# Patient Record
Sex: Female | Born: 1950
Health system: Southern US, Community
[De-identification: ages and names within clinical notes are randomized; demographics above are authoritative.]

## PROBLEM LIST (undated history)

## (undated) DIAGNOSIS — F329 Major depressive disorder, single episode, unspecified: Secondary | ICD-10-CM

## (undated) DIAGNOSIS — G610 Guillain-Barre syndrome: Secondary | ICD-10-CM

## (undated) DIAGNOSIS — M1990 Unspecified osteoarthritis, unspecified site: Secondary | ICD-10-CM

## (undated) DIAGNOSIS — Z860100 Personal history of colon polyps, unspecified: Secondary | ICD-10-CM

## (undated) DIAGNOSIS — R3989 Other symptoms and signs involving the genitourinary system: Secondary | ICD-10-CM

## (undated) DIAGNOSIS — M545 Low back pain, unspecified: Secondary | ICD-10-CM

## (undated) DIAGNOSIS — I10 Essential (primary) hypertension: Secondary | ICD-10-CM

## (undated) DIAGNOSIS — R102 Pelvic and perineal pain: Secondary | ICD-10-CM

## (undated) DIAGNOSIS — E785 Hyperlipidemia, unspecified: Secondary | ICD-10-CM

## (undated) DIAGNOSIS — K5730 Diverticulosis of large intestine without perforation or abscess without bleeding: Secondary | ICD-10-CM

## (undated) DIAGNOSIS — K649 Unspecified hemorrhoids: Secondary | ICD-10-CM

## (undated) DIAGNOSIS — F419 Anxiety disorder, unspecified: Secondary | ICD-10-CM

## (undated) DIAGNOSIS — Z8659 Personal history of other mental and behavioral disorders: Secondary | ICD-10-CM

## (undated) DIAGNOSIS — N3020 Other chronic cystitis without hematuria: Secondary | ICD-10-CM

## (undated) DIAGNOSIS — Z8742 Personal history of other diseases of the female genital tract: Secondary | ICD-10-CM

## (undated) DIAGNOSIS — Z973 Presence of spectacles and contact lenses: Secondary | ICD-10-CM

## (undated) DIAGNOSIS — M8580 Other specified disorders of bone density and structure, unspecified site: Secondary | ICD-10-CM

## (undated) DIAGNOSIS — G8929 Other chronic pain: Secondary | ICD-10-CM

## (undated) DIAGNOSIS — F32A Depression, unspecified: Secondary | ICD-10-CM

## (undated) HISTORY — PX: OTHER SURGICAL HISTORY: SHX169

## (undated) HISTORY — DX: Other specified disorders of bone density and structure, unspecified site: M85.80

## (undated) HISTORY — DX: Depression, unspecified: F32.A

## (undated) HISTORY — DX: Hyperlipidemia, unspecified: E78.5

## (undated) HISTORY — DX: Major depressive disorder, single episode, unspecified: F32.9

## (undated) HISTORY — PX: CARPAL TUNNEL RELEASE: SHX101

## (undated) HISTORY — PX: TUBAL LIGATION: SHX77

## (undated) HISTORY — PX: BUNIONECTOMY: SHX129

---

## 1999-03-13 ENCOUNTER — Other Ambulatory Visit: Admission: RE | Admit: 1999-03-13 | Discharge: 1999-03-13 | Payer: Self-pay | Admitting: Obstetrics & Gynecology

## 2000-02-07 ENCOUNTER — Encounter: Admission: RE | Admit: 2000-02-07 | Discharge: 2000-03-06 | Payer: Self-pay | Admitting: Anesthesiology

## 2000-03-25 ENCOUNTER — Other Ambulatory Visit: Admission: RE | Admit: 2000-03-25 | Discharge: 2000-03-25 | Payer: Self-pay | Admitting: Obstetrics & Gynecology

## 2000-08-04 ENCOUNTER — Encounter: Admission: RE | Admit: 2000-08-04 | Discharge: 2000-11-02 | Payer: Self-pay | Admitting: Anesthesiology

## 2000-09-16 ENCOUNTER — Encounter: Admission: RE | Admit: 2000-09-16 | Discharge: 2000-12-15 | Payer: Self-pay | Admitting: Specialist

## 2001-01-01 ENCOUNTER — Encounter: Admission: RE | Admit: 2001-01-01 | Discharge: 2001-04-01 | Payer: Self-pay | Admitting: Anesthesiology

## 2001-04-23 ENCOUNTER — Encounter: Admission: RE | Admit: 2001-04-23 | Discharge: 2001-07-14 | Payer: Self-pay | Admitting: Anesthesiology

## 2001-07-26 ENCOUNTER — Encounter: Admission: RE | Admit: 2001-07-26 | Discharge: 2001-08-14 | Payer: Self-pay | Admitting: Anesthesiology

## 2002-09-01 ENCOUNTER — Other Ambulatory Visit: Admission: RE | Admit: 2002-09-01 | Discharge: 2002-09-01 | Payer: Self-pay | Admitting: Obstetrics & Gynecology

## 2003-09-12 ENCOUNTER — Other Ambulatory Visit: Admission: RE | Admit: 2003-09-12 | Discharge: 2003-09-12 | Payer: Self-pay | Admitting: Obstetrics & Gynecology

## 2004-09-24 ENCOUNTER — Other Ambulatory Visit: Admission: RE | Admit: 2004-09-24 | Discharge: 2004-09-24 | Payer: Self-pay | Admitting: Obstetrics & Gynecology

## 2004-11-01 ENCOUNTER — Encounter (INDEPENDENT_AMBULATORY_CARE_PROVIDER_SITE_OTHER): Payer: Self-pay | Admitting: *Deleted

## 2004-11-01 ENCOUNTER — Ambulatory Visit (HOSPITAL_COMMUNITY): Admission: RE | Admit: 2004-11-01 | Discharge: 2004-11-01 | Payer: Self-pay | Admitting: Obstetrics & Gynecology

## 2004-11-01 HISTORY — PX: HYSTEROSCOPY WITH D & C: SHX1775

## 2005-04-17 ENCOUNTER — Ambulatory Visit: Payer: Self-pay | Admitting: Internal Medicine

## 2005-10-22 ENCOUNTER — Other Ambulatory Visit: Admission: RE | Admit: 2005-10-22 | Discharge: 2005-10-22 | Payer: Self-pay | Admitting: Obstetrics & Gynecology

## 2006-02-18 ENCOUNTER — Ambulatory Visit: Payer: Self-pay | Admitting: Internal Medicine

## 2006-03-19 ENCOUNTER — Ambulatory Visit: Payer: Self-pay | Admitting: Internal Medicine

## 2006-04-20 ENCOUNTER — Ambulatory Visit: Payer: Self-pay | Admitting: Internal Medicine

## 2006-04-24 ENCOUNTER — Ambulatory Visit: Payer: Self-pay | Admitting: Internal Medicine

## 2006-11-27 ENCOUNTER — Encounter: Admission: RE | Admit: 2006-11-27 | Discharge: 2006-11-27 | Payer: Self-pay | Admitting: Obstetrics & Gynecology

## 2006-12-10 ENCOUNTER — Ambulatory Visit: Payer: Self-pay | Admitting: Internal Medicine

## 2006-12-29 ENCOUNTER — Ambulatory Visit: Payer: Self-pay | Admitting: Internal Medicine

## 2006-12-29 LAB — CONVERTED CEMR LAB
Albumin: 3.9 g/dL (ref 3.5–5.2)
BUN: 13 mg/dL (ref 6–23)
Bilirubin Urine: NEGATIVE
CO2: 31 meq/L (ref 19–32)
Chloride: 101 meq/L (ref 96–112)
Cholesterol: 202 mg/dL (ref 0–200)
Creatinine, Ser: 1 mg/dL (ref 0.4–1.2)
Eosinophil percent: 2.3 % (ref 0.0–5.0)
GFR calc non Af Amer: 61 mL/min
HCT: 42.7 % (ref 36.0–46.0)
HDL: 62.2 mg/dL (ref >39.0)
Hemoglobin, Urine: NEGATIVE
Hemoglobin: 14.1 g/dL (ref 12.0–15.0)
LDL DIRECT: 96.2 mg/dL
Leukocytes, UA: NEGATIVE
MCHC: 33.1 g/dL (ref 30.0–36.0)
MCV: 85.1 fL (ref 78.0–100.0)
Monocytes Relative: 5.8 % (ref 3.0–11.0)
Neutro Abs: 3.6 10*3/uL (ref 1.4–7.7)
Neutrophils Relative %: 63.3 % (ref 43.0–77.0)
RDW: 13 % (ref 11.5–14.6)
Specific Gravity, Urine: 1.01 (ref 1.000–1.03)
Total Bilirubin: 0.8 mg/dL (ref 0.3–1.2)

## 2007-09-16 ENCOUNTER — Ambulatory Visit: Payer: Self-pay | Admitting: Internal Medicine

## 2007-10-05 ENCOUNTER — Ambulatory Visit: Payer: Self-pay | Admitting: Internal Medicine

## 2007-10-05 DIAGNOSIS — M949 Disorder of cartilage, unspecified: Secondary | ICD-10-CM

## 2007-10-05 DIAGNOSIS — M899 Disorder of bone, unspecified: Secondary | ICD-10-CM | POA: Insufficient documentation

## 2007-10-05 DIAGNOSIS — Z9189 Other specified personal risk factors, not elsewhere classified: Secondary | ICD-10-CM | POA: Insufficient documentation

## 2007-10-05 DIAGNOSIS — I059 Rheumatic mitral valve disease, unspecified: Secondary | ICD-10-CM | POA: Insufficient documentation

## 2007-10-05 DIAGNOSIS — G5600 Carpal tunnel syndrome, unspecified upper limb: Secondary | ICD-10-CM

## 2007-10-18 ENCOUNTER — Telehealth: Payer: Self-pay | Admitting: Internal Medicine

## 2008-02-07 ENCOUNTER — Ambulatory Visit: Payer: Self-pay | Admitting: Internal Medicine

## 2008-02-07 DIAGNOSIS — F329 Major depressive disorder, single episode, unspecified: Secondary | ICD-10-CM

## 2008-02-07 DIAGNOSIS — R439 Unspecified disturbances of smell and taste: Secondary | ICD-10-CM

## 2008-02-07 DIAGNOSIS — R51 Headache: Secondary | ICD-10-CM

## 2008-02-07 DIAGNOSIS — R519 Headache, unspecified: Secondary | ICD-10-CM | POA: Insufficient documentation

## 2008-02-07 DIAGNOSIS — T887XXA Unspecified adverse effect of drug or medicament, initial encounter: Secondary | ICD-10-CM

## 2008-03-01 ENCOUNTER — Telehealth (INDEPENDENT_AMBULATORY_CARE_PROVIDER_SITE_OTHER): Payer: Self-pay | Admitting: *Deleted

## 2008-07-25 ENCOUNTER — Telehealth (INDEPENDENT_AMBULATORY_CARE_PROVIDER_SITE_OTHER): Payer: Self-pay | Admitting: *Deleted

## 2008-08-25 ENCOUNTER — Ambulatory Visit: Payer: Self-pay | Admitting: Internal Medicine

## 2008-11-22 ENCOUNTER — Ambulatory Visit: Payer: Self-pay | Admitting: Internal Medicine

## 2008-11-22 DIAGNOSIS — R7309 Other abnormal glucose: Secondary | ICD-10-CM | POA: Insufficient documentation

## 2008-11-23 ENCOUNTER — Encounter (INDEPENDENT_AMBULATORY_CARE_PROVIDER_SITE_OTHER): Payer: Self-pay | Admitting: *Deleted

## 2008-11-23 ENCOUNTER — Telehealth: Payer: Self-pay | Admitting: Internal Medicine

## 2008-11-27 ENCOUNTER — Encounter (INDEPENDENT_AMBULATORY_CARE_PROVIDER_SITE_OTHER): Payer: Self-pay | Admitting: *Deleted

## 2009-04-24 ENCOUNTER — Ambulatory Visit: Payer: Self-pay | Admitting: Internal Medicine

## 2009-05-11 ENCOUNTER — Ambulatory Visit: Payer: Self-pay | Admitting: Internal Medicine

## 2009-05-25 ENCOUNTER — Ambulatory Visit: Payer: Self-pay | Admitting: Internal Medicine

## 2009-05-25 ENCOUNTER — Encounter: Payer: Self-pay | Admitting: Internal Medicine

## 2009-05-28 ENCOUNTER — Encounter: Payer: Self-pay | Admitting: Internal Medicine

## 2009-09-24 HISTORY — PX: COLONOSCOPY W/ POLYPECTOMY: SHX1380

## 2009-11-02 ENCOUNTER — Telehealth (INDEPENDENT_AMBULATORY_CARE_PROVIDER_SITE_OTHER): Payer: Self-pay | Admitting: *Deleted

## 2009-12-05 ENCOUNTER — Ambulatory Visit: Payer: Self-pay | Admitting: Internal Medicine

## 2009-12-05 LAB — CONVERTED CEMR LAB
Bilirubin Urine: NEGATIVE
Blood in Urine, dipstick: NEGATIVE
Glucose, Urine, Semiquant: NEGATIVE
Protein, U semiquant: NEGATIVE
WBC Urine, dipstick: NEGATIVE
pH: 6

## 2009-12-10 ENCOUNTER — Encounter (INDEPENDENT_AMBULATORY_CARE_PROVIDER_SITE_OTHER): Payer: Self-pay | Admitting: *Deleted

## 2009-12-10 LAB — CONVERTED CEMR LAB
AST: 17 units/L (ref 0–37)
Albumin: 3.7 g/dL (ref 3.5–5.2)
BUN: 11 mg/dL (ref 6–23)
Basophils Relative: 0.7 % (ref 0.0–3.0)
CO2: 30 meq/L (ref 19–32)
Calcium: 9 mg/dL (ref 8.4–10.5)
Direct LDL: 80.5 mg/dL
Eosinophils Relative: 1.7 % (ref 0.0–5.0)
GFR calc non Af Amer: 68.57 mL/min (ref >60)
Glucose, Bld: 92 mg/dL (ref 70–99)
HCT: 41.3 % (ref 36.0–46.0)
Hgb A1c MFr Bld: 5.5 % (ref 4.6–6.5)
Lymphs Abs: 1.7 10*3/uL (ref 0.7–4.0)
MCV: 86.7 fL (ref 78.0–100.0)
Monocytes Absolute: 0.3 10*3/uL (ref 0.1–1.0)
Platelets: 239 10*3/uL (ref 150.0–400.0)
RBC: 4.76 M/uL (ref 3.87–5.11)
TSH: 0.76 microintl units/mL (ref 0.35–5.50)
Total Protein: 6.6 g/dL (ref 6.0–8.3)
Triglycerides: 225 mg/dL — ABNORMAL HIGH (ref 0.0–149.0)
WBC: 5.5 10*3/uL (ref 4.5–10.5)

## 2009-12-13 ENCOUNTER — Ambulatory Visit: Payer: Self-pay | Admitting: Internal Medicine

## 2009-12-13 DIAGNOSIS — E781 Pure hyperglyceridemia: Secondary | ICD-10-CM | POA: Insufficient documentation

## 2009-12-13 DIAGNOSIS — Z860100 Personal history of colon polyps, unspecified: Secondary | ICD-10-CM | POA: Insufficient documentation

## 2010-12-10 ENCOUNTER — Encounter: Payer: Self-pay | Admitting: Internal Medicine

## 2010-12-10 ENCOUNTER — Ambulatory Visit: Payer: Self-pay | Admitting: Internal Medicine

## 2010-12-10 DIAGNOSIS — R109 Unspecified abdominal pain: Secondary | ICD-10-CM | POA: Insufficient documentation

## 2010-12-10 DIAGNOSIS — E785 Hyperlipidemia, unspecified: Secondary | ICD-10-CM | POA: Insufficient documentation

## 2010-12-11 LAB — CONVERTED CEMR LAB
ALT: 15 units/L (ref 0–35)
BUN: 15 mg/dL (ref 6–23)
Bilirubin, Direct: 0 mg/dL (ref 0.0–0.3)
Chloride: 103 meq/L (ref 96–112)
Cholesterol: 228 mg/dL — ABNORMAL HIGH (ref 0–200)
Direct LDL: 134.6 mg/dL
Eosinophils Absolute: 0.2 10*3/uL (ref 0.0–0.7)
Eosinophils Relative: 3.3 % (ref 0.0–5.0)
GFR calc non Af Amer: 57.19 mL/min — ABNORMAL LOW (ref >60.00)
HCT: 42.5 % (ref 36.0–46.0)
Lymphs Abs: 2.2 10*3/uL (ref 0.7–4.0)
MCV: 82.2 fL (ref 78.0–100.0)
Monocytes Absolute: 0.5 10*3/uL (ref 0.1–1.0)
Platelets: 246 10*3/uL (ref 150.0–400.0)
Potassium: 5.3 meq/L — ABNORMAL HIGH (ref 3.5–5.1)
RBC: 5.16 M/uL — ABNORMAL HIGH (ref 3.87–5.11)
Sodium: 139 meq/L (ref 135–145)
TSH: 1.76 microintl units/mL (ref 0.35–5.50)
Total Bilirubin: 0.5 mg/dL (ref 0.3–1.2)
Total CHOL/HDL Ratio: 3
Total Protein: 6.7 g/dL (ref 6.0–8.3)
VLDL: 21.2 mg/dL (ref 0.0–40.0)
WBC: 5.7 10*3/uL (ref 4.5–10.5)

## 2010-12-17 ENCOUNTER — Telehealth: Payer: Self-pay | Admitting: Internal Medicine

## 2010-12-18 ENCOUNTER — Telehealth: Payer: Self-pay | Admitting: Internal Medicine

## 2011-01-12 LAB — CONVERTED CEMR LAB
ALT: 15 units/L (ref 0–35)
AST: 19 units/L (ref 0–37)
Alkaline Phosphatase: 46 units/L (ref 39–117)
Basophils Absolute: 0 10*3/uL (ref 0.0–0.1)
Bilirubin, Direct: 0.1 mg/dL (ref 0.0–0.3)
CO2: 30 meq/L (ref 19–32)
Cholesterol: 188 mg/dL (ref 0–200)
Direct LDL: 77.7 mg/dL
Glucose, Bld: 99 mg/dL (ref 70–99)
HDL: 59.3 mg/dL (ref >39.0)
Lymphocytes Relative: 31.6 % (ref 12.0–46.0)
Monocytes Absolute: 0.3 10*3/uL (ref 0.1–1.0)
Monocytes Relative: 6 % (ref 3.0–12.0)
Platelets: 235 10*3/uL (ref 150–400)
Potassium: 3.7 meq/L (ref 3.5–5.1)
RDW: 13.1 % (ref 11.5–14.6)
Sodium: 142 meq/L (ref 135–145)
Total Bilirubin: 0.5 mg/dL (ref 0.3–1.2)
Total CHOL/HDL Ratio: 3.2
Total Protein: 6.9 g/dL (ref 6.0–8.3)
Triglycerides: 219 mg/dL (ref 0–149)
VLDL: 44 mg/dL — ABNORMAL HIGH (ref 0–40)
Vit D, 1,25-Dihydroxy: 54 (ref 30–89)

## 2011-01-16 NOTE — Assessment & Plan Note (Signed)
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 Abdominal Pain      This is a 60 year old woman who also  presents with Abdominal pain intermittently , lasting hours for  past 6 months.  The patient denies nausea, vomiting, diarrhea, bloating,constipation, melena, hematochezia, and hematemesis.  The location of the pain is suprapubic.  The pain is described as intermittent and cramping in quality.  The patient denies the following symptoms: fever, weight loss, dysuria, jaundice, dark urine, and vaginal bleeding.  The pain has no triggers or relievers. colonoscopy 2010.  Current Medications (verified): 1)  Wellbutrin Xl 150 Mg  Tb24 (Bupropion Hcl) .Marland Kitchen.. 1 By Mouth Qd 2)  Valium 10 Mg  Tabs (Diazepam) .Marland Kitchen.. 1 By Mouth Qd 3)  Neurontin 100 Mg  Caps (Gabapentin) .Marland Kitchen.. 1-3 Q 8 Hrs As Needed Headache 4)  Axert 6.25 Mg Tabs (Almotriptan Malate) .Marland Kitchen.. 1 As Needed Migraine , May Repeat 2 Hrs Later If Needed  Allergies (verified): No Known Drug Allergies  Past History:  Past Medical History: Headache, Migraine, PMH of  Anosmia, Dr Suzanna Obey, ENT  Osteopenia 2007 Depression, PMH of  MVP on 2D ECHO Colonic polyps, PMH  of 2010, Dr  Yancey Flemings Hyperlipidemia :elevated Triglycerides  Past Surgical History: Tubal ligation; Bladder surgery; Bunionectomy; DDD, S/P nerve block LS spine ; CTS surgery RUE 2005; G 1 P 1 Colon polypectomy 2010, Dr  Yancey Flemings ( due 2015)  Family History: Father: MI @  29 Mother:HTN, dementia, lung cancer, Osteoporosis Siblings: bro: PTE,HTN; MGM: MI in 80s;MGF: lung cancer , MI @83  ; M aunt :DM; M uncle :renal  cancer   Social History: Occupation: laid off; 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         Weight up 103 IN PAST YEAR. MS:  Complains of joint pain; Trauma to R knee late Nov with effusion. Chronic LBP.  Physical Exam  General:  well-nourished,in no acute distress; alert,appropriate and cooperative throughout examination Head:  Normocephalic and atraumatic without obvious abnormalities.  Eyes:  No corneal or conjunctival inflammation noted. EOMI. Perrla. Funduscopic exam benign, without hemorrhages, exudates or papilledema.No icterus Ears:  External ear exam shows no significant lesions or deformities.  Otoscopic examination reveals clear canals, tympanic membranes are intact bilaterally without bulging, retraction, inflammation or discharge. Hearing is grossly normal bilaterally. Nose:  External nasal examination shows no deformity or inflammation. Nasal mucosa are pink and moist without lesions or exudates. Mouth:  Oral mucosa and oropharynx without lesions or exudates.  Teeth in good repair. No pharyngeal erythema.   Neck:  No deformities, masses, or tenderness noted. Lungs:  Normal respiratory effort, chest expands symmetrically. Lungs are clear to auscultation, no crackles or wheezes. Heart:  normal rate, regular rhythm, no gallop, no rub, no JVD, no HJR, and grade 1/2-1 /6 systolic murmur LSB.   Abdomen:  Bowel sounds positive,abdomen soft and non-tender without masses,  organomegaly or hernias noted. Rectal:  given stool cards Genitalia:  Dr Jennette Kettle  seen annually Msk:  No deformity or scoliosis noted of thoracic or lumbar spine.   Pulses:  R and L carotid,radial,dorsalis pedis and posterior tibial pulses are full and equal bilaterally Extremities:  No clubbing, cyanosis, edema, or deformity noted with normal full range of motion of all joints.  Mild crepitus of knees; no effusion  Neurologic:  alert & oriented X3 and DTRs symmetrical and normal.  R knee not checked  Skin:  Intact without suspicious lesions or rashes Cervical Nodes:  No lymphadenopathy noted Axillary Nodes:  No palpable lymphadenopathy Psych:  memory intact for recent and remote, normally interactive, and good eye contact.     Impression & Recommendations:  Problem # 1:  ROUTINE GENERAL MEDICAL EXAM@HEALTH  CARE FACL (ICD-V70.0)  Orders: EKG w/ Interpretation (93000) Venipuncture (16109) TLB-Lipid Panel (80061-LIPID) TLB-BMP (Basic Metabolic Panel-BMET) (80048-METABOL) TLB-CBC Platelet - w/Differential (85025-CBCD) TLB-Hepatic/Liver Function Pnl (80076-HEPATIC) TLB-TSH (Thyroid Stimulating Hormone) (84443-TSH)  Problem # 2:  ABDOMINAL PAIN, SUPRAPUBIC (ICD-789.09) probable IBS in context of family health issues  Problem # 3:  HEADACHE, CHRONIC (ICD-784.0)  Her updated medication list for this problem includes:    Axert 6.25 Mg Tabs (Almotriptan malate) .Marland Kitchen... 1 as needed migraine , may repeat 2 hrs later if needed  Complete Medication List: 1)  Wellbutrin Xl 150 Mg Tb24 (Bupropion hcl) .Marland Kitchen.. 1 by mouth qd 2)  Valium 10 Mg Tabs (Diazepam) .Marland Kitchen.. 1 by mouth qd 3)  Neurontin 100 Mg Caps (Gabapentin) .Marland Kitchen.. 1-3 q 8 hrs as needed headache 4)  Axert 6.25 Mg Tabs (Almotriptan malate) .Marland Kitchen.. 1 as needed migraine , may repeat 2 hrs later if needed 5)  Oscimin 0.125 Mg Subl (Hyoscyamine sulfate) .Marland Kitchen.. 1 under tongue every 6 hrs as needed for abd pain  Patient Instructions: 1)  Consume LESS THAN  30 grams of High Fructose Corn Syrup sugar / day. Complete stool cards. Prescriptions: OSCIMIN 0.125 MG SUBL (HYOSCYAMINE SULFATE) 1 under tongue every 6 hrs as needed for abd pain  #30 x 1   Entered and Authorized by:   Marga Melnick MD   Signed by:   Marga Melnick MD on 12/10/2010   Method used:   Print then Give to Patient   RxID:   [id number] AXERT 6.25 MG TABS (ALMOTRIPTAN MALATE) 1 as needed migraine , may repeat 2 hrs later if needed  #6 x 2   Entered and Authorized by:   Marga Melnick MD   Signed by:   Marga Melnick MD on 12/10/2010   Method used:   Print then Give to Patient   RxID:   [id number]    Orders Added: 1)  Est. Patient 40-64 years [99396] 2)  EKG w/ Interpretation [93000] 3)  Venipuncture [36415] 4)  TLB-Lipid Panel [80061-LIPID] 5)  TLB-BMP (Basic Metabolic Panel-BMET) [80048-METABOL] 6)  TLB-CBC Platelet - w/Differential [85025-CBCD] 7)  TLB-Hepatic/Liver Function Pnl [80076-HEPATIC] 8)  TLB-TSH (Thyroid Stimulating Hormone) [29528-UXL]

## 2011-01-16 NOTE — Progress Notes (Signed)
Summary: Axert alternative  Phone Note Call from Patient Call back at [phone]   Summary of Call: Patient left message on triage that the med she was given, Azer, is $150 for qty of 6. She needs it changed to cheaper alternative/generic. She notes that she has previously tried Amitriptyline or Imitrex and the 100mg  dose did not work for her.  Please advise. Initial call taken by: Lucious Groves CMA,  December 17, 2010 4:22 PM  Follow-up for Phone Call        she needs to determine preferred Tryptan for her insurance company as of 01/02; they change @ least annually based on cost to them. She needs ti=o alert them about the Imitrex  response Follow-up by: Marga Melnick MD,  December 17, 2010 5:38 PM  Additional Follow-up for Phone Call Additional follow up Details #1::        Patient notified of the above and will call back after she talks with the insurance company.  Additional Follow-up by: Lucious Groves CMA,  December 18, 2010 10:23 AM

## 2011-01-16 NOTE — Progress Notes (Signed)
Summary: Axert alternative  Phone Note Call from Patient Call back at Home Phone [phone]   Summary of Call: Patient left message on triage that she has spoken with her insurance company and her insurance will cover Frova 2.5mg , Maxalt both 5 and 10mg , and Zomig 2.5mg . Please advise of Axert alternative. Initial call taken by: Lucious Groves CMA,  December 18, 2010 2:32 PM  Follow-up for Phone Call        Maxalt 10 mg  # 6 or # 9 as allowed by her insurance Follow-up by: Marga Melnick MD,  December 18, 2010 5:40 PM  Additional Follow-up for Phone Call Additional follow up Details #1::        Lm with spouse to call back to office. Lucious Groves CMA  December 19, 2010 8:59 AM   Patient notified. Lucious Groves CMA  December 19, 2010 10:50 AM    New Allergies: ! IMITREX (SUMATRIPTAN SUCCINATE) New/Updated Medications: MAXALT 10 MG TABS (RIZATRIPTAN BENZOATE) 1 as needed migraines New Allergies: ! IMITREX (SUMATRIPTAN SUCCINATE)Prescriptions: MAXALT 10 MG TABS (RIZATRIPTAN BENZOATE) 1 as needed migraines  #9 x 1   Entered by:   Lucious Groves CMA   Authorized by:   Marga Melnick MD   Signed by:   Lucious Groves CMA on 12/19/2010   Method used:   Faxed to ...       [facility] [phone]* (retail)       [address]       [address]       [place], [place]  [address]       Ph: [phone]       Fax: [phone]   RxID:   [id number] MAXALT 10 MG TABS (RIZATRIPTAN BENZOATE) 1 as needed migraines  #9 x 1   Entered and Authorized by:   Marga Melnick MD   Signed by:   Marga Melnick MD on 12/18/2010   Method used:   Print then Mail to Patient   RxID:   [id number]

## 2011-02-21 ENCOUNTER — Ambulatory Visit (INDEPENDENT_AMBULATORY_CARE_PROVIDER_SITE_OTHER): Payer: 59 | Admitting: Internal Medicine

## 2011-02-21 ENCOUNTER — Encounter: Payer: Self-pay | Admitting: Internal Medicine

## 2011-02-21 DIAGNOSIS — J019 Acute sinusitis, unspecified: Secondary | ICD-10-CM

## 2011-02-25 NOTE — Assessment & Plan Note (Signed)
Summary: congested/cbs   Vital Signs:  Patient profile:   60 year old female Weight:      159.6 pounds BMI:     28.60 Temp:     98.1 degrees F oral Pulse rate:   72 / minute Resp:     15 per minute BP sitting:   116 / 68  (left arm) Cuff size:   large  Vitals Entered By: Shonna Chock CMA (February 21, 2011 2:24 PM) CC: Sinus infection x 1 1/2 week(s) , URI symptoms   CC:  Sinus infection x 1 1/2 week(s)  and URI symptoms.  History of Present Illness:    Onset 10 days ago as rash R submandibular area followed by frontal headache. She now  reports nasal congestion, but denies purulent nasal discharge, productive cough, and earache.  The patient denies fever, dyspnea, and wheezing.  The patient also reports frontal  headache.  Risk factors for Strep sinusitis include bilateral facial pain.  The patient denies the following risk factors for Strep sinusitis: tooth pain, Strep exposure, and tender adenopathy.  Rx: Neti pot; OTC Sudafed  Current Medications (verified): 1)  Wellbutrin Xl 150 Mg  Tb24 (Bupropion Hcl) .Marland Kitchen.. 1 By Mouth Qd 2)  Valium 10 Mg  Tabs (Diazepam) .Marland Kitchen.. 1 By Mouth Qd 3)  Neurontin 100 Mg  Caps (Gabapentin) .Marland Kitchen.. 1-3 Q 8 Hrs As Needed Headache 4)  Oscimin 0.125 Mg Subl (Hyoscyamine Sulfate) .Marland Kitchen.. 1 Under Tongue Every 6 Hrs As Needed For Abd Pain 5)  Maxalt 10 Mg Tabs (Rizatriptan Benzoate) .Marland Kitchen.. 1 As Needed Migraines  Allergies: 1)  ! Imitrex (Sumatriptan Succinate)  Physical Exam  General:  well-nourished,in no acute distress; alert,appropriate and cooperative throughout examination Ears:  External ear exam shows no significant lesions or deformities.  Otoscopic examination reveals clear canals, tympanic membranes are intact bilaterally without bulging, retraction, inflammation or discharge. Hearing is grossly normal bilaterally. Nose:  External nasal examination shows no deformity or inflammation. Nasal mucosa are  dry without lesions or exudates. Mouth:  Oral mucosa and  oropharynx without lesions or exudates.  Teeth in good repair. Tiny uvula  Lungs:  Normal respiratory effort, chest expands symmetrically. Lungs are clear to auscultation, no crackles or wheezes. Cervical Nodes:  No lymphadenopathy noted Axillary Nodes:  No palpable lymphadenopathy   Impression & Recommendations:  Problem # 1:  SINUSITIS- ACUTE-NOS (ICD-461.9)  Her updated medication list for this problem includes:    Amoxicillin 500 Mg Caps (Amoxicillin) .Marland Kitchen... 1 three times a day    Fluticasone Propionate 50 Mcg/act Susp (Fluticasone propionate) .Marland Kitchen... 1 spray two times a day as needed  Complete Medication List: 1)  Wellbutrin Xl 150 Mg Tb24 (Bupropion hcl) .Marland Kitchen.. 1 by mouth qd 2)  Valium 10 Mg Tabs (Diazepam) .Marland Kitchen.. 1 by mouth qd 3)  Neurontin 100 Mg Caps (Gabapentin) .Marland Kitchen.. 1-3 q 8 hrs as needed headache 4)  Oscimin 0.125 Mg Subl (Hyoscyamine sulfate) .Marland Kitchen.. 1 under tongue every 6 hrs as needed for abd pain 5)  Maxalt 10 Mg Tabs (Rizatriptan benzoate) .Marland Kitchen.. 1 as needed migraines 6)  Amoxicillin 500 Mg Caps (Amoxicillin) .Marland Kitchen.. 1 three times a day 7)  Fluticasone Propionate 50 Mcg/act Susp (Fluticasone propionate) .Marland Kitchen.. 1 spray two times a day as needed  Patient Instructions: 1)  Avoid Sudafed.Neti pot once daily - two times a day as needed . 2)  Drink as much  NON dairy fluid as you can tolerate for the next few days. Prescriptions: FLUTICASONE PROPIONATE 50 MCG/ACT  SUSP (FLUTICASONE PROPIONATE) 1 spray two times a day as needed  #1 x 5   Entered and Authorized by:   Marga Melnick MD   Signed by:   Marga Melnick MD on 02/21/2011   Method used:   Electronically to        [facility] [address] [phone]* (retail)       [address]       [address]       [place], [place]  [address]       Ph: [phone]       Fax: [phone]   RxID:   [id number] AMOXICILLIN 500 MG CAPS (AMOXICILLIN) 1 three times a day  #30 x 0   Entered and Authorized by:   Marga Melnick MD   Signed by:    Marga Melnick MD on 02/21/2011   Method used:   Electronically to        [facility] [address] [phone]* (retail)       [address]       [address]       [place], [place]  [address]       Ph: [phone]       Fax: [phone]   RxID:   [id number]    Orders Added: 1)  Est. Patient Level III [22025]

## 2011-05-02 NOTE — Op Note (Signed)
NAME:  Joanne Morrison, Joanne Morrison              ACCOUNT NO.:  [id number]   MEDICAL RECORD NO.:  [id number]          PATIENT TYPE:  AMB   LOCATION:  SDC                           FACILITY:  [facility]   PHYSICIAN:  Freddy Finner, M.D.   DATE OF BIRTH:  [birth year]   DATE OF PROCEDURE:  11/01/2004  DATE OF DISCHARGE:                                 OPERATIVE REPORT   PREOPERATIVE DIAGNOSIS:  Postmenopausal bleeding, endometrial polyp.   POSTOPERATIVE DIAGNOSIS:  Postmenopausal bleeding, endometrial polyp with  questionable small polyp noted on lower uterine segment.   OPERATIVE PROCEDURE:  Hysteroscopy, dilatation and curettage, resection of  polyps.   SURGEON:  Freddy Finner, M.D.   ANESTHESIA:  Intravenous sedation, paracervical block.   INTRAOPERATIVE COMPLICATIONS:  None.   SORBITOL DEFICIT:  60 cc.   ESTIMATED INTRAOPERATIVE BLOOD LOSS:  Less than 10 cc.   Patient is a 60 year old who is on Prempro for hormone replacement therapy  and has had postmenopausal bleeding with this therapy.  Sonohystogram in  late October of this year showed a 5 mm polypoid mass within the endometrial  cavity.  There were intramural leiomyomata, one measuring 2 cm and one 1.7.  She was admitted on the morning of surgery for a hysteroscopy and D&C.  She  was brought to the operating room after receiving a bolus of antibiotic IV.  She was placed under adequate intravenous sedation and placed in the dorsal  lithotomy position.  Paracervical block was placed after grasping the  anterior cervical lip with a single-tooth tenaculum.  The uterus sounded to  approximately 8 cm.  The cervix was progressively dilated to 23 with [facility]  dilators.  A 12.5 degree [facility] hysteroscope is introduced using 3% sorbitol  as a distending medium.  Inspection revealed adequate placement.  There is  no apparent abnormality of the endometrial cavity except a questionable  polyp on the patient's right in the upper fundus.  Then further  curettage  was carried out followed by expression with Randall stone forceps for repeat  evacuation and aspiration.  Repeat inspection with the hysteroscope  __________ with sampling in the endometrium.  The procedure was terminated.  Instruments were removed.  The patient was awakened and taken to the  recovery room in good condition.  She will be discharged with routine  outpatient surgical instructions with followup in the office in  approximately one week.     Hosie Spangle   WRN/MEDQ  D:  11/01/2004  T:  11/01/2004  Job:  [id number]

## 2011-05-02 NOTE — Procedures (Signed)
[facility]  Patient:    Joanne Morrison, Joanne Morrison               MRN: [id number] Proc. Date: 09/16/00 Adm. Date:  [id number] Attending:  Thyra Breed CC:         Kerrin Champagne, M.D.  Titus Dubin. Alwyn Ren, M.D. [facility]   Procedure Report  PROCEDURE:  Lumbar facet joint ______ at L3, 4 and 5.  DIAGNOSIS:  Lumbar spondylosis with facet joint arthritis.  INTERVAL HISTORY:  The patient has had good responses to facet joint nerve blocks and wishes to proceed with ______ since there have been short lived improvements.  PHYSICAL EXAMINATION:  VITAL SIGNS:  Blood pressure 124/87, heart rate 82, respiratory rate 18, O2 saturations 96%, pain level 5/10.  NEUROLOGIC:  Unchanged from previously. She is tender on the right side of her lower lumbar facet joints.  DESCRIPTION OF PROCEDURE:  After informed consent was obtained, the patient was placed in the prone position and monitored. A pillow was placed under her abdomen. Lumbar vertebrae L1 through L5 were identified. I identified the junction of the superior articulating process and the transverse process at L3-4 and 5. The L5-S1 joint was fairly fused. I marked the skin and prepped out the skin overlying these processes. I used Betadine and swab x 3. The area was draped out. Using a 25 gauge needle, I anesthetized the skin and subcutaneous structures and with a 25 gauge spinal needle, deeper structures with a total of about 3 ml to 4 ml at each level of 1% lidocaine. A curved 20 gauge needle was inserted to the junction of the superior articulating process and transverse process at each level. The lateral view confirmed that these were posterior to the neuroforamen. The needles were optimized to give good impedance with L5 being 470, L4 being 387 and L3 being 302. Sensory stimulation at 50 hertz occurred at 0.59 at L5, at 0.78 at L4, and at 0.56 at L3. Motor stimulation was mild at L5 at 2.82, at L4 at 2.17, and  at 2 at L3. Strong motor stimulation and fasciculations were absent and there was no sensation going out into the lower extremities nor fasciculations. The needles were injected with 1% lidocaine 1 cc at each level and 30 seconds was allowed to pass. Each level was heated to 80 degrees for a duration of 60 seconds and the needles removed intact. Her back was cleansed free and she was taken to the recovery room where she was observed.  CONDITION POST PROCEDURE:  Stable.  DISCHARGE INSTRUCTIONS: 1. Resume previous diet. 2. Limitations in activities per instruction sheet as outlined by my    assistant. 3. I will go ahead and treat [name] with a taper or corticosteroids for the    next 10 days. 4. Follow-up with me in 4 weeks or on an as needed basis. DD:  09/16/00 TD:  09/17/00 Job: [id number]

## 2011-05-02 NOTE — H&P (Signed)
[facility]  Patient:    Joanne Morrison, Joanne Morrison               MRN: [id number] Adm. Date:  [id number] Disc. Date: [id number] Attending:  Thyra Breed CC:         Titus Dubin. Alwyn Ren, M.D. [facility]  Kerrin Champagne, M.D.   History and Physical  Joanne Morrison comes in for a followup evaluation.  HISTORY OF PRESENT ILLNESS:  She continues to note that her pain in the lower lumbar facet joint region has essentially been done away with, but now she has more of a buttock type discomfort on the right side.  It is helped by low dose OxyContin and the amitriptyline, but she feels as though she may be able to tolerate a higher dose of this.  She has recently stopped working, not because of her pain, but because of intolerances with the individuals at work.  She feels better as a result, and today was her last day there.  PHYSICAL EXAMINATION:  VITAL SIGNS:  Blood pressure 143/75, heart rate 115, respiratory rate 16, O2 saturations 96%, pain level is 5/10.  NEUROLOGIC:  Straight leg raise signs are negative.  Deep tendon reflexes are symmetric.  She has no tenderness over the facet joints, but continues to have some presacral tenderness over the right.  IMPRESSION: 1. Lower back discomfort which may or may not be related to a neuropathic type    pain. 2. Lumbar spondylosis, markedly improved. 3. Other medical problems per Dr. Alwyn Ren.  DISPOSITION: 1. Continue on low dose OxyContin at 10 mg one p.o. b.i.d. #60 with no    refills. 2. Increase amitriptyline to 25 mg one or two p.o. q.p.m. #60 with 2 refills. 3. Follow up with me in four weeks. DD:  10/30/00 TD:  10/31/00 Job: [id number]

## 2011-05-02 NOTE — H&P (Signed)
[facility]  Patient:    Joanne Morrison, Joanne Morrison                     MRN: [id number] Adm. Date:  [id number] Disc. Date: [id number] Attending:  Thyra Breed CC:         Kerrin Champagne, M.D.  Titus Dubin. Alwyn Ren, M.D. [facility]   History and Physical  FOLLOWUP EVALUATION  [name] comes in for followup evaluation of her chronic low back pain and ______ of lumbar spondylosis. Since her last evaluation she has done remarkably well. She states she has had minimal complaints. She is taking about one to two hydrocodone per day and continues on the Depakote and Valium. 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 She rates her pain at 4/10, localizes it to the lower back. She notes that lying or sitting down helps to reduce her discomfort. Standing for long periods of time increases her discomfort.  EXAMINATION:  Blood pressure 128/75, heart rate is 83, respiratory rate is 16, O2 saturations 94%. Pain level is 4/10. Straight leg raise signs are negative. Deep tendon reflexes are symmetric. Gait is intact.  IMPRESSION: 1. Lumbar spondylosis with facet joint syndrome--stable. 2. Anxiety disorder per Dr. Dub Amis. 3. Perimenopausal per Dr. Jennette Kettle.  DISPOSITION: 1. Continue on hydrocodone 5/500 one p.o. q.6h. p.r.n., #100, with no refill. 2. Continue Valium as needed. 3. Follow up with Dr. Vear Clock in three months again. She does not need a redo    RF lesioning of her lumbar regions as yet. DD:  07/26/01 TD:  07/26/01 Job: [id number]

## 2011-05-02 NOTE — H&P (Signed)
[facility]  Patient:    Joanne Morrison, Joanne Morrison               MRN: [id number] Adm. Date:  [id number] Attending:  Thyra Breed CC:         Titus Dubin. Alwyn Ren, M.D. [facility]   History and Physical  FOLLOW-UP EVALUATION  HISTORY OF PRESENT ILLNESS:  Joanne Morrison comes in for a follow-up evaluation today. She is doing fairly well overall and rates her pain at 3:10. She localizes her pain to the base of her spine and is made worse by stooping and trying to stand or leaning over, but in general, she does pretty good. She went out shopping with her sister recently and was able to walk around for about two hours before she had to take any of her pain medications. She has not used 100 tablets in eight weeks of the Hydrocodone which is a good sign. She is seeing Dr. Jodi Marble who has her on Depakote and Valium and Dr. Jennette Kettle who has got her started on Activella. She denies any new numbness or tingling, bowel or bladder incontinence or weakness.  PHYSICAL EXAMINATION:  VITAL SIGNS:  Blood pressure 130/69, heart rate 76, respiratory rate 18 and oxygen saturation 98%. Pain level is 3:10.  NEUROLOGICAL:  Deep tendon reflexes were symmetric in the lower extremities with negative straight leg raise signs. Forward flexion reduces her discomfort and hyperextension of her back increases her pain.  She has gotten [employer] book and is doing exercises for her lower back but stopped when she noted that the prone position put pressure on her breasts which increased her discomfort. I encouraged her to go ahead and pad her stomach in order to reduce some of the stress on her breasts.  IMPRESSION: 1. Lumbar spondylosis with facet joint syndrome, stable. 2. Tachycardia, resolved. 3. Anxiety disorder, per Dr. Jodi Marble. 4. Perimenopausal per Dr. Jennette Kettle.  DISPOSITION: 1. Continue on Hydrocodone 5/500 one p.o. q.6h. p.r.n., #100 with    no refill. 2. She was encouraged to restart  [employer]in [employer] exercises but with    modifications as discussed. 3. Follow-up with me in eight weeks. 4. She does not feel like she is ready for another facet block as of yet. DD:  03/01/01 TD:  03/01/01 Job: [id number]

## 2011-05-02 NOTE — H&P (Signed)
[facility]  Patient:    Joanne Morrison, Joanne Morrison               MRN: [id number] Adm. Date:  [id number] Attending:  Thyra Breed CC:         Titus Dubin. Alwyn Ren, M.D. [facility]   History and Physical  FOLLOW-UP EVALUATION  HISTORY:  The patient comes in for follow-up evaluation.  She continues to feel very positively about her radiofrequency lesioning of her facet joints, but she has developed some back discomfort, which she rates at a level of 3/10 with activity.  She was very intolerant of the Oxycontin.  It apparently caused tremendous mood swings, and she had to go off of this.  She is also not taking the amitriptyline, which we discontinued at her last visit.  She has been back to see Dr. Jodi Marble for an anxiety disorder and placed on Depakote and Valium, and apparently is doing remarkably well on this combination.  She notes that she is able to walk about an hour per day before her lower back begins to bother her fairly significantly, and she is not doing this a great deal presently.  She has been in pretty good shape in the past, and was doing Tae-Bo at one point, and is asking about whether she will ever be able to get back to this.  I advised her that we need to slowly condition her back, and I recommended that she get Bevere[employer] R[employer] books on back care to begin to go down this route.  We reviewed the need to go very slowly with exercise for the time being.  PHYSICAL EXAMINATION:  VITAL SIGNS:  Blood pressure 123/88, heart rate 102, respiratory rate 18, O2 saturations 100%, pain level 1/10.  NEUROLOGIC:  She has increased pain on hyperextension of her back, with intact deep tendon reflexes.  She has minimal dysesthesias over her SI joint regions.  IMPRESSION: 1. Lumbar spondylosis with facet joint syndrome, still improved from her    injections previously. 2. Tachycardia, which persists. 3. Mood swings secondary to Oxycontin, which I would not  recommend her going    back on. 4. Anxiety disorder per Dr. Jodi Marble.  DISPOSITION: 1. Continue with current medications. 2. The patient is encouraged to start exercises to recondition her back.  I    recommended Be[employer] R[employer] books on this. 3. Follow up with me in eight weeks. DD:  01/04/01 TD:  01/04/01 Job: [id number]

## 2011-05-02 NOTE — H&P (Signed)
[facility]  Patient:    Joanne Morrison, Joanne Morrison               MRN: [id number] Adm. Date:  [id number] Attending:  Lubertha South CC:         Titus Dubin. Alwyn Ren, M.D. [facility]   History and Physical  FOLLOWUP EVALUATION:  The patient comes in for followup evaluation.  She feels pretty good overall but not pain-free.  She continues to have localized pain to her right lumbosacral junction which is brought on by overexertion.  She has noted that at some point, she will get overexerted to the point that she has a band of pain that radiates into her groin and is so severe that she cannot stand.  It is at this point that she will typically take her OxyContin, which she has taken on a p.r.n. basis.  I advised her that my intent was not for her to take it on a p.r.n. basis but on a regular basis and recommended that we go on the tablets, one twice a day.  In addition, she has noted that she gets hyper at times.  She gets a tremor and she does feel her heart races on her.  She notes that her pain is made worse by standing or walking for long periods of time, especially over an hour, and improved by getting up off her feet.  EXAMINATION  VITAL SIGNS:  Blood pressure 146/98, heart rate is 120, respiratory rate is 24, O2 saturation is 97% and pain level is 5/10.  HEART:  We went ahead and got a strip on the patient and she does have some sinus arrhythmia and her heart rate will fluctuate from the 80s up to about 109.  Heart was otherwise unremarkable on exam.  NEUROLOGIC:  Her neuro exam shows symmetric deep tendon reflexes with negative straight leg raise signs.  She has mild increased discomfort on hyperextension to 20 degrees of her back and no pain on forward flexion.  IMPRESSION 1. Low back pain, which is improved on the basis of lumbar spondylosis. 2. Other medical problems per Dr. Titus Dubin. Hopper. 3. Tachycardia which may be related to her  amitriptyline.  DISPOSITION 1. Discontinue amitriptyline. 2. Desipramine 25 mg 1 p.o. q.d., #30 with 2 refills. 3. OxyContin 10 mg 1 p.o. b.i.d., #60 with no refill.  I advised her to take    the OxyContin on a regular basis. 4. Follow up with me in four weeks.  DD:  12/02/00 TD:  12/03/00 Job: [id number] NF/[id number]

## 2011-05-02 NOTE — Procedures (Signed)
[facility]  Patient:    Joanne Morrison, Joanne Morrison               MRN: [id number] Proc. Date: 08/20/00 Adm. Date:  [id number] Attending:  Thyra Breed CC:         Kerrin Champagne, M.D.  Titus Dubin. Alwyn Ren, M.D. [facility]   Procedure Report  PROCEDURE:  Facet joint nerve blocks at L5-S1, L4-L5 and L3-L4 on the right side.  DIAGNOSIS:  Lumbar spondylosis.  INTERVAL HISTORY:  The patient noted about three days worth of benefits from her last facet joint nerve blocks.  I advised her that this was a very positive response and that it shows that this was the source of her pain. Unfortunately, it did not last more than three days, and this tells [place] that the corticosteroids are not going to give her a lasting benefit.  She got at least eight weeks worth of benefit from the interarticular block, but she does not wish to go down that route again.  I advised her that we needed to reassess with diagnostic blocks again today to make sure she did not have a placebo effect and that this is the source of her discomfort.  She states she does get some left-sided discomfort.  She cannot sit for more than two hours without a great deal of discomfort.  She continues on the same medications as previously.  PHYSICAL EXAMINATION:  Blood pressure 157/97, heart rate 92, respiratory rates 14, O2 saturations 100%, pain level 5/10, temperature 97.3.  She demonstrates symmetric deep tendon reflexes.  Motor is 5/5.  Straight leg raise signs are negative.  She has tenderness over the right lower facet joints.  DESCRIPTION OF PROCEDURE:  After informed consent was obtained, the patient was taken to the fluoroscopy suite where she was placed in a prone position and monitored.  Lumbar vertebrae L1 through L5 were identified and marked over the spinous processes.  The junction of the superior articulating process with the transverse process was identified at L3-L4, L4-L5, and the sacral  cornua was identified at L5-S1.  The areas were marked.  The skin was prepped with Betadine x 3 and draped.  I anesthetized the skin using a 25 gauge needle using 1% lidocaine, 2 cc at each level.  A 25 gauge spinal needle was introduced down to the junction of the superior articulating process with the transverse process at L4-L5 and L3-L4, and at the sacral cornu at L5-S1. Oblique projections confirmed good placement.  Lateral projections confirmed that we were not in the neuroforamen.  Aspiration was negative.  Lidocaine 0.5 cc of 1% lidocaine was injected at each level, and there was no evidence of a spinal after 40 seconds.  Approximately 13 mg of Medrol with 1 cc of 1% lidocaine was injected at each level, and the needle was flushed with 1% lidocaine mixture.  The needles were removed intact.  Fifteen minutes after the procedure, the patient was allowed to get up and move about.  Her left-sided discomfort was resolved, and her pain level was down to a level of minimal if at all.  I advised her that based on these findings, we needed to bring her back in about four weeks to proceed with RF lesioning at L3-L4, L4-L5, and the sacral cornu for L5-S1 on the right side. She is in agreement.  I discussed with her the potential risks and benefits of this procedure.  ADDENDUM:  The patient revealed that she had had some  laboratory investigations done which included a rheumatoid panel which showed negative rheumatoid factor, normal uric acid level, and borderline positive ANA at 1:80 titer speckled.  I advised her that a speckled pattern requires repeating in about three months and has more significance than a borderline homogenous pattern.  I advised her that it was unlikely that these changes had any bearing on what was going on in her back. DD:  08/20/00 TD:  08/21/00 Job: [id number]

## 2011-05-02 NOTE — Consult Note (Signed)
[facility]  Patient:    Joanne Morrison, Joanne Morrison               MRN: [id number] Proc. Date: 10/06/00 Adm. Date:  [id number] Attending:  Lubertha South CC:         Titus Dubin. Alwyn Ren, M.D. [facility]  Kerrin Champagne, M.D.   Consultation Report  FOLLOWUP EVALUATION:  The patient notes that the RF treatment significantly reduced her lower back discomfort and she now has more of a parasacral discomfort, which she describes as a burning-type discomfort.  As she was tapered off the corticosteroids, she noted this recurred.  It has gotten progressively worse.  It affects her getting up or moving about.  She says it is a sharp, stabbing, sticking, burning-type discomfort.  It is localized to the right-to-mid sacral region.  She does note that the hydrocodone significantly reduces her discomfort.  She rates her pain at 5/10.  EXAMINATION  VITAL SIGNS:  Blood pressure 132/80, heart rate is 81, respiratory rate is 10, O2 saturation is 99%, pain level is 5/10 and temperature is 97.3.  NEUROLOGIC:  She describes symmetric deep tendon reflexes with no tenderness over the facet joints from previously, but tenderness in the parasacral and mid-sacral region to the right.  IMPRESSION 1. Lower back discomfort which I suspect may be reflective of a post    radiofrequency neuropathic-type sensation, which I suspect will improve. 2. Lumbar spondylosis, markedly improved after the facet joint blocks. 3. Other medical problems per Dr. Titus Dubin. Hopper.  DISPOSITION:  I offered the patient four options at this point:  We could go ahead and give her another taper of prednisone versus longer-acting opiates during the day versus longer-acting opiates plus amitriptyline alone in low dose and possibly a Lidoderm patch.  She has elected to go with the combination of OxyContin and Elavil initially.  I advised her that if she is not better in two weeks, that we will consider adding  a Lidoderm patch.  I am reluctant to keep adding in corticosteroids, as I am concerned that the side-effects will preclude the benefits.  I plan to see her back in followup in four weeks.  The patient advised me that she was upset over the delay in getting her phone calls through and I discussed this with her.  Apparently, we have had some newer people in the office that did not understand our protocol and I advised her to call me in the evenings if she had any questions.  I plan to see her back in followup in two weeks. DD:  10/06/00 TD:  10/06/00 Job: [id number]

## 2011-05-02 NOTE — H&P (Signed)
[facility]  Patient:    Joanne Morrison, Joanne Morrison               MRN: [id number] Adm. Date:  [id number] Attending:  Thyra Breed CC:         Kerrin Champagne, M.D.  Titus Dubin. Alwyn Ren, M.D. [facility]   History and Physical  FOLLOWUP EVALUATION  HISTORY OF PRESENT ILLNESS:  Joanne Morrison comes in for follow-up evaluation of her chronic low back pain on the basis of lumbar spondylosis predominantly characterized by facet joint arthritis.  She continues to do well after her radiofrequency lesioning and has started a new job.  She was also started on Depakote, which she states has markedly increased her overall sense of well-being.  She continues to require one to two tablets of hydrocodone per day.  She is doing exercises.  She notes that getting off her feet after standing for long periods of time will reduce her discomfort.  She noted when she takes a break at work if she sits and stretches her back she has much less discomfort.  CURRENT MEDICATIONS: 1. Hydrocodone 5/500 1 p.o. q.6h. p.r.n.; she takes about 1-2 per day. 2. Depakote 500 mg at night. 3. Valium p.r.n.  PHYSICAL EXAMINATION:  VITAL SIGNS:  Blood pressure 134/86, heart rate 84, respiratory rate 18, O2 saturation 98%.  Pain level 5/10.  NEUROLOGIC:  Straight leg raise signs are negative.  Deep tendon reflexes are symmetric.  She has some increased pain on the left lumbosacral region to hyperextension.  IMPRESSION: 1. Lumbar spondylosis with facet joint syndrome, stable. 2. Tachydysrhythmia which has resolved. 3. Anxiety disorder per Dr. Jodi Marble. 4. Perimenopausal per Dr. Jennette Kettle.  DISPOSITION: 1. Continue hydrocodone 5/500 1 p.o. q.6h. p.r.n., #100 with no refill. 2. Continue with [employer] exercises. 3. Followup with me in 12 weeks.  I discussed with her the fact that I do not    feel she needs a repeat RF as of yet since she is doing well and she    agrees. DD:  04/26/01 TD:  04/26/01 Job:  [id number]

## 2011-11-04 ENCOUNTER — Other Ambulatory Visit: Payer: Self-pay | Admitting: Gastroenterology

## 2011-11-04 ENCOUNTER — Other Ambulatory Visit (HOSPITAL_COMMUNITY): Payer: Self-pay | Admitting: Gastroenterology

## 2011-11-05 ENCOUNTER — Ambulatory Visit (HOSPITAL_COMMUNITY)
Admission: RE | Admit: 2011-11-05 | Discharge: 2011-11-05 | Disposition: A | Discharge status: Home / Self Care | Payer: 59 | Source: Ambulatory Visit | Attending: Gastroenterology | Admitting: Gastroenterology

## 2011-11-05 ENCOUNTER — Other Ambulatory Visit: Payer: 59

## 2011-11-05 DIAGNOSIS — R109 Unspecified abdominal pain: Secondary | ICD-10-CM | POA: Insufficient documentation

## 2011-11-05 DIAGNOSIS — K5730 Diverticulosis of large intestine without perforation or abscess without bleeding: Secondary | ICD-10-CM | POA: Insufficient documentation

## 2011-11-05 MED ORDER — IOHEXOL 300 MG/ML  SOLN
100.0000 mL | Freq: Once | INTRAMUSCULAR | Status: AC | PRN
  Administered 2011-11-05: 100 mL via INTRAVENOUS

## 2011-12-01 ENCOUNTER — Encounter: Payer: Self-pay | Admitting: Internal Medicine

## 2011-12-02 ENCOUNTER — Encounter: Payer: Self-pay | Admitting: Internal Medicine

## 2011-12-02 ENCOUNTER — Ambulatory Visit (INDEPENDENT_AMBULATORY_CARE_PROVIDER_SITE_OTHER): Payer: 59 | Admitting: Internal Medicine

## 2011-12-02 VITALS — BP 128/80 | HR 74 | Resp 12 | Ht 62.5 in | Wt 155.2 lb

## 2011-12-02 DIAGNOSIS — Z0000 Encounter for general adult medical examination without abnormal findings: Secondary | ICD-10-CM

## 2011-12-02 DIAGNOSIS — Z136 Encounter for screening for cardiovascular disorders: Secondary | ICD-10-CM

## 2011-12-02 DIAGNOSIS — E785 Hyperlipidemia, unspecified: Secondary | ICD-10-CM

## 2011-12-02 DIAGNOSIS — M899 Disorder of bone, unspecified: Secondary | ICD-10-CM

## 2011-12-02 DIAGNOSIS — Z23 Encounter for immunization: Secondary | ICD-10-CM

## 2011-12-02 DIAGNOSIS — M949 Disorder of cartilage, unspecified: Secondary | ICD-10-CM

## 2011-12-02 DIAGNOSIS — R439 Unspecified disturbances of smell and taste: Secondary | ICD-10-CM

## 2011-12-02 LAB — CBC WITH DIFFERENTIAL/PLATELET
Basophils Absolute: 0 10*3/uL (ref 0.0–0.1)
Lymphs Abs: 1.8 10*3/uL (ref 0.7–4.0)
MCV: 82.8 fl (ref 78.0–100.0)
Monocytes Absolute: 0.3 10*3/uL (ref 0.1–1.0)
Monocytes Relative: 4.6 % (ref 3.0–12.0)
Neutro Abs: 4.6 10*3/uL (ref 1.4–7.7)
RBC: 4.9 Mil/uL (ref 3.87–5.11)
RDW: 14.6 % (ref 11.5–14.6)
WBC: 6.9 10*3/uL (ref 4.5–10.5)

## 2011-12-02 LAB — HEPATIC FUNCTION PANEL
Alkaline Phosphatase: 58 U/L (ref 39–117)
Bilirubin, Direct: 0.1 mg/dL (ref 0.0–0.3)

## 2011-12-02 LAB — LIPID PANEL
Cholesterol: 231 mg/dL — ABNORMAL HIGH (ref 0–200)
Total CHOL/HDL Ratio: 3
Triglycerides: 197 mg/dL — ABNORMAL HIGH (ref 0.0–149.0)
VLDL: 39.4 mg/dL (ref 0.0–40.0)

## 2011-12-02 LAB — TSH: TSH: 1.8 u[IU]/mL (ref 0.35–5.50)

## 2011-12-02 LAB — BASIC METABOLIC PANEL
Calcium: 9 mg/dL (ref 8.4–10.5)
Chloride: 105 mEq/L (ref 96–112)
Creatinine, Ser: 1 mg/dL (ref 0.4–1.2)
GFR: 63.97 mL/min (ref >60.00)
Potassium: 3.4 mEq/L — ABNORMAL LOW (ref 3.5–5.1)

## 2011-12-02 NOTE — Progress Notes (Signed)
Subjective:    Patient ID: Joanne Morrison, female    DOB: 1951/10/09, 60 y.o.   MRN: [id number]  HPI  Joanne Morrison is here for a physical;acute issues include emotional reaction to her mother's death  Dec 14, 2011. This has disturbed her sleep pattern despite med changes by Dr Raquel James   Review of Systems Patient reports no vision/ hearing  changes, adenopathy,fever, weight change,  persistant / recurrent hoarseness , swallowing issues, chest pain,palpitations,edema,persistant /recurrent cough, hemoptysis, dyspnea( rest/ exertional/paroxysmal nocturnal), gastrointestinal bleeding(melena, rectal bleeding),  significant heartburn,  bowel changes,GU symptoms(dysuria, hematuria,pyuria, incontinence), Gyn symptoms(abnormal  bleeding , pain),  syncope, focal weakness, memory loss, skin/hair /nail changes, or abnormal bruising or bleeding .  After mother said she was having significant abdominal pain. She saw Dr. Dulce Sellar did an extensive evaluation including MRI. This was negative.  She has intermittent numbness and tingling of left hand which she believes is probable carpal tunnel syndrome. She's had surgery on the right.  She does have occasional palpitations. (See EKG: single PAC was present)     Objective:   Physical Exam Gen.: Healthy and well-nourished in appearance. Alert, appropriate and cooperative throughout exam. Head: Normocephalic without obvious abnormalities Eyes: No corneal or conjunctival inflammation noted. Pupils equal round reactive to light and accommodation. Fundal exam is benign without hemorrhages, exudate, papilledema. Extraocular motion intact. Vision grossly normal. Ears: External  ear exam reveals no significant lesions or deformities. Canals clear .TMs normal. Hearing is grossly normal bilaterally. Nose: External nasal exam reveals no deformity or inflammation. Nasal mucosa are pink and moist. No lesions or exudates noted.   Mouth: Oral mucosa and oropharynx reveal no lesions or  exudates. Teeth in good repair. Neck: No deformities, masses, or tenderness noted. Range of motion &. Thyroid normal. Lungs: Normal respiratory effort; chest expands symmetrically. Lungs are clear to auscultation without rales, wheezes, or increased work of breathing. Heart: Normal rate and rhythm. Normal S1 and S2. No gallop, click, or rub. No  murmur. Abdomen: Bowel sounds normal; abdomen soft and nontender. No masses, organomegaly or hernias noted. Genitalia: Dr Jennette Kettle   .                                                                                   Musculoskeletal/extremities: No deformity or scoliosis noted of  the thoracic or lumbar spine. No clubbing, cyanosis, edema, or deformity noted. Range of motion  normal .Tone & strength  normal.Joints normal. Nail health  good. Vascular: Carotid, radial artery, dorsalis pedis and  posterior tibial pulses are full and equal. No bruits present. Neurologic: Alert and oriented x3. Deep tendon reflexes symmetrical and normal.          Skin: Intact without suspicious lesions or rashes. Lymph: No cervical, axillary lymphadenopathy present. Psych: Mood and affect are normal. Normally interactive  Assessment & Plan:  #1 comprehensive physical exam; no acute findings #2 see Problem List with Assessments & Recommendations #3 grief reaction. Hospice counselling recommended Plan: see Orders

## 2011-12-02 NOTE — Patient Instructions (Signed)
Preventive Health Care: Exercise  30-45  minutes a day, 3-4 days a week. Walking is especially valuable in preventing Osteoporosis. Eat a low-fat diet with lots of fruits and vegetables, up to 7-9 servings per day. Consume less than 30 grams of sugar per day from foods & drinks with High Fructose Corn Syrup as # 1,2,3 or #4 on label. To prevent palpitations or premature beats, avoid stimulants such as decongestants, diet pills, nicotine, or caffeine (coffee, tea, cola, or chocolate) to excess.

## 2011-12-02 NOTE — Assessment & Plan Note (Signed)
EKG is normal except for a single PAC. There are no ischemic changes.

## 2011-12-03 LAB — VITAMIN D 25 HYDROXY (VIT D DEFICIENCY, FRACTURES): Vit D, 25-Hydroxy: 44 ng/mL (ref 30–89)

## 2011-12-16 LAB — HM MAMMOGRAPHY

## 2011-12-16 LAB — HM PAP SMEAR

## 2012-01-09 ENCOUNTER — Ambulatory Visit (INDEPENDENT_AMBULATORY_CARE_PROVIDER_SITE_OTHER): Payer: BC Managed Care – PPO

## 2012-01-09 DIAGNOSIS — R300 Dysuria: Secondary | ICD-10-CM

## 2012-01-09 DIAGNOSIS — R350 Frequency of micturition: Secondary | ICD-10-CM

## 2012-02-02 ENCOUNTER — Other Ambulatory Visit: Payer: Self-pay | Admitting: Internal Medicine

## 2012-02-02 DIAGNOSIS — E876 Hypokalemia: Secondary | ICD-10-CM

## 2012-02-04 ENCOUNTER — Other Ambulatory Visit: Payer: 59

## 2012-02-10 ENCOUNTER — Other Ambulatory Visit: Payer: 59

## 2012-02-12 ENCOUNTER — Other Ambulatory Visit: Payer: 59

## 2012-03-11 ENCOUNTER — Ambulatory Visit (INDEPENDENT_AMBULATORY_CARE_PROVIDER_SITE_OTHER): Payer: BC Managed Care – PPO | Admitting: Family Medicine

## 2012-03-11 ENCOUNTER — Encounter: Payer: Self-pay | Admitting: Family Medicine

## 2012-03-11 VITALS — BP 116/74 | HR 80 | Temp 98.2°F | Wt 154.0 lb

## 2012-03-11 DIAGNOSIS — R059 Cough, unspecified: Secondary | ICD-10-CM

## 2012-03-11 DIAGNOSIS — J329 Chronic sinusitis, unspecified: Secondary | ICD-10-CM

## 2012-03-11 DIAGNOSIS — R05 Cough: Secondary | ICD-10-CM

## 2012-03-11 MED ORDER — GUAIFENESIN-CODEINE 100-10 MG/5ML PO SYRP: ORAL_SOLUTION | ORAL | Status: DC

## 2012-03-11 MED ORDER — AMOXICILLIN-POT CLAVULANATE 875-125 MG PO TABS: 1.0000 | ORAL_TABLET | Freq: Two times a day (BID) | ORAL | Status: AC

## 2012-03-11 MED ORDER — AMOXICILLIN-POT CLAVULANATE 875-125 MG PO TABS: 1.0000 | ORAL_TABLET | Freq: Two times a day (BID) | ORAL | Status: DC

## 2012-03-11 NOTE — Progress Notes (Signed)
  Subjective:     Joanne Morrison is a 61 y.o. female who presents for evaluation of sinus pain. Symptoms include: congestion, facial pain, headaches, nasal congestion and sinus pressure. Onset of symptoms was 3 days ago. Symptoms have been gradually worsening since that time. Past history is significant for no history of pneumonia or bronchitis. Patient is a non-smoker. Used Nyquil with no relief.    The following portions of the patient's history were reviewed and updated as appropriate: allergies, current medications, past family history, past medical history, past social history, past surgical history and problem list.  Review of Systems Pertinent items are noted in HPI.   Objective:    BP 116/74  Pulse 80  Temp(Src) 98.2 F (36.8 C) (Oral)  Wt 154 lb (69.854 kg)  SpO2 95% General appearance: alert, cooperative, appears stated age and no distress Ears: normal TM's and external ear canals both ears Nose: green discharge, mild congestion, turbinates red, swollen, sinus tenderness bilateral Throat: lips, mucosa, and tongue normal; teeth and gums normal Neck: mild anterior cervical adenopathy, supple, symmetrical, trachea midline and thyroid not enlarged, symmetric, no tenderness/mass/nodules Lungs: clear to auscultation bilaterally Heart: S1, S2 normal Lymph nodes: Cervical adenopathy: b/l    Assessment:    Acute bacterial sinusitis.    Plan:    Nasal steroids per medication orders. Augmentin per medication orders. f /u prn

## 2012-03-11 NOTE — Patient Instructions (Signed)

## 2012-03-11 NOTE — Progress Notes (Signed)
Addended by: Lelon Perla on: 03/11/2012 03:29 PM   Modules accepted: Orders

## 2012-04-18 ENCOUNTER — Encounter (HOSPITAL_COMMUNITY): Payer: Self-pay | Admitting: *Deleted

## 2012-04-18 ENCOUNTER — Inpatient Hospital Stay (HOSPITAL_COMMUNITY)
Admission: AD | Admit: 2012-04-18 | Discharge: 2012-04-18 | Disposition: A | Discharge status: Home / Self Care | Payer: BC Managed Care – PPO | Source: Ambulatory Visit | Attending: Obstetrics and Gynecology | Admitting: Obstetrics and Gynecology

## 2012-04-18 DIAGNOSIS — N949 Unspecified condition associated with female genital organs and menstrual cycle: Secondary | ICD-10-CM | POA: Insufficient documentation

## 2012-04-18 DIAGNOSIS — R55 Syncope and collapse: Secondary | ICD-10-CM | POA: Insufficient documentation

## 2012-04-18 DIAGNOSIS — N938 Other specified abnormal uterine and vaginal bleeding: Secondary | ICD-10-CM | POA: Insufficient documentation

## 2012-04-18 LAB — CBC
HCT: 40.3 % (ref 36.0–46.0)
Hemoglobin: 12.6 g/dL (ref 12.0–15.0)
MCH: 26.5 pg (ref 26.0–34.0)
MCHC: 31.3 g/dL (ref 30.0–36.0)

## 2012-04-18 MED ORDER — LACTATED RINGERS IV SOLN
INTRAVENOUS | Status: DC
  Administered 2012-04-18: 22:00:00 via INTRAVENOUS

## 2012-04-18 NOTE — MAU Provider Note (Signed)
History     CSN: [id number]  Arrival date & time 04/18/12  1823   None     Chief Complaint  Patient presents with  . Vaginal Bleeding  . Near Syncope    HPI Joanne Morrison is a 61 y.o. female who presents to MAU for vaginal bleeding and dizziness. Complains of feeling really dizzy today. Larey Seat out of a chair from being dizzy, felt disoriented.  Had one episode of feeling like heart palpations. No appetite. Ate part of a sandwich today before coming in to MAU but that was all since last night. Has been on Prometrium for the bleeding. Scheduled for hysterectomy in 2 days. The history was provided by the patient.   Past Medical History  Diagnosis Date  . Migraine   . Anosmia   . Osteopenia     Dr Jennette Kettle, [facility]  . Depression   . MVP (mitral valve prolapse)   . Hyperlipidemia     Past Surgical History  Procedure Date  . Tubal ligation   . Colonoscopy w/ polypectomy 2010    Dr Marina Goodell, due 2015  . Bladder surgery     for cystitis & urinary retention  . Carpal tunnel release     RUE  . Nerve blocks     [facility]  . Dilation and curettage of uterus     Family History  Problem Relation Age of Onset  . Hypertension Mother   . Dementia Mother   . Cancer Mother     LUNG  . Osteoporosis Mother   . Heart disease Father 40    MI  . Hypertension Brother   . Diabetes Maternal Aunt   . Cancer Maternal Uncle     RENAL  . Heart disease Maternal Grandmother 66    MI  . Cancer Maternal Grandfather     LUNG  . Anesthesia problems Neg Hx     History  Substance Use Topics  . Smoking status: Never Smoker   . Smokeless tobacco: Not on file  . Alcohol Use: No    OB History    Grav Para Term Preterm Abortions TAB SAB Ect Mult Living   1 1 1       1       Review of Systems  Constitutional: Positive for fatigue. Negative for fever, chills and diaphoresis.  HENT: Negative for ear pain, congestion, sore throat, facial swelling, neck pain, neck stiffness, dental  problem and sinus pressure.   Eyes: Positive for visual disturbance. Negative for photophobia, pain and discharge.  Respiratory: Negative for cough, chest tightness and wheezing.   Cardiovascular: Positive for palpitations. Negative for chest pain.  Gastrointestinal: Positive for abdominal pain. Negative for nausea, vomiting, diarrhea, constipation and abdominal distention.  Genitourinary: Positive for vaginal bleeding. Negative for dysuria, frequency, flank pain and difficulty urinating.  Musculoskeletal: Negative for myalgias, back pain and gait problem.  Skin: Negative for color change and rash.  Neurological: Positive for dizziness and light-headedness. Negative for speech difficulty, weakness, numbness and headaches.  Psychiatric/Behavioral: Negative for confusion and agitation. The patient is not nervous/anxious.     Allergies  Sumatriptan  Home Medications  No current outpatient prescriptions on file.  BP 132/88  Pulse 80  Temp(Src) 97 F (36.1 C) (Oral)  Resp 18  Ht 5\' 2"  (1.575 m)  Wt 152 lb (68.947 kg)  BMI 27.80 kg/m2  Physical Exam  Nursing note and vitals reviewed. Constitutional: She is oriented to person, place, and time. She appears well-developed  and well-nourished. No distress.  HENT:  Head: Normocephalic.  Eyes: EOM are normal.  Neck: Neck supple. No JVD present. No tracheal deviation present.  Cardiovascular: Normal rate, regular rhythm and normal heart sounds.   Pulmonary/Chest: Effort normal and breath sounds normal.  Abdominal: Soft. Bowel sounds are normal. There is tenderness in the left lower quadrant. There is no rebound and no guarding.  Genitourinary:       External genitalia without lesions. Scant blood vaginal vault. No CMT, mild left adnexal tenderness.   Musculoskeletal: Normal range of motion.  Neurological: She is alert and oriented to person, place, and time. No cranial nerve deficit.  Skin: Skin is warm and dry.  Psychiatric: She has a  normal mood and affect. Her behavior is normal. Judgment and thought content normal.   Results for orders placed during the hospital encounter of 04/18/12 (from the past 24 hour(s))  CBC     Status: Normal   Collection Time   04/18/12  6:45 PM      Component Value Range   WBC 6.0  4.0 - 10.5 (K/uL)   RBC 4.75  3.87 - 5.11 (MIL/uL)   Hemoglobin 12.6  12.0 - 15.0 (g/dL)   HCT 45.4  09.8 - 11.9 (%)   MCV 84.8  78.0 - 100.0 (fL)   MCH 26.5  26.0 - 34.0 (pg)   MCHC 31.3  30.0 - 36.0 (g/dL)   RDW 14.7  82.9 - 56.2 (%)   Platelets 247  150 - 400 (K/uL)  SAMPLE TO BLOOD BANK     Status: Normal   Collection Time   04/18/12  6:45 PM      Component Value Range   Blood Bank Specimen SAMPLE AVAILABLE FOR TESTING     Sample Expiration 04/21/2012     EKG Normal sinus rhythm. Call to Cardiologist reading EKG's and EKG is within norm.  ED Course: Discussed clinical and lab findings with Dr. Arelia Sneddon. Will start IV of LR and give 1000 ccs and then recheck vital signs and discharge home to follow up with Dr. Jennette Kettle in the morning.  Procedures   Assessment: Near syncopal episode.   Abnormal vaginal bleeding  Plan:  IV hydration   D/c home to follow up in the office in am.   Care turned over to Wynelle Bourgeois, CNM @ 21:00 pm     MDM   IV infusion completed.   WIll d/c home.

## 2012-04-18 NOTE — MAU Note (Signed)
Pt reports she is scheduled for hysterectomy on Tues with Dr. Jennette Kettle. Has had vaginal bleeding on and off for over a month has been taking Prometrium on and off as well for the bleeding. Over the past few days bleeding has been heavier and pt stated she has felt weaker. Pt got up from sitting  And fell to the floor at home.

## 2012-04-18 NOTE — MAU Note (Signed)
Pt reports bleeding for last 6 weeks. The last couple of days the pt has noticed and increase in bleeding. Pt feels dizzy, and has no appetite, and feels very tired. Pt reports cramping pain in lower abdomen which comes and goes. Pain is mostly on L side.

## 2012-04-19 ENCOUNTER — Other Ambulatory Visit: Payer: Self-pay | Admitting: Internal Medicine

## 2012-04-19 DIAGNOSIS — R51 Headache: Secondary | ICD-10-CM

## 2012-04-20 ENCOUNTER — Other Ambulatory Visit: Payer: Self-pay | Admitting: Obstetrics & Gynecology

## 2012-04-20 HISTORY — PX: LAPAROSCOPIC VAGINAL HYSTERECTOMY WITH SALPINGO OOPHORECTOMY: SHX6681

## 2012-07-08 ENCOUNTER — Encounter: Payer: Self-pay | Admitting: Internal Medicine

## 2012-07-08 ENCOUNTER — Other Ambulatory Visit: Payer: Self-pay | Admitting: Internal Medicine

## 2012-07-08 ENCOUNTER — Ambulatory Visit (INDEPENDENT_AMBULATORY_CARE_PROVIDER_SITE_OTHER): Payer: BC Managed Care – PPO | Admitting: Internal Medicine

## 2012-07-08 VITALS — BP 114/70 | HR 78 | Temp 97.7°F | Resp 12 | Wt 154.2 lb

## 2012-07-08 DIAGNOSIS — N390 Urinary tract infection, site not specified: Secondary | ICD-10-CM

## 2012-07-08 DIAGNOSIS — R319 Hematuria, unspecified: Secondary | ICD-10-CM

## 2012-07-08 DIAGNOSIS — J029 Acute pharyngitis, unspecified: Secondary | ICD-10-CM

## 2012-07-08 LAB — POCT URINALYSIS DIPSTICK
Bilirubin, UA: NEGATIVE
Glucose, UA: NEGATIVE
Spec Grav, UA: <=1.005

## 2012-07-08 LAB — POCT RAPID STREP A (OFFICE): Rapid Strep A Screen: NEGATIVE

## 2012-07-08 MED ORDER — SULFAMETHOXAZOLE-TRIMETHOPRIM 800-160 MG PO TABS: 1.0000 | ORAL_TABLET | Freq: Two times a day (BID) | ORAL | Status: AC

## 2012-07-08 NOTE — Progress Notes (Signed)
Subjective:     Patient ID: Joanne Morrison, female   DOB: December 02, 1951, 61 y.o.   MRN: [id number]  HPI She has experienced dysuria for the last 3-4 days. On 07/07/12 she noted some itching and questioned having a fungal vaginitis. Diflucan was called in by her gynecologist  Yesterday she also noted a severe sore throat associated with increased mucus in her throat. She's had some discomfort in the ethmoid areas but has not had significant frontal sinus or maxillary sinus pain. She is not having nasal purulence   Review of Systems There's been no associated fever, chills, or sweats. Also she is not had cough, sputum production, shortness of breath, wheezing.  She is not had pyuria or hematuria     Objective:   Physical Exam General appearance:good health ;well nourished; no acute distress or increased work of breathing is present.  No  lymphadenopathy about the head, neck, or axilla noted.   Eyes: No conjunctival inflammation or lid edema is present.   Ears:  External ear exam shows no significant lesions or deformities.  Otoscopic examination reveals clear canals, tympanic membranes are intact bilaterally without bulging, retraction, inflammation or discharge.  Nose:  External nasal examination shows no deformity or inflammation. Nasal mucosa are pink and moist without lesions or exudates. No septal dislocation or deviation.No obstruction to airflow.   Oral exam: Dental hygiene is good; lips and gums are healthy appearing.There is minimal  oropharyngeal erythema ; no exudate noted. Slightly hoarse   Heart:  Normal rate and regular rhythm. S1 and S2 normal without gallop, murmur, click, rub or other extra sounds.   Lungs:Chest clear to auscultation; no wheezes, rhonchi,rales ,or rubs present.No increased work of breathing.    GLO:VFIEP sounds are normal. Abdomen is soft and nontender with no organomegaly, hernias  or masses. No flank tenderness  Extremities:  No cyanosis, edema, or  clubbing  noted    Skin: Warm & dry w/o jaundice or tenting.      Assessment:     #1 pharyngitis; rapid beta strep  #2 dysuria; small amount of microscopic blood on urinalysis. Culture pending  #3 possible fungal vaginitis;: Diflucan  Plan: See orders and recommendations     Plan:

## 2012-07-08 NOTE — Telephone Encounter (Signed)
I clarified with Dr.Hopper and called the pharmacy, dispense number is #[id number]

## 2012-07-08 NOTE — Patient Instructions (Addendum)
Plain Mucinex for thick secretions ;force NON dairy fluids . Use a Neti pot daily as needed for sinus congestion; going from open side to congested side . Nasal cleansing in the shower as discussed. Make sure that all residual soap is removed to prevent irritation. Steroid nasal medication 1 spray in each nostril twice a day as needed. Use the "crossover" technique as discussed. Plain Allegra 160 daily as needed for itchy eyes & sneezing.   Zicam Melts or Zinc lozenges ; vitamin C 2000 mg daily; & Echinacea for 4-7 days. Report fever, exudate("pus") or progressive pain.

## 2012-07-08 NOTE — Telephone Encounter (Signed)
Pharm called and they need to know the dose of the medication that was just sent off for pt, Joanne Morrison

## 2012-07-10 LAB — URINE CULTURE: Organism ID, Bacteria: NO GROWTH

## 2012-07-14 ENCOUNTER — Telehealth: Payer: Self-pay | Admitting: Internal Medicine

## 2012-07-14 MED ORDER — BENZONATATE 200 MG PO CAPS: 200.0000 mg | ORAL_CAPSULE | Freq: Four times a day (QID) | ORAL | Status: AC | PRN

## 2012-07-14 NOTE — Telephone Encounter (Signed)
Generic Tessalon Perles 200 mg one every 6 hours as needed ;dispense 15

## 2012-07-14 NOTE — Telephone Encounter (Signed)
Caller: Tiney/Patient; PCP: Marga Melnick; CB#: [phone]; ; ; Call regarding Cough/Congestion;  Had bad sore throat, developed phlegm in back of throat.  Saw Dr Alwyn Ren about 13 days ago, started Rx/Sulfamethoxazole-TMP for UTI and Sx have improved a lot - has one pill left.  Told to try Mucinex for URI Sx. Has tried Mucinex, nettie pot but started cough last night 7/30.  Has coughing spells at random times, can't get anything out.  Feels loose mucus in throat, pressure in back of sinuses, congestion of nose.  Sore throat improved.  Afebrile.  Says Dr Alwyn Ren told her to call if not improving and would call in something. [facility]ses [facility] [address]  [phone]. Can reach patient at  [phone].

## 2012-07-14 NOTE — Telephone Encounter (Signed)
Discuss with patient, Rx sent. 

## 2012-08-17 ENCOUNTER — Telehealth: Payer: Self-pay | Admitting: Internal Medicine

## 2012-08-17 NOTE — Telephone Encounter (Signed)
Pt called insurance they will pay for if given here for both her and her spouse Made appt for 9.1013 @ 2 & 215 for both of these patient  Pt stated neither she nor her husband has had this vaccination.  Just an FYI  Spouse dob 8.8.1962

## 2012-08-17 NOTE — Telephone Encounter (Signed)
Noted, I reviewed paper chart to make sure Vaccine not received prior to EMR (centricity)

## 2012-08-19 ENCOUNTER — Ambulatory Visit (INDEPENDENT_AMBULATORY_CARE_PROVIDER_SITE_OTHER): Payer: BC Managed Care – PPO | Admitting: Sports Medicine

## 2012-08-19 VITALS — BP 142/80 | Ht 62.0 in | Wt 149.0 lb

## 2012-08-19 DIAGNOSIS — M545 Low back pain, unspecified: Secondary | ICD-10-CM | POA: Insufficient documentation

## 2012-08-19 DIAGNOSIS — G8929 Other chronic pain: Secondary | ICD-10-CM

## 2012-08-19 MED ORDER — AMITRIPTYLINE HCL 25 MG PO TABS: 25.0000 mg | ORAL_TABLET | Freq: Every day | ORAL | Status: DC

## 2012-08-19 NOTE — Progress Notes (Signed)
Joanne Morrison is a 61 y.o. female who presents to [facility] today for low back pain.  Patient is seeking a second opinion.  She has low back pain for the last 20 years worsening about one year ago. Over the past at least 10 years she has been seeing Dr. Vear Clock for management of her low back pain symptoms. She's been diagnosed with grade 1 spondylolisthesis.  Her current treatment protocol is intermittent fluoroscopic guided epidural injections and oxycodone and Valium.  She's had a evaluation by Dr. Yevette Edwards who recommended against surgery at this time.  She denies any significant radicular pain weakness or numbness.  Additionally she denies any difficulty walking bowel or bladder dysfunction.   She performs home exercises including hip extension strengthening and spine flexion stretching.     PMH reviewed. Status post hysterectomy. Depression History  Substance Use Topics  . Smoking status: Never Smoker   . Smokeless tobacco: Not on file  . Alcohol Use: No   ROS as above otherwise neg   Exam:  BP 142/80  Ht 5\' 2"  (1.575 m)  Wt 149 lb (67.586 kg)  BMI 27.25 kg/m2 Gen: Well NAD MSK: Back: Nontender over spinal midline. No step-offs palpated.  Range of motion: Normal back flexion normal extension normal lateral flexion and rotation. Neuro: Alert and oriented. Reflexes 1+ and equal bilaterally at knees and ankles. Strength: Hip flexion strength 5/5 bilaterally. Hip abduction and adduction 5/5 bilaterally. Knee flexion and extension 5/5 bilaterally. Ankle dorsiflexion and plantarflexion 5/5 bilaterally. Great toe dorsiflexion and plantarflexion 5/5 bilateral. Patient can stand on her toes and heels and get on and off exam table.   Gait analysis:  Mild pronation and mild foot external rotation. No Trendelenburg gait.   Leg length: No leg length discrepancy  Foot: Bilateral Cavus foot at rest with some longitudinal collapse upon standing. Normal heel valgus on toe  standing.  X-ray: Reviewed abdominal/pelvic CT scan from 2012 which showed grade 1 nearing grade 2 spondylolisthesis at L5/S1 level.

## 2012-08-19 NOTE — Patient Instructions (Addendum)
Thank you for coming in today. For back pain. Try amitriptyline at night to help with some pain and to help you sleep.  It may cause some dry mouth. If it is bothersome try 1/2 pill.  Avoid any activity with a lot of leaning back or back extensions.  Also avoid the kicking back exercises.  Anything that worsens back extension will cause pain.    Exercises with breathing.  Goals:  Strength abdominal and back muscles without hurting yourself.   Warm up: 1) Forward flexion, to before the point of pain.  2) Leaning side to side with slight forward bend. 3) Back rotation.   Stretches:  4) Deep knee to chest stretches.  5) Deep cross knee to chest stretches.  6) Both knees to chest at the same time with some rotation.  7) Child's pose (on knees with forward stretch).   Strength:  8) Abdominal crunches 9) Cross over abdominal crunches.  10) Pelvic tilt. Try to get your belly button to spine.  11) Abdominal isometrics.  Tighten belly.  12) Straight leg raises on either side.  13) Back 1/2 bent to straight lifts with light dumbbells in both hands. Do not "lock out" your back.  Come back in 4-6 weeks or so unless much better.  Bring walking or running shoes and some shorts.

## 2012-08-19 NOTE — Assessment & Plan Note (Addendum)
Likely secondary to spondylolisthesis. Currently treated with chronic opiates. Plan:  Low dose amitriptyline at night for pain and sleep control .  Emphasize abdominal strengthening   And back flexion stretching .  Please see discharge instructions for full details .   Additionally we'll followup in 4-6 weeks. At that time will bring shoes and shorts may consider temporary insoles for mild foot pronation.   Goal to avoid surgery.   We would like to work with her to see if non narcotic meds are an option along with HEP No EXTENSION exercise as this may be making sxs worse

## 2012-08-24 ENCOUNTER — Ambulatory Visit: Payer: BC Managed Care – PPO

## 2012-09-01 ENCOUNTER — Ambulatory Visit: Payer: BC Managed Care – PPO

## 2012-09-09 ENCOUNTER — Ambulatory Visit (INDEPENDENT_AMBULATORY_CARE_PROVIDER_SITE_OTHER): Payer: BC Managed Care – PPO

## 2012-09-09 DIAGNOSIS — Z23 Encounter for immunization: Secondary | ICD-10-CM

## 2012-09-30 ENCOUNTER — Ambulatory Visit: Payer: BC Managed Care – PPO | Admitting: Sports Medicine

## 2012-12-03 ENCOUNTER — Encounter: Payer: Self-pay | Admitting: Internal Medicine

## 2012-12-03 ENCOUNTER — Ambulatory Visit (INDEPENDENT_AMBULATORY_CARE_PROVIDER_SITE_OTHER): Payer: BC Managed Care – PPO | Admitting: Internal Medicine

## 2012-12-03 VITALS — BP 124/80 | HR 66 | Temp 97.5°F | Resp 12 | Ht 62.5 in | Wt 159.4 lb

## 2012-12-03 DIAGNOSIS — Z0000 Encounter for general adult medical examination without abnormal findings: Secondary | ICD-10-CM

## 2012-12-03 DIAGNOSIS — R002 Palpitations: Secondary | ICD-10-CM

## 2012-12-03 LAB — LIPID PANEL
LDL Cholesterol: 110 mg/dL — ABNORMAL HIGH (ref 0–99)
Total CHOL/HDL Ratio: 3
Triglycerides: 119 mg/dL (ref 0.0–149.0)

## 2012-12-03 LAB — BASIC METABOLIC PANEL
Chloride: 102 mEq/L (ref 96–112)
Potassium: 3.7 mEq/L (ref 3.5–5.1)
Sodium: 138 mEq/L (ref 135–145)

## 2012-12-03 LAB — HEPATIC FUNCTION PANEL
ALT: 14 U/L (ref 0–35)
AST: 18 U/L (ref 0–37)
Bilirubin, Direct: 0 mg/dL (ref 0.0–0.3)
Total Bilirubin: 0.5 mg/dL (ref 0.3–1.2)

## 2012-12-03 LAB — TSH: TSH: 1.88 u[IU]/mL (ref 0.35–5.50)

## 2012-12-03 LAB — MAGNESIUM: Magnesium: 2.2 mg/dL (ref 1.5–2.5)

## 2012-12-03 NOTE — Patient Instructions (Addendum)
Preventive Health Care: Exercise  30-45  minutes a day, 3-4 days a week as water aerobics. The best exercises for the low back include freestyle swimming, stretch aerobics, and yoga.  Eat a low-fat diet with lots of fruits and vegetables, up to 7-9 servings per day.  Consume less than 30 grams of sugar per day from foods & drinks with High Fructose Corn Syrup as #1,2,3 or #4 on label. To prevent palpitations or premature beats, avoid stimulants such as decongestants, diet pills, nicotine, or caffeine (coffee, tea, cola, or chocolate) to excess.

## 2012-12-03 NOTE — Progress Notes (Signed)
Subjective:    Patient ID: Joanne Morrison, female    DOB: [birth year], 61 y.o.   MRN: [id number]  HPI  Joanne Morrison is here for a physical;acute issues include some nocturnal palpitaions      Review of Systems For approximately hour after going to bed she does note some fluttering 3-4 X/ weeks. She has no symptoms with her water aerobics. EKG done preoperatively in may of this year was normal. She does have a history of mitral valve prolapse. Her K+ was 3.4 on 12/02/11. She takes magnesium to prevent constipation from pain meds  She states her mind "races" as she tries to sleep. Dr. Kizzie Bane has prescribed Ambien  She denies reflux symptoms and has no history of vital hernia. She denies intake of stimulants particularly after the evening meal     Objective:   Physical Exam Gen.:  well-nourished in appearance. Alert, appropriate and cooperative throughout exam. Head: Normocephalic without obvious abnormalities Eyes: No corneal or conjunctival inflammation noted. Pupils equal round reactive to light and accommodation. Fundal exam is benign without hemorrhages, exudate, papilledema. Extraocular motion intact. Vision grossly normal. Ears: External  ear exam reveals no significant lesions or deformities. Canals clear .TMs normal. Hearing is grossly normal bilaterally. Nose: External nasal exam reveals no deformity or inflammation. Nasal mucosa are pink and moist. No lesions or exudates noted.  Mouth: Oral mucosa and oropharynx reveal no lesions or exudates. Teeth in good repair. Neck: No deformities, masses, or tenderness noted. Range of motion & Thyroid normal. Lungs: Normal respiratory effort; chest expands symmetrically. Lungs are clear to auscultation without rales, wheezes, or increased work of breathing. Heart: Normal rate and rhythm. Normal S1 and S2. No gallop, click, or rub. S4 w/o  murmur. Abdomen: Bowel sounds normal; abdomen soft and nontender. No masses, organomegaly or hernias  noted. Genitalia: Dr Jennette Kettle, Gyn                                                                                  Musculoskeletal/extremities: No deformity or scoliosis noted of  the thoracic or lumbar spine. No clubbing, cyanosis, edema, or deformity noted. Range of motion  normal .Tone & strength  normal.Joints normal. Nail health  good. Vascular: Carotid, radial artery, dorsalis pedis and  posterior tibial pulses are full and equal. No bruits present. Neurologic: Alert and oriented x3. Deep tendon reflexes symmetrical and normal.          Skin: Intact without suspicious lesions or rashes. Lymph: No cervical, axillary lymphadenopathy present. Psych: Mood and affect are normal. Normally interactive                                                                                       Assessment & Plan:  #1 comprehensive physical exam; no acute findings #2 palpitations in context of anxiety  Plan: see Orders

## 2013-10-20 ENCOUNTER — Other Ambulatory Visit: Payer: Self-pay

## 2013-12-02 ENCOUNTER — Telehealth: Payer: Self-pay

## 2013-12-02 NOTE — Telephone Encounter (Signed)
error 

## 2013-12-02 NOTE — Telephone Encounter (Signed)
Medication and allergies:  Reviewed and updated  90 day supply/mail order: na [facility] pharmacy: [facility] [employer]   Immunizations due:  UTD  A/P:   No changes to FH or personal hx Carpal tunnel surgery 08/2013 on left (date updated) Pap--12/2011--scheduled to see Dr Jennette Kettle 12/2013 MMG--12/2011 Bone Density--12/2011 CCS--05/25/2009--Dr Perry--next due 2020 Flu vaccine--09/2013 Shingles--11/2012  To Discuss with Provider: Maxalt's tier has changed and is now costing more $ Can we change to less expensive?

## 2013-12-05 ENCOUNTER — Encounter: Payer: Self-pay | Admitting: Internal Medicine

## 2013-12-05 ENCOUNTER — Ambulatory Visit (INDEPENDENT_AMBULATORY_CARE_PROVIDER_SITE_OTHER): Payer: BC Managed Care – PPO | Admitting: Internal Medicine

## 2013-12-05 VITALS — BP 155/82 | HR 63 | Temp 97.7°F | Ht 61.25 in | Wt 166.8 lb

## 2013-12-05 DIAGNOSIS — R109 Unspecified abdominal pain: Secondary | ICD-10-CM

## 2013-12-05 DIAGNOSIS — Z0000 Encounter for general adult medical examination without abnormal findings: Secondary | ICD-10-CM

## 2013-12-05 DIAGNOSIS — R1030 Lower abdominal pain, unspecified: Secondary | ICD-10-CM

## 2013-12-05 DIAGNOSIS — R51 Headache: Secondary | ICD-10-CM

## 2013-12-05 DIAGNOSIS — N809 Endometriosis, unspecified: Secondary | ICD-10-CM

## 2013-12-05 DIAGNOSIS — Z86010 Personal history of colonic polyps: Secondary | ICD-10-CM

## 2013-12-05 LAB — CBC WITH DIFFERENTIAL/PLATELET
Basophils Relative: 0.5 % (ref 0.0–3.0)
Eosinophils Relative: 2.8 % (ref 0.0–5.0)
Hemoglobin: 13.4 g/dL (ref 12.0–15.0)
Lymphocytes Relative: 32.1 % (ref 12.0–46.0)
MCHC: 33.2 g/dL (ref 30.0–36.0)
Neutro Abs: 4 10*3/uL (ref 1.4–7.7)
Neutrophils Relative %: 58.3 % (ref 43.0–77.0)
RBC: 5.05 Mil/uL (ref 3.87–5.11)
WBC: 6.9 10*3/uL (ref 4.5–10.5)

## 2013-12-05 LAB — HEPATIC FUNCTION PANEL
ALT: 14 U/L (ref 0–35)
AST: 16 U/L (ref 0–37)
Albumin: 4 g/dL (ref 3.5–5.2)
Alkaline Phosphatase: 47 U/L (ref 39–117)
Bilirubin, Direct: 0 mg/dL (ref 0.0–0.3)
Total Protein: 7.1 g/dL (ref 6.0–8.3)

## 2013-12-05 LAB — LDL CHOLESTEROL, DIRECT: Direct LDL: 121 mg/dL

## 2013-12-05 LAB — BASIC METABOLIC PANEL
CO2: 30 mEq/L (ref 19–32)
Calcium: 9 mg/dL (ref 8.4–10.5)
Chloride: 102 mEq/L (ref 96–112)
Creatinine, Ser: 0.9 mg/dL (ref 0.4–1.2)
Sodium: 138 mEq/L (ref 135–145)

## 2013-12-05 LAB — TSH: TSH: 1.88 u[IU]/mL (ref 0.35–5.50)

## 2013-12-05 MED ORDER — RIZATRIPTAN BENZOATE 10 MG PO TABS: 10.0000 mg | ORAL_TABLET | ORAL | Status: DC | PRN

## 2013-12-05 MED ORDER — HYOSCYAMINE SULFATE 0.125 MG SL SUBL: 0.1250 mg | SUBLINGUAL_TABLET | SUBLINGUAL | Status: DC | PRN

## 2013-12-05 NOTE — Progress Notes (Signed)
Subjective:    Patient ID: Joanne Morrison, female    DOB: 06/10/1951, 62 y.o.   MRN: [id number]  HPI  She is here for a physical;acute issues include migraine exacerbation & intermittent abdominal pain.     Review of Systems  In the last year she's had some migraine-type headaches. Maxalt has been effective in the past. She's been intolerant to sumatriptan.  She has intermittent suprapubic/lower abdominal spasms described as pain up to a level X, lasting up to 36 hrs. This is not associated with unexplained weight loss, change in stool caliber, melena, or rectal bleeding. Significantly she has a history of total bowel hysterectomy and bilateral salpingo-oophorectomy in May of 2013 for  dysfunctional menses with endometriosis found in the uterus and ovaries.  She does have a history of colon polyps; she is unsure as to her due date for repeat colonoscopy. She is now seeing Dr. Dulce Sellar.     Objective:   Physical Exam  Gen.: Healthy and well-nourished in appearance. Alert, appropriate and cooperative throughout exam.Appears younger than stated age  Head: Normocephalic without obvious abnormalities Eyes: No corneal or conjunctival inflammation noted. Pupils equal round reactive to light and accommodation. Extraocular motion intact.  Ears: External  ear exam reveals no significant lesions or deformities. Canals clear .TMs normal.  Nose: External nasal exam reveals no deformity or inflammation. Nasal mucosa are pink and moist. No lesions or exudates noted.  Mouth: Oral mucosa and oropharynx reveal no lesions or exudates. Teeth in good repair. Neck: No deformities, masses, or tenderness noted. Range of motion  & Thyroid normal. Lungs: Normal respiratory effort; chest expands symmetrically. Lungs are clear to auscultation without rales, wheezes, or increased work of breathing. Heart: Normal rate and rhythm. Normal S1 and S2. No gallop, click, or rub. S4 w/o murmur. Abdomen: Bowel sounds normal;  abdomen soft and nontender. No masses, organomegaly or hernias noted. Genitalia:  as per Gyn                                  Musculoskeletal/extremities: No deformity or scoliosis noted of  the thoracic or lumbar spine.   No clubbing, cyanosis, edema, or significant extremity  deformity noted. Range of motion normal .Tone & strength normal. Hand joints normal . Fingernail  health good. Able to lie down & sit up w/o help. Negative SLR bilaterally Vascular: Carotid, radial artery, dorsalis pedis and  posterior tibial pulses are full and equal. No bruits present. Neurologic: Alert and oriented x3. Deep tendon reflexes symmetrical and normal.      Skin: Intact without suspicious lesions or rashes. Lymph: No cervical, axillary lymphadenopathy present. Psych: Mood and affect are normal. Normally interactive                                                                                        Assessment & Plan:  #1 comprehensive physical exam; no acute findings  #2 migraine headaches; we will attempt to preauthorize Maxalt  #3 lower abdominal pain; irritable bowel versus endometriosis. She is to discuss this with her gynecologist and her gastroenterologist. Levsin/SL  as needed  Plan: see Orders  & Recommendations

## 2013-12-05 NOTE — Progress Notes (Signed)
Pre visit review using our clinic review tool, if applicable. No additional management support is needed unless otherwise documented below in the visit note. 

## 2013-12-05 NOTE — Patient Instructions (Signed)
Your next office appointment will be determined based upon review of your pending labs . Those instructions will be transmitted to you through My Chart  or by mail if you're not using this system.   

## 2013-12-07 ENCOUNTER — Telehealth: Payer: Self-pay | Admitting: *Deleted

## 2013-12-07 NOTE — Telephone Encounter (Signed)
Prior authorization started for rizatriptan. Awaiting response. JG//CMA

## 2013-12-29 ENCOUNTER — Encounter: Payer: Self-pay | Admitting: Internal Medicine

## 2014-08-07 ENCOUNTER — Telehealth: Payer: Self-pay

## 2014-08-07 NOTE — Telephone Encounter (Signed)
Patient's health care provider form has been faxed to [employer] [phone] then form mailed to patient

## 2014-09-29 ENCOUNTER — Other Ambulatory Visit: Payer: Self-pay

## 2014-10-16 ENCOUNTER — Encounter: Payer: Self-pay | Admitting: Internal Medicine

## 2015-01-09 ENCOUNTER — Other Ambulatory Visit: Payer: Self-pay | Admitting: Obstetrics & Gynecology

## 2015-01-10 LAB — CYTOLOGY - PAP

## 2015-07-05 ENCOUNTER — Encounter: Payer: Self-pay | Admitting: Internal Medicine

## 2015-09-19 ENCOUNTER — Other Ambulatory Visit: Payer: Self-pay | Admitting: Gastroenterology

## 2016-09-29 ENCOUNTER — Other Ambulatory Visit: Payer: Self-pay | Admitting: Urology

## 2016-10-07 ENCOUNTER — Other Ambulatory Visit: Payer: Self-pay | Admitting: Physical Medicine and Rehabilitation

## 2016-10-07 DIAGNOSIS — M47816 Spondylosis without myelopathy or radiculopathy, lumbar region: Secondary | ICD-10-CM

## 2016-10-09 ENCOUNTER — Encounter (HOSPITAL_BASED_OUTPATIENT_CLINIC_OR_DEPARTMENT_OTHER): Payer: Self-pay | Admitting: *Deleted

## 2016-10-09 NOTE — Progress Notes (Signed)
NPO AFTER MN.  ARRIVE AT 0745.  NEEDS HG.  WILL TAKE WELLBUTRIN AM DOS W/ SIPS OF WATER.

## 2016-10-15 ENCOUNTER — Encounter (HOSPITAL_BASED_OUTPATIENT_CLINIC_OR_DEPARTMENT_OTHER): Payer: Self-pay

## 2016-10-15 ENCOUNTER — Ambulatory Visit (HOSPITAL_BASED_OUTPATIENT_CLINIC_OR_DEPARTMENT_OTHER)
Admission: RE | Admit: 2016-10-15 | Discharge: 2016-10-15 | Disposition: A | Discharge status: Home / Self Care | Payer: BLUE CROSS/BLUE SHIELD | Source: Ambulatory Visit | Attending: Urology | Admitting: Urology

## 2016-10-15 ENCOUNTER — Encounter (HOSPITAL_BASED_OUTPATIENT_CLINIC_OR_DEPARTMENT_OTHER)
Admission: RE | Disposition: A | Discharge status: Home / Self Care | Payer: Self-pay | Source: Ambulatory Visit | Attending: Urology

## 2016-10-15 ENCOUNTER — Ambulatory Visit (HOSPITAL_BASED_OUTPATIENT_CLINIC_OR_DEPARTMENT_OTHER): Payer: BLUE CROSS/BLUE SHIELD | Admitting: Anesthesiology

## 2016-10-15 DIAGNOSIS — R3989 Other symptoms and signs involving the genitourinary system: Secondary | ICD-10-CM | POA: Diagnosis not present

## 2016-10-15 DIAGNOSIS — N3010 Interstitial cystitis (chronic) without hematuria: Secondary | ICD-10-CM | POA: Insufficient documentation

## 2016-10-15 DIAGNOSIS — Z90710 Acquired absence of both cervix and uterus: Secondary | ICD-10-CM | POA: Insufficient documentation

## 2016-10-15 DIAGNOSIS — Z7982 Long term (current) use of aspirin: Secondary | ICD-10-CM | POA: Diagnosis not present

## 2016-10-15 HISTORY — DX: Unspecified hemorrhoids: K64.9

## 2016-10-15 HISTORY — DX: Other chronic cystitis without hematuria: N30.20

## 2016-10-15 HISTORY — DX: Personal history of other diseases of the female genital tract: Z87.42

## 2016-10-15 HISTORY — DX: Other chronic pain: G89.29

## 2016-10-15 HISTORY — DX: Unspecified osteoarthritis, unspecified site: M19.90

## 2016-10-15 HISTORY — DX: Anxiety disorder, unspecified: F41.9

## 2016-10-15 HISTORY — DX: Low back pain, unspecified: M54.50

## 2016-10-15 HISTORY — DX: Personal history of colon polyps, unspecified: Z86.0100

## 2016-10-15 HISTORY — DX: Pelvic and perineal pain: R10.2

## 2016-10-15 HISTORY — DX: Personal history of colonic polyps: Z86.010

## 2016-10-15 HISTORY — PX: CYSTO WITH HYDRODISTENSION: SHX5453

## 2016-10-15 HISTORY — DX: Other symptoms and signs involving the genitourinary system: R39.89

## 2016-10-15 HISTORY — DX: Low back pain: M54.5

## 2016-10-15 HISTORY — DX: Presence of spectacles and contact lenses: Z97.3

## 2016-10-15 HISTORY — DX: Personal history of other mental and behavioral disorders: Z86.59

## 2016-10-15 HISTORY — DX: Diverticulosis of large intestine without perforation or abscess without bleeding: K57.30

## 2016-10-15 LAB — POCT HEMOGLOBIN-HEMACUE: Hemoglobin: 12.5 g/dL (ref 12.0–15.0)

## 2016-10-15 SURGERY — CYSTOSCOPY, WITH BLADDER HYDRODISTENSION
Anesthesia: General | Site: Bladder

## 2016-10-15 MED ORDER — PROPOFOL 10 MG/ML IV BOLUS
INTRAVENOUS | Status: AC
  Filled 2016-10-15: qty 40

## 2016-10-15 MED ORDER — CEFAZOLIN SODIUM-DEXTROSE 2-4 GM/100ML-% IV SOLN
2.0000 g | INTRAVENOUS | Status: AC
  Administered 2016-10-15: 2 g via INTRAVENOUS
  Filled 2016-10-15: qty 100

## 2016-10-15 MED ORDER — PROPOFOL 10 MG/ML IV BOLUS
INTRAVENOUS | Status: DC | PRN
  Administered 2016-10-15: 50 mg via INTRAVENOUS
  Administered 2016-10-15: 150 mg via INTRAVENOUS

## 2016-10-15 MED ORDER — KETOROLAC TROMETHAMINE 30 MG/ML IJ SOLN
INTRAMUSCULAR | Status: DC | PRN
  Administered 2016-10-15: 30 mg via INTRAVENOUS

## 2016-10-15 MED ORDER — LIDOCAINE HCL 2 % EX GEL
CUTANEOUS | Status: DC | PRN
  Administered 2016-10-15: 1

## 2016-10-15 MED ORDER — FENTANYL CITRATE (PF) 100 MCG/2ML IJ SOLN
INTRAMUSCULAR | Status: AC
  Filled 2016-10-15: qty 2

## 2016-10-15 MED ORDER — LIDOCAINE 2% (20 MG/ML) 5 ML SYRINGE
INTRAMUSCULAR | Status: DC | PRN
  Administered 2016-10-15: 60 mg via INTRAVENOUS

## 2016-10-15 MED ORDER — CEFAZOLIN SODIUM-DEXTROSE 2-4 GM/100ML-% IV SOLN
INTRAVENOUS | Status: AC
  Filled 2016-10-15: qty 100

## 2016-10-15 MED ORDER — MIDAZOLAM HCL 2 MG/2ML IJ SOLN
INTRAMUSCULAR | Status: AC
Start: 2016-10-15 — End: 2016-10-15
  Filled 2016-10-15: qty 2

## 2016-10-15 MED ORDER — STERILE WATER FOR IRRIGATION IR SOLN
Status: DC | PRN
  Administered 2016-10-15: 3000 mL

## 2016-10-15 MED ORDER — USTELL 120 MG PO CAPS: 1.0000 | ORAL_CAPSULE | Freq: Three times a day (TID) | ORAL | 0 refills | Status: DC | PRN

## 2016-10-15 MED ORDER — ONDANSETRON HCL 4 MG/2ML IJ SOLN
INTRAMUSCULAR | Status: DC | PRN
  Administered 2016-10-15: 4 mg via INTRAVENOUS

## 2016-10-15 MED ORDER — DEXAMETHASONE SODIUM PHOSPHATE 4 MG/ML IJ SOLN
INTRAMUSCULAR | Status: DC | PRN
  Administered 2016-10-15: 10 mg via INTRAVENOUS

## 2016-10-15 MED ORDER — KETOROLAC TROMETHAMINE 30 MG/ML IJ SOLN
INTRAMUSCULAR | Status: AC
  Filled 2016-10-15: qty 1

## 2016-10-15 MED ORDER — MIDAZOLAM HCL 5 MG/5ML IJ SOLN
INTRAMUSCULAR | Status: DC | PRN
  Administered 2016-10-15: 2 mg via INTRAVENOUS

## 2016-10-15 MED ORDER — LIDOCAINE 2% (20 MG/ML) 5 ML SYRINGE
INTRAMUSCULAR | Status: AC
Start: 2016-10-15 — End: 2016-10-15
  Filled 2016-10-15: qty 5

## 2016-10-15 MED ORDER — HYDROCODONE-ACETAMINOPHEN 5-325 MG PO TABS: 1.0000 | ORAL_TABLET | Freq: Four times a day (QID) | ORAL | 0 refills | Status: DC | PRN

## 2016-10-15 MED ORDER — LACTATED RINGERS IV SOLN
INTRAVENOUS | Status: DC
  Administered 2016-10-15: 08:00:00 via INTRAVENOUS
  Filled 2016-10-15: qty 1000

## 2016-10-15 MED ORDER — BUPIVACAINE HCL (PF) 0.5 % IJ SOLN
INTRAMUSCULAR | Status: DC | PRN
  Administered 2016-10-15: 30 mL

## 2016-10-15 MED ORDER — DEXAMETHASONE SODIUM PHOSPHATE 10 MG/ML IJ SOLN
INTRAMUSCULAR | Status: AC
  Filled 2016-10-15: qty 1

## 2016-10-15 MED ORDER — ONDANSETRON HCL 4 MG/2ML IJ SOLN
INTRAMUSCULAR | Status: AC
  Filled 2016-10-15: qty 2

## 2016-10-15 MED ORDER — HYDROMORPHONE HCL 1 MG/ML IJ SOLN
0.2500 mg | INTRAMUSCULAR | Status: DC | PRN
  Filled 2016-10-15: qty 0.5

## 2016-10-15 MED ORDER — FENTANYL CITRATE (PF) 100 MCG/2ML IJ SOLN
INTRAMUSCULAR | Status: DC | PRN
  Administered 2016-10-15: 50 ug via INTRAVENOUS

## 2016-10-15 SURGICAL SUPPLY — 25 items
BAG DRAIN URO-CYSTO SKYTR STRL (DRAIN) ×3 IMPLANT
BAG DRN UROCATH (DRAIN) ×1
CATH ROBINSON RED A/P 14FR (CATHETERS) IMPLANT
CATH ROBINSON RED A/P 16FR (CATHETERS) ×2 IMPLANT
CATH ROBINSON RED A/P 18FR (CATHETERS) ×3 IMPLANT
CLOTH BEACON ORANGE TIMEOUT ST (SAFETY) ×3 IMPLANT
ELECT REM PT RETURN 9FT ADLT (ELECTROSURGICAL) ×3
ELECTRODE REM PT RTRN 9FT ADLT (ELECTROSURGICAL) ×1 IMPLANT
GLOVE BIO SURGEON STRL SZ7.5 (GLOVE) ×3 IMPLANT
GLOVE BIOGEL PI IND STRL 7.5 (GLOVE) IMPLANT
GLOVE BIOGEL PI INDICATOR 7.5 (GLOVE) ×4
GOWN STRL REUS W/ TWL LRG LVL3 (GOWN DISPOSABLE) ×1 IMPLANT
GOWN STRL REUS W/ TWL XL LVL3 (GOWN DISPOSABLE) ×1 IMPLANT
GOWN STRL REUS W/TWL LRG LVL3 (GOWN DISPOSABLE) ×2 IMPLANT
GOWN STRL REUS W/TWL XL LVL3 (GOWN DISPOSABLE) ×3
KIT ROOM TURNOVER WOR (KITS) ×3 IMPLANT
MANIFOLD NEPTUNE II (INSTRUMENTS) ×2 IMPLANT
NEEDLE HYPO 22GX1.5 SAFETY (NEEDLE) IMPLANT
NS IRRIG 500ML POUR BTL (IV SOLUTION) IMPLANT
PACK CYSTO (CUSTOM PROCEDURE TRAY) ×3 IMPLANT
SYR BULB IRRIGATION 50ML (SYRINGE) IMPLANT
TUBE CONNECTING 12'X1/4 (SUCTIONS)
TUBE CONNECTING 12X1/4 (SUCTIONS) IMPLANT
WATER STERILE IRR 3000ML UROMA (IV SOLUTION) ×3 IMPLANT
WATER STERILE IRR 500ML POUR (IV SOLUTION) IMPLANT

## 2016-10-15 NOTE — H&P (Signed)
--------------------------------------------------------------------------------   Joanne Morrison  MRN: [id number]  PRIMARY CARE:  Jeannette How. Perini, MD  DOB: [birth year], 65 year old Female  REFERRING:    SSN: *  PROVIDER:  Rana Snare, M.D.    TREATING:  Jimmey Ralph    LOCATION:  [facility] [phone]   --------------------------------------------------------------------------------   CC: I have pain in the bladder.  HPI: Joanne Morrison is a 65 year-old female established patient who is here for bladder pain.  Has severe bladder pressure and difficulty voiding. Has seen no improvement of sxs with increasing Tamsulosin to BID. Yesterday states she "was so desperate she took 3 Flomax vs 2 and still no change." Has self treated also with OTC AZO with very minimal relief.    The patient states the nature of her problem(s) is pain. Her symptoms have been present for 4 months. She urinates every hour in the daytime. She has nocturia 5+ times per night. She does have urinary urgency. She denies daytime urinary leakage. She leaks urine with exercising.     ALLERGIES: No Allergies    MEDICATIONS: Aspirin 325 mg tablet 1 tablet PO Daily  Tamsulosin Hcl 0.4 mg capsule, ext release 24 hr 2 capsule PO Daily  Azo 1 PO Daily  BuPROPion HCl TABS Oral  Daily Multiple Vitamin 1 PO Daily  DiazePAM TABS Oral  Estradiol 0.075 MG/24HR Transdermal Patch Twice Weekly Transdermal  Hydrocodone-Acetaminophen TABS Oral  Zolpidem Tartrate TABS Oral     GU PSH: Hysterectomy Unilat SO - 2016      PSH Notes: Hand Surgery  Bladder Surgery  Foot Surgery   NON-GU PSH: None   GU PMH: Urinary Hesitancy, Hesitancy - 05/01/2015 Mixed incontinence, Urge and stress incontinence - 2016 Overactive bladder, Overactive bladder - 2016 Urinary Retention, Unspec, Incomplete bladder emptying - 2016    NON-GU PMH: Anxiety, Anxiety - 2016 Encounter for general adult medical examination without  abnormal findings, Encounter for preventive health examination - 2016 Personal history of other diseases of the musculoskeletal system and connective tissue, History of arthritis - 2016 Personal history of other mental and behavioral disorders, History of depression - 2016    FAMILY HISTORY: Anxiety - Runs In Family Hypertension - Runs In Family Kidney Stones - Runs In Family Polymyalgia - Runs In Family   SOCIAL HISTORY: Marital Status: Married Current Smoking Status: Patient has never smoked.  Has never drank.  Does not drink caffeine. Patient's occupation is/was Retired.    REVIEW OF SYSTEMS:    GU Review Female:   Patient reports frequent urination, hard to postpone urination, get up at night to urinate, stream starts and stops, trouble starting your stream, and have to strain to urinate. Patient denies burning /pain with urination, leakage of urine, and currently pregnant.  Gastrointestinal (Upper):   Patient denies nausea, vomiting, and indigestion/ heartburn.  Gastrointestinal (Lower):   Patient denies diarrhea and constipation.  Constitutional:   Patient denies fever, night sweats, weight loss, and fatigue.  Skin:   Patient denies skin rash/ lesion and itching.  Eyes:   Patient denies double vision and blurred vision.  Ears/ Nose/ Throat:   Patient denies sore throat and sinus problems.  Hematologic/Lymphatic:   Patient denies swollen glands and easy bruising.  Cardiovascular:   Patient denies leg swelling and chest pains.  Respiratory:   Patient denies cough and shortness of breath.  Endocrine:   Patient denies excessive thirst.  Musculoskeletal:   Patient reports joint pain. Patient denies back  pain.  Neurological:   Patient denies headaches and dizziness.  Psychologic:   Patient reports depression and anxiety.    VITAL SIGNS: None   MULTI-SYSTEM PHYSICAL EXAMINATION:    Constitutional: Well-nourished. No physical deformities. Normally developed. Good grooming.    Neurologic / Psychiatric: Patient anxious. Oriented to time, oriented to place, oriented to person. No depression, no agitation.   Gastrointestinal: No mass, no tenderness, no rigidity, non obese abdomen.      PAST DATA REVIEWED:  Source Of History:  Patient  Records Review:   Previous Patient Records  Urine Test Review:   Urinalysis   PROCEDURES:           PVR Ultrasound - [id number]  Scanned Volume: 0 cc         Anesthetic Cocktail - 51700 Patient cathed with a 65fr RR and bladder completely drained. 25cc marcaine was instilled into patients bladder without trouble. Patient was instructed not to void for 2 hours.            Urinalysis w/Scope - 81001 Dipstick Dipstick Cont'd Micro  Specimen: Voided Bilirubin: Invalid WBC/hpf: 0-5/hpf  Color: Orange Ketones: Invalid RBC/hpf: 0-2/hpf  Appearance: Cloudy Blood: Invalid Bacteria: NS (Not Seen)  Specific Gravity: Invalid Protein: Invalid Cystals: NS (Not Seen)  pH: Invalid Urobilinogen: Invalid Casts: NS (Not Seen)  Glucose: Invalid Nitrites: Invalid Trichomonas: Not Present    Leukocyte Esterase: Invalid Mucous: Not Present      Epithelial Cells: 0-5/hpf      Yeast: NS (Not Seen)      Sperm: Not Present         Ketoralac 30mg  - [id number], [id number] Qty: 30 Adm. By: Vicenta Dunning  Unit: mg Lot No [id number]  Route: IM Exp. Date 12/15/2017  Freq: None Mfgr.:   Site: Right Buttock   ASSESSMENT:      ICD-10 Details  1 GU:   Chronic bladder pain - R39.82 Worsening, Chronic - Ketorolac 30 mg IM today Ketorolac 10 mg 1 po Q6 hrs prn Marcaine 0.5% bladder instillation Instructed to RTC PRN bladder instillation. May need to resume DMOS treatments Also she may benefit from PT/OT referral if pain persists.    PLAN:            Medications New Meds: Ketorolac Tromethamine 10 mg tablet 1 tablet PO Q 6 H PRN   #20  0 Refill(s)            Orders Labs Urine Culture and Sensitivity          Schedule Return Visit: Return PRN  Procedure:  09/11/2016 at [facility] - Tensas 30mg  (Toradol Per 15 Mg) - [id number], [id number]  Procedure: 09/11/2016 at [facility] - [id number] - Anesthetic Cocktail (Bladder Lavage Irrigation) - 51700 Notes: Marcaine 0.5% 25 ml          Document Letter(s):  Created for Patient: Clinical Summary         Notes:   If she has good relief with bladder instillation may RTC PRN Marcaine instillations  If she continues with chronic bladder pain will refer to PT/OT  F/U PRN   Patient subsequently contacted the office requesting hydraulic overdistention of the bladder under anesthesia.  She is continued to have fairly severe ongoing chronic bladder pressure.  As a benefits of this approach were discussed with her.  She presents now for cystoscopy with hydraulic overdistention of the bladder.  She likely will have instillation of Marcaine.

## 2016-10-15 NOTE — Interval H&P Note (Signed)
History and Physical Interval Note:  10/15/2016 10:04 AM  [name]  has presented today for surgery, with the diagnosis of PELVIC PAIN  The various methods of treatment have been discussed with the patient and family. After consideration of risks, benefits and other options for treatment, the patient has consented to  Procedure(s): CYSTOSCOPY/HYDRODISTENSION AND MARCAINE INSTILLATION ([name]) as a surgical intervention .  The patient's history has been reviewed, patient examined, no change in status, stable for surgery.  I have reviewed the patient's chart and labs.  Questions were answered to the patient's satisfaction.     Joanne Morrison S

## 2016-10-15 NOTE — Discharge Instructions (Signed)
Cystoscopy patient instructions  Following a cystoscopy, a catheter (a flexible rubber tube) is sometimes left in place to empty the bladder. This may cause some discomfort or a feeling that you need to urinate. Your doctor determines the period of time that the catheter will be left in place. You may have bloody urine for two to three days (Call your doctor if the amount of bleeding increases or does not subside).  You may pass blood clots in your urine, especially if you had a biopsy. It is not unusual to pass small blood clots and have some bloody urine a couple of weeks after your cystoscopy. Again, call your doctor if the bleeding does not subside. You may have: Dysuria (painful urination) Frequency (urinating often) Urgency (strong desire to urinate)  These symptoms are common especially if medicine is instilled into the bladder or a ureteral stent is placed. Avoiding alcohol and caffeine, such as coffee, tea, and chocolate, may help relieve these symptoms. Drink plenty of water, unless otherwise instructed. Your doctor may also prescribe an antibiotic or other medicine to reduce these symptoms.  Cystoscopy results are available soon after the procedure; biopsy results usually take two to four days. Your doctor will discuss the results of your exam with you. Before you go home, you will be given specific instructions for follow-up care. Special Instructions:  1 If you are going home with a catheter in place do not take a tub bath until removed by your doctor.  2 You may resume your normal activities.  3 Do not drive or operate machinery if you are taking narcotic pain medicine.  4 Be sure to keep all follow-up appointments with your doctor.   5 Call Your Doctor If: The catheter is not draining  You have severe pain  You are unable to urinate  You have a fever over 101  You have severe bleeding         CYSTOSCOPY HOME CARE INSTRUCTIONS  Activity: Rest for the remainder of the  day.  Do not drive or operate equipment today.  You may resume normal activities in one to two days as instructed by your physician.   Meals: Drink plenty of liquids and eat light foods such as gelatin or soup this evening.  You may return to a normal meal plan tomorrow.  Return to Work: You may return to work in one to two days or as instructed by your physician.  Special Instructions / Symptoms: Call your physician if any of these symptoms occur:   -persistent or heavy bleeding  -bleeding which continues after first few urination  -large blood clots that are difficult to pass  -urine stream diminishes or stops completely  -fever equal to or higher than 101 degrees Farenheit.  -cloudy urine with a strong, foul odor  -severe pain  Females should always wipe from front to back after elimination.  You may feel some burning pain when you urinate.  This should disappear with time.  Applying moist heat to the lower abdomen or a hot tub bath may help relieve the pain. \   Post Anesthesia Home Care Instructions  Activity: Get plenty of rest for the remainder of the day. A responsible adult should stay with you for 24 hours following the procedure.  For the next 24 hours, DO NOT: -Drive a car -Paediatric nurse -Drink alcoholic beverages -Take any medication unless instructed by your physician -Make any legal decisions or sign important papers.  Meals: Start with liquid foods such  as gelatin or soup. Progress to regular foods as tolerated. Avoid greasy, spicy, heavy foods. If nausea and/or vomiting occur, drink only clear liquids until the nausea and/or vomiting subsides. Call your physician if vomiting continues.  Special Instructions/Symptoms: Your throat may feel dry or sore from the anesthesia or the breathing tube placed in your throat during surgery. If this causes discomfort, gargle with warm salt water. The discomfort should disappear within 24 hours.  If you had a scopolamine  patch placed behind your ear for the management of post- operative nausea and/or vomiting:  1. The medication in the patch is effective for 72 hours, after which it should be removed.  Wrap patch in a tissue and discard in the trash. Wash hands thoroughly with soap and water. 2. You may remove the patch earlier than 72 hours if you experience unpleasant side effects which may include dry mouth, dizziness or visual disturbances. 3. Avoid touching the patch. Wash your hands with soap and water after contact with the patch.

## 2016-10-15 NOTE — Transfer of Care (Signed)
Immediate Anesthesia Transfer of Care Note  Patient: Joanne Morrison  Procedure(s) Performed: Procedure(s): CYSTOSCOPY/HYDRODISTENSION AND MARCAINE INSTILLATION ([name])  Patient Location: PACU  Anesthesia Type:General  Level of Consciousness: awake, alert  and oriented  Airway & Oxygen Therapy: Patient Spontanous Breathing and Patient connected to nasal cannula oxygen  Post-op Assessment: Report given to RN  Post vital signs: Reviewed and stable  Last Vitals: 164/86, 69, 24, 100%, 96.6 Vitals:   10/15/16 0743  BP: (!) 148/76  Pulse: 63  Resp: 16  Temp: 36.4 C    Last Pain:  Vitals:   10/15/16 0743  TempSrc: Oral      Patients Stated Pain Goal: 3 ([phone])  Complications: No apparent anesthesia complications

## 2016-10-15 NOTE — Anesthesia Preprocedure Evaluation (Signed)
Anesthesia Evaluation  Patient identified by MRN, date of birth, ID band Patient awake    Reviewed: Allergy & Precautions, NPO status , Patient's Chart, lab work & pertinent test results  Airway Mallampati: II  TM Distance: >3 FB     Dental   Pulmonary    breath sounds clear to auscultation       Cardiovascular negative cardio ROS   Rhythm:Regular Rate:Normal     Neuro/Psych  Headaches,    GI/Hepatic negative GI ROS, Neg liver ROS,   Endo/Other  negative endocrine ROS  Renal/GU negative Renal ROS     Musculoskeletal   Abdominal   Peds  Hematology   Anesthesia Other Findings   Reproductive/Obstetrics                             Anesthesia Physical Anesthesia Plan  ASA: II  Anesthesia Plan: General   Post-op Pain Management:    Induction: Intravenous  Airway Management Planned: LMA  Additional Equipment:   Intra-op Plan:   Post-operative Plan: Extubation in OR  Informed Consent: I have reviewed the patients History and Physical, chart, labs and discussed the procedure including the risks, benefits and alternatives for the proposed anesthesia with the patient or authorized representative who has indicated his/her understanding and acceptance.   Dental advisory given  Plan Discussed with: CRNA and Anesthesiologist  Anesthesia Plan Comments:         Anesthesia Quick Evaluation

## 2016-10-15 NOTE — Op Note (Signed)
Preoperative diagnosis:  Painful bladder syndrome Postoperative diagnosis: Same Procedure:  Cystoscopy and hydraulic overdistention of the bladder with instillation of Marcaine Surgeon Bernestine Amass, MD Anesthesia: General  Indication: Joanne Morrison has been seen and evaluated in our office several times for bladder pressure/pain as well as some irritative voiding symptoms. She is felt to potentially have interstitial cystitis. She presents today to rule out other pathology and to undergo hydraulic overdistention of her bladder for both diagnostic and therapeutic purposes..  Technique and findings:   The patient was brought to the operating room where [facility] successful induction of general anesthesia.The patient was placed in lithotomy position and prepped and draped in usual manner. Appropriate surgical timeout was performed.The patient received perioperative antibiotics. Initial cystoscopy revealed no abnormalities of the bladder. The patient underwent hydraulic overdistention of the bladder to 100 cm of water pressure for 5 minutes. Capacity was felt to be 800 cc. The patient had diffuse 3+ glomerular bleeding noted consistent with the diagnosis of interstitial cystitis. Marcaine was then instilled in the bladder.The patient was brought to recovery room in stable condition having no obvious complications or problems.   Bernestine Amass, MD 10/15/2016, 10:04 AM

## 2016-10-15 NOTE — Anesthesia Postprocedure Evaluation (Signed)
Anesthesia Post Note  Patient: Joanne Morrison  Procedure(s) Performed: Procedure(s) (LRB): CYSTOSCOPY/HYDRODISTENSION AND MARCAINE INSTILLATION ([name])  Patient location during evaluation: PACU Anesthesia Type: General Level of consciousness: awake Pain management: pain level controlled Vital Signs Assessment: post-procedure vital signs reviewed and stable Respiratory status: spontaneous breathing Cardiovascular status: stable Anesthetic complications: no    Last Vitals:  Vitals:   10/15/16 1015 10/15/16 1030  BP: (!) 159/85 (!) 165/85  Pulse: 66 61  Resp: 13 14  Temp:      Last Pain:  Vitals:   10/15/16 0743  TempSrc: Oral                 EDWARDS,Andromeda Poppen

## 2016-10-16 ENCOUNTER — Encounter (HOSPITAL_BASED_OUTPATIENT_CLINIC_OR_DEPARTMENT_OTHER): Payer: Self-pay | Admitting: Urology

## 2016-10-18 ENCOUNTER — Ambulatory Visit
Admission: RE | Admit: 2016-10-18 | Discharge: 2016-10-18 | Disposition: A | Discharge status: Home / Self Care | Payer: BLUE CROSS/BLUE SHIELD | Source: Ambulatory Visit | Attending: Physical Medicine and Rehabilitation | Admitting: Physical Medicine and Rehabilitation

## 2016-10-18 DIAGNOSIS — M47816 Spondylosis without myelopathy or radiculopathy, lumbar region: Secondary | ICD-10-CM

## 2016-10-27 ENCOUNTER — Other Ambulatory Visit: Payer: Self-pay

## 2017-01-11 ENCOUNTER — Emergency Department (HOSPITAL_COMMUNITY)
Admission: EM | Admit: 2017-01-11 | Discharge: 2017-01-12 | Disposition: A | Discharge status: Home / Self Care | Payer: BLUE CROSS/BLUE SHIELD | Attending: Emergency Medicine | Admitting: Emergency Medicine

## 2017-01-11 ENCOUNTER — Encounter (HOSPITAL_COMMUNITY): Payer: Self-pay | Admitting: *Deleted

## 2017-01-11 DIAGNOSIS — R531 Weakness: Secondary | ICD-10-CM | POA: Insufficient documentation

## 2017-01-11 DIAGNOSIS — R404 Transient alteration of awareness: Secondary | ICD-10-CM | POA: Diagnosis not present

## 2017-01-11 DIAGNOSIS — R200 Anesthesia of skin: Secondary | ICD-10-CM | POA: Diagnosis not present

## 2017-01-11 DIAGNOSIS — Z7982 Long term (current) use of aspirin: Secondary | ICD-10-CM | POA: Diagnosis not present

## 2017-01-11 DIAGNOSIS — M6281 Muscle weakness (generalized): Secondary | ICD-10-CM | POA: Diagnosis not present

## 2017-01-11 LAB — URINALYSIS, ROUTINE W REFLEX MICROSCOPIC
Bilirubin Urine: NEGATIVE
Glucose, UA: NEGATIVE mg/dL
Hgb urine dipstick: NEGATIVE
Ketones, ur: 5 mg/dL — AB
LEUKOCYTES UA: NEGATIVE
NITRITE: NEGATIVE
PROTEIN: NEGATIVE mg/dL
Specific Gravity, Urine: 1.008 (ref 1.005–1.030)
pH: 9 — ABNORMAL HIGH (ref 5.0–8.0)

## 2017-01-11 LAB — CBC
HCT: 42.2 % (ref 36.0–46.0)
Hemoglobin: 13.9 g/dL (ref 12.0–15.0)
MCH: 26.4 pg (ref 26.0–34.0)
MCHC: 32.9 g/dL (ref 30.0–36.0)
MCV: 80.2 fL (ref 78.0–100.0)
Platelets: 311 10*3/uL (ref 150–400)
RBC: 5.26 MIL/uL — AB (ref 3.87–5.11)
RDW: 13.7 % (ref 11.5–15.5)
WBC: 10 10*3/uL (ref 4.0–10.5)

## 2017-01-11 LAB — BASIC METABOLIC PANEL
Anion gap: 9 (ref 5–15)
BUN: 18 mg/dL (ref 6–20)
CALCIUM: 9.1 mg/dL (ref 8.9–10.3)
CO2: 23 mmol/L (ref 22–32)
CREATININE: 1.65 mg/dL — AB (ref 0.44–1.00)
Chloride: 105 mmol/L (ref 101–111)
GFR calc Af Amer: 37 mL/min — ABNORMAL LOW (ref >60)
GFR, EST NON AFRICAN AMERICAN: 32 mL/min — AB (ref >60)
Glucose, Bld: 129 mg/dL — ABNORMAL HIGH (ref 65–99)
Potassium: 3.9 mmol/L (ref 3.5–5.1)
SODIUM: 137 mmol/L (ref 135–145)

## 2017-01-11 LAB — CK: CK TOTAL: 123 U/L (ref 38–234)

## 2017-01-11 MED ORDER — SODIUM CHLORIDE 0.9 % IV BOLUS (SEPSIS)
1000.0000 mL | Freq: Once | INTRAVENOUS | Status: AC
  Administered 2017-01-11: 1000 mL via INTRAVENOUS

## 2017-01-11 NOTE — ED Notes (Signed)
ED Provider at bedside. 

## 2017-01-11 NOTE — ED Provider Notes (Signed)
[facility] DEPT Provider Note   CSN: [id number] Arrival date & time: 01/11/17  2201   By signing my name below, I, Joanne Morrison, attest that this documentation has been prepared under the direction and in the presence of Joanne Fraise, MD . Electronically Signed: [name], ED Scribe. 01/11/2017. 11:34 PM.   History   Chief Complaint Chief Complaint  Patient presents with  . Weakness    The history is provided by the patient. No language interpreter was used.  Weakness  This is a new problem. The current episode started yesterday. The problem has not changed since onset.There was left lower extremity and right lower extremity focality noted. There has been no fever. Pertinent negatives include no shortness of breath, no vomiting, no altered mental status, no confusion and no headaches.  HPI Comments:  Joanne Morrison is a 66 y.o. female with PMHx of chronic back pain who presents to the Emergency Department complaining of sudden onset, constant leg weakness that began at 1900 yesterday. Pt states that she has been unable to walk since the onset of weakness, and that the left side is more weak than the right side. Pt complains of associated bilateral hand/foot tingling, states that she had a short episode of chest pain this morning that has since resolved, and also reports that when she coughs she loses control of her bladder. Pt reports that her husband was sick 10 days ago, and then was she diagnosed with strep throat of 01/03/17 and also had a severe cough, and was then diagnosed with pneumonia early last week. Pt was treated with a breathing treatment, rocephin shot, 3 pills of erythromycin, and prednisone 2 days ago. She states that after she took prednisone for strep and pneumonia symptoms resolved completely yesterday and then suddenly at 1900 yesterday her legs became weak causing her knees to buckle. Pt called her doctor this AM and had an albuterol prescription  called in. Pt is scheduled for chest xray tomorrow. Pt states that prior to her husband getting sick she was otherwise completely healthy. Pt denies any hx of back surgery. Pt was not treated with tamiflu. Pt denies vomiting, diarrhea, abdominal pain, dysuria, abnormal BM, back pain, neck pain, sore throat, joint pain, arm numbness. No incontinence reported   Past Medical History:  Diagnosis Date  . Anxiety   . Arthritis    back  . Chronic cystitis   . Chronic low back pain   . Depression   . Diverticulosis of colon   . Hemorrhoids   . History of colon polyps    09-24-2009  rectal hyperplastic polyp/   and 2016 non-cancerous  . History of endometriosis   . History of panic attacks   . Hyperlipidemia   . Osteopenia   . Pelvic pain   . Sensation of pressure in bladder area   . Wears glasses     Patient Active Problem List   Diagnosis Date Noted  . Endometriosis 12/05/2013  . Chronic LBP 08/19/2012  . HYPERLIPIDEMIA 12/10/2010  . COLONIC POLYPS, HX OF 12/13/2009  . HYPERGLYCEMIA, FASTING 11/22/2008  . DEPRESSIVE DISORDER 02/07/2008  . ANOSMIA 02/07/2008  . HEADACHE, CHRONIC 02/07/2008  . MITRAL VALVE PROLAPSE 10/05/2007  . OSTEOPENIA 10/05/2007    Past Surgical History:  Procedure Laterality Date  . BUNIONECTOMY Left 2004 approx  . CARPAL TUNNEL RELEASE Bilateral right 2014;  left 2007  . COLONOSCOPY W/ POLYPECTOMY  09/24/2009  . CYSTO WITH HYDRODISTENSION [name] 10/15/2016   Procedure: CYSTOSCOPY/HYDRODISTENSION AND  MARCAINE INSTILLATION;  Surgeon: Rana Snare, MD;  Location: [facility];  Service: Urology;  Laterality: [name];  . CYSTO/  HYDRODISTENTION/  INSTILLATION THERAPY  1997 approx  . HYSTEROSCOPY W/D&C  11/01/2004   POLYPECTOMY  . LAPAROSCOPIC VAGINAL HYSTERECTOMY WITH SALPINGO OOPHORECTOMY Bilateral 04/20/2012  . TUBAL LIGATION  yrs ago    OB History    Gravida Para Term Preterm AB Living   1 1 1     1    SAB TAB Ectopic Multiple Live Births            1       Home Medications    Prior to Admission medications   Medication Sig Start Date End Date Taking? Authorizing Provider  Alum Hydroxide-Mag Carbonate (GAVISCON PO) Take by mouth as needed.    Historical Provider, MD  Ascorbic Acid (VITAMIN C) 1000 MG tablet Take 1,000 mg by mouth daily.    Historical Provider, MD  aspirin EC 325 MG tablet Take 325 mg by mouth daily.    Historical Provider, MD  buPROPion (WELLBUTRIN XL) 150 MG 24 hr tablet Take 150 mg by mouth every morning.    Historical Provider, MD  Cholecalciferol (VITAMIN D3) 2000 units TABS Take 2 capsules by mouth daily.    Historical Provider, MD  diazepam (VALIUM) 10 MG tablet Take 10 mg by mouth every 12 (twelve) hours as needed for anxiety.    Historical Provider, MD  Fish Oil-Cholecalciferol (OMEGA-3 FISH OIL-VITAMIN D3) 1200-1000 MG-UNIT CAPS Take 1 capsule by mouth daily.    Historical Provider, MD  HYDROcodone-acetaminophen (NORCO/VICODIN) 5-325 MG tablet Take 1 tablet by mouth every 6 (six) hours as needed for moderate pain.    Historical Provider, MD  HYDROcodone-acetaminophen (NORCO/VICODIN) 5-325 MG tablet Take 1-2 tablets by mouth every 6 (six) hours as needed. 10/15/16   Rana Snare, MD  Magnesium 250 MG TABS Take 850 mg by mouth every morning.    Historical Provider, MD  Meth-Hyo-M Bl-Na Phos-Ph Sal (USTELL) 120 MG CAPS Take 1 capsule (120 mg total) by mouth 3 (three) times daily as needed. 10/15/16   Rana Snare, MD  Multiple Vitamin (MULTIVITAMIN) tablet Take 1 tablet by mouth daily.    Historical Provider, MD  Potassium 95 MG TABS Take 1 tablet by mouth daily.    Historical Provider, MD  Probiotic Product (PROBIOTIC DAILY) CAPS Take 1 capsule by mouth daily.    Historical Provider, MD  rizatriptan (MAXALT) 10 MG tablet Take 1 tablet (10 mg total) by mouth as needed. May repeat in 2 hours if needed 12/05/13   Hendricks Limes, MD  zolpidem (AMBIEN) 10 MG tablet Take 10 mg by mouth at bedtime.    Historical  Provider, MD    Family History Family History  Problem Relation Age of Onset  . Hypertension Mother   . Dementia Mother   . Lung cancer Mother     smoker  . Osteoporosis Mother   . Heart attack Father 30  . Hypertension Brother   . Diabetes Maternal Aunt   . Cancer Maternal Uncle     RENAL  . Heart attack Maternal Grandmother 86  . Lung cancer Maternal Grandfather     Ecologist  . Pulmonary embolism Brother     ? etiology  . Anesthesia problems Neg Hx   . Stroke Neg Hx     Social History Social History  Substance Use Topics  . Smoking status: Never Smoker  . Smokeless tobacco: Never Used  .  Alcohol use No     Allergies   Sumatriptan and Olanzapine   Review of Systems Review of Systems  Constitutional: Negative for fever.  HENT: Negative for sore throat.   Respiratory: Negative for shortness of breath.   Gastrointestinal: Negative for abdominal pain, nausea and vomiting.  Genitourinary: Negative for dysuria.  Musculoskeletal: Negative for arthralgias, back pain and neck pain.  Neurological: Positive for weakness and numbness. Negative for headaches.  Psychiatric/Behavioral: Negative for confusion.  All other systems reviewed and are negative.    Physical Exam Updated Vital Signs BP 156/78 (BP Location: Right Arm)   Pulse 68   Temp 97.8 F (36.6 C) (Oral)   Resp 15   SpO2 100%   Physical Exam CONSTITUTIONAL: Well developed/well nourished HEAD: Normocephalic/atraumatic EYES: EOMI/PERRL ENMT: Mucous membranes moist; uvula midline, no exudates noted NECK: supple no meningeal signs SPINE/BACK:entire spine nontender CV: S1/S2 noted, no murmurs/rubs/gallops noted LUNGS: Lungs are clear to auscultation bilaterally, no apparent distress ABDOMEN: soft, nontender, no rebound or guarding, bowel sounds noted throughout abdomen GU:no cva tenderness NEURO:  Pt is awake/alert/appropriate, No facial droop.   Upper extremities - bilateral weakness noted with,  left >right Lower extremities - 4/5 strength with hip flexion bilaterally.   She reports numbness to both hands/plantar surface of feet Patient is hypo-reflexic in upper/lower extremities EXTREMITIES: pulses normal/equal, full ROM SKIN: warm, color normal PSYCH: no abnormalities of mood noted, alert and oriented to situation   ED Treatments / Results  DIAGNOSTIC STUDIES:  Oxygen Saturation is 100% on RA, normal by my interpretation.    COORDINATION OF CARE:  11:23 PM Will order urinalysis. Discussed treatment plan with pt at bedside and pt agreed to plan.   Labs (all labs ordered are listed, but only abnormal results are displayed) Labs Reviewed  BASIC METABOLIC PANEL - Abnormal; Notable for the following:       Result Value   Glucose, Bld 129 (*)    Creatinine, Ser 1.65 (*)    GFR calc non Af Amer 32 (*)    GFR calc Af Amer 37 (*)    All other components within normal limits  CBC - Abnormal; Notable for the following:    RBC 5.26 (*)    All other components within normal limits  URINALYSIS, ROUTINE W REFLEX MICROSCOPIC - Abnormal; Notable for the following:    Color, Urine STRAW (*)    pH 9.0 (*)    Ketones, ur 5 (*)    All other components within normal limits  CK    EKG  EKG Interpretation  Date/Time:  Sunday January 11 2017 22:22:42 EST Ventricular Rate:  68 PR Interval:    QRS Duration: 90 QT Interval:  405 QTC Calculation: 431 R Axis:   76 Text Interpretation:  Sinus rhythm Anteroseptal infarct, age indeterminate Baseline wander in lead(s) V3 Confirmed by Aniesha Haughn  MD, Nilsa Macht ([id number]) on 01/11/2017 11:07:59 PM       Radiology No results found.  Procedures Procedures (including critical care time)  Medications Ordered in ED Medications  sodium chloride 0.9 % bolus 1,000 mL (0 mLs Intravenous Stopped 01/12/17 0140)     Initial Impression / Assessment and Plan / ED Course  I have reviewed the triage vital signs and the nursing notes.  Pertinent  labs  results that were available during my care of the patient were reviewed by me and considered in my medical decision making (see chart for details).     12 :47 AM D/w dr [facility]  for possible GBS (guillian barre syndrome) with recent infection now with diffuse weakness He recommends admit, hydration and possible LP Pt has no focal signs of acute stroke No new back pain and no urinary retention to suggest cauda equina 1:44 AM D/w dr Blaine Hamper who saw patient for hospitalist admission Pt decided to go home and see her PCP later today at 10am I advised need for inpatient admission/evaluation and neurology consult Pt insists on going home She is awake/alert, and able to make her own decisions Her husband will take her home and take her to PCP later today I advised her she can return at anytime if her symptoms should worsen  Final Clinical Impressions(s) / ED Diagnoses   Final diagnoses:  Weakness    New Prescriptions New Prescriptions   No medications on file  I personally performed the services described in this documentation, which was scribed in my presence. The recorded information has been reviewed and is accurate.        Joanne Fraise, MD 01/12/17 [phone]

## 2017-01-11 NOTE — ED Triage Notes (Signed)
Per EMS, pt complains of generalized weakness for the past few days and leg cramping since last night. Pt was diagnosed with pneumonia on Tuesday, tested negative for flu on Friday.

## 2017-01-11 NOTE — ED Notes (Signed)
Pt has bilateral leg cramping that began yesterday. Pt was recently diagnosed with pneumonia as well as strep throat- both of which she has been receiving treatment. Pt only reports leg cramping when she tries to stand. Pt denies n/v/d

## 2017-01-12 ENCOUNTER — Emergency Department (HOSPITAL_COMMUNITY): Payer: Medicare Other

## 2017-01-12 ENCOUNTER — Encounter (HOSPITAL_COMMUNITY): Payer: Self-pay | Admitting: Neurology

## 2017-01-12 ENCOUNTER — Inpatient Hospital Stay (HOSPITAL_COMMUNITY)
Admission: EM | Admit: 2017-01-12 | Discharge: 2017-02-04 | DRG: 004 | Disposition: A | Discharge status: Rehabilitation Facility | Payer: Medicare Other | Attending: Internal Medicine | Admitting: Internal Medicine

## 2017-01-12 DIAGNOSIS — G63 Polyneuropathy in diseases classified elsewhere: Secondary | ICD-10-CM | POA: Diagnosis not present

## 2017-01-12 DIAGNOSIS — R9389 Abnormal findings on diagnostic imaging of other specified body structures: Secondary | ICD-10-CM

## 2017-01-12 DIAGNOSIS — B999 Unspecified infectious disease: Secondary | ICD-10-CM | POA: Diagnosis present

## 2017-01-12 DIAGNOSIS — F32A Depression, unspecified: Secondary | ICD-10-CM | POA: Diagnosis present

## 2017-01-12 DIAGNOSIS — G8250 Quadriplegia, unspecified: Secondary | ICD-10-CM | POA: Diagnosis not present

## 2017-01-12 DIAGNOSIS — W19XXXA Unspecified fall, initial encounter: Secondary | ICD-10-CM | POA: Diagnosis present

## 2017-01-12 DIAGNOSIS — Z7982 Long term (current) use of aspirin: Secondary | ICD-10-CM | POA: Diagnosis not present

## 2017-01-12 DIAGNOSIS — Z90710 Acquired absence of both cervix and uterus: Secondary | ICD-10-CM

## 2017-01-12 DIAGNOSIS — I959 Hypotension, unspecified: Secondary | ICD-10-CM | POA: Diagnosis not present

## 2017-01-12 DIAGNOSIS — Z79899 Other long term (current) drug therapy: Secondary | ICD-10-CM

## 2017-01-12 DIAGNOSIS — H189 Unspecified disorder of cornea: Secondary | ICD-10-CM | POA: Diagnosis not present

## 2017-01-12 DIAGNOSIS — R739 Hyperglycemia, unspecified: Secondary | ICD-10-CM | POA: Diagnosis not present

## 2017-01-12 DIAGNOSIS — R202 Paresthesia of skin: Secondary | ICD-10-CM

## 2017-01-12 DIAGNOSIS — G8929 Other chronic pain: Secondary | ICD-10-CM | POA: Diagnosis present

## 2017-01-12 DIAGNOSIS — Z6827 Body mass index (BMI) 27.0-27.9, adult: Secondary | ICD-10-CM

## 2017-01-12 DIAGNOSIS — R531 Weakness: Secondary | ICD-10-CM | POA: Diagnosis not present

## 2017-01-12 DIAGNOSIS — H02206 Unspecified lagophthalmos left eye, unspecified eyelid: Secondary | ICD-10-CM | POA: Diagnosis not present

## 2017-01-12 DIAGNOSIS — I480 Paroxysmal atrial fibrillation: Secondary | ICD-10-CM | POA: Diagnosis not present

## 2017-01-12 DIAGNOSIS — M5440 Lumbago with sciatica, unspecified side: Secondary | ICD-10-CM | POA: Diagnosis not present

## 2017-01-12 DIAGNOSIS — S0501XA Injury of conjunctiva and corneal abrasion without foreign body, right eye, initial encounter: Secondary | ICD-10-CM | POA: Diagnosis not present

## 2017-01-12 DIAGNOSIS — R001 Bradycardia, unspecified: Secondary | ICD-10-CM

## 2017-01-12 DIAGNOSIS — S0502XA Injury of conjunctiva and corneal abrasion without foreign body, left eye, initial encounter: Secondary | ICD-10-CM | POA: Diagnosis not present

## 2017-01-12 DIAGNOSIS — Z430 Encounter for attention to tracheostomy: Secondary | ICD-10-CM

## 2017-01-12 DIAGNOSIS — I4892 Unspecified atrial flutter: Secondary | ICD-10-CM | POA: Diagnosis not present

## 2017-01-12 DIAGNOSIS — J690 Pneumonitis due to inhalation of food and vomit: Secondary | ICD-10-CM | POA: Diagnosis not present

## 2017-01-12 DIAGNOSIS — G610 Guillain-Barre syndrome: Secondary | ICD-10-CM | POA: Diagnosis present

## 2017-01-12 DIAGNOSIS — Z4659 Encounter for fitting and adjustment of other gastrointestinal appliance and device: Secondary | ICD-10-CM

## 2017-01-12 DIAGNOSIS — J9600 Acute respiratory failure, unspecified whether with hypoxia or hypercapnia: Secondary | ICD-10-CM

## 2017-01-12 DIAGNOSIS — E861 Hypovolemia: Secondary | ICD-10-CM | POA: Diagnosis present

## 2017-01-12 DIAGNOSIS — M6281 Muscle weakness (generalized): Secondary | ICD-10-CM | POA: Diagnosis not present

## 2017-01-12 DIAGNOSIS — R29898 Other symptoms and signs involving the musculoskeletal system: Secondary | ICD-10-CM

## 2017-01-12 DIAGNOSIS — Z8249 Family history of ischemic heart disease and other diseases of the circulatory system: Secondary | ICD-10-CM

## 2017-01-12 DIAGNOSIS — R200 Anesthesia of skin: Secondary | ICD-10-CM

## 2017-01-12 DIAGNOSIS — Z801 Family history of malignant neoplasm of trachea, bronchus and lung: Secondary | ICD-10-CM

## 2017-01-12 DIAGNOSIS — B370 Candidal stomatitis: Secondary | ICD-10-CM | POA: Diagnosis present

## 2017-01-12 DIAGNOSIS — M545 Low back pain: Secondary | ICD-10-CM | POA: Diagnosis present

## 2017-01-12 DIAGNOSIS — E876 Hypokalemia: Secondary | ICD-10-CM | POA: Diagnosis not present

## 2017-01-12 DIAGNOSIS — K5900 Constipation, unspecified: Secondary | ICD-10-CM | POA: Diagnosis present

## 2017-01-12 DIAGNOSIS — E860 Dehydration: Secondary | ICD-10-CM | POA: Diagnosis present

## 2017-01-12 DIAGNOSIS — R1314 Dysphagia, pharyngoesophageal phase: Secondary | ICD-10-CM | POA: Diagnosis present

## 2017-01-12 DIAGNOSIS — E870 Hyperosmolality and hypernatremia: Secondary | ICD-10-CM | POA: Diagnosis not present

## 2017-01-12 DIAGNOSIS — Z9289 Personal history of other medical treatment: Secondary | ICD-10-CM

## 2017-01-12 DIAGNOSIS — H02203 Unspecified lagophthalmos right eye, unspecified eyelid: Secondary | ICD-10-CM | POA: Diagnosis not present

## 2017-01-12 DIAGNOSIS — F418 Other specified anxiety disorders: Secondary | ICD-10-CM | POA: Diagnosis present

## 2017-01-12 DIAGNOSIS — R1310 Dysphagia, unspecified: Secondary | ICD-10-CM

## 2017-01-12 DIAGNOSIS — J9690 Respiratory failure, unspecified, unspecified whether with hypoxia or hypercapnia: Secondary | ICD-10-CM

## 2017-01-12 DIAGNOSIS — D6489 Other specified anemias: Secondary | ICD-10-CM | POA: Diagnosis present

## 2017-01-12 DIAGNOSIS — J189 Pneumonia, unspecified organism: Secondary | ICD-10-CM

## 2017-01-12 DIAGNOSIS — Z931 Gastrostomy status: Secondary | ICD-10-CM

## 2017-01-12 DIAGNOSIS — R0602 Shortness of breath: Secondary | ICD-10-CM

## 2017-01-12 DIAGNOSIS — E871 Hypo-osmolality and hyponatremia: Secondary | ICD-10-CM | POA: Diagnosis not present

## 2017-01-12 DIAGNOSIS — J9601 Acute respiratory failure with hypoxia: Secondary | ICD-10-CM | POA: Diagnosis not present

## 2017-01-12 DIAGNOSIS — E785 Hyperlipidemia, unspecified: Secondary | ICD-10-CM | POA: Diagnosis present

## 2017-01-12 DIAGNOSIS — L8990 Pressure ulcer of unspecified site, unspecified stage: Secondary | ICD-10-CM | POA: Insufficient documentation

## 2017-01-12 DIAGNOSIS — H02401 Unspecified ptosis of right eyelid: Secondary | ICD-10-CM | POA: Diagnosis not present

## 2017-01-12 DIAGNOSIS — R000 Tachycardia, unspecified: Secondary | ICD-10-CM | POA: Diagnosis present

## 2017-01-12 DIAGNOSIS — E8770 Fluid overload, unspecified: Secondary | ICD-10-CM | POA: Diagnosis not present

## 2017-01-12 DIAGNOSIS — R339 Retention of urine, unspecified: Secondary | ICD-10-CM | POA: Diagnosis present

## 2017-01-12 DIAGNOSIS — I4891 Unspecified atrial fibrillation: Secondary | ICD-10-CM

## 2017-01-12 DIAGNOSIS — F329 Major depressive disorder, single episode, unspecified: Secondary | ICD-10-CM | POA: Diagnosis not present

## 2017-01-12 DIAGNOSIS — I10 Essential (primary) hypertension: Secondary | ICD-10-CM | POA: Diagnosis present

## 2017-01-12 DIAGNOSIS — Z452 Encounter for adjustment and management of vascular access device: Secondary | ICD-10-CM

## 2017-01-12 LAB — CBC WITH DIFFERENTIAL/PLATELET
BASOS ABS: 0 10*3/uL (ref 0.0–0.1)
Basophils Relative: 0 %
EOS ABS: 0.1 10*3/uL (ref 0.0–0.7)
EOS PCT: 1 %
HCT: 42.9 % (ref 36.0–46.0)
Hemoglobin: 14.2 g/dL (ref 12.0–15.0)
Lymphocytes Relative: 36 %
Lymphs Abs: 4.1 10*3/uL — ABNORMAL HIGH (ref 0.7–4.0)
MCH: 26.6 pg (ref 26.0–34.0)
MCHC: 33.1 g/dL (ref 30.0–36.0)
MCV: 80.3 fL (ref 78.0–100.0)
MONO ABS: 0.7 10*3/uL (ref 0.1–1.0)
Monocytes Relative: 6 %
Neutro Abs: 6.6 10*3/uL (ref 1.7–7.7)
Neutrophils Relative %: 57 %
PLATELETS: 289 10*3/uL (ref 150–400)
RBC: 5.34 MIL/uL — AB (ref 3.87–5.11)
RDW: 13.6 % (ref 11.5–15.5)
WBC: 11.5 10*3/uL — AB (ref 4.0–10.5)

## 2017-01-12 LAB — COMPREHENSIVE METABOLIC PANEL
ALK PHOS: 46 U/L (ref 38–126)
ALT: 18 U/L (ref 14–54)
AST: 28 U/L (ref 15–41)
Albumin: 4 g/dL (ref 3.5–5.0)
Anion gap: 12 (ref 5–15)
BILIRUBIN TOTAL: 0.9 mg/dL (ref 0.3–1.2)
BUN: 11 mg/dL (ref 6–20)
CO2: 20 mmol/L — ABNORMAL LOW (ref 22–32)
Calcium: 9.1 mg/dL (ref 8.9–10.3)
Chloride: 107 mmol/L (ref 101–111)
Creatinine, Ser: 1.14 mg/dL — ABNORMAL HIGH (ref 0.44–1.00)
GFR calc Af Amer: 58 mL/min — ABNORMAL LOW (ref >60)
GFR, EST NON AFRICAN AMERICAN: 50 mL/min — AB (ref >60)
GLUCOSE: 96 mg/dL (ref 65–99)
Potassium: 2.8 mmol/L — ABNORMAL LOW (ref 3.5–5.1)
Sodium: 139 mmol/L (ref 135–145)
TOTAL PROTEIN: 6.7 g/dL (ref 6.5–8.1)

## 2017-01-12 LAB — I-STAT CG4 LACTIC ACID, ED: LACTIC ACID, VENOUS: 2.22 mmol/L — AB (ref 0.5–1.9)

## 2017-01-12 LAB — TSH: TSH: 1.645 u[IU]/mL (ref 0.350–4.500)

## 2017-01-12 LAB — CK: Total CK: 280 U/L — ABNORMAL HIGH (ref 38–234)

## 2017-01-12 LAB — VITAMIN B12: Vitamin B-12: 726 pg/mL (ref 180–914)

## 2017-01-12 MED ORDER — SODIUM CHLORIDE 0.9 % IV SOLN
30.0000 meq | Freq: Once | INTRAVENOUS | Status: AC
  Administered 2017-01-12: 30 meq via INTRAVENOUS
  Filled 2017-01-12: qty 15

## 2017-01-12 MED ORDER — SODIUM CHLORIDE 0.9% FLUSH
3.0000 mL | Freq: Two times a day (BID) | INTRAVENOUS | Status: DC
  Administered 2017-01-13 – 2017-01-29 (×28): 3 mL via INTRAVENOUS

## 2017-01-12 MED ORDER — MORPHINE SULFATE (PF) 4 MG/ML IV SOLN
4.0000 mg | Freq: Once | INTRAVENOUS | Status: AC
  Administered 2017-01-12: 4 mg via INTRAVENOUS
  Filled 2017-01-12: qty 1

## 2017-01-12 MED ORDER — ALBUTEROL SULFATE (2.5 MG/3ML) 0.083% IN NEBU: 2.5000 mg | INHALATION_SOLUTION | RESPIRATORY_TRACT | Status: DC | PRN

## 2017-01-12 MED ORDER — SODIUM CHLORIDE 0.9 % IV SOLN
INTRAVENOUS | Status: DC
  Administered 2017-01-12: 22:00:00 via INTRAVENOUS

## 2017-01-12 MED ORDER — LORAZEPAM 2 MG/ML IJ SOLN
0.5000 mg | Freq: Once | INTRAMUSCULAR | Status: AC | PRN
  Administered 2017-01-12: 0.5 mg via INTRAVENOUS
  Filled 2017-01-12: qty 1

## 2017-01-12 MED ORDER — SODIUM CHLORIDE 0.9 % IV BOLUS (SEPSIS): 1000.0000 mL | Freq: Once | INTRAVENOUS | Status: DC

## 2017-01-12 MED ORDER — BUPROPION HCL ER (XL) 150 MG PO TB24
150.0000 mg | ORAL_TABLET | Freq: Every morning | ORAL | Status: DC
  Administered 2017-01-13 – 2017-01-15 (×3): 150 mg via ORAL
  Filled 2017-01-12 (×3): qty 1

## 2017-01-12 MED ORDER — ONDANSETRON HCL 4 MG/2ML IJ SOLN: 4.0000 mg | Freq: Four times a day (QID) | INTRAMUSCULAR | Status: DC | PRN

## 2017-01-12 MED ORDER — ACETAMINOPHEN 325 MG PO TABS
650.0000 mg | ORAL_TABLET | Freq: Four times a day (QID) | ORAL | Status: DC | PRN
Start: 2017-01-12 — End: 2017-01-15

## 2017-01-12 MED ORDER — DIAZEPAM 5 MG PO TABS
10.0000 mg | ORAL_TABLET | Freq: Two times a day (BID) | ORAL | Status: DC | PRN
  Administered 2017-01-12 – 2017-01-13 (×3): 10 mg via ORAL
  Filled 2017-01-12 (×3): qty 2

## 2017-01-12 MED ORDER — ONDANSETRON HCL 4 MG PO TABS: 4.0000 mg | ORAL_TABLET | Freq: Four times a day (QID) | ORAL | Status: DC | PRN

## 2017-01-12 MED ORDER — POTASSIUM CHLORIDE CRYS ER 20 MEQ PO TBCR
40.0000 meq | EXTENDED_RELEASE_TABLET | Freq: Once | ORAL | Status: AC
  Administered 2017-01-12: 40 meq via ORAL
  Filled 2017-01-12: qty 2

## 2017-01-12 MED ORDER — OMEGA-3-ACID ETHYL ESTERS 1 G PO CAPS
1.0000 g | ORAL_CAPSULE | Freq: Every day | ORAL | Status: DC
  Administered 2017-01-13 – 2017-01-15 (×3): 1 g via ORAL
  Filled 2017-01-12 (×3): qty 1

## 2017-01-12 MED ORDER — GADOBENATE DIMEGLUMINE 529 MG/ML IV SOLN
15.0000 mL | Freq: Once | INTRAVENOUS | Status: AC
  Administered 2017-01-12: 15 mL via INTRAVENOUS

## 2017-01-12 MED ORDER — ONE-DAILY MULTI VITAMINS PO TABS: 1.0000 | ORAL_TABLET | Freq: Every day | ORAL | Status: DC

## 2017-01-12 MED ORDER — PROBIOTIC DAILY PO CAPS: 1.0000 | ORAL_CAPSULE | Freq: Every day | ORAL | Status: DC

## 2017-01-12 MED ORDER — MAGNESIUM 250 MG PO TABS: 850.0000 mg | ORAL_TABLET | Freq: Every morning | ORAL | Status: DC

## 2017-01-12 MED ORDER — ACETAMINOPHEN 650 MG RE SUPP
650.0000 mg | Freq: Four times a day (QID) | RECTAL | Status: DC | PRN
  Filled 2017-01-12: qty 1

## 2017-01-12 MED ORDER — POTASSIUM CHLORIDE CRYS ER 20 MEQ PO TBCR: 40.0000 meq | EXTENDED_RELEASE_TABLET | Freq: Once | ORAL | Status: DC

## 2017-01-12 MED ORDER — VITAMIN D3 25 MCG (1000 UNIT) PO TABS: 2000.0000 [IU] | ORAL_TABLET | Freq: Every day | ORAL | Status: DC

## 2017-01-12 MED ORDER — VITAMIN C 500 MG PO TABS
1000.0000 mg | ORAL_TABLET | Freq: Every day | ORAL | Status: DC
  Administered 2017-01-13 – 2017-01-15 (×3): 1000 mg via ORAL
  Filled 2017-01-12 (×3): qty 2

## 2017-01-12 MED ORDER — HYDROCODONE-ACETAMINOPHEN 5-325 MG PO TABS
1.0000 | ORAL_TABLET | Freq: Four times a day (QID) | ORAL | Status: DC | PRN
  Administered 2017-01-12 – 2017-01-14 (×3): 1 via ORAL
  Filled 2017-01-12 (×3): qty 1

## 2017-01-12 MED ORDER — ASPIRIN EC 325 MG PO TBEC
325.0000 mg | DELAYED_RELEASE_TABLET | Freq: Every day | ORAL | Status: DC
  Administered 2017-01-13 – 2017-01-15 (×3): 325 mg via ORAL
  Filled 2017-01-12 (×3): qty 1

## 2017-01-12 MED ORDER — MORPHINE SULFATE (PF) 4 MG/ML IV SOLN
4.0000 mg | Freq: Once | INTRAVENOUS | Status: AC
Start: 2017-01-12 — End: 2017-01-12
  Administered 2017-01-12: 4 mg via INTRAVENOUS
  Filled 2017-01-12: qty 1

## 2017-01-12 MED ORDER — SODIUM CHLORIDE 0.9 % IV SOLN
Freq: Once | INTRAVENOUS | Status: AC
  Administered 2017-01-12: 10:00:00 via INTRAVENOUS

## 2017-01-12 MED ORDER — ZOLPIDEM TARTRATE 5 MG PO TABS
5.0000 mg | ORAL_TABLET | Freq: Every day | ORAL | Status: DC
  Administered 2017-01-13 – 2017-01-14 (×2): 5 mg via ORAL
  Filled 2017-01-12 (×3): qty 1

## 2017-01-12 NOTE — ED Triage Notes (Addendum)
Pt reports d/c from [facility] a few hours ago, they wanted her to stay due to workup for possible Rosalee Kaufman, but she had a f/u appointment scheduled with her PCP for f/u PNA that she didn't want to admit. Reports the sx of muscle weakness started sat night. Feels weak on right and left side, but left is weaker. Has rx for hydrocodone at home and has taken 5, 5-325 hydrocodones in the last 4 hours for muscle pain. Pt is a x 4, no respiratory difficulty.

## 2017-01-12 NOTE — ED Notes (Signed)
Patient d/c'd self care.  F/U reviewed.  Patient verbalized understanding. 

## 2017-01-12 NOTE — ED Provider Notes (Signed)
[facility] DEPT Provider Note   CSN: [id number] Arrival date & time: 01/12/17  [id number]     History   Chief Complaint Chief Complaint  Patient presents with  . Weakness    HPI Joanne Morrison is a 66 y.o. female.  HPI   Joanne Morrison is a 66 y.o. female, with a history of chronic low back pain and anxiety, presenting to the ED with progressive weakness and extremity pain.   Saturday night (1/27) her legs became weak and would not support her. She began to have numbness and tingling in her fingers and toes last night. Pt also has developed progressively worsening, burning pain through her entire body, which started in her extremities, but is now generalized. She endorses incontinence of bowel and bladder, worsening for the last three days. She states, "I go to the bathroom in my pants and don't know it."   Diagnosed with strep throat on 1/20. Then was diagnosed with pneumonia on 1/23 at her PCP office and treated with Rocephin IM, Azithromycin, prednisone, and albuterol.  Patient was seen a few hours ago at [facility] for this same issue. There, it was recommended that the patient be transferred to [facility], admitted, have neurology assessment, and continued workup for possible Guillain-Barr. Patient declined and opted to follow-up with her PCP. She returned due to worsening symptoms.  Denies shortness of breath, difficulty swallowing, N/V, rashes, or any other complaints.   Has not taken her daily medications today.      Past Medical History:  Diagnosis Date  . Anxiety   . Arthritis    back  . Chronic cystitis   . Chronic low back pain   . Depression   . Diverticulosis of colon   . Hemorrhoids   . History of colon polyps    09-24-2009  rectal hyperplastic polyp/   and 2016 non-cancerous  . History of endometriosis   . History of panic attacks   . Hyperlipidemia   . Osteopenia   . Pelvic pain   . Sensation of pressure in bladder area   . Wears glasses     Patient  Active Problem List   Diagnosis Date Noted  . Endometriosis 12/05/2013  . Chronic LBP 08/19/2012  . HYPERLIPIDEMIA 12/10/2010  . COLONIC POLYPS, HX OF 12/13/2009  . HYPERGLYCEMIA, FASTING 11/22/2008  . DEPRESSIVE DISORDER 02/07/2008  . ANOSMIA 02/07/2008  . HEADACHE, CHRONIC 02/07/2008  . MITRAL VALVE PROLAPSE 10/05/2007  . OSTEOPENIA 10/05/2007    Past Surgical History:  Procedure Laterality Date  . BUNIONECTOMY Left 2004 approx  . CARPAL TUNNEL RELEASE Bilateral right 2014;  left 2007  . COLONOSCOPY W/ POLYPECTOMY  09/24/2009  . CYSTO WITH HYDRODISTENSION [name] 10/15/2016   Procedure: CYSTOSCOPY/HYDRODISTENSION AND MARCAINE INSTILLATION;  Surgeon: Rana Snare, MD;  Location: [facility];  Service: Urology;  Laterality: [name];  . CYSTO/  HYDRODISTENTION/  INSTILLATION THERAPY  1997 approx  . HYSTEROSCOPY W/D&C  11/01/2004   POLYPECTOMY  . LAPAROSCOPIC VAGINAL HYSTERECTOMY WITH SALPINGO OOPHORECTOMY Bilateral 04/20/2012  . TUBAL LIGATION  yrs ago    OB History    Gravida Para Term Preterm AB Living   1 1 1     1    SAB TAB Ectopic Multiple Live Births           1       Home Medications    Prior to Admission medications   Medication Sig Start Date End Date Taking? Authorizing Provider  Alum Hydroxide-Mag Carbonate (GAVISCON PO) Take by  mouth as needed.    Historical Provider, MD  Ascorbic Acid (VITAMIN C) 1000 MG tablet Take 1,000 mg by mouth daily.    Historical Provider, MD  aspirin EC 325 MG tablet Take 325 mg by mouth daily.    Historical Provider, MD  buPROPion (WELLBUTRIN XL) 150 MG 24 hr tablet Take 150 mg by mouth every morning.    Historical Provider, MD  Cholecalciferol (VITAMIN D3) 2000 units TABS Take 2 capsules by mouth daily.    Historical Provider, MD  diazepam (VALIUM) 10 MG tablet Take 10 mg by mouth every 12 (twelve) hours as needed for anxiety.    Historical Provider, MD  Fish Oil-Cholecalciferol (OMEGA-3 FISH OIL-VITAMIN D3) 1200-1000  MG-UNIT CAPS Take 1 capsule by mouth daily.    Historical Provider, MD  HYDROcodone-acetaminophen (NORCO/VICODIN) 5-325 MG tablet Take 1 tablet by mouth every 6 (six) hours as needed for moderate pain.    Historical Provider, MD  HYDROcodone-acetaminophen (NORCO/VICODIN) 5-325 MG tablet Take 1-2 tablets by mouth every 6 (six) hours as needed. 10/15/16   Rana Snare, MD  Magnesium 250 MG TABS Take 850 mg by mouth every morning.    Historical Provider, MD  Meth-Hyo-M Bl-Na Phos-Ph Sal (USTELL) 120 MG CAPS Take 1 capsule (120 mg total) by mouth 3 (three) times daily as needed. 10/15/16   Rana Snare, MD  Multiple Vitamin (MULTIVITAMIN) tablet Take 1 tablet by mouth daily.    Historical Provider, MD  Potassium 95 MG TABS Take 1 tablet by mouth daily.    Historical Provider, MD  Probiotic Product (PROBIOTIC DAILY) CAPS Take 1 capsule by mouth daily.    Historical Provider, MD  rizatriptan (MAXALT) 10 MG tablet Take 1 tablet (10 mg total) by mouth as needed. May repeat in 2 hours if needed 12/05/13   Hendricks Limes, MD  zolpidem (AMBIEN) 10 MG tablet Take 10 mg by mouth at bedtime.    Historical Provider, MD    Family History Family History  Problem Relation Age of Onset  . Hypertension Mother   . Dementia Mother   . Lung cancer Mother     smoker  . Osteoporosis Mother   . Heart attack Father 19  . Hypertension Brother   . Diabetes Maternal Aunt   . Cancer Maternal Uncle     RENAL  . Heart attack Maternal Grandmother 86  . Lung cancer Maternal Grandfather     Ecologist  . Pulmonary embolism Brother     ? etiology  . Anesthesia problems Neg Hx   . Stroke Neg Hx     Social History Social History  Substance Use Topics  . Smoking status: Never Smoker  . Smokeless tobacco: Never Used  . Alcohol use No     Allergies   Sumatriptan and Olanzapine   Review of Systems Review of Systems  Constitutional: Negative for chills and fever.  HENT: Negative for trouble swallowing.     Eyes: Negative for visual disturbance.  Respiratory: Negative for cough and shortness of breath.   Cardiovascular: Negative for chest pain.  Gastrointestinal: Negative for abdominal pain, nausea and vomiting.       Bowel incontinence  Genitourinary:       Urinary incontinence  Musculoskeletal: Positive for arthralgias.  Skin: Negative for rash.  Neurological: Positive for weakness. Negative for dizziness, syncope, light-headedness, numbness and headaches.  All other systems reviewed and are negative.    Physical Exam Updated Vital Signs BP 175/99 (BP Location: Right Arm)   Pulse  69   Resp 16   Ht 5\' 2"  (1.575 m)   Wt 69.4 kg   SpO2 100%   BMI 27.98 kg/m   Physical Exam  Constitutional: She is oriented to person, place, and time. She appears well-developed and well-nourished. No distress.  HENT:  Head: Normocephalic and atraumatic.  Mouth/Throat: Mucous membranes are dry.  Eyes: Conjunctivae are normal.  Neck: Neck supple.  Cardiovascular: Normal rate, regular rhythm, normal heart sounds and intact distal pulses.   Pulmonary/Chest: Effort normal and breath sounds normal. No respiratory distress.  Abdominal: Soft. There is no tenderness. There is no guarding.  Musculoskeletal: She exhibits tenderness. She exhibits no edema.  Generalized tenderness in the large joints bilaterally.  Lymphadenopathy:    She has no cervical adenopathy.  Neurological: She is alert and oriented to person, place, and time.  Reflex Scores:      Patellar reflexes are 1+ on the right side and 3+ on the left side. Patient has marked weakness to the bilateral upper and lower extremities, weaker on the left. Strength in the lower extremities at the hip is 4/5 bilaterally, worse on the left.   Upper extremities: Patient has a lack of coordination in the upper extremities. She is inaccurate with finger to nose testing. She can not hold her arms out straight for no more than about 30 sec. Before they  begin to drop. Left arm begins to drop almost immediately, but is slow. She has tremoring in the muscles due to effort of holding arms up.  No numbness noted in upper or lower extremities. Pt endorses parasthesias in the fingers and toes.  Cranial nerves III-XII grossly intact. No noted facial droop.  Skin: Skin is warm and dry. She is not diaphoretic.  Psychiatric: She has a normal mood and affect. Her behavior is normal.  Nursing note and vitals reviewed.    ED Treatments / Results  Labs (all labs ordered are listed, but only abnormal results are displayed) Labs Reviewed  COMPREHENSIVE METABOLIC PANEL - Abnormal; Notable for the following:       Result Value   Potassium 2.8 (*)    CO2 20 (*)    Creatinine, Ser 1.14 (*)    GFR calc non Af Amer 50 (*)    GFR calc Af Amer 58 (*)    All other components within normal limits  CBC WITH DIFFERENTIAL/PLATELET - Abnormal; Notable for the following:    WBC 11.5 (*)    RBC 5.34 (*)    Lymphs Abs 4.1 (*)    All other components within normal limits  I-STAT CG4 LACTIC ACID, ED - Abnormal; Notable for the following:    Lactic Acid, Venous 2.22 (*)    All other components within normal limits  CULTURE, BLOOD (ROUTINE X 2)  CULTURE, BLOOD (ROUTINE X 2)    EKG  EKG Interpretation None       Radiology No results found.  Procedures Procedures (including critical care time)  Medications Ordered in ED Medications  potassium chloride SA (K-DUR,KLOR-CON) CR tablet 40 mEq (not administered)  LORazepam (ATIVAN) injection 0.5 mg (not administered)  morphine 4 MG/ML injection 4 mg (not administered)  0.9 %  sodium chloride infusion ( Intravenous New Bag/Given 01/12/17 1000)  LORazepam (ATIVAN) injection 0.5 mg (0.5 mg Intravenous Given 01/12/17 1222)  morphine 4 MG/ML injection 4 mg (4 mg Intravenous Given 01/12/17 1111)  gadobenate dimeglumine (MULTIHANCE) injection 15 mL (15 mLs Intravenous Contrast Given 01/12/17 1500)     Initial  Impression / Assessment and Plan / ED Course  I have reviewed the triage vital signs and the nursing notes.  Pertinent labs & imaging results that were available during my care of the patient were reviewed by me and considered in my medical decision making (see chart for details).      Patient presents with extremity pain and weakness, progressive over the last 2-3 days. Neurology workup and supportive care. Hypokalemia addressed.  10:45 AM Spoke with Dr. Leonel Ramsay, neurologist, who agreed to come evaluate the patient. Requested MRI of Cervical and thoracic spine. Pt insisted on "something for pain" prior to going to MRI. 11:34 AM PA Tamala Julian with Neurology assessed the patient. Requests CT of head, cervical, thoracic, and lumbar spine prior to MRI. Possible LP if MRI negative. Dr. Leonel Ramsay, neurologist, also evaluated this patient.   Findings and plan of care discussed with Lajean Saver, MD. Dr. Ashok Cordia personally evaluated and examined this patient.  End of shift patient care handoff report given to Chrissie Noa Dorothea Ogle) [place], PA-C. Plan: After MRI results return, reconsult with neurologist for next steps. In the mean time, control patient's pain as well as possible. For discharge, patient must be able to pass oral fluid challenge and ambulate.    Patient's workup at [facility] included CBC, BMP, CK, and urinalysis as well as an EKG. No acute abnormalities on CBC UA with pH of 9.0 and ketones of 5 BMP with a creatinine of 1.65 CK normal   Vitals:   01/12/17 0830 01/12/17 0900 01/12/17 0930 01/12/17 0952  BP: 173/96 178/90 (!) 147/103 155/68  Pulse: (!) 53 (!) 57 63 68  Resp: 15   16  Temp:    (!) 96 F (35.6 C)  TempSrc:    Rectal  SpO2: 100% 100% 100% 100%  Weight:      Height:       Vitals:   01/12/17 0930 01/12/17 0952 01/12/17 1000 01/12/17 1130  BP: (!) 147/103 155/68 174/81 146/82  Pulse: 63 68 63 63  Resp:  16  15  Temp:  (!) 96 F (35.6 C)    TempSrc:  Rectal     SpO2: 100% 100% 100% 98%  Weight:      Height:         Final Clinical Impressions(s) / ED Diagnoses   Final diagnoses:  Lower extremity weakness  Numbness and tingling  Weakness    New Prescriptions New Prescriptions   No medications on file     Layla Maw 01/12/17 [place], MD 01/13/17 [phone]

## 2017-01-12 NOTE — Consult Note (Addendum)
NEURO HOSPITALIST CONSULT NOTE   Requestig physician: Dr. Ashok Cordia   Reason for Consult:Lower extremity decreased sensation and slight weakness   History obtained from:  Patient     HPI:                                                                                                                                          Joanne Morrison is an 66 y.o. female presents to the emergency department after 3 days history of weakness or lower extremities. Patient's history is slightly convoluted and changes as more information is given. Initially patient states that on Saturday she was cleaning her house and all of a sudden she felt stabbing pain in her quadriceps bilaterally and fell to the ground. At that time she was unable to get up and had to crawl on the ground. She stated at that time this was the first time this ever happened. Patient went [facility] and then was transferred to [facility].   Looking back in the records patient does have lumbar spondylosis with low back pain for 15 years with severe L4-5 facet arthrosis in great 1/2 anterolisthesis moderate spinal stenosis and moderate right neural foraminal stenosis..    After going back into the room in speaking to the patient patient states actually this has been an issue this been going on for a few weeks. She's been having some numbness and tingling in her legs along with some weakness has been coming and going. She's noticed that she's been dribbling with her urine and having some green discharge from her anus that has been staining her underwear. She says that she has been pretty constipated and not having bowel movements and thus cannot tell me if she is able to hold a bowel movement. She denies any dermatomal distribution numbness or tingling.  Past Medical History:  Diagnosis Date  . Anxiety   . Arthritis    back  . Chronic cystitis   . Chronic low back pain   . Depression   . Diverticulosis of colon    . Hemorrhoids   . History of colon polyps    09-24-2009  rectal hyperplastic polyp/   and 2016 non-cancerous  . History of endometriosis   . History of panic attacks   . Hyperlipidemia   . Osteopenia   . Pelvic pain   . Sensation of pressure in bladder area   . Wears glasses     Past Surgical History:  Procedure Laterality Date  . BUNIONECTOMY Left 2004 approx  . CARPAL TUNNEL RELEASE Bilateral right 2014;  left 2007  . COLONOSCOPY W/ POLYPECTOMY  09/24/2009  . CYSTO WITH HYDRODISTENSION [name] 10/15/2016   Procedure: CYSTOSCOPY/HYDRODISTENSION AND MARCAINE INSTILLATION;  Surgeon: Rana Snare, MD;  Location: [facility];  Service: Urology;  Laterality: [name];  . CYSTO/  HYDRODISTENTION/  INSTILLATION THERAPY  1997 approx  . HYSTEROSCOPY W/D&C  11/01/2004   POLYPECTOMY  . LAPAROSCOPIC VAGINAL HYSTERECTOMY WITH SALPINGO OOPHORECTOMY Bilateral 04/20/2012  . TUBAL LIGATION  yrs ago    Family History  Problem Relation Age of Onset  . Hypertension Mother   . Dementia Mother   . Lung cancer Mother     smoker  . Osteoporosis Mother   . Heart attack Father 30  . Hypertension Brother   . Diabetes Maternal Aunt   . Cancer Maternal Uncle     RENAL  . Heart attack Maternal Grandmother 86  . Lung cancer Maternal Grandfather     Ecologist  . Pulmonary embolism Brother     ? etiology  . Anesthesia problems Neg Hx   . Stroke Neg Hx      Social History:  reports that she has never smoked. She has never used smokeless tobacco. She reports that she does not drink alcohol or use drugs.  Allergies  Allergen Reactions  . Sumatriptan Other (See Comments)    REACTION: altered mental status  . Olanzapine Other (See Comments)    STIFF JOINTS, EXTREMITY WEAKNESS, MEMORY LOSS, LOSS OF BALANCE    MEDICATIONS:                                                                                                                     Current Facility-Administered Medications   Medication Dose Route Frequency Provider Last Rate Last Dose  . LORazepam (ATIVAN) injection 0.5 mg  0.5 mg Intravenous Once PRN Lorayne Bender, PA-C       Current Outpatient Prescriptions  Medication Sig Dispense Refill  . Alum Hydroxide-Mag Carbonate (GAVISCON PO) Take by mouth as needed.    . Ascorbic Acid (VITAMIN C) 1000 MG tablet Take 1,000 mg by mouth daily.    Marland Kitchen aspirin EC 325 MG tablet Take 325 mg by mouth daily.    Marland Kitchen buPROPion (WELLBUTRIN XL) 150 MG 24 hr tablet Take 150 mg by mouth every morning.    . Cholecalciferol (VITAMIN D3) 2000 units TABS Take 2 capsules by mouth daily.    . diazepam (VALIUM) 10 MG tablet Take 10 mg by mouth every 12 (twelve) hours as needed for anxiety.    . Fish Oil-Cholecalciferol (OMEGA-3 FISH OIL-VITAMIN D3) 1200-1000 MG-UNIT CAPS Take 1 capsule by mouth daily.    Marland Kitchen HYDROcodone-acetaminophen (NORCO/VICODIN) 5-325 MG tablet Take 1 tablet by mouth every 6 (six) hours as needed for moderate pain.    Marland Kitchen HYDROcodone-acetaminophen (NORCO/VICODIN) 5-325 MG tablet Take 1-2 tablets by mouth every 6 (six) hours as needed. 20 tablet 0  . Magnesium 250 MG TABS Take 850 mg by mouth every morning.    . Meth-Hyo-M Bl-Na Phos-Ph Sal (USTELL) 120 MG CAPS Take 1 capsule (120 mg total) by mouth 3 (three) times daily as needed. 60 capsule 0  . Multiple Vitamin (MULTIVITAMIN) tablet Take 1 tablet by mouth daily.    Marland Kitchen  Potassium 95 MG TABS Take 1 tablet by mouth daily.    . Probiotic Product (PROBIOTIC DAILY) CAPS Take 1 capsule by mouth daily.    . rizatriptan (MAXALT) 10 MG tablet Take 1 tablet (10 mg total) by mouth as needed. May repeat in 2 hours if needed 10 tablet 1  . zolpidem (AMBIEN) 10 MG tablet Take 10 mg by mouth at bedtime.        ROS:                                                                                                                                       History obtained from the patient  General ROS: negative for - chills, fatigue, fever, night  sweats, weight gain or weight loss Psychological ROS: negative for - behavioral disorder, hallucinations, memory difficulties, mood swings or suicidal ideation Ophthalmic ROS: negative for - blurry vision, double vision, eye pain or loss of vision ENT ROS: negative for - epistaxis, nasal discharge, oral lesions, sore throat, tinnitus or vertigo Allergy and Immunology ROS: negative for - hives or itchy/watery eyes Hematological and Lymphatic ROS: negative for - bleeding problems, bruising or swollen lymph nodes Endocrine ROS: negative for - galactorrhea, hair pattern changes, polydipsia/polyuria or temperature intolerance Respiratory ROS: negative for - cough, hemoptysis, shortness of breath or wheezing Cardiovascular ROS: negative for - chest pain, dyspnea on exertion, edema or irregular heartbeat Gastrointestinal ROS: negative for - abdominal pain, diarrhea, hematemesis, nausea/vomiting or stool incontinence Genito-Urinary ROS: negative for - dysuria, hematuria, incontinence or urinary frequency/urgency Musculoskeletal ROS: negative for - joint swelling or muscular weakness Neurological ROS: as noted in HPI Dermatological ROS: negative for rash and skin lesion changes   Blood pressure 174/81, pulse 63, temperature (!) 96 F (35.6 C), temperature source Rectal, resp. rate 16, height 5\' 2"  (1.575 m), weight 69.4 kg (153 lb), SpO2 100 %.   Neurologic Examination:                                                                                                      HEENT-  Normocephalic, no lesions, without obvious abnormality.  Normal external eye and conjunctiva.  Normal TM's bilaterally.  Normal auditory canals and external ears. Normal external nose, mucus membranes and septum.  Normal pharynx. Cardiovascular- S1, S2 normal, pulses palpable throughout   Lungs- chest clear, no wheezing, rales, normal symmetric air entry Abdomen- normal findings: bowel sounds normal Extremities- no  edema Lymph-no adenopathy palpable Musculoskeletal-no joint tenderness, deformity or swelling  Skin-warm and dry, no hyperpigmentation, vitiligo, or suspicious lesions  Neurological Examination Mental Status: Alert, oriented, thought content appropriate.  Speech fluent without evidence of aphasia.  Able to follow 3 step commands without difficulty. Cranial Nerves: II: Discs flat bilaterally; Visual fields grossly normal, pupils equal, round, reactive to light and accommodation III,IV, VI: ptosis not present, extra-ocular motions intact bilaterally V,VII: smile symmetric, facial light touch sensation normal bilaterally VIII: hearing normal bilaterally IX,X: uvula rises symmetrically XI: bilateral shoulder shrug XII: midline tongue extension Motor: Right : Upper extremity   5/5    Left:     Upper extremity   5/5  Lower extremity   5/5     Lower extremity   5/5 Tone and bulk:normal tone throughout; no atrophy noted Sensory: Pinprick and light touch intact throughout, bilaterally Deep Tendon Reflexes: 2+ and symmetric throughout bilateral upper extremities with no knee jerk or ankle jerk Plantars: Mute bilaterally Cerebellar: normal finger-to-nose,  and normal heel-to-shin test Gait: Not tested      Lab Results: Basic Metabolic Panel:  Recent Labs Lab 01/11/17 2226  NA 137  K 3.9  CL 105  CO2 23  GLUCOSE 129*  BUN 18  CREATININE 1.65*  CALCIUM 9.1    Liver Function Tests: No results for input(s): AST, ALT, ALKPHOS, BILITOT, PROT, ALBUMIN in the last 168 hours. No results for input(s): LIPASE, AMYLASE in the last 168 hours. No results for input(s): AMMONIA in the last 168 hours.  CBC:  Recent Labs Lab 01/11/17 2226  WBC 10.0  HGB 13.9  HCT 42.2  MCV 80.2  PLT 311    Cardiac Enzymes:  Recent Labs Lab 01/11/17 2226  CKTOTAL 123    Lipid Panel: No results for input(s): CHOL, TRIG, HDL, CHOLHDL, VLDL, LDLCALC in the last 168 hours.  CBG: No results  for input(s): GLUCAP in the last 168 hours.  Microbiology: Results for orders placed or performed in visit on 07/08/12  Urine Culture     Status: None   Collection Time: 07/08/12  4:52 PM  Result Value Ref Range Status   Colony Count NO GROWTH  Final   Organism ID, Bacteria NO GROWTH  Final    Coagulation Studies: No results for input(s): LABPROT, INR in the last 72 hours.  Imaging: No results found.     Assessment and plan per attending neurologist  Etta Quill PA-C [facility] [phone]  01/12/2017, 11:34 AM  She has mild 4+/5 on the right, 4-/5 on the left in  LE. She has a left knee reflex, but not right knee. No clear spinal level to sensation.   Assessment/Plan:  66 yo F with acute lower extremity weakness/numbness and back pain. I am concerned for cord compromise and we have requested MRI spine. If this is negative, she may need LP to assess for inflammation.   1) MRI C-spine, T-spine. May consider L-spine as well.  2) Will need brain imaging, either CT or MRI in case LP is needed.  3) further recommendations depending on imaging results.   Roland Rack, MD [facility] [phone]  If 7pm- 7am, please page neurology on call as listed in [place].

## 2017-01-12 NOTE — ED Notes (Addendum)
Rectal temperature 96.0.  Informed Shawn Joy, PA-C.

## 2017-01-12 NOTE — ED Notes (Signed)
Pt. Refusing to be transported to CT because she wants pain medication. EDP notified and at bedside. Pt. Understands this will delay CT scan.

## 2017-01-12 NOTE — Progress Notes (Addendum)
This is a no charge note   I was called for admission for Joanne Morrison by Dr Christy Gentles. Pt was recently diagnosed with strep throat, and treated with one course of Z-Pak. She continues to have cough, and therefore was treated with another course of Levaquin for pneumonia. At the same time, patient was also treated with prednisone and cough medication. Today she comes in due to bilateral leg weakness and cannot walk normally. Per Dr. 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 Neurology, dr. Nicole Kindred was consulted. When I interviewed her in ED, she seemed to be mildly confused. She was also anxious and could not focus on answering any of my questions.  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 I therefore turned to ask her husband for helping her to answer my questions, which made her and her husband unhappy with me. After the interview, I told her that we consulted neurology for recommendations, she laughed loudly, saying that " I do not have neurological problem, I just have nerve issues". I explained to her that the neurologist will consult on her nerve issues for possible GBS. She is still very mad. Later on, I was told by Dr. Christy Gentles that pt and her husband decided to leave the hospital due to unhappy experience. Due to concerns that pt may have steroid-induced mild psychosis and ineffectiveness of steroid, even potential delay of the recovery of GBS, I went to her room and gave my final recommendations, that is "please stop taking prednisone since it may hurt you" though she and her husband are still very unhyppy.   Ivor Costa, MD  [facility] Pager [phone]  If 7PM-7AM, please contact night-coverage [email or web address] Password [email or web address] 01/12/2017, 2:00 AM

## 2017-01-12 NOTE — ED Notes (Signed)
Pt Still in MRI 

## 2017-01-12 NOTE — ED Provider Notes (Signed)
Patient was received and care hand off by PA Joy. Please see the completed note for history of present illness, physical exam, workup. Patient returned from MRI. MRI results showed no significant abnormalities explain patient's current symptoms. Spoke with Dr. Leonel Ramsay with neurology. He states that patient would benefit from admission with likely physical therapy consult in a.m. Also feels the patient would benefit from LP to check protein levels for inflammation. Patient came to try at [facility] service. Patient continues to be unable to ambulate. She also complains of pain. Spoke with Dr. Sloan Leiter who will admit patient to hospital. MedSurg orders were placed. Dr. Cathleen Fears is aware of the need for an LP and states this could be done inpatient workup. The patient is hemodynamically stable this time. She has no nuchal rigidity. She is afebrile and not tachycardic. Low specific meningitis. Patient informed of findings and plan of care is agreeable to the above plan.   Doristine Devoid, PA-C 01/12/17 [place], MD 01/13/17 [phone]

## 2017-01-12 NOTE — ED Notes (Signed)
EDP at bedside  

## 2017-01-12 NOTE — ED Notes (Signed)
Pt request to wait for labs until she sees a doctor.

## 2017-01-12 NOTE — ED Notes (Signed)
Neurology at bedside.

## 2017-01-12 NOTE — ED Notes (Signed)
Patient transported to MRI 

## 2017-01-12 NOTE — ED Notes (Signed)
ED Provider at bedside. 

## 2017-01-12 NOTE — ED Notes (Signed)
Pt. Tearful and upset that no one has ordered her anything for pain. Pt. Yelling at RN stating that the IV isn't going to help her pain. EDP made aware. RN used breathing techniques to calm patient.

## 2017-01-12 NOTE — ED Notes (Addendum)
CT contacted about stat scans. They state they will get to her whenever they can.

## 2017-01-12 NOTE — H&P (Signed)
HISTORY AND PHYSICAL       PATIENT DETAILS Name: Joanne Morrison Age: 66 y.o. Sex: female Date of Birth: 1951-01-12 Admit Date: 01/12/2017 [id number] A, MD   Patient coming from: Home   CHIEF COMPLAINT:  Inability to walk, lower ext weakness (left>right), worsening myalgias, left upper ext weakness-approximately 3-5 days  HPI: Joanne Morrison is a 66 y.o. female with medical history significant of chronic low back pain, anxiety, depression, hypertension who presents to the hospital today for evaluation of multiple complaints as stated above. Apparently approximately a week or so ago, patient was diagnosed with strep throat and she completed a course of Zithromax. A few days back, she started having pain along with tingling and numbness in her bilateral thigh area, but this gradually progressed up and now involves the back, bilateral shoulder girdle areas. Patient claims that the pain is so intense that it is now difficult for her to even turn around in bed. She claims that she over the past few days has started to have predominantly left upper and left lower extremity weakness-but feels weak all over.  Interestingly, she was seen in [place] emergency room on 1/28-she was sent home, but within 4-5 hours of going home her symptoms worsened for her to come back to the emergency room earlier today.  Patient denies any recent fever-although had apparently cough/headaches last week and was diagnosed with strep throat for which she finished a course of Z-Pak and steroids.  ED Course:  In the emergency room, she was evaluated by neurology, and underwent MRI of the brain and entire spine-this was negative for any significant acute structural issues. Hospitalist service was asked to admit this patient for further inpatient evaluation. Neurology has already been consulted, plans are for lumbar puncture at some point.  Note: Lives at: Home Mobility:Independent Chronic  Indwelling Foley:no   REVIEW OF SYSTEMS:  Constitutional:   No  weight loss, night sweats,  Fevers, chills, fatigue.  HEENT:    No headaches, Dysphagia,Tooth/dental problems  Cardio-vascular: No chest pain,Orthopnea, PND,lower extremity edema, anasarca, palpitations  GI:  No heartburn, indigestion, abdominal pain, nausea, vomiting, diarrhea, melena or hematochezia  Resp: No shortness of breath,hemoptysis,plueritic chest pain.   Skin:  No rash or lesions.  GU:  No dysuria, change in color of urine, no urgency or frequency.  No flank pain.  Musculoskeletal: No joint pain or swelling.  No decreased range of motion.   Endocrine: No heat intolerance, no cold intolerance, no polyuria, no polydipsia  Psych: No change in mood or affect. No depression or anxiety.  No memory loss.   ALLERGIES:   Allergies  Allergen Reactions  . Sumatriptan Other (See Comments)    REACTION: altered mental status  . Olanzapine Other (See Comments)    STIFF JOINTS, EXTREMITY WEAKNESS, MEMORY LOSS, LOSS OF BALANCE    PAST MEDICAL HISTORY: Past Medical History:  Diagnosis Date  . Anxiety   . Arthritis    back  . Chronic cystitis   . Chronic low back pain   . Depression   . Diverticulosis of colon   . Hemorrhoids   . History of colon polyps    09-24-2009  rectal hyperplastic polyp/   and 2016 non-cancerous  . History of endometriosis   . History of panic attacks   . Hyperlipidemia   . Osteopenia   . Pelvic pain   . Sensation of pressure in bladder area   . Wears glasses  PAST SURGICAL HISTORY: Past Surgical History:  Procedure Laterality Date  . BUNIONECTOMY Left 2004 approx  . CARPAL TUNNEL RELEASE Bilateral right 2014;  left 2007  . COLONOSCOPY W/ POLYPECTOMY  09/24/2009  . CYSTO WITH HYDRODISTENSION [name] 10/15/2016   Procedure: CYSTOSCOPY/HYDRODISTENSION AND MARCAINE INSTILLATION;  Surgeon: Rana Snare, MD;  Location: [facility];  Service: Urology;   Laterality: [name];  . CYSTO/  HYDRODISTENTION/  INSTILLATION THERAPY  1997 approx  . HYSTEROSCOPY W/D&C  11/01/2004   POLYPECTOMY  . LAPAROSCOPIC VAGINAL HYSTERECTOMY WITH SALPINGO OOPHORECTOMY Bilateral 04/20/2012  . TUBAL LIGATION  yrs ago    MEDICATIONS AT HOME: Prior to Admission medications   Medication Sig Start Date End Date Taking? Authorizing Provider  Alum Hydroxide-Mag Carbonate (GAVISCON PO) Take by mouth as needed.    Historical Provider, MD  Ascorbic Acid (VITAMIN C) 1000 MG tablet Take 1,000 mg by mouth daily.    Historical Provider, MD  aspirin EC 325 MG tablet Take 325 mg by mouth daily.    Historical Provider, MD  buPROPion (WELLBUTRIN XL) 150 MG 24 hr tablet Take 150 mg by mouth every morning.    Historical Provider, MD  Cholecalciferol (VITAMIN D3) 2000 units TABS Take 2 capsules by mouth daily.    Historical Provider, MD  diazepam (VALIUM) 10 MG tablet Take 10 mg by mouth every 12 (twelve) hours as needed for anxiety.    Historical Provider, MD  Fish Oil-Cholecalciferol (OMEGA-3 FISH OIL-VITAMIN D3) 1200-1000 MG-UNIT CAPS Take 1 capsule by mouth daily.    Historical Provider, MD  HYDROcodone-acetaminophen (NORCO/VICODIN) 5-325 MG tablet Take 1 tablet by mouth every 6 (six) hours as needed for moderate pain.    Historical Provider, MD  HYDROcodone-acetaminophen (NORCO/VICODIN) 5-325 MG tablet Take 1-2 tablets by mouth every 6 (six) hours as needed. 10/15/16   Rana Snare, MD  Magnesium 250 MG TABS Take 850 mg by mouth every morning.    Historical Provider, MD  Meth-Hyo-M Bl-Na Phos-Ph Sal (USTELL) 120 MG CAPS Take 1 capsule (120 mg total) by mouth 3 (three) times daily as needed. 10/15/16   Rana Snare, MD  Multiple Vitamin (MULTIVITAMIN) tablet Take 1 tablet by mouth daily.    Historical Provider, MD  Potassium 95 MG TABS Take 1 tablet by mouth daily.    Historical Provider, MD  Probiotic Product (PROBIOTIC DAILY) CAPS Take 1 capsule by mouth daily.    Historical Provider,  MD  rizatriptan (MAXALT) 10 MG tablet Take 1 tablet (10 mg total) by mouth as needed. May repeat in 2 hours if needed 12/05/13   Hendricks Limes, MD  zolpidem (AMBIEN) 10 MG tablet Take 10 mg by mouth at bedtime.    Historical Provider, MD    FAMILY HISTORY: Family History  Problem Relation Age of Onset  . Hypertension Mother   . Dementia Mother   . Lung cancer Mother     smoker  . Osteoporosis Mother   . Heart attack Father 37  . Hypertension Brother   . Diabetes Maternal Aunt   . Cancer Maternal Uncle     RENAL  . Heart attack Maternal Grandmother 86  . Lung cancer Maternal Grandfather     Ecologist  . Pulmonary embolism Brother     ? etiology  . Anesthesia problems Neg Hx   . Stroke Neg Hx     SOCIAL HISTORY:  reports that she has never smoked. She has never used smokeless tobacco. She reports that she does not drink alcohol or  use drugs.  PHYSICAL EXAM: Blood pressure 146/82, pulse 63, temperature (!) 96 F (35.6 C), temperature source Rectal, resp. rate 15, height 5\' 2"  (1.575 m), weight 69.4 kg (153 lb), SpO2 98 %.  General appearance :Awake, alert, not in any distress. Speech Clear. Not toxic Looking Eyes:, pupils equally reactive to light and accomodation,no scleral icterus.Pink conjunctiva HEENT: Atraumatic and Normocephalic Neck: supple, no JVD. No cervical lymphadenopathy. No thyromegaly Resp:Good air entry bilaterally, no added sounds  CVS: S1 S2 regular, no murmurs.  GI: Bowel sounds present, Non tender and not distended with no gaurding, rigidity or rebound.No organomegaly Extremities: B/L Lower Ext shows no edema, both legs are warm to touch Neurology:    Motor-5/5 all over-Left upper ext limited due to pain in the shoulder area.   Sensation-grossly intact  DTR: unable to obtain Knee/Ankle Reflex-able to obtain reflex in the upper ext Psychiatric: Normal judgment and insight. Alert and oriented x 3. Normal mood. Musculoskeletal:gait appears to be  normal.No digital cyanosis Skin:No Rash, warm and dry Wounds:[name]  LABS ON ADMISSION:  I have personally reviewed following labs and imaging studies  CBC:  Recent Labs Lab 01/11/17 2226 01/12/17 1204  WBC 10.0 11.5*  NEUTROABS  --  6.6  HGB 13.9 14.2  HCT 42.2 42.9  MCV 80.2 80.3  PLT 311 [id number]    Basic Metabolic Panel:  Recent Labs Lab 01/11/17 2226 01/12/17 1204  NA 137 139  K 3.9 2.8*  CL 105 107  CO2 23 20*  GLUCOSE 129* 96  BUN 18 11  CREATININE 1.65* 1.14*  CALCIUM 9.1 9.1    GFR: Estimated Creatinine Clearance: 45.5 mL/min (by C-G formula based on SCr of 1.14 mg/dL (H)).  Liver Function Tests:  Recent Labs Lab 01/12/17 1204  AST 28  ALT 18  ALKPHOS 46  BILITOT 0.9  PROT 6.7  ALBUMIN 4.0   No results for input(s): LIPASE, AMYLASE in the last 168 hours. No results for input(s): AMMONIA in the last 168 hours.  Coagulation Profile: No results for input(s): INR, PROTIME in the last 168 hours.  Cardiac Enzymes:  Recent Labs Lab 01/11/17 2226  CKTOTAL 123    BNP (last 3 results) No results for input(s): PROBNP in the last 8760 hours.  HbA1C: No results for input(s): HGBA1C in the last 72 hours.  CBG: No results for input(s): GLUCAP in the last 168 hours.  Lipid Profile: No results for input(s): CHOL, HDL, LDLCALC, TRIG, CHOLHDL, LDLDIRECT in the last 72 hours.  Thyroid Function Tests: No results for input(s): TSH, T4TOTAL, FREET4, T3FREE, THYROIDAB in the last 72 hours.  Anemia Panel: No results for input(s): VITAMINB12, FOLATE, FERRITIN, TIBC, IRON, RETICCTPCT in the last 72 hours.  Urine analysis:    Component Value Date/Time   COLORURINE STRAW (A) 01/11/2017 2337   APPEARANCEUR CLEAR 01/11/2017 2337   LABSPEC 1.008 01/11/2017 2337   PHURINE 9.0 (H) 01/11/2017 2337   GLUCOSEU NEGATIVE 01/11/2017 2337   GLUCOSEU NEGATIVE 12/29/2006 0738   HGBUR NEGATIVE 01/11/2017 2337   HGBUR negative 12/05/2009 0933   BILIRUBINUR NEGATIVE  01/11/2017 2337   BILIRUBINUR neg 07/08/2012 1218   KETONESUR 5 (A) 01/11/2017 2337   PROTEINUR NEGATIVE 01/11/2017 2337   UROBILINOGEN 0.2 07/08/2012 1218   UROBILINOGEN 0.2 12/05/2009 0933   NITRITE NEGATIVE 01/11/2017 2337   LEUKOCYTESUR NEGATIVE 01/11/2017 2337    Sepsis Labs: Lactic Acid, Venous    Component Value Date/Time   LATICACIDVEN 2.22 ([place]) 01/12/2017 1225     Microbiology:  No results found for this or any previous visit (from the past 240 hour(s)).    RADIOLOGIC STUDIES ON ADMISSION: Mr Jeri Cos Wo Contrast  Result Date: 01/12/2017 CLINICAL DATA:  Bilateral lower extremity weakness and paresthesia EXAM: MRI HEAD WITHOUT AND WITH CONTRAST TECHNIQUE: Multiplanar, multiecho pulse sequences of the brain and surrounding structures were obtained without and with intravenous contrast. CONTRAST:  41mL MULTIHANCE GADOBENATE DIMEGLUMINE 529 MG/ML IV SOLN COMPARISON:  None. FINDINGS: Brain: The brain has normal appearance without evidence of malformation, atrophy, old or acute small or large vessel infarction, hemorrhage, hydrocephalus or extra-axial collection. No pituitary abnormality. No abnormal enhancement occurs. Vascular: Major vessels at the base of the brain show flow. Skull and upper cervical spine: Normal Sinuses/Orbits: Clear/ normal. Other: None significant. IMPRESSION: Normal exam. Electronically Signed   By: Nelson Chimes M.D.   On: 01/12/2017 16:37   Mr Cervical Spine W Wo Contrast  Result Date: 01/12/2017 CLINICAL DATA:  Lower extremity abnormal sensation in weakness beginning 3 days ago which is worsening. EXAM: MRI CERVICAL SPINE WITHOUT AND WITH CONTRAST TECHNIQUE: Multiplanar and multiecho pulse sequences of the cervical spine, to include the craniocervical junction and cervicothoracic junction, were obtained without and with intravenous contrast. CONTRAST:  7mL MULTIHANCE GADOBENATE DIMEGLUMINE 529 MG/ML IV SOLN COMPARISON:  None. FINDINGS: Study Sever's from  motion degradation. Alignment: Straightening of the normal cervical lordosis. Vertebrae: No fracture or primary bone lesion. Some discogenic endplate edema at [id number] could contribute to neck pain. Cord: No cord compression or primary cord lesion. Posterior Fossa, vertebral arteries, paraspinal tissues: Negative Disc levels: Foramen magnum, C1-2 and C2-3: Unremarkable. Facet arthropathy at C2-3 right more than left but without stenosis. C3-4: Mild bulging of the disc. No central canal stenosis. Facet arthropathy right more than left. No neural compression. C4-5: Endplate osteophytes. No compressive narrowing of the canal or foramina. C5-6: Spondylosis with endplate osteophytes that efface the ventral subarachnoid space. AP diameter of the canal is 8 mm. No actual cord compression. Mild foraminal stenosis bilaterally. C6-7: Spondylosis with endplate osteophytes and bulging of the disc. Narrowing of the ventral subarachnoid space but no cord compression. Foramina sufficiently patent. C7-T1:  Mild bulging of the disc.  No neural compression. IMPRESSION: Chronic degenerative changes of the cervical spine with spondylosis from C3-4 through C6-7. No cord compression or primary cord lesion. Canal narrowing at C5-6 with AP diameter of 8 mm. The subarachnoid space is effaced but the cord is not compressed. Foraminal narrowing at this level could possibly affect either or both C6 nerve roots. Electronically Signed   By: Nelson Chimes M.D.   On: 01/12/2017 16:27   Mr Thoracic Spine W Wo Contrast  Result Date: 01/12/2017 CLINICAL DATA:  bilateral lower extremity weakness and paresthesia EXAM: MRI THORACIC SPINE WITH and without  CONTRAST 15 cc MultiHance TECHNIQUE: Multiplanar, multisequence MR imaging of the thoracic spine was performed following the administration of intravenous contrast. COMPARISON:  None. FINDINGS: Alignment:  Mild thoracic curvature convex to the left. Vertebrae: No fracture or primary bone lesion in the  thoracic region. Cord:  No cord compression or primary cord lesion. Paraspinal and other soft tissues: Negative Disc levels: No degenerative disc disease in the thoracic region. No bulge or herniation. No canal or foraminal stenosis. IMPRESSION: Negative MRI of the thoracic spine.  No stenosis.  No cord lesion. Electronically Signed   By: Nelson Chimes M.D.   On: 01/12/2017 16:34   Mr Lumbar Spine W Wo Contrast  Result Date: 01/12/2017 CLINICAL  DATA:  Lower extremity decreased sensation an weakness. EXAM: MRI LUMBAR SPINE WITHOUT AND WITH CONTRAST TECHNIQUE: Multiplanar and multiecho pulse sequences of the lumbar spine were obtained without and with intravenous contrast. CONTRAST:  33mL MULTIHANCE GADOBENATE DIMEGLUMINE 529 MG/ML IV SOLN COMPARISON:  10/18/2016 FINDINGS: Segmentation:  L5 is transitional, as described previously. Alignment:  Curvature convex to the right with the apex at L1. Vertebrae:  No fracture or primary bone lesion. Conus medullaris: Extends to the L1 level and appears normal. Paraspinal and other soft tissues: Negative Disc levels: L1-2: Mild bulging of the disc.  No stenosis or neural compression. L2-3: Moderate bulging of the disc. Facet and ligamentous hypertrophy. Mild multifactorial stenosis. Some potential for neural compression in the lateral recesses, right more than left. L3-4: Endplate osteophytes and bulging of the disc. Facet and ligamentous hypertrophy. Narrowing of the lateral recesses that could cause neural compression, right more than left. L4-5: Bilateral facet arthropathy with anterolisthesis of 7 mm. Bulging of the disc. Narrowing of the lateral recesses and neural foramina right more than left. Neural compression could occur at this level, particularly on the right. L5-S1: Transitional and unremarkable. No apparent change since November. IMPRESSION: No change since November 2017. Transitional anatomy with L5 transitional. Bilateral facet arthropathy at L4-5 with  anterolisthesis of 7 mm. Stenosis of the lateral recesses and foramina right more than left. Neural compression could occur at this level, particularly on the right. Lateral recess stenosis at L2-3 and L3-4 that could possibly cause neural compression. Electronically Signed   By: Nelson Chimes M.D.   On: 01/12/2017 16:32   EKG:  Personally reviewed. NSR  ASSESSMENT AND PLAN: Present on Admission: . Lower Ext weakness/Neuropathic pain:? Etiology-given recent sore throat/strep infection-not sure if this is a demyelinating syndrome. MRI of the entire spine and brain negative for any acute structural lesions. Spoke with Neurology-Dr. Wilhemina Bonito will plan on doing a lumbar puncture to see if there is inflammation. Neurology does not plan on empiric treatment unless dictated  by lumbar puncture results. We will admit the patient to a telemetry unit, obtain PT eval, monitor FVC/NIF.Will check HIV,TSH,Vit B12, RPR and CK levels as well. Await further recommendations from neurology.  . Depression with anxiety: Continue Wellbutrin as needed Valium  . Chronic low back pain: No acute lesions seen on MRI of the entire spine-continue as needed narcotics  . Hypokalemia: We'll replete and recheck labs in a.m.  Further plan will depend as patient's clinical course evolves and further radiologic and laboratory data become available. Patient will be monitored closely.  Above noted plan was discussed with patient/spouse face to face at bedside, they were in agreement.   CONSULTS: Neuro  DVT Prophylaxis: SCD's  Code Status: Full Code  Disposition Plan:  Discharge back home vs SNF possibly in 1-2 days, depending on clinical course  Admission status: Observation going to tele  Total time spent  55 minutes.Greater than 50% of this time was spent in counseling, explanation of diagnosis, planning of further management, and coordination of care.  [facility] Pager  [phone]  If 7PM-7AM, please contact night-coverage [email or web address] Password [facility] 01/12/2017, 6:07 PM

## 2017-01-12 NOTE — ED Notes (Signed)
Placed pt on bedpan, pt stated she could not void on bedpan. Pt asked for bedside toilet. Pt is two assist for bedside toilet. Pt had med. Void and med. bm.

## 2017-01-13 ENCOUNTER — Inpatient Hospital Stay (HOSPITAL_COMMUNITY): Payer: Medicare Other

## 2017-01-13 DIAGNOSIS — B999 Unspecified infectious disease: Secondary | ICD-10-CM

## 2017-01-13 DIAGNOSIS — G610 Guillain-Barre syndrome: Principal | ICD-10-CM | POA: Diagnosis present

## 2017-01-13 DIAGNOSIS — M5440 Lumbago with sciatica, unspecified side: Secondary | ICD-10-CM

## 2017-01-13 DIAGNOSIS — R531 Weakness: Secondary | ICD-10-CM | POA: Diagnosis not present

## 2017-01-13 DIAGNOSIS — E876 Hypokalemia: Secondary | ICD-10-CM | POA: Diagnosis not present

## 2017-01-13 DIAGNOSIS — F329 Major depressive disorder, single episode, unspecified: Secondary | ICD-10-CM | POA: Diagnosis not present

## 2017-01-13 DIAGNOSIS — G8929 Other chronic pain: Secondary | ICD-10-CM | POA: Diagnosis not present

## 2017-01-13 DIAGNOSIS — G63 Polyneuropathy in diseases classified elsewhere: Secondary | ICD-10-CM

## 2017-01-13 LAB — CSF CELL COUNT WITH DIFFERENTIAL
RBC Count, CSF: 6 /mm3 — ABNORMAL HIGH
Tube #: 1
WBC CSF: 3 /mm3 (ref 0–5)

## 2017-01-13 LAB — BASIC METABOLIC PANEL
ANION GAP: 15 (ref 5–15)
BUN: 8 mg/dL (ref 6–20)
CO2: 19 mmol/L — AB (ref 22–32)
Calcium: 9.3 mg/dL (ref 8.9–10.3)
Chloride: 103 mmol/L (ref 101–111)
Creatinine, Ser: 0.95 mg/dL (ref 0.44–1.00)
GFR calc Af Amer: >60 mL/min (ref >60)
GLUCOSE: 110 mg/dL — AB (ref 65–99)
POTASSIUM: 3.3 mmol/L — AB (ref 3.5–5.1)
Sodium: 137 mmol/L (ref 135–145)

## 2017-01-13 LAB — RPR: RPR: NONREACTIVE

## 2017-01-13 LAB — CBC
HCT: 42.2 % (ref 36.0–46.0)
Hemoglobin: 14.2 g/dL (ref 12.0–15.0)
MCH: 26.5 pg (ref 26.0–34.0)
MCHC: 33.6 g/dL (ref 30.0–36.0)
MCV: 78.7 fL (ref 78.0–100.0)
PLATELETS: 355 10*3/uL (ref 150–400)
RBC: 5.36 MIL/uL — ABNORMAL HIGH (ref 3.87–5.11)
RDW: 13.6 % (ref 11.5–15.5)
WBC: 11.9 10*3/uL — AB (ref 4.0–10.5)

## 2017-01-13 LAB — PROTEIN, CSF: Total  Protein, CSF: 79 mg/dL — ABNORMAL HIGH (ref 15–45)

## 2017-01-13 LAB — GLUCOSE, CSF: GLUCOSE CSF: 55 mg/dL (ref 40–70)

## 2017-01-13 LAB — HIV ANTIBODY (ROUTINE TESTING W REFLEX): HIV SCREEN 4TH GENERATION: NONREACTIVE

## 2017-01-13 MED ORDER — DEXTROSE 5 % IV SOLN
INTRAVENOUS | Status: DC
  Administered 2017-01-13: 06:00:00 via INTRAVENOUS

## 2017-01-13 MED ORDER — POTASSIUM CHLORIDE CRYS ER 20 MEQ PO TBCR
40.0000 meq | EXTENDED_RELEASE_TABLET | Freq: Once | ORAL | Status: AC
  Administered 2017-01-13: 40 meq via ORAL
  Filled 2017-01-13: qty 2

## 2017-01-13 MED ORDER — MORPHINE SULFATE (PF) 2 MG/ML IV SOLN
2.0000 mg | INTRAVENOUS | Status: DC | PRN
  Administered 2017-01-13 – 2017-01-15 (×8): 2 mg via INTRAVENOUS
  Filled 2017-01-13 (×8): qty 1

## 2017-01-13 MED ORDER — IMMUNE GLOBULIN (HUMAN) 20 GM/200ML IV SOLN
400.0000 mg/kg | INTRAVENOUS | Status: AC
  Administered 2017-01-13 – 2017-01-17 (×5): 30 g via INTRAVENOUS
  Filled 2017-01-13 (×8): qty 100

## 2017-01-13 MED ORDER — LIDOCAINE 5 % EX PTCH
1.0000 | MEDICATED_PATCH | CUTANEOUS | Status: DC
  Administered 2017-01-13 – 2017-02-03 (×18): 1 via TRANSDERMAL
  Filled 2017-01-13 (×24): qty 1

## 2017-01-13 MED ORDER — OXYCODONE HCL 5 MG PO TABS
5.0000 mg | ORAL_TABLET | ORAL | Status: AC | PRN
  Administered 2017-01-13 (×2): 5 mg via ORAL
  Filled 2017-01-13 (×3): qty 1

## 2017-01-13 MED ORDER — LIDOCAINE HCL 1 % IJ SOLN
INTRAMUSCULAR | Status: AC
  Administered 2017-01-13: 16:00:00
  Filled 2017-01-13: qty 10

## 2017-01-13 MED ORDER — GABAPENTIN 300 MG PO CAPS
300.0000 mg | ORAL_CAPSULE | Freq: Three times a day (TID) | ORAL | Status: DC
  Administered 2017-01-13: 300 mg via ORAL
  Filled 2017-01-13: qty 1

## 2017-01-13 MED ORDER — LIDOCAINE HCL (PF) 1 % IJ SOLN
10.0000 mL | Freq: Once | INTRAMUSCULAR | Status: AC
  Administered 2017-01-13: 2.5 mL via INTRADERMAL

## 2017-01-13 MED ORDER — LORAZEPAM 2 MG/ML IJ SOLN
1.0000 mg | Freq: Four times a day (QID) | INTRAMUSCULAR | Status: DC | PRN
  Administered 2017-01-13 – 2017-01-15 (×2): 1 mg via INTRAVENOUS
  Filled 2017-01-13 (×2): qty 1

## 2017-01-13 NOTE — Progress Notes (Signed)
Husband called staff to the room and stated that pt. Had passed out; RN and NT in room and pt. responded when name called; pt. Stated that she felt like passing out or want to pass out with the pain she has in her back. Recently given pain med and will give Valium shortly. Warm pads to back.

## 2017-01-13 NOTE — Care Management Note (Signed)
Case Management Note  Patient Details  Name: Joanne Morrison MRN: [id number] Date of Birth: 13-Sep-1951  Subjective/Objective:           Patient was admitted with lower extremity weakness/neuropathic pain. Lives at home with spouse. CM will follow for discharge needs pending PT/OT evals and physician orders.          Action/Plan:   Expected Discharge Date:                  Expected Discharge Plan:     In-House Referral:     Discharge planning Services     Post Acute Care Choice:    Choice offered to:     DME Arranged:    DME Agency:     HH Arranged:    HH Agency:     Status of Service:     If discussed at [employer] of Stay Meetings, dates discussed:    Additional Comments:  Rolm Baptise, RN 01/13/2017, 10:49 AM

## 2017-01-13 NOTE — Progress Notes (Signed)
Pt had good effort  VC 2.2L  And NIF -60+

## 2017-01-13 NOTE — Progress Notes (Signed)
Bladder scan complete; 836 mL urine detected.In/Out urinary catheter completed, 800 mL clear, yellow urine removed. RN will continue to monitor. MD paged.

## 2017-01-13 NOTE — Progress Notes (Signed)
Patient refusing NIF and VC at this time, patient stated she had a spinal tap earlier today and is in too much pain. RT made pt aware that I would check back later and see if she would attempt it. Family at bedside.

## 2017-01-13 NOTE — Progress Notes (Signed)
Dr. Cheral Marker requested TRH to call him tonight in order to discuss patients current treatment plan. TRH, MD paged. RN will continue to monitor.

## 2017-01-13 NOTE — Progress Notes (Signed)
Patient 8/10 generalized pins and needles pain in BLE, trunk and BUE. Patients states she self administered 20 mg of valium at aproximately 2300 and 3 5-325 mg tabs Vicodin PO at approximately 2200 from her home medications due to being in so much pain. Patient and family educated on medication and v/u. RN and CN counted home medications, all forms complete and medications delivered to pharmacy per protocol.RN will continue to closely monitor patient.

## 2017-01-13 NOTE — Progress Notes (Signed)
I/O cath completed, 81mL clear yellow urine removed. Patient states pain 7/10 pins needles pain in back, LE, UE. Neuro consulted. Internal hospitalist paged. RN will continue to monitor patient.

## 2017-01-13 NOTE — Progress Notes (Signed)
Rehab Admissions Coordinator Note:  Patient was screened by Cleatrice Burke for appropriateness for an Inpatient Acute Rehab Consult per PT recommendation.  At this time, we are recommending Inpatient Rehab consult.  Cleatrice Burke 01/13/2017, 11:38 AM  I can be reached at [phone].

## 2017-01-13 NOTE — Progress Notes (Signed)
Neuro paged 

## 2017-01-13 NOTE — Progress Notes (Signed)
Visit made to patients room to have patient attempt VC/NIF .Marland Kitchen Vital Capacity was 60 and Neg Insp Force was 1.6 liters.  Advised patient she will have this test every 4 hours today.

## 2017-01-13 NOTE — Progress Notes (Signed)
Pt is out of the room for a test. Team to follow up in the AM. Given a review of the chart her presentation is suspicious for non-organic syndrome/conversion disorder.    Meredith Staggers, MD, [facility] 01/13/2017

## 2017-01-13 NOTE — Progress Notes (Signed)
PROGRESS NOTE        PATIENT DETAILS Name: Joanne Morrison Age: 66 y.o. Sex: female Date of Birth: [birth year] Admit Date: 01/12/2017 Admitting Physician Evalee Mutton Kristeen Mans, MD [id number] A, MD  Brief Narrative: Patient is a 66 y.o. female with medical history significant of chronic low back pain, anxiety, depression, hypertension-recently had sorethroat (+strep) presented to the ED on 1/29 with lower ext weakness,tingling numbess that started in the lower extremities and subsequently started involving her upper extremities. Unable to elicit DTR in the lower ext. MRI of the entire spine negative. Concern for GBS-started on empiric IVIG by neurology last night.  Subjective: Developed an episode of acute urinary retention-requiring in and out catheterization. Seems slightly anxious today.  Assessment/Plan: ? Guillain-Barr syndrome: Started on IVIG empirically-as noted above MRI of the brain and entire spine negative for acute abnormalities. Neurology exam remains essentially unchanged compared to yesterday. Awaiting neurology follow-up and further recommendations. Defer decision of whether pursuing lumbar puncture posttreatment or now to neurology.  Depression with anxiety: Continue Wellbutrin as needed Valium  Chronic low back pain: No acute lesions seen on MRI of the entire spine-continue as needed narcotics  Hypokalemia: Replete and recheck labs in a.m  DVT Prophylaxis: SCD's till decision regarding LP has been made.   Code Status: Full code   Family Communication: Spouse at bedside  Disposition Plan: Remain inpatient-but will plan on Home health vs SNF on discharge-will require several days of hospitalization  Antimicrobial agents: Anti-infectives    None      Procedures: None  CONSULTS:  neurology  Time spent: 25- minutes-Greater than 50% of this time was spent in counseling, explanation of diagnosis, planning of further management,  and coordination of care.  MEDICATIONS: Scheduled Meds: . aspirin EC  325 mg Oral Daily  . buPROPion  150 mg Oral q morning - 10a  . dextrose   Intravenous See admin instructions  . Immune Globulin 10%  400 mg/kg Intravenous Q24 Hr x 5  . omega-3 acid ethyl esters  1 g Oral Daily  . sodium chloride flush  3 mL Intravenous Q12H  . vitamin C  1,000 mg Oral Daily  . zolpidem  5 mg Oral QHS   Continuous Infusions: . sodium chloride Stopped (01/13/17 0621)   PRN Meds:.acetaminophen **OR** acetaminophen, albuterol, diazepam, HYDROcodone-acetaminophen, ondansetron **OR** ondansetron (ZOFRAN) IV, oxyCODONE   PHYSICAL EXAM: Vital signs: Vitals:   01/13/17 0705 01/13/17 0715 01/13/17 0732 01/13/17 0900  BP:  (!) 172/89 (!) 168/84 (!) 163/98  Pulse:  65  60  Resp:    20  Temp: 97.8 F (36.6 C)   97.8 F (36.6 C)  TempSrc: Oral   Axillary  SpO2:    100%  Weight:      Height:       Filed Weights   01/12/17 0803 01/12/17 2126  Weight: 69.4 kg (153 lb) 71.5 kg (157 lb 10.1 oz)   Body mass index is 28.83 kg/m.   General appearance :Awake, alert, not in any distress. Speech Clear. Eyes:, pupils equally reactive to light and accomodation,no scleral icterus. HEENT: Atraumatic and Normocephalic Neck: supple, no JVD. No cervical lymphadenopathy. Resp:Good air entry bilaterally, no added sounds  CVS: S1 S2 regular, no murmurs.  GI: Bowel sounds present, Non tender and not distended with no gaurding, rigidity or rebound.No organomegaly Extremities: B/L Lower Ext shows no  edema, both legs are warm to touch Neurology: Sensation is grossly intact, approximately 5/5 strength in her lower extremities-mild left upper extremity weakness-unable to elicit deep tendon reflexes in lower extremities. Psychiatric: Normal judgment and insight. Alert and oriented x 3. Slightly anxious Musculoskeletal:No digital cyanosis Skin:No Rash, warm and dry Wounds:[name]  I have personally reviewed following labs  and imaging studies  LABORATORY DATA: CBC:  Recent Labs Lab 01/11/17 2226 01/12/17 1204 01/13/17 0328  WBC 10.0 11.5* 11.9*  NEUTROABS  --  6.6  --   HGB 13.9 14.2 14.2  HCT 42.2 42.9 42.2  MCV 80.2 80.3 78.7  PLT 311 289 [id number]    Basic Metabolic Panel:  Recent Labs Lab 01/11/17 2226 01/12/17 1204 01/13/17 0328  NA 137 139 137  K 3.9 2.8* 3.3*  CL 105 107 103  CO2 23 20* 19*  GLUCOSE 129* 96 110*  BUN 18 11 8   CREATININE 1.65* 1.14* 0.95  CALCIUM 9.1 9.1 9.3    GFR: Estimated Creatinine Clearance: 55.4 mL/min (by C-G formula based on SCr of 0.95 mg/dL).  Liver Function Tests:  Recent Labs Lab 01/12/17 1204  AST 28  ALT 18  ALKPHOS 46  BILITOT 0.9  PROT 6.7  ALBUMIN 4.0   No results for input(s): LIPASE, AMYLASE in the last 168 hours. No results for input(s): AMMONIA in the last 168 hours.  Coagulation Profile: No results for input(s): INR, PROTIME in the last 168 hours.  Cardiac Enzymes:  Recent Labs Lab 01/11/17 2226 01/12/17 2215  CKTOTAL 123 280*    BNP (last 3 results) No results for input(s): PROBNP in the last 8760 hours.  HbA1C: No results for input(s): HGBA1C in the last 72 hours.  CBG: No results for input(s): GLUCAP in the last 168 hours.  Lipid Profile: No results for input(s): CHOL, HDL, LDLCALC, TRIG, CHOLHDL, LDLDIRECT in the last 72 hours.  Thyroid Function Tests:  Recent Labs  01/12/17 2215  TSH 1.645    Anemia Panel:  Recent Labs  01/12/17 2215  VITAMINB12 726    Urine analysis:    Component Value Date/Time   COLORURINE STRAW (A) 01/11/2017 2337   APPEARANCEUR CLEAR 01/11/2017 2337   LABSPEC 1.008 01/11/2017 2337   PHURINE 9.0 (H) 01/11/2017 2337   GLUCOSEU NEGATIVE 01/11/2017 2337   GLUCOSEU NEGATIVE 12/29/2006 0738   HGBUR NEGATIVE 01/11/2017 2337   HGBUR negative 12/05/2009 0933   BILIRUBINUR NEGATIVE 01/11/2017 2337   BILIRUBINUR neg 07/08/2012 1218   KETONESUR 5 (A) 01/11/2017 2337    PROTEINUR NEGATIVE 01/11/2017 2337   UROBILINOGEN 0.2 07/08/2012 1218   UROBILINOGEN 0.2 12/05/2009 0933   NITRITE NEGATIVE 01/11/2017 2337   LEUKOCYTESUR NEGATIVE 01/11/2017 2337    Sepsis Labs: Lactic Acid, Venous    Component Value Date/Time   LATICACIDVEN 2.22 (HH) 01/12/2017 1225    MICROBIOLOGY: No results found for this or any previous visit (from the past 240 hour(s)).  RADIOLOGY STUDIES/RESULTS: Mr Jeri Cos Wo Contrast  Result Date: 01/12/2017 CLINICAL DATA:  Bilateral lower extremity weakness and paresthesia EXAM: MRI HEAD WITHOUT AND WITH CONTRAST TECHNIQUE: Multiplanar, multiecho pulse sequences of the brain and surrounding structures were obtained without and with intravenous contrast. CONTRAST:  68mL MULTIHANCE GADOBENATE DIMEGLUMINE 529 MG/ML IV SOLN COMPARISON:  None. FINDINGS: Brain: The brain has normal appearance without evidence of malformation, atrophy, old or acute small or large vessel infarction, hemorrhage, hydrocephalus or extra-axial collection. No pituitary abnormality. No abnormal enhancement occurs. Vascular: Major vessels at the base of the  brain show flow. Skull and upper cervical spine: Normal Sinuses/Orbits: Clear/ normal. Other: None significant. IMPRESSION: Normal exam. Electronically Signed   By: Nelson Chimes M.D.   On: 01/12/2017 16:37   Mr Cervical Spine W Wo Contrast  Result Date: 01/12/2017 CLINICAL DATA:  Lower extremity abnormal sensation in weakness beginning 3 days ago which is worsening. EXAM: MRI CERVICAL SPINE WITHOUT AND WITH CONTRAST TECHNIQUE: Multiplanar and multiecho pulse sequences of the cervical spine, to include the craniocervical junction and cervicothoracic junction, were obtained without and with intravenous contrast. CONTRAST:  79mL MULTIHANCE GADOBENATE DIMEGLUMINE 529 MG/ML IV SOLN COMPARISON:  None. FINDINGS: Study Sever's from motion degradation. Alignment: Straightening of the normal cervical lordosis. Vertebrae: No fracture or  primary bone lesion. Some discogenic endplate edema at [id number] could contribute to neck pain. Cord: No cord compression or primary cord lesion. Posterior Fossa, vertebral arteries, paraspinal tissues: Negative Disc levels: Foramen magnum, C1-2 and C2-3: Unremarkable. Facet arthropathy at C2-3 right more than left but without stenosis. C3-4: Mild bulging of the disc. No central canal stenosis. Facet arthropathy right more than left. No neural compression. C4-5: Endplate osteophytes. No compressive narrowing of the canal or foramina. C5-6: Spondylosis with endplate osteophytes that efface the ventral subarachnoid space. AP diameter of the canal is 8 mm. No actual cord compression. Mild foraminal stenosis bilaterally. C6-7: Spondylosis with endplate osteophytes and bulging of the disc. Narrowing of the ventral subarachnoid space but no cord compression. Foramina sufficiently patent. C7-T1:  Mild bulging of the disc.  No neural compression. IMPRESSION: Chronic degenerative changes of the cervical spine with spondylosis from C3-4 through C6-7. No cord compression or primary cord lesion. Canal narrowing at C5-6 with AP diameter of 8 mm. The subarachnoid space is effaced but the cord is not compressed. Foraminal narrowing at this level could possibly affect either or both C6 nerve roots. Electronically Signed   By: Nelson Chimes M.D.   On: 01/12/2017 16:27   Mr Thoracic Spine W Wo Contrast  Result Date: 01/12/2017 CLINICAL DATA:  bilateral lower extremity weakness and paresthesia EXAM: MRI THORACIC SPINE WITH and without  CONTRAST 15 cc MultiHance TECHNIQUE: Multiplanar, multisequence MR imaging of the thoracic spine was performed following the administration of intravenous contrast. COMPARISON:  None. FINDINGS: Alignment:  Mild thoracic curvature convex to the left. Vertebrae: No fracture or primary bone lesion in the thoracic region. Cord:  No cord compression or primary cord lesion. Paraspinal and other soft tissues:  Negative Disc levels: No degenerative disc disease in the thoracic region. No bulge or herniation. No canal or foraminal stenosis. IMPRESSION: Negative MRI of the thoracic spine.  No stenosis.  No cord lesion. Electronically Signed   By: Nelson Chimes M.D.   On: 01/12/2017 16:34   Mr Lumbar Spine W Wo Contrast  Result Date: 01/12/2017 CLINICAL DATA:  Lower extremity decreased sensation an weakness. EXAM: MRI LUMBAR SPINE WITHOUT AND WITH CONTRAST TECHNIQUE: Multiplanar and multiecho pulse sequences of the lumbar spine were obtained without and with intravenous contrast. CONTRAST:  70mL MULTIHANCE GADOBENATE DIMEGLUMINE 529 MG/ML IV SOLN COMPARISON:  10/18/2016 FINDINGS: Segmentation:  L5 is transitional, as described previously. Alignment:  Curvature convex to the right with the apex at L1. Vertebrae:  No fracture or primary bone lesion. Conus medullaris: Extends to the L1 level and appears normal. Paraspinal and other soft tissues: Negative Disc levels: L1-2: Mild bulging of the disc.  No stenosis or neural compression. L2-3: Moderate bulging of the disc. Facet and ligamentous hypertrophy. Mild multifactorial  stenosis. Some potential for neural compression in the lateral recesses, right more than left. L3-4: Endplate osteophytes and bulging of the disc. Facet and ligamentous hypertrophy. Narrowing of the lateral recesses that could cause neural compression, right more than left. L4-5: Bilateral facet arthropathy with anterolisthesis of 7 mm. Bulging of the disc. Narrowing of the lateral recesses and neural foramina right more than left. Neural compression could occur at this level, particularly on the right. L5-S1: Transitional and unremarkable. No apparent change since November. IMPRESSION: No change since November 2017. Transitional anatomy with L5 transitional. Bilateral facet arthropathy at L4-5 with anterolisthesis of 7 mm. Stenosis of the lateral recesses and foramina right more than left. Neural  compression could occur at this level, particularly on the right. Lateral recess stenosis at L2-3 and L3-4 that could possibly cause neural compression. Electronically Signed   By: Nelson Chimes M.D.   On: 01/12/2017 16:32     LOS: 0 days   Oren Binet, MD  [facility] Pager:[phone]  If 7PM-7AM, please contact night-coverage [email or web address] Password [email or web address] 01/13/2017, 10:37 AM

## 2017-01-13 NOTE — Evaluation (Signed)
Physical Therapy Evaluation Patient Details Name: Joanne Morrison MRN: [id number] DOB: 1951/01/15 Today's Date: 01/13/2017   History of Present Illness  Patient is a 66 y.o. female with medical history significant of chronic low back pain, anxiety, depression, hypertension-recently had sorethroat (+strep) presented to the ED on 1/29 with lower ext weakness,tingling numbess that started in the lower extremities and subsequently started involving her upper extremities. Unable to elicit DTR in the lower ext. MRI of the entire spine negative. Concern for GBS-started on empiric IVIG by neurology last night.  Clinical Impression  Patient presents with global weakness and pain limiting independence and safety with mobility.  She presents at mod to max A currently for mobility and was independent prior to onset of weakness.  Feel she will benefit from skilled PT in the acute setting to address issues.  Feel she may benefit from CIR rehab prior to d/c home.     Follow Up Recommendations CIR    Equipment Recommendations  Other (comment) (TBA depending on recovery)    Recommendations for Other Services       Precautions / Restrictions Precautions Precautions: Fall      Mobility  Bed Mobility Overal bed mobility: Needs Assistance Bed Mobility: Rolling;Sidelying to Sit;Sit to Supine Rolling: Min assist Sidelying to sit: Mod assist;HOB elevated   Sit to supine: Mod assist   General bed mobility comments: assist for legs off bed and trunk upright to sit, assist for legs into bed to supine  Transfers Overall transfer level: Needs assistance Equipment used: None Transfers: Squat Pivot Transfers     Squat pivot transfers: Max assist     General transfer comment: lifting assist to scoot pivot along EOB toward [facility]  Ambulation/Gait             General Gait Details: unable  Stairs            Wheelchair Mobility    Modified Rankin (Stroke Patients Only)       Balance  Overall balance assessment: Needs assistance Sitting-balance support: Bilateral upper extremity supported;Feet unsupported Sitting balance-Leahy Scale: Poor Sitting balance - Comments: mod support at times minguard for sitting balance, fatigues easily sitting on EOB to take meds                                     Pertinent Vitals/Pain Pain Assessment: Faces Faces Pain Scale: Hurts whole lot Pain Location: bilateral LE's thighs and feet  Pain Descriptors / Indicators: Numbness;Tingling;Burning Pain Intervention(s): Monitored during session;Repositioned;Limited activity within patient's tolerance    Home Living Family/patient expects to be discharged to:: Private residence Living Arrangements: Spouse/significant other Available Help at Discharge: Family;Available 24 hours/day (can take LOA) Type of Home: House Home Access: Stairs to enter Entrance Stairs-Rails: Psychiatric nurse of Steps: 4 Home Layout: One level Home Equipment: Shower seat;Grab bars - tub/shower      Prior Function Level of Independence: Independent               Hand Dominance   Dominant Hand: Right    Extremity/Trunk Assessment   Upper Extremity Assessment Upper Extremity Assessment: LUE deficits/detail;RUE deficits/detail RUE Deficits / Details: AAROM WFL, strength shoulder flexion 2+/5, elbow flex 4-/5, ext 3+/5 LUE Deficits / Details: AAROM WFL, strength shoulder flex 2-/5, elbow flex 4-/5, ext 2+/5, grip weaker than R    Lower Extremity Assessment Lower Extremity Assessment: LLE deficits/detail;RLE deficits/detail RLE Deficits / Details: AAROM  WFL, strength hip flexion 3-/5, knee extension 4/5, ankle DF 3+/5 RLE Sensation: decreased light touch LLE Deficits / Details: AAROM WFL, strength hip flexion 2+/5, knee extension 4-/5, ankle DF 3-/5 LLE Sensation: decreased light touch       Communication   Communication: No difficulties  Cognition  Arousal/Alertness: Awake/alert Behavior During Therapy: Anxious Overall Cognitive Status: Within Functional Limits for tasks assessed                      General Comments      Exercises     Assessment/Plan    PT Assessment Patient needs continued PT services  PT Problem List Decreased strength;Decreased balance;Decreased mobility;Pain;Impaired sensation;Decreased knowledge of use of DME;Decreased activity tolerance;Decreased coordination          PT Treatment Interventions DME instruction;Functional mobility training;Balance training;Wheelchair mobility training;Therapeutic exercise;Therapeutic activities;Patient/family education    PT Goals (Current goals can be found in the Care Plan section)  Acute Rehab PT Goals Patient Stated Goal: To return to independent PT Goal Formulation: With patient/family Time For Goal Achievement: 02/24/17 Potential to Achieve Goals: Fair    Frequency Min 3X/week   Barriers to discharge        Co-evaluation               End of Session Equipment Utilized During Treatment: Gait belt Activity Tolerance: Patient limited by pain Patient left: in bed;with call bell/phone within reach;with bed alarm set;with family/visitor present           Time: [id number] PT Time Calculation (min) (ACUTE ONLY): 27 min   Charges:   PT Evaluation $PT Eval Moderate Complexity: 1 Procedure PT Treatments $Therapeutic Activity: 8-22 mins   PT G CodesReginia Naas 02/09/17, 11:26 AM  Magda Kiel, [place] 02/09/2017

## 2017-01-13 NOTE — Progress Notes (Signed)
Subjective: Patient's main complaint today is pain. She states that that she feels no better. On exam today she is showing full strength with significant give way weakness. Her main focus is on getting pain medications.  Exam: Vitals:   01/13/17 0732 01/13/17 0900  BP: (!) 168/84 (!) 163/98  Pulse:  60  Resp:  20  Temp:  97.8 F (36.6 C)     Gen: In bed, NAD MS: She is alert and oriented follows all commands CN: Cranial nerves II through XII grossly intact Motor: Moving all extremities with 5 out of 5 strength. Patient's lower extremity show 5 out of 5 strength however she has to be distracted and the majority of her weakness is pain related. There is a lot of give way weakness. Sensory: Sensation grossly intact DTR:She has a left knee reflex, but not right knee. No clear spinal level to sensation.   Pertinent Labs/Diagnostics: MRI brain: IMPRESSION: Normal exam.  MRI of cervical spine: IMPRESSION: Chronic degenerative changes of the cervical spine with spondylosis from C3-4 through C6-7. No cord compression or primary cord lesion. Canal narrowing at C5-6 with AP diameter of 8 mm. The subarachnoid space is effaced but the cord is not compressed. Foraminal narrowing at this level could possibly affect either or both C6 nerve roots.  MRI of lumbar spine: IMPRESSION: No change since November 2017.  Transitional anatomy with L5 transitional.  Bilateral facet arthropathy at L4-5 with anterolisthesis of 7 mm. Stenosis of the lateral recesses and foramina right more than left. Neural compression could occur at this level, particularly on the right.  Lateral recess stenosis at L2-3 and L3-4 that could possibly cause neural compression.   MRI of thoracic spine: IMPRESSION: Negative MRI of the thoracic spine.  No stenosis.  No cord lesion  Etta Quill PA-C [facility] [phone]  On my exam, I'm no longer able to give a reflex in her left leg. She does  appear to have some true weakness of bilateral lower extremities. There may be some embellishment, I think that she likely does have some true weakness.  Impression: 66 year old female with progressive lower extremity weakness as well as left upper extremity weakness and paresthesias of all 4 extremities. She also has developed urinary retention. With negative spinal imaging, I think that this is most consistent with Guillain-Barr syndrome. She is undergoing lumbar puncture to look for albuminocytologic dissociation.   Recommendations: 1) continue IVIG 2) LP for cells, protein, glucose 3) neurology will continue to follow  Roland Rack, MD [facility] [phone]  If 7pm- 7am, please page neurology on call as listed in [facility].  01/13/2017, 1:09 PM

## 2017-01-13 NOTE — Progress Notes (Signed)
IV Ig initiated at 0613 per protocol.

## 2017-01-13 NOTE — Consult Note (Signed)
Physical Medicine and Rehabilitation Consult Reason for Consult: Possible GBS Referring Physician: [facility]   HPI: Joanne Morrison is a 66 y.o. right handed female with history of hypertension, depression chronic low back pain. Per chart review patient lives with spouse. One level home for steps to entry. Patient had been independent prior to admission living with spouse who works during the day. Present 01/12/2017 with lower extremity weakness/numbness left greater than right, myalgias, left upper extremity weakness for the past 3-5 days as well as urinary retention. By report recently placed on Zithromax for strep throat. She was seen in the [facility] emergency room 01/11/2017 but was discharged to home before returning with increasing weakness. MRI of the brain and entire spine negative for any significant acute structural issues. No cord compression or primary cord lesions noted. 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 Placed on IVIG 5 doses. Physical therapy evaluation completed 01/13/2017 with recommendations of physical medicine rehabilitation consult. Discussed with Dr. Leonel Ramsay  who notes,CSF protein elevation without elevated WBC  Review of Systems  Constitutional: Negative for chills and fever.  HENT: Negative for hearing loss and tinnitus.   Eyes: Negative for blurred vision and double vision.  Respiratory: Positive for cough and shortness of breath.   Cardiovascular: Negative for chest pain, palpitations and leg swelling.  Gastrointestinal: Positive for constipation. Negative for nausea and vomiting.  Genitourinary:       Urinary retention  Musculoskeletal: Positive for back pain, joint pain, myalgias and neck pain.  Skin: Negative for rash.  Neurological: Positive for tingling and weakness. Negative for seizures and loss of consciousness.  Psychiatric/Behavioral: Positive for depression.       Anxiety  All other  systems reviewed and are negative.  Past Medical History:  Diagnosis Date  . Anxiety   . Arthritis    back  . Chronic cystitis   . Chronic low back pain   . Depression   . Diverticulosis of colon   . Hemorrhoids   . History of colon polyps    09-24-2009  rectal hyperplastic polyp/   and 2016 non-cancerous  . History of endometriosis   . History of panic attacks   . Hyperlipidemia   . Osteopenia   . Pelvic pain   . Sensation of pressure in bladder area   . Wears glasses    Past Surgical History:  Procedure Laterality Date  . BUNIONECTOMY Left 2004 approx  . CARPAL TUNNEL RELEASE Bilateral right 2014;  left 2007  . COLONOSCOPY W/ POLYPECTOMY  09/24/2009  . CYSTO WITH HYDRODISTENSION [name] 10/15/2016   Procedure: CYSTOSCOPY/HYDRODISTENSION AND MARCAINE INSTILLATION;  Surgeon: Rana Snare, MD;  Location: [facility];  Service: Urology;  Laterality: [name];  . CYSTO/  HYDRODISTENTION/  INSTILLATION THERAPY  1997 approx  . HYSTEROSCOPY W/D&C  11/01/2004   POLYPECTOMY  . LAPAROSCOPIC VAGINAL HYSTERECTOMY WITH SALPINGO OOPHORECTOMY Bilateral 04/20/2012  . TUBAL LIGATION  yrs ago   Family History  Problem Relation Age of Onset  . Hypertension Mother   . Dementia Mother   . Lung cancer Mother     smoker  . Osteoporosis Mother   . Heart attack Father 39  . Hypertension Brother   . Diabetes Maternal Aunt   . Cancer Maternal Uncle     RENAL  . Heart attack Maternal Grandmother 86  . Lung cancer Maternal Grandfather     Ecologist  . Pulmonary embolism Brother     ?  etiology  . Anesthesia problems Neg Hx   . Stroke Neg Hx    Social History:  reports that she has never smoked. She has never used smokeless tobacco. She reports that she does not drink alcohol or use drugs. Allergies:  Allergies  Allergen Reactions  . Olanzapine Other (See Comments)    STIFF JOINTS, EXTREMITY WEAKNESS, MEMORY LOSS, LOSS OF BALANCE  . Sumatriptan Other (See Comments)    REACTION:  altered mental status  . Graciella Freer D-S] Other (See Comments)    lethargic   Medications Prior to Admission  Medication Sig Dispense Refill  . benzonatate (TESSALON) 100 MG capsule Take 100 mg by mouth 3 (three) times daily as needed for cough.    Marland Kitchen buPROPion (WELLBUTRIN XL) 150 MG 24 hr tablet Take 150 mg by mouth every morning.    . diazepam (VALIUM) 10 MG tablet Take 10 mg by mouth every 12 (twelve) hours as needed for anxiety.    Marland Kitchen estradiol (VIVELLE-DOT) 0.1 MG/24HR patch Place 1 patch onto the skin 2 (two) times a week.    Marland Kitchen HYDROcodone-acetaminophen (NORCO/VICODIN) 5-325 MG tablet Take 1-2 tablets by mouth every 6 (six) hours as needed for moderate pain.     Marland Kitchen losartan (COZAAR) 25 MG tablet Take 25 mg by mouth daily.    . Promethazine-Phenyleph-Codeine (PROMETHAZINE VC/CODEINE) 6.25-5-10 MG/5ML SYRP Take 5 mLs by mouth daily as needed for cough.    . rizatriptan (MAXALT) 10 MG tablet Take 1 tablet (10 mg total) by mouth as needed. May repeat in 2 hours if needed 10 tablet 1  . zolpidem (AMBIEN) 10 MG tablet Take 10 mg by mouth at bedtime.    Marland Kitchen azithromycin (ZITHROMAX) 250 MG tablet Take 250 mg by mouth daily.    Marland Kitchen levofloxacin (LEVAQUIN) 500 MG tablet Take 500 mg by mouth daily.    . predniSONE (DELTASONE) 10 MG tablet Take 10-40 mg by mouth daily.      Home: Home Living Family/patient expects to be discharged to:: Private residence Living Arrangements: Spouse/significant other Available Help at Discharge: Family, Available 24 hours/day (can take LOA) Type of Home: House Home Access: Stairs to enter [employer] of Steps: 4 Entrance Stairs-Rails: Right, Left Home Layout: One level Bathroom Shower/Tub: Optometrist:  (spouse to measure doorways for w/c accessibility) Home Equipment: Shower seat, Grab bars - tub/shower  Functional History: Prior Function Level of Independence: Independent Functional Status:    Mobility: Bed Mobility Overal bed mobility: Needs Assistance Bed Mobility: Rolling, Sidelying to Sit, Sit to Supine Rolling: Min assist Sidelying to sit: Mod assist, HOB elevated Sit to supine: Mod assist General bed mobility comments: assist for legs off bed and trunk upright to sit, assist for legs into bed to supine Transfers Overall transfer level: Needs assistance Equipment used: None Transfers: Squat Pivot Transfers Squat pivot transfers: Max assist General transfer comment: lifting assist to scoot pivot along EOB toward [facility] Ambulation/Gait General Gait Details: unable    ADL:    Cognition: Cognition Overall Cognitive Status: Within Functional Limits for tasks assessed Orientation Level: Oriented X4 Cognition Arousal/Alertness: Awake/alert Behavior During Therapy: Anxious Overall Cognitive Status: Within Functional Limits for tasks assessed  Blood pressure (!) 163/98, pulse 60, temperature 97.8 F (36.6 C), temperature source Axillary, resp. rate 20, height 5\' 2"  (1.575 m), weight 71.5 kg (157 lb 10.1 oz), SpO2 100 %. Physical Exam  Vitals reviewed. Constitutional: She is oriented to person, place, and time.  HENT:  Head:  Normocephalic.  Eyes: EOM are normal.  Neck: Normal range of motion. Neck supple. No thyromegaly present.  Cardiovascular: Normal rate, regular rhythm and normal heart sounds.   Respiratory: Effort normal and breath sounds normal. No respiratory distress. She has no wheezes.  GI: Soft. Bowel sounds are normal. She exhibits no distension.  Neurological: She is alert and oriented to person, place, and time.  Skin: Skin is warm and dry.  3 minus, right deltoid, bicep, tricep, 4 minus grip, 2 minus, left deltoid, bicep, tricep, 4 at the grip 3 minus bilateral hip flexion, 3 plus bilateral knee extension to minus bilateral ankle dorsiflexion, plantarflexion. Sensation is absent to light touch below the ankle bilaterally. Intact in the upper limbs and  the proximal lower limbs. Decreased tone noted. Bilateral upper and lower extremities  Results for orders placed or performed during the hospital encounter of 01/12/17 (from the past 24 hour(s))  Comprehensive metabolic panel     Status: Abnormal   Collection Time: 01/12/17 12:04 PM  Result Value Ref Range   Sodium 139 135 - 145 mmol/L   Potassium 2.8 (L) 3.5 - 5.1 mmol/L   Chloride 107 101 - 111 mmol/L   CO2 20 (L) 22 - 32 mmol/L   Glucose, Bld 96 65 - 99 mg/dL   BUN 11 6 - 20 mg/dL   Creatinine, Ser 1.14 (H) 0.44 - 1.00 mg/dL   Calcium 9.1 8.9 - 10.3 mg/dL   Total Protein 6.7 6.5 - 8.1 g/dL   Albumin 4.0 3.5 - 5.0 g/dL   AST 28 15 - 41 U/L   ALT 18 14 - 54 U/L   Alkaline Phosphatase 46 38 - 126 U/L   Total Bilirubin 0.9 0.3 - 1.2 mg/dL   GFR calc non Af Amer 50 (L) >60 mL/min   GFR calc Af Amer 58 (L) >60 mL/min   Anion gap 12 5 - 15  CBC with Differential     Status: Abnormal   Collection Time: 01/12/17 12:04 PM  Result Value Ref Range   WBC 11.5 (H) 4.0 - 10.5 K/uL   RBC 5.34 (H) 3.87 - 5.11 MIL/uL   Hemoglobin 14.2 12.0 - 15.0 g/dL   HCT 42.9 36.0 - 46.0 %   MCV 80.3 78.0 - 100.0 fL   MCH 26.6 26.0 - 34.0 pg   MCHC 33.1 30.0 - 36.0 g/dL   RDW 13.6 11.5 - 15.5 %   Platelets 289 150 - 400 K/uL   Neutrophils Relative % 57 %   Neutro Abs 6.6 1.7 - 7.7 K/uL   Lymphocytes Relative 36 %   Lymphs Abs 4.1 (H) 0.7 - 4.0 K/uL   Monocytes Relative 6 %   Monocytes Absolute 0.7 0.1 - 1.0 K/uL   Eosinophils Relative 1 %   Eosinophils Absolute 0.1 0.0 - 0.7 K/uL   Basophils Relative 0 %   Basophils Absolute 0.0 0.0 - 0.1 K/uL  I-Stat CG4 Lactic Acid, ED     Status: Abnormal   Collection Time: 01/12/17 12:25 PM  Result Value Ref Range   Lactic Acid, Venous 2.22 (HH) 0.5 - 1.9 mmol/L   Comment NOTIFIED PHYSICIAN   TSH     Status: None   Collection Time: 01/12/17 10:15 PM  Result Value Ref Range   TSH 1.645 0.350 - 4.500 uIU/mL  CK     Status: Abnormal   Collection Time:  01/12/17 10:15 PM  Result Value Ref Range   Total CK 280 (H) 38 - 234 U/L  Vitamin B12     Status: None   Collection Time: 01/12/17 10:15 PM  Result Value Ref Range   Vitamin B-12 726 180 - 914 pg/mL  Basic metabolic panel     Status: Abnormal   Collection Time: 01/13/17  3:28 AM  Result Value Ref Range   Sodium 137 135 - 145 mmol/L   Potassium 3.3 (L) 3.5 - 5.1 mmol/L   Chloride 103 101 - 111 mmol/L   CO2 19 (L) 22 - 32 mmol/L   Glucose, Bld 110 (H) 65 - 99 mg/dL   BUN 8 6 - 20 mg/dL   Creatinine, Ser 0.95 0.44 - 1.00 mg/dL   Calcium 9.3 8.9 - 10.3 mg/dL   GFR calc non Af Amer >60 >60 mL/min   GFR calc Af Amer >60 >60 mL/min   Anion gap 15 5 - 15  CBC     Status: Abnormal   Collection Time: 01/13/17  3:28 AM  Result Value Ref Range   WBC 11.9 (H) 4.0 - 10.5 K/uL   RBC 5.36 (H) 3.87 - 5.11 MIL/uL   Hemoglobin 14.2 12.0 - 15.0 g/dL   HCT 42.2 36.0 - 46.0 %   MCV 78.7 78.0 - 100.0 fL   MCH 26.5 26.0 - 34.0 pg   MCHC 33.6 30.0 - 36.0 g/dL   RDW 13.6 11.5 - 15.5 %   Platelets 355 150 - 400 K/uL   Mr Jeri Cos Wo Contrast  Result Date: 01/12/2017 CLINICAL DATA:  Bilateral lower extremity weakness and paresthesia EXAM: MRI HEAD WITHOUT AND WITH CONTRAST TECHNIQUE: Multiplanar, multiecho pulse sequences of the brain and surrounding structures were obtained without and with intravenous contrast. CONTRAST:  44mL MULTIHANCE GADOBENATE DIMEGLUMINE 529 MG/ML IV SOLN COMPARISON:  None. FINDINGS: Brain: The brain has normal appearance without evidence of malformation, atrophy, old or acute small or large vessel infarction, hemorrhage, hydrocephalus or extra-axial collection. No pituitary abnormality. No abnormal enhancement occurs. Vascular: Major vessels at the base of the brain show flow. Skull and upper cervical spine: Normal Sinuses/Orbits: Clear/ normal. Other: None significant. IMPRESSION: Normal exam. Electronically Signed   By: Nelson Chimes M.D.   On: 01/12/2017 16:37   Mr Cervical  Spine W Wo Contrast  Result Date: 01/12/2017 CLINICAL DATA:  Lower extremity abnormal sensation in weakness beginning 3 days ago which is worsening. EXAM: MRI CERVICAL SPINE WITHOUT AND WITH CONTRAST TECHNIQUE: Multiplanar and multiecho pulse sequences of the cervical spine, to include the craniocervical junction and cervicothoracic junction, were obtained without and with intravenous contrast. CONTRAST:  63mL MULTIHANCE GADOBENATE DIMEGLUMINE 529 MG/ML IV SOLN COMPARISON:  None. FINDINGS: Study Sever's from motion degradation. Alignment: Straightening of the normal cervical lordosis. Vertebrae: No fracture or primary bone lesion. Some discogenic endplate edema at [id number] could contribute to neck pain. Cord: No cord compression or primary cord lesion. Posterior Fossa, vertebral arteries, paraspinal tissues: Negative Disc levels: Foramen magnum, C1-2 and C2-3: Unremarkable. Facet arthropathy at C2-3 right more than left but without stenosis. C3-4: Mild bulging of the disc. No central canal stenosis. Facet arthropathy right more than left. No neural compression. C4-5: Endplate osteophytes. No compressive narrowing of the canal or foramina. C5-6: Spondylosis with endplate osteophytes that efface the ventral subarachnoid space. AP diameter of the canal is 8 mm. No actual cord compression. Mild foraminal stenosis bilaterally. C6-7: Spondylosis with endplate osteophytes and bulging of the disc. Narrowing of the ventral subarachnoid space but no cord compression. Foramina sufficiently patent. C7-T1:  Mild bulging of the disc.  No neural compression. IMPRESSION: Chronic degenerative changes of the cervical spine with spondylosis from C3-4 through C6-7. No cord compression or primary cord lesion. Canal narrowing at C5-6 with AP diameter of 8 mm. The subarachnoid space is effaced but the cord is not compressed. Foraminal narrowing at this level could possibly affect either or both C6 nerve roots. Electronically Signed   By:  Nelson Chimes M.D.   On: 01/12/2017 16:27   Mr Thoracic Spine W Wo Contrast  Result Date: 01/12/2017 CLINICAL DATA:  bilateral lower extremity weakness and paresthesia EXAM: MRI THORACIC SPINE WITH and without  CONTRAST 15 cc MultiHance TECHNIQUE: Multiplanar, multisequence MR imaging of the thoracic spine was performed following the administration of intravenous contrast. COMPARISON:  None. FINDINGS: Alignment:  Mild thoracic curvature convex to the left. Vertebrae: No fracture or primary bone lesion in the thoracic region. Cord:  No cord compression or primary cord lesion. Paraspinal and other soft tissues: Negative Disc levels: No degenerative disc disease in the thoracic region. No bulge or herniation. No canal or foraminal stenosis. IMPRESSION: Negative MRI of the thoracic spine.  No stenosis.  No cord lesion. Electronically Signed   By: Nelson Chimes M.D.   On: 01/12/2017 16:34   Mr Lumbar Spine W Wo Contrast  Result Date: 01/12/2017 CLINICAL DATA:  Lower extremity decreased sensation an weakness. EXAM: MRI LUMBAR SPINE WITHOUT AND WITH CONTRAST TECHNIQUE: Multiplanar and multiecho pulse sequences of the lumbar spine were obtained without and with intravenous contrast. CONTRAST:  72mL MULTIHANCE GADOBENATE DIMEGLUMINE 529 MG/ML IV SOLN COMPARISON:  10/18/2016 FINDINGS: Segmentation:  L5 is transitional, as described previously. Alignment:  Curvature convex to the right with the apex at L1. Vertebrae:  No fracture or primary bone lesion. Conus medullaris: Extends to the L1 level and appears normal. Paraspinal and other soft tissues: Negative Disc levels: L1-2: Mild bulging of the disc.  No stenosis or neural compression. L2-3: Moderate bulging of the disc. Facet and ligamentous hypertrophy. Mild multifactorial stenosis. Some potential for neural compression in the lateral recesses, right more than left. L3-4: Endplate osteophytes and bulging of the disc. Facet and ligamentous hypertrophy. Narrowing of the  lateral recesses that could cause neural compression, right more than left. L4-5: Bilateral facet arthropathy with anterolisthesis of 7 mm. Bulging of the disc. Narrowing of the lateral recesses and neural foramina right more than left. Neural compression could occur at this level, particularly on the right. L5-S1: Transitional and unremarkable. No apparent change since November. IMPRESSION: No change since November 2017. Transitional anatomy with L5 transitional. Bilateral facet arthropathy at L4-5 with anterolisthesis of 7 mm. Stenosis of the lateral recesses and foramina right more than left. Neural compression could occur at this level, particularly on the right. Lateral recess stenosis at L2-3 and L3-4 that could possibly cause neural compression. Electronically Signed   By: Nelson Chimes M.D.   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 Does the need for close, 24 hr/day medical supervision in concert with the patient's rehab needs make it unreasonable for this patient to be served in a less intensive setting? Yes 2. Co-Morbidities requiring supervision/potential complications: Lumbar spondylolisthesis, cervical stenosis 3. Due to bladder management, bowel management, safety, skin/wound care, disease management, medication administration, pain management and patient education, does the patient require 24 hr/day rehab nursing? Yes 4. Does the patient require coordinated care of a physician, rehab nurse, PT (1-2 hrs/day, 5 days/week), OT (1-2 hrs/day, 5 days/week) and SLP (.5-1  hrs/day, 3 days/week) to address physical and functional deficits in the context of the above medical diagnosis(es)? Yes Addressing deficits in the following areas: balance, endurance, locomotion, strength, transferring, bowel/bladder control, bathing, dressing, feeding, grooming, toileting and psychosocial support 5. Can the patient actively participate in an  intensive therapy program of at least 3 hrs of therapy per day at least 5 days per week? Yes 6. The potential for patient to make measurable gains while on inpatient rehab is good 7. Anticipated functional outcomes upon discharge from inpatient rehab are min assist  with PT, min assist with OT, modified independent and supervision with SLP. 8. Estimated rehab length of stay to reach the above functional goals is: 20-23d 9. Does the patient have adequate social supports and living environment to accommodate these discharge functional goals? Yes 10. Anticipated D/C setting: Home 11. Anticipated post D/C treatments: [facility] 12. Overall Rehab/Functional Prognosis: good  RECOMMENDATIONS: This patient's condition is appropriate for continued rehabilitative care in the following setting: CIR Patient has agreed to participate in recommended program. Yes Note that insurance prior authorization may be required for reimbursement for recommended care.  Comment:    Cathlyn Parsons., PA-C 01/13/2017

## 2017-01-13 NOTE — Progress Notes (Signed)
She just got back from having a spinal tap and did not want to do the VC and NIF because of the pain

## 2017-01-13 NOTE — Progress Notes (Signed)
Patient left [facility] after initial Neurology consult and MRI scanning. Returned to [facility] later in the day. Imaging studies reviewed. Significant spondylosis on MRI of cervical and lumbar spine, but no cord or cauda equina compression to explain her symptoms of urinary retention and ascending numbness with progressive lower extremity weakness.  Although not described on official report, there may be enhancement of the exiting nerve roots at several lumbar levels, which would be compatible with GBS. Per Dr. Cecil Cobbs consult note, the patient had areflexia of the lower extremities. Has not had LP.   Based upon the above, benefits of starting with IVIG or plasma exchange for possible GBS may outweigh risks of proceeding without further diagnostic information from LP, given that her sensory symptoms are now involving her hands. Would also obtain respiratory consult to assess NIF and FVC q4h. After starting 5 day course of IVIG or PLEX, LP will still be useful to confirm GBS, although a negative result (normal protein) would not necessarily militate in favor of stopping treatment.   Electronically signed: Dr. Kerney Elbe

## 2017-01-13 NOTE — Progress Notes (Signed)
Patient returned from procedure. Patient alert and oriented x 4. Patient made comfortable.

## 2017-01-14 DIAGNOSIS — B999 Unspecified infectious disease: Secondary | ICD-10-CM | POA: Diagnosis not present

## 2017-01-14 DIAGNOSIS — G8250 Quadriplegia, unspecified: Secondary | ICD-10-CM

## 2017-01-14 DIAGNOSIS — M5440 Lumbago with sciatica, unspecified side: Secondary | ICD-10-CM | POA: Diagnosis not present

## 2017-01-14 DIAGNOSIS — G8929 Other chronic pain: Secondary | ICD-10-CM | POA: Diagnosis not present

## 2017-01-14 DIAGNOSIS — R531 Weakness: Secondary | ICD-10-CM | POA: Diagnosis not present

## 2017-01-14 DIAGNOSIS — G63 Polyneuropathy in diseases classified elsewhere: Secondary | ICD-10-CM | POA: Diagnosis not present

## 2017-01-14 DIAGNOSIS — G610 Guillain-Barre syndrome: Secondary | ICD-10-CM | POA: Diagnosis not present

## 2017-01-14 MED ORDER — HYDRALAZINE HCL 20 MG/ML IJ SOLN: 10.0000 mg | Freq: Four times a day (QID) | INTRAMUSCULAR | Status: DC | PRN

## 2017-01-14 MED ORDER — FLUCONAZOLE 100 MG PO TABS
200.0000 mg | ORAL_TABLET | Freq: Every day | ORAL | Status: DC
  Administered 2017-01-14 – 2017-01-15 (×2): 200 mg via ORAL
  Filled 2017-01-14 (×2): qty 2

## 2017-01-14 MED ORDER — LOSARTAN POTASSIUM 50 MG PO TABS
25.0000 mg | ORAL_TABLET | Freq: Every day | ORAL | Status: DC
  Administered 2017-01-14 – 2017-01-15 (×2): 25 mg via ORAL
  Filled 2017-01-14 (×2): qty 1

## 2017-01-14 MED ORDER — GABAPENTIN 400 MG PO CAPS
400.0000 mg | ORAL_CAPSULE | Freq: Three times a day (TID) | ORAL | Status: DC
  Administered 2017-01-14 – 2017-01-15 (×3): 400 mg via ORAL
  Filled 2017-01-14 (×4): qty 1

## 2017-01-14 NOTE — Progress Notes (Signed)
Subjective: feels that she is getting weaker.  Exam: Vitals:   01/14/17 0842 01/14/17 0908  BP: (!) 181/98 (!) 162/95  Pulse: 76 86  Resp: 18 18  Temp: 98.3 F (36.8 C) 98.3 F (36.8 C)   Gen: In bed, NAD Resp: non-labored breathing, no acute distress Abd: soft, nt  Neuro: MS: Awake, alert, interactive and appropriate CN: Extra ocular movements intact Motor: She has more symmetric weakness today with bilateral distal greater than proximal weakness in the ankles with dorsiflexion weaker than plantar flexion. She does have slightly more weakness in the left arm than right arm Sensory: Decreased to temperature to just above the wrists and to about the knees. DTR: She has absent reflexes in the lower extremities, and the reflexes in her upper extremity is are more difficult to obtain, though I was still able to elicit 1+ reflexes bilaterally at the elbows.  Pertinent Labs: On CSF: WBC 3 Protein 79  Impression: 66 year old female with progressive weakness and numbness and decreased reflexes in the setting of albuminocytologic dissociation. By far, this is most consistent with Barr syndrome and she is currently receiving treatment for this with IVIG, status post a single treatment. I typically start to see improvement after about the third dose of IVIG.  Recommendations: 1) continue IVIG, today is day 2 2) PT, OT 3) NIF every 6 hours 4)  neurology will continue to follow  Roland Rack, MD [facility] [phone]  If 7pm- 7am, please page neurology on call as listed in [place].

## 2017-01-14 NOTE — Progress Notes (Signed)
IV:[id number], 01/14/2017  RT attempted to do NIF with pt and pt said she didn't feel well after therapy and refused.  RT will attempt again.

## 2017-01-14 NOTE — Progress Notes (Addendum)
I met with pt's spouse at bedside for pt sleeping soundly. We discussed the possibility of an inpt rehab admission here at [facility] and the intensity of therapy required. He is quite overwhelmed and unable to discuss his preference at this time, depending on her progress medically. I did discuss SNF rehab and other acute inpt rehab centers in the area are also available. I will follow her progress.[phone] . I am off Thursday and Karene Fry will follow in my absence. She can be reached at [phone]

## 2017-01-14 NOTE — Progress Notes (Signed)
PROGRESS NOTE        PATIENT DETAILS Name: Joanne Morrison Age: 66 y.o. Sex: female Date of Birth: [birth year] Admit Date: 01/12/2017 Admitting Physician Evalee Mutton Kristeen Mans, MD [id number] A, MD  Brief Narrative: Patient is a 66 y.o. female with PMH significant for chronic low back pain, anxiety, depression, and HTN who presented to the ED on 1/21 with lower extremity weakness, paresthesias, myalgias. MRI of the brain and entire spine was negative for acute abnormalities, she was subsequently thought to have Guillain-Barr syndrome and started on empiric plasmapheresis. She underwent lumbar puncture 1/30 that showed elevated protein levels. See below for further details  Subjective: Patient appears in normal mood this morning accompanied by husband. Reports "weird copper taste" in her mouth. Continues to have lower extremity weakness. Pain is better controlled today compared to yesterday  Assessment/Plan: Guillain Barr syndrome: Did have preceding respiratory illness and was diagnosed with strep throat.Lumbar puncture on 1/30 showed albuminocytogenic dissociation. Her neurological deficits are essentially unchanged, she has significant amounts of anxiety contributing as well. Plans are to continue IVIG. Lumbar puncture on 1/30 showed albuminocytogenic dissociation. Neurology continues to follow. Continue close monitoring with regular VF and NIF checks.  Paresthesias/neuropathic pain: Continue Neurontin-increased dosage to 400 mg 3 times a day. Follow-if no response, can add Cymbalta.   Oral Candidiasis (Thrush): Due to prior outpatient prednisone and antibiotic use. Started on fluconazole.    Essential HTN: Uncontrolled, resumed losartan. Follow.   Depression: Continue bupropion.   Chronic low back pain: No acute lesions see on spinal MRI - continue narcotics prn.   Hypokalemia: K+ approaching normal limit today at 3.3. Continue to monitor progression with  BMP tomorrow.     DVT Prophylaxis: SCD  Code Status: Full code  Family Communication: Spouse at bedside  Disposition Plan: Remain inpatient  Antimicrobial agents: Anti-infectives    Start     Dose/Rate Route Frequency Ordered Stop   01/14/17 1000  fluconazole (DIFLUCAN) tablet 200 mg     200 mg Oral Daily 01/14/17 0926        Procedures: None at this time  CONSULTS: Neuro  Time spent: 30 minutes-Greater than 50% of this time was spent in counseling, explanation of diagnosis, planning of further management, and coordination of care.  MEDICATIONS: Scheduled Meds: . aspirin EC  325 mg Oral Daily  . buPROPion  150 mg Oral q morning - 10a  . dextrose   Intravenous See admin instructions  . fluconazole  200 mg Oral Daily  . gabapentin  400 mg Oral TID  . Immune Globulin 10%  400 mg/kg Intravenous Q24 Hr x 5  . lidocaine  1 patch Transdermal Q24H  . losartan  25 mg Oral Daily  . omega-3 acid ethyl esters  1 g Oral Daily  . sodium chloride flush  3 mL Intravenous Q12H  . vitamin C  1,000 mg Oral Daily  . zolpidem  5 mg Oral QHS   Continuous Infusions: PRN Meds:.acetaminophen **OR** acetaminophen, albuterol, diazepam, hydrALAZINE, HYDROcodone-acetaminophen, LORazepam, morphine injection, ondansetron **OR** ondansetron (ZOFRAN) IV   PHYSICAL EXAM: Vital signs: Vitals:   01/14/17 0645 01/14/17 0700 01/14/17 0842 01/14/17 0908  BP: (!) 173/92 (!) 167/83 (!) 181/98 (!) 162/95  Pulse: 75 68 76 86  Resp: 16 18 18 18   Temp: 98 F (36.7 C) 97.6 F (36.4 C) 98.3 F (36.8 C) 98.3  F (36.8 C)  TempSrc: Oral Oral Axillary Axillary  SpO2:   98% 98%  Weight:      Height:       Filed Weights   01/12/17 0803 01/12/17 2126  Weight: 69.4 kg (153 lb) 71.5 kg (157 lb 10.1 oz)   Body mass index is 28.83 kg/m.   General appearance: Awake, alert, not in any distress. Speech Clear. Not toxic Looking Eyes: PERRLA, no scleral icterus.Pink conjunctiva HEENT: Atraumatic and  Normocephalic. Tongue coated in white thrush on inspection.  Neck: Supple, no JVD. No cervical lymphadenopathy. No thyromegaly Resp:Good air entry bilaterally, no added sounds  CVS: S1 S2 regular, no murmurs.  GI: Bowel sounds present, Non tender and not distended with no gaurding, rigidity or rebound.No organomegaly Extremities: B/L Lower Ext shows no edema, both legs are warm to touch Neurology:  Speech clear,Non focal, sensation is grossly intact. Not able to elicit DTR B/L on Le.  Psychiatric: Normal judgment and insight. Alert and oriented x 3. Normal mood. Musculoskeletal:No digital cyanosis.    Strength: B/L LE strength 4/5, slight left upper ext weakness-around 4/5 Skin:No Rash, warm and dry  I have personally reviewed following labs and imaging studies  LABORATORY DATA: CBC:  Recent Labs Lab 01/11/17 2226 01/12/17 1204 01/13/17 0328  WBC 10.0 11.5* 11.9*  NEUTROABS  --  6.6  --   HGB 13.9 14.2 14.2  HCT 42.2 42.9 42.2  MCV 80.2 80.3 78.7  PLT 311 289 [id number]    Basic Metabolic Panel:  Recent Labs Lab 01/11/17 2226 01/12/17 1204 01/13/17 0328  NA 137 139 137  K 3.9 2.8* 3.3*  CL 105 107 103  CO2 23 20* 19*  GLUCOSE 129* 96 110*  BUN 18 11 8   CREATININE 1.65* 1.14* 0.95  CALCIUM 9.1 9.1 9.3    GFR: Estimated Creatinine Clearance: 55.4 mL/min (by C-G formula based on SCr of 0.95 mg/dL).  Liver Function Tests:  Recent Labs Lab 01/12/17 1204  AST 28  ALT 18  ALKPHOS 46  BILITOT 0.9  PROT 6.7  ALBUMIN 4.0   No results for input(s): LIPASE, AMYLASE in the last 168 hours. No results for input(s): AMMONIA in the last 168 hours.  Coagulation Profile: No results for input(s): INR, PROTIME in the last 168 hours.  Cardiac Enzymes:  Recent Labs Lab 01/11/17 2226 01/12/17 2215  CKTOTAL 123 280*    BNP (last 3 results) No results for input(s): PROBNP in the last 8760 hours.  HbA1C: No results for input(s): HGBA1C in the last 72 hours.  CBG: No  results for input(s): GLUCAP in the last 168 hours.  Lipid Profile: No results for input(s): CHOL, HDL, LDLCALC, TRIG, CHOLHDL, LDLDIRECT in the last 72 hours.  Thyroid Function Tests:  Recent Labs  01/12/17 2215  TSH 1.645    Anemia Panel:  Recent Labs  01/12/17 2215  VITAMINB12 726    Urine analysis:    Component Value Date/Time   COLORURINE STRAW (A) 01/11/2017 2337   APPEARANCEUR CLEAR 01/11/2017 2337   LABSPEC 1.008 01/11/2017 2337   PHURINE 9.0 (H) 01/11/2017 2337   GLUCOSEU NEGATIVE 01/11/2017 2337   GLUCOSEU NEGATIVE 12/29/2006 0738   HGBUR NEGATIVE 01/11/2017 2337   HGBUR negative 12/05/2009 0933   BILIRUBINUR NEGATIVE 01/11/2017 2337   BILIRUBINUR neg 07/08/2012 1218   KETONESUR 5 (A) 01/11/2017 2337   PROTEINUR NEGATIVE 01/11/2017 2337   UROBILINOGEN 0.2 07/08/2012 1218   UROBILINOGEN 0.2 12/05/2009 0933   NITRITE NEGATIVE 01/11/2017 2337  LEUKOCYTESUR NEGATIVE 01/11/2017 2337    Sepsis Labs: Lactic Acid, Venous    Component Value Date/Time   LATICACIDVEN 2.22 (HH) 01/12/2017 1225    MICROBIOLOGY: Recent Results (from the past 240 hour(s))  Culture, blood (routine x 2)     Status: None (Preliminary result)   Collection Time: 01/12/17 12:00 PM  Result Value Ref Range Status   Specimen Description BLOOD RIGHT ANTECUBITAL  Final   Special Requests BOTTLES DRAWN AEROBIC AND ANAEROBIC  5CC  Final   Culture NO GROWTH 1 DAY  Final   Report Status PENDING  Incomplete  Culture, blood (routine x 2)     Status: None (Preliminary result)   Collection Time: 01/12/17 12:15 PM  Result Value Ref Range Status   Specimen Description BLOOD RIGHT HAND  Final   Special Requests IN PEDIATRIC BOTTLE  3CC  Final   Culture NO GROWTH 1 DAY  Final   Report Status PENDING  Incomplete  CSF culture     Status: None (Preliminary result)   Collection Time: 01/13/17  4:00 PM  Result Value Ref Range Status   Specimen Description CSF  Final   Special Requests NONE  Final    Gram Stain CYTOSPIN SMEAR NO WBC SEEN NO ORGANISMS SEEN   Final   Culture PENDING  Incomplete   Report Status PENDING  Incomplete    RADIOLOGY STUDIES/RESULTS: Mr Jeri Cos Wo Contrast  Result Date: 01/12/2017 CLINICAL DATA:  Bilateral lower extremity weakness and paresthesia EXAM: MRI HEAD WITHOUT AND WITH CONTRAST TECHNIQUE: Multiplanar, multiecho pulse sequences of the brain and surrounding structures were obtained without and with intravenous contrast. CONTRAST:  44mL MULTIHANCE GADOBENATE DIMEGLUMINE 529 MG/ML IV SOLN COMPARISON:  None. FINDINGS: Brain: The brain has normal appearance without evidence of malformation, atrophy, old or acute small or large vessel infarction, hemorrhage, hydrocephalus or extra-axial collection. No pituitary abnormality. No abnormal enhancement occurs. Vascular: Major vessels at the base of the brain show flow. Skull and upper cervical spine: Normal Sinuses/Orbits: Clear/ normal. Other: None significant. IMPRESSION: Normal exam. Electronically Signed   By: Nelson Chimes M.D.   On: 01/12/2017 16:37   Mr Cervical Spine W Wo Contrast  Result Date: 01/12/2017 CLINICAL DATA:  Lower extremity abnormal sensation in weakness beginning 3 days ago which is worsening. EXAM: MRI CERVICAL SPINE WITHOUT AND WITH CONTRAST TECHNIQUE: Multiplanar and multiecho pulse sequences of the cervical spine, to include the craniocervical junction and cervicothoracic junction, were obtained without and with intravenous contrast. CONTRAST:  52mL MULTIHANCE GADOBENATE DIMEGLUMINE 529 MG/ML IV SOLN COMPARISON:  None. FINDINGS: Study Sever's from motion degradation. Alignment: Straightening of the normal cervical lordosis. Vertebrae: No fracture or primary bone lesion. Some discogenic endplate edema at [id number] could contribute to neck pain. Cord: No cord compression or primary cord lesion. Posterior Fossa, vertebral arteries, paraspinal tissues: Negative Disc levels: Foramen magnum, C1-2 and C2-3:  Unremarkable. Facet arthropathy at C2-3 right more than left but without stenosis. C3-4: Mild bulging of the disc. No central canal stenosis. Facet arthropathy right more than left. No neural compression. C4-5: Endplate osteophytes. No compressive narrowing of the canal or foramina. C5-6: Spondylosis with endplate osteophytes that efface the ventral subarachnoid space. AP diameter of the canal is 8 mm. No actual cord compression. Mild foraminal stenosis bilaterally. C6-7: Spondylosis with endplate osteophytes and bulging of the disc. Narrowing of the ventral subarachnoid space but no cord compression. Foramina sufficiently patent. C7-T1:  Mild bulging of the disc.  No neural compression. IMPRESSION: Chronic degenerative changes  of the cervical spine with spondylosis from C3-4 through C6-7. No cord compression or primary cord lesion. Canal narrowing at C5-6 with AP diameter of 8 mm. The subarachnoid space is effaced but the cord is not compressed. Foraminal narrowing at this level could possibly affect either or both C6 nerve roots. Electronically Signed   By: Nelson Chimes M.D.   On: 01/12/2017 16:27   Mr Thoracic Spine W Wo Contrast  Result Date: 01/12/2017 CLINICAL DATA:  bilateral lower extremity weakness and paresthesia EXAM: MRI THORACIC SPINE WITH and without  CONTRAST 15 cc MultiHance TECHNIQUE: Multiplanar, multisequence MR imaging of the thoracic spine was performed following the administration of intravenous contrast. COMPARISON:  None. FINDINGS: Alignment:  Mild thoracic curvature convex to the left. Vertebrae: No fracture or primary bone lesion in the thoracic region. Cord:  No cord compression or primary cord lesion. Paraspinal and other soft tissues: Negative Disc levels: No degenerative disc disease in the thoracic region. No bulge or herniation. No canal or foraminal stenosis. IMPRESSION: Negative MRI of the thoracic spine.  No stenosis.  No cord lesion. Electronically Signed   By: Nelson Chimes  M.D.   On: 01/12/2017 16:34   Mr Lumbar Spine W Wo Contrast  Result Date: 01/12/2017 CLINICAL DATA:  Lower extremity decreased sensation an weakness. EXAM: MRI LUMBAR SPINE WITHOUT AND WITH CONTRAST TECHNIQUE: Multiplanar and multiecho pulse sequences of the lumbar spine were obtained without and with intravenous contrast. CONTRAST:  77mL MULTIHANCE GADOBENATE DIMEGLUMINE 529 MG/ML IV SOLN COMPARISON:  10/18/2016 FINDINGS: Segmentation:  L5 is transitional, as described previously. Alignment:  Curvature convex to the right with the apex at L1. Vertebrae:  No fracture or primary bone lesion. Conus medullaris: Extends to the L1 level and appears normal. Paraspinal and other soft tissues: Negative Disc levels: L1-2: Mild bulging of the disc.  No stenosis or neural compression. L2-3: Moderate bulging of the disc. Facet and ligamentous hypertrophy. Mild multifactorial stenosis. Some potential for neural compression in the lateral recesses, right more than left. L3-4: Endplate osteophytes and bulging of the disc. Facet and ligamentous hypertrophy. Narrowing of the lateral recesses that could cause neural compression, right more than left. L4-5: Bilateral facet arthropathy with anterolisthesis of 7 mm. Bulging of the disc. Narrowing of the lateral recesses and neural foramina right more than left. Neural compression could occur at this level, particularly on the right. L5-S1: Transitional and unremarkable. No apparent change since November. IMPRESSION: No change since November 2017. Transitional anatomy with L5 transitional. Bilateral facet arthropathy at L4-5 with anterolisthesis of 7 mm. Stenosis of the lateral recesses and foramina right more than left. Neural compression could occur at this level, particularly on the right. Lateral recess stenosis at L2-3 and L3-4 that could possibly cause neural compression. Electronically Signed   By: Nelson Chimes M.D.   On: 01/12/2017 16:32   Dg Fluoro Guide Lumbar  Puncture  Result Date: 01/13/2017 CLINICAL DATA:  Sudden onset of inability to walk. Possible Guillain-Barre syndrome. EXAM: DIAGNOSTIC LUMBAR PUNCTURE UNDER FLUOROSCOPIC GUIDANCE FLUOROSCOPY TIME:  Fluoroscopy Time:  0 minutes and 42 seconds Radiation Exposure Index (if provided by the fluoroscopic device): 4.1 mGy Number of Acquired Spot Images: 0 PROCEDURE: Informed consent was obtained from the patient prior to the procedure, including potential complications of headache, allergy, and pain. With the patient prone, the lower back was prepped with Betadine. 1% Lidocaine was used for local anesthesia. Lumbar puncture was performed at the L3-4 level using a 20 gauge needle with return  of clear CSF. 8.5 Ml of CSF were obtained for laboratory studies. The patient tolerated the procedure well and there were no apparent complications. IMPRESSION: Fluoroscopic guided lumbar puncture with 8.5 cc clear CSF obtained for appropriate laboratory evaluation. Electronically Signed   By: Marijo Sanes M.D.   On: 01/13/2017 16:49     LOS: 1 day   Rose Clousing, PAS  [employer]   If 7PM-7AM, please contact night-coverage [email or web address] Password [facility] 01/14/2017, 11:27 AM  Attending MD note  Patient was seen, examined,treatment plan was discussed with the PA-S.  I have personally reviewed the clinical findings, lab, imaging studies and management of this patient in detail. I agree with the documentation, as recorded by the PA-S.   Patient's pain seems to be better controlled today. Seems very anxious  On Exam: Gen. exam: Awake, alert, not in any distress Chest: Good air entry bilaterally, no rhonchi or rales CVS: S1-S2 regular, no murmurs Abdomen: Soft, nontender and nondistended Neurology: Lower extremity weakness approximately 4/5, left upper extremity weakness approximately 4/5 Skin: No rash or lesions  Impression: Guillain-Barr syndrome Paresthesia/neuropathic pain likely secondary to  above Uncontrolled hypertension  Plan: Continue IVIG Increase Neurontin to 400 mg 3 times a day Resume losartan Continue PT Neurology following  Rest as above  [facility]

## 2017-01-14 NOTE — Progress Notes (Signed)
1405, 01/14/2017   RT attempted for the 3rd time to do NIF/VC with pt and currently pt is talking with another discipline group.  RT will attempt to come back later this afternoon.

## 2017-01-14 NOTE — Evaluation (Addendum)
Occupational Therapy Evaluation Patient Details Name: Joanne Morrison MRN: [id number] DOB: September 20, 1951 Today's Date: 01/14/2017    History of Present Illness Patient is a 66 y.o. female with medical history significant of chronic low back pain, anxiety, depression, hypertension-recently had sorethroat (+strep) presented to the ED on 1/29 with lower ext weakness,tingling numbess that started in the lower extremities and subsequently started involving her upper extremities. Unable to elicit DTR in the lower ext. MRI of the entire spine negative. Concern for GBS-started on empiric IVIG by neurology last night.   Clinical Impression   PTA, pt was independent with ADL and functional mobility. Pt currently demonstrates significant functional decline requiring max assist with ADL and max assist +2 for toilet transfers. She presents with significantly decreased functional use of B UE with the L being greater impaired than the R (see below for further details). She is able to feed herself with min assist with R UE but fatigues quickly. Pt would benefit from continued OT services while admitted to improve independence with ADL and functional mobility. Recommend CIR placement for continued rehabilitation services in order to maximize return to PLOF. OT will continue to follow acutely.    Follow Up Recommendations  CIR;Supervision/Assistance - 24 hour    Equipment Recommendations  Other (comment) (TBD at next venue of care)    Recommendations for Other Services Rehab consult     Precautions / Restrictions Precautions Precautions: Fall Restrictions Weight Bearing Restrictions: No      Mobility Bed Mobility Overal bed mobility: Needs Assistance Bed Mobility: Rolling;Sidelying to Sit Rolling: Mod assist Sidelying to sit: Max assist       General bed mobility comments: Assist for legs off bed and to raise trunk from bed.  Transfers Overall transfer level: Needs assistance Equipment used:  None Transfers: Stand Pivot Transfers   Stand pivot transfers: Max assist;+2 physical assistance       General transfer comment: Lifting assist to raise hips from bed and tuck under body for upright.    Balance Overall balance assessment: Needs assistance Sitting-balance support: Bilateral upper extremity supported;Feet unsupported Sitting balance-Leahy Scale: Poor Sitting balance - Comments: Able to maintain sitting balance for 10 seconds with min guard assist. Otherwise required mod assist.   Standing balance support: During functional activity Standing balance-Leahy Scale: Zero                              ADL Overall ADL's : Needs assistance/impaired Eating/Feeding: Sitting;Minimal assistance   Grooming: Moderate assistance;Sitting Grooming Details (indicate cue type and reason): Mod assist to raise L UE to head to complete hair brushing task. Mod assist for balance seated EOB. Upper Body Bathing: Maximal assistance;Sitting   Lower Body Bathing: Total assistance;Bed level   Upper Body Dressing : Maximal assistance;Sitting   Lower Body Dressing: Total assistance;Bed level   Toilet Transfer: Maximal assistance;+2 for physical assistance;Stand-pivot Toilet Transfer Details (indicate cue type and reason): Simulated with stand pivot transfer from bed to chair with max assist +2. Used pad to assist in lifting pt to standing. Toileting- Clothing Manipulation and Hygiene: Maximal assistance;Bed level         General ADL Comments: Pt requiring max encouragement to participate with therapy. Required up to mod assist to maintain sitting balance but able to support self for 10 seconds without external assistance. Able to bring cup to mouth with min assist with R hand but limited strength and activity tolerance.  Vision Vision Assessment?: No apparent visual deficits   Perception     Praxis      Pertinent Vitals/Pain Pain Assessment: Faces Faces Pain Scale:  Hurts whole lot Pain Location: Bilateral thighs Pain Descriptors / Indicators: Tingling;Burning;Stabbing Pain Intervention(s): Limited activity within patient's tolerance;Monitored during session;Repositioned     Hand Dominance Right   Extremity/Trunk Assessment Upper Extremity Assessment Upper Extremity Assessment: RUE deficits/detail;LUE deficits/detail RUE Deficits / Details: Reports sensation in tact. Grasp strength 3+/5. AAROM WFL, strength shoulder flexion 2+/5, elbow flex 4-/5, ext 3+/5 RUE Sensation: decreased proprioception RUE Coordination: decreased fine motor LUE Deficits / Details: Reports sensation in tact; grasp strength 3+/5AAROM WFL, strength shoulder flex 2-/5, elbow flex 4-/5, ext 2+/5, grip weaker than R LUE Coordination: decreased fine motor   Lower Extremity Assessment Lower Extremity Assessment: Defer to PT evaluation       Communication Communication Communication: No difficulties   Cognition Arousal/Alertness: Awake/alert Behavior During Therapy: Anxious Overall Cognitive Status: Within Functional Limits for tasks assessed                     General Comments       Exercises       Shoulder Instructions      Home Living Family/patient expects to be discharged to:: Private residence Living Arrangements: Spouse/significant other Available Help at Discharge: Family;Available 24 hours/day (husband can take leave to assist) Type of Home: House Home Access: Stairs to enter [employer] of Steps: 4 Entrance Stairs-Rails: Right;Left Home Layout: One level     Bathroom Shower/Tub: Tub/shower unit Shower/tub characteristics: Architectural technologist: Standard     Home Equipment: Shower seat;Grab bars - tub/shower          Prior Functioning/Environment Level of Independence: Independent                 OT Problem List: Decreased strength;Decreased range of motion;Decreased activity tolerance;Impaired balance (sitting  and/or standing);Decreased safety awareness;Decreased knowledge of use of DME or AE;Decreased knowledge of precautions;Pain   OT Treatment/Interventions: Self-care/ADL training;Therapeutic exercise;Neuromuscular education;Patient/family education;Balance training;Therapeutic activities;DME and/or AE instruction;Energy conservation    OT Goals(Current goals can be found in the care plan section) Acute Rehab OT Goals Patient Stated Goal: To return to independent OT Goal Formulation: With patient/family Time For Goal Achievement: 01/28/17 Potential to Achieve Goals: Good ADL Goals Pt Will Perform Grooming: with min assist;standing Pt Will Transfer to Toilet: with min assist;ambulating;bedside commode Pt Will Perform Toileting - Clothing Manipulation and hygiene: with min assist;sit to/from stand Pt/caregiver will Perform Home Exercise Program: Increased ROM;Increased strength;Both right and left upper extremity;With Supervision;With written HEP provided Additional ADL Goal #1: Pt will complete bed mobility at modified independent level in preparation for ADL tasks.  OT Frequency: Min 2X/week   Barriers to D/C:            Co-evaluation              End of Session Equipment Utilized During Treatment: Gait belt Nurse Communication: Mobility status  Activity Tolerance: Patient tolerated treatment well Patient left: in chair;with call bell/phone within reach;with chair alarm set   Time: [id number] OT Time Calculation (min): 27 min Charges:  OT General Charges $OT Visit: 1 Procedure OT Evaluation $OT Eval Moderate Complexity: 1 Procedure OT Treatments $Self Care/Home Management : 8-22 mins G-Codes:    Norman Herrlich, OTR/L [phone] 01/14/2017, 2:37 PM

## 2017-01-14 NOTE — Progress Notes (Signed)
Patient performed NIF -58. VC 1.5 L. Rt will continue to monitor

## 2017-01-14 NOTE — Progress Notes (Signed)
Pt performed NIF and VC with good pt effort.  NIF -55, VC 1.3L  RT will continue to monitor

## 2017-01-14 NOTE — Progress Notes (Signed)
12:10 on 01/14/2017  RT attempted to do NIF and VC again. Pt had just started eating lunch.  RT expressed the importance of monitoring her lung function.  RT will continue to monitor and attempt again later this afternoon

## 2017-01-14 NOTE — Progress Notes (Signed)
OT Cancellation Note  Patient Details Name: Joanne Morrison MRN: [id number] DOB: 08/08/1951   Cancelled Treatment:    Reason Eval/Treat Not Completed: Patient adamantly declined. Attempted to see for OT evaluation. Pt reports that she needs to rest after incident this AM on transfer to [facility]. Per RN, pt refused use of bed pan and had to be assisted to floor on attempt to transfer to [facility] due to weakness. Educated pt on importance of participating with therapy and she continued to adamantly decline. OT will check back as able for evaluation.  [id number] OTR/L [id number] 01/14/2017, 10:13 AM

## 2017-01-14 NOTE — Progress Notes (Signed)
Patient had good effort  VC 1.8L NIF -40  Patient stated she did not want to be woken up again tonight for the NIF and VC and would call if she changed her mind. She said she would just wait until morning to do it again. RT instructed that every 4 hours is how the doctor wants to NIF and VC done but patient insisted.

## 2017-01-15 ENCOUNTER — Inpatient Hospital Stay (HOSPITAL_COMMUNITY): Payer: Medicare Other

## 2017-01-15 DIAGNOSIS — R531 Weakness: Secondary | ICD-10-CM | POA: Diagnosis not present

## 2017-01-15 DIAGNOSIS — E222 Syndrome of inappropriate secretion of antidiuretic hormone: Secondary | ICD-10-CM | POA: Diagnosis not present

## 2017-01-15 DIAGNOSIS — E871 Hypo-osmolality and hyponatremia: Secondary | ICD-10-CM | POA: Diagnosis not present

## 2017-01-15 DIAGNOSIS — J9600 Acute respiratory failure, unspecified whether with hypoxia or hypercapnia: Secondary | ICD-10-CM | POA: Diagnosis not present

## 2017-01-15 DIAGNOSIS — G610 Guillain-Barre syndrome: Secondary | ICD-10-CM | POA: Diagnosis not present

## 2017-01-15 DIAGNOSIS — G63 Polyneuropathy in diseases classified elsewhere: Secondary | ICD-10-CM | POA: Diagnosis not present

## 2017-01-15 DIAGNOSIS — F329 Major depressive disorder, single episode, unspecified: Secondary | ICD-10-CM | POA: Diagnosis not present

## 2017-01-15 DIAGNOSIS — I959 Hypotension, unspecified: Secondary | ICD-10-CM | POA: Diagnosis not present

## 2017-01-15 DIAGNOSIS — B999 Unspecified infectious disease: Secondary | ICD-10-CM | POA: Diagnosis not present

## 2017-01-15 LAB — BLOOD GAS, ARTERIAL
Acid-base deficit: 4.1 mmol/L — ABNORMAL HIGH (ref 0.0–2.0)
Bicarbonate: 20.3 mmol/L (ref 20.0–28.0)
DRAWN BY: 280981
FIO2: 100
O2 SAT: 99 %
PCO2 ART: 36.7 mmHg (ref 32.0–48.0)
PEEP: 5 cmH2O
PH ART: 7.363 (ref 7.350–7.450)
Patient temperature: 98.6
RATE: 14 resp/min
VT: 400 mL
pO2, Arterial: 168 mmHg — ABNORMAL HIGH (ref 83.0–108.0)

## 2017-01-15 LAB — URINALYSIS, ROUTINE W REFLEX MICROSCOPIC
Bacteria, UA: NONE SEEN
Bilirubin Urine: NEGATIVE
HGB URINE DIPSTICK: NEGATIVE
Ketones, ur: 80 mg/dL — AB
LEUKOCYTES UA: NEGATIVE
Nitrite: NEGATIVE
Protein, ur: 100 mg/dL — AB
SPECIFIC GRAVITY, URINE: 1.021 (ref 1.005–1.030)
pH: 5 (ref 5.0–8.0)

## 2017-01-15 LAB — BASIC METABOLIC PANEL
ANION GAP: 12 (ref 5–15)
ANION GAP: 8 (ref 5–15)
Anion gap: 14 (ref 5–15)
BUN: 11 mg/dL (ref 6–20)
BUN: 12 mg/dL (ref 6–20)
BUN: 11 mg/dL (ref 6–20)
CALCIUM: 9.1 mg/dL (ref 8.9–10.3)
CHLORIDE: 90 mmol/L — AB (ref 101–111)
CO2: 18 mmol/L — ABNORMAL LOW (ref 22–32)
CO2: 18 mmol/L — ABNORMAL LOW (ref 22–32)
CO2: 20 mmol/L — ABNORMAL LOW (ref 22–32)
CREATININE: 0.72 mg/dL (ref 0.44–1.00)
Calcium: 9.2 mg/dL (ref 8.9–10.3)
Calcium: 8.4 mg/dL — ABNORMAL LOW (ref 8.9–10.3)
Chloride: 90 mmol/L — ABNORMAL LOW (ref 101–111)
Chloride: 92 mmol/L — ABNORMAL LOW (ref 101–111)
Creatinine, Ser: 0.75 mg/dL (ref 0.44–1.00)
Creatinine, Ser: 0.58 mg/dL (ref 0.44–1.00)
GFR calc non Af Amer: >60 mL/min (ref >60)
GLUCOSE: 125 mg/dL — AB (ref 65–99)
Glucose, Bld: 114 mg/dL — ABNORMAL HIGH (ref 65–99)
Glucose, Bld: 123 mg/dL — ABNORMAL HIGH (ref 65–99)
POTASSIUM: 3.6 mmol/L (ref 3.5–5.1)
POTASSIUM: 3.7 mmol/L (ref 3.5–5.1)
POTASSIUM: 3.8 mmol/L (ref 3.5–5.1)
SODIUM: 122 mmol/L — AB (ref 135–145)
SODIUM: 120 mmol/L — AB (ref 135–145)
Sodium: 120 mmol/L — ABNORMAL LOW (ref 135–145)

## 2017-01-15 LAB — CBC
HCT: 41.6 % (ref 36.0–46.0)
Hemoglobin: 14.1 g/dL (ref 12.0–15.0)
MCH: 26.6 pg (ref 26.0–34.0)
MCHC: 33.9 g/dL (ref 30.0–36.0)
MCV: 78.3 fL (ref 78.0–100.0)
PLATELETS: 355 10*3/uL (ref 150–400)
RBC: 5.31 MIL/uL — AB (ref 3.87–5.11)
RDW: 13.8 % (ref 11.5–15.5)
WBC: 22.3 10*3/uL — AB (ref 4.0–10.5)

## 2017-01-15 LAB — PHOSPHORUS: Phosphorus: 3 mg/dL (ref 2.5–4.6)

## 2017-01-15 LAB — OSMOLALITY
OSMOLALITY: 261 mosm/kg — AB (ref 275–295)
OSMOLALITY: 267 mosm/kg — AB (ref 275–295)

## 2017-01-15 LAB — GLUCOSE, CAPILLARY
GLUCOSE-CAPILLARY: 125 mg/dL — AB (ref 65–99)
Glucose-Capillary: 127 mg/dL — ABNORMAL HIGH (ref 65–99)

## 2017-01-15 LAB — MAGNESIUM: MAGNESIUM: 1.7 mg/dL (ref 1.7–2.4)

## 2017-01-15 LAB — OSMOLALITY, URINE: Osmolality, Ur: 819 mOsm/kg (ref 300–900)

## 2017-01-15 LAB — SODIUM: Sodium: 123 mmol/L — ABNORMAL LOW (ref 135–145)

## 2017-01-15 LAB — TRIGLYCERIDES: TRIGLYCERIDES: 70 mg/dL (ref <150)

## 2017-01-15 LAB — SODIUM, URINE, RANDOM: Sodium, Ur: 69 mmol/L

## 2017-01-15 MED ORDER — MIDAZOLAM HCL 2 MG/2ML IJ SOLN
2.0000 mg | Freq: Once | INTRAMUSCULAR | Status: AC
  Administered 2017-01-15: 2 mg via INTRAVENOUS

## 2017-01-15 MED ORDER — FENTANYL CITRATE (PF) 100 MCG/2ML IJ SOLN
INTRAMUSCULAR | Status: AC
  Filled 2017-01-15: qty 2

## 2017-01-15 MED ORDER — SODIUM CHLORIDE 0.9 % IV SOLN
INTRAVENOUS | Status: DC
  Administered 2017-01-15: 06:00:00 via INTRAVENOUS

## 2017-01-15 MED ORDER — ETOMIDATE 2 MG/ML IV SOLN
20.0000 mg | Freq: Once | INTRAVENOUS | Status: AC
  Administered 2017-01-15: 20 mg via INTRAVENOUS

## 2017-01-15 MED ORDER — CHLORHEXIDINE GLUCONATE 0.12% ORAL RINSE (MEDLINE KIT)
15.0000 mL | Freq: Two times a day (BID) | OROMUCOSAL | Status: DC
  Administered 2017-01-15 – 2017-02-03 (×37): 15 mL via OROMUCOSAL

## 2017-01-15 MED ORDER — ROCURONIUM BROMIDE 50 MG/5ML IV SOLN
50.0000 mg | Freq: Once | INTRAVENOUS | Status: AC
  Administered 2017-01-15: 50 mg via INTRAVENOUS

## 2017-01-15 MED ORDER — PANTOPRAZOLE SODIUM 40 MG IV SOLR
40.0000 mg | INTRAVENOUS | Status: DC
  Administered 2017-01-15 – 2017-01-16 (×2): 40 mg via INTRAVENOUS
  Filled 2017-01-15 (×3): qty 40

## 2017-01-15 MED ORDER — SODIUM CHLORIDE 0.9 % IV SOLN
25.0000 ug/h | INTRAVENOUS | Status: DC
  Administered 2017-01-15: 50 ug/h via INTRAVENOUS
  Administered 2017-01-16: 100 ug/h via INTRAVENOUS
  Administered 2017-01-17 – 2017-01-18 (×2): 150 ug/h via INTRAVENOUS
  Administered 2017-01-18 – 2017-01-19 (×2): 175 ug/h via INTRAVENOUS
  Administered 2017-01-20 – 2017-01-23 (×6): 200 ug/h via INTRAVENOUS
  Administered 2017-01-23: 300 ug/h via INTRAVENOUS
  Administered 2017-01-23: 200 ug/h via INTRAVENOUS
  Administered 2017-01-24 – 2017-01-25 (×2): 400 ug/h via INTRAVENOUS
  Administered 2017-01-26: 175 ug/h via INTRAVENOUS
  Filled 2017-01-15 (×19): qty 50

## 2017-01-15 MED ORDER — HEPARIN SODIUM (PORCINE) 5000 UNIT/ML IJ SOLN
5000.0000 [IU] | Freq: Three times a day (TID) | INTRAMUSCULAR | Status: AC
  Administered 2017-01-15 – 2017-01-25 (×30): 5000 [IU] via SUBCUTANEOUS
  Filled 2017-01-15 (×30): qty 1

## 2017-01-15 MED ORDER — FLUCONAZOLE IN SODIUM CHLORIDE 200-0.9 MG/100ML-% IV SOLN
200.0000 mg | INTRAVENOUS | Status: DC
  Administered 2017-01-15 – 2017-01-21 (×7): 200 mg via INTRAVENOUS
  Filled 2017-01-15 (×9): qty 100

## 2017-01-15 MED ORDER — VITAL HIGH PROTEIN PO LIQD: 1000.0000 mL | ORAL | Status: DC

## 2017-01-15 MED ORDER — FENTANYL CITRATE (PF) 100 MCG/2ML IJ SOLN
100.0000 ug | Freq: Once | INTRAMUSCULAR | Status: AC
  Administered 2017-01-15: 100 ug via INTRAVENOUS

## 2017-01-15 MED ORDER — VITAL AF 1.2 CAL PO LIQD
1000.0000 mL | ORAL | Status: DC
Start: 2017-01-16 — End: 2017-02-02
  Administered 2017-01-16 – 2017-02-02 (×16): 1000 mL

## 2017-01-15 MED ORDER — ORAL CARE MOUTH RINSE
15.0000 mL | OROMUCOSAL | Status: DC
  Administered 2017-01-15 – 2017-02-04 (×187): 15 mL via OROMUCOSAL

## 2017-01-15 MED ORDER — SODIUM CHLORIDE 3 % IV SOLN
INTRAVENOUS | Status: DC
  Administered 2017-01-15 – 2017-01-16 (×2): 50 mL/h via INTRAVENOUS
  Filled 2017-01-15 (×6): qty 500

## 2017-01-15 MED ORDER — DULOXETINE HCL 30 MG PO CPEP
30.0000 mg | ORAL_CAPSULE | Freq: Every day | ORAL | Status: DC
  Administered 2017-01-15: 30 mg via ORAL
  Filled 2017-01-15: qty 1

## 2017-01-15 MED ORDER — MIDAZOLAM HCL 2 MG/2ML IJ SOLN
INTRAMUSCULAR | Status: AC
  Filled 2017-01-15: qty 2

## 2017-01-15 MED ORDER — FENTANYL CITRATE (PF) 100 MCG/2ML IJ SOLN: 50.0000 ug | Freq: Once | INTRAMUSCULAR | Status: AC

## 2017-01-15 MED ORDER — PROPOFOL 1000 MG/100ML IV EMUL
0.0000 ug/kg/min | INTRAVENOUS | Status: DC
  Administered 2017-01-15 – 2017-01-16 (×2): 10 ug/kg/min via INTRAVENOUS
  Administered 2017-01-16: 30 ug/kg/min via INTRAVENOUS
  Administered 2017-01-17: 15 ug/kg/min via INTRAVENOUS
  Administered 2017-01-17: 20 ug/kg/min via INTRAVENOUS
  Administered 2017-01-18 (×2): 25 ug/kg/min via INTRAVENOUS
  Administered 2017-01-18: 20 ug/kg/min via INTRAVENOUS
  Administered 2017-01-19: 25 ug/kg/min via INTRAVENOUS
  Administered 2017-01-19 – 2017-01-20 (×4): 30 ug/kg/min via INTRAVENOUS
  Administered 2017-01-20: 15 ug/kg/min via INTRAVENOUS
  Administered 2017-01-21 (×2): 30 ug/kg/min via INTRAVENOUS
  Filled 2017-01-15 (×16): qty 100

## 2017-01-15 MED ORDER — SODIUM CHLORIDE 0.9 % IV SOLN
3.0000 g | Freq: Three times a day (TID) | INTRAVENOUS | Status: DC
  Administered 2017-01-15 – 2017-01-17 (×6): 3 g via INTRAVENOUS
  Filled 2017-01-15 (×8): qty 3

## 2017-01-15 MED ORDER — PRO-STAT SUGAR FREE PO LIQD
30.0000 mL | Freq: Two times a day (BID) | ORAL | Status: DC
  Administered 2017-01-15 – 2017-02-04 (×38): 30 mL
  Filled 2017-01-15 (×39): qty 30

## 2017-01-15 NOTE — Progress Notes (Signed)
Assisted with transport to 4N15, ambu bag at bedside.  ICU staff at bedside for planned intubation

## 2017-01-15 NOTE — Consult Note (Signed)
Name: Joanne Morrison MRN: [id number] DOB: [birth year]    ADMISSION DATE:  01/12/2017 CONSULTATION DATE:  2/1  REFERRING MD :  Ghimire ([facility])   CHIEF COMPLAINT:  Dyspnea, potential for respiratory compromise   BRIEF PATIENT DESCRIPTION: 66yo female with hx chronic cystitis, depression/anxiety who presented 1/29 with L>R BLE weakness, LUE weakness and worsening myalgias x 3-4 days.  She was recently dx with strep pharyngitis and completed a course of zithromax.   She was seen in consultation by neurology and felt her s/s were most c/w guillain barre. She was started on IVIG 1/30.  On 2/1 she developed increased weakness, worsening NIF and difficulty with secretions and PCCM consulted with concern for potential deterioration of her respiratory status.        HISTORY OF PRESENT ILLNESS:  66yo female with hx chronic cystitis, depression/anxiety who presented 1/29 with L>R BLE weakness, LUE weakness and worsening myalgias x 3-4 days.  She was recently dx with strep pharyngitis and completed a course of zithromax.    She was seen in consultation by neurology and felt her s/s were most c/w guillain barre. She was started on IVIG 1/30.  On 2/1 she developed increased weakness, worsening NIF and difficulty with secretions and PCCM consulted with concern for potential deterioration of her respiratory status.   Currently denies chest pain, back pain, hemoptysis, headache.  Does feel more SOB.  C/o ongoing generalized weakness, worse in BLE.  New facial weakness/numbness today.     PAST MEDICAL HISTORY :   has a past medical history of Anxiety; Arthritis; Chronic cystitis; Chronic low back pain; Depression; Diverticulosis of colon; Hemorrhoids; History of colon polyps; History of endometriosis; History of panic attacks; Hyperlipidemia; Osteopenia; Pelvic pain; Sensation of pressure in bladder area; and Wears glasses.  has a past surgical history that includes Colonoscopy w/ polypectomy (09/24/2009); Carpal  tunnel release (Bilateral, right 2014;  left 2007); Hysteroscopy w/D&C (11/01/2004); Laparoscopic vaginal hysterectomy with salpingo oophorectomy (Bilateral, 04/20/2012); Tubal ligation (yrs ago); Bunionectomy (Left, 2004 approx); CYSTO/  HYDRODISTENTION/  INSTILLATION THERAPY (1997 approx); and cysto with hydrodistension ([name], 10/15/2016). Prior to Admission medications   Medication Sig Start Date End Date Taking? Authorizing Provider  benzonatate (TESSALON) 100 MG capsule Take 100 mg by mouth 3 (three) times daily as needed for cough. 01/06/17  Yes Historical Provider, MD  buPROPion (WELLBUTRIN XL) 150 MG 24 hr tablet Take 150 mg by mouth every morning.   Yes Historical Provider, MD  diazepam (VALIUM) 10 MG tablet Take 10 mg by mouth every 12 (twelve) hours as needed for anxiety.   Yes Historical Provider, MD  estradiol (VIVELLE-DOT) 0.1 MG/24HR patch Place 1 patch onto the skin 2 (two) times a week. 12/11/16  Yes Historical Provider, MD  HYDROcodone-acetaminophen (NORCO/VICODIN) 5-325 MG tablet Take 1-2 tablets by mouth every 6 (six) hours as needed for moderate pain.    Yes Historical Provider, MD  losartan (COZAAR) 25 MG tablet Take 25 mg by mouth daily. 01/01/17  Yes Historical Provider, MD  Promethazine-Phenyleph-Codeine (PROMETHAZINE VC/CODEINE) 6.25-5-10 MG/5ML SYRP Take 5 mLs by mouth daily as needed for cough. 01/09/17  Yes Historical Provider, MD  rizatriptan (MAXALT) 10 MG tablet Take 1 tablet (10 mg total) by mouth as needed. May repeat in 2 hours if needed 12/05/13  Yes Hendricks Limes, MD  zolpidem (AMBIEN) 10 MG tablet Take 10 mg by mouth at bedtime.   Yes Historical Provider, MD  azithromycin (ZITHROMAX) 250 MG tablet Take 250 mg by mouth daily. 01/03/17  Historical Provider, MD  levofloxacin (LEVAQUIN) 500 MG tablet Take 500 mg by mouth daily. 01/06/17   Historical Provider, MD  predniSONE (DELTASONE) 10 MG tablet Take 10-40 mg by mouth daily. 01/06/17   Historical Provider, MD    Allergies  Allergen Reactions  . Olanzapine Other (See Comments)    STIFF JOINTS, EXTREMITY WEAKNESS, MEMORY LOSS, LOSS OF BALANCE  . Sumatriptan Other (See Comments)    REACTION: altered mental status  . Graciella Freer D-S] Other (See Comments)    lethargic    FAMILY HISTORY:  family history includes Cancer in her maternal uncle; Dementia in her mother; Diabetes in her maternal aunt; Heart attack (age of onset: 4) in her father; Heart attack (age of onset: 73) in her maternal grandmother; Hypertension in her brother and mother; Lung cancer in her maternal grandfather and mother; Osteoporosis in her mother; Pulmonary embolism in her brother. SOCIAL HISTORY:  reports that she has never smoked. She has never used smokeless tobacco. She reports that she does not drink alcohol or use drugs.  REVIEW OF SYSTEMS:   As per HPI - All other systems reviewed and were neg.    SUBJECTIVE:   VITAL SIGNS: Temp:  [98 F (36.7 C)-98.7 F (37.1 C)] 98 F (36.7 C) (02/01 0936) Pulse Rate:  [89-112] 112 (02/01 1359) Resp:  [20-89] 20 (02/01 1359) BP: (153-186)/(82-102) 186/102 (02/01 1359) SpO2:  [94 %-99 %] 95 % (02/01 1359)   Recent Labs Lab 01/13/17 0328 01/15/17 0404 01/15/17 1030  NA 137 122* 120*  K 3.3* 3.6 3.7  CL 103 90* 90*  CO2 19* 18* 18*  BUN 8 11 12   CREATININE 0.95 0.72 0.75  GLUCOSE 110* 114* 123*    Recent Labs Lab 01/12/17 1204 01/13/17 0328 01/15/17 0404  HGB 14.2 14.2 14.1  HCT 42.9 42.2 41.6  WBC 11.5* 11.9* 22.3*  PLT 289 355 355   Dg Chest Port 1 View  Result Date: 01/15/2017 CLINICAL DATA:  Shortness of breath . EXAM: PORTABLE CHEST 1 VIEW COMPARISON:  No recent prior . FINDINGS: Mediastinum hilar structures are normal. Cardiomegaly with normal pulmonary vascularity. Low lung volumes. Mild bibasilar infiltrates. Small left pleural effusion cannot be excluded . IMPRESSION: Low lung volumes. Mild bibasilar infiltrates. Small left pleural effusion  cannot be excluded. Electronically Signed   By: Marcello Moores  Register   On: 01/15/2017 13:12   Dg Fluoro Guide Lumbar Puncture  Result Date: 01/13/2017 CLINICAL DATA:  Sudden onset of inability to walk. Possible Guillain-Barre syndrome. EXAM: DIAGNOSTIC LUMBAR PUNCTURE UNDER FLUOROSCOPIC GUIDANCE FLUOROSCOPY TIME:  Fluoroscopy Time:  0 minutes and 42 seconds Radiation Exposure Index (if provided by the fluoroscopic device): 4.1 mGy Number of Acquired Spot Images: 0 PROCEDURE: Informed consent was obtained from the patient prior to the procedure, including potential complications of headache, allergy, and pain. With the patient prone, the lower back was prepped with Betadine. 1% Lidocaine was used for local anesthesia. Lumbar puncture was performed at the L3-4 level using a 20 gauge needle with return of clear CSF. 8.5 Ml of CSF were obtained for laboratory studies. The patient tolerated the procedure well and there were no apparent complications. IMPRESSION: Fluoroscopic guided lumbar puncture with 8.5 cc clear CSF obtained for appropriate laboratory evaluation. Electronically Signed   By: Marijo Sanes M.D.   On: 01/13/2017 16:49    HEMODYNAMICS:    VENTILATOR SETTINGS:    INTAKE / OUTPUT: I/O last 3 completed shifts: In: 480 [P.O.:480] Out: 980 [Urine:980]  PHYSICAL EXAMINATION:  General:  Pleasant female, NAD  Neuro:  Awake, alert, appropriate, significant generalized weakness, R eye ptosis HEENT:  Mm moist, no JVD  Cardiovascular:  s1s2 rrr  Lungs:  resps even, mildly labored, shallow breaths, weak cough, difficulty controlling secretions, scattered rhonchi R>L, speaks one-word at a time Abdomen:  Round, soft, non tender  Musculoskeletal:  Warm and dry, no edema   LABS:  BMET  Recent Labs Lab 01/13/17 0328 01/15/17 0404 01/15/17 1030  NA 137 122* 120*  K 3.3* 3.6 3.7  CL 103 90* 90*  CO2 19* 18* 18*  BUN 8 11 12   CREATININE 0.95 0.72 0.75  GLUCOSE 110* 114* 123*     Electrolytes  Recent Labs Lab 01/13/17 0328 01/15/17 0404 01/15/17 1030  CALCIUM 9.3 9.2 9.1    CBC  Recent Labs Lab 01/12/17 1204 01/13/17 0328 01/15/17 0404  WBC 11.5* 11.9* 22.3*  HGB 14.2 14.2 14.1  HCT 42.9 42.2 41.6  PLT 289 355 355    Coag's No results for input(s): APTT, INR in the last 168 hours.  Sepsis Markers  Recent Labs Lab 01/12/17 1225  LATICACIDVEN 2.22*    ABG No results for input(s): PHART, PCO2ART, PO2ART in the last 168 hours.  Liver Enzymes  Recent Labs Lab 01/12/17 1204  AST 28  ALT 18  ALKPHOS 46  BILITOT 0.9  ALBUMIN 4.0    Cardiac Enzymes No results for input(s): TROPONINI, PROBNP in the last 168 hours.  Glucose No results for input(s): GLUCAP in the last 168 hours.  Imaging Dg Chest Port 1 View  Result Date: 01/15/2017 CLINICAL DATA:  Shortness of breath . EXAM: PORTABLE CHEST 1 VIEW COMPARISON:  No recent prior . FINDINGS: Mediastinum hilar structures are normal. Cardiomegaly with normal pulmonary vascularity. Low lung volumes. Mild bibasilar infiltrates. Small left pleural effusion cannot be excluded . IMPRESSION: Low lung volumes. Mild bibasilar infiltrates. Small left pleural effusion cannot be excluded. Electronically Signed   By: Marcello Moores  Register   On: 01/15/2017 13:12    SIGNIFICANT EVENTS  IVIG 1/30, 1/31, 2/1  STUDIES:  MR brain 1/29>>>neg LP 1/30>>>  CULTURES: CSF 1/30>>> BC x 2 2/1>>>   ANTIBIOTICS: unasyn 2/1>>>  LINES/TUBES: ETT 2/1>>>  DISCUSSION:   ASSESSMENT / PLAN:  PULMONARY Acute respiratory failure - in setting neuromuscular weakness presumed r/t guillane barre.   ?aspiration  P:   tx ICU  Intubate  Vent support - 8cc/kg  F/u CXR  F/u ABG  Continue IVIG per neuro    NEUROLOGIC Guillain barre syndrome  Sedation needs on vent  P:   RASS goal: -1 Propofol, fentanyl gtt  Neuro following  S/p IVIG x 3 days - continue per neuro  Follow CSF cultures    CARDIOVASCULAR Hx HTN  P:  Hold home losartan   RENAL Significant hyponatremia - new onset.  Low serum osm, high urine osm.  Likely r/t IVIG due to hyperproteinemia.    P:   F/u chem  KVO IVF Repeat u/a and urine studies now    GASTROINTESTINAL Dysphagia  P:   NPO  PPI  TF  Will need speech eval prior to PO's once extubated   HEMATOLOGIC Leukocytosis  P:  Monitor fever curve  Check pct  SQ Heparin  F/u cbc   INFECTIOUS ? Aspiration  Leukocytosis  P:   Empiric unasyn for now    ENDOCRINE No active issue  P:   Monitor glucose on chem    FAMILY  - Updates:  Husband updated  at length at bedside 2/1  - Inter-disciplinary family meet or Palliative Care meeting due by: 2/8   Cc time: 40 minutes   Nickolas Madrid, NP 01/15/2017  3:51 PM Pager: [phone] or [phone]  Attending Note:  66 year old female with a new diagnosis of GBS who presents to PCCM with rapidly progressive weakness and respiratory failure.  On exam, the patient has deteriorated where she can barely cough and now is speaking 2 words sentences.  I reviewed CXR myself, bibasilar infiltrate and low volumes.  NIF is down to 36.  I am very concerned about she is no longer able to protect her airway.  WBC is up to 22.3 and with CXR findings I am also concerned for aspiration PNA.  Discussed with PCCM-NP and TRH-MD.  Will transfer to ICU and assume care after intubation.  Patient developed hyponatremia which I assume is related to SIADH (common with GBS) vs falsely lowered Na due to IVIg.  Discussed with Dr. Justin Mend from renal, the urine osmolality being that elevated is very unusual.  We will place a TLC, check a repeat BMET at 5 PM, if continues to drop then will need to consider 3% saline given the rapid rate of drop of Na.  Will revisit.  Husband updated bedside.  The patient is critically ill with multiple organ systems failure and requires high complexity decision making for assessment and  support, frequent evaluation and titration of therapies, application of advanced monitoring technologies and extensive interpretation of multiple databases.   Critical Care Time devoted to patient care services described in this note is  90  Minutes. This time reflects time of care of this signee Dr Jennet Maduro. This critical care time does not reflect procedure time, or teaching time or supervisory time of PA/NP/Med student/Med Resident etc but could involve care discussion time.  Rush Farmer, M.D. [facility] Pulmonary/Critical Care Medicine. Pager: [phone]. After hours pager: [phone].

## 2017-01-15 NOTE — Progress Notes (Signed)
Notified by lab of osmol 261. MD notified. IVIG infusing. Need another IV line for NS, intended to consult IV team. Report off to oncoming nurse.

## 2017-01-15 NOTE — Progress Notes (Signed)
Initial Nutrition Assessment  INTERVENTION:   Vital AF 1.2 @ 40 ml/hr (960 ml/day) 30 ml Prostat BID Provides: 1352 kcal, 102 grams protein, and 778 ml H2O.   NUTRITION DIAGNOSIS:   Inadequate oral intake related to inability to eat as evidenced by NPO status.  GOAL:   Patient will meet greater than or equal to 90% of their needs  MONITOR:   TF tolerance, Vent status, I & O's, Labs  REASON FOR ASSESSMENT:   Consult Enteral/tube feeding initiation and management  ASSESSMENT:   66yo female with hx chronic cystitis, depression/anxiety who presented 1/29 with L>R BLE weakness, LUE weakness and worsening myalgias x 3-4 days.  She was recently dx with strep pharyngitis and completed a course of zithromax.   She was seen in consultation by neurology and felt her s/s were most c/w guillain barre. She was started on IVIG 1/30.  Patient is currently intubated on ventilator support MV: 8 L/min Temp (24hrs), Avg:98.4 F (36.9 C), Min:98 F (36.7 C), Max:98.7 F (37.1 C)  Na 120 Unable to complete Nutrition-Focused physical exam at this time. Pt has just been intubated.   Diet Order:  Diet NPO time specified  Skin:  Reviewed, no issues  Last BM:  1/30  Height:   Ht Readings from Last 1 Encounters:  01/15/17 5\' 2"  (1.575 m)    Weight:   Wt Readings from Last 1 Encounters:  01/12/17 157 lb 10.1 oz (71.5 kg)    Ideal Body Weight:  50 kg  BMI:  Body mass index is 28.83 kg/m.  Estimated Nutritional Needs:   Kcal:  1405  Protein:  100-110 grams  Fluid:  > 1.5 L/day  EDUCATION NEEDS:   No education needs identified at this time  [place], [place], [place] Pager [phone] After Hours Pager

## 2017-01-15 NOTE — Progress Notes (Signed)
NIF and VC performed with good effort. NIF-36, VC 1.17. Pt in no distress, VS within normal limits. RT will continue to monitor.

## 2017-01-15 NOTE — Progress Notes (Signed)
Physical Therapy Treatment Patient Details Name: Joanne Morrison MRN: [id number] DOB: 1951/03/13 Today's Date: 01/15/2017    History of Present Illness Patient is a 66 y.o. female with medical history significant of chronic low back pain, anxiety, depression, hypertension-recently had sorethroat (+strep) presented to the ED on 1/29 with lower ext weakness,tingling numbess that started in the lower extremities and subsequently started involving her upper extremities. Unable to elicit DTR in the lower ext. MRI of the entire spine negative. 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    PT Comments    Pt with increased weakness this session as compared to Eval.  Pt eager for OOB to chair, however pt is a Max - Total Ax2 for all mobility.  Will continue to follow.    Follow Up Recommendations  CIR     Equipment Recommendations  None recommended by PT    Recommendations for Other Services       Precautions / Restrictions Precautions Precautions: Fall Restrictions Weight Bearing Restrictions: No    Mobility  Bed Mobility Overal bed mobility: Needs Assistance Bed Mobility: Rolling;Sidelying to Sit Rolling: Max assist;+2 for physical assistance Sidelying to sit: Total assist;+2 for physical assistance       General bed mobility comments: cues for pt to try as much as able.  Hand over hand cueing.  pt with very minimal ability to A throughout bed mobility.    Transfers Overall transfer level: Needs assistance Equipment used: 2 person hand held assist Transfers: Sit to/from [facility] Sit to Stand: Total assist;+2 physical assistance Stand pivot transfers: Total assist;+2 physical assistance       General transfer comment: Attempted to come to standing x2.  first time minimally able to clear hips from bed.  2nd time coming to standing utilized pad under hips and pad used for pivot.  pt with very minimal participation.    Ambulation/Gait                  Stairs            Wheelchair Mobility    Modified Rankin (Stroke Patients Only)       Balance Overall balance assessment: Needs assistance Sitting-balance support: Bilateral upper extremity supported;Feet supported Sitting balance-Leahy Scale: Poor Sitting balance - Comments: pt needs ModA throughout sitting to maintain balance.  Trunk and UE ataxia noted.   Standing balance support: During functional activity Standing balance-Leahy Scale: Zero                      Cognition Arousal/Alertness: Awake/alert Behavior During Therapy: Anxious Overall Cognitive Status: Within Functional Limits for tasks assessed                      Exercises      General Comments        Pertinent Vitals/Pain Pain Assessment: Faces Faces Pain Scale: Hurts even more Pain Location: Bilateral thighs Pain Descriptors / Indicators: Tingling;Burning;Stabbing Pain Intervention(s): Monitored during session;Premedicated before session;Repositioned    Home Living                      Prior Function            PT Goals (current goals can now be found in the care plan section) Acute Rehab PT Goals Patient Stated Goal: To return to independent PT Goal Formulation: With patient/family Time For Goal Achievement: 02/24/17 Potential to Achieve Goals: Fair Progress towards PT goals: Not  progressing toward goals - comment (Increased weakness)    Frequency    Min 3X/week      PT Plan Current plan remains appropriate    Co-evaluation             End of Session Equipment Utilized During Treatment: Gait belt Activity Tolerance: Patient limited by fatigue Patient left: in chair;with call bell/phone within reach;with chair alarm set;with family/visitor present     Time: 1040-1103 PT Time Calculation (min) (ACUTE ONLY): 23 min  Charges:  $Therapeutic Activity: 23-37 mins                    G CodesCatarina Hartshorn,  [place] [phone] 01/15/2017, 2:36 PM

## 2017-01-15 NOTE — Progress Notes (Signed)
RT note:  NIF and VC not completed.  Patient currently intubated and sedated.

## 2017-01-15 NOTE — Progress Notes (Signed)
PROGRESS NOTE        PATIENT DETAILS Name: Joanne Morrison Age: 66 y.o. Sex: female Date of Birth: 26-Nov-1951 Admit Date: 01/12/2017 Admitting Physician Evalee Mutton Kristeen Mans, MD [id number] A, MD  Brief Narrative: Patient is a 66 y.o. female with h/o significant for chronic low back pain, anxiety, depression and HTN. She presented to ED on 01/04/17 with lower extremity weakness, paresthesias, myalgias. MRI of the brain and entire spine was negative for acute abnormalities, she was started on empiric tx for Guillain-Barre syndrome and started on empiric plasmapheresis. She underwent lumbar puncture 1/30 that showed elevated protein levels. See below for further details.   Subjective: Patient appears in normal mood this morning, accompanied by her husband. Still reports metallic taste in her mouth, but reports pain is controlled. Continues to have LE weakness, with a slight foot drop on the right.  Assessment/Plan:  Guillain Barr syndrome: Had preceding respiratory illness and was dx with strep throat. LP on 1/30 showed albuminocytogenic dissociation. Neurological exam is essentially unchanged with the exception of a slight right foot drop this morning. She also does complain of "choking sensation" -which she attributes to reflux. Plans are to continue with IgG, and await further neurology recommendations.   Hyponatremia: Likely secondary to dehydration due to inadequate oral intake-however, Na was normal yesterday. ?2/2 IVIG (will d/w pharmcy).Started 0.9 NS, recheck sodium later today.   GERD: Patient reports reflux this morning. SLP evaluation and treatment ordered. Start on protonix, see above.   Paresthesias/neuropathic pain: Controlled - but continues to have paresthesias mostly in her bilateral foot area-continue management with gabapentin, have added cymbalta. Use narcotics prn.   Oral Candidiasis (thrush): Due to prior outpatient prednisone and antibiotic  use.  Continue fluconazole tx.   Urinary Retention:? Due to Rosalee Kaufman, see above. Since this is recurrent, we will go ahead and place Foley catheter. Voiding trial prior to discharge.    Leukocytosis: Afebrile-check UA to make sure she does not have UTI. Blood cultures chest x-ray ordered. No obvious foci of infection apparent-we will empiric antibiotics if either febrile or if foci identified. For now monitor off antibiotics  Essential HTN: Uncontrolled - resume losartan. Follow.   Depression: Continue bupropion.   Chronic low back pain: No acute lesions seen on spinal MRI - continue narcotics prn.   Hypokalemia: Resolved, normalized today at 3.6.   DVT Prophylaxis: SCD's   Code Status: Full code   Family Communication: Spouse at bedside  Disposition Plan: Remain inpatient- CIR versus SNF on discharge  Antimicrobial agents: Anti-infectives    Start     Dose/Rate Route Frequency Ordered Stop   01/14/17 1000  fluconazole (DIFLUCAN) tablet 200 mg     200 mg Oral Daily 01/14/17 0926        Procedures: None at this time  CONSULTS: Neurology CIR  Time spent: 25 minutes-Greater than 50% of this time was spent in counseling, explanation of diagnosis, planning of further management, and coordination of care.  MEDICATIONS: Scheduled Meds: . aspirin EC  325 mg Oral Daily  . buPROPion  150 mg Oral q morning - 10a  . DULoxetine  30 mg Oral Daily  . fluconazole  200 mg Oral Daily  . gabapentin  400 mg Oral TID  . Immune Globulin 10%  400 mg/kg Intravenous Q24 Hr x 5  . lidocaine  1 patch Transdermal Q24H  .  losartan  25 mg Oral Daily  . omega-3 acid ethyl esters  1 g Oral Daily  . pantoprazole (PROTONIX) IV  40 mg Intravenous Q24H  . sodium chloride flush  3 mL Intravenous Q12H  . vitamin C  1,000 mg Oral Daily  . zolpidem  5 mg Oral QHS   Continuous Infusions: . sodium chloride 75 mL/hr at 01/15/17 0614   PRN Meds:.acetaminophen **OR** acetaminophen, albuterol,  diazepam, hydrALAZINE, HYDROcodone-acetaminophen, LORazepam, morphine injection, ondansetron **OR** ondansetron (ZOFRAN) IV   PHYSICAL EXAM: Vital signs: Vitals:   01/15/17 0100 01/15/17 0621 01/15/17 0650 01/15/17 0700  BP: (!) 153/83 (!) 175/87 (!) 163/88 (!) 169/91  Pulse: 91 95 92 90  Resp: 20 (!) 24 (!) 22 (!) 22  Temp: 98.6 F (37 C) 98.3 F (36.8 C) 98.4 F (36.9 C)   TempSrc: Oral Oral Oral   SpO2: 99% 98% 94%   Weight:      Height:       Filed Weights   01/12/17 0803 01/12/17 2126  Weight: 69.4 kg (153 lb) 71.5 kg (157 lb 10.1 oz)   Body mass index is 28.83 kg/m.   General appearance: Awake, alert, not in any distress. Speech Clear. Not toxic Looking Eyes: PERRLA,no scleral icterus.Pink conjunctiva HEENT: Atraumatic and Normocephalic. Thrush evident on oral inspection.  Neck: Supple, no JVD. No cervical lymphadenopathy. No thyromegaly Resp:Good air entry bilaterally, no added sounds  CVS: S1 S2 regular, no murmurs.  GI: Bowel sounds present, Non tender and not distended with no gaurding, rigidity or rebound.No organomegaly Extremities: B/L Lower Ext shows no edema, both legs are warm to touch.  Neurology:  Speech clear, sensation is grossly intact. Psychiatric: Normal judgment and insight. Alert and oriented x 3. Normal mood. Musculoskeletal:No digital cyanosis. Cannot elicit DTR b/l LE.  Strength: 4/5 on LE b/l. Right foot experiencing mild foot drop. RUE 4/5. LUE, 3/5 - cannot hold left arm up for long.  Skin:No Rash, warm and dry Wounds:[name]  I have personally reviewed following labs and imaging studies  LABORATORY DATA: CBC:  Recent Labs Lab 01/11/17 2226 01/12/17 1204 01/13/17 0328 01/15/17 0404  WBC 10.0 11.5* 11.9* 22.3*  NEUTROABS  --  6.6  --   --   HGB 13.9 14.2 14.2 14.1  HCT 42.2 42.9 42.2 41.6  MCV 80.2 80.3 78.7 78.3  PLT 311 289 355 [id number]    Basic Metabolic Panel:  Recent Labs Lab 01/11/17 2226 01/12/17 1204 01/13/17 0328  01/15/17 0404  NA 137 139 137 122*  K 3.9 2.8* 3.3* 3.6  CL 105 107 103 90*  CO2 23 20* 19* 18*  GLUCOSE 129* 96 110* 114*  BUN 18 11 8 11   CREATININE 1.65* 1.14* 0.95 0.72  CALCIUM 9.1 9.1 9.3 9.2    GFR: Estimated Creatinine Clearance: 65.8 mL/min (by C-G formula based on SCr of 0.72 mg/dL).  Liver Function Tests:  Recent Labs Lab 01/12/17 1204  AST 28  ALT 18  ALKPHOS 46  BILITOT 0.9  PROT 6.7  ALBUMIN 4.0   No results for input(s): LIPASE, AMYLASE in the last 168 hours. No results for input(s): AMMONIA in the last 168 hours.  Coagulation Profile: No results for input(s): INR, PROTIME in the last 168 hours.  Cardiac Enzymes:  Recent Labs Lab 01/11/17 2226 01/12/17 2215  CKTOTAL 123 280*    BNP (last 3 results) No results for input(s): PROBNP in the last 8760 hours.  HbA1C: No results for input(s): HGBA1C in the last 72  hours.  CBG: No results for input(s): GLUCAP in the last 168 hours.  Lipid Profile: No results for input(s): CHOL, HDL, LDLCALC, TRIG, CHOLHDL, LDLDIRECT in the last 72 hours.  Thyroid Function Tests:  Recent Labs  01/12/17 2215  TSH 1.645    Anemia Panel:  Recent Labs  01/12/17 2215  VITAMINB12 726    Urine analysis:    Component Value Date/Time   COLORURINE STRAW (A) 01/11/2017 2337   APPEARANCEUR CLEAR 01/11/2017 2337   LABSPEC 1.008 01/11/2017 2337   PHURINE 9.0 (H) 01/11/2017 2337   GLUCOSEU NEGATIVE 01/11/2017 2337   GLUCOSEU NEGATIVE 12/29/2006 0738   HGBUR NEGATIVE 01/11/2017 2337   HGBUR negative 12/05/2009 0933   BILIRUBINUR NEGATIVE 01/11/2017 2337   BILIRUBINUR neg 07/08/2012 1218   KETONESUR 5 (A) 01/11/2017 2337   PROTEINUR NEGATIVE 01/11/2017 2337   UROBILINOGEN 0.2 07/08/2012 1218   UROBILINOGEN 0.2 12/05/2009 0933   NITRITE NEGATIVE 01/11/2017 2337   LEUKOCYTESUR NEGATIVE 01/11/2017 2337    Sepsis Labs: Lactic Acid, Venous    Component Value Date/Time   LATICACIDVEN 2.22 ([place]) 01/12/2017  1225    MICROBIOLOGY: Recent Results (from the past 240 hour(s))  Culture, blood (routine x 2)     Status: None (Preliminary result)   Collection Time: 01/12/17 12:00 PM  Result Value Ref Range Status   Specimen Description BLOOD RIGHT ANTECUBITAL  Final   Special Requests BOTTLES DRAWN AEROBIC AND ANAEROBIC  5CC  Final   Culture NO GROWTH 2 DAYS  Final   Report Status PENDING  Incomplete  Culture, blood (routine x 2)     Status: None (Preliminary result)   Collection Time: 01/12/17 12:15 PM  Result Value Ref Range Status   Specimen Description BLOOD RIGHT HAND  Final   Special Requests IN PEDIATRIC BOTTLE  3CC  Final   Culture NO GROWTH 2 DAYS  Final   Report Status PENDING  Incomplete  CSF culture     Status: None (Preliminary result)   Collection Time: 01/13/17  4:00 PM  Result Value Ref Range Status   Specimen Description CSF  Final   Special Requests NONE  Final   Gram Stain CYTOSPIN SMEAR NO WBC SEEN NO ORGANISMS SEEN   Final   Culture NO GROWTH < 24 HOURS  Final   Report Status PENDING  Incomplete    RADIOLOGY STUDIES/RESULTS: Mr Jeri Cos Wo Contrast  Result Date: 01/12/2017 CLINICAL DATA:  Bilateral lower extremity weakness and paresthesia EXAM: MRI HEAD WITHOUT AND WITH CONTRAST TECHNIQUE: Multiplanar, multiecho pulse sequences of the brain and surrounding structures were obtained without and with intravenous contrast. CONTRAST:  54mL MULTIHANCE GADOBENATE DIMEGLUMINE 529 MG/ML IV SOLN COMPARISON:  None. FINDINGS: Brain: The brain has normal appearance without evidence of malformation, atrophy, old or acute small or large vessel infarction, hemorrhage, hydrocephalus or extra-axial collection. No pituitary abnormality. No abnormal enhancement occurs. Vascular: Major vessels at the base of the brain show flow. Skull and upper cervical spine: Normal Sinuses/Orbits: Clear/ normal. Other: None significant. IMPRESSION: Normal exam. Electronically Signed   By: Nelson Chimes M.D.    On: 01/12/2017 16:37   Mr Cervical Spine W Wo Contrast  Result Date: 01/12/2017 CLINICAL DATA:  Lower extremity abnormal sensation in weakness beginning 3 days ago which is worsening. EXAM: MRI CERVICAL SPINE WITHOUT AND WITH CONTRAST TECHNIQUE: Multiplanar and multiecho pulse sequences of the cervical spine, to include the craniocervical junction and cervicothoracic junction, were obtained without and with intravenous contrast. CONTRAST:  46mL MULTIHANCE GADOBENATE  DIMEGLUMINE 529 MG/ML IV SOLN COMPARISON:  None. FINDINGS: Study Sever's from motion degradation. Alignment: Straightening of the normal cervical lordosis. Vertebrae: No fracture or primary bone lesion. Some discogenic endplate edema at [id number] could contribute to neck pain. Cord: No cord compression or primary cord lesion. Posterior Fossa, vertebral arteries, paraspinal tissues: Negative Disc levels: Foramen magnum, C1-2 and C2-3: Unremarkable. Facet arthropathy at C2-3 right more than left but without stenosis. C3-4: Mild bulging of the disc. No central canal stenosis. Facet arthropathy right more than left. No neural compression. C4-5: Endplate osteophytes. No compressive narrowing of the canal or foramina. C5-6: Spondylosis with endplate osteophytes that efface the ventral subarachnoid space. AP diameter of the canal is 8 mm. No actual cord compression. Mild foraminal stenosis bilaterally. C6-7: Spondylosis with endplate osteophytes and bulging of the disc. Narrowing of the ventral subarachnoid space but no cord compression. Foramina sufficiently patent. C7-T1:  Mild bulging of the disc.  No neural compression. IMPRESSION: Chronic degenerative changes of the cervical spine with spondylosis from C3-4 through C6-7. No cord compression or primary cord lesion. Canal narrowing at C5-6 with AP diameter of 8 mm. The subarachnoid space is effaced but the cord is not compressed. Foraminal narrowing at this level could possibly affect either or both C6 nerve  roots. Electronically Signed   By: Nelson Chimes M.D.   On: 01/12/2017 16:27   Mr Thoracic Spine W Wo Contrast  Result Date: 01/12/2017 CLINICAL DATA:  bilateral lower extremity weakness and paresthesia EXAM: MRI THORACIC SPINE WITH and without  CONTRAST 15 cc MultiHance TECHNIQUE: Multiplanar, multisequence MR imaging of the thoracic spine was performed following the administration of intravenous contrast. COMPARISON:  None. FINDINGS: Alignment:  Mild thoracic curvature convex to the left. Vertebrae: No fracture or primary bone lesion in the thoracic region. Cord:  No cord compression or primary cord lesion. Paraspinal and other soft tissues: Negative Disc levels: No degenerative disc disease in the thoracic region. No bulge or herniation. No canal or foraminal stenosis. IMPRESSION: Negative MRI of the thoracic spine.  No stenosis.  No cord lesion. Electronically Signed   By: Nelson Chimes M.D.   On: 01/12/2017 16:34   Mr Lumbar Spine W Wo Contrast  Result Date: 01/12/2017 CLINICAL DATA:  Lower extremity decreased sensation an weakness. EXAM: MRI LUMBAR SPINE WITHOUT AND WITH CONTRAST TECHNIQUE: Multiplanar and multiecho pulse sequences of the lumbar spine were obtained without and with intravenous contrast. CONTRAST:  80mL MULTIHANCE GADOBENATE DIMEGLUMINE 529 MG/ML IV SOLN COMPARISON:  10/18/2016 FINDINGS: Segmentation:  L5 is transitional, as described previously. Alignment:  Curvature convex to the right with the apex at L1. Vertebrae:  No fracture or primary bone lesion. Conus medullaris: Extends to the L1 level and appears normal. Paraspinal and other soft tissues: Negative Disc levels: L1-2: Mild bulging of the disc.  No stenosis or neural compression. L2-3: Moderate bulging of the disc. Facet and ligamentous hypertrophy. Mild multifactorial stenosis. Some potential for neural compression in the lateral recesses, right more than left. L3-4: Endplate osteophytes and bulging of the disc. Facet and  ligamentous hypertrophy. Narrowing of the lateral recesses that could cause neural compression, right more than left. L4-5: Bilateral facet arthropathy with anterolisthesis of 7 mm. Bulging of the disc. Narrowing of the lateral recesses and neural foramina right more than left. Neural compression could occur at this level, particularly on the right. L5-S1: Transitional and unremarkable. No apparent change since November. IMPRESSION: No change since November 2017. Transitional anatomy with L5 transitional. Bilateral facet  arthropathy at L4-5 with anterolisthesis of 7 mm. Stenosis of the lateral recesses and foramina right more than left. Neural compression could occur at this level, particularly on the right. Lateral recess stenosis at L2-3 and L3-4 that could possibly cause neural compression. Electronically Signed   By: Nelson Chimes M.D.   On: 01/12/2017 16:32   Dg Fluoro Guide Lumbar Puncture  Result Date: 01/13/2017 CLINICAL DATA:  Sudden onset of inability to walk. Possible Guillain-Barre syndrome. EXAM: DIAGNOSTIC LUMBAR PUNCTURE UNDER FLUOROSCOPIC GUIDANCE FLUOROSCOPY TIME:  Fluoroscopy Time:  0 minutes and 42 seconds Radiation Exposure Index (if provided by the fluoroscopic device): 4.1 mGy Number of Acquired Spot Images: 0 PROCEDURE: Informed consent was obtained from the patient prior to the procedure, including potential complications of headache, allergy, and pain. With the patient prone, the lower back was prepped with Betadine. 1% Lidocaine was used for local anesthesia. Lumbar puncture was performed at the L3-4 level using a 20 gauge needle with return of clear CSF. 8.5 Ml of CSF were obtained for laboratory studies. The patient tolerated the procedure well and there were no apparent complications. IMPRESSION: Fluoroscopic guided lumbar puncture with 8.5 cc clear CSF obtained for appropriate laboratory evaluation. Electronically Signed   By: Marijo Sanes M.D.   On: 01/13/2017 16:49     LOS: 2  days   Rose Clousing, PAS  [employer]   If 7PM-7AM, please contact night-coverage [email or web address] Password [facility] 01/15/2017, 8:50 AM  Attending MD note  Patient was seen, examined,treatment plan was discussed with the PA-S.  I have personally reviewed the clinical findings, lab, imaging studies and management of this patient in detail. I agree with the documentation, as recorded by the PA-S.   Patient is essentially unchanged-she may have developed a slight right facial droop.  On Exam: Gen. exam: Awake, alert, not in any distress Chest: Good air entry bilaterally, no rhonchi or rales CVS: S1-S2 regular, no murmurs Abdomen: Soft, nontender and nondistended Neurology: Non-focal Skin: No rash or lesions  Impression: Guillain-Barr syndrome on IVIG Hyponatremia-patient does have poor oral intake, but her sodium levels were within normal range. I'm not sure this is related to IVIG or her getting D5 with IVIG. Have discussed with pharmacy, they will evaluate and let us know. Leukocytosis-afebrile-check UA, chest x-ray and blood cultures. GERD-starting PPI  Plan Start normal saline at 75 mL an hour See above regarding discussion with pharmacy Continue IVIG-spoke with Dr. Danielle Rankin the phone Continue PPI Continue to monitor closely  Rest as above  [facility]

## 2017-01-15 NOTE — Progress Notes (Signed)
Pt performed NIF and VC with good effort.  NIF -42 VC 777ml.  RT will continue to monitor. Speech at bedside also at this time,.

## 2017-01-15 NOTE — Progress Notes (Signed)
eLink Physician-Brief Progress Note Patient Name: Joanne Morrison DOB: 1951-10-21 MRN: [id number]   Date of Service  01/15/2017  HPI/Events of Note  Acute severe hyponatremia 137 --> 120 over 24h  eICU Interventions  Start 3% saline @50 /h with goal to raise Na by 4 meq in first few hours & by 12 over next 24h Renal informed     Intervention Category Major Interventions: Electrolyte abnormality - evaluation and management  Youcef Klas V. 01/15/2017, 5:46 PM

## 2017-01-15 NOTE — NC FL2 (Signed)
[place] LEVEL OF CARE SCREENING TOOL     IDENTIFICATION  Patient Name: Joanne Morrison Birthdate: 1951/07/30 Sex: female Admission Date (Current Location): 01/12/2017  [facility] and [place] Number:  Herbalist and Address:  [facility] [place], [place] [address], [place], [place] [address]      Provider Number: [id number]  Attending Physician Name and Address:  Jonetta Osgood, MD  Relative Name and Phone Number:       Current Level of Care: Hospital Recommended Level of Care: [place] Prior Approval Number:    Date Approved/Denied:   PASRR Number:   [id number]   Discharge Plan: SNF    Current Diagnoses: Patient Active Problem List   Diagnosis Date Noted  . Quadriplegia and quadriparesis ([place]) 01/14/2017  . Guillain Barr syndrome ([place]) 01/13/2017  . Weakness 01/12/2017  . Hypokalemia 01/12/2017  . Endometriosis 12/05/2013  . Chronic low back pain 08/19/2012  . HYPERLIPIDEMIA 12/10/2010  . COLONIC POLYPS, HX OF 12/13/2009  . HYPERGLYCEMIA, FASTING 11/22/2008  . Depression 02/07/2008  . ANOSMIA 02/07/2008  . HEADACHE, CHRONIC 02/07/2008  . MITRAL VALVE PROLAPSE 10/05/2007  . OSTEOPENIA 10/05/2007    Orientation RESPIRATION BLADDER Height & Weight     Self, Time, Situation, Place  Normal Continent Weight: 71.5 kg (157 lb 10.1 oz) Height:  5\' 2"  (157.5 cm)  BEHAVIORAL SYMPTOMS/MOOD NEUROLOGICAL BOWEL NUTRITION STATUS      Continent Diet (Please see DC Summary)  AMBULATORY STATUS COMMUNICATION OF NEEDS Skin   Extensive Assist Verbally Normal                       Personal Care Assistance Level of Assistance  Bathing, Feeding, Dressing Bathing Assistance: Maximum assistance Feeding assistance: Independent Dressing Assistance: Limited assistance     Functional Limitations Info             SPECIAL CARE FACTORS FREQUENCY  PT (By licensed PT), OT (By licensed OT)     PT Frequency:  5x/week OT Frequency: 2x/week            Contractures      Additional Factors Info  Code Status, Allergies, Psychotropic Code Status Info: Full Allergies Info: Olanzapine, Sumatriptan, Ustell Uretron D-s Psychotropic Info: Wellbutrin; Cymbalta;Ambien         Current Medications (01/15/2017):  This is the current hospital active medication list Current Facility-Administered Medications  Medication Dose Route Frequency Provider Last Rate Last Dose  . 0.9 %  sodium chloride infusion   Intravenous Continuous Jonetta Osgood, MD 75 mL/hr at 01/15/17 [phone]    . acetaminophen (TYLENOL) tablet 650 mg  650 mg Oral Q6H PRN Shanker Kristeen Mans, MD       Or  . acetaminophen (TYLENOL) suppository 650 mg  650 mg Rectal Q6H PRN Shanker Kristeen Mans, MD      . albuterol (PROVENTIL) (2.5 MG/3ML) 0.083% nebulizer solution 2.5 mg  2.5 mg Nebulization Q2H PRN Jonetta Osgood, MD      . aspirin EC tablet 325 mg  325 mg Oral Daily Jonetta Osgood, MD   325 mg at 01/15/17 1055  . buPROPion (WELLBUTRIN XL) 24 hr tablet 150 mg  150 mg Oral q morning - 10a Jonetta Osgood, MD   150 mg at 01/15/17 1055  . diazepam (VALIUM) tablet 10 mg  10 mg Oral Q12H PRN Jonetta Osgood, MD   10 mg at 01/13/17 2150  . DULoxetine (CYMBALTA) DR  capsule 30 mg  30 mg Oral Daily Jonetta Osgood, MD   30 mg at 01/15/17 1055  . fluconazole (DIFLUCAN) tablet 200 mg  200 mg Oral Daily Jonetta Osgood, MD   200 mg at 01/15/17 1055  . gabapentin (NEURONTIN) capsule 400 mg  400 mg Oral TID Jonetta Osgood, MD   400 mg at 01/15/17 1055  . hydrALAZINE (APRESOLINE) injection 10 mg  10 mg Intravenous Q6H PRN Jonetta Osgood, MD      . HYDROcodone-acetaminophen (NORCO/VICODIN) 5-325 MG per tablet 1 tablet  1 tablet Oral Q6H PRN Jonetta Osgood, MD   1 tablet at 01/14/17 2132  . Immune Globulin 10% (PRIVIGEN) IV infusion 30 g  400 mg/kg Intravenous Q24 Hr x 5 Gardiner Barefoot, NP 35.8 mL/hr at 01/15/17 0617 30 g at 01/15/17  0617  . lidocaine (LIDODERM) 5 % 1 patch  1 patch Transdermal Q24H Jonetta Osgood, MD   1 patch at 01/14/17 1600  . LORazepam (ATIVAN) injection 1 mg  1 mg Intravenous Q6H PRN Jonetta Osgood, MD   1 mg at 01/15/17 1055  . losartan (COZAAR) tablet 25 mg  25 mg Oral Daily Jonetta Osgood, MD   25 mg at 01/15/17 1055  . morphine 2 MG/ML injection 2 mg  2 mg Intravenous Q4H PRN Jonetta Osgood, MD   2 mg at 01/15/17 0553  . omega-3 acid ethyl esters (LOVAZA) capsule 1 g  1 g Oral Daily Jonetta Osgood, MD   1 g at 01/15/17 1055  . ondansetron (ZOFRAN) tablet 4 mg  4 mg Oral Q6H PRN Shanker Kristeen Mans, MD       Or  . ondansetron (ZOFRAN) injection 4 mg  4 mg Intravenous Q6H PRN Jonetta Osgood, MD      . pantoprazole (PROTONIX) injection 40 mg  40 mg Intravenous Q24H Jonetta Osgood, MD   40 mg at 01/15/17 1054  . sodium chloride flush (NS) 0.9 % injection 3 mL  3 mL Intravenous Q12H Jonetta Osgood, MD   3 mL at 01/14/17 2200  . vitamin C (ASCORBIC ACID) tablet 1,000 mg  1,000 mg Oral Daily Jonetta Osgood, MD   1,000 mg at 01/15/17 1055  . zolpidem (AMBIEN) tablet 5 mg  5 mg Oral QHS Jonetta Osgood, MD   5 mg at 01/14/17 2132     Discharge Medications: Please see discharge summary for a list of discharge medications.  Relevant Imaging Results:  Relevant Lab Results:   Additional Information SSN: [place], [place]

## 2017-01-15 NOTE — Progress Notes (Signed)
Informed by RN-that patient now having some difficultly with secretions-seems to have more right sided facial weakness than this morning. During my eval-not in any distress-no issues with secretions. Case d/w Neuro at bedside-since now has started to proximal muscle weakness-will transfer down to SDU atleast. Will ask RT to perform NIF/VC again. Check ABG as well. PCCM formally consulted. Spouse updated at bedside.

## 2017-01-15 NOTE — Procedures (Signed)
Intubation Procedure Note Joanne Morrison [id number] May 08, 1951  Procedure: Intubation Indications: Airway protection and maintenance  Procedure Details Consent: Risks of procedure as well as the alternatives and risks of each were explained to the (patient/caregiver).  Consent for procedure obtained. Time Out: Verified patient identification, verified procedure, site/side was marked, verified correct patient position, special equipment/implants available, medications/allergies/relevent history reviewed, required imaging and test results available.  Performed  Maximum sterile technique was used including gloves, hand hygiene and mask.  MAC    Evaluation Hemodynamic Status: BP stable throughout; O2 sats: stable throughout Patient's Current Condition: stable Complications: No apparent complications Patient did tolerate procedure well. Chest X-ray ordered to verify placement.  CXR: pending.   Jennet Maduro 01/15/2017

## 2017-01-15 NOTE — Progress Notes (Signed)
Subjective: Continues to decline. Reports that she is choking.   Exam: Vitals:   01/15/17 0936 01/15/17 1359  BP: (!) 174/84 (!) 186/102  Pulse: 90 (!) 112  Resp: 20 20  Temp: 98 F (36.7 C)    Gen: In bed, NAD Resp: non-labored breathing, no acute distress Abd: soft, nt  Neuro: MS: Awake, alert, interactive and appropriate CN: Extra ocular movements intact Motor: She has diffuse 4-/5 strength in the upper extremities, 3/5 proximally in the legs.  She does have slightly more weakness in the left arm than right arm Sensory: Decreased to vibration ot the knees bilaterally.  DTR: She has absent reflexes diffusely today.   Pertinent Labs: Na 122  Impression: 66 year old female with progressive weakness and numbness and decreased reflexes in the setting of albuminocytologic dissociation. By far, this is most consistent with Barr syndrome and she is currently receiving treatment for this with IVIG, status post a single treatment. I typically start to see improvement after about the third dose of IVIG.  Her hyponatremia is more likley to be directly related to GBS rather than as a complication of IVIG. SIADH can be a complicating factor with GBS.   Recommendations: 1)  IVIG,  day 3 2) CCM consult 3) will check repeat NA, if continues to be hyponatremic, may need to consider changing to PLEX.   Roland Rack, MD [facility] [phone]  If 7pm- 7am, please page neurology on call as listed in [place].

## 2017-01-15 NOTE — Progress Notes (Signed)
RT to pt bedside for stat ABG ordered by hospitalist. CCM at bedside, no need for ABG at this time. Pt to be intubated now. RT transported ventilator and intubation supplies to 4N15 and bedside report given to receiving RT. RT will continue to closely monitor.

## 2017-01-15 NOTE — Evaluation (Signed)
Clinical/Bedside Swallow Evaluation Patient Details  Name: Joanne Morrison MRN: [id number] Date of Birth: 08/13/1951  Today's Date: 01/15/2017 Time: SLP Start Time (ACUTE ONLY): 1420 SLP Stop Time (ACUTE ONLY): 1444 SLP Time Calculation (min) (ACUTE ONLY): 24 min  Past Medical History:  Past Medical History:  Diagnosis Date  . Anxiety   . Arthritis    back  . Chronic cystitis   . Chronic low back pain   . Depression   . Diverticulosis of colon   . Hemorrhoids   . History of colon polyps    09-24-2009  rectal hyperplastic polyp/   and 2016 non-cancerous  . History of endometriosis   . History of panic attacks   . Hyperlipidemia   . Osteopenia   . Pelvic pain   . Sensation of pressure in bladder area   . Wears glasses    Past Surgical History:  Past Surgical History:  Procedure Laterality Date  . BUNIONECTOMY Left 2004 approx  . CARPAL TUNNEL RELEASE Bilateral right 2014;  left 2007  . COLONOSCOPY W/ POLYPECTOMY  09/24/2009  . CYSTO WITH HYDRODISTENSION [name] 10/15/2016   Procedure: CYSTOSCOPY/HYDRODISTENSION AND MARCAINE INSTILLATION;  Surgeon: Rana Snare, MD;  Location: [facility];  Service: Urology;  Laterality: [name];  . CYSTO/  HYDRODISTENTION/  INSTILLATION THERAPY  1997 approx  . HYSTEROSCOPY W/D&C  11/01/2004   POLYPECTOMY  . LAPAROSCOPIC VAGINAL HYSTERECTOMY WITH SALPINGO OOPHORECTOMY Bilateral 04/20/2012  . TUBAL LIGATION  yrs ago   HPI:  Patient is a 66 y.o. female with medical history significant of chronic low back pain, anxiety, depression, hypertension-recently had sorethroat (+strep) presented to the ED on 1/29 with lower ext weakness,tingling numbess that started in the lower extremities and subsequently started involving her upper extremities. Unable to elicit DTR in the lower ext. MRI of the entire spine negative. 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   Assessment / Plan / Recommendation Clinical Impression  Pt describes  increased difficulty swallowing today. With minimal amounts of thin liquids, she has multiple swallows, audible wetness, and immediate coughing. Both her reflexive and volitional coughing are weak. Pt then abruptly has regurgitation of water just consumed, with most of it spilling from her oral cavity and the rest being orally suctioned by SLP. Recommend to remain NPO pending signs of clinical improvement, at which time she will likely need MBS. Until then, would focus on thorough oral care and keep her sitting upright as much as possible.     Aspiration Risk  Severe aspiration risk;Moderate aspiration risk    Diet Recommendation NPO   Medication Administration: Via alternative means    Other  Recommendations Oral Care Recommendations: Oral care QID Other Recommendations: Have oral suction available   Follow up Recommendations  (tba)      Frequency and Duration min 2x/week  2 weeks       Prognosis Prognosis for Safe Diet Advancement: Good      Swallow Study   General HPI: Patient is a 66 y.o. female with medical history significant of chronic low back pain, anxiety, depression, hypertension-recently had sorethroat (+strep) presented to the ED on 1/29 with lower ext weakness,tingling numbess that started in the lower extremities and subsequently started involving her upper extremities. Unable to elicit DTR in the lower ext. MRI of the entire spine negative. 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 Type of Study: Bedside Swallow Evaluation Previous Swallow Assessment: none in chart Diet Prior to this Study: NPO (made NPO today due to  difficulty swallowing) Temperature Spikes Noted: No Respiratory Status: Room air History of Recent Intubation: No Behavior/Cognition: Alert;Cooperative;Pleasant mood Oral Cavity Assessment: Within Functional Limits Oral Care Completed by SLP: No Oral Cavity - Dentition: Adequate natural dentition Patient Positioning: Upright in  bed Baseline Vocal Quality: Normal Volitional Cough: Weak Volitional Swallow: Able to elicit    Oral/Motor/Sensory Function Overall Oral Motor/Sensory Function: Moderate impairment Facial ROM: Reduced left;Suspected CN VII (facial) dysfunction Facial Symmetry: Abnormal symmetry left;Suspected CN VII (facial) dysfunction Facial Strength: Reduced left;Suspected CN VII (facial) dysfunction Lingual ROM: Within Functional Limits Lingual Symmetry: Within Functional Limits Lingual Strength: Within Functional Limits Velum: Within Functional Limits Mandible: Within Functional Limits   Ice Chips Ice chips: Impaired Presentation: Spoon Pharyngeal Phase Impairments: Multiple swallows   Thin Liquid Thin Liquid: Impaired Presentation: Cup;Self Fed;Spoon Pharyngeal  Phase Impairments: Multiple swallows;Cough - Immediate (regurgitation)    Nectar Thick Nectar Thick Liquid: Not tested   Honey Thick Honey Thick Liquid: Not tested   Puree Puree: Not tested   Solid   GO   Solid: Not tested        Germain Osgood 01/15/2017,3:04 PM  Germain Osgood, M.A. CCC-SLP [phone]

## 2017-01-15 NOTE — Clinical Social Work Note (Signed)
Clinical Social Work Assessment  Patient Details  Name: Joanne Morrison MRN: [id number] Date of Birth: 1951-03-09  Date of referral:  01/15/17               Reason for consult:  Facility Placement                Permission sought to share information with:  Facility Sport and exercise psychologist, Family Supports Permission granted to share information::  Yes, Verbal Permission Granted  Name::     IT consultant::  SNFs  Relationship::  Spouse  Contact Information:  [phone]  Housing/Transportation Living arrangements for the past 2 months:  [place] of Information:  Patient, Spouse Patient Interpreter Needed:  None Criminal Activity/Legal Involvement Pertinent to Current Situation/Hospitalization:  No - Comment as needed Significant Relationships:  Spouse Lives with:  Spouse Do you feel safe going back to the place where you live?  No Need for family participation in patient care:  Yes (Comment)  Care giving concerns:  CSW received consult for possible SNF placement at time of discharge. CSW spoke with patient and patient's husband regarding PT recommendation of SNF placement at time of discharge if [facility] is not able to admit. Patient reported that patient's spouse is currently unable to care for patient at their home given patient's current physical needs and fall risk. Patient expressed understanding of PT recommendation and is agreeable to SNF placement at time of discharge only if [facility] is unable to admit her. CSW to continue to follow and assist with discharge planning needs.   Social Worker assessment / plan:  CSW spoke with patient concerning possibility of rehab at [facility] before returning home if [facility] is unable.  Employment status:  Retired Health visitor, Managed Care PT Recommendations:  Inpatient [facility] / Referral to community resources:  [place]  Patient/Family's Response to care:  Patient recognizes need for  rehab before returning home and is agreeable to a SNF in [place]. Patient's husband expressed emotion when discussing the patient's future and would prefer for patient to go to [facility]. 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   Patient/Family's Understanding of and Emotional Response to Diagnosis, Current Treatment, and Prognosis:  Patient/family is realistic regarding therapy needs and expressed being hopeful for CIR placement. Patient expressed understanding of CSW role and discharge process. No questions/concerns about plan or treatment.    Emotional Assessment Appearance:  Appears stated age Attitude/Demeanor/Rapport:  Other (Appropriate) Affect (typically observed):  Accepting, Appropriate, Quiet Orientation:  Oriented to Self, Oriented to Situation, Oriented to Place, Oriented to  Time Alcohol / Substance use:  Not Applicable Psych involvement (Current and /or in the community):  No (Comment)  Discharge Needs  Concerns to be addressed:  Care Coordination Readmission within the last 30 days:  No Current discharge risk:  None Barriers to Discharge:  Continued Medical Work up   [employer], [place] 01/15/2017, 1:34 PM

## 2017-01-15 NOTE — Progress Notes (Signed)
Pharmacy Antibiotic Note  Petula Delvecchio is a 66 y.o. female admitted on 01/12/2017 with aspiration Pna  Plan: unasyn 3 g q8h  Height: 5\' 2"  (157.5 cm) Weight: 157 lb 10.1 oz (71.5 kg) IBW/kg (Calculated) : 50.1  Temp (24hrs), Avg:98.4 F (36.9 C), Min:98 F (36.7 C), Max:98.7 F (37.1 C)   Recent Labs Lab 01/11/17 2226 01/12/17 1204 01/12/17 1225 01/13/17 0328 01/15/17 0404 01/15/17 1030  WBC 10.0 11.5*  --  11.9* 22.3*  --   CREATININE 1.65* 1.14*  --  0.95 0.72 0.75  LATICACIDVEN  --   --  2.22*  --   --   --     Estimated Creatinine Clearance: 65.8 mL/min (by C-G formula based on SCr of 0.75 mg/dL).    Allergies  Allergen Reactions  . Olanzapine Other (See Comments)    STIFF JOINTS, EXTREMITY WEAKNESS, MEMORY LOSS, LOSS OF BALANCE  . Sumatriptan Other (See Comments)    REACTION: altered mental status  . Graciella Freer D-S] Other (See Comments)    lethargic   Levester Fresh, PharmD, BCPS, BCCCP Clinical Pharmacist 01/15/2017 3:52 PM

## 2017-01-15 NOTE — Procedures (Signed)
Central Venous Catheter Insertion Procedure Note Ovie Romanos [id number] Jul 27, 1951  Procedure: Insertion of Central Venous Catheter Indications: Assessment of intravascular volume, Drug and/or fluid administration and Frequent blood sampling  Procedure Details Consent: Risks of procedure as well as the alternatives and risks of each were explained to the (patient/caregiver).  Consent for procedure obtained. Time Out: Verified patient identification, verified procedure, site/side was marked, verified correct patient position, special equipment/implants available, medications/allergies/relevent history reviewed, required imaging and test results available.  Performed  Maximum sterile technique was used including antiseptics, cap, gloves, gown, hand hygiene, mask and sheet. Skin prep: Chlorhexidine; local anesthetic administered A antimicrobial bonded/coated triple lumen catheter was placed in the right internal jugular vein using the Seldinger technique.  Evaluation Blood flow good Complications: No apparent complications Patient did tolerate procedure well. Chest X-ray ordered to verify placement.  CXR: pending.  U/S used in placement.  YACOUB,WESAM 01/15/2017, 4:12 PM

## 2017-01-16 DIAGNOSIS — J9600 Acute respiratory failure, unspecified whether with hypoxia or hypercapnia: Secondary | ICD-10-CM | POA: Diagnosis not present

## 2017-01-16 DIAGNOSIS — I959 Hypotension, unspecified: Secondary | ICD-10-CM | POA: Diagnosis not present

## 2017-01-16 DIAGNOSIS — E871 Hypo-osmolality and hyponatremia: Secondary | ICD-10-CM | POA: Diagnosis not present

## 2017-01-16 DIAGNOSIS — L8990 Pressure ulcer of unspecified site, unspecified stage: Secondary | ICD-10-CM | POA: Insufficient documentation

## 2017-01-16 DIAGNOSIS — G610 Guillain-Barre syndrome: Secondary | ICD-10-CM | POA: Diagnosis not present

## 2017-01-16 LAB — GLUCOSE, CAPILLARY
Glucose-Capillary: 129 mg/dL — ABNORMAL HIGH (ref 65–99)
Glucose-Capillary: 134 mg/dL — ABNORMAL HIGH (ref 65–99)
Glucose-Capillary: 138 mg/dL — ABNORMAL HIGH (ref 65–99)
Glucose-Capillary: 154 mg/dL — ABNORMAL HIGH (ref 65–99)
Glucose-Capillary: 92 mg/dL (ref 65–99)
Glucose-Capillary: 97 mg/dL (ref 65–99)

## 2017-01-16 LAB — SODIUM
SODIUM: 130 mmol/L — AB (ref 135–145)
SODIUM: 131 mmol/L — AB (ref 135–145)
SODIUM: 120 mmol/L — AB (ref 135–145)

## 2017-01-16 LAB — MRSA PCR SCREENING: MRSA BY PCR: NEGATIVE

## 2017-01-16 LAB — BASIC METABOLIC PANEL
Anion gap: 9 (ref 5–15)
BUN: 18 mg/dL (ref 6–20)
CALCIUM: 8.1 mg/dL — AB (ref 8.9–10.3)
CO2: 21 mmol/L — AB (ref 22–32)
CREATININE: 1.23 mg/dL — AB (ref 0.44–1.00)
Chloride: 101 mmol/L (ref 101–111)
GFR calc non Af Amer: 45 mL/min — ABNORMAL LOW (ref >60)
GFR, EST AFRICAN AMERICAN: 53 mL/min — AB (ref >60)
Glucose, Bld: 120 mg/dL — ABNORMAL HIGH (ref 65–99)
Potassium: 4 mmol/L (ref 3.5–5.1)
SODIUM: 131 mmol/L — AB (ref 135–145)

## 2017-01-16 LAB — POCT I-STAT 3, ART BLOOD GAS (G3+)
ACID-BASE DEFICIT: 7 mmol/L — AB (ref 0.0–2.0)
Acid-base deficit: 7 mmol/L — ABNORMAL HIGH (ref 0.0–2.0)
BICARBONATE: 21.2 mmol/L (ref 20.0–28.0)
Bicarbonate: 18.4 mmol/L — ABNORMAL LOW (ref 20.0–28.0)
O2 SAT: 92 %
O2 Saturation: 94 %
PCO2 ART: 36.7 mmHg (ref 32.0–48.0)
PH ART: 7.201 — AB (ref 7.350–7.450)
Patient temperature: 98.6
TCO2: 23 mmol/L (ref 0–100)
TCO2: 20 mmol/L (ref 0–100)
pCO2 arterial: 54.5 mmHg — ABNORMAL HIGH (ref 32.0–48.0)
pH, Arterial: 7.308 — ABNORMAL LOW (ref 7.350–7.450)
pO2, Arterial: 90 mmHg (ref 83.0–108.0)
pO2, Arterial: 68 mmHg — ABNORMAL LOW (ref 83.0–108.0)

## 2017-01-16 LAB — CSF CULTURE W GRAM STAIN: Culture: NO GROWTH

## 2017-01-16 LAB — MAGNESIUM
Magnesium: 1.7 mg/dL (ref 1.7–2.4)
Magnesium: 2.3 mg/dL (ref 1.7–2.4)

## 2017-01-16 LAB — NA AND K (SODIUM & POTASSIUM), RAND UR
Potassium Urine: 22 mmol/L
Sodium, Ur: 97 mmol/L

## 2017-01-16 LAB — PHOSPHORUS
PHOSPHORUS: 2.5 mg/dL (ref 2.5–4.6)
Phosphorus: 3.2 mg/dL (ref 2.5–4.6)

## 2017-01-16 LAB — OSMOLALITY, URINE: OSMOLALITY UR: 352 mosm/kg (ref 300–900)

## 2017-01-16 MED ORDER — SODIUM CHLORIDE 0.9 % IV SOLN
INTRAVENOUS | Status: DC
  Administered 2017-01-16 – 2017-01-17 (×3): via INTRAVENOUS

## 2017-01-16 MED ORDER — CHLORHEXIDINE GLUCONATE CLOTH 2 % EX PADS
6.0000 | MEDICATED_PAD | Freq: Every day | CUTANEOUS | Status: DC
  Administered 2017-01-17 – 2017-01-26 (×10): 6 via TOPICAL

## 2017-01-16 MED ORDER — SODIUM CHLORIDE 0.9 % IV BOLUS (SEPSIS)
500.0000 mL | Freq: Once | INTRAVENOUS | Status: AC
  Administered 2017-01-16: 500 mL via INTRAVENOUS

## 2017-01-16 MED ORDER — MAGNESIUM SULFATE 2 GM/50ML IV SOLN
2.0000 g | Freq: Once | INTRAVENOUS | Status: AC
  Administered 2017-01-16: 2 g via INTRAVENOUS
  Filled 2017-01-16: qty 50

## 2017-01-16 MED ORDER — SODIUM CHLORIDE 0.9% FLUSH: 10.0000 mL | INTRAVENOUS | Status: DC | PRN

## 2017-01-16 MED ORDER — MIDAZOLAM HCL 2 MG/2ML IJ SOLN: 1.0000 mg | INTRAMUSCULAR | Status: DC | PRN

## 2017-01-16 MED ORDER — SODIUM CHLORIDE 0.9% FLUSH
10.0000 mL | Freq: Two times a day (BID) | INTRAVENOUS | Status: DC
  Administered 2017-01-16 – 2017-01-22 (×12): 10 mL
  Administered 2017-01-23: 30 mL
  Administered 2017-01-23: 10 mL
  Administered 2017-01-24: 30 mL
  Administered 2017-01-24 – 2017-01-25 (×2): 10 mL
  Administered 2017-01-25: 30 mL
  Administered 2017-01-26 – 2017-01-30 (×8): 10 mL

## 2017-01-16 MED ORDER — FUROSEMIDE 10 MG/ML IJ SOLN
20.0000 mg | Freq: Three times a day (TID) | INTRAMUSCULAR | Status: DC
  Administered 2017-01-16: 20 mg via INTRAVENOUS
  Filled 2017-01-16: qty 2

## 2017-01-16 MED ORDER — DEXTROSE 5 % IV SOLN
0.0000 ug/min | INTRAVENOUS | Status: DC
  Administered 2017-01-16: 2 ug/min via INTRAVENOUS
  Filled 2017-01-16 (×2): qty 4

## 2017-01-16 MED ORDER — DEXTROSE 5 % IV SOLN
30.0000 mmol | Freq: Once | INTRAVENOUS | Status: AC
  Administered 2017-01-16: 30 mmol via INTRAVENOUS
  Filled 2017-01-16: qty 10

## 2017-01-16 NOTE — Progress Notes (Signed)
Name: Joanne Morrison MRN: [id number] DOB: [birth year]    ADMISSION DATE:  01/12/2017 CONSULTATION DATE:  2/1  REFERRING MD :  Ghimire ([facility])   CHIEF COMPLAINT:  Dyspnea, potential for respiratory compromise   BRIEF PATIENT DESCRIPTION: 66yo female with hx chronic cystitis, depression/anxiety who presented 1/29 with L>R BLE weakness, LUE weakness and worsening myalgias x 3-4 days.  She was recently dx with strep pharyngitis and completed a course of zithromax.   She was seen in consultation by neurology and felt her s/s were most c/w guillain barre. She was started on IVIG 1/30.  On 2/1 she developed increased weakness, worsening NIF and difficulty with secretions and PCCM consulted with concern for potential deterioration of her respiratory status.      SUBJECTIVE:  Level of sedation preventing NIF  Na stable now   VITAL SIGNS:   Temp:  [97.9 F (36.6 C)-99.7 F (37.6 C)] 97.9 F (36.6 C) (02/03 0758) Pulse Rate:  [82-123] 83 (02/03 0725) Resp:  [20-24] 20 (02/03 0725) BP: (70-184)/(46-110) 101/56 (02/03 0700) SpO2:  [92 %-98 %] 96 % (02/03 0725) FiO2 (%):  [50 %-60 %] 50 % (02/03 0725) Weight:  [156 lb 8.4 oz (71 kg)] 156 lb 8.4 oz (71 kg) (02/03 0500)  HEMODYNAMICS: CVP:  [5 mmHg-7 mmHg] 5 mmHg  VENTILATOR SETTINGS: Vent Mode: PRVC FiO2 (%):  [50 %-60 %] 50 % Set Rate:  [20 bmp-30 bmp] 20 bmp Vt Set:  [450 mL] 450 mL PEEP:  [5 cmH20] 5 cmH20 Plateau Pressure:  [19 cmH20-22 cmH20] 19 cmH20  INTAKE / OUTPUT: I/O last 3 completed shifts: In: 7230 [I.V.:4228.7; NG/GT:1291.3; IV Piggyback:1710] Out: 2390 [Urine:2390]   General appearance:  66 Year old female, well nourished sedated on vent NAD Eyes: anicteric sclerae, moist conjunctivae; PERRL, EOMI bilaterally. Mouth:  membranes and no mucosal ulcerations; normal hard and soft palate Neck: Trachea midline; neck supple, no JVD, orally intubated w/7.5 tube Lungs/chest: CTA, with normal respiratory effort and no  intercostal retractions & normal chest rise on vent  CV: RRR, no MRGs  Abdomen: Soft, non-tender; no masses or HSM Extremities: trace peripheral edema or extremity lymphadenopathy Skin: Normal temperature, turgor and texture; no rash, ulcers or subcutaneous nodules Neuro/Psych: nodes appropriately, seems a little anxious. She has decreased strength t/o. Only able to move her UEs w/ assist LABS:  BMET  Recent Labs Lab 01/15/17 1719  01/16/17 0755  01/16/17 1656 01/17/17 0013 01/17/17 0433  NA 120*  < > 131*  < > 130* 131* 131*  131*  K 3.8  --  4.0  --   --   --  3.6  CL 92*  --  101  --   --   --  104  CO2 20*  --  21*  --   --   --  23  BUN 11  --  18  --   --   --  15  CREATININE 0.58  --  1.23*  --   --   --  0.75  GLUCOSE 125*  --  120*  --   --   --  135*  < > = values in this interval not displayed. Electrolytes  Recent Labs Lab 01/15/17 1719 01/16/17 0755 01/16/17 1656 01/17/17 0433  CALCIUM 8.4* 8.1*  --  8.0*  MG 1.7 1.7 2.3 1.8  PHOS 3.0 2.5 3.2 1.7*   CBC  Recent Labs Lab 01/13/17 0328 01/15/17 0404 01/17/17 0433  WBC 11.9* 22.3* 20.6*  HGB  14.2 14.1 10.2*  HCT 42.2 41.6 30.2*  PLT 355 355 229   Coag's No results for input(s): APTT, INR in the last 168 hours.  Sepsis Markers  Recent Labs Lab 01/12/17 1225  LATICACIDVEN 2.22*   ABG  Recent Labs Lab 01/16/17 0456 01/16/17 0838 01/17/17 0432  PHART 7.201* 7.308* 7.391  PCO2ART 54.5* 36.7 38.4  PO2ART 90.0 68.0* 80.9*   Liver Enzymes  Recent Labs Lab 01/12/17 1204  AST 28  ALT 18  ALKPHOS 46  BILITOT 0.9  ALBUMIN 4.0   Cardiac Enzymes No results for input(s): TROPONINI, PROBNP in the last 168 hours.  Glucose  Recent Labs Lab 01/16/17 1120 01/16/17 1643 01/16/17 1946 01/17/17 0022 01/17/17 0436 01/17/17 0755  GLUCAP 138* 154* 134* 140* 137* 128*   Imaging Dg Chest Port 1 View  Result Date: 01/17/2017 CLINICAL DATA:  66 year old female admitted for lower extremity  weakness, worsening myalgia and inability to walk. Recent strep pharyngitis. Suspected Guillain-Barre. Deterioration of respiratory stress status, subsequently intubated. Possible aspiration. Initial encounter. EXAM: PORTABLE CHEST 1 VIEW COMPARISON:  01/15/2017 and earlier. FINDINGS: Portable AP semi upright view at 0513 hours. Stable endotracheal tube tip at the level the clavicles. Enteric tube courses to the left abdomen, tip not included. Kyphotic positioning. Continued dense and confluent retrocardiac opacity. Increased patchy left perihilar opacity. Mildly increased patchy and veiling right lung opacity. No pneumothorax. No pulmonary edema. Stable cardiac size and mediastinal contours. IMPRESSION: 1.  Stable lines and tubes. 2. Continued left lower lobe collapse or consolidation. Interval worsening left perihilar and right lung base opacity which may represent a combination of airspace disease and small pleural effusion. Suspect bilateral pneumonia which could be the sequelae of aspiration in this clinical setting. Electronically Signed   By: Genevie Ann M.D.   On: 01/17/2017 07:17  my personal eval of CXR:  Remarkable Increase right sided airspace disease. A little worse on left. Suspect does indeed represent aspiration  SIGNIFICANT EVENTS  IVIG 1/30, 1/31, 2/1, 2/2  STUDIES:  MR brain 1/29>>>neg LP 1/30>>>  CULTURES: CSF 1/30>>>neg BC x 2 2/1>>>  Respiratory culture 2/3>>>  ANTIBIOTICS: Unasyn 2/1>>>  LINES/TUBES: ETT 2/1>>> Right IJ CVL 2/1>>>    ASSESSMENT / PLAN:  Neurologic GBS.  Followed by neurology. Getting IVIG Plan Continue PAD protocol Cont IVIG (extended course); if no improvement mid week Neuro considering PLEX  Pulmonary  Acute Hypoxic respiratory failure in the setting of neuro-muscular weakness Aspiration/HCAP Plan Cont full vent support PAD protocol  See ID section   CARDIOVASCULAR Hypovolemia-->improved  Plan IV hydration  Tele  RENAL Hypo-osmolar  Hyponatremia  ->her initial volume status was low. Initial urine osmo 819, UNa 49, and Na was 120. Renal consulted as of 2/2. Responded to IVFs so was actually more likely a appropriate response although can't r/o underlying element of SIADH given it occurs in about 48% people w/ GBS  Hypophosphatemia  Hypomagnesemia  Plan Cont IV hydration  Replace lytes as needed Repeat am chemistry Strict I&O Renal dose meds as needed  GASTROINTESTINAL Dysphagia  Plan Cont tubefeeds Cont SUP (change to VT route)  HEMATOLOGIC Leukocytosis   INFECTIOUS possible aspiration PNA  Oral thrush  Plan Widen ABX for HCAP coverage Cont diflucan  Get sputum   Discussion  67 year old female w/ recent dx of GBS. PCCM asked to see on 2/1 due to progressive neuromuscular decline w/ concern about airway protection as well as severe hypo-osmolar hyponatremia in setting of what was likely volume depletion.  Now improved. She is profoundly weak & also her CXR looks much worse today. Spoke w/ both Neuro and nephro team. Plan of care confirmed.  For today -cont supportive care -ck sputum and widen abx for nosocomial coverage -cont IVIG thru weekend. If no improvement looking at PLEX next week  My critical care time 37 minutes.  Erick Colace ACNP-BC [place] Pager # [phone] OR # [phone] if no answer   ATTENDING NOTE / ATTESTATION NOTE :   I have discussed the case with the resident/APP  Marni Griffon NP  I agree with the resident/APP's  history, physical examination, assessment, and plans.    I have edited the above note and modified it according to our agreed history, physical examination, assessment and plan.   Briefly, 66 year old female w/ recent dx of GBS exacerbation, intubated 2/2 resp failure related to generalized weakness.  Slowly improving. Concern for nosocomial infxn (HCAP). On D4 of IV IG.   Vitals:  Vitals:   01/17/17 1345 01/17/17 1400 01/17/17 1415 01/17/17  1500  BP: 110/65 112/65 133/79 (!) 147/75  Pulse: 79 77 79 86  Resp: 20 20 20 20   Temp:      TempSrc:      SpO2: 98% 98% 99% 99%  Weight:      Height:        Constitutional/General: well-nourished, well-developed, intubated, sedated, not in any distress  Body mass index is 28.63 kg/m. Wt Readings from Last 3 Encounters:  01/17/17 71 kg (156 lb 8.4 oz)  10/15/16 72.7 kg (160 lb 5 oz)  12/05/13 75.7 kg (166 lb 12.8 oz)    HEENT: PERLA, anicteric sclerae. (-) Oral thrush. Intubated, ETT in place  Neck: No masses. Midline trachea. No JVD, (-) LAD. (-) bruits appreciated.  Respiratory/Chest: Grossly normal chest. (-) deformity. (-) Accessory muscle use.  Poor effort Diminished BS on both lower lung zones. (-) wheezing, rhonchi crackels bases (-) egophony  Cardiovascular: Regular rate and  rhythm, heart sounds normal, no murmur or gallops,  Trace peripheral edema  Gastrointestinal:  Normal bowel sounds. Soft, non-tender. No hepatosplenomegaly.  (-) masses.   Musculoskeletal:  Normal muscle tone.   Extremities: Grossly normal. (-) clubbing, cyanosis.  (-) edema  Skin: (-) rash,lesions seen.   Neurological/Psychiatric : sedated, intubated. CN grossly intact. (-) lateralizing signs.   CBC Recent Labs     01/15/17  0404  01/17/17  0433  WBC  22.3*  20.6*  HGB  14.1  10.2*  HCT  41.6  30.2*  PLT  355  229    Coag's No results for input(s): APTT, INR in the last 72 hours.  BMET Recent Labs     01/15/17  1719   01/16/17  0755   01/16/17  1656  01/17/17  0013  01/17/17  0433  NA  120*   < >  131*   < >  130*  131*  131*  131*  K  3.8   --   4.0   --    --    --   3.6  CL  92*   --   101   --    --    --   104  CO2  20*   --   21*   --    --    --   23  BUN  11   --   18   --    --    --  15  CREATININE  0.58   --   1.23*   --    --    --   0.75  GLUCOSE  125*   --   120*   --    --    --   135*   < > = values in this interval not displayed.     Electrolytes Recent Labs     01/15/17  1719  01/16/17  0755  01/16/17  1656  01/17/17  0433  CALCIUM  8.4*  8.1*   --   8.0*  MG  1.7  1.7  2.3  1.8  PHOS  3.0  2.5  3.2  1.7*    Sepsis Markers No results for input(s): PROCALCITON, O2SATVEN in the last 72 hours.  Invalid input(s): LACTICACIDVEN  ABG Recent Labs     01/16/17  0456  01/16/17  0838  01/17/17  0432  PHART  7.201*  7.308*  7.391  PCO2ART  54.5*  36.7  38.4  PO2ART  90.0  68.0*  80.9*    Liver Enzymes No results for input(s): AST, ALT, ALKPHOS, BILITOT, ALBUMIN in the last 72 hours.  Cardiac Enzymes No results for input(s): TROPONINI, PROBNP in the last 72 hours.  Glucose Recent Labs     01/16/17  1643  01/16/17  1946  01/17/17  0022  01/17/17  0436  01/17/17  0755  01/17/17  1211  GLUCAP  154*  134*  140*  137*  128*  163*    Imaging Dg Chest Port 1 View  Result Date: 01/17/2017 CLINICAL DATA:  66 year old female admitted for lower extremity weakness, worsening myalgia and inability to walk. Recent strep pharyngitis. Suspected Guillain-Barre. Deterioration of respiratory stress status, subsequently intubated. Possible aspiration. Initial encounter. EXAM: PORTABLE CHEST 1 VIEW COMPARISON:  01/15/2017 and earlier. FINDINGS: Portable AP semi upright view at 0513 hours. Stable endotracheal tube tip at the level the clavicles. Enteric tube courses to the left abdomen, tip not included. Kyphotic positioning. Continued dense and confluent retrocardiac opacity. Increased patchy left perihilar opacity. Mildly increased patchy and veiling right lung opacity. No pneumothorax. No pulmonary edema. Stable cardiac size and mediastinal contours. IMPRESSION: 1.  Stable lines and tubes. 2. Continued left lower lobe collapse or consolidation. Interval worsening left perihilar and right lung base opacity which may represent a combination of airspace disease and small pleural effusion. Suspect bilateral pneumonia which  could be the sequelae of aspiration in this clinical setting. Electronically Signed   By: Genevie Ann M.D.   On: 01/17/2017 07:17   Dg Chest Port 1 View  Result Date: 01/15/2017 CLINICAL DATA:  Central line placement. EXAM: PORTABLE CHEST 1 VIEW COMPARISON:  Chest x-ray from earlier today. FINDINGS: Endotracheal tube has been placed with tip well positioned less than 2 cm above the carina. Right IJ central line in place with tip adequately positioned at the level of the mid SVC. No pneumothorax appreciated. There is a dense opacity at the left lung base, atelectasis versus pneumonia. Probable small left pleural effusion as well. Right lung remains clear. Elevation the right hemidiaphragm is stable in the short-term interval. Osseous structures about the chest are unremarkable. IMPRESSION: 1. Right IJ central line adequately positioned with tip at the level of the mid SVC. No pneumothorax seen. 2. Endotracheal tube well positioned with tip just above the level of the carina. 3. Dense opacity at the left lung base, atelectasis versus pneumonia, probable small left pleural effusion. Electronically Signed  By: Franki Cabot M.D.   On: 01/15/2017 18:26   Dg Abd Portable 1v  Result Date: 01/15/2017 CLINICAL DATA:  Feeding tube placement. EXAM: PORTABLE ABDOMEN - 1 VIEW COMPARISON:  None. FINDINGS: Feeding tube appears appropriately positioned within the expected region of the stomach body. Visualized bowel gas pattern is nonobstructive. There is a patulous gas-filled loop of bowel within the left abdomen which likely has thickened walls. No evidence of soft tissue mass or abnormal fluid collection. No evidence free intraperitoneal air seen. Mild degenerative change noted within the scoliotic lumbar spine. IMPRESSION: 1. Feeding tube appears appropriately positioned in the left abdomen, expected region of the stomach body. 2. Probable bowel wall thickening in the left abdomen, uncertain if large or small bowel,  suspicious for enteritis or colitis. Electronically Signed   By: Franki Cabot M.D.   On: 01/15/2017 20:52   Assessment/Pan:  GBS exacerbation.  - cont IVIG per Neuro. Day 5 today. Plan for 7 days and assess - keep intubated for resp support  Acute hypoxemic resp fx 2/2 Unable to protect airway + HCAP - send cultures - switch to vanc and zosyn  Hyponatremia - improved - cont IVF   Keep  On heparin sq for dvt prophylaxis.  No family at bedside. Discussed plan with pt.     I spent  30 minutes of Critical Care time with this patient today. This is my time spent independent of the APP or resident.      Monica Becton, MD 01/17/2017, 3:25 PM [facility] Pager [phone] After 3 pm or if no answer, call [phone]

## 2017-01-16 NOTE — Progress Notes (Signed)
NIF-18 VT 34

## 2017-01-16 NOTE — Progress Notes (Signed)
RT called by RN in reference to patient desat.  Upon arrival to room SpO2 86-88% on FiO2 of 50%.  BP also noted to be soft with systolic in the [id number].  LS clear.  Attempted recruitment maneuver, however, SpO2 quickly fell to 79%.  Recruitment was aborted and patient placed back on previous settings with FiO2 increased to 100%.  SpO2 stabilized at 90%.  Elink MD requested PEEP of 10 cmh2o.  Change attempted with drop in SpO2 again.  Patient placed back on 5 cmh2o of PEEP with recovery of SpO2 to 90%.  Findings relayed to [facility] MD.  ABG obtained and results called to [facility] MD.  Changes made per order (VT increase to 430ml.  RR increase to 20).  Arterial line order noted in system from [facility] MD.  Called back to discuss holding off on placement until post fluid administration.  Elink MD agreed.  At time of this note, SpO2 is 98% and BP has normalized after fluid administration.  No vasopressors are being administered, and as such, arterial line insertion is not necessary.  RT will continue to monitor.

## 2017-01-16 NOTE — Progress Notes (Signed)
Pt found with low bp and HR100's sats 80's.  Propofol and Fentanyl stopped. Urine output 451ml for shift. Dr. Jimmy Footman called to make aware and RT called. New orders received will continue to monitor.

## 2017-01-16 NOTE — Progress Notes (Signed)
Name: Joanne Morrison MRN: [id number] DOB: November 23, 1951    ADMISSION DATE:  01/12/2017 CONSULTATION DATE:  2/1  REFERRING MD :  G[facility] ([facility])   CHIEF COMPLAINT:  Dyspnea, potential for respiratory compromise   BRIEF PATIENT DESCRIPTION: 66yo female with hx chronic cystitis, depression/anxiety who presented 1/29 with L>R BLE weakness, LUE weakness and worsening myalgias x 3-4 days.  She was recently dx with strep pharyngitis and completed a course of zithromax.   She was seen in consultation by neurology and felt her s/s were most c/w guillain barre. She was started on IVIG 1/30.  On 2/1 she developed increased weakness, worsening NIF and difficulty with secretions and PCCM consulted with concern for potential deterioration of her respiratory status.        HISTORY OF PRESENT ILLNESS:  66yo female with hx chronic cystitis, depression/anxiety who presented 1/29 with L>R BLE weakness, LUE weakness and worsening myalgias x 3-4 days.  She was recently dx with strep pharyngitis and completed a course of zithromax.    She was seen in consultation by neurology and felt her s/s were most c/w guillain barre. She was started on IVIG 1/30.  On 2/1 she developed increased weakness, worsening NIF and difficulty with secretions and PCCM consulted with concern for potential deterioration of her respiratory status.   Currently denies chest pain, back pain, hemoptysis, headache.  Does feel more SOB.  C/o ongoing generalized weakness, worse in BLE.  New facial weakness/numbness today.     SUBJECTIVE: No events overnight, Na remains low even with 50 ml/hr of 3%.  VITAL SIGNS: Temp:  [98 F (36.7 C)-99.5 F (37.5 C)] 98.1 F (36.7 C) (02/02 0805) Pulse Rate:  [87-115] 115 (02/02 0805) Resp:  [12-23] 22 (02/02 0805) BP: (68-186)/(48-103) 154/91 (02/02 0805) SpO2:  [80 %-100 %] 93 % (02/02 0805) FiO2 (%):  [40 %-100 %] 60 % (02/02 0803) Weight:  [71.6 kg (157 lb 13.6 oz)] 71.6 kg (157 lb 13.6 oz) (02/02  0403)  Recent Labs Lab 01/15/17 0404 01/15/17 1030 01/15/17 1719 01/15/17 2040 01/16/17 0030  NA 122* 120* 120* 123* 120*  K 3.6 3.7 3.8  --   --   CL 90* 90* 92*  --   --   CO2 18* 18* 20*  --   --   BUN 11 12 11   --   --   CREATININE 0.72 0.75 0.58  --   --   GLUCOSE 114* 123* 125*  --   --    Recent Labs Lab 01/12/17 1204 01/13/17 0328 01/15/17 0404  HGB 14.2 14.2 14.1  HCT 42.9 42.2 41.6  WBC 11.5* 11.9* 22.3*  PLT 289 355 355   Dg Chest Port 1 View  Result Date: 01/15/2017 CLINICAL DATA:  Central line placement. EXAM: PORTABLE CHEST 1 VIEW COMPARISON:  Chest x-ray from earlier today. FINDINGS: Endotracheal tube has been placed with tip well positioned less than 2 cm above the carina. Right IJ central line in place with tip adequately positioned at the level of the mid SVC. No pneumothorax appreciated. There is a dense opacity at the left lung base, atelectasis versus pneumonia. Probable small left pleural effusion as well. Right lung remains clear. Elevation the right hemidiaphragm is stable in the short-term interval. Osseous structures about the chest are unremarkable. IMPRESSION: 1. Right IJ central line adequately positioned with tip at the level of the mid SVC. No pneumothorax seen. 2. Endotracheal tube well positioned with tip just above the level of the carina.  3. Dense opacity at the left lung base, atelectasis versus pneumonia, probable small left pleural effusion. Electronically Signed   By: Franki Cabot M.D.   On: 01/15/2017 18:26   Dg Chest Port 1 View  Result Date: 01/15/2017 CLINICAL DATA:  Shortness of breath . EXAM: PORTABLE CHEST 1 VIEW COMPARISON:  No recent prior . FINDINGS: Mediastinum hilar structures are normal. Cardiomegaly with normal pulmonary vascularity. Low lung volumes. Mild bibasilar infiltrates. Small left pleural effusion cannot be excluded . IMPRESSION: Low lung volumes. Mild bibasilar infiltrates. Small left pleural effusion cannot be excluded.  Electronically Signed   By: Marcello Moores  Register   On: 01/15/2017 13:12   Dg Abd Portable 1v  Result Date: 01/15/2017 CLINICAL DATA:  Feeding tube placement. EXAM: PORTABLE ABDOMEN - 1 VIEW COMPARISON:  None. FINDINGS: Feeding tube appears appropriately positioned within the expected region of the stomach body. Visualized bowel gas pattern is nonobstructive. There is a patulous gas-filled loop of bowel within the left abdomen which likely has thickened walls. No evidence of soft tissue mass or abnormal fluid collection. No evidence free intraperitoneal air seen. Mild degenerative change noted within the scoliotic lumbar spine. IMPRESSION: 1. Feeding tube appears appropriately positioned in the left abdomen, expected region of the stomach body. 2. Probable bowel wall thickening in the left abdomen, uncertain if large or small bowel, suspicious for enteritis or colitis. Electronically Signed   By: Franki Cabot M.D.   On: 01/15/2017 20:52   HEMODYNAMICS: CVP:  [3 mmHg-7 mmHg] 7 mmHg  VENTILATOR SETTINGS: Vent Mode: PRVC FiO2 (%):  [40 %-100 %] 60 % Set Rate:  [14 bmp-20 bmp] 20 bmp Vt Set:  [400 mL-450 mL] 450 mL PEEP:  [5 cmH20] 5 cmH20 Plateau Pressure:  [21 cmH20-25 cmH20] 22 cmH20  INTAKE / OUTPUT: I/O last 3 completed shifts: In: 1968.5 [I.V.:877.2; NG/GT:291.3; IV Piggyback:800] Out: 1980 [id number]  PHYSICAL EXAMINATION: General:  Acutely ill female Neuro:  Awake and interactive, moving all ext to command HEENT:  Mm moist, no JVD  Cardiovascular:  RRR, Nl S1/S2, -M/R/G. Lungs:  CTA bilaterally Abdomen:  Round, soft, non tender  Musculoskeletal:  Warm and dry, no edema   LABS:  BMET  Recent Labs Lab 01/15/17 0404 01/15/17 1030 01/15/17 1719 01/15/17 2040 01/16/17 0030  NA 122* 120* 120* 123* 120*  K 3.6 3.7 3.8  --   --   CL 90* 90* 92*  --   --   CO2 18* 18* 20*  --   --   BUN 11 12 11   --   --   CREATININE 0.72 0.75 0.58  --   --   GLUCOSE 114* 123* 125*  --   --      Electrolytes  Recent Labs Lab 01/15/17 0404 01/15/17 1030 01/15/17 1719  CALCIUM 9.2 9.1 8.4*  MG  --   --  1.7  PHOS  --   --  3.0   CBC  Recent Labs Lab 01/12/17 1204 01/13/17 0328 01/15/17 0404  WBC 11.5* 11.9* 22.3*  HGB 14.2 14.2 14.1  HCT 42.9 42.2 41.6  PLT 289 355 355   Coag's No results for input(s): APTT, INR in the last 168 hours.  Sepsis Markers  Recent Labs Lab 01/12/17 1225  LATICACIDVEN 2.22*   ABG  Recent Labs Lab 01/15/17 1840 01/16/17 0456 01/16/17 0838  PHART 7.363 7.201* 7.308*  PCO2ART 36.7 54.5* 36.7  PO2ART 168* 90.0 68.0*   Liver Enzymes  Recent Labs Lab 01/12/17 1204  AST  28  ALT 18  ALKPHOS 46  BILITOT 0.9  ALBUMIN 4.0   Cardiac Enzymes No results for input(s): TROPONINI, PROBNP in the last 168 hours.  Glucose  Recent Labs Lab 01/15/17 1653 01/15/17 2046 01/16/17 0026 01/16/17 0424 01/16/17 0806  GLUCAP 127* 125* 97 92 129*   Imaging Dg Chest Port 1 View  Result Date: 01/15/2017 CLINICAL DATA:  Central line placement. EXAM: PORTABLE CHEST 1 VIEW COMPARISON:  Chest x-ray from earlier today. FINDINGS: Endotracheal tube has been placed with tip well positioned less than 2 cm above the carina. Right IJ central line in place with tip adequately positioned at the level of the mid SVC. No pneumothorax appreciated. There is a dense opacity at the left lung base, atelectasis versus pneumonia. Probable small left pleural effusion as well. Right lung remains clear. Elevation the right hemidiaphragm is stable in the short-term interval. Osseous structures about the chest are unremarkable. IMPRESSION: 1. Right IJ central line adequately positioned with tip at the level of the mid SVC. No pneumothorax seen. 2. Endotracheal tube well positioned with tip just above the level of the carina. 3. Dense opacity at the left lung base, atelectasis versus pneumonia, probable small left pleural effusion. Electronically Signed   By: Franki Cabot M.D.   On: 01/15/2017 18:26   Dg Chest Port 1 View  Result Date: 01/15/2017 CLINICAL DATA:  Shortness of breath . EXAM: PORTABLE CHEST 1 VIEW COMPARISON:  No recent prior . FINDINGS: Mediastinum hilar structures are normal. Cardiomegaly with normal pulmonary vascularity. Low lung volumes. Mild bibasilar infiltrates. Small left pleural effusion cannot be excluded . IMPRESSION: Low lung volumes. Mild bibasilar infiltrates. Small left pleural effusion cannot be excluded. Electronically Signed   By: Marcello Moores  Register   On: 01/15/2017 13:12   Dg Abd Portable 1v  Result Date: 01/15/2017 CLINICAL DATA:  Feeding tube placement. EXAM: PORTABLE ABDOMEN - 1 VIEW COMPARISON:  None. FINDINGS: Feeding tube appears appropriately positioned within the expected region of the stomach body. Visualized bowel gas pattern is nonobstructive. There is a patulous gas-filled loop of bowel within the left abdomen which likely has thickened walls. No evidence of soft tissue mass or abnormal fluid collection. No evidence free intraperitoneal air seen. Mild degenerative change noted within the scoliotic lumbar spine. IMPRESSION: 1. Feeding tube appears appropriately positioned in the left abdomen, expected region of the stomach body. 2. Probable bowel wall thickening in the left abdomen, uncertain if large or small bowel, suspicious for enteritis or colitis. Electronically Signed   By: Franki Cabot M.D.   On: 01/15/2017 20:52   SIGNIFICANT EVENTS  IVIG 1/30, 1/31, 2/1  STUDIES:  MR brain 1/29>>>neg LP 1/30>>>  CULTURES: CSF 1/30>>> BC x 2 2/1>>>   ANTIBIOTICS: Unasyn 2/1>>>  LINES/TUBES: ETT 2/1>>>  DISCUSSION:   ASSESSMENT / PLAN:  PULMONARY Acute respiratory failure - in setting neuromuscular weakness presumed r/t guillane barre.   ?aspiration  P:   Full vent support F/u CXR  F/u ABG  Continue IVIG per neuro   NEUROLOGIC Guillain barre syndrome  Sedation needs on vent  P:   RASS goal:  -1 Propofol, fentanyl gtt  PRN versed Neuro following  IVIg per neuro  CARDIOVASCULAR Hx HTN  P:  Hold home losartan  Levophed ordered incase propofol drops BP  RENAL Significant hyponatremia - new onset.  Low serum osm, high urine osm.  Likely r/t IVIG due to hyperproteinemia.    Continues to drop Na even with 3% P:  F/u chem  KVO IVF Repeat u/a and urine studies now  Increase 3% to 75 ml/hr Lasix 20 mg IV q8 x2 doses Nephrology consult called  GASTROINTESTINAL Dysphagia  P:   NPO  PPI  TF  Will need speech eval prior to PO's once extubated   HEMATOLOGIC Leukocytosis  P:  Monitor fever curve  Check pct  SQ Heparin  F/u cbc   INFECTIOUS ? Aspiration  Leukocytosis  P:   Empiric unasyn for now    ENDOCRINE No active issue  P:   Monitor glucose on chem   FAMILY  - Updates:  Husband updated at length at bedside 2/2  - Inter-disciplinary family meet or Palliative Care meeting due by: 2/8  The patient is critically ill with multiple organ systems failure and requires high complexity decision making for assessment and support, frequent evaluation and titration of therapies, application of advanced monitoring technologies and extensive interpretation of multiple databases.   Critical Care Time devoted to patient care services described in this note is  40  Minutes. This time reflects time of care of this signee Dr Jennet Maduro. This critical care time does not reflect procedure time, or teaching time or supervisory time of PA/NP/Med student/Med Resident etc but could involve care discussion time.  Rush Farmer, M.D. [facility] Pulmonary/Critical Care Medicine. Pager: [phone]. After hours pager: [phone].

## 2017-01-16 NOTE — Progress Notes (Signed)
RT note:  NIF/VC not obtained due to sedation.

## 2017-01-16 NOTE — Progress Notes (Signed)
Subjective: Intubated yesterday afternoon  Exam: Vitals:   01/16/17 1415 01/16/17 1500  BP: (!) 154/85 118/69  Pulse: 100 92  Resp: 20 20  Temp:     Gen: In bed, NAD Resp: non-labored breathing, no acute distress Abd: soft, nt  Neuro: MS: Awake, alert, interactive and appropriate CN: Extra ocular movements intact. FAcial weakness present on the left.  Motor: She has diffuse 2 -3/5 stregth in the upper extremitiens, 2/5 proximally in the legs.  She has only 1/5 plantarflexion today.  Sensory: Decreased to vibration ot the knees bilaterally.  DTR: She has absent reflexes diffusely   Pertinent Labs: Na 131  Impression: 66 year old female with progressive weakness and numbness and decreased reflexes in the setting of albuminocytologic dissociation, e.g. Guillian Barre syndrome. She has not yet reached her nadir. I suspect that she will likely require additional IVIG beyond the typical 5 days of 0.4mg /kg  Her hyponatremia is more likley to be directly related to GBS rather than as a complication of IVIG. SIADH can be a complicating factor with GBS.   Recommendations: 1)  IVIG,  day 4 2) Appreciate CCM consult  Roland Rack, MD [facility] [phone]  If 7pm- 7am, please page neurology on call as listed in [place].

## 2017-01-16 NOTE — Consult Note (Signed)
Reason for Consult: Hyponatremia  Referring Physician: Yarelis Morrison is an 66 y.o. female.  HPI: Patient is sedated on ventilator support. History obtained from chart review.     Joanne Morrison is 66 y.o. female with PMH of chronic cystitis and depression/anxiety who presented on 1/29 with L>R BLE weaknes, LUE weakness, and worsening myalgia. She was recently treated with a course of Azithromycin for strep pharyngitis. Neurology evaluated patient and felt her symptoms were most consistent with Joanne Morrison. IVIG was subsequently started on 1/30.   Weakness continued to worsen and patient had difficulty with secretions with a worsening NIF. CCM was consulted due to deteriorating respiratory status. Patient was intubated on 2/1.   On 2/1, patient also became profoundly hyponatremic. Na was 122 (previously 137). Patient was started on NS. Na continued to remain low so patient was started on 3% saline at 50 cc/hr. Rate was increased to 75 cc/hr due to lack of improvement. Most recent Na at time Nephrology asked to consult was 131.    Past Medical History:  Diagnosis Date  . Anxiety   . Arthritis    back  . Chronic cystitis   . Chronic low back pain   . Depression   . Diverticulosis of colon   . Hemorrhoids   . History of colon polyps    09-24-2009  rectal hyperplastic polyp/   and 2016 non-cancerous  . History of endometriosis   . History of panic attacks   . Hyperlipidemia   . Osteopenia   . Pelvic pain   . Sensation of pressure in bladder area   . Wears glasses     Past Surgical History:  Procedure Laterality Date  . BUNIONECTOMY Left 2004 approx  . CARPAL TUNNEL RELEASE Bilateral right 2014;  left 2007  . COLONOSCOPY W/ POLYPECTOMY  09/24/2009  . CYSTO WITH HYDRODISTENSION [name] 10/15/2016   Procedure: CYSTOSCOPY/HYDRODISTENSION AND MARCAINE INSTILLATION;  Surgeon: Rana Snare, MD;  Location: [facility];  Service: Urology;  Laterality: [name];  . CYSTO/   HYDRODISTENTION/  INSTILLATION THERAPY  1997 approx  . HYSTEROSCOPY W/D&C  11/01/2004   POLYPECTOMY  . LAPAROSCOPIC VAGINAL HYSTERECTOMY WITH SALPINGO OOPHORECTOMY Bilateral 04/20/2012  . TUBAL LIGATION  yrs ago    Family History  Problem Relation Age of Onset  . Hypertension Mother   . Dementia Mother   . Lung cancer Mother     smoker  . Osteoporosis Mother   . Heart attack Father 48  . Hypertension Brother   . Diabetes Maternal Aunt   . Cancer Maternal Uncle     RENAL  . Heart attack Maternal Grandmother 86  . Lung cancer Maternal Grandfather     Ecologist  . Pulmonary embolism Brother     ? etiology  . Anesthesia problems Neg Hx   . Stroke Neg Hx     Social History:  reports that she has never smoked. She has never used smokeless tobacco. She reports that she does not drink alcohol or use drugs.  Allergies:  Allergies  Allergen Reactions  . Olanzapine Other (See Comments)    STIFF JOINTS, EXTREMITY WEAKNESS, MEMORY LOSS, LOSS OF BALANCE  . Sumatriptan Other (See Comments)    REACTION: altered mental status  . Graciella Freer D-S] Other (See Comments)    lethargic    Results for orders placed or performed during the hospital encounter of 01/12/17 (from the past 48 hour(s))  Basic metabolic panel     Status: Abnormal  Collection Time: 01/15/17  4:04 AM  Result Value Ref Range   Sodium 122 (L) 135 - 145 mmol/L   Potassium 3.6 3.5 - 5.1 mmol/L   Chloride 90 (L) 101 - 111 mmol/L   CO2 18 (L) 22 - 32 mmol/L   Glucose, Bld 114 (H) 65 - 99 mg/dL   BUN 11 6 - 20 mg/dL   Creatinine, Ser 0.72 0.44 - 1.00 mg/dL   Calcium 9.2 8.9 - 10.3 mg/dL   GFR calc non Af Amer >60 >60 mL/min   GFR calc Af Amer >60 >60 mL/min    Comment: (NOTE) The eGFR has been calculated using the CKD EPI equation. This calculation has not been validated in all clinical situations. eGFR's persistently <60 mL/min signify possible Chronic Kidney Disease.    Anion gap 14 5 - 15  CBC      Status: Abnormal   Collection Time: 01/15/17  4:04 AM  Result Value Ref Range   WBC 22.3 (H) 4.0 - 10.5 K/uL   RBC 5.31 (H) 3.87 - 5.11 MIL/uL   Hemoglobin 14.1 12.0 - 15.0 g/dL   HCT 41.6 36.0 - 46.0 %   MCV 78.3 78.0 - 100.0 fL   MCH 26.6 26.0 - 34.0 pg   MCHC 33.9 30.0 - 36.0 g/dL   RDW 13.8 11.5 - 15.5 %   Platelets 355 150 - 400 K/uL  Osmolality     Status: Abnormal   Collection Time: 01/15/17  6:08 AM  Result Value Ref Range   Osmolality 261 (L) 275 - 295 mOsm/kg    Comment: CRITICAL RESULT CALLED TO, READ BACK BY AND VERIFIED WITH: Otho Darner [id number]  THANEY   Basic metabolic panel     Status: Abnormal   Collection Time: 01/15/17 10:30 AM  Result Value Ref Range   Sodium 120 (L) 135 - 145 mmol/L   Potassium 3.7 3.5 - 5.1 mmol/L   Chloride 90 (L) 101 - 111 mmol/L   CO2 18 (L) 22 - 32 mmol/L   Glucose, Bld 123 (H) 65 - 99 mg/dL   BUN 12 6 - 20 mg/dL   Creatinine, Ser 0.75 0.44 - 1.00 mg/dL   Calcium 9.1 8.9 - 10.3 mg/dL   GFR calc non Af Amer >60 >60 mL/min   GFR calc Af Amer >60 >60 mL/min    Comment: (NOTE) The eGFR has been calculated using the CKD EPI equation. This calculation has not been validated in all clinical situations. eGFR's persistently <60 mL/min signify possible Chronic Kidney Disease.    Anion gap 12 5 - 15  Osmolality, urine     Status: None   Collection Time: 01/15/17  1:08 PM  Result Value Ref Range   Osmolality, Ur 819 300 - 900 mOsm/kg  Sodium, urine, random     Status: None   Collection Time: 01/15/17  1:59 PM  Result Value Ref Range   Sodium, Ur 69 mmol/L  Urinalysis, Routine w reflex microscopic     Status: Abnormal   Collection Time: 01/15/17  1:59 PM  Result Value Ref Range   Color, Urine YELLOW YELLOW   APPearance CLEAR CLEAR   Specific Gravity, Urine 1.021 1.005 - 1.030   pH 5.0 5.0 - 8.0   Glucose, UA >=500 (A) NEGATIVE mg/dL   Hgb urine dipstick NEGATIVE NEGATIVE   Bilirubin Urine NEGATIVE NEGATIVE   Ketones, ur 80  (A) NEGATIVE mg/dL   Protein, ur 100 (A) NEGATIVE mg/dL   Nitrite NEGATIVE NEGATIVE  Leukocytes, UA NEGATIVE NEGATIVE   RBC / HPF 0-5 0 - 5 RBC/hpf   WBC, UA 0-5 0 - 5 WBC/hpf   Bacteria, UA NONE SEEN NONE SEEN   Squamous Epithelial / LPF 0-5 (A) NONE SEEN   Mucous PRESENT    Hyaline Casts, UA PRESENT    Granular Casts, UA PRESENT   Glucose, capillary     Status: Abnormal   Collection Time: 01/15/17  4:53 PM  Result Value Ref Range   Glucose-Capillary 127 (H) 65 - 99 mg/dL  Basic metabolic panel     Status: Abnormal   Collection Time: 01/15/17  5:19 PM  Result Value Ref Range   Sodium 120 (L) 135 - 145 mmol/L   Potassium 3.8 3.5 - 5.1 mmol/L   Chloride 92 (L) 101 - 111 mmol/L   CO2 20 (L) 22 - 32 mmol/L   Glucose, Bld 125 (H) 65 - 99 mg/dL   BUN 11 6 - 20 mg/dL   Creatinine, Ser 0.58 0.44 - 1.00 mg/dL   Calcium 8.4 (L) 8.9 - 10.3 mg/dL   GFR calc non Af Amer >60 >60 mL/min   GFR calc Af Amer >60 >60 mL/min    Comment: (NOTE) The eGFR has been calculated using the CKD EPI equation. This calculation has not been validated in all clinical situations. eGFR's persistently <60 mL/min signify possible Chronic Kidney Disease.    Anion gap 8 5 - 15  Magnesium     Status: None   Collection Time: 01/15/17  5:19 PM  Result Value Ref Range   Magnesium 1.7 1.7 - 2.4 mg/dL  Phosphorus     Status: None   Collection Time: 01/15/17  5:19 PM  Result Value Ref Range   Phosphorus 3.0 2.5 - 4.6 mg/dL  Triglycerides     Status: None   Collection Time: 01/15/17  5:20 PM  Result Value Ref Range   Triglycerides 70 <150 mg/dL  Draw ABG 1 hour after initiation of ventilator     Status: Abnormal   Collection Time: 01/15/17  6:40 PM  Result Value Ref Range   FIO2 100.00    Delivery systems VENTILATOR    Mode PRESSURE REGULATED VOLUME CONTROL    VT 400 mL   LHR 14.0 resp/min   Peep/cpap 5.0 cm H20   pH, Arterial 7.363 7.350 - 7.450   pCO2 arterial 36.7 32.0 - 48.0 mmHg   pO2, Arterial 168  (H) 83.0 - 108.0 mmHg   Bicarbonate 20.3 20.0 - 28.0 mmol/L   Acid-base deficit 4.1 (H) 0.0 - 2.0 mmol/L   O2 Saturation 99.0 %   Patient temperature 98.6    Collection site RIGHT RADIAL    Drawn by [phone]    Sample type ARTERIAL DRAW    Allens test (pass/fail) PASS PASS  Sodium     Status: Abnormal   Collection Time: 01/15/17  8:40 PM  Result Value Ref Range   Sodium 123 (L) 135 - 145 mmol/L  Glucose, capillary     Status: Abnormal   Collection Time: 01/15/17  8:46 PM  Result Value Ref Range   Glucose-Capillary 125 (H) 65 - 99 mg/dL  MRSA PCR Screening     Status: None   Collection Time: 01/15/17 10:32 PM  Result Value Ref Range   MRSA by PCR NEGATIVE NEGATIVE    Comment:        The GeneXpert MRSA Assay (FDA approved for NASAL specimens only), is one component of a comprehensive MRSA colonization surveillance  program. It is not intended to diagnose MRSA infection nor to guide or monitor treatment for MRSA infections.   Osmolality     Status: Abnormal   Collection Time: 01/15/17 10:45 PM  Result Value Ref Range   Osmolality 267 (L) 275 - 295 mOsm/kg  Glucose, capillary     Status: None   Collection Time: 01/16/17 12:26 AM  Result Value Ref Range   Glucose-Capillary 97 65 - 99 mg/dL  Sodium     Status: Abnormal   Collection Time: 01/16/17 12:30 AM  Result Value Ref Range   Sodium 120 (L) 135 - 145 mmol/L  Glucose, capillary     Status: None   Collection Time: 01/16/17  4:24 AM  Result Value Ref Range   Glucose-Capillary 92 65 - 99 mg/dL  I-STAT 3, arterial blood gas (G3+)     Status: Abnormal   Collection Time: 01/16/17  4:56 AM  Result Value Ref Range   pH, Arterial 7.201 (L) 7.350 - 7.450   pCO2 arterial 54.5 (H) 32.0 - 48.0 mmHg   pO2, Arterial 90.0 83.0 - 108.0 mmHg   Bicarbonate 21.2 20.0 - 28.0 mmol/L   TCO2 23 0 - 100 mmol/L   O2 Saturation 94.0 %   Acid-base deficit 7.0 (H) 0.0 - 2.0 mmol/L   Patient temperature 99.5 F    Collection site RADIAL,  ALLEN'S TEST ACCEPTABLE    Drawn by Operator    Sample type ARTERIAL   Basic metabolic panel     Status: Abnormal   Collection Time: 01/16/17  7:55 AM  Result Value Ref Range   Sodium 131 (L) 135 - 145 mmol/L   Potassium 4.0 3.5 - 5.1 mmol/L   Chloride 101 101 - 111 mmol/L   CO2 21 (L) 22 - 32 mmol/L   Glucose, Bld 120 (H) 65 - 99 mg/dL   BUN 18 6 - 20 mg/dL   Creatinine, Ser 1.23 (H) 0.44 - 1.00 mg/dL   Calcium 8.1 (L) 8.9 - 10.3 mg/dL   GFR calc non Af Amer 45 (L) >60 mL/min   GFR calc Af Amer 53 (L) >60 mL/min    Comment: (NOTE) The eGFR has been calculated using the CKD EPI equation. This calculation has not been validated in all clinical situations. eGFR's persistently <60 mL/min signify possible Chronic Kidney Disease.    Anion gap 9 5 - 15  Magnesium     Status: None   Collection Time: 01/16/17  7:55 AM  Result Value Ref Range   Magnesium 1.7 1.7 - 2.4 mg/dL  Phosphorus     Status: None   Collection Time: 01/16/17  7:55 AM  Result Value Ref Range   Phosphorus 2.5 2.5 - 4.6 mg/dL  Glucose, capillary     Status: Abnormal   Collection Time: 01/16/17  8:06 AM  Result Value Ref Range   Glucose-Capillary 129 (H) 65 - 99 mg/dL  I-STAT 3, arterial blood gas (G3+)     Status: Abnormal   Collection Time: 01/16/17  8:38 AM  Result Value Ref Range   pH, Arterial 7.308 (L) 7.350 - 7.450   pCO2 arterial 36.7 32.0 - 48.0 mmHg   pO2, Arterial 68.0 (L) 83.0 - 108.0 mmHg   Bicarbonate 18.4 (L) 20.0 - 28.0 mmol/L   TCO2 20 0 - 100 mmol/L   O2 Saturation 92.0 %   Acid-base deficit 7.0 (H) 0.0 - 2.0 mmol/L   Patient temperature 98.6 F    Collection site RADIAL, ALLEN'S TEST ACCEPTABLE  Sample type ARTERIAL   Glucose, capillary     Status: Abnormal   Collection Time: 01/16/17 11:20 AM  Result Value Ref Range   Glucose-Capillary 138 (H) 65 - 99 mg/dL    Dg Chest Port 1 View  Result Date: 01/15/2017 CLINICAL DATA:  Central line placement. EXAM: PORTABLE CHEST 1 VIEW  COMPARISON:  Chest x-ray from earlier today. FINDINGS: Endotracheal tube has been placed with tip well positioned less than 2 cm above the carina. Right IJ central line in place with tip adequately positioned at the level of the mid SVC. No pneumothorax appreciated. There is a dense opacity at the left lung base, atelectasis versus pneumonia. Probable small left pleural effusion as well. Right lung remains clear. Elevation the right hemidiaphragm is stable in the short-term interval. Osseous structures about the chest are unremarkable. IMPRESSION: 1. Right IJ central line adequately positioned with tip at the level of the mid SVC. No pneumothorax seen. 2. Endotracheal tube well positioned with tip just above the level of the carina. 3. Dense opacity at the left lung base, atelectasis versus pneumonia, probable small left pleural effusion. Electronically Signed   By: Franki Cabot M.D.   On: 01/15/2017 18:26   Dg Chest Port 1 View  Result Date: 01/15/2017 CLINICAL DATA:  Shortness of breath . EXAM: PORTABLE CHEST 1 VIEW COMPARISON:  No recent prior . FINDINGS: Mediastinum hilar structures are normal. Cardiomegaly with normal pulmonary vascularity. Low lung volumes. Mild bibasilar infiltrates. Small left pleural effusion cannot be excluded . IMPRESSION: Low lung volumes. Mild bibasilar infiltrates. Small left pleural effusion cannot be excluded. Electronically Signed   By: Marcello Moores  Register   On: 01/15/2017 13:12   Dg Abd Portable 1v  Result Date: 01/15/2017 CLINICAL DATA:  Feeding tube placement. EXAM: PORTABLE ABDOMEN - 1 VIEW COMPARISON:  None. FINDINGS: Feeding tube appears appropriately positioned within the expected region of the stomach body. Visualized bowel gas pattern is nonobstructive. There is a patulous gas-filled loop of bowel within the left abdomen which likely has thickened walls. No evidence of soft tissue mass or abnormal fluid collection. No evidence free intraperitoneal air seen. Mild  degenerative change noted within the scoliotic lumbar spine. IMPRESSION: 1. Feeding tube appears appropriately positioned in the left abdomen, expected region of the stomach body. 2. Probable bowel wall thickening in the left abdomen, uncertain if large or small bowel, suspicious for enteritis or colitis. Electronically Signed   By: Franki Cabot M.D.   On: 01/15/2017 20:52    ROS: Unable to obtain due to sedation.  Blood pressure 96/63, pulse (!) 106, temperature 97.9 F (36.6 C), resp. rate (!) 21, height 5' 2" (1.575 m), weight 157 lb 13.6 oz (71.6 kg), SpO2 93 %. Physical Exam  Constitutional:  Sedated on ventilator support. Not responsive to voice or touch.   Cardiovascular: Normal rate and regular rhythm.   No murmur heard. Respiratory: Breath sounds normal. She has no wheezes.  GI: Soft. Bowel sounds are normal. She exhibits no distension. There is no tenderness.  Musculoskeletal: She exhibits no edema.  Skin: Skin is warm and dry.    Assessment/Plan: 1 Hyponatremia:  With hypo-osmolality of 267, urine osmolality of 819, and urine Na of 69. Na has improved to 131 (nadir of 120) with correction using 3% saline however concern that may have been too rapid of correction given potential for damaging side effects from rapid osmotic shifts. The urine osmolality is elevated.  The appropriate renal response to hypo-osmolality would be to  excrete a maximally dilute urine. The concentrated urine result in this patient is suggestive of impaired free water excretion due to ADH. However, unclear whether this is an appropriate or inappropriate response of ADH. Patient's blood pressure is low and therefore, would expect appropriate compensatory mechanism of rise in ADH to increase volume status in body's attempt to regulate BP. However, urine Na level >40 is concerning for SIADH. SIADH has been associated with GBS thought possibly due to autonomic involvement. There may also be a component of  pseudohyponatremia given administration of IVIG.  -will repeat urine electrolytes and urine osmolality  -monitoring electrolytes on BMET q4h  -have discontinued 3% NS given concern for too rapid of correction of hyponatremia  -patient needs volume support so will initiate NS at 150 cc/hr  -patient has received loop diuretics which would theoretically help to increase Na serum concentration  Component of appropriate ADH and UOSM most c/w that.  Reasons for SIADH with psychoactive high dose meds, pain meds, GB 2. Hypotension: Of note, patient did receive significant dose of Valium and narcotics on 2/1. Needs volume support so will give NS at 150 cc/hr. Not currently requiring pressors.  3. Guillain Barre Syndrome: Continue IVIG per neurology 4. ?Aspiration PNA: With leukocytosis and opacity on CXR. On empiric Unasyn per CCM.  5. VDRF: In setting of neuromuscular failure related to GBS.    Melina Schools 01/16/2017, 12:19 PM I have seen and examined this patient and agree with the plan of care seen, eval, examined, discussed with staff, residents. And family.  Avoid too rapid rise SNa .  DETERDING,JAMES L 01/16/2017, 4:09 PM

## 2017-01-16 NOTE — Progress Notes (Signed)
SLP Cancellation Note  Patient Details Name: Crysal Toalson MRN: [id number] DOB: [birth year]   Cancelled treatment:       Reason Eval/Treat Not Completed: Medical issues which prohibited therapy. Pt intubated, will sign off   Krishon Adkison, Katherene Ponto 01/16/2017, 11:47 AM

## 2017-01-16 NOTE — Progress Notes (Signed)
eLink Physician-Brief Progress Note Patient Name: Joanne Morrison DOB: 12-15-1951 MRN: [id number]   Date of Service  01/16/2017  HPI/Events of Note  Hypotension with BP mean in the 50s with CVP of 3 and drop in sats prompting check of ABG.  PH 7.2/53/87/21  eICU Interventions  Plan: Fluid bolus for hypotension Increase in RR and TV on vent Recheck ABG in 2 hours     Intervention Category Major Interventions: Acid-Base disturbance - evaluation and management;Hypotension - evaluation and management;Hypovolemia - evaluation and treatment with fluids  DETERDING,ELIZABETH 01/16/2017, 5:02 AM

## 2017-01-16 NOTE — Progress Notes (Signed)
Noted pt is intubated. I will follow at a distance. [id number]

## 2017-01-16 NOTE — Progress Notes (Signed)
OT Cancellation Note  Patient Details Name: Joanne Morrison MRN: [id number] DOB: Jan 10, 1951   Cancelled Treatment:    Reason Eval/Treat Not Completed: Medical issues which prohibited therapy.  Pt is currently intubated and sedated.  Will check back next week.  [place], OTR/L [id number]   Lucille Passy M 01/16/2017, 12:47 PM

## 2017-01-17 ENCOUNTER — Inpatient Hospital Stay (HOSPITAL_COMMUNITY): Payer: Medicare Other

## 2017-01-17 DIAGNOSIS — E871 Hypo-osmolality and hyponatremia: Secondary | ICD-10-CM | POA: Diagnosis not present

## 2017-01-17 DIAGNOSIS — G610 Guillain-Barre syndrome: Secondary | ICD-10-CM | POA: Diagnosis not present

## 2017-01-17 DIAGNOSIS — J9601 Acute respiratory failure with hypoxia: Secondary | ICD-10-CM

## 2017-01-17 DIAGNOSIS — I959 Hypotension, unspecified: Secondary | ICD-10-CM | POA: Diagnosis not present

## 2017-01-17 DIAGNOSIS — J189 Pneumonia, unspecified organism: Secondary | ICD-10-CM

## 2017-01-17 LAB — BLOOD GAS, ARTERIAL
ACID-BASE DEFICIT: 1.5 mmol/L (ref 0.0–2.0)
BICARBONATE: 22.6 mmol/L (ref 20.0–28.0)
DRAWN BY: 449561
FIO2: 50
LHR: 20 {breaths}/min
O2 Saturation: 96 %
PEEP: 5 cmH2O
Patient temperature: 99.7
VT: 450 mL
pCO2 arterial: 38.4 mmHg (ref 32.0–48.0)
pH, Arterial: 7.391 (ref 7.350–7.450)
pO2, Arterial: 80.9 mmHg — ABNORMAL LOW (ref 83.0–108.0)

## 2017-01-17 LAB — CULTURE, BLOOD (ROUTINE X 2)
CULTURE: NO GROWTH
Culture: NO GROWTH

## 2017-01-17 LAB — PHOSPHORUS: PHOSPHORUS: 1.7 mg/dL — AB (ref 2.5–4.6)

## 2017-01-17 LAB — GLUCOSE, CAPILLARY
GLUCOSE-CAPILLARY: 110 mg/dL — AB (ref 65–99)
GLUCOSE-CAPILLARY: 124 mg/dL — AB (ref 65–99)
GLUCOSE-CAPILLARY: 128 mg/dL — AB (ref 65–99)
GLUCOSE-CAPILLARY: 163 mg/dL — AB (ref 65–99)
Glucose-Capillary: 140 mg/dL — ABNORMAL HIGH (ref 65–99)
Glucose-Capillary: 137 mg/dL — ABNORMAL HIGH (ref 65–99)
Glucose-Capillary: 101 mg/dL — ABNORMAL HIGH (ref 65–99)

## 2017-01-17 LAB — CBC
HCT: 30.2 % — ABNORMAL LOW (ref 36.0–46.0)
Hemoglobin: 10.2 g/dL — ABNORMAL LOW (ref 12.0–15.0)
MCH: 26.6 pg (ref 26.0–34.0)
MCHC: 33.8 g/dL (ref 30.0–36.0)
MCV: 78.9 fL (ref 78.0–100.0)
PLATELETS: 229 10*3/uL (ref 150–400)
RBC: 3.83 MIL/uL — AB (ref 3.87–5.11)
RDW: 14.5 % (ref 11.5–15.5)
WBC: 20.6 10*3/uL — AB (ref 4.0–10.5)

## 2017-01-17 LAB — MAGNESIUM: Magnesium: 1.8 mg/dL (ref 1.7–2.4)

## 2017-01-17 LAB — BASIC METABOLIC PANEL
ANION GAP: 4 — AB (ref 5–15)
BUN: 15 mg/dL (ref 6–20)
CO2: 23 mmol/L (ref 22–32)
Calcium: 8 mg/dL — ABNORMAL LOW (ref 8.9–10.3)
Chloride: 104 mmol/L (ref 101–111)
Creatinine, Ser: 0.75 mg/dL (ref 0.44–1.00)
Glucose, Bld: 135 mg/dL — ABNORMAL HIGH (ref 65–99)
POTASSIUM: 3.6 mmol/L (ref 3.5–5.1)
Sodium: 131 mmol/L — ABNORMAL LOW (ref 135–145)

## 2017-01-17 LAB — SODIUM
SODIUM: 131 mmol/L — AB (ref 135–145)
SODIUM: 131 mmol/L — AB (ref 135–145)

## 2017-01-17 MED ORDER — VANCOMYCIN HCL IN DEXTROSE 1-5 GM/200ML-% IV SOLN
1000.0000 mg | Freq: Two times a day (BID) | INTRAVENOUS | Status: AC
  Administered 2017-01-17 – 2017-01-24 (×16): 1000 mg via INTRAVENOUS
  Filled 2017-01-17 (×17): qty 200

## 2017-01-17 MED ORDER — MAGNESIUM SULFATE 2 GM/50ML IV SOLN
2.0000 g | Freq: Once | INTRAVENOUS | Status: AC
  Administered 2017-01-17: 2 g via INTRAVENOUS
  Filled 2017-01-17: qty 50

## 2017-01-17 MED ORDER — PANTOPRAZOLE SODIUM 40 MG PO PACK
40.0000 mg | PACK | Freq: Every day | ORAL | Status: DC
  Administered 2017-01-17 – 2017-02-04 (×18): 40 mg
  Filled 2017-01-17 (×18): qty 20

## 2017-01-17 MED ORDER — PIPERACILLIN-TAZOBACTAM 3.375 G IVPB
3.3750 g | Freq: Three times a day (TID) | INTRAVENOUS | Status: AC
  Administered 2017-01-17 – 2017-01-24 (×22): 3.375 g via INTRAVENOUS
  Filled 2017-01-17 (×23): qty 50

## 2017-01-17 MED ORDER — POTASSIUM PHOSPHATES 15 MMOLE/5ML IV SOLN
30.0000 mmol | Freq: Once | INTRAVENOUS | Status: AC
  Administered 2017-01-17: 30 mmol via INTRAVENOUS
  Filled 2017-01-17: qty 10

## 2017-01-17 NOTE — Progress Notes (Signed)
Subjective: Intubated yesterday afternoon  Exam: Vitals:   01/17/17 0725 01/17/17 0758  BP:    Pulse: 83   Resp: 20   Temp:  97.9 F (36.6 C)   Gen: In bed, NAD Resp: non-labored breathing, no acute distress Abd: soft, nt  Neuro: MS: Awake, alert, interactive and appropriate CN: Extra ocular movements intact. Facial weakness present on the left.  Motor:      Right  Left Elbow Flexion   1/5  2/5 Elbow Extension  1/5  2/5 Wrist Flexion   3/5  3/5 Wrist Extension  3/5  3/5 Grip      4-/5  4/5  Hip Flexion   0/5  0/5 Knee Flexion   1/5  0/5 Knee Extension  1/5  0/5 Ankle Dorsiflexion  0/5  0/5 Ankle Plantarflexion  2/5  2/5  Able to wiggle toes bilaterally    DTR: She has absent reflexes diffusely   Pertinent Labs: Na 131, stable  Impression: 66 year old female with progressive weakness and numbness and decreased reflexes in the setting of albuminocytologic dissociation, e.g. Guillian Barre syndrome. She has not yet reached her nadir. I suspect that she will likely require additional IVIG beyond the typical 5 days of 0.4mg /kg  There have never been dose finding studies of IVIG and multiple studies suggest that additional dosing can be beneficial. The decision to change to PLEX or not is not straight forward. I think that her level of worsening is lessening though not sure that she has truly hit her nadir. If we do purse PLEX, I would fear that we would remove the benefit of IVIG and there is still a chance that she could respond.  Recommendations: 1)  IVIG,  day 5, will plan for additional 1g/kg divided over two days.  2) Appreciate CCM consult 3) continue NIF/VC, qshift  Roland Rack, MD [facility] [phone]  If 7pm- 7am, please page neurology on call as listed in [place].

## 2017-01-17 NOTE — Progress Notes (Signed)
Pharmacy Antibiotic Note  Joanne Morrison is a 66 y.o. female admitted on 01/12/2017 with suspected Guillian Barre syndrome, now with opacity on CXR, elevated WBC.  Pharmacy has been consulted for vancomycin and zosyn dosing.  Plan: 1. Vancomycin 1g IV q 12 hrs. 2. Zosyn 3.375g IV q 8 hrs (4 hr extended interval infusion) 3. Monitor cultures, renal function and clinical course. 4. Vancomycin trough level at steady state as necessary.  Height: 5\' 2"  (157.5 cm) Weight: 156 lb 8.4 oz (71 kg) IBW/kg (Calculated) : 50.1  Temp (24hrs), Avg:98.7 F (37.1 C), Min:97.9 F (36.6 C), Max:99.7 F (37.6 C)   Recent Labs Lab 01/11/17 2226 01/12/17 1204 01/12/17 1225 01/13/17 0328 01/15/17 0404 01/15/17 1030 01/15/17 1719 01/16/17 0755 01/17/17 0433  WBC 10.0 11.5*  --  11.9* 22.3*  --   --   --  20.6*  CREATININE 1.65* 1.14*  --  0.95 0.72 0.75 0.58 1.23* 0.75  LATICACIDVEN  --   --  2.22*  --   --   --   --   --   --     Estimated Creatinine Clearance: 65.6 mL/min (by C-G formula based on SCr of 0.75 mg/dL).    Allergies  Allergen Reactions  . Olanzapine Other (See Comments)    STIFF JOINTS, EXTREMITY WEAKNESS, MEMORY LOSS, LOSS OF BALANCE  . Sumatriptan Other (See Comments)    REACTION: altered mental status  . Graciella Freer D-S] Other (See Comments)    lethargic    Antimicrobials this admission:  Unasyn 2/1 >> 2/3 Fluconazole 2/1 >>  Vanc >> Zosyn >>  Dose adjustments this admission:    Microbiology results:  2/1 BCx: ngtd 1/29 BCx: ngtd 1/30 CSF: neg ** UCx: ** ** Sputum: ** 2/1 MRSA PCR: *neg   Thank you for allowing pharmacy to be a part of this patient's care.  Uvaldo Rising, BCPS  Clinical Pharmacist Pager [phone]  01/17/2017 10:47 AM

## 2017-01-17 NOTE — Progress Notes (Signed)
Patient NIf and VC done, but patient seemed really sedated and unable to do very well. NIf was -12 and Vc about 500. But again patient seemed very tired at this time.

## 2017-01-17 NOTE — Progress Notes (Signed)
NIF -18 VC 650 ml  PT had good effort

## 2017-01-17 NOTE — Progress Notes (Signed)
Subjective: Interval History: has no complaint, on vent awake and coop.  Objective: Vital signs in last 24 hours: Temp:  [97.9 F (36.6 C)-99.7 F (37.6 C)] 97.9 F (36.6 C) (02/03 0758) Pulse Rate:  [82-123] 83 (02/03 0725) Resp:  [20-24] 20 (02/03 0725) BP: (70-184)/(46-110) 101/56 (02/03 0700) SpO2:  [92 %-98 %] 96 % (02/03 0725) FiO2 (%):  [50 %-60 %] 50 % (02/03 0725) Weight:  [71 kg (156 lb 8.4 oz)] 71 kg (156 lb 8.4 oz) (02/03 0500) Weight change: -0.6 kg (-1 lb 5.2 oz)  Intake/Output from previous day: 02/02 0701 - 02/03 0700 In: 5261.5 [I.V.:3351.5; NG/GT:1000; IV Piggyback:910] Out: 1990 [Urine:1990] Intake/Output this shift: No intake/output data recorded.  General appearance: cooperative, mildly obese and pale Resp: rales bibasilar Cardio: S1, S2 normal and systolic murmur: holosystolic 2/6, blowing at apex GI: obese, pos bs, soft Extremities: extremities normal, atraumatic, no cyanosis or edema  Lab Results:  Recent Labs  01/15/17 0404 01/17/17 0433  WBC 22.3* 20.6*  HGB 14.1 10.2*  HCT 41.6 30.2*  PLT 355 229   BMET:  Recent Labs  01/16/17 0755  01/17/17 0013 01/17/17 0433  NA 131*  < > 131* 131*  131*  K 4.0  --   --  3.6  CL 101  --   --  104  CO2 21*  --   --  23  GLUCOSE 120*  --   --  135*  BUN 18  --   --  15  CREATININE 1.23*  --   --  0.75  CALCIUM 8.1*  --   --  8.0*  < > = values in this interval not displayed. No results for input(s): PTH in the last 72 hours. Iron Studies: No results for input(s): IRON, TIBC, TRANSFERRIN, FERRITIN in the last 72 hours.  Studies/Results: Dg Chest Port 1 View  Result Date: 01/17/2017 CLINICAL DATA:  66 year old female admitted for lower extremity weakness, worsening myalgia and inability to walk. Recent strep pharyngitis. Suspected Guillain-Barre. Deterioration of respiratory stress status, subsequently intubated. Possible aspiration. Initial encounter. EXAM: PORTABLE CHEST 1 VIEW COMPARISON:   01/15/2017 and earlier. FINDINGS: Portable AP semi upright view at 0513 hours. Stable endotracheal tube tip at the level the clavicles. Enteric tube courses to the left abdomen, tip not included. Kyphotic positioning. Continued dense and confluent retrocardiac opacity. Increased patchy left perihilar opacity. Mildly increased patchy and veiling right lung opacity. No pneumothorax. No pulmonary edema. Stable cardiac size and mediastinal contours. IMPRESSION: 1.  Stable lines and tubes. 2. Continued left lower lobe collapse or consolidation. Interval worsening left perihilar and right lung base opacity which may represent a combination of airspace disease and small pleural effusion. Suspect bilateral pneumonia which could be the sequelae of aspiration in this clinical setting. Electronically Signed   By: Genevie Ann M.D.   On: 01/17/2017 07:17   Dg Chest Port 1 View  Result Date: 01/15/2017 CLINICAL DATA:  Central line placement. EXAM: PORTABLE CHEST 1 VIEW COMPARISON:  Chest x-ray from earlier today. FINDINGS: Endotracheal tube has been placed with tip well positioned less than 2 cm above the carina. Right IJ central line in place with tip adequately positioned at the level of the mid SVC. No pneumothorax appreciated. There is a dense opacity at the left lung base, atelectasis versus pneumonia. Probable small left pleural effusion as well. Right lung remains clear. Elevation the right hemidiaphragm is stable in the short-term interval. Osseous structures about the chest are unremarkable. IMPRESSION: 1.  Right IJ central line adequately positioned with tip at the level of the mid SVC. No pneumothorax seen. 2. Endotracheal tube well positioned with tip just above the level of the carina. 3. Dense opacity at the left lung base, atelectasis versus pneumonia, probable small left pleural effusion. Electronically Signed   By: Franki Cabot M.D.   On: 01/15/2017 18:26   Dg Chest Port 1 View  Result Date: 01/15/2017 CLINICAL  DATA:  Shortness of breath . EXAM: PORTABLE CHEST 1 VIEW COMPARISON:  No recent prior . FINDINGS: Mediastinum hilar structures are normal. Cardiomegaly with normal pulmonary vascularity. Low lung volumes. Mild bibasilar infiltrates. Small left pleural effusion cannot be excluded . IMPRESSION: Low lung volumes. Mild bibasilar infiltrates. Small left pleural effusion cannot be excluded. Electronically Signed   By: Marcello Moores  Register   On: 01/15/2017 13:12   Dg Abd Portable 1v  Result Date: 01/15/2017 CLINICAL DATA:  Feeding tube placement. EXAM: PORTABLE ABDOMEN - 1 VIEW COMPARISON:  None. FINDINGS: Feeding tube appears appropriately positioned within the expected region of the stomach body. Visualized bowel gas pattern is nonobstructive. There is a patulous gas-filled loop of bowel within the left abdomen which likely has thickened walls. No evidence of soft tissue mass or abnormal fluid collection. No evidence free intraperitoneal air seen. Mild degenerative change noted within the scoliotic lumbar spine. IMPRESSION: 1. Feeding tube appears appropriately positioned in the left abdomen, expected region of the stomach body. 2. Probable bowel wall thickening in the left abdomen, uncertain if large or small bowel, suspicious for enteritis or colitis. Electronically Signed   By: Franki Cabot M.D.   On: 01/15/2017 20:52    I have reviewed the patient's current medications.  Assessment/Plan: 1 hyponatremia stable, UOSM better indicating vol was primary role.  Need to cont to replete and keep bp up 2 GB per neuro 3 Pneu asp per CCM 4 VDRF  5 Chronic pain P cont NS, check UOSM in am.      LOS: 4 days   Cristen Bredeson L 01/17/2017,8:25 AM

## 2017-01-17 NOTE — Progress Notes (Signed)
PT was unable to do NIF and VC due to sedation

## 2017-01-18 ENCOUNTER — Inpatient Hospital Stay (HOSPITAL_COMMUNITY): Payer: Medicare Other

## 2017-01-18 DIAGNOSIS — B370 Candidal stomatitis: Secondary | ICD-10-CM | POA: Diagnosis not present

## 2017-01-18 DIAGNOSIS — G610 Guillain-Barre syndrome: Secondary | ICD-10-CM | POA: Diagnosis not present

## 2017-01-18 DIAGNOSIS — E871 Hypo-osmolality and hyponatremia: Secondary | ICD-10-CM | POA: Diagnosis not present

## 2017-01-18 DIAGNOSIS — I959 Hypotension, unspecified: Secondary | ICD-10-CM | POA: Diagnosis not present

## 2017-01-18 DIAGNOSIS — I4892 Unspecified atrial flutter: Secondary | ICD-10-CM | POA: Diagnosis not present

## 2017-01-18 DIAGNOSIS — E870 Hyperosmolality and hypernatremia: Secondary | ICD-10-CM | POA: Diagnosis not present

## 2017-01-18 DIAGNOSIS — I480 Paroxysmal atrial fibrillation: Secondary | ICD-10-CM | POA: Diagnosis not present

## 2017-01-18 DIAGNOSIS — J690 Pneumonitis due to inhalation of food and vomit: Secondary | ICD-10-CM | POA: Diagnosis not present

## 2017-01-18 DIAGNOSIS — J9601 Acute respiratory failure with hypoxia: Secondary | ICD-10-CM | POA: Diagnosis not present

## 2017-01-18 DIAGNOSIS — G8250 Quadriplegia, unspecified: Secondary | ICD-10-CM | POA: Diagnosis not present

## 2017-01-18 LAB — BASIC METABOLIC PANEL
Anion gap: 5 (ref 5–15)
BUN: 11 mg/dL (ref 6–20)
CO2: 27 mmol/L (ref 22–32)
Calcium: 8.3 mg/dL — ABNORMAL LOW (ref 8.9–10.3)
Chloride: 96 mmol/L — ABNORMAL LOW (ref 101–111)
Creatinine, Ser: 0.74 mg/dL (ref 0.44–1.00)
GFR calc Af Amer: >60 mL/min (ref >60)
GFR calc non Af Amer: >60 mL/min (ref >60)
Glucose, Bld: 153 mg/dL — ABNORMAL HIGH (ref 65–99)
Potassium: 4.3 mmol/L (ref 3.5–5.1)
Sodium: 128 mmol/L — ABNORMAL LOW (ref 135–145)

## 2017-01-18 LAB — RENAL FUNCTION PANEL
ALBUMIN: 1.6 g/dL — AB (ref 3.5–5.0)
Anion gap: 6 (ref 5–15)
BUN: 12 mg/dL (ref 6–20)
CALCIUM: 7.9 mg/dL — AB (ref 8.9–10.3)
CHLORIDE: 98 mmol/L — AB (ref 101–111)
CO2: 24 mmol/L (ref 22–32)
CREATININE: 0.7 mg/dL (ref 0.44–1.00)
Glucose, Bld: 196 mg/dL — ABNORMAL HIGH (ref 65–99)
Phosphorus: 2.5 mg/dL (ref 2.5–4.6)
Potassium: 3.2 mmol/L — ABNORMAL LOW (ref 3.5–5.1)
SODIUM: 128 mmol/L — AB (ref 135–145)

## 2017-01-18 LAB — GLUCOSE, CAPILLARY
GLUCOSE-CAPILLARY: 153 mg/dL — AB (ref 65–99)
GLUCOSE-CAPILLARY: 133 mg/dL — AB (ref 65–99)
GLUCOSE-CAPILLARY: 134 mg/dL — AB (ref 65–99)
GLUCOSE-CAPILLARY: 148 mg/dL — AB (ref 65–99)
Glucose-Capillary: 159 mg/dL — ABNORMAL HIGH (ref 65–99)
Glucose-Capillary: 112 mg/dL — ABNORMAL HIGH (ref 65–99)

## 2017-01-18 LAB — BLOOD GAS, ARTERIAL
Acid-base deficit: 0.6 mmol/L (ref 0.0–2.0)
Bicarbonate: 23.4 mmol/L (ref 20.0–28.0)
Drawn by: 24513
FIO2: 40
O2 Saturation: 98.1 %
PATIENT TEMPERATURE: 98.6
PCO2 ART: 37.8 mmHg (ref 32.0–48.0)
PEEP: 5 cmH2O
PO2 ART: 105 mmHg (ref 83.0–108.0)
RATE: 20 resp/min
VT: 450 mL
pH, Arterial: 7.409 (ref 7.350–7.450)

## 2017-01-18 LAB — OSMOLALITY, URINE: OSMOLALITY UR: 601 mosm/kg (ref 300–900)

## 2017-01-18 LAB — MAGNESIUM: Magnesium: 1.7 mg/dL (ref 1.7–2.4)

## 2017-01-18 LAB — TRIGLYCERIDES: Triglycerides: 111 mg/dL (ref <150)

## 2017-01-18 MED ORDER — INSULIN ASPART 100 UNIT/ML ~~LOC~~ SOLN
0.0000 [IU] | SUBCUTANEOUS | Status: DC
  Administered 2017-01-18: 3 [IU] via SUBCUTANEOUS
  Administered 2017-01-18 – 2017-01-20 (×10): 2 [IU] via SUBCUTANEOUS
  Administered 2017-01-20 – 2017-01-21 (×4): 3 [IU] via SUBCUTANEOUS
  Administered 2017-01-21 (×2): 2 [IU] via SUBCUTANEOUS
  Administered 2017-01-21: 3 [IU] via SUBCUTANEOUS
  Administered 2017-01-22: 2 [IU] via SUBCUTANEOUS
  Administered 2017-01-22 – 2017-01-23 (×4): 3 [IU] via SUBCUTANEOUS
  Administered 2017-01-23 (×2): 2 [IU] via SUBCUTANEOUS
  Administered 2017-01-24: 3 [IU] via SUBCUTANEOUS
  Administered 2017-01-24 (×4): 2 [IU] via SUBCUTANEOUS
  Administered 2017-01-24 – 2017-01-25 (×2): 3 [IU] via SUBCUTANEOUS
  Administered 2017-01-25: 2 [IU] via SUBCUTANEOUS
  Administered 2017-01-25: 3 [IU] via SUBCUTANEOUS
  Administered 2017-01-25 – 2017-01-28 (×10): 2 [IU] via SUBCUTANEOUS
  Administered 2017-01-28: 3 [IU] via SUBCUTANEOUS
  Administered 2017-01-28 – 2017-01-29 (×6): 2 [IU] via SUBCUTANEOUS
  Administered 2017-01-29: 3 [IU] via SUBCUTANEOUS
  Administered 2017-01-30 (×2): 2 [IU] via SUBCUTANEOUS
  Administered 2017-01-30: 3 [IU] via SUBCUTANEOUS
  Administered 2017-01-30 – 2017-01-31 (×8): 2 [IU] via SUBCUTANEOUS
  Administered 2017-01-31: 3 [IU] via SUBCUTANEOUS
  Administered 2017-02-01 – 2017-02-03 (×9): 2 [IU] via SUBCUTANEOUS
  Administered 2017-02-03 (×2): 3 [IU] via SUBCUTANEOUS
  Administered 2017-02-04: 2 [IU] via SUBCUTANEOUS
  Administered 2017-02-04 (×2): 3 [IU] via SUBCUTANEOUS

## 2017-01-18 MED ORDER — NAPHAZOLINE-GLYCERIN 0.012-0.2 % OP SOLN
1.0000 [drp] | Freq: Four times a day (QID) | OPHTHALMIC | Status: DC | PRN
  Administered 2017-01-20 – 2017-01-22 (×4): 2 [drp] via OPHTHALMIC
  Administered 2017-01-24 – 2017-01-25 (×4): 1 [drp] via OPHTHALMIC
  Administered 2017-01-25: 2 [drp] via OPHTHALMIC
  Administered 2017-01-25: 1 [drp] via OPHTHALMIC
  Administered 2017-01-26: 2 [drp] via OPHTHALMIC
  Filled 2017-01-18 (×2): qty 15

## 2017-01-18 MED ORDER — ARTIFICIAL TEARS OP OINT: TOPICAL_OINTMENT | Freq: Three times a day (TID) | OPHTHALMIC | Status: DC

## 2017-01-18 MED ORDER — POTASSIUM CHLORIDE 20 MEQ/15ML (10%) PO SOLN
30.0000 meq | ORAL | Status: AC
  Administered 2017-01-18 (×2): 30 meq
  Filled 2017-01-18 (×2): qty 30

## 2017-01-18 MED ORDER — IMMUNE GLOBULIN (HUMAN) 20 GM/200ML IV SOLN
500.0000 mg/kg | INTRAVENOUS | Status: AC
  Administered 2017-01-18 – 2017-01-19 (×2): 40 g via INTRAVENOUS
  Filled 2017-01-18 (×3): qty 400

## 2017-01-18 MED ORDER — HYDRALAZINE HCL 20 MG/ML IJ SOLN
10.0000 mg | INTRAMUSCULAR | Status: DC | PRN
  Administered 2017-01-19 – 2017-01-25 (×4): 10 mg via INTRAVENOUS
  Filled 2017-01-18 (×8): qty 1

## 2017-01-18 MED ORDER — FUROSEMIDE 10 MG/ML IJ SOLN
80.0000 mg | Freq: Once | INTRAMUSCULAR | Status: AC
  Administered 2017-01-18: 80 mg via INTRAVENOUS
  Filled 2017-01-18: qty 8

## 2017-01-18 MED ORDER — POTASSIUM CHLORIDE 20 MEQ/15ML (10%) PO SOLN
40.0000 meq | Freq: Once | ORAL | Status: AC
  Administered 2017-01-18: 40 meq
  Filled 2017-01-18: qty 30

## 2017-01-18 NOTE — Progress Notes (Signed)
[facility] ADULT ICU REPLACEMENT PROTOCOL FOR AM LAB REPLACEMENT ONLY  The patient does apply for the [facility] Adult ICU Electrolyte Replacment Protocol based on the criteria listed below:   1. Is GFR >/= 40 ml/min? Yes.    Patient's GFR today is >60 2. Is urine output >/= 0.5 ml/kg/hr for the last 6 hours? Yes.   Patient's UOP is 1.2 ml/kg/hr3. Is BUN < 60 mg/dL? Yes.    Patient's BUN today is 12 4. Abnormal electrolyte(s): K 3.2 5. Ordered repletion with: protocol 6. If a panic level lab has been reported, has the CCM MD in charge been notified? No..   Physician:    Ronda Fairly A 01/18/2017 5:03 AM

## 2017-01-18 NOTE — Progress Notes (Signed)
Called [facility] and notified about K+ of 3.2. Waiting for orders to replace.   Dillian Feig E Reola Mosher, [place]

## 2017-01-18 NOTE — Progress Notes (Signed)
Patient's husband asking for eye drops due to dry eyes for patient. Called [facility] and gave message to RN who will speak with MD.

## 2017-01-18 NOTE — Progress Notes (Signed)
Subjective: Interval History: Awake, indicates she is uncomfortable.   Objective: Vital signs in last 24 hours: Temp:  [97.8 F (36.6 C)-98.7 F (37.1 C)] 98.7 F (37.1 C) (02/04 0300) Pulse Rate:  [72-106] 104 (02/04 0700) Resp:  [18-26] 22 (02/04 0700) BP: (98-179)/(54-97) 173/87 (02/04 0700) SpO2:  [95 %-100 %] 100 % (02/04 0700) FiO2 (%):  [40 %-50 %] 40 % (02/04 0400) Weight:  [171 lb 4.8 oz (77.7 kg)] 171 lb 4.8 oz (77.7 kg) (02/04 0300) Weight change: 14 lb 12.3 oz (6.7 kg)  Intake/Output from previous day: 02/03 0701 - 02/04 0700 In: 4020.7 [I.V.:1850.7; NG/GT:960; IV Piggyback:1210] Out: 1610 [Urine:1610] Intake/Output this shift: No intake/output data recorded.  General appearance: cooperative, mildly obese and pale Resp: rales bibasilar Cardio: S1, S2 normal and systolic murmur: holosystolic 2/6, blowing at apex GI: obese, pos bs, soft Extremities: extremities normal, atraumatic, no cyanosis or edema  Lab Results:  Recent Labs  01/17/17 0433  WBC 20.6*  HGB 10.2*  HCT 30.2*  PLT 229   BMET:   Recent Labs  01/17/17 0433 01/18/17 0358  NA 131*  131* 128*  K 3.6 3.2*  CL 104 98*  CO2 23 24  GLUCOSE 135* 196*  BUN 15 12  CREATININE 0.75 0.70  CALCIUM 8.0* 7.9*   No results for input(s): PTH in the last 72 hours. Iron Studies: No results for input(s): IRON, TIBC, TRANSFERRIN, FERRITIN in the last 72 hours.  Studies/Results: Dg Chest Port 1 View  Result Date: 01/18/2017 CLINICAL DATA:  Pneumonia. EXAM: PORTABLE CHEST 1 VIEW COMPARISON:  January 17, 2017 FINDINGS: Support apparatus including the ETT is stable. No pneumothorax. Infiltrate in the right lung base persist but is mildly improved. Increased opacity in the left lung base obscuring the left hemidiaphragm. No other change. IMPRESSION: 1. Stable support apparatus. 2. Persistent but mildly improved right basilar infiltrate. 3. Increased infiltrate in the left base. Electronically Signed   By:  Dorise Bullion III M.D   On: 01/18/2017 07:49   Dg Chest Port 1 View  Result Date: 01/17/2017 CLINICAL DATA:  66 year old female admitted for lower extremity weakness, worsening myalgia and inability to walk. Recent strep pharyngitis. Suspected Guillain-Barre. Deterioration of respiratory stress status, subsequently intubated. Possible aspiration. Initial encounter. EXAM: PORTABLE CHEST 1 VIEW COMPARISON:  01/15/2017 and earlier. FINDINGS: Portable AP semi upright view at 0513 hours. Stable endotracheal tube tip at the level the clavicles. Enteric tube courses to the left abdomen, tip not included. Kyphotic positioning. Continued dense and confluent retrocardiac opacity. Increased patchy left perihilar opacity. Mildly increased patchy and veiling right lung opacity. No pneumothorax. No pulmonary edema. Stable cardiac size and mediastinal contours. IMPRESSION: 1.  Stable lines and tubes. 2. Continued left lower lobe collapse or consolidation. Interval worsening left perihilar and right lung base opacity which may represent a combination of airspace disease and small pleural effusion. Suspect bilateral pneumonia which could be the sequelae of aspiration in this clinical setting. Electronically Signed   By: Genevie Ann M.D.   On: 01/17/2017 07:17    I have reviewed the patient's current medications.  Assessment/Plan: 1 Hyponatremia with Na down from 131 to 128 likely due to elevated glucose, UOSM better indicating vol was primary role. Will re-check UOSM today. Patient actually hypertensive off pressors. Likely due to volume repletion. D/c IVF. Will give Lasix 80 mg IV x once. This will also help Na.  2 GB per neuro 3 Pneu asp per CCM 4 VDRF  5 Chronic pain  P d/c NS, Lasix,  follow up UOSM     LOS: 5 days   Melina Schools 01/18/2017,7:54 AM I have seen and examined this patient and agree with the plan of care seen, eval,examined,discussed with primary service and resident. .  Nina Hoar  L 01/18/2017, 12:19 PM

## 2017-01-18 NOTE — Progress Notes (Signed)
Patient heart rate dropped to 46, RN went to room and woke patient up, HR went back to 80s in a flutter. CCM and neuro paged and aware. EKG being obtained. BMET and Mag sent. Will continue to monitor closely.

## 2017-01-18 NOTE — Progress Notes (Signed)
Name: Joanne Morrison MRN: [id number] DOB: [birth year]    ADMISSION DATE:  01/12/2017 CONSULTATION DATE:  2/1  REFERRING MD :  Ghimire ([facility])   CHIEF COMPLAINT:  Dyspnea, potential for respiratory compromise   BRIEF PATIENT DESCRIPTION: 66yo female with hx chronic cystitis, depression/anxiety who presented 1/29 with L>R BLE weakness, LUE weakness and worsening myalgias x 3-4 days.  She was recently dx with strep pharyngitis and completed a course of zithromax.   She was seen in consultation by neurology and felt her s/s were most c/w guillain barre. She was started on IVIG 1/30.  On 2/1 she developed increased weakness, worsening NIF and difficulty with secretions and PCCM consulted with concern for potential deterioration of her respiratory status.      SUBJECTIVE:  No significant events overnight  VITAL SIGNS:   Temp:  [97.8 F (36.6 C)-98.7 F (37.1 C)] 98.7 F (37.1 C) (02/04 0300) Pulse Rate:  [72-106] 104 (02/04 0700) Resp:  [18-26] 22 (02/04 0700) BP: (98-179)/(54-97) 173/87 (02/04 0700) SpO2:  [95 %-100 %] 100 % (02/04 0700) FiO2 (%):  [40 %-50 %] 40 % (02/04 0400) Weight:  [171 lb 4.8 oz (77.7 kg)] 171 lb 4.8 oz (77.7 kg) (02/04 0300)  HEMODYNAMICS:    VENTILATOR SETTINGS: Vent Mode: PRVC FiO2 (%):  [40 %-50 %] 40 % Set Rate:  [20 bmp] 20 bmp Vt Set:  [450 mL] 450 mL PEEP:  [5 cmH20] 5 cmH20 Plateau Pressure:  [18 cmH20-20 cmH20] 20 cmH20  INTAKE / OUTPUT:  Intake/Output Summary (Last 24 hours) at 01/18/17 0759 Last data filed at 01/18/17 0700  Gross per 24 hour  Intake          4020.73 ml  Output             1610 ml  Net          2410.73 ml     General appearance:  66 year old  female, well nourished NAD Eyes: anicteric sclerae , moist conjunctivae; PERRL, EOMI bilaterally. Mouth:  membranes and no mucosal ulcerations; normal hard and soft palate, orally intubated Neck: Trachea midline; neck supple, no JVD Lungs/chest: scattered rhonchi, with normal  respiratory effort and no intercostal retractions CV: RRR, no MRGs  Abdomen: Soft, non-tender; no masses or HSM Extremities: bilateral peripheral edema or extremity lymphadenopathy Skin: Normal temperature, turgor and texture; no rash, ulcers or subcutaneous nodules Neuro/Psych: Appropriate affect, alert, still profoundly weak, can shrug shoulders but that's about it  LABS:  BMET  Recent Labs Lab 01/16/17 0755  01/17/17 0013 01/17/17 0433 01/18/17 0358  NA 131*  < > 131* 131*  131* 128*  K 4.0  --   --  3.6 3.2*  CL 101  --   --  104 98*  CO2 21*  --   --  23 24  BUN 18  --   --  15 12  CREATININE 1.23*  --   --  0.75 0.70  GLUCOSE 120*  --   --  135* 196*  < > = values in this interval not displayed. Electrolytes  Recent Labs Lab 01/16/17 0755 01/16/17 1656 01/17/17 0433 01/18/17 0358  CALCIUM 8.1*  --  8.0* 7.9*  MG 1.7 2.3 1.8  --   PHOS 2.5 3.2 1.7* 2.5   CBC  Recent Labs Lab 01/13/17 0328 01/15/17 0404 01/17/17 0433  WBC 11.9* 22.3* 20.6*  HGB 14.2 14.1 10.2*  HCT 42.2 41.6 30.2*  PLT 355 355 229   Coag's No  results for input(s): APTT, INR in the last 168 hours.  Sepsis Markers  Recent Labs Lab 01/12/17 1225  LATICACIDVEN 2.22*   ABG  Recent Labs Lab 01/16/17 0838 01/17/17 0432 01/18/17 0420  PHART 7.308* 7.391 7.409  PCO2ART 36.7 38.4 37.8  PO2ART 68.0* 80.9* 105   Liver Enzymes  Recent Labs Lab 01/12/17 1204 01/18/17 0358  AST 28  --   ALT 18  --   ALKPHOS 46  --   BILITOT 0.9  --   ALBUMIN 4.0 1.6*   Cardiac Enzymes No results for input(s): TROPONINI, PROBNP in the last 168 hours.  Glucose  Recent Labs Lab 01/17/17 1211 01/17/17 1610 01/17/17 1954 01/17/17 2351 01/18/17 0341 01/18/17 0743  GLUCAP 163* 124* 101* 110* 153* 133*   Imaging Dg Chest Port 1 View  Result Date: 01/18/2017 CLINICAL DATA:  Pneumonia. EXAM: PORTABLE CHEST 1 VIEW COMPARISON:  January 17, 2017 FINDINGS: Support apparatus including the ETT  is stable. No pneumothorax. Infiltrate in the right lung base persist but is mildly improved. Increased opacity in the left lung base obscuring the left hemidiaphragm. No other change. IMPRESSION: 1. Stable support apparatus. 2. Persistent but mildly improved right basilar infiltrate. 3. Increased infiltrate in the left base. Electronically Signed   By: Dorise Bullion III M.D   On: 01/18/2017 07:49  I have independently reviewed the CXR this am: her support tubes are in good position. Her R>L airspace disease has improved on the right no sig change on left.   SIGNIFICANT EVENTS  IVIG 1/30, 1/31, 2/1, 2/2  STUDIES:  MR brain 1/29>>>neg LP 1/30>>>  CULTURES: CSF 1/30>>>neg BC x 2 2/1>>>  Respiratory culture 2/3: rare gpc pairs>>>  ANTIBIOTICS: Unasyn 2/1>>>2/3 vanc 2/3>>> Zosyn 2/3>>> Diflucan 1/31>>>  LINES/TUBES: ETT 2/1>>> Right IJ CVL 2/1>>>    ASSESSMENT / PLAN: Neurologic GBS -profoundly weak.  -now s/p 5d IVG.  Plan Cont IVIG per neuro If no improvement by mid-week on week of 5th then neuro considering PLEX Will have PT and OT see as she will need ROM regimen as well as possible foot drop boots etc...  Pulmonary  Acute hypoxic respiratory failure in setting of neuro-muscular weakness and aspiration PNA ->cxr improved. Major barrier will be NM weakness at this point.  Plan Daily assessment for wean but high likelihood she will need trach PAD protocol F/u CXR in am 2/5  Cardiovascular  Initially hypovolemic. Now euvolemic Plan Decrease IVFs Cont tele  Renal Hypo-osmolar hyponatremia. This was initially a mix of both appropriate and In-appropriate ADH response. Now that she is euvolemic suspect that there is element of SIADH which is common in GBS Hypokalemia  Plan NSL IVFs Lasix x 1 (per neuro) Replace K  F/u am labs  Gastrointestinal  Dysphagia  Plan Cont tube feeds Cont SUP (PPI VT)  Hematology Leukocytosis in setting of PNA Plan F/u am  cbc Cont [place] heparin   Infectious disease Pneumonia (aspiration) thrush Plan Cont nosocomial coverage for now. Narrow after sensitivities are completed Diflucan   Endocrine Hyperglycemia Plan Add ssi  Discussion/summary GBS w/ aspiration PNA.  Getting IVIG. Still awaiting clinical response. Cont current supportive care.  Lasix X 1 today (per renal). I suspect this is an element of SIADH at this point.  Have instructed her husband on ROM activity.  I think she will need trach   My critical care time 32 minutes.  Erick Colace ACNP-BC [place] Pager # [phone] OR # [phone] if no answer  ATTENDING NOTE / ATTESTATION NOTE :   I have discussed the case with the resident/APP  Marni Griffon NP.   I agree with the resident/APP's  history, physical examination, assessment, and plans.    I have edited the above note and modified it according to our agreed history, physical examination, assessment and plan.   Briefly, 66 year old female w/ recent dx of GBS exacerbation, intubated 2/2 resp failure related to generalized weakness.  Slowly improving. Concern for nosocomial infxn (HCAP). On D5. of IV IG. Still intubated. Very weak, deconditioned.   Vitals:  Vitals:   01/18/17 1400 01/18/17 1500 01/18/17 1600 01/18/17 1646  BP: 122/71 125/70 119/74 119/74  Pulse: 78 94 87 89  Resp: 20 20 20 20   Temp:  98.7 F (37.1 C)    TempSrc:  Oral    SpO2: 100% 100% 100% 99%  Weight:      Height:        Constitutional/General: chronically ill, intubated, sedated, not in any distress  Body mass index is 31.33 kg/m. Wt Readings from Last 3 Encounters:  01/18/17 77.7 kg (171 lb 4.8 oz)  10/15/16 72.7 kg (160 lb 5 oz)  12/05/13 75.7 kg (166 lb 12.8 oz)    HEENT: PERLA, anicteric sclerae. (-) Oral thrush. Intubated, ETT in place  Neck: No masses. Midline trachea. No JVD, (-) LAD. (-) bruits appreciated.  Respiratory/Chest: Grossly normal chest. (-) deformity.  (-) Accessory muscle use.  Poor effort Diminished BS on both lower lung zones. (-) wheezing,, rhonchi. Some crackles at bases (-) egophony  Cardiovascular: Regular rate and  rhythm, heart sounds normal, no murmur or gallops,  Trace peripheral edema  Gastrointestinal:  Normal bowel sounds. Soft, non-tender. No hepatosplenomegaly.  (-) masses.   Musculoskeletal:  Normal muscle tone.   Extremities: Grossly normal. (-) clubbing, cyanosis.  (-) edema  Skin: (-) rash,lesions seen.   Neurological/Psychiatric : sedated, intubated. CN grossly intact. (-) lateralizing signs.     CBC Recent Labs     01/17/17  0433  WBC  20.6*  HGB  10.2*  HCT  30.2*  PLT  229    Coag's No results for input(s): APTT, INR in the last 72 hours.  BMET Recent Labs     01/17/17  0433  01/18/17  0358  01/18/17  1247  NA  131*  131*  128*  128*  K  3.6  3.2*  4.3  CL  104  98*  96*  CO2  23  24  27   BUN  15  12  11   CREATININE  0.75  0.70  0.74  GLUCOSE  135*  196*  153*    Electrolytes Recent Labs     01/16/17  1656  01/17/17  0433  01/18/17  0358  01/18/17  1247  CALCIUM   --   8.0*  7.9*  8.3*  MG  2.3  1.8   --   1.7  PHOS  3.2  1.7*  2.5   --     Sepsis Markers No results for input(s): PROCALCITON, O2SATVEN in the last 72 hours.  Invalid input(s): LACTICACIDVEN  ABG Recent Labs     01/16/17  0838  01/17/17  0432  01/18/17  0420  PHART  7.308*  7.391  7.409  PCO2ART  36.7  38.4  37.8  PO2ART  68.0*  80.9*  105    Liver Enzymes Recent Labs     01/18/17  0358  ALBUMIN  1.6*  Cardiac Enzymes No results for input(s): TROPONINI, PROBNP in the last 72 hours.  Glucose Recent Labs     01/17/17  1954  01/17/17  2351  01/18/17  0341  01/18/17  0743  01/18/17  1145  01/18/17  1623  GLUCAP  101*  110*  153*  133*  159*  112*    Imaging Dg Chest Port 1 View  Result Date: 01/18/2017 CLINICAL DATA:  Pneumonia. EXAM: PORTABLE CHEST 1 VIEW COMPARISON:  January 17, 2017 FINDINGS: Support apparatus including the ETT is stable. No pneumothorax. Infiltrate in the right lung base persist but is mildly improved. Increased opacity in the left lung base obscuring the left hemidiaphragm. No other change. IMPRESSION: 1. Stable support apparatus. 2. Persistent but mildly improved right basilar infiltrate. 3. Increased infiltrate in the left base. Electronically Signed   By: Dorise Bullion III M.D   On: 01/18/2017 07:49   Dg Chest Port 1 View  Result Date: 01/17/2017 CLINICAL DATA:  66 year old female admitted for lower extremity weakness, worsening myalgia and inability to walk. Recent strep pharyngitis. Suspected Guillain-Barre. Deterioration of respiratory stress status, subsequently intubated. Possible aspiration. Initial encounter. EXAM: PORTABLE CHEST 1 VIEW COMPARISON:  01/15/2017 and earlier. FINDINGS: Portable AP semi upright view at 0513 hours. Stable endotracheal tube tip at the level the clavicles. Enteric tube courses to the left abdomen, tip not included. Kyphotic positioning. Continued dense and confluent retrocardiac opacity. Increased patchy left perihilar opacity. Mildly increased patchy and veiling right lung opacity. No pneumothorax. No pulmonary edema. Stable cardiac size and mediastinal contours. IMPRESSION: 1.  Stable lines and tubes. 2. Continued left lower lobe collapse or consolidation. Interval worsening left perihilar and right lung base opacity which may represent a combination of airspace disease and small pleural effusion. Suspect bilateral pneumonia which could be the sequelae of aspiration in this clinical setting. Electronically Signed   By: Genevie Ann M.D.   On: 01/17/2017 07:17    Assessment/Pan:   GBS exacerbation.  - cont IVIG per Neuro. Day 5 today. Plan for 7 days and assess. May need plasmapheresis. - keep intubated for resp support   Acute hypoxemic resp fx 2/2 Unable to protect airway + HCAP - f/u cultures - cont  vanc and  zosyn   Hyponatremia - improved - cont IVF   Keep  On heparin sq for dvt prophylaxis. Cont TF.  No family at bedside. Discussed plan with pt.    I spent  30  minutes of Critical Care time with this patient today. This is my time spent independent of the APP or resident.    Monica Becton, MD 01/18/2017, 4:55 PM [facility] Pager [phone] After 3 pm or if no answer, call [phone]

## 2017-01-18 NOTE — Progress Notes (Signed)
Subjective: She appears relatively stable compared to yesterday.   Exam: Vitals:   01/18/17 0700 01/18/17 0800  BP: (!) 173/87 (!) 176/91  Pulse: (!) 104 (!) 107  Resp: (!) 22 20  Temp:  98.4 F (36.9 C)   Gen: In bed, NAD Resp: non-labored breathing, no acute distress Abd: soft, nt  Neuro: MS: Awake, alert, interactive and appropriate CN: Extra ocular movements intact. Facial weakness present on the left.  Motor:      Right  Left Elbow Flexion   1/5  2/5 Elbow Extension  1/5  2/5 Wrist Flexion   3/5  3/5 Wrist Extension  3/5  3/5 Grip      4-/5  4/5  Hip Flexion   0/5  0/5 Knee Flexion   2/5  0/5 Knee Extension  2/5  0/5 Ankle Dorsiflexion  1/5  1/5 Ankle Plantarflexion  2/5  2/5  Able to wiggle toes bilaterally    DTR: She has absent reflexes diffusely   Pertinent Labs: Na 128  Impression: 66 year old female with progressive weakness and numbness and decreased reflexes in the setting of albuminocytologic dissociation, e.g. Guillian Barre syndrome. Given the continued progression despite treatment, and the severity of her presentation, I favor an additional 1g/kg given over two days and this will begin today. I am hopeful that she may have hit her nadir at this point, but difficult to be certain.   There have never been dose finding studies of IVIG and multiple studies suggest that additional dosing can be beneficial. The decision to change to PLEX or not is not straight forward. I think that her level of worsening is lessening though not sure that she has truly hit her nadir. If we do purse PLEX, I would fear that we would remove the benefit of IVIG and there is still a chance that she could respond.  Recommendations: 1)  IVIG, completed 2/kg yesterday, will give additional 1g/kg over two days. Marland Kitchen  2) Appreciate CCM consult 3) continue NIF/VC, daily  Roland Rack, MD [facility] [phone]  If 7pm- 7am, please page neurology on call as listed  in [place].

## 2017-01-18 NOTE — Progress Notes (Addendum)
eLink Physician-Brief Progress Note Patient Name: Joanne Morrison DOB: Nov 01, 1951 MRN: [id number]   Date of Service  01/18/2017  HPI/Events of Note  Husband requests eye drops.  eICU Interventions  Will order: Clear eyes 1-2 drops to both eyes now and QID.      Intervention Category Intermediate Interventions: Other:  Joanne Morrison 01/18/2017, 5:04 PM

## 2017-01-19 ENCOUNTER — Inpatient Hospital Stay (HOSPITAL_COMMUNITY): Payer: Medicare Other

## 2017-01-19 DIAGNOSIS — J9601 Acute respiratory failure with hypoxia: Secondary | ICD-10-CM | POA: Diagnosis not present

## 2017-01-19 DIAGNOSIS — B370 Candidal stomatitis: Secondary | ICD-10-CM | POA: Diagnosis not present

## 2017-01-19 DIAGNOSIS — G8250 Quadriplegia, unspecified: Secondary | ICD-10-CM | POA: Diagnosis not present

## 2017-01-19 DIAGNOSIS — E871 Hypo-osmolality and hyponatremia: Secondary | ICD-10-CM | POA: Diagnosis not present

## 2017-01-19 DIAGNOSIS — G610 Guillain-Barre syndrome: Secondary | ICD-10-CM | POA: Diagnosis not present

## 2017-01-19 DIAGNOSIS — I480 Paroxysmal atrial fibrillation: Secondary | ICD-10-CM | POA: Diagnosis not present

## 2017-01-19 DIAGNOSIS — J690 Pneumonitis due to inhalation of food and vomit: Secondary | ICD-10-CM | POA: Diagnosis not present

## 2017-01-19 DIAGNOSIS — I4892 Unspecified atrial flutter: Secondary | ICD-10-CM | POA: Diagnosis not present

## 2017-01-19 DIAGNOSIS — I959 Hypotension, unspecified: Secondary | ICD-10-CM | POA: Diagnosis not present

## 2017-01-19 DIAGNOSIS — E870 Hyperosmolality and hypernatremia: Secondary | ICD-10-CM | POA: Diagnosis not present

## 2017-01-19 LAB — COMPREHENSIVE METABOLIC PANEL
ALK PHOS: 435 U/L — AB (ref 38–126)
ALT: 191 U/L — ABNORMAL HIGH (ref 14–54)
ANION GAP: 6 (ref 5–15)
AST: 245 U/L — ABNORMAL HIGH (ref 15–41)
Albumin: 1.5 g/dL — ABNORMAL LOW (ref 3.5–5.0)
BUN: 15 mg/dL (ref 6–20)
CALCIUM: 8.5 mg/dL — AB (ref 8.9–10.3)
CHLORIDE: 97 mmol/L — AB (ref 101–111)
CO2: 29 mmol/L (ref 22–32)
Creatinine, Ser: 0.8 mg/dL (ref 0.44–1.00)
GFR calc Af Amer: >60 mL/min (ref >60)
GFR calc non Af Amer: >60 mL/min (ref >60)
Glucose, Bld: 120 mg/dL — ABNORMAL HIGH (ref 65–99)
Potassium: 3.5 mmol/L (ref 3.5–5.1)
SODIUM: 132 mmol/L — AB (ref 135–145)
Total Bilirubin: 0.7 mg/dL (ref 0.3–1.2)
Total Protein: 7.9 g/dL (ref 6.5–8.1)

## 2017-01-19 LAB — VANCOMYCIN, TROUGH: VANCOMYCIN TR: 16 ug/mL (ref 15–20)

## 2017-01-19 LAB — GLUCOSE, CAPILLARY
GLUCOSE-CAPILLARY: 106 mg/dL — AB (ref 65–99)
GLUCOSE-CAPILLARY: 139 mg/dL — AB (ref 65–99)
Glucose-Capillary: 127 mg/dL — ABNORMAL HIGH (ref 65–99)
Glucose-Capillary: 135 mg/dL — ABNORMAL HIGH (ref 65–99)
Glucose-Capillary: 137 mg/dL — ABNORMAL HIGH (ref 65–99)
Glucose-Capillary: 146 mg/dL — ABNORMAL HIGH (ref 65–99)

## 2017-01-19 LAB — PHOSPHORUS: PHOSPHORUS: 3.8 mg/dL (ref 2.5–4.6)

## 2017-01-19 LAB — CBC
HCT: 29.7 % — ABNORMAL LOW (ref 36.0–46.0)
Hemoglobin: 10.1 g/dL — ABNORMAL LOW (ref 12.0–15.0)
MCH: 26.6 pg (ref 26.0–34.0)
MCHC: 34 g/dL (ref 30.0–36.0)
MCV: 78.2 fL (ref 78.0–100.0)
PLATELETS: 297 10*3/uL (ref 150–400)
RBC: 3.8 MIL/uL — ABNORMAL LOW (ref 3.87–5.11)
RDW: 14.8 % (ref 11.5–15.5)
WBC: 15.6 10*3/uL — ABNORMAL HIGH (ref 4.0–10.5)

## 2017-01-19 MED ORDER — FUROSEMIDE 10 MG/ML IJ SOLN
40.0000 mg | Freq: Once | INTRAMUSCULAR | Status: AC
  Administered 2017-01-19: 40 mg via INTRAVENOUS
  Filled 2017-01-19: qty 4

## 2017-01-19 MED ORDER — ARTIFICIAL TEARS OP OINT
TOPICAL_OINTMENT | OPHTHALMIC | Status: DC | PRN
  Administered 2017-01-19 – 2017-01-24 (×4): via OPHTHALMIC
  Administered 2017-01-24: 1 via OPHTHALMIC
  Administered 2017-01-24: 08:00:00 via OPHTHALMIC
  Administered 2017-01-25: 1 via OPHTHALMIC
  Filled 2017-01-19 (×2): qty 3.5

## 2017-01-19 MED ORDER — DILTIAZEM LOAD VIA INFUSION
15.0000 mg | Freq: Once | INTRAVENOUS | Status: AC
  Administered 2017-01-19: 15 mg via INTRAVENOUS
  Filled 2017-01-19: qty 15

## 2017-01-19 MED ORDER — LABETALOL HCL 5 MG/ML IV SOLN
10.0000 mg | INTRAVENOUS | Status: DC | PRN
  Administered 2017-01-19 – 2017-01-26 (×6): 10 mg via INTRAVENOUS
  Filled 2017-01-19 (×7): qty 4

## 2017-01-19 MED ORDER — POTASSIUM CHLORIDE 20 MEQ/15ML (10%) PO SOLN
20.0000 meq | ORAL | Status: AC
  Administered 2017-01-19 (×2): 20 meq
  Filled 2017-01-19 (×2): qty 15

## 2017-01-19 MED ORDER — DILTIAZEM HCL 100 MG IV SOLR
5.0000 mg/h | INTRAVENOUS | Status: DC
  Administered 2017-01-19: 5 mg/h via INTRAVENOUS
  Filled 2017-01-19: qty 100

## 2017-01-19 NOTE — Progress Notes (Signed)
Name: Joanne Morrison MRN: [id number] DOB: [birth year]    ADMISSION DATE:  01/12/2017 CONSULTATION DATE:  01/15/2017  REFERRING MD :  Sloan Leiter ([facility])   CHIEF COMPLAINT:  Dyspnea, potential for respiratory compromise   BRIEF PATIENT DESCRIPTION:  66 yo female presented with weakness and myalgias for 3 to 4 days.  She had recently completed therapy for Strep throat.  Concern was that she developed Guillain Barre syndrome, and she was started on IVIG 1/30.  She progressed to respiratory failure on 2/01 and required intubation.     SUBJECTIVE:  Remains on full vent support.  VITAL SIGNS: BP (!) 153/86   Pulse 95   Temp 99 F (37.2 C) (Axillary)   Resp 20   Ht 5\' 2"  (1.575 m)   Wt 169 lb 5 oz (76.8 kg)   SpO2 97%   BMI 30.97 kg/m   INTAKE / OUTPUT: I/O last 3 completed shifts: In: 5230.4 [I.V.:2210.4; NG/GT:1460; IV Piggyback:1560] Out: [id number] [Urine:6160]  General: ill appearing Neuro: weak in arms/legs Eyes: pupils reactive Cardiac: regular, tachycardic Chest: b/l rhonchi Abd: soft, non tender Ext: 1+ edema Skin no rashes  LABS:  BMET  Recent Labs Lab 01/18/17 0358 01/18/17 1247 01/19/17 0345  NA 128* 128* 132*  K 3.2* 4.3 3.5  CL 98* 96* 97*  CO2 24 27 29   BUN 12 11 15   CREATININE 0.70 0.74 0.80  GLUCOSE 196* 153* 120*   Electrolytes  Recent Labs Lab 01/16/17 1656 01/17/17 0433 01/18/17 0358 01/18/17 1247 01/19/17 0345  CALCIUM  --  8.0* 7.9* 8.3* 8.5*  MG 2.3 1.8  --  1.7  --   PHOS 3.2 1.7* 2.5  --  3.8   CBC  Recent Labs Lab 01/15/17 0404 01/17/17 0433 01/19/17 0345  WBC 22.3* 20.6* 15.6*  HGB 14.1 10.2* 10.1*  HCT 41.6 30.2* 29.7*  PLT 355 229 297   Coag's No results for input(s): APTT, INR in the last 168 hours.  Sepsis Markers  Recent Labs Lab 01/12/17 1225  LATICACIDVEN 2.22*   ABG  Recent Labs Lab 01/16/17 0838 01/17/17 0432 01/18/17 0420  PHART 7.308* 7.391 7.409  PCO2ART 36.7 38.4 37.8  PO2ART 68.0* 80.9* 105     Liver Enzymes  Recent Labs Lab 01/12/17 1204 01/18/17 0358 01/19/17 0345  AST 28  --  245*  ALT 18  --  191*  ALKPHOS 46  --  435*  BILITOT 0.9  --  0.7  ALBUMIN 4.0 1.6* 1.5*   Cardiac Enzymes No results for input(s): TROPONINI, PROBNP in the last 168 hours.  Glucose  Recent Labs Lab 01/18/17 0743 01/18/17 1145 01/18/17 1623 01/18/17 2036 01/18/17 2326 01/19/17 0349  GLUCAP 133* 159* 112* 134* 148* 106*   Imaging Dg Chest Port 1 View  Result Date: 01/18/2017 CLINICAL DATA:  Pneumonia. EXAM: PORTABLE CHEST 1 VIEW COMPARISON:  January 17, 2017 FINDINGS: Support apparatus including the ETT is stable. No pneumothorax. Infiltrate in the right lung base persist but is mildly improved. Increased opacity in the left lung base obscuring the left hemidiaphragm. No other change. IMPRESSION: 1. Stable support apparatus. 2. Persistent but mildly improved right basilar infiltrate. 3. Increased infiltrate in the left base. Electronically Signed   By: Dorise Bullion III M.D   On: 01/18/2017 07:49    SIGNIFICANT EVENTS  IVIG 1/30, 1/31, 2/1, 2/2  STUDIES:  MR brain 1/29>>>negative LP 1/30>>>glucose 55, RBC 6, WBC 3, protein 79  CULTURES: CSF 1/30>>>negative BC x 2 2/1>>> negative Respiratory culture  2/3 >>> rare gpc pairs>>>  ANTIBIOTICS: Diflucan 1/31 >> Unasyn 2/1>>>2/3 vanc 2/3>>> Zosyn 2/3>>>  LINES/TUBES: ETT 2/1>>> Right IJ CVL 2/1>>>   ASSESSMENT / PLAN:  Acute hypoxic respiratory failure. - full vent support until neuro status improved - discussed with husband about considering early tracheostomy  Guillain Barre syndrome. - IVIG per neurology - if no improvement, then consider plasma exchange  Aspiration pneumonia. Trush. - Day 5 of Abx - Diflucan  Hypertension. Hypervolemia. - lasix 40 mg IV x one on 2/05 - prn labetalol  Anemia of critical illness. - f/u CBC  DVT prophylaxis - SQ heparin SUP - protonix Nutrition - tube feeds Goals of  care - Full code  CC time 32 minutes  Updated pt's husband at bedside  Chesley Mires, MD [place] 01/19/2017, 9:08 AM Pager:  [phone] After 3pm call: [phone]

## 2017-01-19 NOTE — Progress Notes (Signed)
Pharmacy Antibiotic Note  Joanne Morrison is a 66 y.o. female admitted on 01/12/2017 with suspected Guillian Barre syndrome, suspected aspiration PNA.  Pharmacy has been consulted for vancomycin and zosyn dosing.  WBC improving, afebrile.  Scr stable.  Plan: 1. Continue Vancomycin 1g IV q 12 hrs- will check trough level tonight. 2. Zosyn 3.375g IV q 8 hrs (4 hr extended interval infusion) 3. Monitor cultures, renal function and clinical course.  Height: 5\' 2"  (157.5 cm) Weight: 169 lb 5 oz (76.8 kg) IBW/kg (Calculated) : 50.1  Temp (24hrs), Avg:98.9 F (37.2 C), Min:98.1 F (36.7 C), Max:100 F (37.8 C)   Recent Labs Lab 01/13/17 0328 01/15/17 0404  01/16/17 0755 01/17/17 0433 01/18/17 0358 01/18/17 1247 01/19/17 0345  WBC 11.9* 22.3*  --   --  20.6*  --   --  15.6*  CREATININE 0.95 0.72  < > 1.23* 0.75 0.70 0.74 0.80  < > = values in this interval not displayed.  Estimated Creatinine Clearance: 68.2 mL/min (by C-G formula based on SCr of 0.8 mg/dL).    Allergies  Allergen Reactions  . Olanzapine Other (See Comments)    STIFF JOINTS, EXTREMITY WEAKNESS, MEMORY LOSS, LOSS OF BALANCE  . Sumatriptan Other (See Comments)    REACTION: altered mental status  . Graciella Freer D-S] Other (See Comments)    lethargic    Antimicrobials this admission:  Unasyn 2/1 >> 2/3 Fluconazole 2/1 >>  Vanc >> Zosyn >>  Dose adjustments this admission:    Microbiology results:  2/1 BCx: ngtd 1/29 BCx: neg 1/30 CSF: neg ** UCx: ** ** Sputum: ** 2/1 MRSA PCR: *neg 2/3 Sputum > pending   Thank you for allowing pharmacy to be a part of this patient's care.  Uvaldo Rising, BCPS  Clinical Pharmacist Pager [phone]  01/19/2017 1:58 PM

## 2017-01-19 NOTE — Progress Notes (Signed)
Occupational Therapy Treatment Patient Details Name: Joanne Morrison MRN: [id number] DOB: March 25, 1951 Today's Date: 01/19/2017    History of present illness Patient is a 66 y.o. female with medical history significant of chronic low back pain, anxiety, depression, hypertension-recently had sorethroat (+strep) presented to the ED on 1/29 with lower ext weakness,tingling numbess that started in the lower extremities and subsequently started involving her upper extremities. Unable to elicit DTR in the lower ext. MRI of the entire spine negative. Concern for GBS-started on empiric IVIG by neurology.Developed respiratory failure and was intubated 2/1 and trasnferred to ICU.   OT comments  Per nsg, with with new onset A-fib with RVR and asked to limit session to ROM and positioning. Nsg states that pt complained of pain and developed RVR when sedation lifted. Husband asking many questions. Began educating family on BUE PROM and positioning to reduce dependent edema and improve positioning of B feet and BUE. Pt will need B PRAFO splints for feet. Pt left in modified chair position. VSS. Will continue to follow and progress as tolerated. Will re-establish goals next session.   Follow Up Recommendations  CIR;Supervision/Assistance - 24 hour    Equipment Recommendations  Other (comment) (TBA)    Recommendations for Other Services Rehab consult    Precautions / Restrictions Precautions Precautions: Fall Precaution Comments: vent       Mobility Bed Mobility               General bed mobility comments: unable per nsg request  Transfers                      Balance                                   ADL                                         General ADL Comments: Total A at this time      Vision                     Perception     Praxis      Cognition    Nsg states pt following commands when sedation lifted                         Extremity/Trunk Assessment     Will further assess. Nsg states minimal BUE movement when sedation lifted. B footdrop.          Exercises Other Exercises Other Exercises: BUE PROM - educated husband on BUE PROM and positioning Other Exercises: B rolls positioned to increase dorsiflexion and appropriate positioning of feet   Shoulder Instructions       General Comments      Pertinent Vitals/ Pain       Pain Assessment: Faces Faces Pain Scale: No hurt  Home Living                                          Prior Functioning/Environment              Frequency  Min 2X/week        Progress Toward  Goals  OT Goals(current goals can now be found in the care plan section)  Progress towards OT goals: OT to reassess next treatment  Acute Rehab OT Goals Patient Stated Goal: unable to state. Per family - to go to CIR OT Goal Formulation: With family Time For Goal Achievement: 01/28/17 Potential to Achieve Goals: Good ADL Goals Pt Will Perform Grooming: with min assist;standing Pt Will Transfer to Toilet: with min assist;ambulating;bedside commode Pt Will Perform Toileting - Clothing Manipulation and hygiene: with min assist;sit to/from stand Pt/caregiver will Perform Home Exercise Program: Increased ROM;Increased strength;Both right and left upper extremity;With Supervision;With written HEP provided Additional ADL Goal #1: Pt will complete bed mobility at modified independent level in preparation for ADL tasks.  Plan Discharge plan remains appropriate    Co-evaluation                 End of Session     Activity Tolerance Treatment limited secondary to medical complications (Comment)   Patient Left in bed;with call bell/phone within reach;with family/visitor present;with SCD's reapplied (modified chair position)   Nurse Communication Other (comment) (positioning/need for PRAFOs)        Time: [id number] OT Time Calculation  (min): 28 min  Charges: OT General Charges $OT Visit: 1 Procedure OT Treatments $Therapeutic Activity: 23-37 mins  Cyrstal Leitz,HILLARY 01/19/2017, 1:45 PM   [facility] [place], OT/L  [phone] 01/19/2017

## 2017-01-19 NOTE — Progress Notes (Signed)
Orthopedic Tech Progress Note Patient Details:  Joanne Morrison [birth year] [id number]  Ortho Devices Ortho Device/Splint Location: Bilateral Prafo boots Ortho Device/Splint Interventions: Application   [place] Pink 01/19/2017, 5:11 PM

## 2017-01-19 NOTE — Progress Notes (Addendum)
Subjective: Still complaining of back pain and some pain in her legs. Left sided weakness which then progressed over to the rest of the body. No acute events overnight.  Exam:     Vitals:   01/19/17 0800 01/19/17 0805  BP: (!) 178/101   Pulse: 89   Resp: 20   Temp:  98.1 F (36.7 C)   Gen: In bed, NAD Resp: non-labored breathing, no acute distress Abd: soft, nt  Neuro: MS: Awake and alert. Following simple commands. Cn: Extraocular movements intact. Left lower motor neuron pattern facial droop noted. Motor: Quadriparesis with some preservation of distal muscles. Proximally 1/5 and distally 3/5 in upper extremities. Lower extremities proximally 0/5 and distally 1/5. DTR: Absent reflexes throughout.  Impression: 66 years old female presented with quadroparesis and left facial palsy in the setting of normal neuro axis images. She has not had much improvement with 5 days of IVIG and 2 additional days of IVIG is initiated. There is a concern for possible Financial controller variant or [place] encephalitis although she does not have opthalmoparesis however does have left facial palsy.  Recommendations: -Sending off GQ1B antibodies to rule out these possibilities. -Management will not change and will continue with IVIG as recommended. -Typically improvement takes a while with IVIG so would not rush to PLEX at this point. -I think EMG/Brice study is useful in prognosticating recovery.  Neurology will continue to follow along. Please call with questions.

## 2017-01-19 NOTE — Progress Notes (Signed)
Physical Therapy Treatment Patient Details Name: Zalea Govea MRN: [id number] DOB: 05-23-1951 Today's Date: 01/19/2017    History of Present Illness Patient is a 66 y.o. female with medical history significant of chronic low back pain, anxiety, depression, hypertension-recently had sorethroat (+strep) presented to the ED on 1/29 with lower ext weakness,tingling numbess that started in the lower extremities and subsequently started involving her upper extremities. Unable to elicit DTR in the lower ext. MRI of the entire spine negative. Concern for GBS-started on empiric IVIG by neurology.Developed respiratory failure and was intubated 2/1 and trasnferred to ICU.    PT Comments    Pt now intubated and in ICU with respiratory/medical decline. Goals reset and will continue to follow and progress as able.  Follow Up Recommendations  CIR (depending on medical issues)     Equipment Recommendations  Other (comment) (to be determined)    Recommendations for Other Services       Precautions / Restrictions Precautions Precautions: Fall Precaution Comments: vent Restrictions Weight Bearing Restrictions: No    Mobility  Bed Mobility               General bed mobility comments: unable per nsg request  Transfers                    Ambulation/Gait                 Stairs            Wheelchair Mobility    Modified Rankin (Stroke Patients Only)       Balance                                    Cognition Arousal/Alertness: Lethargic;Suspect due to medications (sedated)                          Exercises Low Level/ICU Exercises Hip ABduction/ADduction: PROM;Both;10 reps;Supine Heel Slides: PROM;Both;10 reps;Supine Other Exercises Other Exercises: BUE PROM - educated husband on BUE PROM and positioning Other Exercises: B rolls positioned to increase dorsiflexion and appropriate positioning of feet Other Exercises: Passive  hip external and intrernal rotation    General Comments        Pertinent Vitals/Pain Pain Assessment: Faces Faces Pain Scale: No hurt    Home Living                      Prior Function            PT Goals (current goals can now be found in the care plan section) Acute Rehab PT Goals Patient Stated Goal: unable to state. Per family - to go to CIR PT Goal Formulation: Patient unable to participate in goal setting Time For Goal Achievement: 02/02/17 Potential to Achieve Goals: Fair Progress towards PT goals: Goals downgraded-see care plan    Frequency    Min 3X/week      PT Plan Discharge plan needs to be updated    Co-evaluation             End of Session Equipment Utilized During Treatment: Oxygen (vent) Activity Tolerance: Patient limited by lethargy;Treatment limited secondary to medical complications (Comment) Patient left: in bed;with call bell/phone within reach     Time: 1425-1435 PT Time Calculation (min) (ACUTE ONLY): 10 min  Charges:  $Therapeutic Activity: 8-22 mins  G CodesShary Decamp [place] 01/19/2017, 3:31 PM [employer] PT [phone]

## 2017-01-19 NOTE — Progress Notes (Addendum)
Subjective: Interval History: Still having pain and weakness.   Objective: Vital signs in last 24 hours: Temp:  [97.6 F (36.4 C)-100 F (37.8 C)] 98.1 F (36.7 C) (02/05 0805) Pulse Rate:  [78-117] 89 (02/05 0800) Resp:  [19-28] 20 (02/05 0800) BP: (88-197)/(59-101) 178/101 (02/05 0800) SpO2:  [95 %-100 %] 98 % (02/05 0800) FiO2 (%):  [40 %] 40 % (02/05 0800) Weight:  [169 lb 5 oz (76.8 kg)] 169 lb 5 oz (76.8 kg) (02/05 0100) Weight change: -1 lb 15.8 oz (-0.9 kg)  Intake/Output from previous day: 02/04 0701 - 02/05 0700 In: 2126.3 [I.V.:726.3; NG/GT:1050; IV Piggyback:350] Out: 5755 [Urine:5755] Intake/Output this shift: Total I/O In: 24083.9 [I.V.:24013.9; NG/GT:70] Out: 125 [Urine:125]  General appearance: cooperative, mildly obese and pale, alert and following some commands  Resp: rales bibasilar Cardio: S1, S2 normal and systolic murmur: holosystolic 2/6, blowing at apex GI: obese, pos bs, soft Extremities: extremities normal, atraumatic, no cyanosis or edema  Lab Results:  Recent Labs  01/17/17 0433 01/19/17 0345  WBC 20.6* 15.6*  HGB 10.2* 10.1*  HCT 30.2* 29.7*  PLT 229 297   BMET:   Recent Labs  01/18/17 1247 01/19/17 0345  NA 128* 132*  K 4.3 3.5  CL 96* 97*  CO2 27 29  GLUCOSE 153* 120*  BUN 11 15  CREATININE 0.74 0.80  CALCIUM 8.3* 8.5*   No results for input(s): PTH in the last 72 hours. Iron Studies: No results for input(s): IRON, TIBC, TRANSFERRIN, FERRITIN in the last 72 hours.  Studies/Results: Dg Chest Port 1 View  Result Date: 01/19/2017 CLINICAL DATA:  Shortness of breath, acute respiratory failure and pneumonia, Guillain-Barre syndrome EXAM: PORTABLE CHEST 1 VIEW COMPARISON:  Portable chest x-ray of January 18, 2017 FINDINGS: The lungs are reasonably well inflated. Bibasilar atelectasis or pneumonia persists. Small amount of pleural fluid on the left is suspected. The endotracheal tube tip lies 3.2 cm above the carina. The  esophagogastric tube tip in proximal port project below the inferior margin of the image. The right internal jugular venous catheter tip projects over the middle portion of the SVC. IMPRESSION: Stable appearance of the chest. The support tubes are in reasonable position. Bibasilar atelectasis or pneumonia. Electronically Signed   By: David  Martinique M.D.   On: 01/19/2017 07:04   Dg Chest Port 1 View  Result Date: 01/18/2017 CLINICAL DATA:  Pneumonia. EXAM: PORTABLE CHEST 1 VIEW COMPARISON:  January 17, 2017 FINDINGS: Support apparatus including the ETT is stable. No pneumothorax. Infiltrate in the right lung base persist but is mildly improved. Increased opacity in the left lung base obscuring the left hemidiaphragm. No other change. IMPRESSION: 1. Stable support apparatus. 2. Persistent but mildly improved right basilar infiltrate. 3. Increased infiltrate in the left base. Electronically Signed   By: Dorise Bullion III M.D   On: 01/18/2017 07:49    I have reviewed the patient's current medications.  Assessment/Plan: 1 Hyponatremia: Na has improved from 128 to 132 after d/c of IVF and Lasix 80 mg x1. UOSM had initially improved indicating that hypovolemia was playing a primary role. However, on repeat it increased again to 601. May be component of SIADH given GB.  2 GB per neuro 3 Pneu asp per CCM 4 VDRF  5 Chronic pain    LOS: 6 days   Melina Schools 01/19/2017,8:40 AM  Renal Attending: Hyponatremia improving.  Exact etiology uncertain ?due to hyperproteinemia from IVIG, hypovolemic hyponatremia or component of inappropriate ADH.  We will  sign off. Tresa Jolley C

## 2017-01-19 NOTE — Progress Notes (Signed)
Developed A fib with RVR.  Will start cardizem gtt.  Chesley Mires, MD [facility] 01/19/2017, 10:20 AM Pager:  [phone] After 3pm call: [phone]

## 2017-01-19 NOTE — Progress Notes (Signed)
Pharmacy Antibiotic Note  Joanne Morrison is a 66 y.o. female admitted on 01/12/2017 with suspected Guillian Barre syndrome, suspected aspiration PNA.  Pharmacy has been consulted for vancomycin dosing.  Trough within goal range.  Plan: Continue Vancomycin 1g IV q 12 hrs  Height: 5\' 2"  (157.5 cm) Weight: 169 lb 5 oz (76.8 kg) IBW/kg (Calculated) : 50.1  Temp (24hrs), Avg:98.4 F (36.9 C), Min:97.6 F (36.4 C), Max:99 F (37.2 C)   Recent Labs Lab 01/13/17 0328 01/15/17 0404  01/16/17 0755 01/17/17 0433 01/18/17 0358 01/18/17 1247 01/19/17 0345 01/19/17 2215  WBC 11.9* 22.3*  --   --  20.6*  --   --  15.6*  --   CREATININE 0.95 0.72  < > 1.23* 0.75 0.70 0.74 0.80  --   VANCOTROUGH  --   --   --   --   --   --   --   --  16  < > = values in this interval not displayed.  Estimated Creatinine Clearance: 68.2 mL/min (by C-G formula based on SCr of 0.8 mg/dL).    Phillis Knack, PharmD, BCPS   01/19/2017 11:47 PM

## 2017-01-19 NOTE — Progress Notes (Signed)
[facility] ADULT ICU REPLACEMENT PROTOCOL FOR AM LAB REPLACEMENT ONLY  The patient does apply for the [facility] Adult ICU Electrolyte Replacment Protocol based on the criteria listed below:   1. Is GFR >/= 40 ml/min? Yes.    Patient's GFR today is >60 2. Is urine output >/= 0.5 ml/kg/hr for the last 6 hours? Yes.   Patient's UOP is 2 ml/kg/hr 3. Is BUN < 60 mg/dL? Yes.    Patient's BUN today is 15 4. Abnormal electrolyte(s):K3.5 5. Ordered repletion with:Per protocol 6. If a panic level lab has been reported, has the CCM MD in charge been notified? Yes.  .   Physician:  Bernita Raisin, MD  [place] 01/19/2017 6:46 AM

## 2017-01-20 ENCOUNTER — Inpatient Hospital Stay (HOSPITAL_COMMUNITY): Payer: Medicare Other

## 2017-01-20 DIAGNOSIS — J9601 Acute respiratory failure with hypoxia: Secondary | ICD-10-CM | POA: Diagnosis not present

## 2017-01-20 DIAGNOSIS — J690 Pneumonitis due to inhalation of food and vomit: Secondary | ICD-10-CM

## 2017-01-20 DIAGNOSIS — E871 Hypo-osmolality and hyponatremia: Secondary | ICD-10-CM | POA: Diagnosis not present

## 2017-01-20 DIAGNOSIS — G610 Guillain-Barre syndrome: Secondary | ICD-10-CM | POA: Diagnosis not present

## 2017-01-20 DIAGNOSIS — E876 Hypokalemia: Secondary | ICD-10-CM

## 2017-01-20 LAB — GLUCOSE, CAPILLARY
GLUCOSE-CAPILLARY: 169 mg/dL — AB (ref 65–99)
Glucose-Capillary: 180 mg/dL — ABNORMAL HIGH (ref 65–99)
Glucose-Capillary: 136 mg/dL — ABNORMAL HIGH (ref 65–99)
Glucose-Capillary: 116 mg/dL — ABNORMAL HIGH (ref 65–99)

## 2017-01-20 LAB — RENAL FUNCTION PANEL
ANION GAP: 10 (ref 5–15)
Albumin: 1.6 g/dL — ABNORMAL LOW (ref 3.5–5.0)
BUN: 23 mg/dL — ABNORMAL HIGH (ref 6–20)
CHLORIDE: 94 mmol/L — AB (ref 101–111)
CO2: 28 mmol/L (ref 22–32)
Calcium: 8.7 mg/dL — ABNORMAL LOW (ref 8.9–10.3)
Creatinine, Ser: 0.82 mg/dL (ref 0.44–1.00)
GFR calc non Af Amer: >60 mL/min (ref >60)
Glucose, Bld: 135 mg/dL — ABNORMAL HIGH (ref 65–99)
Phosphorus: 4.7 mg/dL — ABNORMAL HIGH (ref 2.5–4.6)
Potassium: 3.9 mmol/L (ref 3.5–5.1)
Sodium: 132 mmol/L — ABNORMAL LOW (ref 135–145)

## 2017-01-20 LAB — CULTURE, RESPIRATORY W GRAM STAIN

## 2017-01-20 LAB — CULTURE, BLOOD (ROUTINE X 2)
Culture: NO GROWTH
Culture: NO GROWTH

## 2017-01-20 LAB — CBC
HCT: 28.9 % — ABNORMAL LOW (ref 36.0–46.0)
HEMOGLOBIN: 9.5 g/dL — AB (ref 12.0–15.0)
MCH: 25.9 pg — AB (ref 26.0–34.0)
MCHC: 32.9 g/dL (ref 30.0–36.0)
MCV: 78.7 fL (ref 78.0–100.0)
Platelets: 349 10*3/uL (ref 150–400)
RBC: 3.67 MIL/uL — ABNORMAL LOW (ref 3.87–5.11)
RDW: 15 % (ref 11.5–15.5)
WBC: 14.4 10*3/uL — ABNORMAL HIGH (ref 4.0–10.5)

## 2017-01-20 LAB — CULTURE, RESPIRATORY: CULTURE: NORMAL

## 2017-01-20 MED ORDER — MAGNESIUM SULFATE 2 GM/50ML IV SOLN
2.0000 g | Freq: Once | INTRAVENOUS | Status: AC
  Administered 2017-01-20: 2 g via INTRAVENOUS
  Filled 2017-01-20: qty 50

## 2017-01-20 MED ORDER — POTASSIUM CHLORIDE 20 MEQ/15ML (10%) PO SOLN
40.0000 meq | Freq: Three times a day (TID) | ORAL | Status: AC
  Administered 2017-01-20 (×2): 40 meq
  Filled 2017-01-20 (×2): qty 30

## 2017-01-20 MED ORDER — FUROSEMIDE 10 MG/ML IJ SOLN
40.0000 mg | Freq: Three times a day (TID) | INTRAMUSCULAR | Status: AC
  Administered 2017-01-20 (×2): 40 mg via INTRAVENOUS
  Filled 2017-01-20 (×2): qty 4

## 2017-01-20 NOTE — Progress Notes (Signed)
Name: Joanne Morrison MRN: [id number] DOB: 1951-06-02    ADMISSION DATE:  01/12/2017 CONSULTATION DATE:  01/15/2017  REFERRING MD :  Sloan Leiter ([facility])   CHIEF COMPLAINT:  Dyspnea, potential for respiratory compromise   BRIEF PATIENT DESCRIPTION:  66 yo female presented with weakness and myalgias for 3 to 4 days.  She had recently completed therapy for Strep throat.  Concern was that she developed Guillain Barre syndrome, and she was started on IVIG 1/30.  She progressed to respiratory failure on 2/01 and required intubation.     SUBJECTIVE:  Remains on full vent support.  VITAL SIGNS: BP (!) 173/93   Pulse 100   Temp 97.9 F (36.6 C) (Oral)   Resp (!) 21   Ht 5\' 2"  (1.575 m)   Wt 74.8 kg (164 lb 14.5 oz)   SpO2 98%   BMI 30.16 kg/m   INTAKE / OUTPUT: I/O last 3 completed shifts: In: 29150.3 [I.V.:26710.3; NG/GT:1540; IV [id number] Out: [id number] [Urine:4655]  General: ill appearing Neuro: weak in arms/legs Eyes: pupils reactive Cardiac: regular, tachycardic Chest: b/l rhonchi Abd: soft, non tender Ext: 1+ edema Skin no rashes  LABS:  BMET  Recent Labs Lab 01/18/17 1247 01/19/17 0345 01/20/17 0430  NA 128* 132* 132*  K 4.3 3.5 3.9  CL 96* 97* 94*  CO2 27 29 28   BUN 11 15 23*  CREATININE 0.74 0.80 0.82  GLUCOSE 153* 120* 135*   Electrolytes  Recent Labs Lab 01/16/17 1656 01/17/17 0433 01/18/17 0358 01/18/17 1247 01/19/17 0345 01/20/17 0430  CALCIUM  --  8.0* 7.9* 8.3* 8.5* 8.7*  MG 2.3 1.8  --  1.7  --   --   PHOS 3.2 1.7* 2.5  --  3.8 4.7*   CBC  Recent Labs Lab 01/17/17 0433 01/19/17 0345 01/20/17 0622  WBC 20.6* 15.6* 14.4*  HGB 10.2* 10.1* 9.5*  HCT 30.2* 29.7* 28.9*  PLT 229 297 349   Coag's No results for input(s): APTT, INR in the last 168 hours.  Sepsis Markers No results for input(s): LATICACIDVEN, PROCALCITON, O2SATVEN in the last 168 hours. ABG  Recent Labs Lab 01/16/17 0838 01/17/17 0432 01/18/17 0420  PHART  7.308* 7.391 7.409  PCO2ART 36.7 38.4 37.8  PO2ART 68.0* 80.9* 105   Liver Enzymes  Recent Labs Lab 01/18/17 0358 01/19/17 0345 01/20/17 0430  AST  --  245*  --   ALT  --  191*  --   ALKPHOS  --  435*  --   BILITOT  --  0.7  --   ALBUMIN 1.6* 1.5* 1.6*   Cardiac Enzymes No results for input(s): TROPONINI, PROBNP in the last 168 hours.  Glucose  Recent Labs Lab 01/19/17 0802 01/19/17 1212 01/19/17 1536 01/19/17 2213 01/19/17 2345 01/20/17 0818  GLUCAP 127* 139* 137* 135* 146* 169*   Imaging Dg Chest Port 1 View  Result Date: 01/20/2017 CLINICAL DATA:  Respiratory failure EXAM: PORTABLE CHEST 1 VIEW COMPARISON:  01/19/2017 FINDINGS: Endotracheal tube tip is at the clavicular heads. Right IJ central line with tip at the SVC level. An orogastric tube reaches stomach. Unchanged streaky basilar opacities, worse on the left where there is also air bronchograms. Heart size is likely normal given technique. No edema, effusion, or pneumothorax. IMPRESSION: 1. Stable positioning of tubes and central line. 2. Unchanged left more than right pneumonia or atelectasis. Electronically Signed   By: Monte Fantasia M.D.   On: 01/20/2017 07:13    SIGNIFICANT EVENTS  IVIG 1/30, 1/31, 2/1,  2/2  STUDIES:  MR brain 1/29>>>negative LP 1/30>>>glucose 55, RBC 6, WBC 3, protein 79  CULTURES: CSF 1/30>>>negative BC x 2 2/1>>> negative Respiratory culture 2/3 >>> rare gpc pairs>>>  ANTIBIOTICS: Diflucan 1/31 >> Unasyn 2/1>>>2/3 vanc 2/3>>> Zosyn 2/3>>>  LINES/TUBES: ETT 2/1>>> Right IJ CVL 2/1>>>   ASSESSMENT / PLAN:  PULMONARY Acute respiratory failure - in setting neuromuscular weakness presumed r/t guillane barre.   ?aspiration  P:   Continue full vent support F/u CXR in AM F/u ABG in AM IVIG per neuro   NEUROLOGIC Guillain barre syndrome  Sedation needs on vent  A-fib with RVR P:   RASS goal: -1 Propofol, fentanyl gtt  PRN versed Neuro following  IVIg per  neuro  CARDIOVASCULAR Hx HTN  P:  Hold home losartan  Levophed for BP support, currently off Cardizem for HR control  RENAL Significant hyponatremia - new onset.  Low serum osm, high urine osm.  Likely r/t IVIG due to hyperproteinemia.    Continues to drop Na even with 3% P:   F/u chem  KVO IVF D/C 3% saline Repeat u/a and urine studies now  Lasix 20 mg IV q8 x2 doses  GASTROINTESTINAL Dysphagia  P:   NPO  PPI  TF   HEMATOLOGIC Leukocytosis  P:  Monitor fever curve  Check pct  SQ Heparin  F/u cbc   INFECTIOUS ? Aspiration  Leukocytosis  P:   Vanc/zosyn day 6 D/C Diflucan after a total of 5 days for thrush (day 3)  ENDOCRINE No active issue  P:   Monitor glucose on chem   FAMILY  - Updates:  Husband updated at length at bedside 2/6  - Inter-disciplinary family meet or Palliative Care meeting due by: 2/8  The patient is critically ill with multiple organ systems failure and requires high complexity decision making for assessment and support, frequent evaluation and titration of therapies, application of advanced monitoring technologies and extensive interpretation of multiple databases.   Critical Care Time devoted to patient care services described in this note is  40  Minutes. This time reflects time of care of this signee Dr Jennet Maduro. This critical care time does not reflect procedure time, or teaching time or supervisory time of PA/NP/Med student/Med Resident etc but could involve care discussion time.  Rush Farmer, M.D. [facility] Pulmonary/Critical Care Medicine. Pager: [phone]. After hours pager: [phone].

## 2017-01-20 NOTE — Progress Notes (Signed)
Subjective: Still complaining of back pain and some pain in her legs. Left sided weakness which then progressed over to the rest of the body. No acute events overnight.  Exam:     Vitals:   01/19/17 0800 01/19/17 0805  BP: (!) 178/101   Pulse: 89   Resp: 20   Temp:  98.1 F (36.7 C)   Gen: In bed, NAD Resp: non-labored breathing, no acute distress Abd: soft, nt  Neuro: MS: More awake and alert in subsequent exam. Following simple commands. Cn: Although she can move her eyes however its limited in all directions. Left lower motor neuron pattern facial droop noted. Motor: Quadriparesis with some preservation of distal muscles. RUE Deltoid 3/5, tricep 2/5, bicep 2/5, We, 3/5, Wf, 2/5. RLE proximal 1/5 and only able to wiggle toes, LUE Deltoid 3/5, bicep 2/5, tricep 1/5, WE 2/5 and WF 2/5.  DTR: Absent reflexes throughout.  Impression: 66 years old female presented with quadroparesis and left facial palsy in the setting of normal neuro axis images. She has not had much improvement with 5 days of IVIG and 2 additional days of IVIG is initiated. Based on today's examination she does clearly have opthalmoparesis in all directions consistent with miller fisher variant. Awaiting GQ1B antibodies. I think due to continuous improvement in her exam PLEX would not offer any additional benefit at this time.   Recommendations: -Follow up GQ1B antibodies -Typically improvement takes a while with IVIG so would not rush to PLEX at this point. -I think EMG/Ketchum study is useful in prognosticating recovery.  Neurology will continue to follow along. Please call with questions.

## 2017-01-20 NOTE — Care Management Note (Signed)
Case Management Note  Patient Details  Name: Kailah Marron MRN: [id number] Date of Birth: 1951/05/05  Subjective/Objective:    Adm w neuromusc weakness, vent at present                Action/Plan: lives w husband   Expected Discharge Date:                  Expected Discharge Plan:  [place]  In-House Referral:  Clinical Social Work  Discharge planning Services     Post Acute Care Choice:    Choice offered to:     DME Arranged:    DME Agency:     HH Arranged:    [place] Agency:     Status of Service:  In process, will continue to follow  If discussed at Long Length of Stay Meetings, dates discussed:    Additional Comments:sw following for poss snf for rehab, cont to follow. Lacretia Leigh, RN 01/20/2017, 10:07 AM

## 2017-01-20 NOTE — Progress Notes (Signed)
Holding wean at this time due to patient HR 130's.

## 2017-01-21 ENCOUNTER — Inpatient Hospital Stay (HOSPITAL_COMMUNITY): Payer: Medicare Other

## 2017-01-21 DIAGNOSIS — R0602 Shortness of breath: Secondary | ICD-10-CM

## 2017-01-21 DIAGNOSIS — E871 Hypo-osmolality and hyponatremia: Secondary | ICD-10-CM | POA: Diagnosis not present

## 2017-01-21 DIAGNOSIS — J690 Pneumonitis due to inhalation of food and vomit: Secondary | ICD-10-CM | POA: Diagnosis not present

## 2017-01-21 DIAGNOSIS — G610 Guillain-Barre syndrome: Secondary | ICD-10-CM | POA: Diagnosis not present

## 2017-01-21 DIAGNOSIS — J9601 Acute respiratory failure with hypoxia: Secondary | ICD-10-CM | POA: Diagnosis not present

## 2017-01-21 LAB — RENAL FUNCTION PANEL
Albumin: 1.7 g/dL — ABNORMAL LOW (ref 3.5–5.0)
Anion gap: 10 (ref 5–15)
BUN: 30 mg/dL — ABNORMAL HIGH (ref 6–20)
CHLORIDE: 94 mmol/L — AB (ref 101–111)
CO2: 30 mmol/L (ref 22–32)
Calcium: 9.1 mg/dL (ref 8.9–10.3)
Creatinine, Ser: 0.94 mg/dL (ref 0.44–1.00)
Glucose, Bld: 143 mg/dL — ABNORMAL HIGH (ref 65–99)
POTASSIUM: 4.2 mmol/L (ref 3.5–5.1)
Phosphorus: 4.7 mg/dL — ABNORMAL HIGH (ref 2.5–4.6)
Sodium: 134 mmol/L — ABNORMAL LOW (ref 135–145)

## 2017-01-21 LAB — BLOOD GAS, ARTERIAL
Acid-Base Excess: 6.7 mmol/L — ABNORMAL HIGH (ref 0.0–2.0)
BICARBONATE: 30.4 mmol/L — AB (ref 20.0–28.0)
Drawn by: 31101
FIO2: 40
O2 SAT: 96.5 %
PEEP: 5 cmH2O
PH ART: 7.476 — AB (ref 7.350–7.450)
Patient temperature: 98.6
RATE: 20 resp/min
VT: 450 mL
pCO2 arterial: 41.8 mmHg (ref 32.0–48.0)
pO2, Arterial: 83.2 mmHg (ref 83.0–108.0)

## 2017-01-21 LAB — CBC
HCT: 31.1 % — ABNORMAL LOW (ref 36.0–46.0)
HEMOGLOBIN: 10.2 g/dL — AB (ref 12.0–15.0)
MCH: 26.2 pg (ref 26.0–34.0)
MCHC: 32.8 g/dL (ref 30.0–36.0)
MCV: 79.7 fL (ref 78.0–100.0)
PLATELETS: 365 10*3/uL (ref 150–400)
RBC: 3.9 MIL/uL (ref 3.87–5.11)
RDW: 15.3 % (ref 11.5–15.5)
WBC: 17.5 10*3/uL — ABNORMAL HIGH (ref 4.0–10.5)

## 2017-01-21 LAB — GLUCOSE, CAPILLARY
GLUCOSE-CAPILLARY: 115 mg/dL — AB (ref 65–99)
GLUCOSE-CAPILLARY: 132 mg/dL — AB (ref 65–99)
GLUCOSE-CAPILLARY: 138 mg/dL — AB (ref 65–99)
GLUCOSE-CAPILLARY: 152 mg/dL — AB (ref 65–99)
GLUCOSE-CAPILLARY: 192 mg/dL — AB (ref 65–99)
Glucose-Capillary: 134 mg/dL — ABNORMAL HIGH (ref 65–99)
Glucose-Capillary: 162 mg/dL — ABNORMAL HIGH (ref 65–99)
Glucose-Capillary: 166 mg/dL — ABNORMAL HIGH (ref 65–99)

## 2017-01-21 LAB — TRIGLYCERIDES: Triglycerides: 145 mg/dL (ref <150)

## 2017-01-21 LAB — MAGNESIUM: Magnesium: 2.4 mg/dL (ref 1.7–2.4)

## 2017-01-21 MED ORDER — PROPOFOL 1000 MG/100ML IV EMUL
INTRAVENOUS | Status: AC
  Filled 2017-01-21: qty 100

## 2017-01-21 MED ORDER — PROPOFOL 1000 MG/100ML IV EMUL
5.0000 ug/kg/min | INTRAVENOUS | Status: DC
  Administered 2017-01-21: 30 ug/kg/min via INTRAVENOUS

## 2017-01-21 MED ORDER — PROPOFOL 1000 MG/100ML IV EMUL
5.0000 ug/kg/min | INTRAVENOUS | Status: DC
  Administered 2017-01-21 – 2017-01-22 (×4): 30 ug/kg/min via INTRAVENOUS
  Administered 2017-01-23: 29.093 ug/kg/min via INTRAVENOUS
  Administered 2017-01-23: 35 ug/kg/min via INTRAVENOUS
  Filled 2017-01-21 (×5): qty 100

## 2017-01-21 MED ORDER — MIDAZOLAM HCL 2 MG/2ML IJ SOLN
1.0000 mg | INTRAMUSCULAR | Status: DC | PRN
  Administered 2017-01-23: 2 mg via INTRAVENOUS
  Administered 2017-01-23 – 2017-01-24 (×2): 4 mg via INTRAVENOUS
  Administered 2017-01-24: 2 mg via INTRAVENOUS
  Administered 2017-01-24 – 2017-01-25 (×3): 4 mg via INTRAVENOUS
  Administered 2017-01-25: 2 mg via INTRAVENOUS
  Administered 2017-01-25 (×2): 4 mg via INTRAVENOUS
  Administered 2017-01-25: 2 mg via INTRAVENOUS
  Administered 2017-01-27: 4 mg via INTRAVENOUS
  Administered 2017-01-27 (×4): 2 mg via INTRAVENOUS
  Administered 2017-01-28: 4 mg via INTRAVENOUS
  Administered 2017-01-28 (×2): 2 mg via INTRAVENOUS
  Filled 2017-01-21 (×2): qty 4
  Filled 2017-01-21: qty 2
  Filled 2017-01-21: qty 4
  Filled 2017-01-21 (×8): qty 2
  Filled 2017-01-21 (×2): qty 4
  Filled 2017-01-21: qty 2
  Filled 2017-01-21 (×4): qty 4

## 2017-01-21 NOTE — Progress Notes (Signed)
    Diprivan renewal at RN request - order expired  Dr. Brand Males, M.D., [facility].C.P Pulmonary and Critical Care Medicine Staff Physician [place] [facility] and Critical [facility] Pager: [phone], If no answer or between  15:00h - 7:00h: call [phone]  01/21/2017 5:35 PM  '

## 2017-01-21 NOTE — Progress Notes (Signed)
Name: Joanne Morrison MRN: [id number] DOB: [birth year]    ADMISSION DATE:  01/12/2017 CONSULTATION DATE:  01/15/2017  REFERRING MD :  Sloan Leiter ([facility])   CHIEF COMPLAINT:  Dyspnea, potential for respiratory compromise   BRIEF PATIENT DESCRIPTION:  66 yo female presented with weakness and myalgias for 3 to 4 days.  She had recently completed therapy for Strep throat.  Concern was that she developed Guillain Barre syndrome, and she was started on IVIG 1/30.  She progressed to respiratory failure on 2/01 and required intubation.     SUBJECTIVE:  Tolerated PS some this AM but tired out.  VITAL SIGNS: BP (!) 178/103 (BP Location: Right Arm)   Pulse (!) 120   Temp 97.9 F (36.6 C) (Oral)   Resp (!) 21   Ht 5\' 2"  (1.575 m)   Wt 73.9 kg (163 lb)   SpO2 96%   BMI 29.81 kg/m   INTAKE / OUTPUT: I/O last 3 completed shifts: In: 3653.6 [I.V.:1133.6; NG/GT:1570; IV Piggyback:950] Out: ND:975699; Emesis/NG output:250]  General: acutely ill appearing female Neuro: weak in arms/legs but strength is improving compared to yesterday Eyes: pupils reactive, eyes open, unable to close with lacrilube in them Cardiac: RRR, Nl S1/S2, -M/R/G. Chest: Coarse BS diffusely Abd: Soft, NT, ND and +BS Ext: 1+ edema and -tenderness Skin no rashes  LABS:  BMET  Recent Labs Lab 01/19/17 0345 01/20/17 0430 01/21/17 0308  NA 132* 132* 134*  K 3.5 3.9 4.2  CL 97* 94* 94*  CO2 29 28 30   BUN 15 23* 30*  CREATININE 0.80 0.82 0.94  GLUCOSE 120* 135* 143*   Electrolytes  Recent Labs Lab 01/17/17 0433  01/18/17 1247 01/19/17 0345 01/20/17 0430 01/21/17 0308  CALCIUM 8.0*  < > 8.3* 8.5* 8.7* 9.1  MG 1.8  --  1.7  --   --  2.4  PHOS 1.7*  < >  --  3.8 4.7* 4.7*  < > = values in this interval not displayed. CBC  Recent Labs Lab 01/19/17 0345 01/20/17 0622 01/21/17 0308  WBC 15.6* 14.4* 17.5*  HGB 10.1* 9.5* 10.2*  HCT 29.7* 28.9* 31.1*  PLT 297 349 365   Coag's No results for  input(s): APTT, INR in the last 168 hours.  Sepsis Markers No results for input(s): LATICACIDVEN, PROCALCITON, O2SATVEN in the last 168 hours. ABG  Recent Labs Lab 01/17/17 0432 01/18/17 0420 01/21/17 0242  PHART 7.391 7.409 7.476*  PCO2ART 38.4 37.8 41.8  PO2ART 80.9* 105 83.2   Liver Enzymes  Recent Labs Lab 01/19/17 0345 01/20/17 0430 01/21/17 0308  AST 245*  --   --   ALT 191*  --   --   ALKPHOS 435*  --   --   BILITOT 0.7  --   --   ALBUMIN 1.5* 1.6* 1.7*   Cardiac Enzymes No results for input(s): TROPONINI, PROBNP in the last 168 hours.  Glucose  Recent Labs Lab 01/20/17 1157 01/20/17 1602 01/20/17 1944 01/21/17 0005 01/21/17 0352 01/21/17 0816  GLUCAP 180* 136* 116* 166* 115* 162*   Imaging Dg Chest Port 1 View  Result Date: 01/21/2017 CLINICAL DATA:  Hypoxia EXAM: PORTABLE CHEST 1 VIEW COMPARISON:  January 20, 2017 FINDINGS: Endotracheal tube tip is 2.8 cm above carina. Nasogastric tube side port below the diaphragm. Central catheter tip is at the cavoatrial junction. No pneumothorax. There is patchy airspace opacity in both lower lung zone with small left pleural effusion. Lungs elsewhere are clear. Heart is mildly enlarged  with pulmonary vascularity within normal limits. No adenopathy. No bone lesions. IMPRESSION: Tube and catheter positions as described without pneumothorax. Patchy airspace consolidation lung bases, stable. Small left pleural effusion. No new opacity. Stable cardiac prominence. Electronically Signed   By: Lowella Grip III M.D.   On: 01/21/2017 07:05    SIGNIFICANT EVENTS  IVIG 1/30, 1/31, 2/1, 2/2  STUDIES:  MR brain 1/29>>>negative LP 1/30>>>glucose 55, RBC 6, WBC 3, protein 79  CULTURES: CSF 1/30>>>negative BC x 2 2/1>>> negative Respiratory culture 2/3 >>> rare gpc pairs>>>normal flora  ANTIBIOTICS: Diflucan 1/31 >> Unasyn 2/1>>>2/3 vanc 2/3>>> Zosyn 2/3>>>  LINES/TUBES: ETT 2/1>>> Right IJ CVL  2/1>>>  ASSESSMENT / PLAN:  PULMONARY Acute respiratory failure - in setting neuromuscular weakness presumed r/t guillane barre.   ?aspiration  P:   Continue full vent support with PSV as able F/u CXR in AM F/u ABG in AM IVIG per neuro - now complete Plan trach on Friday at 10 AM.  NEUROLOGIC Guillain barre syndrome  Sedation needs on vent  A-fib with RVR P:   RASS goal: -1 Fentanyl gtt  D/C propofol PRN versed Neuro following  IVIg per neuro - complete, ?EMG study for prognostication  CARDIOVASCULAR Hx HTN  P:  Hold home losartan  Levophed for BP support, currently off Cardizem for HR control  RENAL Significant hyponatremia - new onset.  Low serum osm, high urine osm.  Likely r/t IVIG due to hyperproteinemia.    Continues to drop Na even with 3% P:   F/u chem  KVO IVF D/Ced 3% saline Hold further lasix for now Replace electrolytes as indicated  GASTROINTESTINAL Dysphagia  P:   NPO  PPI  TF  Will need PEG tube likely with IR  HEMATOLOGIC Leukocytosis  P:  Monitor fever curve  SQ Heparin  F/u cbc   INFECTIOUS ? Aspiration  Leukocytosis  P:   Vanc/zosyn day 7 D/C Diflucan after a total of 5 days for thrush (day 4)  ENDOCRINE No active issue  P:   Monitor glucose on chem   FAMILY  - Updates:  Husband updated at length at bedside 2/7, plan trach on Friday, will place consult for G-tube via IR.  - Inter-disciplinary family meet or Palliative Care meeting due by: 2/8  The patient is critically ill with multiple organ systems failure and requires high complexity decision making for assessment and support, frequent evaluation and titration of therapies, application of advanced monitoring technologies and extensive interpretation of multiple databases.   Critical Care Time devoted to patient care services described in this note is  37  Minutes. This time reflects time of care of this signee Dr Jennet Maduro. This critical care time does not  reflect procedure time, or teaching time or supervisory time of PA/NP/Med student/Med Resident etc but could involve care discussion time.  Rush Farmer, M.D. [facility] Pulmonary/Critical Care Medicine. Pager: [phone]. After hours pager: [phone].

## 2017-01-21 NOTE — Progress Notes (Signed)
Dr Nelda Marseille req that cm check on ltac benefits and may need snf if not ltac benefits. Will have both ltac's ck for benefits. sw ref placed also.

## 2017-01-21 NOTE — Progress Notes (Signed)
Physical Therapy Treatment Patient Details Name: Baran Meincke MRN: [id number] DOB: 01-23-1951 Today's Date: 01/21/2017    History of Present Illness Patient is a 66 y.o. female with medical history significant of chronic low back pain, anxiety, depression, hypertension-recently had sorethroat (+strep) presented to the ED on 1/29 with lower ext weakness,tingling numbess that started in the lower extremities and subsequently started involving her upper extremities. Unable to elicit DTR in the lower ext. MRI of the entire spine negative. Concern for GBS-started on empiric IVIG by neurology.Developed respiratory failure and was intubated 2/1 and trasnferred to ICU.    PT Comments    Pt remains intubated and sedated. Performed PROM to LE's. Some hamstring tightness noted. Pt in PRAFO's to bilat feet and ankles. Pt to have trach placed soon. Will plan to resume OOB with lift when more stable.  Follow Up Recommendations  LTACH     Equipment Recommendations  Other (comment) (to be determined)    Recommendations for Other Services       Precautions / Restrictions Precautions Precautions: Fall Precaution Comments: vent Restrictions Weight Bearing Restrictions: No    Mobility  Bed Mobility                  Transfers                    Ambulation/Gait                 Stairs            Wheelchair Mobility    Modified Rankin (Stroke Patients Only)       Balance                                    Cognition Arousal/Alertness: Lethargic;Suspect due to medications (sedated)                          Exercises General Exercises - Lower Extremity Straight Leg Raises: PROM;Both;10 reps;Supine (to ~40 degrees) Low Level/ICU Exercises Ankle Circles/Pumps: PROM;Both;10 reps;Supine Hip ABduction/ADduction: PROM;Both;10 reps;Supine Heel Slides: PROM;Both;10 reps;Supine Other Exercises Other Exercises: Passive hip external and  intrernal rotation    General Comments        Pertinent Vitals/Pain Pain Assessment: Faces Faces Pain Scale: Hurts a little bit Pain Location: hamstrings? with stretch Pain Descriptors / Indicators: Grimacing Pain Intervention(s): Limited activity within patient's tolerance;Monitored during session    Home Living                      Prior Function            PT Goals (current goals can now be found in the care plan section) Progress towards PT goals: Not progressing toward goals - comment    Frequency    Min 3X/week      PT Plan Discharge plan needs to be updated    Co-evaluation             End of Session Equipment Utilized During Treatment: Oxygen (vent) Activity Tolerance: Patient limited by lethargy;Treatment limited secondary to medical complications (Comment) Patient left: in bed;with call bell/phone within reach     Time: 1417-1433 PT Time Calculation (min) (ACUTE ONLY): 16 min  Charges:  $Therapeutic Activity: 8-22 mins                    G Codes:  Shary Decamp [place] 01/21/2017, 3:07 PM [employer] PT [phone]

## 2017-01-21 NOTE — Progress Notes (Signed)
Noted plans as outlined. I will sign off at this time. Please notify me if pt extubated and able to participate in therapy. [id number]

## 2017-01-21 NOTE — Progress Notes (Signed)
Subjective: No acute events overnight.  Exam:     Vitals:   01/19/17 0800 01/19/17 0805  BP: (!) 178/101   Pulse: 89   Resp: 20   Temp:  98.1 F (36.7 C)   Gen: In bed, NAD Resp: non-labored breathing, no acute distress Abd: soft, nt  Neuro: MS: More awake and alert in subsequent exam. Following simple commands. Cn: Today she was not able to move her eyes very much. Left lower motor neuron pattern facial droop noted. Motor: Quadriparesis with some preservation of distal muscles. RUE Deltoid 3/5, tricep 2/5, bicep 2/5, We, 3/5, Wf, 2/5. RLE proximal 1/5 and only able to wiggle toes, LUE Deltoid 3/5, bicep 2/5, tricep 1/5, WE 2/5 and WF 2/5.  DTR: Absent reflexes throughout.  Impression: 66 years old female presented with quadroparesis and left facial palsy in the setting of normal neuro axis images. She has improved somewhat with 7 days of IVIG. Awaiting GQ1B antibodies. She has probably reached nadir however worsening of eye movements today perhaps cooperation related. If still appears to be worst then would recommend PLEX.  Recommendations: -Follow up GQ1B antibodies -If any further worsening will recommend PLEX for 5 sessions.  Neurology will continue to follow along. Please call with questions.

## 2017-01-21 NOTE — Progress Notes (Signed)
NIF -10, VC 751mls

## 2017-01-22 ENCOUNTER — Inpatient Hospital Stay (HOSPITAL_COMMUNITY): Payer: Medicare Other

## 2017-01-22 DIAGNOSIS — J9600 Acute respiratory failure, unspecified whether with hypoxia or hypercapnia: Secondary | ICD-10-CM | POA: Diagnosis not present

## 2017-01-22 DIAGNOSIS — E870 Hyperosmolality and hypernatremia: Secondary | ICD-10-CM | POA: Diagnosis not present

## 2017-01-22 DIAGNOSIS — I480 Paroxysmal atrial fibrillation: Secondary | ICD-10-CM

## 2017-01-22 DIAGNOSIS — R531 Weakness: Secondary | ICD-10-CM | POA: Diagnosis not present

## 2017-01-22 DIAGNOSIS — G8250 Quadriplegia, unspecified: Secondary | ICD-10-CM | POA: Diagnosis not present

## 2017-01-22 DIAGNOSIS — J690 Pneumonitis due to inhalation of food and vomit: Secondary | ICD-10-CM | POA: Diagnosis not present

## 2017-01-22 DIAGNOSIS — E876 Hypokalemia: Secondary | ICD-10-CM | POA: Diagnosis not present

## 2017-01-22 DIAGNOSIS — I959 Hypotension, unspecified: Secondary | ICD-10-CM | POA: Diagnosis not present

## 2017-01-22 DIAGNOSIS — I4892 Unspecified atrial flutter: Secondary | ICD-10-CM | POA: Diagnosis not present

## 2017-01-22 DIAGNOSIS — G610 Guillain-Barre syndrome: Secondary | ICD-10-CM | POA: Diagnosis not present

## 2017-01-22 DIAGNOSIS — B999 Unspecified infectious disease: Secondary | ICD-10-CM | POA: Diagnosis not present

## 2017-01-22 DIAGNOSIS — B370 Candidal stomatitis: Secondary | ICD-10-CM | POA: Diagnosis not present

## 2017-01-22 DIAGNOSIS — E871 Hypo-osmolality and hyponatremia: Secondary | ICD-10-CM | POA: Diagnosis not present

## 2017-01-22 DIAGNOSIS — J9601 Acute respiratory failure with hypoxia: Secondary | ICD-10-CM | POA: Diagnosis not present

## 2017-01-22 LAB — CBC
HCT: 29.5 % — ABNORMAL LOW (ref 36.0–46.0)
Hemoglobin: 9.5 g/dL — ABNORMAL LOW (ref 12.0–15.0)
MCH: 26.2 pg (ref 26.0–34.0)
MCHC: 32.2 g/dL (ref 30.0–36.0)
MCV: 81.5 fL (ref 78.0–100.0)
PLATELETS: 357 10*3/uL (ref 150–400)
RBC: 3.62 MIL/uL — AB (ref 3.87–5.11)
RDW: 15.6 % — AB (ref 11.5–15.5)
WBC: 15.4 10*3/uL — AB (ref 4.0–10.5)

## 2017-01-22 LAB — GLUCOSE, CAPILLARY
GLUCOSE-CAPILLARY: 154 mg/dL — AB (ref 65–99)
GLUCOSE-CAPILLARY: 166 mg/dL — AB (ref 65–99)
GLUCOSE-CAPILLARY: 115 mg/dL — AB (ref 65–99)
GLUCOSE-CAPILLARY: 135 mg/dL — AB (ref 65–99)
Glucose-Capillary: 119 mg/dL — ABNORMAL HIGH (ref 65–99)

## 2017-01-22 LAB — RENAL FUNCTION PANEL
ANION GAP: 9 (ref 5–15)
Albumin: 1.7 g/dL — ABNORMAL LOW (ref 3.5–5.0)
BUN: 30 mg/dL — ABNORMAL HIGH (ref 6–20)
CALCIUM: 8.9 mg/dL (ref 8.9–10.3)
CHLORIDE: 95 mmol/L — AB (ref 101–111)
CO2: 30 mmol/L (ref 22–32)
Creatinine, Ser: 0.8 mg/dL (ref 0.44–1.00)
GFR calc Af Amer: >60 mL/min (ref >60)
GFR calc non Af Amer: >60 mL/min (ref >60)
GLUCOSE: 124 mg/dL — AB (ref 65–99)
POTASSIUM: 3.9 mmol/L (ref 3.5–5.1)
Phosphorus: 4.2 mg/dL (ref 2.5–4.6)
Sodium: 134 mmol/L — ABNORMAL LOW (ref 135–145)

## 2017-01-22 LAB — BLOOD GAS, ARTERIAL
Acid-Base Excess: 7 mmol/L — ABNORMAL HIGH (ref 0.0–2.0)
BICARBONATE: 30.8 mmol/L — AB (ref 20.0–28.0)
DRAWN BY: 41977
FIO2: 40
O2 SAT: 96.3 %
PATIENT TEMPERATURE: 98.6
PCO2 ART: 42.5 mmHg (ref 32.0–48.0)
PEEP: 5 cmH2O
PH ART: 7.473 — AB (ref 7.350–7.450)
PO2 ART: 82.2 mmHg — AB (ref 83.0–108.0)
RATE: 20 resp/min

## 2017-01-22 LAB — MISC LABCORP TEST (SEND OUT): Labcorp test code: 808602

## 2017-01-22 LAB — MAGNESIUM: MAGNESIUM: 2.1 mg/dL (ref 1.7–2.4)

## 2017-01-22 MED ORDER — MIDAZOLAM HCL 2 MG/2ML IJ SOLN: 4.0000 mg | Freq: Once | INTRAMUSCULAR | Status: DC

## 2017-01-22 MED ORDER — VECURONIUM BROMIDE 10 MG IV SOLR
10.0000 mg | Freq: Once | INTRAVENOUS | Status: AC
  Administered 2017-01-23: 10 mg via INTRAVENOUS

## 2017-01-22 MED ORDER — FENTANYL CITRATE (PF) 100 MCG/2ML IJ SOLN
200.0000 ug | Freq: Once | INTRAMUSCULAR | Status: AC
  Administered 2017-01-23: 200 ug via INTRAVENOUS

## 2017-01-22 MED ORDER — PROPOFOL 500 MG/50ML IV EMUL
5.0000 ug/kg/min | Freq: Once | INTRAVENOUS | Status: DC
  Filled 2017-01-22: qty 50

## 2017-01-22 MED ORDER — ETOMIDATE 2 MG/ML IV SOLN
40.0000 mg | Freq: Once | INTRAVENOUS | Status: AC
Start: 2017-01-22 — End: 2017-01-23
  Administered 2017-01-23: 20 mg via INTRAVENOUS

## 2017-01-22 NOTE — Progress Notes (Signed)
Subjective: Interval History:  No acute events overnight. Her eyes appear to be slightly more erythematous and edematous. Pt reports she is improving by nodding her head.  Objective: Vital signs in last 24 hours: Temp:  [97.6 F (36.4 C)-99.3 F (37.4 C)] 97.8 F (36.6 C) (02/08 0753) Pulse Rate:  [87-127] 127 (02/08 1000) Resp:  [16-23] 23 (02/08 1000) BP: (109-209)/(67-118) 209/118 (02/08 1000) SpO2:  [95 %-100 %] 98 % (02/08 1000) FiO2 (%):  [40 %] 40 % (02/08 0834) Weight:  [73.1 kg (161 lb 2.5 oz)] 73.1 kg (161 lb 2.5 oz) (02/08 0300)  Intake/Output from previous day: 02/07 0701 - 02/08 0700 In: 2577.8 [I.V.:807.8; NG/GT:1070; IV Piggyback:700] Out: 2695 [Urine:2695] Intake/Output this shift: Total I/O In: 446.2 [I.V.:46.2; NG/GT:200; IV Piggyback:200] Out: 175 [Urine:175] Nutritional status: Diet NPO time specified Diet NPO time specified  Neuro: MS: More awake and alert. Following simple commands. Cn: Moved her eyes today better however limited somewhat due to edema. Left lower motor neuron pattern facial droop noted. Motor: Quadriparesis with some preservation of distal muscles. RUE Deltoid 3/5, tricep 2/5, bicep 2/5, We, 3/5, Wf, 2/5. RLE proximal 1/5 and only able to wiggle toes, LUE Deltoid 3/5, bicep 2/5, tricep 1/5, WE 2/5 and WF 2/5. LLE: Proximal 1/5 and able to plantarflexion slightly but difficulty with dorsiflexion. Able to move her neck much better today. DTR: Absent reflexes throughout.  Lab Results:  Recent Labs  01/21/17 0308 01/22/17 0257  WBC 17.5* 15.4*  HGB 10.2* 9.5*  HCT 31.1* 29.5*  PLT 365 357  NA 134* 134*  K 4.2 3.9  CL 94* 95*  CO2 30 30  GLUCOSE 143* 124*  BUN 30* 30*  CREATININE 0.94 0.80  CALCIUM 9.1 8.9   Lipid Panel  Recent Labs  01/21/17 1452  TRIG 145    Studies/Results: Ct Abdomen Wo Contrast  Result Date: 01/21/2017 CLINICAL DATA:  Dysphagia EXAM: CT ABDOMEN WITHOUT CONTRAST TECHNIQUE: Multidetector CT imaging of  the abdomen was performed following the standard protocol without IV contrast. COMPARISON:  None. FINDINGS: Lower chest: There is dense bibasilar atelectasis with air bronchograms suggesting pneumonia. Hepatobiliary: No focal hepatic lesions on noncontrast exam. Gallbladder normal. Pancreas: Normal pancreatic parenchymal intensity. No ductal dilatation or inflammation. The stomach in typical orientation first Spleen: Normal spleen. Adrenals/urinary tract: Adrenal glands and kidneys are normal. Stomach/Bowel: NG tube extends into the gastric body. The stomach is in typical orientation. No hiatal hernia. The duodenum and limited view of the small bowel colon are unremarkable. Vascular/Lymphatic: Abdominal aortic normal caliber. No retroperitoneal periportal lymphadenopathy. Musculoskeletal: No aggressive osseous lesion IMPRESSION: 1. Dense bibasilar atelectasis with air bronchograms is concerning for bibasilar pneumonia. 2. Normal gastric anatomy with stomach in typical orientation. NG tube in stomach. These results will be called to the ordering clinician or representative by the Radiologist Assistant, and communication documented in the PACS or zVision Dashboard. Electronically Signed   By: Suzy Bouchard M.D.   On: 01/21/2017 17:06   Dg Chest Port 1 View  Result Date: 01/22/2017 CLINICAL DATA:  Dysphagia and hypoxia EXAM: PORTABLE CHEST 1 VIEW COMPARISON:  01/21/2017 FINDINGS: Endotracheal tube, right-sided jugular central line and nasogastric catheter are again seen and stable. Cardiac shadow is stable. Bibasilar infiltrative changes are noted worse on the left than the right. Stable small left pleural effusion is noted. No new focal abnormality is seen. IMPRESSION: No significant change from the previous day. Electronically Signed   By: Inez Catalina M.D.   On: 01/22/2017 07:56  Dg Chest Port 1 View  Result Date: 01/21/2017 CLINICAL DATA:  Hypoxia EXAM: PORTABLE CHEST 1 VIEW COMPARISON:  January 20, 2017  FINDINGS: Endotracheal tube tip is 2.8 cm above carina. Nasogastric tube side port below the diaphragm. Central catheter tip is at the cavoatrial junction. No pneumothorax. There is patchy airspace opacity in both lower lung zone with small left pleural effusion. Lungs elsewhere are clear. Heart is mildly enlarged with pulmonary vascularity within normal limits. No adenopathy. No bone lesions. IMPRESSION: Tube and catheter positions as described without pneumothorax. Patchy airspace consolidation lung bases, stable. Small left pleural effusion. No new opacity. Stable cardiac prominence. Electronically Signed   By: Lowella Grip III M.D.   On: 01/21/2017 07:05    Medications:  Scheduled: . chlorhexidine gluconate (MEDLINE KIT)  15 mL Mouth Rinse BID  . Chlorhexidine Gluconate Cloth  6 each Topical Daily  . etomidate  40 mg Intravenous Once  . feeding supplement (PRO-STAT SUGAR FREE 64)  30 mL Per Tube BID  . feeding supplement (VITAL AF 1.2 CAL)  1,000 mL Per Tube Q24H  . fentaNYL (SUBLIMAZE) injection  200 mcg Intravenous Once  . heparin subcutaneous  5,000 Units Subcutaneous Q8H  . insulin aspart  0-15 Units Subcutaneous Q4H  . lidocaine  1 patch Transdermal Q24H  . mouth rinse  15 mL Mouth Rinse 10 times per day  . midazolam  4 mg Intravenous Once  . pantoprazole sodium  40 mg Per Tube Q1200  . piperacillin-tazobactam (ZOSYN)  IV  3.375 g Intravenous Q8H  . propofol  5-80 mcg/kg/min Intravenous Once  . sodium chloride flush  10-40 mL Intracatheter Q12H  . sodium chloride flush  3 mL Intravenous Q12H  . vancomycin  1,000 mg Intravenous Q12H  . vecuronium  10 mg Intravenous Once   Continuous: . diltiazem (CARDIZEM) infusion Stopped (01/20/17 1300)  . fentaNYL infusion INTRAVENOUS 200 mcg/hr (01/22/17 1015)  . norepinephrine (LEVOPHED) Adult infusion Stopped (01/17/17 0500)  . propofol (DIPRIVAN) infusion 30 mcg/kg/min (01/22/17 1015)   PRN:[DISCONTINUED] acetaminophen **OR**  acetaminophen, albuterol, artificial tears, hydrALAZINE, labetalol, midazolam, naphazoline-glycerin, ondansetron **OR** ondansetron (ZOFRAN) IV, sodium chloride flush  Assessment/Plan: 66 years old female presented with quadriparesis. She has improved somewhat with 7 days of IVIG. She has now shown improvement which is expected and definitely no worsening. At this time I think her examination has started to improve therefore no need for PLEX. If symptoms worsen again may need to consider IVIG again rather than PLEX.  Recommendations: -Follow up GQ1B antibodies -Do recommend taping her eyes when she falls asleep. -Trach placement on Friday then possible discharge to [facility].   LOS: 9 days   Rochele Raring

## 2017-01-22 NOTE — Progress Notes (Signed)
Nutrition Follow-up  INTERVENTION:   Vital AF 1.2 @ 40 ml/hr (960 ml/day) 30 ml Prostat BID Provides: 1352 kcal, 102 grams protein, and 778 ml H2O.  TF regimen and propofol at current rate providing 1645 total kcal/day (112 % of kcal needs)  NUTRITION DIAGNOSIS:   Inadequate oral intake related to inability to eat as evidenced by NPO status. Ongoing.   GOAL:   Patient will meet greater than or equal to 90% of their needs Met.   MONITOR:   TF tolerance, Vent status, I & O's, Labs  ASSESSMENT:   66yo female with hx chronic cystitis, depression/anxiety who presented 1/29 with L>R BLE weakness, LUE weakness and worsening myalgias x 3-4 days.  She was recently dx with strep pharyngitis and completed a course of zithromax.   She was seen in consultation by neurology and felt her s/s were most c/w guillain barre. She was started on IVIG 1/30.  Plan for trach 2/8 then transfer to [facility]. Plan for g-tube 2/12 with IR.   Patient is currently intubated on ventilator support MV: 8.9 L/min Temp (24hrs), Avg:98.7 F (37.1 C), Min:97.7 F (36.5 C), Max:99.3 F (37.4 C)  Propofol: 11.1 ml/hr provides 293 kcal, plan to stop tomorrow am after trach placment   Diet Order:  Diet NPO time specified Diet NPO time specified  Skin:  Wound (see comment) (stage I sacrum)  Last BM:  1/30  Height:   Ht Readings from Last 1 Encounters:  01/15/17 5' 2"  (1.575 m)    Weight:   Wt Readings from Last 1 Encounters:  01/22/17 161 lb 2.5 oz (73.1 kg)  Admit weight 69.4 kg  Ideal Body Weight:  50 kg  BMI:  Body mass index is 29.48 kg/m.  Estimated Nutritional Needs:   Kcal:  1436  Protein:  100-110 grams  Fluid:  > 1.5 L/day  EDUCATION NEEDS:   No education needs identified at this time  [place], [place], [place] Pager [phone] After Hours Pager

## 2017-01-22 NOTE — Progress Notes (Signed)
Name: Joanne Morrison MRN: [id number] DOB: [birth year]    ADMISSION DATE:  01/12/2017 CONSULTATION DATE:  01/15/2017  REFERRING MD :  Sloan Leiter ([facility])   CHIEF COMPLAINT:  Dyspnea, potential for respiratory compromise   BRIEF PATIENT DESCRIPTION:  66 yo female presented with weakness and myalgias for 3 to 4 days.  She had recently completed therapy for Strep throat.  Concern was that she developed Guillain Barre syndrome, and she was started on IVIG 1/30.  She progressed to respiratory failure on 2/01 and required intubation.     SUBJECTIVE:  Tolerated PS some this AM but tired out.  VITAL SIGNS: BP (!) 194/85   Pulse (!) 119   Temp 97.8 F (36.6 C) (Oral)   Resp 19   Ht 5\' 2"  (1.575 m)   Wt 73.1 kg (161 lb 2.5 oz)   SpO2 98%   BMI 29.48 kg/m   INTAKE / OUTPUT: I/O last 3 completed shifts: In: 3942.7 [I.V.:1182.7; NG/GT:1610; IV [id number] Out: [id number]; Emesis/NG output:250]  General: acutely ill appearing female Neuro: Diffusely weak, eyes open and unable to close Eyes: pupils reactive, eyes open, unable to close with lacrilube in them Cardiac: RRR, Nl S1/S2, -M/R/G. Chest: Diffuse crackles noted Abd: Soft, NT, ND and +BS Ext: 1+ edema and -tenderness Skin no rashes  LABS:  BMET  Recent Labs Lab 01/20/17 0430 01/21/17 0308 01/22/17 0257  NA 132* 134* 134*  K 3.9 4.2 3.9  CL 94* 94* 95*  CO2 28 30 30   BUN 23* 30* 30*  CREATININE 0.82 0.94 0.80  GLUCOSE 135* 143* 124*   Electrolytes  Recent Labs Lab 01/18/17 1247  01/20/17 0430 01/21/17 0308 01/22/17 0257  CALCIUM 8.3*  < > 8.7* 9.1 8.9  MG 1.7  --   --  2.4 2.1  PHOS  --   < > 4.7* 4.7* 4.2  < > = values in this interval not displayed. CBC  Recent Labs Lab 01/20/17 0622 01/21/17 0308 01/22/17 0257  WBC 14.4* 17.5* 15.4*  HGB 9.5* 10.2* 9.5*  HCT 28.9* 31.1* 29.5*  PLT 349 365 357   Coag's No results for input(s): APTT, INR in the last 168 hours.  Sepsis Markers No  results for input(s): LATICACIDVEN, PROCALCITON, O2SATVEN in the last 168 hours. ABG  Recent Labs Lab 01/18/17 0420 01/21/17 0242 01/22/17 0320  PHART 7.409 7.476* 7.473*  PCO2ART 37.8 41.8 42.5  PO2ART 105 83.2 82.2*   Liver Enzymes  Recent Labs Lab 01/19/17 0345 01/20/17 0430 01/21/17 0308 01/22/17 0257  AST 245*  --   --   --   ALT 191*  --   --   --   ALKPHOS 435*  --   --   --   BILITOT 0.7  --   --   --   ALBUMIN 1.5* 1.6* 1.7* 1.7*   Cardiac Enzymes No results for input(s): TROPONINI, PROBNP in the last 168 hours.  Glucose  Recent Labs Lab 01/21/17 1118 01/21/17 1616 01/21/17 2013 01/21/17 2358 01/22/17 0333 01/22/17 0751  GLUCAP 192* 132* 134* 152* 119* 154*   Imaging Ct Abdomen Wo Contrast  Result Date: 01/21/2017 CLINICAL DATA:  Dysphagia EXAM: CT ABDOMEN WITHOUT CONTRAST TECHNIQUE: Multidetector CT imaging of the abdomen was performed following the standard protocol without IV contrast. COMPARISON:  None. FINDINGS: Lower chest: There is dense bibasilar atelectasis with air bronchograms suggesting pneumonia. Hepatobiliary: No focal hepatic lesions on noncontrast exam. Gallbladder normal. Pancreas: Normal pancreatic parenchymal intensity. No ductal dilatation or inflammation.  The stomach in typical orientation first Spleen: Normal spleen. Adrenals/urinary tract: Adrenal glands and kidneys are normal. Stomach/Bowel: NG tube extends into the gastric body. The stomach is in typical orientation. No hiatal hernia. The duodenum and limited view of the small bowel colon are unremarkable. Vascular/Lymphatic: Abdominal aortic normal caliber. No retroperitoneal periportal lymphadenopathy. Musculoskeletal: No aggressive osseous lesion IMPRESSION: 1. Dense bibasilar atelectasis with air bronchograms is concerning for bibasilar pneumonia. 2. Normal gastric anatomy with stomach in typical orientation. NG tube in stomach. These results will be called to the ordering clinician or  representative by the Radiologist Assistant, and communication documented in the PACS or zVision Dashboard. Electronically Signed   By: Suzy Bouchard M.D.   On: 01/21/2017 17:06   Dg Chest Port 1 View  Result Date: 01/22/2017 CLINICAL DATA:  Dysphagia and hypoxia EXAM: PORTABLE CHEST 1 VIEW COMPARISON:  01/21/2017 FINDINGS: Endotracheal tube, right-sided jugular central line and nasogastric catheter are again seen and stable. Cardiac shadow is stable. Bibasilar infiltrative changes are noted worse on the left than the right. Stable small left pleural effusion is noted. No new focal abnormality is seen. IMPRESSION: No significant change from the previous day. Electronically Signed   By: Inez Catalina M.D.   On: 01/22/2017 07:56   SIGNIFICANT EVENTS  IVIG 1/30, 1/31, 2/1, 2/2  STUDIES:  MR brain 1/29>>>negative LP 1/30>>>glucose 55, RBC 6, WBC 3, protein 79  CULTURES: CSF 1/30>>>negative BC x 2 2/1>>> negative Respiratory culture 2/3 >>> rare gpc pairs>>>normal flora  ANTIBIOTICS: Diflucan 1/31 >>2/8 Unasyn 2/1>>>2/3 vanc 2/3>>>2/10 Zosyn 2/3>>>2/10  LINES/TUBES: ETT 2/1>>> Right IJ CVL 2/1>>>  ASSESSMENT / PLAN:  PULMONARY Acute respiratory failure - in setting neuromuscular weakness presumed r/t guillane barre.   ?aspiration  P:   Full vent support, no weaning Trach on 10 AM 2/8 F/u CXR in AM F/u ABG in AM IVIG per neuro - now complete  NEUROLOGIC Guillain barre syndrome  Sedation needs on vent  A-fib with RVR P:   RASS goal: -1 Fentanyl gtt  D/C propofol in AM after trach PRN versed Neuro following  IVIg per neuro - complete, ?EMG study for prognostication  CARDIOVASCULAR Hx HTN  P:  Hold home losartan  D/C Levophed Cardizem for HR control  RENAL Significant hyponatremia - new onset.  Low serum osm, high urine osm.  Likely r/t IVIG due to hyperproteinemia.    Continues to drop Na even with 3% P:   F/u chem  KVO IVF D/Ced 3% saline Hold further  lasix for now Replace electrolytes as indicated  GASTROINTESTINAL Dysphagia  P:   NPO  PPI  TF  Will need PEG tube likely with IR  HEMATOLOGIC Leukocytosis  P:  Monitor fever curve  SQ Heparin  F/u cbc   INFECTIOUS ? Aspiration  Leukocytosis  P:   Vanc/zosyn day 8, d/c D/C Diflucan  ENDOCRINE No active issue  P:   Monitor glucose on chem   FAMILY  - Updates:  Husband updated at length at bedside 2/8, trach and G-tube in AM  - Inter-disciplinary family meet or Palliative Care meeting due by: 2/8  The patient is critically ill with multiple organ systems failure and requires high complexity decision making for assessment and support, frequent evaluation and titration of therapies, application of advanced monitoring technologies and extensive interpretation of multiple databases.   Critical Care Time devoted to patient care services described in this note is  37  Minutes. This time reflects time of care of this signee  Dr Jennet Maduro. This critical care time does not reflect procedure time, or teaching time or supervisory time of PA/NP/Med student/Med Resident etc but could involve care discussion time.  Rush Farmer, M.D. [facility] Pulmonary/Critical Care Medicine. Pager: [phone]. After hours pager: [phone].

## 2017-01-22 NOTE — Consult Note (Signed)
Cardiology Consult    Patient ID: Joanne Morrison MRN: [id number], DOB/AGE: 1952/65/15   Admit date: 01/12/2017 Date of Consult: 01/22/2017  Primary Physician: Jerlyn Ly, MD Primary Cardiologist: Not established Requesting Provider: Dr. Nelda Marseille  Patient Profile    66 y/o woman with a history of HTN, anxiety, and paroxysmal atrial flutter per her family that developed quadriplegia and ventilator dependent respiratory failure from Guillain Barre syndrome following acute pharyngitis. She has been showing slight improvement after IVIG treatment but she appeared to develop episodes of bradycardia with hypotension and intermittent atrial flutter today.  Past Medical History   Past Medical History:  Diagnosis Date  . Anxiety   . Arthritis    back  . Chronic cystitis   . Chronic low back pain   . Depression   . Diverticulosis of colon   . Hemorrhoids   . History of colon polyps    09-24-2009  rectal hyperplastic polyp/   and 2016 non-cancerous  . History of endometriosis   . History of panic attacks   . Hyperlipidemia   . Osteopenia   . Pelvic pain   . Sensation of pressure in bladder area   . Wears glasses     Past Surgical History:  Procedure Laterality Date  . BUNIONECTOMY Left 2004 approx  . CARPAL TUNNEL RELEASE Bilateral right 2014;  left 2007  . COLONOSCOPY W/ POLYPECTOMY  09/24/2009  . CYSTO WITH HYDRODISTENSION [name] 10/15/2016   Procedure: CYSTOSCOPY/HYDRODISTENSION AND MARCAINE INSTILLATION;  Surgeon: Rana Snare, MD;  Location: [facility];  Service: Urology;  Laterality: [name];  . CYSTO/  HYDRODISTENTION/  INSTILLATION THERAPY  1997 approx  . HYSTEROSCOPY W/D&C  11/01/2004   POLYPECTOMY  . LAPAROSCOPIC VAGINAL HYSTERECTOMY WITH SALPINGO OOPHORECTOMY Bilateral 04/20/2012  . TUBAL LIGATION  yrs ago     Allergies  Allergies  Allergen Reactions  . Olanzapine Other (See Comments)    STIFF JOINTS, EXTREMITY WEAKNESS, MEMORY LOSS, LOSS OF BALANCE    . Sumatriptan Other (See Comments)    REACTION: altered mental status  . Graciella Freer D-S] Other (See Comments)    lethargic    History of Present Illness    Joanne Morrison is a 66 y/o woman with a history of HTN, anxiety, and paroxysmal atrial flutter per her family that developed quadriplegia and ventilator dependent respiratory failure from Guillain Barre syndrome leading to a prolonged hospital course. She has remain ventilator dependent with limited improvement after 7 days IVIG but has remains hemodynamically stable. She is also on tube feeds with plan for tracheostomy/G tube now planned for Friday 2/9. She has had several episodes of coarse atrial fibrillation without RVR and also several episodes of sinus bradycardia with blood pressure decreases to 90s SBP. Cardiology was consulted after multiple episodes of bradycardia down to the 40s were witnessed today.  Inpatient Medications    . chlorhexidine gluconate (MEDLINE KIT)  15 mL Mouth Rinse BID  . Chlorhexidine Gluconate Cloth  6 each Topical Daily  . etomidate  40 mg Intravenous Once  . feeding supplement (PRO-STAT SUGAR FREE 64)  30 mL Per Tube BID  . feeding supplement (VITAL AF 1.2 CAL)  1,000 mL Per Tube Q24H  . fentaNYL (SUBLIMAZE) injection  200 mcg Intravenous Once  . heparin subcutaneous  5,000 Units Subcutaneous Q8H  . insulin aspart  0-15 Units Subcutaneous Q4H  . lidocaine  1 patch Transdermal Q24H  . mouth rinse  15 mL Mouth Rinse 10 times per day  . midazolam  4 mg Intravenous Once  . pantoprazole sodium  40 mg Per Tube Q1200  . piperacillin-tazobactam (ZOSYN)  IV  3.375 g Intravenous Q8H  . propofol  5-80 mcg/kg/min Intravenous Once  . sodium chloride flush  10-40 mL Intracatheter Q12H  . sodium chloride flush  3 mL Intravenous Q12H  . vancomycin  1,000 mg Intravenous Q12H  . vecuronium  10 mg Intravenous Once     Outpatient Medications    Prior to Admission medications   Medication Sig Start Date End Date  Taking? Authorizing Provider  benzonatate (TESSALON) 100 MG capsule Take 100 mg by mouth 3 (three) times daily as needed for cough. 01/06/17  Yes Historical Provider, MD  buPROPion (WELLBUTRIN XL) 150 MG 24 hr tablet Take 150 mg by mouth every morning.   Yes Historical Provider, MD  diazepam (VALIUM) 10 MG tablet Take 10 mg by mouth every 12 (twelve) hours as needed for anxiety.   Yes Historical Provider, MD  estradiol (VIVELLE-DOT) 0.1 MG/24HR patch Place 1 patch onto the skin 2 (two) times a week. 12/11/16  Yes Historical Provider, MD  HYDROcodone-acetaminophen (NORCO/VICODIN) 5-325 MG tablet Take 1-2 tablets by mouth every 6 (six) hours as needed for moderate pain.    Yes Historical Provider, MD  losartan (COZAAR) 25 MG tablet Take 25 mg by mouth daily. 01/01/17  Yes Historical Provider, MD  Promethazine-Phenyleph-Codeine (PROMETHAZINE VC/CODEINE) 6.25-5-10 MG/5ML SYRP Take 5 mLs by mouth daily as needed for cough. 01/09/17  Yes Historical Provider, MD  rizatriptan (MAXALT) 10 MG tablet Take 1 tablet (10 mg total) by mouth as needed. May repeat in 2 hours if needed 12/05/13  Yes Hendricks Limes, MD  zolpidem (AMBIEN) 10 MG tablet Take 10 mg by mouth at bedtime.   Yes Historical Provider, MD  azithromycin (ZITHROMAX) 250 MG tablet Take 250 mg by mouth daily. 01/03/17   Historical Provider, MD  levofloxacin (LEVAQUIN) 500 MG tablet Take 500 mg by mouth daily. 01/06/17   Historical Provider, MD  predniSONE (DELTASONE) 10 MG tablet Take 10-40 mg by mouth daily. 01/06/17   Historical Provider, MD     Family History    Family History  Problem Relation Age of Onset  . Hypertension Mother   . Dementia Mother   . Lung cancer Mother     smoker  . Osteoporosis Mother   . Heart attack Father 33  . Hypertension Brother   . Diabetes Maternal Aunt   . Cancer Maternal Uncle     RENAL  . Heart attack Maternal Grandmother 86  . Lung cancer Maternal Grandfather     Ecologist  . Pulmonary embolism  Brother     ? etiology  . Anesthesia problems Neg Hx   . Stroke Neg Hx     Social History    Social History   Social History  . Marital status: Married    Spouse name: [name]  . Number of children: [name]  . Years of education: [name]   Occupational History  . Not on file.   Social History Main Topics  . Smoking status: Never Smoker  . Smokeless tobacco: Never Used  . Alcohol use No  . Drug use: No  . Sexual activity: Not on file   Other Topics Concern  . Not on file   Social History Narrative  . No narrative on file     Review of Systems    Review of systems limited by patient status - intubated and sedated  Physical Exam  Blood pressure 113/75, pulse 95, temperature 98.6 F (37 C), temperature source Oral, resp. rate 20, height 5' 2"  (1.575 m), weight 161 lb 2.5 oz (73.1 kg), SpO2 94 %.  General: Sedated on ventilator Psych: Sedated Neuro: Not following commands on exam HEENT: Conjunctivae are injected Neck: No bruits or JVD. Lungs:  Ventilator assisted breaths, good air movement bilaterally Heart: RRR no s3, s4, or murmurs. Abdomen: Soft, non-distended, BS + x 4.  Extremities: No clubbing, cyanosis or edema. DP/PT/Radials 2+ and equal bilaterally.  Labs     Lab Results  Component Value Date   WBC 15.4 (H) 01/22/2017   HGB 9.5 (L) 01/22/2017   HCT 29.5 (L) 01/22/2017   MCV 81.5 01/22/2017   PLT 357 01/22/2017    Recent Labs Lab 01/19/17 0345  01/22/17 0257  NA 132*  < > 134*  K 3.5  < > 3.9  CL 97*  < > 95*  CO2 29  < > 30  BUN 15  < > 30*  CREATININE 0.80  < > 0.80  CALCIUM 8.5*  < > 8.9  PROT 7.9  --   --   BILITOT 0.7  --   --   ALKPHOS 435*  --   --   ALT 191*  --   --   AST 245*  --   --   GLUCOSE 120*  < > 124*  < > = values in this interval not displayed. Lab Results  Component Value Date   CHOL 212 (H) 12/05/2013   HDL 75.80 12/05/2013   LDLCALC 110 (H) 12/03/2012   TRIG 145 01/21/2017      Radiology Studies    Ct Abdomen  Wo Contrast  Result Date: 01/21/2017 CLINICAL DATA:  Dysphagia  EXAM: CT ABDOMEN WITHOUT CONTRAST  IMPRESSION: 1. Dense bibasilar atelectasis with air bronchograms is concerning for bibasilar pneumonia. 2. Normal gastric anatomy with stomach in typical orientation. NG tube in stomach.   Dg Chest Port 1 View  Result Date: 01/22/2017 CLINICAL DATA:  Dysphagia and hypoxia  COMPARISON:  01/21/2017  FINDINGS: Endotracheal tube, right-sided jugular central line and nasogastric catheter are again seen and stable. Cardiac shadow is stable. Bibasilar infiltrative changes are noted worse on the left than the right. Stable small left pleural effusion is noted. No new focal abnormality is seen. IMPRESSION: No significant change from the previous day.  Dg Fluoro Guide Lumbar Puncture  Result Date: 01/13/2017 CLINICAL DATA:  Sudden onset of inability to walk. Possible Guillain-Barre syndrome.  EXAM: DIAGNOSTIC LUMBAR PUNCTURE UNDER FLUOROSCOPIC GUIDANCE FLUOROSCOPY IMPRESSION: Fluoroscopic guided lumbar puncture with 8.5 cc clear CSF obtained for appropriate laboratory evaluation.   ECG & Cardiac Imaging    2/8 @1150  ECG: Sinus rhythm with normal rate and no consistent segment abnormalities, significant baseline artifact is present  Assessment & Plan    1. Paroxysmal coarse afib/aflutter: She has had several nonsustained episodes of coarse afib on telemetry without significant RVR or hemodynamic changes. This may be a baseline problem for her according to the history provided by her husband. It may also be secondary to catecholamine elevations from her critical illness w/ quadriplegia, pneumonia, ventilator. She is off levophed. Echocardiography would be useful to identify any structural problem that could explain this. Decisions for anticoagulation or pacing can be delayed and see how problematic this remains if she improves clinically. -Will order TTE -Does not need intervention unless this becomes  sustained or hemodynamically significant -She might need anticoagulation or invasive procedure in the  future if she improves much from this current illness  2. Bradycardia: Sinus bradycardia with lowest rate about 45 based on telemetry review and blood pressure down to 90s. This is not sustained and hard to call symptomatic given her mental status. She has been receiving some doses of IV beta blocker for her hypertension and tachycardia that could be contributing to this low rate. I am not sure if autonomic dysregulation is common with Guillain Barre syndrome. -For now let's try to avoid additional BBlocker dosing with labetalol -Will follow up telemetry report again tomorrow  3. Hypertension: On losartan at home. Currently just getting hydralazine or labetalol PRN. She is still dropping down to low-normal pressures occasionally so doesn't need to aggressively tighten control urgently. -Might be able to tolerate a CCB for longer acting BP control if she stops dropping to [place], MD PGY-II Internal Medicine Resident Pager# [phone] 01/22/2017, 5:36 PM  Attending Note:   The patient was seen and examined.  Agree with assessment and plan as noted above.  Changes made to the above note as needed.  Patient seen and independently examined with Ignacia Marvel PGY-2.   We discussed all aspects of the encounter. I agree with the assessment and plan as stated above.  1. Paroxysmal atrial fib:   She likely has some dysautonomia related to her Guillian Barre syndrome. At present, she does not have a rhythm that is worrisime.  I would wait and see if her arrhythmias correct once she is better  No indication for pacer at this point     I have spent a total of 40 minutes with patient reviewing hospital  notes , telemetry, EKGs, labs and examining patient as well as establishing an assessment and plan that was discussed with the patient. > 50% of time was spent in direct patient  care.    Thayer Headings, Brooke Bonito., MD, [facility] 01/23/2017, 3:14 PM [address],  [place] Pager [phone]

## 2017-01-22 NOTE — Progress Notes (Signed)
Noticed patient having EKG changes on monitor @1145 , ccm aware, EKG obtained. Pt having fluctuating heart rate 80s to 40s, Dr. Nelda Marseille aware and consulted Cardiology. Will continue to monitor closely.

## 2017-01-22 NOTE — Progress Notes (Signed)
Occupational Therapy Treatment Patient Details Name: Verone Pari MRN: [id number] DOB: 06-03-1951 Today's Date: 01/22/2017    History of present illness Patient is a 66 y.o. female with medical history significant of chronic low back pain, anxiety, depression, hypertension-recently had sorethroat (+strep) presented to the ED on 1/29 with lower ext weakness,tingling numbess that started in the lower extremities and subsequently started involving her upper extremities. Unable to elicit DTR in the lower ext. MRI of the entire spine negative. Concern for GBS-started on empiric IVIG by neurology.Developed respiratory failure and was intubated 2/1 and trasnferred to ICU.Marland Kitchen Trach 01/22/17.   OT comments  Pt seen while sedation lifted. Pt awake and following commands. Pt with increased BUE movement as noted below. Pt with active movement of B wrists/digits. At this time, recommend husband continue to ROM program in composite extension. Will further assess need for hand splints next week. At this time, do not recommend using resting hand splints. Will progress mobility as tolerated. If medically stable, recommend nsg place pt in Chair position BID.   Follow Up Recommendations  CIR;Supervision/Assistance - 24 hour    Equipment Recommendations  Other (comment)    Recommendations for Other Services Rehab consult    Precautions / Restrictions Precautions Precautions: Fall Precaution Comments: trach/vent Required Braces or Orthoses: Other Brace/Splint Other Brace/Splint: B PRAFO               ADL                                         General ADL Comments: Total A at this time      Vision      unable to close eyes           Additional Comments: "blurry" vision - reported with head nod    Perception     Praxis      Cognition     Overall Cognitive Status: Difficult to assess                       Extremity/Trunk Assessment   increased movement  BUE RUE appaers stronger than L; distal strength greater than proximal strength RUE: shoulders 1/5; elbow flex 2+/5;ext 2+/5; wrist flex/ext 2/5; gross composite flexion . Minimal extensionwith @ 45 degree lag in MPs and IPs LUE similar to RUE, although weaker gross grasp (able to complete composite flexion) and minimal extension Able to give thumbs up B, R stronger than L. Unable to isloate other digits  Able to nod yes/no Able to demonstrate elevation/de[pression of shoulder.           Exercises General Exercises - Upper Extremity Shoulder Flexion: PROM;AAROM;5 reps;Both;Supine (tp 90) Shoulder ABduction: AAROM;Both;5 reps;Supine Shoulder ADduction: PROM;Both;5 reps;Supine Elbow Flexion: AAROM;Both;5 reps;Supine Elbow Extension: AAROM;Both;5 reps;Supine Wrist Flexion: AAROM;Both;5 reps;Supine Wrist Extension: AAROM;Both;5 reps;Supine Digit Composite Flexion: Both;10 reps;Supine Composite Extension: AAROM;Both;10 reps;Supine   Shoulder Instructions       General Comments  Husband present and educated on need to complete full composite extension of elbow/wirst and fingers. Husband demonstrated understanding.    Pertinent Vitals/ Pain       Pain Assessment: Faces Faces Pain Scale: No hurt  Home Living  Prior Functioning/Environment              Frequency  Min 2X/week        Progress Toward Goals  OT Goals(current goals can now be found in the care plan section)  Progress towards OT goals: Goals drowngraded-see care plan;Not progressing toward goals - comment (medical decline. vent/sedated)  Acute Rehab OT Goals Patient Stated Goal: unable to state. Per family - to go to CIR OT Goal Formulation: With family Time For Goal Achievement: 02/05/17 Potential to Achieve Goals: Good ADL Goals Pt Will Perform Grooming: with mod assist;sitting;bed level Pt Will Perform Upper Body Bathing: with mod assist;bed  level Pt/caregiver will Perform Home Exercise Program: Increased strength;Both right and left upper extremity;With minimal assist;Increased ROM Additional ADL Goal #1: Pt will tolerate EOB x 10 min with Max A in npreparation for ADL and mobility  Plan Discharge plan remains appropriate    Co-evaluation                 End of Session     Activity Tolerance Patient tolerated treatment well   Patient Left in bed;with call bell/phone within reach;with family/visitor present;with nursing/sitter in room   Nurse Communication Other (comment) (positioning BUE)        Time: [id number] OT Time Calculation (min): 18 min  Charges: OT General Charges $OT Visit: 1 Procedure OT Treatments $Therapeutic Activity: 8-22 mins  Paolina Karwowski,HILLARY 01/22/2017, 1:40 PM   [facility], OTR/L  [phone] 01/22/2017

## 2017-01-22 NOTE — Progress Notes (Signed)
NIF -10, VC 150 consistently hard to tell the effort

## 2017-01-22 NOTE — Progress Notes (Signed)
Patient ID: Joanne Morrison, female   DOB: 1951/08/04, 66 y.o.   MRN: [id number]  IR aware of G tube request  Noted for Trach Fr 2/9 Wbc 15.4 afeb  Will plan for G tube [facility] 2/12

## 2017-01-22 NOTE — Care Management Note (Signed)
Case Management Note  Patient Details  Name: Joanne Morrison MRN: [id number] Date of Birth: Jan 17, 1951  Subjective/Objective:     Adm w weakness, vent               Action/Plan:lives w husband   Expected Discharge Date:                  Expected Discharge Plan:  Long Term Acute Care (LTAC)  In-House Referral:  Clinical Social Work  Discharge planning Services  CM Consult  Post Acute Care Choice:    Choice offered to:     DME Arranged:    DME Agency:     HH Arranged:    [place] Agency:     Status of Service:  In process, will continue to follow  If discussed at Long Length of Stay Meetings, dates discussed:    Additional Comments:dr yacoub had req ltac eval. [facility], [facility] work w [facility] on e[facility] case. Spoke w husband, he would prefer [facility] if [facility] will work on [facility]. Asked corina w [facility] to start working w [facility] to see if we can get pt approved for there. Cont to follow.  Lacretia Leigh, RN 01/22/2017, 9:18 AM

## 2017-01-23 ENCOUNTER — Inpatient Hospital Stay (HOSPITAL_COMMUNITY): Payer: Medicare Other

## 2017-01-23 DIAGNOSIS — G610 Guillain-Barre syndrome: Secondary | ICD-10-CM | POA: Diagnosis not present

## 2017-01-23 DIAGNOSIS — I4891 Unspecified atrial fibrillation: Secondary | ICD-10-CM | POA: Diagnosis not present

## 2017-01-23 DIAGNOSIS — J9600 Acute respiratory failure, unspecified whether with hypoxia or hypercapnia: Secondary | ICD-10-CM | POA: Diagnosis not present

## 2017-01-23 DIAGNOSIS — I480 Paroxysmal atrial fibrillation: Secondary | ICD-10-CM | POA: Diagnosis not present

## 2017-01-23 DIAGNOSIS — R531 Weakness: Secondary | ICD-10-CM | POA: Diagnosis not present

## 2017-01-23 DIAGNOSIS — J690 Pneumonitis due to inhalation of food and vomit: Secondary | ICD-10-CM | POA: Diagnosis not present

## 2017-01-23 LAB — CBC
HEMATOCRIT: 30.6 % — AB (ref 36.0–46.0)
HEMOGLOBIN: 9.9 g/dL — AB (ref 12.0–15.0)
MCH: 26.2 pg (ref 26.0–34.0)
MCHC: 32.4 g/dL (ref 30.0–36.0)
MCV: 81 fL (ref 78.0–100.0)
Platelets: 376 10*3/uL (ref 150–400)
RBC: 3.78 MIL/uL — AB (ref 3.87–5.11)
RDW: 15.1 % (ref 11.5–15.5)
WBC: 16.6 10*3/uL — ABNORMAL HIGH (ref 4.0–10.5)

## 2017-01-23 LAB — RENAL FUNCTION PANEL
ANION GAP: 12 (ref 5–15)
Albumin: 1.9 g/dL — ABNORMAL LOW (ref 3.5–5.0)
BUN: 34 mg/dL — ABNORMAL HIGH (ref 6–20)
CO2: 27 mmol/L (ref 22–32)
Calcium: 9.1 mg/dL (ref 8.9–10.3)
Chloride: 97 mmol/L — ABNORMAL LOW (ref 101–111)
Creatinine, Ser: 0.85 mg/dL (ref 0.44–1.00)
GFR calc Af Amer: >60 mL/min (ref >60)
GFR calc non Af Amer: >60 mL/min (ref >60)
GLUCOSE: 112 mg/dL — AB (ref 65–99)
PHOSPHORUS: 4.5 mg/dL (ref 2.5–4.6)
POTASSIUM: 3.7 mmol/L (ref 3.5–5.1)
Sodium: 136 mmol/L (ref 135–145)

## 2017-01-23 LAB — GLUCOSE, CAPILLARY
GLUCOSE-CAPILLARY: 127 mg/dL — AB (ref 65–99)
GLUCOSE-CAPILLARY: 165 mg/dL — AB (ref 65–99)
Glucose-Capillary: 109 mg/dL — ABNORMAL HIGH (ref 65–99)
Glucose-Capillary: 125 mg/dL — ABNORMAL HIGH (ref 65–99)
Glucose-Capillary: 129 mg/dL — ABNORMAL HIGH (ref 65–99)
Glucose-Capillary: 145 mg/dL — ABNORMAL HIGH (ref 65–99)

## 2017-01-23 LAB — PROTIME-INR
INR: 1.05
Prothrombin Time: 13.7 seconds (ref 11.4–15.2)

## 2017-01-23 LAB — ECHOCARDIOGRAM COMPLETE
Height: 62 in
Weight: 2518.54 oz

## 2017-01-23 LAB — BASIC METABOLIC PANEL
Anion gap: 11 (ref 5–15)
BUN: 34 mg/dL — ABNORMAL HIGH (ref 6–20)
CHLORIDE: 97 mmol/L — AB (ref 101–111)
CO2: 27 mmol/L (ref 22–32)
CREATININE: 0.83 mg/dL (ref 0.44–1.00)
Calcium: 9 mg/dL (ref 8.9–10.3)
GFR calc non Af Amer: >60 mL/min (ref >60)
Glucose, Bld: 122 mg/dL — ABNORMAL HIGH (ref 65–99)
POTASSIUM: 3.5 mmol/L (ref 3.5–5.1)
Sodium: 135 mmol/L (ref 135–145)

## 2017-01-23 LAB — APTT: APTT: 35 s (ref 24–36)

## 2017-01-23 LAB — PHOSPHORUS: PHOSPHORUS: 4.5 mg/dL (ref 2.5–4.6)

## 2017-01-23 LAB — MAGNESIUM: Magnesium: 2 mg/dL (ref 1.7–2.4)

## 2017-01-23 MED ORDER — ATROPINE SULFATE 1 MG/10ML IJ SOSY
PREFILLED_SYRINGE | INTRAMUSCULAR | Status: AC
  Filled 2017-01-23: qty 10

## 2017-01-23 MED ORDER — DOCUSATE SODIUM 50 MG/5ML PO LIQD
100.0000 mg | Freq: Two times a day (BID) | ORAL | Status: DC | PRN
  Administered 2017-01-24: 100 mg via ORAL
  Filled 2017-01-23: qty 10

## 2017-01-23 MED ORDER — HEPARIN SODIUM (PORCINE) 5000 UNIT/ML IJ SOLN
5000.0000 [IU] | Freq: Three times a day (TID) | INTRAMUSCULAR | Status: DC
  Administered 2017-01-27 – 2017-02-04 (×25): 5000 [IU] via SUBCUTANEOUS
  Filled 2017-01-23 (×25): qty 1

## 2017-01-23 NOTE — Progress Notes (Signed)
  Echocardiogram 2D Echocardiogram has been performed.  Diamond Nickel 01/23/2017, 12:28 PM

## 2017-01-23 NOTE — Progress Notes (Signed)
Name: Joanne Morrison MRN: [id number] DOB: January 28, 1951    ADMISSION DATE:  01/12/2017 CONSULTATION DATE:  01/15/2017  REFERRING MD :  Sloan Leiter ([facility])   CHIEF COMPLAINT:  Dyspnea, potential for respiratory compromise   BRIEF PATIENT DESCRIPTION:  66 yo female presented with weakness and myalgias for 3 to 4 days.  She had recently completed therapy for Strep throat.  Concern was that she developed Guillain Barre syndrome, and she was started on IVIG 1/30.  She progressed to respiratory failure on 2/01 and required intubation.     SUBJECTIVE:  No events overnight, no acute changes  VITAL SIGNS: BP 97/64   Pulse 80   Temp 99.4 F (37.4 C) (Oral)   Resp 20   Ht 5\' 2"  (1.575 m)   Wt 71.4 kg (157 lb 6.5 oz)   SpO2 93%   BMI 28.79 kg/m   INTAKE / OUTPUT: I/O last 3 completed shifts: In: 3383.5 [I.V.:1203.5; NG/GT:1530; IV Piggyback:650] Out: 3195 [id number]  General: Acutely ill appearing female Neuro: Diffusely weak, eyes open and unable to close Eyes: pupils reactive, eyes open, unable to close with lacrilube in them Cardiac: RRR, Nl S1/S2, -M/R/G. Chest: Diffuse crackles noted Abd: Soft, NT, ND and +BS Ext: 1+ edema and -tenderness Skin no rashes  LABS:  BMET  Recent Labs Lab 01/21/17 0308 01/22/17 0257 01/23/17 0334  NA 134* 134* 135  K 4.2 3.9 3.5  CL 94* 95* 97*  CO2 30 30 27   BUN 30* 30* 34*  CREATININE 0.94 0.80 0.83  GLUCOSE 143* 124* 122*   Electrolytes  Recent Labs Lab 01/21/17 0308 01/22/17 0257 01/23/17 0334  CALCIUM 9.1 8.9 9.0  MG 2.4 2.1 2.0  PHOS 4.7* 4.2 4.5   CBC  Recent Labs Lab 01/21/17 0308 01/22/17 0257 01/23/17 0334  WBC 17.5* 15.4* 16.6*  HGB 10.2* 9.5* 9.9*  HCT 31.1* 29.5* 30.6*  PLT 365 357 376   Coag's  Recent Labs Lab 01/23/17 0334  APTT 35  INR 1.05    Sepsis Markers No results for input(s): LATICACIDVEN, PROCALCITON, O2SATVEN in the last 168 hours. ABG  Recent Labs Lab 01/18/17 0420  01/21/17 0242 01/22/17 0320  PHART 7.409 7.476* 7.473*  PCO2ART 37.8 41.8 42.5  PO2ART 105 83.2 82.2*   Liver Enzymes  Recent Labs Lab 01/19/17 0345 01/20/17 0430 01/21/17 0308 01/22/17 0257  AST 245*  --   --   --   ALT 191*  --   --   --   ALKPHOS 435*  --   --   --   BILITOT 0.7  --   --   --   ALBUMIN 1.5* 1.6* 1.7* 1.7*   Cardiac Enzymes No results for input(s): TROPONINI, PROBNP in the last 168 hours.  Glucose  Recent Labs Lab 01/22/17 1144 01/22/17 1601 01/22/17 2028 01/23/17 0019 01/23/17 0407 01/23/17 0715  GLUCAP 166* 115* 135* 129* 127* 109*   Imaging Dg Chest Port 1 View  Result Date: 01/23/2017 CLINICAL DATA:  66 year old female admitted for lower extremity weakness, worsening myalgia and inability to walk. Recent strep pharyngitis. Suspected Guillain-Barre. Deterioration of respiratory status, subsequently intubated.  Initial encounter. EXAM: PORTABLE CHEST 1 VIEW COMPARISON:  01/22/2017 and earlier. FINDINGS: Portable AP semi upright view at 0429 hours. Stable endotracheal tube tip at the level the clavicles. Stable right IJ central line. Enteric tube courses to the abdomen and the side hole is at the level of the stomach. Dense left lung base opacity obscuring the left hemidiaphragm persists.  Patchy and nodular right lung base opacity persists but visualization of the right hemidiaphragm has improved. No pneumothorax or pulmonary edema. Possible small left pleural effusion. Stable cardiac size and mediastinal contours. IMPRESSION: 1.  Stable lines and tubes. 2. Mildly improved right lung base ventilation with residual reticulonodular opacity. Continued left lower lobe collapse/consolidation and probable small left pleural effusion. Electronically Signed   By: Genevie Ann M.D.   On: 01/23/2017 07:43   SIGNIFICANT EVENTS  IVIG 1/30, 1/31, 2/1, 2/2  STUDIES:  MR brain 1/29>>>negative LP 1/30>>>glucose 55, RBC 6, WBC 3, protein 79  CULTURES: CSF  1/30>>>negative BC x 2 2/1>>> negative Respiratory culture 2/3 >>> rare gpc pairs>>>normal flora  ANTIBIOTICS: Diflucan 1/31 >>2/8 Unasyn 2/1>>>2/3 vanc 2/3>>>2/10 Zosyn 2/3>>>2/10  LINES/TUBES: ETT 2/1>>>2/9 Trach [employer]) 2/9>>> Right IJ CVL 2/1>>>  ASSESSMENT / PLAN:  PULMONARY Acute respiratory failure - in setting neuromuscular weakness presumed r/t guillane barre.   ?aspiration  P:   Full vent support, no weaning given neuro status Trach today Titrate O2 for sat of 88-92%  NEUROLOGIC Guillain barre syndrome  Sedation needs on vent  A-fib with RVR P:   RASS goal: -1 Fentanyl gtt  D/C propofol today PRN versed Neuro following  IVIg per neuro - complete, ?EMG study for prognostication  CARDIOVASCULAR Hx HTN  P:  Hold home losartan  D/C Levophed Cardizem for HR control  RENAL Significant hyponatremia - new onset.  Low serum osm, high urine osm.  Likely r/t IVIG due to hyperproteinemia.    Continues to drop Na even with 3% P:   F/u chem  KVO IVF D/Ced 3% saline Hold further lasix for now Replace electrolytes as indicated  GASTROINTESTINAL Dysphagia  P:   NPO  PPI  TF  Will need PEG tube - IR consulted  HEMATOLOGIC Leukocytosis  P:  Monitor fever curve  SQ Heparin  F/u cbc   INFECTIOUS ? Aspiration  Leukocytosis  P:   Vanc/zosyn day 8, d/c D/C Diflucan  ENDOCRINE No active issue  P:   Monitor glucose on chem   FAMILY  - Updates:  Husband updated at length at bedside 2/9, trach today, g-tube per IR  - Inter-disciplinary family meet or Palliative Care meeting due by: 2/8  The patient is critically ill with multiple organ systems failure and requires high complexity decision making for assessment and support, frequent evaluation and titration of therapies, application of advanced monitoring technologies and extensive interpretation of multiple databases.   Critical Care Time devoted to patient care services described in this  note is  37  Minutes. This time reflects time of care of this signee Dr Jennet Maduro. This critical care time does not reflect procedure time, or teaching time or supervisory time of PA/NP/Med student/Med Resident etc but could involve care discussion time.  Rush Farmer, M.D. [facility] Pulmonary/Critical Care Medicine. Pager: [phone]. After hours pager: [phone].

## 2017-01-23 NOTE — Progress Notes (Signed)
Paged Elink and spoke with Dr. Daryel November about HR 120-140s, previous consult of cards say autonomic dysreflexia and MD aware of fluctuation of HR and sometimes coarse A fib, No new orders. WIll continue to monitor closely.

## 2017-01-23 NOTE — Progress Notes (Signed)
Pharmacy Antibiotic Note  Joanne Morrison is a 66 y.o. female admitted on 01/12/2017 with suspected Guillian Barre syndrome, suspected aspiration PNA.  Pharmacy has been consulted for vancomycin and zosyn dosing (day 7 of antibiotics). Plans for a total of 8 days antibiotics. -WBC= 16.6, afebrile, SCr= 0.83 and CrCl ~ 60 -culture  Plan: Continue Vancomycin 1g IV q 12 hrs  Height: 5\' 2"  (157.5 cm) Weight: 157 lb 6.5 oz (71.4 kg) IBW/kg (Calculated) : 50.1  Temp (24hrs), Avg:98.9 F (37.2 C), Min:97.8 F (36.6 C), Max:99.7 F (37.6 C)   Recent Labs Lab 01/19/17 0345 01/19/17 2215 01/20/17 0430 01/20/17 0622 01/21/17 0308 01/22/17 0257 01/23/17 0334  WBC 15.6*  --   --  14.4* 17.5* 15.4* 16.6*  CREATININE 0.80  --  0.82  --  0.94 0.80 0.83  VANCOTROUGH  --  16  --   --   --   --   --     Estimated Creatinine Clearance: 63.3 mL/min (by C-G formula based on SCr of 0.83 mg/dL).    Antimicrobials this admission:  Unasyn 2/1 >> 2/3 Fluconazole 2/1 >>  2/8 Vanc  2/3 >> (2/10) Zosyn 2/3 >> (2/10)  Dose adjustments this admission:  2/5 VT = 16> con't 1g q 12  Microbiology results:  2/1 BCx: neg 1/29 BCx: neg 1/30 CSF: neg 2/1 MRSA PCR: *neg 2/3 Sputum > normal flora    Hildred Laser, Pharm D 01/23/2017 7:48 AM

## 2017-01-23 NOTE — Progress Notes (Signed)
Unable to perform NIF and VC due to pt's mental status and the inability to initiate any breaths on her own.

## 2017-01-23 NOTE — Progress Notes (Signed)
Physical Therapy Treatment Patient Details Name: Joanne Morrison MRN: [id number] DOB: Apr 21, 1951 Today's Date: 01/23/2017    History of Present Illness Patient is a 66 y.o. female with medical history significant of chronic low back pain, anxiety, depression, hypertension-recently had sorethroat (+strep) presented to the ED on 1/29 with lower ext weakness,tingling numbess that started in the lower extremities and subsequently started involving her upper extremities. Unable to elicit DTR in the lower ext. MRI of the entire spine negative. Concern for GBS-started on empiric IVIG by neurology.Developed respiratory failure and was intubated 2/1 and trasnferred to ICU.Marland Kitchen Trach 01/23/17.    PT Comments    Pt more alert and following some commands. Only movement in LE noted is slight toe movement.  Follow Up Recommendations  LTACH     Equipment Recommendations  Other (comment) (to be assessed )    Recommendations for Other Services       Precautions / Restrictions Precautions Precautions: Fall Precaution Comments: trach/vent; monitor BP Required Braces or Orthoses: Other Brace/Splint Other Brace/Splint: B PRAFO  Restrictions Weight Bearing Restrictions: No    Mobility  Bed Mobility               General bed mobility comments: With bed in chair position and myself on one side and OT on other side with pt holding onto our arms at elbows and [place] at hers we asked her to pull herself forward to get her back off of the bed--she did attempt to do this but with really only using her trunk muslces and not her arms  Transfers                    Ambulation/Gait                 Stairs            Wheelchair Mobility    Modified Rankin (Stroke Patients Only)       Balance                                    Cognition Arousal/Alertness: Awake/alert Behavior During Therapy: WFL for tasks assessed/performed Overall Cognitive Status: Difficult to  assess                 General Comments: Following 1 step commands inconsistently or delayed for Bil UE/LE movements, sticking out tongue, closing mouth, closing eyes    Exercises General Exercises - Lower Extremity Ankle Circles/Pumps: PROM;5 reps;Supine Heel Slides: PROM;Both;5 reps;Supine Straight Leg Raises: PROM;5 reps;Both;Supine (rt to only 30 degrees, lt to 40 degrees) Other Exercises Other Exercises: Pt able to open and close both hands with flexion being easier than extension and sometimes not able/processing opening hand; can wave with wrist on LUE, but not on right (not sure she can't v. not understanding what I was asking)--also had trouble with this for palm up and palm down. With forearm supported and asking her to reach up and touch her chin with her thumb she was 1+/5 on left and 1/5 on right     General Comments        Pertinent Vitals/Pain Pain Assessment: Faces Faces Pain Scale: Hurts even more Pain Location: hamstrings with stretch R>L Pain Descriptors / Indicators: Grimacing Pain Intervention(s): Limited activity within patient's tolerance;Repositioned    Home Living  Prior Function            PT Goals (current goals can now be found in the care plan section) Progress towards PT goals: Progressing toward goals    Frequency    Min 3X/week      PT Plan Current plan remains appropriate    Co-evaluation PT/OT/SLP Co-Evaluation/Treatment: Yes Reason for Co-Treatment: Complexity of the patient's impairments (multi-system involvement) PT goals addressed during session: Strengthening/ROM OT goals addressed during session: Strengthening/ROM     End of Session Equipment Utilized During Treatment: Oxygen (vent) Activity Tolerance: Patient tolerated treatment well Patient left: in bed;with call bell/phone within reach;with nursing/sitter in room     Time: [id number] PT Time Calculation (min) (ACUTE ONLY): 19  min  Charges:  $Therapeutic Activity: 8-22 mins                    G CodesShary Decamp [place] 02/06/2017, 4:16 PM [place]

## 2017-01-23 NOTE — Progress Notes (Signed)
Occupational Therapy Treatment Patient Details Name: Joanne Morrison MRN: [id number] DOB: 01-13-1951 Today's Date: 01/23/2017    History of present illness Patient is a 66 y.o. female with medical history significant of chronic low back pain, anxiety, depression, hypertension-recently had sorethroat (+strep) presented to the ED on 1/29 with lower ext weakness,tingling numbess that started in the lower extremities and subsequently started involving her upper extremities. Unable to elicit DTR in the lower ext. MRI of the entire spine negative. Concern for GBS-started on empiric IVIG by neurology.Developed respiratory failure and was intubated 2/1 and trasnferred to ICU.Marland Kitchen Trach 01/23/17.   OT comments  This 66 yo female admitted with medical history per above presents to acute OT today with making progress with following commands and Bil UE AROM. She will continue to benefit from acute OT with follow up on CIR.   Follow Up Recommendations  CIR;Supervision/Assistance - 24 hour    Equipment Recommendations  Other (comment) (TBD at next venue)    Recommendations for Other Services Rehab consult    Precautions / Restrictions Precautions Precautions: Fall Precaution Comments: trach/vent; monitor BP Required Braces or Orthoses: Other Brace/Splint Other Brace/Splint: B PRAFO  Restrictions Weight Bearing Restrictions: No       Mobility Bed Mobility               General bed mobility comments: With bed in chair position and myself on one side and PT on other side with pt holding onto our arms at elbows and [place] at hers we asked her to pull herself forward to get her back off of the bed--she did attempt to do this but with really only using her trunk muslces and not her arms                 Vision                 Additional Comments: Pt with eyes open continuously, asked her to try and close them today and you could see them move minimally R>L          Cognition    Behavior During Therapy: [facility] for tasks assessed/performed (BP did go up significantly when we sat her up in chair position (RN thinks that anytime she is anxious her BP goes up)--RN in to give BP meds)                    General Comments: Following 1 step commands inconsistently or delayed for Bil UE/LE movements, sticking out tongue, closing mouth, closing eyes      Exercises Other Exercises Other Exercises: Pt able to open and close both hands with flexion being easier than extension and sometimes not able/processing opening hand; can wave with wrist on LUE, but not on right (not sure she can't v. not understanding what I was asking)--also had trouble with this for palm up and palm down. With forearm supported and asking her to reach up and touch her chin with her thumb she was 1+/5 on left and 1/5 on right            Pertinent Vitals/ Pain       Pain Assessment: Faces Faces Pain Scale: Hurts even more Pain Location: hamstrings with stretch (L>R) Pain Descriptors / Indicators: Grimacing Pain Intervention(s): Limited activity within patient's tolerance;Monitored during session;Repositioned         Frequency  Min 2X/week        Progress Toward Goals  OT Goals(current goals can now be found  in the care plan section)  Progress towards OT goals: Progressing toward goals     Plan Discharge plan remains appropriate    Co-evaluation    PT/OT/SLP Co-Evaluation/Treatment: Yes Reason for Co-Treatment: Complexity of the patient's impairments (multi-system involvement)   OT goals addressed during session: Strengthening/ROM         Activity Tolerance  (limited by significant increase in BP)   Patient Left in bed;with nursing/sitter in room   Nurse Communication  (increased in BP)        Time: 1431-1455 OT Time Calculation (min): 24 min  Charges: OT General Charges $OT Visit: 1 Procedure OT Treatments $Therapeutic Activity: 8-22 mins  Almon Register [id number] 01/23/2017, 3:17 PM

## 2017-01-23 NOTE — Consult Note (Signed)
Chief Complaint: Patient was seen in consultation today for percutaneous gastric tube placement Chief Complaint  Patient presents with  . Weakness   at the request of Dr Fatima Blank  Referring Physician(s): Dr Fatima Blank  Supervising Physician: Sandi Mariscal  Patient Status: [facility] - In-pt  History of Present Illness: Joanne Morrison is a 66 y.o. female   Normal state of health and developed weakness; myalgias for 4 days To ED and admitted 01/12/17 to [facility] and transferred to [facility] She states later that she actually has had some numbness and tingling and weakness in legs for few weeks Has gone on progress to SOB and now respiratory failure Dyspnea and dysphagia Quadriparesis  Work up concerning for [employer] Syndrome Started IVIG 1/30  Trach placement today  Request for percutaneous gastric tube placement per Dr Nelda Marseille Imaging reviewed with Dr Anselm Pancoast and approves procedure   Past Medical History:  Diagnosis Date  . Anxiety   . Arthritis    back  . Chronic cystitis   . Chronic low back pain   . Depression   . Diverticulosis of colon   . Hemorrhoids   . History of colon polyps    09-24-2009  rectal hyperplastic polyp/   and 2016 non-cancerous  . History of endometriosis   . History of panic attacks   . Hyperlipidemia   . Osteopenia   . Pelvic pain   . Sensation of pressure in bladder area   . Wears glasses     Past Surgical History:  Procedure Laterality Date  . BUNIONECTOMY Left 2004 approx  . CARPAL TUNNEL RELEASE Bilateral right 2014;  left 2007  . COLONOSCOPY W/ POLYPECTOMY  09/24/2009  . CYSTO WITH HYDRODISTENSION [name] 10/15/2016   Procedure: CYSTOSCOPY/HYDRODISTENSION AND MARCAINE INSTILLATION;  Surgeon: Rana Snare, MD;  Location: [facility];  Service: Urology;  Laterality: [name];  . CYSTO/  HYDRODISTENTION/  INSTILLATION THERAPY  1997 approx  . HYSTEROSCOPY W/D&C  11/01/2004   POLYPECTOMY  . LAPAROSCOPIC VAGINAL HYSTERECTOMY WITH SALPINGO  OOPHORECTOMY Bilateral 04/20/2012  . TUBAL LIGATION  yrs ago    Allergies: Olanzapine; Sumatriptan; and Jonna Coup [uretron d-s]  Medications: Prior to Admission medications   Medication Sig Start Date End Date Taking? Authorizing Provider  benzonatate (TESSALON) 100 MG capsule Take 100 mg by mouth 3 (three) times daily as needed for cough. 01/06/17  Yes Historical Provider, MD  buPROPion (WELLBUTRIN XL) 150 MG 24 hr tablet Take 150 mg by mouth every morning.   Yes Historical Provider, MD  diazepam (VALIUM) 10 MG tablet Take 10 mg by mouth every 12 (twelve) hours as needed for anxiety.   Yes Historical Provider, MD  estradiol (VIVELLE-DOT) 0.1 MG/24HR patch Place 1 patch onto the skin 2 (two) times a week. 12/11/16  Yes Historical Provider, MD  HYDROcodone-acetaminophen (NORCO/VICODIN) 5-325 MG tablet Take 1-2 tablets by mouth every 6 (six) hours as needed for moderate pain.    Yes Historical Provider, MD  losartan (COZAAR) 25 MG tablet Take 25 mg by mouth daily. 01/01/17  Yes Historical Provider, MD  Promethazine-Phenyleph-Codeine (PROMETHAZINE VC/CODEINE) 6.25-5-10 MG/5ML SYRP Take 5 mLs by mouth daily as needed for cough. 01/09/17  Yes Historical Provider, MD  rizatriptan (MAXALT) 10 MG tablet Take 1 tablet (10 mg total) by mouth as needed. May repeat in 2 hours if needed 12/05/13  Yes Hendricks Limes, MD  zolpidem (AMBIEN) 10 MG tablet Take 10 mg by mouth at bedtime.   Yes Historical Provider, MD  azithromycin (  ZITHROMAX) 250 MG tablet Take 250 mg by mouth daily. 01/03/17   Historical Provider, MD  levofloxacin (LEVAQUIN) 500 MG tablet Take 500 mg by mouth daily. 01/06/17   Historical Provider, MD  predniSONE (DELTASONE) 10 MG tablet Take 10-40 mg by mouth daily. 01/06/17   Historical Provider, MD     Family History  Problem Relation Age of Onset  . Hypertension Mother   . Dementia Mother   . Lung cancer Mother     smoker  . Osteoporosis Mother   . Heart attack Father 10  . Hypertension  Brother   . Diabetes Maternal Aunt   . Cancer Maternal Uncle     RENAL  . Heart attack Maternal Grandmother 86  . Lung cancer Maternal Grandfather     Ecologist  . Pulmonary embolism Brother     ? etiology  . Anesthesia problems Neg Hx   . Stroke Neg Hx     Social History   Social History  . Marital status: Married    Spouse name: [name]  . Number of children: [name]  . Years of education: [name]   Social History Main Topics  . Smoking status: Never Smoker  . Smokeless tobacco: Never Used  . Alcohol use No  . Drug use: No  . Sexual activity: Not Asked   Other Topics Concern  . None   Social History Narrative  . None    Review of Systems: A 12 point ROS discussed and pertinent positives are indicated in the HPI above.  All other systems are negative.  Review of Systems  Constitutional: Positive for activity change.  Respiratory:       Trach/vent    Vital Signs: BP (!) 174/101 (BP Location: Right Arm)   Pulse (!) 106   Temp 99.5 F (37.5 C) (Axillary)   Resp 20   Ht 5\' 2"  (1.575 m)   Wt 157 lb 6.5 oz (71.4 kg)   SpO2 93%   BMI 28.79 kg/m   Physical Exam  Cardiovascular: Normal rate and regular rhythm.   Pulmonary/Chest:  Trach/vent  Abdominal: Soft. Bowel sounds are normal.  Musculoskeletal:  Moving jaw Tries to communicate slightly  Skin: Skin is warm.  Psychiatric:  Consented husband at bedside  Nursing note and vitals reviewed.   Mallampati Score:  MD Evaluation Airway: WNL Heart: WNL Abdomen: WNL Chest/ Lungs: WNL ASA  Classification: 3 Mallampati/Airway Score:  (trach)  Imaging: Ct Abdomen Wo Contrast  Result Date: 01/21/2017 CLINICAL DATA:  Dysphagia EXAM: CT ABDOMEN WITHOUT CONTRAST TECHNIQUE: Multidetector CT imaging of the abdomen was performed following the standard protocol without IV contrast. COMPARISON:  None. FINDINGS: Lower chest: There is dense bibasilar atelectasis with air bronchograms suggesting pneumonia. Hepatobiliary: No  focal hepatic lesions on noncontrast exam. Gallbladder normal. Pancreas: Normal pancreatic parenchymal intensity. No ductal dilatation or inflammation. The stomach in typical orientation first Spleen: Normal spleen. Adrenals/urinary tract: Adrenal glands and kidneys are normal. Stomach/Bowel: NG tube extends into the gastric body. The stomach is in typical orientation. No hiatal hernia. The duodenum and limited view of the small bowel colon are unremarkable. Vascular/Lymphatic: Abdominal aortic normal caliber. No retroperitoneal periportal lymphadenopathy. Musculoskeletal: No aggressive osseous lesion IMPRESSION: 1. Dense bibasilar atelectasis with air bronchograms is concerning for bibasilar pneumonia. 2. Normal gastric anatomy with stomach in typical orientation. NG tube in stomach. These results will be called to the ordering clinician or representative by the Radiologist Assistant, and communication documented in the PACS or zVision Dashboard. Electronically Signed  By: Suzy Bouchard M.D.   On: 01/21/2017 17:06   Mr Jeri Cos [id number] Contrast  Result Date: 01/12/2017 CLINICAL DATA:  Bilateral lower extremity weakness and paresthesia EXAM: MRI HEAD WITHOUT AND WITH CONTRAST TECHNIQUE: Multiplanar, multiecho pulse sequences of the brain and surrounding structures were obtained without and with intravenous contrast. CONTRAST:  25mL MULTIHANCE GADOBENATE DIMEGLUMINE 529 MG/ML IV SOLN COMPARISON:  None. FINDINGS: Brain: The brain has normal appearance without evidence of malformation, atrophy, old or acute small or large vessel infarction, hemorrhage, hydrocephalus or extra-axial collection. No pituitary abnormality. No abnormal enhancement occurs. Vascular: Major vessels at the base of the brain show flow. Skull and upper cervical spine: Normal Sinuses/Orbits: Clear/ normal. Other: None significant. IMPRESSION: Normal exam. Electronically Signed   By: Nelson Chimes M.D.   On: 01/12/2017 16:37   Mr Cervical Spine W  Wo Contrast  Result Date: 01/12/2017 CLINICAL DATA:  Lower extremity abnormal sensation in weakness beginning 3 days ago which is worsening. EXAM: MRI CERVICAL SPINE WITHOUT AND WITH CONTRAST TECHNIQUE: Multiplanar and multiecho pulse sequences of the cervical spine, to include the craniocervical junction and cervicothoracic junction, were obtained without and with intravenous contrast. CONTRAST:  108mL MULTIHANCE GADOBENATE DIMEGLUMINE 529 MG/ML IV SOLN COMPARISON:  None. FINDINGS: Study Sever's from motion degradation. Alignment: Straightening of the normal cervical lordosis. Vertebrae: No fracture or primary bone lesion. Some discogenic endplate edema at [id number] could contribute to neck pain. Cord: No cord compression or primary cord lesion. Posterior Fossa, vertebral arteries, paraspinal tissues: Negative Disc levels: Foramen magnum, C1-2 and C2-3: Unremarkable. Facet arthropathy at C2-3 right more than left but without stenosis. C3-4: Mild bulging of the disc. No central canal stenosis. Facet arthropathy right more than left. No neural compression. C4-5: Endplate osteophytes. No compressive narrowing of the canal or foramina. C5-6: Spondylosis with endplate osteophytes that efface the ventral subarachnoid space. AP diameter of the canal is 8 mm. No actual cord compression. Mild foraminal stenosis bilaterally. C6-7: Spondylosis with endplate osteophytes and bulging of the disc. Narrowing of the ventral subarachnoid space but no cord compression. Foramina sufficiently patent. C7-T1:  Mild bulging of the disc.  No neural compression. IMPRESSION: Chronic degenerative changes of the cervical spine with spondylosis from C3-4 through C6-7. No cord compression or primary cord lesion. Canal narrowing at C5-6 with AP diameter of 8 mm. The subarachnoid space is effaced but the cord is not compressed. Foraminal narrowing at this level could possibly affect either or both C6 nerve roots. Electronically Signed   By: Nelson Chimes M.D.   On: 01/12/2017 16:27   Mr Thoracic Spine W Wo Contrast  Result Date: 01/12/2017 CLINICAL DATA:  bilateral lower extremity weakness and paresthesia EXAM: MRI THORACIC SPINE WITH and without  CONTRAST 15 cc MultiHance TECHNIQUE: Multiplanar, multisequence MR imaging of the thoracic spine was performed following the administration of intravenous contrast. COMPARISON:  None. FINDINGS: Alignment:  Mild thoracic curvature convex to the left. Vertebrae: No fracture or primary bone lesion in the thoracic region. Cord:  No cord compression or primary cord lesion. Paraspinal and other soft tissues: Negative Disc levels: No degenerative disc disease in the thoracic region. No bulge or herniation. No canal or foraminal stenosis. IMPRESSION: Negative MRI of the thoracic spine.  No stenosis.  No cord lesion. Electronically Signed   By: Nelson Chimes M.D.   On: 01/12/2017 16:34   Mr Lumbar Spine W Wo Contrast  Result Date: 01/12/2017 CLINICAL DATA:  Lower extremity decreased sensation an weakness. EXAM:  MRI LUMBAR SPINE WITHOUT AND WITH CONTRAST TECHNIQUE: Multiplanar and multiecho pulse sequences of the lumbar spine were obtained without and with intravenous contrast. CONTRAST:  6mL MULTIHANCE GADOBENATE DIMEGLUMINE 529 MG/ML IV SOLN COMPARISON:  10/18/2016 FINDINGS: Segmentation:  L5 is transitional, as described previously. Alignment:  Curvature convex to the right with the apex at L1. Vertebrae:  No fracture or primary bone lesion. Conus medullaris: Extends to the L1 level and appears normal. Paraspinal and other soft tissues: Negative Disc levels: L1-2: Mild bulging of the disc.  No stenosis or neural compression. L2-3: Moderate bulging of the disc. Facet and ligamentous hypertrophy. Mild multifactorial stenosis. Some potential for neural compression in the lateral recesses, right more than left. L3-4: Endplate osteophytes and bulging of the disc. Facet and ligamentous hypertrophy. Narrowing of the  lateral recesses that could cause neural compression, right more than left. L4-5: Bilateral facet arthropathy with anterolisthesis of 7 mm. Bulging of the disc. Narrowing of the lateral recesses and neural foramina right more than left. Neural compression could occur at this level, particularly on the right. L5-S1: Transitional and unremarkable. No apparent change since November. IMPRESSION: No change since November 2017. Transitional anatomy with L5 transitional. Bilateral facet arthropathy at L4-5 with anterolisthesis of 7 mm. Stenosis of the lateral recesses and foramina right more than left. Neural compression could occur at this level, particularly on the right. Lateral recess stenosis at L2-3 and L3-4 that could possibly cause neural compression. Electronically Signed   By: Nelson Chimes M.D.   On: 01/12/2017 16:32   Dg Chest Port 1 View  Result Date: 01/23/2017 CLINICAL DATA:  Status post tracheostomy. EXAM: PORTABLE CHEST 1 VIEW COMPARISON:  Film earlier today at 0430 hours FINDINGS: Tracheostomy present which appears in appropriate position. Right jugular central line shows stable positioning. Lungs show stable bilateral lower lobe consolidation, left greater than right. Stable probable small left pleural effusion. No edema or pneumothorax. IMPRESSION: Appropriate radiographic appearance of tracheostomy. Stable bilateral lower lobe pulmonary airspace disease. Electronically Signed   By: Aletta Edouard M.D.   On: 01/23/2017 10:53   Dg Chest Port 1 View  Result Date: 01/23/2017 CLINICAL DATA:  66 year old female admitted for lower extremity weakness, worsening myalgia and inability to walk. Recent strep pharyngitis. Suspected Guillain-Barre. Deterioration of respiratory status, subsequently intubated.  Initial encounter. EXAM: PORTABLE CHEST 1 VIEW COMPARISON:  01/22/2017 and earlier. FINDINGS: Portable AP semi upright view at 0429 hours. Stable endotracheal tube tip at the level the clavicles. Stable  right IJ central line. Enteric tube courses to the abdomen and the side hole is at the level of the stomach. Dense left lung base opacity obscuring the left hemidiaphragm persists. Patchy and nodular right lung base opacity persists but visualization of the right hemidiaphragm has improved. No pneumothorax or pulmonary edema. Possible small left pleural effusion. Stable cardiac size and mediastinal contours. IMPRESSION: 1.  Stable lines and tubes. 2. Mildly improved right lung base ventilation with residual reticulonodular opacity. Continued left lower lobe collapse/consolidation and probable small left pleural effusion. Electronically Signed   By: Genevie Ann M.D.   On: 01/23/2017 07:43   Dg Chest Port 1 View  Result Date: 01/22/2017 CLINICAL DATA:  Dysphagia and hypoxia EXAM: PORTABLE CHEST 1 VIEW COMPARISON:  01/21/2017 FINDINGS: Endotracheal tube, right-sided jugular central line and nasogastric catheter are again seen and stable. Cardiac shadow is stable. Bibasilar infiltrative changes are noted worse on the left than the right. Stable small left pleural effusion is noted. No new focal  abnormality is seen. IMPRESSION: No significant change from the previous day. Electronically Signed   By: Inez Catalina M.D.   On: 01/22/2017 07:56   Dg Chest Port 1 View  Result Date: 01/21/2017 CLINICAL DATA:  Hypoxia EXAM: PORTABLE CHEST 1 VIEW COMPARISON:  January 20, 2017 FINDINGS: Endotracheal tube tip is 2.8 cm above carina. Nasogastric tube side port below the diaphragm. Central catheter tip is at the cavoatrial junction. No pneumothorax. There is patchy airspace opacity in both lower lung zone with small left pleural effusion. Lungs elsewhere are clear. Heart is mildly enlarged with pulmonary vascularity within normal limits. No adenopathy. No bone lesions. IMPRESSION: Tube and catheter positions as described without pneumothorax. Patchy airspace consolidation lung bases, stable. Small left pleural effusion. No new  opacity. Stable cardiac prominence. Electronically Signed   By: Lowella Grip III M.D.   On: 01/21/2017 07:05   Dg Chest Port 1 View  Result Date: 01/20/2017 CLINICAL DATA:  Respiratory failure EXAM: PORTABLE CHEST 1 VIEW COMPARISON:  01/19/2017 FINDINGS: Endotracheal tube tip is at the clavicular heads. Right IJ central line with tip at the SVC level. An orogastric tube reaches stomach. Unchanged streaky basilar opacities, worse on the left where there is also air bronchograms. Heart size is likely normal given technique. No edema, effusion, or pneumothorax. IMPRESSION: 1. Stable positioning of tubes and central line. 2. Unchanged left more than right pneumonia or atelectasis. Electronically Signed   By: Monte Fantasia M.D.   On: 01/20/2017 07:13   Dg Chest Port 1 View  Result Date: 01/19/2017 CLINICAL DATA:  Shortness of breath, acute respiratory failure and pneumonia, Guillain-Barre syndrome EXAM: PORTABLE CHEST 1 VIEW COMPARISON:  Portable chest x-ray of January 18, 2017 FINDINGS: The lungs are reasonably well inflated. Bibasilar atelectasis or pneumonia persists. Small amount of pleural fluid on the left is suspected. The endotracheal tube tip lies 3.2 cm above the carina. The esophagogastric tube tip in proximal port project below the inferior margin of the image. The right internal jugular venous catheter tip projects over the middle portion of the SVC. IMPRESSION: Stable appearance of the chest. The support tubes are in reasonable position. Bibasilar atelectasis or pneumonia. Electronically Signed   By: David  Martinique M.D.   On: 01/19/2017 07:04   Dg Chest Port 1 View  Result Date: 01/18/2017 CLINICAL DATA:  Pneumonia. EXAM: PORTABLE CHEST 1 VIEW COMPARISON:  January 17, 2017 FINDINGS: Support apparatus including the ETT is stable. No pneumothorax. Infiltrate in the right lung base persist but is mildly improved. Increased opacity in the left lung base obscuring the left hemidiaphragm. No other  change. IMPRESSION: 1. Stable support apparatus. 2. Persistent but mildly improved right basilar infiltrate. 3. Increased infiltrate in the left base. Electronically Signed   By: Dorise Bullion III M.D   On: 01/18/2017 07:49   Dg Chest Port 1 View  Result Date: 01/17/2017 CLINICAL DATA:  66 year old female admitted for lower extremity weakness, worsening myalgia and inability to walk. Recent strep pharyngitis. Suspected Guillain-Barre. Deterioration of respiratory stress status, subsequently intubated. Possible aspiration. Initial encounter. EXAM: PORTABLE CHEST 1 VIEW COMPARISON:  01/15/2017 and earlier. FINDINGS: Portable AP semi upright view at 0513 hours. Stable endotracheal tube tip at the level the clavicles. Enteric tube courses to the left abdomen, tip not included. Kyphotic positioning. Continued dense and confluent retrocardiac opacity. Increased patchy left perihilar opacity. Mildly increased patchy and veiling right lung opacity. No pneumothorax. No pulmonary edema. Stable cardiac size and mediastinal contours. IMPRESSION: 1.  Stable lines and tubes. 2. Continued left lower lobe collapse or consolidation. Interval worsening left perihilar and right lung base opacity which may represent a combination of airspace disease and small pleural effusion. Suspect bilateral pneumonia which could be the sequelae of aspiration in this clinical setting. Electronically Signed   By: Genevie Ann M.D.   On: 01/17/2017 07:17   Dg Chest Port 1 View  Result Date: 01/15/2017 CLINICAL DATA:  Central line placement. EXAM: PORTABLE CHEST 1 VIEW COMPARISON:  Chest x-ray from earlier today. FINDINGS: Endotracheal tube has been placed with tip well positioned less than 2 cm above the carina. Right IJ central line in place with tip adequately positioned at the level of the mid SVC. No pneumothorax appreciated. There is a dense opacity at the left lung base, atelectasis versus pneumonia. Probable small left pleural effusion as  well. Right lung remains clear. Elevation the right hemidiaphragm is stable in the short-term interval. Osseous structures about the chest are unremarkable. IMPRESSION: 1. Right IJ central line adequately positioned with tip at the level of the mid SVC. No pneumothorax seen. 2. Endotracheal tube well positioned with tip just above the level of the carina. 3. Dense opacity at the left lung base, atelectasis versus pneumonia, probable small left pleural effusion. Electronically Signed   By: Franki Cabot M.D.   On: 01/15/2017 18:26   Dg Chest Port 1 View  Result Date: 01/15/2017 CLINICAL DATA:  Shortness of breath . EXAM: PORTABLE CHEST 1 VIEW COMPARISON:  No recent prior . FINDINGS: Mediastinum hilar structures are normal. Cardiomegaly with normal pulmonary vascularity. Low lung volumes. Mild bibasilar infiltrates. Small left pleural effusion cannot be excluded . IMPRESSION: Low lung volumes. Mild bibasilar infiltrates. Small left pleural effusion cannot be excluded. Electronically Signed   By: Marcello Moores  Register   On: 01/15/2017 13:12   Dg Abd Portable 1v  Result Date: 01/23/2017 CLINICAL DATA:  Feeding tube placement EXAM: PORTABLE ABDOMEN - 1 VIEW COMPARISON:  01/15/2017 FINDINGS: Feeding tube tip is in the distal stomach. Nonobstructive bowel gas pattern. IMPRESSION: Feeding tube tip in the distal stomach. Electronically Signed   By: Rolm Baptise M.D.   On: 01/23/2017 11:45   Dg Abd Portable 1v  Result Date: 01/15/2017 CLINICAL DATA:  Feeding tube placement. EXAM: PORTABLE ABDOMEN - 1 VIEW COMPARISON:  None. FINDINGS: Feeding tube appears appropriately positioned within the expected region of the stomach body. Visualized bowel gas pattern is nonobstructive. There is a patulous gas-filled loop of bowel within the left abdomen which likely has thickened walls. No evidence of soft tissue mass or abnormal fluid collection. No evidence free intraperitoneal air seen. Mild degenerative change noted within the  scoliotic lumbar spine. IMPRESSION: 1. Feeding tube appears appropriately positioned in the left abdomen, expected region of the stomach body. 2. Probable bowel wall thickening in the left abdomen, uncertain if large or small bowel, suspicious for enteritis or colitis. Electronically Signed   By: Franki Cabot M.D.   On: 01/15/2017 20:52   Dg Fluoro Guide Lumbar Puncture  Result Date: 01/13/2017 CLINICAL DATA:  Sudden onset of inability to walk. Possible Guillain-Barre syndrome. EXAM: DIAGNOSTIC LUMBAR PUNCTURE UNDER FLUOROSCOPIC GUIDANCE FLUOROSCOPY TIME:  Fluoroscopy Time:  0 minutes and 42 seconds Radiation Exposure Index (if provided by the fluoroscopic device): 4.1 mGy Number of Acquired Spot Images: 0 PROCEDURE: Informed consent was obtained from the patient prior to the procedure, including potential complications of headache, allergy, and pain. With the patient prone, the lower back was prepped  with Betadine. 1% Lidocaine was used for local anesthesia. Lumbar puncture was performed at the L3-4 level using a 20 gauge needle with return of clear CSF. 8.5 Ml of CSF were obtained for laboratory studies. The patient tolerated the procedure well and there were no apparent complications. IMPRESSION: Fluoroscopic guided lumbar puncture with 8.5 cc clear CSF obtained for appropriate laboratory evaluation. Electronically Signed   By: Marijo Sanes M.D.   On: 01/13/2017 16:49    Labs:  CBC:  Recent Labs  01/20/17 0622 01/21/17 0308 01/22/17 0257 01/23/17 0334  WBC 14.4* 17.5* 15.4* 16.6*  HGB 9.5* 10.2* 9.5* 9.9*  HCT 28.9* 31.1* 29.5* 30.6*  PLT 349 365 357 376    COAGS:  Recent Labs  01/23/17 0334  INR 1.05  APTT 35    BMP:  Recent Labs  01/21/17 0308 01/22/17 0257 01/23/17 0334 01/23/17 0943  NA 134* 134* 135 136  K 4.2 3.9 3.5 3.7  CL 94* 95* 97* 97*  CO2 30 30 27 27   GLUCOSE 143* 124* 122* 112*  BUN 30* 30* 34* 34*  CALCIUM 9.1 8.9 9.0 9.1  CREATININE 0.94 0.80 0.83  0.85  GFRNONAA >60 >60 >60 >60  GFRAA >60 >60 >60 >60    LIVER FUNCTION TESTS:  Recent Labs  01/12/17 1204  01/19/17 0345 01/20/17 0430 01/21/17 0308 01/22/17 0257 01/23/17 0943  BILITOT 0.9  --  0.7  --   --   --   --   AST 28  --  245*  --   --   --   --   ALT 18  --  191*  --   --   --   --   ALKPHOS 46  --  435*  --   --   --   --   PROT 6.7  --  7.9  --   --   --   --   ALBUMIN 4.0  < > 1.5* 1.6* 1.7* 1.7* 1.9*  < > = values in this interval not displayed.  TUMOR MARKERS: No results for input(s): AFPTM, CEA, CA199, CHROMGRNA in the last 8760 hours.  Assessment and Plan:  Dysphagia Dyspnea- respiratory failure Now with trach/vent Guillain Barre syndrome Scheduled for percutaneous gastric tube placement Risks and Benefits discussed with the patient's husband including, but not limited to the need for a barium enema during the procedure, bleeding, infection, peritonitis, or damage to adjacent structures. All of the patient's husband questions were answered, he is agreeable to proceed. Consent signed and in chart.   Thank you for this interesting consult.  I greatly enjoyed meeting Laryn Wolverton and look forward to participating in their care.  A copy of this report was sent to the requesting provider on this date.  Electronically Signed: Monia Sabal A 01/23/2017, 12:25 PM   I spent a total of 40 Minutes    in face to face in clinical consultation, greater than 50% of which was counseling/coordinating care for percutaneous gastric tube placement

## 2017-01-23 NOTE — Procedures (Signed)
Bronchoscopy  for Percutaneous  Tracheostomy  Name: Joanne Morrison MRN: [id number] DOB: 03-14-1951 Procedure: Bronchoscopy for Percutaneous Tracheostomy Indications: Percutaneous Tracheostomy In conjunction with: Dr.Yacoub  Procedure Details Consent: Risks of procedure as well as the alternatives and risks of each were explained to the (patient/caregiver).  Consent for procedure obtained. Time Out: Verified patient identification, verified procedure, site/side was marked, verified correct patient position, special equipment/implants available, medications/allergies/relevent history reviewed, required imaging and test results available.  Performed  In preparation for procedure, patient was given 100% FiO2 and bronchoscope lubricated. Sedation: Benzodiazepines, Muscle relaxants and Etomidate  Airway entered and the following were examined: trachea Procedures performed: Endotracheal Tube retracted in 2 cm increments. Cannulation of airway observed. Dilation observed. Placement of trachel tube  observed . No overt complications. Bronchoscope removed.    Evaluation Hemodynamic Status: BP stable throughout; O2 sats: stable throughout Patient's Current Condition: stable Specimens:  None Complications: No apparent complications Patient did tolerate procedure well.   Richardson Landry Minor ACNP Maryanna Shape PCCM Pager [phone] till 3 pm If no answer page [phone] 01/23/2017, 10:27 AM  I was present and supervised procedure.  Rush Farmer, M.D. [facility] Pulmonary/Critical Care Medicine. Pager: [phone]. After hours pager: [phone].

## 2017-01-23 NOTE — Procedures (Signed)
Bedside Tracheostomy Insertion Procedure Note   Patient Details:   Name: Joanne Morrison DOB: [birth year] MRN: [id number]  Procedure: Tracheostomy  Pre Procedure Assessment: ET Tube Size: 7.5  ET Tube secured at lip (cm): 20 Bite block in place: No Breath Sounds: Clear  Post Procedure Assessment: BP 97/64   Pulse 80   Temp 99.4 F (37.4 C) (Oral)   Resp 20   Ht 5\' 2"  (1.575 m)   Wt 157 lb 6.5 oz (71.4 kg)   SpO2 93%   BMI 28.79 kg/m  O2 sats: stable throughout Complications: No apparent complications Patient did tolerate procedure well Tracheostomy Brand:Shiley Tracheostomy Style:Cuffed Tracheostomy Size: 6.0 Tracheostomy Secured [id number] Tracheostomy Placement Confirmation:Trach cuff visualized and in place    Phillis Knack [facility] 01/23/2017, 10:42 AM

## 2017-01-23 NOTE — Progress Notes (Signed)
Progress Note  Patient Name: Evoleht Hovatter Date of Encounter: 01/23/2017  Primary Cardiologist: Not established  Subjective   Remains intubated and sedated planned for tracheostomy  Inpatient Medications    Scheduled Meds: . atropine      . chlorhexidine gluconate (MEDLINE KIT)  15 mL Mouth Rinse BID  . Chlorhexidine Gluconate Cloth  6 each Topical Daily  . etomidate  40 mg Intravenous Once  . feeding supplement (PRO-STAT SUGAR FREE 64)  30 mL Per Tube BID  . feeding supplement (VITAL AF 1.2 CAL)  1,000 mL Per Tube Q24H  . fentaNYL (SUBLIMAZE) injection  200 mcg Intravenous Once  . heparin subcutaneous  5,000 Units Subcutaneous Q8H  . insulin aspart  0-15 Units Subcutaneous Q4H  . lidocaine  1 patch Transdermal Q24H  . mouth rinse  15 mL Mouth Rinse 10 times per day  . midazolam  4 mg Intravenous Once  . pantoprazole sodium  40 mg Per Tube Q1200  . piperacillin-tazobactam (ZOSYN)  IV  3.375 g Intravenous Q8H  . propofol  5-80 mcg/kg/min Intravenous Once  . sodium chloride flush  10-40 mL Intracatheter Q12H  . sodium chloride flush  3 mL Intravenous Q12H  . vancomycin  1,000 mg Intravenous Q12H  . vecuronium  10 mg Intravenous Once   Continuous Infusions: . diltiazem (CARDIZEM) infusion Stopped (01/20/17 1300)  . fentaNYL infusion INTRAVENOUS 200 mcg/hr (01/23/17 0600)  . norepinephrine (LEVOPHED) Adult infusion Stopped (01/17/17 0500)  . propofol (DIPRIVAN) infusion 35 mcg/kg/min (01/23/17 0936)   PRN Meds: [DISCONTINUED] acetaminophen **OR** acetaminophen, albuterol, artificial tears, hydrALAZINE, labetalol, midazolam, naphazoline-glycerin, ondansetron **OR** ondansetron (ZOFRAN) IV, sodium chloride flush   Vital Signs    Vitals:   01/23/17 0727 01/23/17 0730 01/23/17 0800 01/23/17 0900  BP:   112/74 97/64  Pulse:   60 80  Resp:   20 20  Temp:      TempSrc:  Oral    SpO2: 95%  96% 93%  Weight:      Height:        Intake/Output Summary (Last 24 hours) at  01/23/17 0938 Last data filed at 01/23/17 0900  Gross per 24 hour  Intake          1683.61 ml  Output             1725 ml  Net           -41.39 ml   Filed Weights   01/21/17 0400 01/22/17 0300 01/23/17 0331  Weight: 163 lb (73.9 kg) 161 lb 2.5 oz (73.1 kg) 157 lb 6.5 oz (71.4 kg)     Physical Exam   General: Sedated on ventilator HEENT: Conjunctivae are injected Neck: No bruits or JVD Lungs:  Ventilator assisted breaths Heart: RRR no s3, s4, or murmurs Abdomen: Soft, non-distended, BS + x 4 Extremities: No clubbing, cyanosis or edema. DP/PT/Radials 2+ and equal bilaterally  Labs    Chemistry  Recent Labs Lab 01/19/17 0345 01/20/17 0430 01/21/17 0308 01/22/17 0257 01/23/17 0334  NA 132* 132* 134* 134* 135  K 3.5 3.9 4.2 3.9 3.5  CL 97* 94* 94* 95* 97*  CO2 29 28 30 30 27   GLUCOSE 120* 135* 143* 124* 122*  BUN 15 23* 30* 30* 34*  CREATININE 0.80 0.82 0.94 0.80 0.83  CALCIUM 8.5* 8.7* 9.1 8.9 9.0  PROT 7.9  --   --   --   --   ALBUMIN 1.5* 1.6* 1.7* 1.7*  --   AST 245*  --   --   --   --  ALT 191*  --   --   --   --   ALKPHOS 435*  --   --   --   --   BILITOT 0.7  --   --   --   --   GFRNONAA >60 >60 >60 >60 >60  GFRAA >60 >60 >60 >60 >60  ANIONGAP 6 10 10 9 11      Hematology  Recent Labs Lab 01/21/17 0308 01/22/17 0257 01/23/17 0334  WBC 17.5* 15.4* 16.6*  RBC 3.90 3.62* 3.78*  HGB 10.2* 9.5* 9.9*  HCT 31.1* 29.5* 30.6*  MCV 79.7 81.5 81.0  MCH 26.2 26.2 26.2  MCHC 32.8 32.2 32.4  RDW 15.3 15.6* 15.1  PLT 365 357 376    Radiology    Dg Chest Port 1 View  Result Date: 01/23/2017 CLINICAL DATA: 66 year old female admitted for lower extremity weakness, worsening myalgia and inability to walk. Recent strep pharyngitis. Suspected Guillain-Barre. Deterioration of respiratory status, subsequently intubated.  Initial encounter.   EXAM: PORTABLE CHEST 1 VIEW COMPARISON:  01/22/2017 and earlier.   FINDINGS: Portable AP semi upright view at 0429  hours. Stable endotracheal tube tip at the level the clavicles. Stable right IJ central line. Enteric tube courses to the abdomen and the side hole is at the level of the stomach. Dense left lung base opacity obscuring the left hemidiaphragm persists. Patchy and nodular right lung base opacity persists but visualization of the right hemidiaphragm has improved. No pneumothorax or pulmonary edema. Possible small left pleural effusion. Stable cardiac size and mediastinal contours.  IMPRESSION: 1.  Stable lines and tubes. 2. Mildly improved right lung base ventilation with residual reticulonodular opacity. Continued left lower lobe collapse/consolidation and probable small left pleural effusion.   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 She has been showing slight improvement after IVIG treatment but she appeared to develop episodes of bradycardia with hypotension and intermittent atrial flutter today.  Assessment & Plan    1. Bradycardia: Sinus bradycardia with lowest rate about 45 based on telemetry review and blood pressure down to 90s. This could be due to sick sinus syndrome or autonomic dysfunction from her Guillain-Barre. So far nothing on telemetry is excessively concerning for an unstable rhythm. -TTE is pending -Does not seem severe or unstable enough to warrant pacing at this time -If worsening will plan to discuss with EP  2. Paroxysmal coarse afib/aflutter: Continues episodes of afib without significant RVR. We will see if there is a structural problem on echo, probably no acute intervention  needed. -Try to limit Rx unless becomes very tachycardic due to alternating periods of bradycardia.   Collier Salina, MD PGY-II Internal Medicine Resident Pager# [phone] 01/23/2017, 9:38 AM  Attending Note:   The patient was seen and examined.  Agree with assessment and plan as noted above.  Changes made to the above note as needed.  Patient seen and independently examined with Ignacia Marvel, PGY-2.   We discussed all aspects of the encounter. I agree with the assessment and plan as stated above.  1. PAF:   Pt has had episodes of  PAF GBS can be associated with dysautonomia.  Non of these are severe arrhythmias and I would continue to follow for now. Hopefully will improve as she improves.     I have spent a total of 40 minutes with patient reviewing hospital  notes , telemetry, EKGs, labs and examining patient as well as establishing an assessment and plan that was discussed with the patient. > 50% of time was spent in direct patient care.    Thayer Headings, Brooke Bonito., MD, [facility] 01/23/2017, 4:10 PM [address],  [place] Pager [phone]

## 2017-01-23 NOTE — Procedures (Signed)
Percutaneous Tracheostomy Placement  Consent from family.  Patient sedated, paralyzed and position.  Placed on 100% FiO2 and RR matched.  Area cleaned and draped.  Lidocaine/epi injected.  Skin incision done followed by blunt dissection.  Trachea palpated then punctured, catheter passed and visualized bronchoscopically.  Wire placed and visualized.  Catheter removed.  Airway then crushed and dilated.  Size 6 cuffed shiley trach placed and visualized bronchoscopically well above carina.  Good volume returns.  Patient tolerated the procedure well without complications.  Minimal blood loss.  CXR ordered and pending.  Jaiya Mooradian G. Deno Sida, M.D. [place] Pulmonary/Critical Care Medicine. Pager: [phone]. After hours pager: [phone]. 

## 2017-01-24 DIAGNOSIS — E876 Hypokalemia: Secondary | ICD-10-CM | POA: Diagnosis not present

## 2017-01-24 DIAGNOSIS — J9600 Acute respiratory failure, unspecified whether with hypoxia or hypercapnia: Secondary | ICD-10-CM | POA: Diagnosis not present

## 2017-01-24 DIAGNOSIS — G8250 Quadriplegia, unspecified: Secondary | ICD-10-CM | POA: Diagnosis not present

## 2017-01-24 DIAGNOSIS — B999 Unspecified infectious disease: Secondary | ICD-10-CM | POA: Diagnosis not present

## 2017-01-24 DIAGNOSIS — I480 Paroxysmal atrial fibrillation: Secondary | ICD-10-CM | POA: Diagnosis not present

## 2017-01-24 DIAGNOSIS — G610 Guillain-Barre syndrome: Secondary | ICD-10-CM | POA: Diagnosis not present

## 2017-01-24 LAB — RENAL FUNCTION PANEL
ALBUMIN: 1.7 g/dL — AB (ref 3.5–5.0)
Anion gap: 12 (ref 5–15)
BUN: 24 mg/dL — AB (ref 6–20)
CALCIUM: 8.5 mg/dL — AB (ref 8.9–10.3)
CO2: 26 mmol/L (ref 22–32)
CREATININE: 0.71 mg/dL (ref 0.44–1.00)
Chloride: 100 mmol/L — ABNORMAL LOW (ref 101–111)
GFR calc Af Amer: >60 mL/min (ref >60)
GFR calc non Af Amer: >60 mL/min (ref >60)
Glucose, Bld: 145 mg/dL — ABNORMAL HIGH (ref 65–99)
Phosphorus: 3.7 mg/dL (ref 2.5–4.6)
Potassium: 3.1 mmol/L — ABNORMAL LOW (ref 3.5–5.1)
Sodium: 138 mmol/L (ref 135–145)

## 2017-01-24 LAB — GLUCOSE, CAPILLARY
GLUCOSE-CAPILLARY: 136 mg/dL — AB (ref 65–99)
Glucose-Capillary: 125 mg/dL — ABNORMAL HIGH (ref 65–99)
Glucose-Capillary: 142 mg/dL — ABNORMAL HIGH (ref 65–99)
Glucose-Capillary: 139 mg/dL — ABNORMAL HIGH (ref 65–99)
Glucose-Capillary: 161 mg/dL — ABNORMAL HIGH (ref 65–99)
Glucose-Capillary: 155 mg/dL — ABNORMAL HIGH (ref 65–99)

## 2017-01-24 LAB — CBC
HCT: 30.1 % — ABNORMAL LOW (ref 36.0–46.0)
Hemoglobin: 9.7 g/dL — ABNORMAL LOW (ref 12.0–15.0)
MCH: 26.3 pg (ref 26.0–34.0)
MCHC: 32.2 g/dL (ref 30.0–36.0)
MCV: 81.6 fL (ref 78.0–100.0)
PLATELETS: 386 10*3/uL (ref 150–400)
RBC: 3.69 MIL/uL — AB (ref 3.87–5.11)
RDW: 15.1 % (ref 11.5–15.5)
WBC: 14.1 10*3/uL — ABNORMAL HIGH (ref 4.0–10.5)

## 2017-01-24 LAB — MAGNESIUM: MAGNESIUM: 2.1 mg/dL (ref 1.7–2.4)

## 2017-01-24 LAB — TRIGLYCERIDES: TRIGLYCERIDES: 75 mg/dL (ref <150)

## 2017-01-24 MED ORDER — POTASSIUM CHLORIDE 20 MEQ/15ML (10%) PO SOLN
30.0000 meq | ORAL | Status: AC
  Administered 2017-01-24 (×2): 30 meq
  Filled 2017-01-24 (×2): qty 30

## 2017-01-24 MED ORDER — POTASSIUM CHLORIDE 20 MEQ/15ML (10%) PO SOLN: 40.0000 meq | Freq: Three times a day (TID) | ORAL | Status: DC

## 2017-01-24 MED ORDER — FLEET ENEMA 7-19 GM/118ML RE ENEM
1.0000 | ENEMA | Freq: Once | RECTAL | Status: DC
  Filled 2017-01-24: qty 1

## 2017-01-24 NOTE — Progress Notes (Signed)
[facility] ADULT ICU REPLACEMENT PROTOCOL FOR AM LAB REPLACEMENT ONLY  The patient does apply for the [facility] Adult ICU Electrolyte Replacment Protocol based on the criteria listed below:   1. Is GFR >/= 40 ml/min? Yes.    Patient's GFR today is 60 2. Is urine output >/= 0.5 ml/kg/hr for the last 6 hours? Yes.   Patient's UOP is 1.89 ml/kg/hr 3. Is BUN < 60 mg/dL? Yes.    Patient's BUN today is 24 4. Abnormal electrolyte(s): K=3.1 5. Ordered repletion with: 40meq K x 2 doses 6. If a panic level lab has been reported, has the CCM MD in charge been notified? Yes.  .   Physician:  Dr. Jamelle Haring, [place] 01/24/2017 5:32 AM

## 2017-01-24 NOTE — Progress Notes (Addendum)
Progress Note  Patient Name: Kyiesha Millward Date of Encounter: 01/24/2017  Primary Cardiologist: Not established  Subjective   Post tracheostomy. Now in sinus tach. Dilt drip decreased pressures. More alert  Inpatient Medications    Scheduled Meds: . chlorhexidine gluconate (MEDLINE KIT)  15 mL Mouth Rinse BID  . Chlorhexidine Gluconate Cloth  6 each Topical Daily  . feeding supplement (PRO-STAT SUGAR FREE 64)  30 mL Per Tube BID  . feeding supplement (VITAL AF 1.2 CAL)  1,000 mL Per Tube Q24H  . heparin subcutaneous  5,000 Units Subcutaneous Q8H  . [START ON 01/27/2017] heparin subcutaneous  5,000 Units Subcutaneous Q8H  . insulin aspart  0-15 Units Subcutaneous Q4H  . lidocaine  1 patch Transdermal Q24H  . mouth rinse  15 mL Mouth Rinse 10 times per day  . midazolam  4 mg Intravenous Once  . pantoprazole sodium  40 mg Per Tube Q1200  . piperacillin-tazobactam (ZOSYN)  IV  3.375 g Intravenous Q8H  . potassium chloride  30 mEq Per Tube Q4H  . propofol  5-80 mcg/kg/min Intravenous Once  . sodium chloride flush  10-40 mL Intracatheter Q12H  . sodium chloride flush  3 mL Intravenous Q12H  . vancomycin  1,000 mg Intravenous Q12H   Continuous Infusions: . diltiazem (CARDIZEM) infusion Stopped (01/20/17 1300)  . fentaNYL infusion INTRAVENOUS 300 mcg/hr (01/24/17 0700)   PRN Meds: [DISCONTINUED] acetaminophen **OR** acetaminophen, albuterol, artificial tears, docusate, hydrALAZINE, labetalol, midazolam, naphazoline-glycerin, ondansetron **OR** ondansetron (ZOFRAN) IV, sodium chloride flush   Vital Signs    Vitals:   01/24/17 0700 01/24/17 0756 01/24/17 0800 01/24/17 0812  BP: (!) 152/88  (!) 164/100 (!) 164/100  Pulse: 98  (!) 113 (!) 110  Resp: 20  20 (!) 21  Temp:  98.2 F (36.8 C)    TempSrc:  Oral    SpO2: 95%  94% 96%  Weight:      Height:        Intake/Output Summary (Last 24 hours) at 01/24/17 0820 Last data filed at 01/24/17 0800  Gross per 24 hour  Intake           2168.79 ml  Output             1925 ml  Net           243.79 ml   Filed Weights   01/22/17 0300 01/23/17 0331 01/24/17 0500  Weight: 161 lb 2.5 oz (73.1 kg) 157 lb 6.5 oz (71.4 kg) 160 lb 0.9 oz (72.6 kg)     Physical Exam   General: Eyes open, more alert HEENT: Conjunctivae are injected Neck: No bruits or JVD, Trach Lungs:  Ventilator assisted breaths, Trach Heart: Tachy RR no s3, s4, or murmurs Abdomen: Soft, non-distended, BS + x 4 Extremities: No clubbing, cyanosis or edema. DP/PT/Radials 2+ and equal bilaterally  Labs    Chemistry  Recent Labs Lab 01/19/17 0345  01/22/17 0257 01/23/17 0334 01/23/17 0943 01/24/17 0441  NA 132*  < > 134* 135 136 138  K 3.5  < > 3.9 3.5 3.7 3.1*  CL 97*  < > 95* 97* 97* 100*  CO2 29  < > _0 GLUCOSE 120*  < > 124* 122* 112* 145*  BUN 15  < > 30* 34* 34* 24*  CREATININE 0.80  < > 0.80 0.83 0.85 0.71  CALCIUM 8.5*  < > 8.9 9.0 9.1 8.5*  PROT 7.9  --   --   --   --   --  ALBUMIN 1.5*  < > 1.7*  --  1.9* 1.7*  AST 245*  --   --   --   --   --   ALT 191*  --   --   --   --   --   ALKPHOS 435*  --   --   --   --   --   BILITOT 0.7  --   --   --   --   --   GFRNONAA >60  < > >60 >60 >60 >60  GFRAA >60  < > >60 >60 >60 >60  ANIONGAP 6  < > _0 < > = values in this interval not displayed.   Hematology  Recent Labs Lab 01/22/17 0257 01/23/17 0334 01/24/17 0438  WBC 15.4* 16.6* 14.1*  RBC 3.62* 3.78* 3.69*  HGB 9.5* 9.9* 9.7*  HCT 29.5* 30.6* 30.1*  MCV 81.5 81.0 81.6  MCH 26.2 26.2 26.3  MCHC 32.2 32.4 32.2  RDW 15.6* 15.1 15.1  PLT 357 376 386    Radiology    Dg Chest Port 1 View  Result Date: 01/23/2017 CLINICAL DATA: 66 year old female admitted for lower extremity weakness, worsening myalgia and inability to walk. Recent strep pharyngitis. Suspected Guillain-Barre. Deterioration of respiratory status, subsequently intubated.  Initial encounter.   EXAM: PORTABLE CHEST 1 VIEW COMPARISON:   01/22/2017 and earlier.   FINDINGS: Portable AP semi upright view at 0429 hours. Stable endotracheal tube tip at the level the clavicles. Stable right IJ central line. Enteric tube courses to the abdomen and the side hole is at the level of the stomach. Dense left lung base opacity obscuring the left hemidiaphragm persists. Patchy and nodular right lung base opacity persists but visualization of the right hemidiaphragm has improved. No pneumothorax or pulmonary edema. Possible small left pleural effusion. Stable cardiac size and mediastinal contours.  IMPRESSION: 1.  Stable lines and tubes. 2. Mildly improved right lung base ventilation with residual reticulonodular opacity. Continued left lower lobe collapse/consolidation and probable small left pleural effusion.   Telemetry    Continued episodes of sinus bradycardia down to 40s and some of coarse afib/aflutter without RVR - Personally Reviewed  ECG    [name]  Cardiac Studies   ECHO 01/23/17  - Left ventricle: The cavity size was normal. There was mild   concentric hypertrophy. Systolic function was vigorous. The   estimated ejection fraction was in the range of 65% to 70%. Wall   motion was normal; there were no regional wall motion   abnormalities. The study was not technically sufficient to allow   evaluation of LV diastolic dysfunction due to atrial   fibrillation. - Aortic valve: Trileaflet; normal thickness leaflets. There was no   regurgitation. - Aortic root: The aortic root was normal in size. - Mitral valve: Structurally normal valve. There was no   regurgitation. - Left atrium: The atrium was normal in size. - Right ventricle: The cavity size was normal. Wall thickness was   normal. Systolic function was normal. - Right atrium: The atrium was normal in size. - Tricuspid valve: There was trivial regurgitation. - Pulmonary arteries: Systolic pressure was within the normal   range. - Inferior vena cava: The vessel was  normal in size. - Pericardium, extracardiac: There was no pericardial effusion.  Patient Profile     66 y/o woman with a history of HTN, anxiety, and paroxysmal atrial flutter per her family that developed quadriplegia and ventilator dependent respiratory failure  from [place] syndrome following acute pharyngitis. She has been showing slight improvement after IVIG treatment but she appeared to develop episodes of bradycardia with hypotension and intermittent atrial flutter.  Assessment & Plan    1. Bradycardia: Sinus bradycardia with lowest rate about 45 based on telemetry review and blood pressure down to 90s. This could be due to sick sinus syndrome or autonomic dysfunction from her Guillain-Barre. So far nothing on telemetry is excessively concerning for an unstable rhythm. -ECHO normal -Does not seem severe or unstable enough to warrant pacing at this time -If worsening will plan to discuss with EP  2. Paroxysmal coarse afib/aflutter: resolved to sinus tachy -Try to limit Rx unless becomes very tachycardic due to alternating periods of bradycardia.  3. Hypokalemia  - replete  4. Rosalee Kaufman  - supportive care No new cardiac issues, please call if needed.  Husband in room. Discussed  Candee Furbish, MD

## 2017-01-24 NOTE — Progress Notes (Signed)
Name: Joanne Morrison MRN: [id number] DOB: [birth year]    ADMISSION DATE:  01/12/2017 CONSULTATION DATE:  01/15/2017  REFERRING MD :  Sloan Leiter ([facility])   CHIEF COMPLAINT:  Dyspnea, potential for respiratory compromise   BRIEF PATIENT DESCRIPTION:  66 yo female presented with weakness and myalgias for 3 to 4 days.  She had recently completed therapy for Strep throat.  Concern was that she developed Guillain Barre syndrome, and she was started on IVIG 1/30.  She progressed to respiratory failure on 2/01 and required intubation.     SUBJECTIVE:  No events overnight, no acute changes, trach in place 2/9.  VITAL SIGNS: BP (!) 164/100   Pulse (!) 110   Temp 98.2 F (36.8 C) (Oral)   Resp (!) 21   Ht 5\' 2"  (1.575 m)   Wt 72.6 kg (160 lb 0.9 oz)   SpO2 96%   BMI 29.27 kg/m   INTAKE / OUTPUT: I/O last 3 completed shifts: In: 2852.3 [I.V.:1047; NG/GT:955.3; IV Piggyback:850] Out: 3150 [Urine:3150]  General: Acutely ill appearing female, extremely weak Neuro: Diffusely weak, eyes open and unable to close Eyes: Pupils reactive, eyes open, unable to close with lacrilube in place Cardiac: RRR, Nl S1/S2, -M/R/G. Chest: Coarse BS diffusely Abd: Soft, NT, ND and +BS Ext: 1+ edema and -tenderness Skin no rashes  LABS:  BMET  Recent Labs Lab 01/23/17 0334 01/23/17 0943 01/24/17 0441  NA 135 136 138  K 3.5 3.7 3.1*  CL 97* 97* 100*  CO2 27 27 26   BUN 34* 34* 24*  CREATININE 0.83 0.85 0.71  GLUCOSE 122* 112* 145*   Electrolytes  Recent Labs Lab 01/22/17 0257 01/23/17 0334 01/23/17 0943 01/24/17 0441  CALCIUM 8.9 9.0 9.1 8.5*  MG 2.1 2.0  --  2.1  PHOS 4.2 4.5 4.5 3.7   CBC  Recent Labs Lab 01/22/17 0257 01/23/17 0334 01/24/17 0438  WBC 15.4* 16.6* 14.1*  HGB 9.5* 9.9* 9.7*  HCT 29.5* 30.6* 30.1*  PLT 357 376 386   Coag's  Recent Labs Lab 01/23/17 0334  APTT 35  INR 1.05   Sepsis Markers No results for input(s): LATICACIDVEN, PROCALCITON, O2SATVEN in  the last 168 hours. ABG  Recent Labs Lab 01/18/17 0420 01/21/17 0242 01/22/17 0320  PHART 7.409 7.476* 7.473*  PCO2ART 37.8 41.8 42.5  PO2ART 105 83.2 82.2*   Liver Enzymes  Recent Labs Lab 01/19/17 0345  01/22/17 0257 01/23/17 0943 01/24/17 0441  AST 245*  --   --   --   --   ALT 191*  --   --   --   --   ALKPHOS 435*  --   --   --   --   BILITOT 0.7  --   --   --   --   ALBUMIN 1.5*  < > 1.7* 1.9* 1.7*  < > = values in this interval not displayed. Cardiac Enzymes No results for input(s): TROPONINI, PROBNP in the last 168 hours.  Glucose  Recent Labs Lab 01/23/17 1148 01/23/17 1544 01/23/17 2003 01/24/17 0007 01/24/17 0356 01/24/17 0758  GLUCAP 145* 125* 165* 125* 142* 139*   Imaging Dg Chest Port 1 View  Result Date: 01/23/2017 CLINICAL DATA:  Status post tracheostomy. EXAM: PORTABLE CHEST 1 VIEW COMPARISON:  Film earlier today at 0430 hours FINDINGS: Tracheostomy present which appears in appropriate position. Right jugular central line shows stable positioning. Lungs show stable bilateral lower lobe consolidation, left greater than right. Stable probable small left pleural effusion.  No edema or pneumothorax. IMPRESSION: Appropriate radiographic appearance of tracheostomy. Stable bilateral lower lobe pulmonary airspace disease. Electronically Signed   By: Aletta Edouard M.D.   On: 01/23/2017 10:53   Dg Abd Portable 1v  Result Date: 01/23/2017 CLINICAL DATA:  Feeding tube placement EXAM: PORTABLE ABDOMEN - 1 VIEW COMPARISON:  01/15/2017 FINDINGS: Feeding tube tip is in the distal stomach. Nonobstructive bowel gas pattern. IMPRESSION: Feeding tube tip in the distal stomach. Electronically Signed   By: Rolm Baptise M.D.   On: 01/23/2017 11:45   SIGNIFICANT EVENTS  IVIG 1/30, 1/31, 2/1, 2/2  STUDIES:  MR brain 1/29>>>negative LP 1/30>>>glucose 55, RBC 6, WBC 3, protein 79  CULTURES: CSF 1/30>>>negative BC x 2 2/1>>> negative Respiratory culture 2/3 >>> rare  gpc pairs>>>normal flora  ANTIBIOTICS: Diflucan 1/31 >>2/8 Unasyn 2/1>>>2/3 vanc 2/3>>>2/10 Zosyn 2/3>>>2/10  LINES/TUBES: ETT 2/1>>>2/9 Trach [employer]) 2/9>>> Right IJ CVL 2/1>>>  ASSESSMENT / PLAN:  PULMONARY Acute respiratory failure - in setting neuromuscular weakness presumed r/t guillane barre.   ?aspiration  P:   Proceed with weaning as able, doubt will be able to anytime soon Trach in place Titrate O2 for sat of 88-92%  NEUROLOGIC Guillain barre syndrome  Sedation needs on vent  A-fib with RVR P:   RASS goal: -1 Fentanyl gtt  PRN versed Neuro following  D/C propofol IVIg per neuro - complete, ?EMG study for prognostication  CARDIOVASCULAR Hx HTN  P:  Hold home losartan  D/C Levophed Cardizem for HR control Hydralazine PRN  RENAL Significant hyponatremia - new onset.  Low serum osm, high urine osm.  Likely r/t IVIG due to hyperproteinemia.    Continues to drop Na even with 3% P:   F/u chem  KVO IVF D/Ced 3% saline Hold further lasix for now Replace electrolytes as indicated Replace K today  GASTROINTESTINAL Dysphagia  P:   NPO  PPI  TF  Will need PEG tube - IR consulted  HEMATOLOGIC Leukocytosis  P:  Monitor fever curve  SQ Heparin  F/u cbc  Transfuse per ICU protocol  INFECTIOUS ? Aspiration  Leukocytosis  P:   Vanc/zosyn day 8, d/c after 2/10 dose D/Ced Diflucan for thrush  ENDOCRINE No active issue  P:   Monitor glucose on chem   FAMILY  - Updates:  Husband updated at length at bedside 2/10, trach today, g-tube per IR  - Inter-disciplinary family meet or Palliative Care meeting due by: 2/8  The patient is critically ill with multiple organ systems failure and requires high complexity decision making for assessment and support, frequent evaluation and titration of therapies, application of advanced monitoring technologies and extensive interpretation of multiple databases.   Critical Care Time devoted to patient care  services described in this note is  37  Minutes. This time reflects time of care of this signee Dr Jennet Maduro. This critical care time does not reflect procedure time, or teaching time or supervisory time of PA/NP/Med student/Med Resident etc but could involve care discussion time.  Rush Farmer, M.D. [facility] Pulmonary/Critical Care Medicine. Pager: [phone]. After hours pager: [phone].

## 2017-01-24 NOTE — Progress Notes (Signed)
Unable to perform NIF and VC due to pt's mental status and the inability to initiate any breaths on her own.

## 2017-01-25 DIAGNOSIS — I959 Hypotension, unspecified: Secondary | ICD-10-CM | POA: Diagnosis not present

## 2017-01-25 DIAGNOSIS — E871 Hypo-osmolality and hyponatremia: Secondary | ICD-10-CM | POA: Diagnosis not present

## 2017-01-25 DIAGNOSIS — G610 Guillain-Barre syndrome: Secondary | ICD-10-CM | POA: Diagnosis not present

## 2017-01-25 DIAGNOSIS — I480 Paroxysmal atrial fibrillation: Secondary | ICD-10-CM | POA: Diagnosis not present

## 2017-01-25 DIAGNOSIS — E876 Hypokalemia: Secondary | ICD-10-CM | POA: Diagnosis not present

## 2017-01-25 DIAGNOSIS — B370 Candidal stomatitis: Secondary | ICD-10-CM | POA: Diagnosis not present

## 2017-01-25 DIAGNOSIS — R531 Weakness: Secondary | ICD-10-CM | POA: Diagnosis not present

## 2017-01-25 DIAGNOSIS — E870 Hyperosmolality and hypernatremia: Secondary | ICD-10-CM | POA: Diagnosis not present

## 2017-01-25 DIAGNOSIS — G8250 Quadriplegia, unspecified: Secondary | ICD-10-CM | POA: Diagnosis not present

## 2017-01-25 DIAGNOSIS — J9600 Acute respiratory failure, unspecified whether with hypoxia or hypercapnia: Secondary | ICD-10-CM | POA: Diagnosis not present

## 2017-01-25 DIAGNOSIS — J690 Pneumonitis due to inhalation of food and vomit: Secondary | ICD-10-CM | POA: Diagnosis not present

## 2017-01-25 DIAGNOSIS — I4892 Unspecified atrial flutter: Secondary | ICD-10-CM | POA: Diagnosis not present

## 2017-01-25 DIAGNOSIS — J9601 Acute respiratory failure with hypoxia: Secondary | ICD-10-CM | POA: Diagnosis not present

## 2017-01-25 LAB — PHOSPHORUS: PHOSPHORUS: 3.5 mg/dL (ref 2.5–4.6)

## 2017-01-25 LAB — BASIC METABOLIC PANEL
Anion gap: 8 (ref 5–15)
Anion gap: 9 (ref 5–15)
BUN: 25 mg/dL — ABNORMAL HIGH (ref 6–20)
BUN: 29 mg/dL — ABNORMAL HIGH (ref 6–20)
CALCIUM: 8.9 mg/dL (ref 8.9–10.3)
CHLORIDE: 107 mmol/L (ref 101–111)
CO2: 27 mmol/L (ref 22–32)
CO2: 27 mmol/L (ref 22–32)
CREATININE: 0.74 mg/dL (ref 0.44–1.00)
Calcium: 9 mg/dL (ref 8.9–10.3)
Chloride: 105 mmol/L (ref 101–111)
Creatinine, Ser: 0.79 mg/dL (ref 0.44–1.00)
GFR calc non Af Amer: >60 mL/min (ref >60)
Glucose, Bld: 142 mg/dL — ABNORMAL HIGH (ref 65–99)
Glucose, Bld: 149 mg/dL — ABNORMAL HIGH (ref 65–99)
POTASSIUM: 4 mmol/L (ref 3.5–5.1)
Potassium: 3.5 mmol/L (ref 3.5–5.1)
SODIUM: 140 mmol/L (ref 135–145)
SODIUM: 143 mmol/L (ref 135–145)

## 2017-01-25 LAB — GLUCOSE, CAPILLARY
GLUCOSE-CAPILLARY: 189 mg/dL — AB (ref 65–99)
GLUCOSE-CAPILLARY: 114 mg/dL — AB (ref 65–99)
Glucose-Capillary: 113 mg/dL — ABNORMAL HIGH (ref 65–99)
Glucose-Capillary: 121 mg/dL — ABNORMAL HIGH (ref 65–99)
Glucose-Capillary: 127 mg/dL — ABNORMAL HIGH (ref 65–99)
Glucose-Capillary: 133 mg/dL — ABNORMAL HIGH (ref 65–99)
Glucose-Capillary: 144 mg/dL — ABNORMAL HIGH (ref 65–99)

## 2017-01-25 LAB — MAGNESIUM
MAGNESIUM: 1.9 mg/dL (ref 1.7–2.4)
MAGNESIUM: 2.6 mg/dL — AB (ref 1.7–2.4)

## 2017-01-25 LAB — CBC
HCT: 28.1 % — ABNORMAL LOW (ref 36.0–46.0)
Hemoglobin: 8.9 g/dL — ABNORMAL LOW (ref 12.0–15.0)
MCH: 26.1 pg (ref 26.0–34.0)
MCHC: 31.7 g/dL (ref 30.0–36.0)
MCV: 82.4 fL (ref 78.0–100.0)
PLATELETS: 355 10*3/uL (ref 150–400)
RBC: 3.41 MIL/uL — ABNORMAL LOW (ref 3.87–5.11)
RDW: 15.4 % (ref 11.5–15.5)
WBC: 15.1 10*3/uL — ABNORMAL HIGH (ref 4.0–10.5)

## 2017-01-25 LAB — HEPATIC FUNCTION PANEL
ALK PHOS: 180 U/L — AB (ref 38–126)
ALT: 89 U/L — AB (ref 14–54)
AST: 65 U/L — ABNORMAL HIGH (ref 15–41)
Albumin: 1.8 g/dL — ABNORMAL LOW (ref 3.5–5.0)
BILIRUBIN TOTAL: 0.3 mg/dL (ref 0.3–1.2)
Total Protein: 7.2 g/dL (ref 6.5–8.1)

## 2017-01-25 LAB — TRIGLYCERIDES: Triglycerides: 132 mg/dL (ref <150)

## 2017-01-25 MED ORDER — POTASSIUM CHLORIDE 20 MEQ/15ML (10%) PO SOLN
40.0000 meq | Freq: Three times a day (TID) | ORAL | Status: AC
  Administered 2017-01-25 (×2): 40 meq
  Filled 2017-01-25 (×2): qty 30

## 2017-01-25 MED ORDER — PROPOFOL 1000 MG/100ML IV EMUL
5.0000 ug/kg/min | INTRAVENOUS | Status: DC
  Administered 2017-01-25: 5 ug/kg/min via INTRAVENOUS
  Administered 2017-01-26: 15 ug/kg/min via INTRAVENOUS
  Filled 2017-01-25 (×2): qty 100

## 2017-01-25 MED ORDER — FENTANYL 50 MCG/HR TD PT72
100.0000 ug | MEDICATED_PATCH | TRANSDERMAL | Status: DC
  Administered 2017-01-25: 100 ug via TRANSDERMAL
  Filled 2017-01-25 (×2): qty 2

## 2017-01-25 MED ORDER — ACETAMINOPHEN 160 MG/5ML PO SOLN
650.0000 mg | Freq: Four times a day (QID) | ORAL | Status: DC | PRN
  Administered 2017-01-25: 650 mg via ORAL
  Filled 2017-01-25: qty 20.3

## 2017-01-25 MED ORDER — FUROSEMIDE 10 MG/ML IJ SOLN
40.0000 mg | Freq: Once | INTRAMUSCULAR | Status: AC
  Administered 2017-01-25: 40 mg via INTRAVENOUS
  Filled 2017-01-25: qty 4

## 2017-01-25 MED ORDER — MAGNESIUM SULFATE 2 GM/50ML IV SOLN
2.0000 g | Freq: Once | INTRAVENOUS | Status: AC
  Administered 2017-01-25: 2 g via INTRAVENOUS
  Filled 2017-01-25: qty 50

## 2017-01-25 NOTE — Progress Notes (Signed)
eLink Physician-Brief Progress Note Patient Name: Joanne Morrison DOB: 01/09/1951 MRN: [id number]   Date of Service  01/25/2017  HPI/Events of Note  Patient having intermittent periods of atrial flutter. No rapid ventricular response. Currently in normal sinus rhythm on my review of telemetry. Potassium 3.5 & magnesium 1.9 this morning. Patient reportedly had both magnesium and potassium administered.   eICU Interventions  1. Checking stat BMP & magnesium  2. Continue telemetry monitoring      Intervention Category Major Interventions: Arrhythmia - evaluation and management  Tera Partridge 01/25/2017, 4:42 PM

## 2017-01-25 NOTE — Progress Notes (Signed)
Name: Joanne Morrison MRN: [id number] DOB: Jan 27, 1951    ADMISSION DATE:  01/12/2017 CONSULTATION DATE:  01/15/2017  REFERRING MD :  Sloan Leiter ([facility])   CHIEF COMPLAINT:  Dyspnea, potential for respiratory compromise   BRIEF PATIENT DESCRIPTION:  66 yo female presented with weakness and myalgias for 3 to 4 days.  She had recently completed therapy for Strep throat.  Concern was that she developed Guillain Barre syndrome, and she was started on IVIG 1/30.  She progressed to respiratory failure on 2/01 and required intubation.     SUBJECTIVE:  Failed weaning yesterday, no events overnight, mild painful distress  VITAL SIGNS: BP 133/73   Pulse 82   Temp 99.6 F (37.6 C) (Oral)   Resp 20   Ht 5\' 2"  (1.575 m)   Wt 72.9 kg (160 lb 11.5 oz)   SpO2 98%   BMI 29.40 kg/m   INTAKE / OUTPUT: I/O last 3 completed shifts: In: 3611.9 [I.V.:1051.9; NG/GT:1760; IV Piggyback:800] Out: 2910 [Urine:2735; Stool:175]  General: Acutely ill appearing female, NAD on full vent support Neuro: Diffusely weak but improving interestingly enough, able to blink this AM Eyes: Pupils reactive, eyes less open, with lacrilube in place Cardiac: RRR, Nl S1/S2, -M/R/G. Chest: Coarse BS bilaterally Abd: Soft, NT, ND and +BS Ext: 1+ edema and -tenderness Skin: no rashes  LABS:  BMET  Recent Labs Lab 01/23/17 0943 01/24/17 0441 01/25/17 0422  NA 136 138 140  K 3.7 3.1* 3.5  CL 97* 100* 105  CO2 27 26 27   BUN 34* 24* 25*  CREATININE 0.85 0.71 0.74  GLUCOSE 112* 145* 142*   Electrolytes  Recent Labs Lab 01/23/17 0334 01/23/17 0943 01/24/17 0441 01/25/17 0422  CALCIUM 9.0 9.1 8.5* 8.9  MG 2.0  --  2.1 1.9  PHOS 4.5 4.5 3.7 3.5   CBC  Recent Labs Lab 01/23/17 0334 01/24/17 0438 01/25/17 0422  WBC 16.6* 14.1* 15.1*  HGB 9.9* 9.7* 8.9*  HCT 30.6* 30.1* 28.1*  PLT 376 386 355   Coag's  Recent Labs Lab 01/23/17 0334  APTT 35  INR 1.05   Sepsis Markers No results for input(s):  LATICACIDVEN, PROCALCITON, O2SATVEN in the last 168 hours. ABG  Recent Labs Lab 01/21/17 0242 01/22/17 0320  PHART 7.476* 7.473*  PCO2ART 41.8 42.5  PO2ART 83.2 82.2*   Liver Enzymes  Recent Labs Lab 01/19/17 0345  01/23/17 0943 01/24/17 0441 01/25/17 0422  AST 245*  --   --   --  65*  ALT 191*  --   --   --  89*  ALKPHOS 435*  --   --   --  180*  BILITOT 0.7  --   --   --  0.3  ALBUMIN 1.5*  < > 1.9* 1.7* 1.8*  < > = values in this interval not displayed. Cardiac Enzymes No results for input(s): TROPONINI, PROBNP in the last 168 hours.  Glucose  Recent Labs Lab 01/24/17 1146 01/24/17 1624 01/24/17 2006 01/25/17 0008 01/25/17 0419 01/25/17 0758  GLUCAP 161* 136* 155* 121* 144* 113*   Imaging No results found. SIGNIFICANT EVENTS  IVIG 1/30, 1/31, 2/1, 2/2  STUDIES:  MR brain 1/29>>>negative LP 1/30>>>glucose 55, RBC 6, WBC 3, protein 79  CULTURES: CSF 1/30>>>negative BC x 2 2/1>>> negative Respiratory culture 2/3 >>> rare gpc pairs>>>normal flora  ANTIBIOTICS: Diflucan 1/31 >>2/8 Unasyn 2/1>>>2/3 vanc 2/3>>>2/10 Zosyn 2/3>>>2/10  LINES/TUBES: ETT 2/1>>>2/9 Trach [employer]) 2/9>>> Right IJ CVL 2/1>>>  ASSESSMENT / PLAN:  PULMONARY Acute  respiratory failure - in setting neuromuscular weakness presumed r/t guillane barre.   ?aspiration  P:   Proceed with weaning as able, doubt will be able to anytime soon Trach in place Titrate O2 for sat of 88-92% Single dose of lasix today for fluid overload  NEUROLOGIC Guillain barre syndrome  Sedation needs on vent  A-fib with RVR P:   RASS goal: -1 Fentanyl gtt  Add fentanyl patch 100 mcg to decrease IV drip need PRN versed Neuro following  D/Ced propofol IVIg per neuro - complete, ?EMG study for prognostication  CARDIOVASCULAR Hx HTN  P:  Hold home losartan  D/C Levophed Cardizem for HR control Hydralazine PRN  RENAL Significant hyponatremia - new onset.  Low serum osm, high urine osm.   Likely r/t IVIG due to hyperproteinemia.    Continues to drop Na even with 3% P:   F/u chem  KVO IVF D/Ced 3% saline Lasix 40 mg IV x1 Replace electrolytes as indicated Replace K and Mg today  GASTROINTESTINAL Dysphagia  P:   NPO  PPI  TF  Will need PEG tube - IR consulted, likely Monday   HEMATOLOGIC Leukocytosis  P:  Monitor fever curve  SQ Heparin  F/u cbc  Transfuse per ICU protocol  INFECTIOUS ? Aspiration  Leukocytosis  P:   Vanc/zosyn day 8, d/c after 2/10 dose D/Ced Diflucan for thrush  ENDOCRINE No active issue  P:   Monitor glucose on chem   FAMILY  - Updates:  Husband updated at length at bedside 2/11, IR to insert G-tube.  - Inter-disciplinary family meet or Palliative Care meeting due by: 2/8  The patient is critically ill with multiple organ systems failure and requires high complexity decision making for assessment and support, frequent evaluation and titration of therapies, application of advanced monitoring technologies and extensive interpretation of multiple databases.   Critical Care Time devoted to patient care services described in this note is  37  Minutes. This time reflects time of care of this signee Dr Jennet Maduro. This critical care time does not reflect procedure time, or teaching time or supervisory time of PA/NP/Med student/Med Resident etc but could involve care discussion time.  Rush Farmer, M.D. [facility] Pulmonary/Critical Care Medicine. Pager: [phone]. After hours pager: [phone].

## 2017-01-25 NOTE — Progress Notes (Signed)
Patient's HR sustaining in 120-130's.  Patient appears restless; Patient shakes head "yes" when asked if she is anxious.  Fentanyl drip increased and prn Versed given.  EKG obtained to confirm sinus rhythm.  Will continue to monitor. Powell, Ardeth Sportsman

## 2017-01-25 NOTE — Progress Notes (Signed)
Subjective: Pt is now s/p tracheostomy tube and doing well. Husband reported that she is now able to blink eyes bilatearly and able to lift arms at times. She still doesn't move her legs very much but did make good effort on Friday with PT>  Exam:     Vitals:   01/19/17 0800 01/19/17 0805  BP: (!) 178/101   Pulse: 89   Resp: 20   Temp:  98.1 F (36.7 C)   Gen: In bed, NAD Resp: non-labored breathing, no acute distress Abd: soft, nt  Neuro: MS: More awake and alert in subsequent exam. Following simple commands. Cn: Moving eyes better today in both directions. Very briefly blinked her eyes. Left lower motor neuron pattern facial droop noted. Motor: Quadriparesis with some preservation of distal muscles. RUE Deltoid 3/5, tricep 2/5, bicep 2/5, We, 3/5, Wf, 2/5. RLE proximal 1/5 and only able to wiggle toes, LUE Deltoid 3/5, bicep 2/5, tricep 1/5, WE 2/5 and WF 2/5.  DTR: Absent reflexes throughout.  Impression: 66 years old female presented with quadroparesis and left facial palsy in the setting of normal neuro axis images. She has improved somewhat with 7 days of IVIG. Awaiting GQ1B antibodies. She is now continuosuly improving therefore will not pursue PLEX. Mainstay of treatment at this point is rehabilitation.  Recommendations: -Follow up GQ1B antibodies -If any further worsening will recommend PLEX for 5 sessions.  Neurology to follow from distance. Please call with questions.  Neurology will continue to follow along. Please call with questions.

## 2017-01-26 ENCOUNTER — Inpatient Hospital Stay (HOSPITAL_COMMUNITY): Payer: Medicare Other

## 2017-01-26 ENCOUNTER — Encounter (HOSPITAL_COMMUNITY): Payer: Self-pay | Admitting: Interventional Radiology

## 2017-01-26 DIAGNOSIS — I480 Paroxysmal atrial fibrillation: Secondary | ICD-10-CM | POA: Diagnosis not present

## 2017-01-26 DIAGNOSIS — B370 Candidal stomatitis: Secondary | ICD-10-CM | POA: Diagnosis not present

## 2017-01-26 DIAGNOSIS — E871 Hypo-osmolality and hyponatremia: Secondary | ICD-10-CM | POA: Diagnosis not present

## 2017-01-26 DIAGNOSIS — J690 Pneumonitis due to inhalation of food and vomit: Secondary | ICD-10-CM | POA: Diagnosis not present

## 2017-01-26 DIAGNOSIS — R29898 Other symptoms and signs involving the musculoskeletal system: Secondary | ICD-10-CM | POA: Diagnosis not present

## 2017-01-26 DIAGNOSIS — R531 Weakness: Secondary | ICD-10-CM

## 2017-01-26 DIAGNOSIS — E870 Hyperosmolality and hypernatremia: Secondary | ICD-10-CM | POA: Diagnosis not present

## 2017-01-26 DIAGNOSIS — G8250 Quadriplegia, unspecified: Secondary | ICD-10-CM | POA: Diagnosis not present

## 2017-01-26 DIAGNOSIS — G610 Guillain-Barre syndrome: Secondary | ICD-10-CM | POA: Diagnosis not present

## 2017-01-26 DIAGNOSIS — J9601 Acute respiratory failure with hypoxia: Secondary | ICD-10-CM | POA: Diagnosis not present

## 2017-01-26 DIAGNOSIS — I4892 Unspecified atrial flutter: Secondary | ICD-10-CM | POA: Diagnosis not present

## 2017-01-26 DIAGNOSIS — I959 Hypotension, unspecified: Secondary | ICD-10-CM | POA: Diagnosis not present

## 2017-01-26 HISTORY — PX: IR GENERIC HISTORICAL: IMG1180011

## 2017-01-26 LAB — MAGNESIUM: Magnesium: 2.3 mg/dL (ref 1.7–2.4)

## 2017-01-26 LAB — GLUCOSE, CAPILLARY
GLUCOSE-CAPILLARY: 104 mg/dL — AB (ref 65–99)
GLUCOSE-CAPILLARY: 128 mg/dL — AB (ref 65–99)
GLUCOSE-CAPILLARY: 115 mg/dL — AB (ref 65–99)
Glucose-Capillary: 134 mg/dL — ABNORMAL HIGH (ref 65–99)
Glucose-Capillary: 137 mg/dL — ABNORMAL HIGH (ref 65–99)
Glucose-Capillary: 94 mg/dL (ref 65–99)

## 2017-01-26 LAB — CBC
HCT: 28.2 % — ABNORMAL LOW (ref 36.0–46.0)
HEMOGLOBIN: 8.9 g/dL — AB (ref 12.0–15.0)
MCH: 26.3 pg (ref 26.0–34.0)
MCHC: 31.6 g/dL (ref 30.0–36.0)
MCV: 83.4 fL (ref 78.0–100.0)
PLATELETS: 328 10*3/uL (ref 150–400)
RBC: 3.38 MIL/uL — AB (ref 3.87–5.11)
RDW: 16 % — ABNORMAL HIGH (ref 11.5–15.5)
WBC: 13.6 10*3/uL — AB (ref 4.0–10.5)

## 2017-01-26 LAB — RENAL FUNCTION PANEL
ANION GAP: 8 (ref 5–15)
Albumin: 2 g/dL — ABNORMAL LOW (ref 3.5–5.0)
BUN: 33 mg/dL — ABNORMAL HIGH (ref 6–20)
CHLORIDE: 110 mmol/L (ref 101–111)
CO2: 26 mmol/L (ref 22–32)
CREATININE: 0.7 mg/dL (ref 0.44–1.00)
Calcium: 9.2 mg/dL (ref 8.9–10.3)
Glucose, Bld: 102 mg/dL — ABNORMAL HIGH (ref 65–99)
POTASSIUM: 3.4 mmol/L — AB (ref 3.5–5.1)
Phosphorus: 3.2 mg/dL (ref 2.5–4.6)
Sodium: 144 mmol/L (ref 135–145)

## 2017-01-26 LAB — TROPONIN I: Troponin I: <0.03 ng/mL (ref <0.03)

## 2017-01-26 MED ORDER — MIDAZOLAM HCL 2 MG/2ML IJ SOLN
INTRAMUSCULAR | Status: AC | PRN
  Administered 2017-01-26: 1 mg via INTRAVENOUS

## 2017-01-26 MED ORDER — FENTANYL CITRATE (PF) 100 MCG/2ML IJ SOLN
INTRAMUSCULAR | Status: AC
  Filled 2017-01-26: qty 2

## 2017-01-26 MED ORDER — LIDOCAINE-EPINEPHRINE (PF) 1 %-1:200000 IJ SOLN
INTRAMUSCULAR | Status: AC
  Filled 2017-01-26: qty 30

## 2017-01-26 MED ORDER — FUROSEMIDE 10 MG/ML IJ SOLN
40.0000 mg | Freq: Once | INTRAMUSCULAR | Status: AC
  Administered 2017-01-26: 40 mg via INTRAVENOUS
  Filled 2017-01-26: qty 4

## 2017-01-26 MED ORDER — LIDOCAINE-EPINEPHRINE 2 %-1:100000 IJ SOLN
INTRAMUSCULAR | Status: AC | PRN
  Administered 2017-01-26: 10 mL

## 2017-01-26 MED ORDER — POTASSIUM CHLORIDE 20 MEQ/15ML (10%) PO SOLN
40.0000 meq | Freq: Once | ORAL | Status: AC
  Administered 2017-01-26: 40 meq
  Filled 2017-01-26: qty 30

## 2017-01-26 MED ORDER — IOPAMIDOL (ISOVUE-300) INJECTION 61%
INTRAVENOUS | Status: AC
  Administered 2017-01-26: 40 mL
  Filled 2017-01-26: qty 50

## 2017-01-26 MED ORDER — GLUCAGON HCL RDNA (DIAGNOSTIC) 1 MG IJ SOLR
INTRAMUSCULAR | Status: AC | PRN
  Administered 2017-01-26: 1 mg via INTRAVENOUS

## 2017-01-26 MED ORDER — WHITE PETROLATUM GEL
Status: AC
  Administered 2017-01-26: 15:00:00
  Filled 2017-01-26: qty 1

## 2017-01-26 MED ORDER — CEFAZOLIN SODIUM-DEXTROSE 2-4 GM/100ML-% IV SOLN
2.0000 g | Freq: Once | INTRAVENOUS | Status: AC
  Administered 2017-01-26: 2 g via INTRAVENOUS

## 2017-01-26 MED ORDER — CEFAZOLIN SODIUM-DEXTROSE 2-4 GM/100ML-% IV SOLN
INTRAVENOUS | Status: AC
  Administered 2017-01-26: 2 g via INTRAVENOUS
  Filled 2017-01-26: qty 100

## 2017-01-26 MED ORDER — GLUCAGON HCL RDNA (DIAGNOSTIC) 1 MG IJ SOLR
INTRAMUSCULAR | Status: AC
  Filled 2017-01-26: qty 1

## 2017-01-26 MED ORDER — FENTANYL CITRATE (PF) 100 MCG/2ML IJ SOLN
100.0000 ug | INTRAMUSCULAR | Status: DC | PRN
  Administered 2017-01-26 – 2017-01-31 (×7): 100 ug via INTRAVENOUS
  Filled 2017-01-26 (×8): qty 2

## 2017-01-26 MED ORDER — MIDAZOLAM HCL 2 MG/2ML IJ SOLN
INTRAMUSCULAR | Status: AC
  Filled 2017-01-26: qty 2

## 2017-01-26 MED ORDER — ERYTHROMYCIN 5 MG/GM OP OINT
TOPICAL_OINTMENT | Freq: Four times a day (QID) | OPHTHALMIC | Status: DC
  Administered 2017-01-26: 09:00:00 via OPHTHALMIC
  Filled 2017-01-26: qty 3.5

## 2017-01-26 MED ORDER — ERYTHROMYCIN 5 MG/GM OP OINT
1.0000 "application " | TOPICAL_OINTMENT | OPHTHALMIC | Status: DC
  Administered 2017-01-26 – 2017-02-04 (×52): 1 via OPHTHALMIC
  Filled 2017-01-26 (×3): qty 3.5

## 2017-01-26 MED ORDER — FENTANYL CITRATE (PF) 100 MCG/2ML IJ SOLN
INTRAMUSCULAR | Status: AC | PRN
  Administered 2017-01-26: 50 ug via INTRAVENOUS

## 2017-01-26 NOTE — Progress Notes (Signed)
OT Cancellation Note  Patient Details Name: Joanne Morrison MRN: [id number] DOB: 12/15/1951   Cancelled Treatment:    Reason Eval/Treat Not Completed: Patient at procedure or test/ unavailable. Pt currently in IR. Will reattempt at schedule allows.  Malka So 01/26/2017, 1:20 PM  [phone]

## 2017-01-26 NOTE — Progress Notes (Signed)
Physical Therapy Treatment Patient Details Name: Joanne Morrison MRN: [id number] DOB: [birth year] Today's Date: 02/07/2017    History of Present Illness Patient is a 66 y.o. female with medical history significant of chronic low back pain, anxiety, depression, hypertension-recently had sorethroat (+strep) presented to the ED on 1/29 with lower ext weakness,tingling numbess that started in the lower extremities and subsequently started involving her upper extremities. Unable to elicit DTR in the lower ext. MRI of the entire spine negative. Concern for GBS-started on empiric IVIG by neurology.Developed respiratory failure and was intubated 2/1 and trasnferred to ICU.Marland Kitchen Trach 01/23/17.    PT Comments    Pt with much improved lower extremity strength since Friday. Will work toward [place] next session.  Follow Up Recommendations  CIR (if pt able to wean from vent)     Equipment Recommendations  Other (comment) (to be assessed )    Recommendations for Other Services       Precautions / Restrictions Precautions Precautions: Fall Precaution Comments: trach/vent; monitor BP Restrictions Weight Bearing Restrictions: No    Mobility  Bed Mobility                  Transfers                    Ambulation/Gait                 Stairs            Wheelchair Mobility    Modified Rankin (Stroke Patients Only)       Balance                                    Cognition Arousal/Alertness: Awake/alert Behavior During Therapy: WFL for tasks assessed/performed Overall Cognitive Status: Difficult to assess                 General Comments: following 1 step commands consistently    Exercises General Exercises - Lower Extremity Ankle Circles/Pumps: Supine;AAROM;10 reps;Both Short Arc Quad: AAROM;10 reps;Both;Supine Heel Slides: Both;5 reps;Supine;AAROM Hip ABduction/ADduction: AAROM;Both;10 reps;Supine Straight Leg Raises:  (rt to only 30  degrees, lt to 40 degrees)    General Comments        Pertinent Vitals/Pain Pain Assessment: Faces Faces Pain Scale: No hurt    Home Living                      Prior Function            PT Goals (current goals can now be found in the care plan section) Progress towards PT goals: Progressing toward goals    Frequency    Min 3X/week      PT Plan Discharge plan needs to be updated    Co-evaluation             End of Session Equipment Utilized During Treatment: Oxygen (vent) Activity Tolerance: Patient tolerated treatment well Patient left: in bed;with call bell/phone within reach;with family/visitor present     Time: [id number] PT Time Calculation (min) (ACUTE ONLY): 18 min  Charges:  $Therapeutic Activity: 8-22 mins                    G CodesShary Decamp [facility] 02-07-2017, 1:18 PM [employer] PT [phone]

## 2017-01-26 NOTE — Procedures (Signed)
Successful fluoroscopic guided insertion of gastrostomy tube.   The gastrostomy tube may be used immediately for medications.   Tube feeds may be initiated in 24 hours as per the primary team.   EBL: Minimal No immediate post procedural complications.   Jay Renai Lopata, MD Pager #: [phone]    

## 2017-01-26 NOTE — Sedation Documentation (Addendum)
During procedure patient went into a bradycardic rhythm for 15 seconds and came back up on own. MD in room and aware. Strip printed and placed in chart. Report given to ICU nurse regarding episode. BP and O2 sat WDL.   Rhythm was 2nd degree heart block, Dr Laurena Spies notified since Cards signed off.    Darrel Reach, RN

## 2017-01-26 NOTE — Consult Note (Signed)
Reason for consult:  Red eyes  HPI: Joanne Morrison is an 66 y.o. female.  Per her husband, she has been in the hospital for about 2 weeks with Raynald Blend.  He reports that her eyes have been open for days.  She has been getting Clear Eyes drops and Lacrilube.   The eyes have become progressively redder.  +Mucus discharge.  POH:  Refractive error  FOH:  Negative per husband  Past Medical History:  Diagnosis Date  . Anxiety   . Arthritis    back  . Chronic cystitis   . Chronic low back pain   . Depression   . Diverticulosis of colon   . Hemorrhoids   . History of colon polyps    09-24-2009  rectal hyperplastic polyp/   and 2016 non-cancerous  . History of endometriosis   . History of panic attacks   . Hyperlipidemia   . Osteopenia   . Pelvic pain   . Sensation of pressure in bladder area   . Wears glasses    Past Surgical History:  Procedure Laterality Date  . BUNIONECTOMY Left 2004 approx  . CARPAL TUNNEL RELEASE Bilateral right 2014;  left 2007  . COLONOSCOPY W/ POLYPECTOMY  09/24/2009  . CYSTO WITH HYDRODISTENSION [name] 10/15/2016   Procedure: CYSTOSCOPY/HYDRODISTENSION AND MARCAINE INSTILLATION;  Surgeon: Rana Snare, MD;  Location: [facility];  Service: Urology;  Laterality: [name];  . CYSTO/  HYDRODISTENTION/  INSTILLATION THERAPY  1997 approx  . HYSTEROSCOPY W/D&C  11/01/2004   POLYPECTOMY  . LAPAROSCOPIC VAGINAL HYSTERECTOMY WITH SALPINGO OOPHORECTOMY Bilateral 04/20/2012  . TUBAL LIGATION  yrs ago   Family History  Problem Relation Age of Onset  . Hypertension Mother   . Dementia Mother   . Lung cancer Mother     smoker  . Osteoporosis Mother   . Heart attack Father 25  . Hypertension Brother   . Diabetes Maternal Aunt   . Cancer Maternal Uncle     RENAL  . Heart attack Maternal Grandmother 86  . Lung cancer Maternal Grandfather     Ecologist  . Pulmonary embolism Brother     ? etiology  . Anesthesia problems Neg Hx   . Stroke Neg Hx     Current Facility-Administered Medications  Medication Dose Route Frequency Provider Last Rate Last Dose  . acetaminophen (TYLENOL) solution 650 mg  650 mg Oral Q6H PRN Rush Farmer, MD   650 mg at 01/25/17 1705  . albuterol (PROVENTIL) (2.5 MG/3ML) 0.083% nebulizer solution 2.5 mg  2.5 mg Nebulization Q2H PRN Shanker Kristeen Mans, MD      . chlorhexidine gluconate (MEDLINE KIT) (PERIDEX) 0.12 % solution 15 mL  15 mL Mouth Rinse BID Erick Colace, NP   15 mL at 01/26/17 0800  . docusate (COLACE) 50 MG/5ML liquid 100 mg  100 mg Oral BID PRN Rush Farmer, MD   100 mg at 01/24/17 0918  . erythromycin ophthalmic ointment   Both Eyes Q4H Luberta Mutter, MD      . feeding supplement (PRO-STAT SUGAR FREE 64) liquid 30 mL  30 mL Per Tube BID Marijean Heath, NP   30 mL at 01/25/17 2133  . feeding supplement (VITAL AF 1.2 CAL) liquid 1,000 mL  1,000 mL Per Tube Q24H Rush Farmer, MD   Stopped at 01/26/17 0200  . fentaNYL (DURAGESIC - dosed mcg/hr) 100 mcg  100 mcg Transdermal Q72H Rush Farmer, MD   100 mcg at 01/25/17  1009  . fentaNYL (SUBLIMAZE) injection 100 mcg  100 mcg Intravenous Q2H PRN Raylene Miyamoto, MD      . Derrill Memo ON 01/27/2017] heparin injection 5,000 Units  5,000 Units Subcutaneous Q8H Monia Sabal, PA-C      . hydrALAZINE (APRESOLINE) injection 10 mg  10 mg Intravenous Q4H PRN Erick Colace, NP   10 mg at 01/25/17 1008  . insulin aspart (novoLOG) injection 0-15 Units  0-15 Units Subcutaneous Q4H Erick Colace, NP   2 Units at 01/26/17 0000  . labetalol (NORMODYNE,TRANDATE) injection 10 mg  10 mg Intravenous Q4H PRN Chesley Mires, MD   10 mg at 01/26/17 1111  . lidocaine (LIDODERM) 5 % 1 patch  1 patch Transdermal Q24H Jonetta Osgood, MD   1 patch at 01/25/17 1701  . MEDLINE mouth rinse  15 mL Mouth Rinse 10 times per day Erick Colace, NP   15 mL at 01/26/17 1000  . midazolam (VERSED) injection 1-4 mg  1-4 mg Intravenous Q2H PRN Rush Farmer, MD   4 mg at 01/25/17  2133  . ondansetron (ZOFRAN) tablet 4 mg  4 mg Oral Q6H PRN Shanker Kristeen Mans, MD       Or  . ondansetron [facility]) injection 4 mg  4 mg Intravenous Q6H PRN Jonetta Osgood, MD      . pantoprazole sodium (PROTONIX) 40 mg/20 mL oral suspension 40 mg  40 mg Per Tube Q1200 Erick Colace, NP   40 mg at 01/25/17 1146  . sodium chloride flush (NS) 0.9 % injection 10-40 mL  10-40 mL Intracatheter Q12H Rush Farmer, MD   30 mL at 01/25/17 2142  . sodium chloride flush (NS) 0.9 % injection 10-40 mL  10-40 mL Intracatheter PRN Rush Farmer, MD      . sodium chloride flush (NS) 0.9 % injection 3 mL  3 mL Intravenous Q12H Jonetta Osgood, MD   3 mL at 01/25/17 2141  . white petrolatum (VASELINE) gel            Allergies  Allergen Reactions  . Olanzapine Other (See Comments)    STIFF JOINTS, EXTREMITY WEAKNESS, MEMORY LOSS, LOSS OF BALANCE  . Sumatriptan Other (See Comments)    REACTION: altered mental status  . Graciella Freer D-S] Other (See Comments)    lethargic   Social History   Social History  . Marital status: Married    Spouse name: [name]  . Number of children: [name]  . Years of education: [name]   Occupational History  . Not on file.   Social History Main Topics  . Smoking status: Never Smoker  . Smokeless tobacco: Never Used  . Alcohol use No  . Drug use: No  . Sexual activity: Not on file   Other Topics Concern  . Not on file   Social History Narrative  . No narrative on file    Review of systems: ROS:  Patient is intubated and cannot respond easily.  She nods when asked if her eyes hurt.  Physical Exam:  Blood pressure (!) 159/121, pulse (!) 120, temperature 98.5 F (36.9 C), temperature source Oral, resp. rate 19, height _0  (1.575 m), weight 70.5 kg (155 lb 6.8 oz), SpO2 100 %.   VA:  unable  Pupils:   OD round, reactive to light, no APD            OS round, reactive to light, no APD  IOP deferred (soft to  palpation with lids closed OU)   CVF:  unable  Motility:  OD full ductions  OS full ductions  Balance/alignment:  Ortho by Luiz Ochoa   Bedside examination:                                 OD                                       External/adnexa: Normal                                      Lids/lashes:        +Lagophthalmos                                      Conjunctiva        Severe injection. Mucus in tear film       Cornea:              Small superior epi defect, large inferior epi defect -- both extend into conjunctiva.  Underlying corneal stroma is                             dull and slightly hazy               AC:                     Deep                               Iris:                     Normal        Lens:                  Clear                                       OS                                       External/adnexa: Normal                                      Lids/lashes:        +lagophthalmos                                      Conjunctiva        Severe injection.  Mucus in tear film       Cornea:              Small superior epi defect, large inferior epi defect -- both extend into conjunctiva.  Underlying corneal stroma is  Dull and slightly hazy                AC:                     Deep                               Iris:                     Normal        Lens:                  Clear        Labs/studies: Results for orders placed or performed during the hospital encounter of 01/12/17 (from the past 48 hour(s))  Glucose, capillary     Status: Abnormal   Collection Time: 01/24/17  4:24 PM  Result Value Ref Range   Glucose-Capillary 136 (H) 65 - 99 mg/dL  Triglycerides     Status: None   Collection Time: 01/24/17  5:16 PM  Result Value Ref Range   Triglycerides 75 <150 mg/dL  Glucose, capillary     Status: Abnormal   Collection Time: 01/24/17  8:06 PM  Result Value Ref Range   Glucose-Capillary 155 (H) 65 - 99 mg/dL  Glucose, capillary     Status: Abnormal    Collection Time: 01/25/17 12:08 AM  Result Value Ref Range   Glucose-Capillary 121 (H) 65 - 99 mg/dL  Glucose, capillary     Status: Abnormal   Collection Time: 01/25/17  4:19 AM  Result Value Ref Range   Glucose-Capillary 144 (H) 65 - 99 mg/dL  Hepatic function panel     Status: Abnormal   Collection Time: 01/25/17  4:22 AM  Result Value Ref Range   Total Protein 7.2 6.5 - 8.1 g/dL   Albumin 1.8 (L) 3.5 - 5.0 g/dL   AST 65 (H) 15 - 41 U/L   ALT 89 (H) 14 - 54 U/L   Alkaline Phosphatase 180 (H) 38 - 126 U/L   Total Bilirubin 0.3 0.3 - 1.2 mg/dL   Bilirubin, Direct <0.1 (L) 0.1 - 0.5 mg/dL   Indirect Bilirubin NOT CALCULATED 0.3 - 0.9 mg/dL  CBC     Status: Abnormal   Collection Time: 01/25/17  4:22 AM  Result Value Ref Range   WBC 15.1 (H) 4.0 - 10.5 K/uL   RBC 3.41 (L) 3.87 - 5.11 MIL/uL   Hemoglobin 8.9 (L) 12.0 - 15.0 g/dL   HCT 28.1 (L) 36.0 - 46.0 %   MCV 82.4 78.0 - 100.0 fL   MCH 26.1 26.0 - 34.0 pg   MCHC 31.7 30.0 - 36.0 g/dL   RDW 15.4 11.5 - 15.5 %   Platelets 355 150 - 400 K/uL  Basic metabolic panel     Status: Abnormal   Collection Time: 01/25/17  4:22 AM  Result Value Ref Range   Sodium 140 135 - 145 mmol/L   Potassium 3.5 3.5 - 5.1 mmol/L   Chloride 105 101 - 111 mmol/L   CO2 27 22 - 32 mmol/L   Glucose, Bld 142 (H) 65 - 99 mg/dL   BUN 25 (H) 6 - 20 mg/dL   Creatinine, Ser 0.74 0.44 - 1.00 mg/dL   Calcium 8.9 8.9 - 10.3 mg/dL   GFR calc non Af Amer >60 >60 mL/min   GFR calc Af Amer >60 >60 mL/min  Comment: (NOTE) The eGFR has been calculated using the CKD EPI equation. This calculation has not been validated in all clinical situations. eGFR's persistently <60 mL/min signify possible Chronic Kidney Disease.    Anion gap 8 5 - 15  Magnesium     Status: None   Collection Time: 01/25/17  4:22 AM  Result Value Ref Range   Magnesium 1.9 1.7 - 2.4 mg/dL  Phosphorus     Status: None   Collection Time: 01/25/17  4:22 AM  Result Value Ref Range    Phosphorus 3.5 2.5 - 4.6 mg/dL  Glucose, capillary     Status: Abnormal   Collection Time: 01/25/17  7:58 AM  Result Value Ref Range   Glucose-Capillary 113 (H) 65 - 99 mg/dL  Glucose, capillary     Status: Abnormal   Collection Time: 01/25/17 11:23 AM  Result Value Ref Range   Glucose-Capillary 189 (H) 65 - 99 mg/dL  Glucose, capillary     Status: Abnormal   Collection Time: 01/25/17  4:00 PM  Result Value Ref Range   Glucose-Capillary 127 (H) 65 - 99 mg/dL  Triglycerides     Status: None   Collection Time: 01/25/17  5:09 PM  Result Value Ref Range   Triglycerides 132 <150 mg/dL  Basic metabolic panel     Status: Abnormal   Collection Time: 01/25/17  5:09 PM  Result Value Ref Range   Sodium 143 135 - 145 mmol/L   Potassium 4.0 3.5 - 5.1 mmol/L   Chloride 107 101 - 111 mmol/L   CO2 27 22 - 32 mmol/L   Glucose, Bld 149 (H) 65 - 99 mg/dL   BUN 29 (H) 6 - 20 mg/dL   Creatinine, Ser 4.98 0.44 - 1.00 mg/dL   Calcium 9.0 8.9 - 51.9 mg/dL   GFR calc non Af Amer >60 >60 mL/min   GFR calc Af Amer >60 >60 mL/min    Comment: (NOTE) The eGFR has been calculated using the CKD EPI equation. This calculation has not been validated in all clinical situations. eGFR's persistently <60 mL/min signify possible Chronic Kidney Disease.    Anion gap 9 5 - 15  Magnesium     Status: Abnormal   Collection Time: 01/25/17  5:09 PM  Result Value Ref Range   Magnesium 2.6 (H) 1.7 - 2.4 mg/dL  Glucose, capillary     Status: Abnormal   Collection Time: 01/25/17  8:34 PM  Result Value Ref Range   Glucose-Capillary 114 (H) 65 - 99 mg/dL  Glucose, capillary     Status: Abnormal   Collection Time: 01/25/17 11:34 PM  Result Value Ref Range   Glucose-Capillary 133 (H) 65 - 99 mg/dL  Renal function panel     Status: Abnormal   Collection Time: 01/26/17  5:15 AM  Result Value Ref Range   Sodium 144 135 - 145 mmol/L   Potassium 3.4 (L) 3.5 - 5.1 mmol/L   Chloride 110 101 - 111 mmol/L   CO2 26 22 - 32  mmol/L   Glucose, Bld 102 (H) 65 - 99 mg/dL   BUN 33 (H) 6 - 20 mg/dL   Creatinine, Ser 0.34 0.44 - 1.00 mg/dL   Calcium 9.2 8.9 - 74.5 mg/dL   Phosphorus 3.2 2.5 - 4.6 mg/dL   Albumin 2.0 (L) 3.5 - 5.0 g/dL   GFR calc non Af Amer >60 >60 mL/min   GFR calc Af Amer >60 >60 mL/min    Comment: (NOTE) The eGFR has been  calculated using the CKD EPI equation. This calculation has not been validated in all clinical situations. eGFR's persistently <60 mL/min signify possible Chronic Kidney Disease.    Anion gap 8 5 - 15  CBC     Status: Abnormal   Collection Time: 01/26/17  5:15 AM  Result Value Ref Range   WBC 13.6 (H) 4.0 - 10.5 K/uL   RBC 3.38 (L) 3.87 - 5.11 MIL/uL   Hemoglobin 8.9 (L) 12.0 - 15.0 g/dL   HCT 28.2 (L) 36.0 - 46.0 %   MCV 83.4 78.0 - 100.0 fL   MCH 26.3 26.0 - 34.0 pg   MCHC 31.6 30.0 - 36.0 g/dL   RDW 16.0 (H) 11.5 - 15.5 %   Platelets 328 150 - 400 K/uL  Magnesium     Status: None   Collection Time: 01/26/17  5:15 AM  Result Value Ref Range   Magnesium 2.3 1.7 - 2.4 mg/dL  Glucose, capillary     Status: None   Collection Time: 01/26/17  5:23 AM  Result Value Ref Range   Glucose-Capillary 94 65 - 99 mg/dL  Glucose, capillary     Status: Abnormal   Collection Time: 01/26/17  8:03 AM  Result Value Ref Range   Glucose-Capillary 104 (H) 65 - 99 mg/dL  Glucose, capillary     Status: Abnormal   Collection Time: 01/26/17 11:17 AM  Result Value Ref Range   Glucose-Capillary 137 (H) 65 - 99 mg/dL   No results found.                           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  I patched both eyes with erythromycin ophthalmic ung since patient is headed to interventional radiology and will be sedated.  When patient gets back from radiology, the nurses will remove one patch so patient can still see something.  Use erythromycin ophthalmic ointment q 4 hours.  Nurses  will alternate patch between the eyes q 4 hours.  I showed her nurse how best to patch eyes.   Discontinue Clear Eyes and all other drops/gels except Erythromycin ointment at this time.  I will follow patient.  Call me if getting worse rather than better.   All of the above information was relayed to the patient and patient family.   Follow up contact information was provided.  All questions were answered.   [place] L 01/26/2017, 12:03 PM  [facility] Ophthalmology [phone]

## 2017-01-26 NOTE — Progress Notes (Signed)
Nutrition Follow-up  INTERVENTION:   Resume:  Vital AF 1.2 @ 40 ml/hr (960 ml/day) 30 ml Prostat BID Provides: 1352 kcal, 102 grams protein, and 778 ml H2O.   NUTRITION DIAGNOSIS:   Inadequate oral intake related to inability to eat as evidenced by NPO status. Ongoing.   GOAL:   Patient will meet greater than or equal to 90% of their needs Met.   MONITOR:   TF tolerance, Vent status, I & O's, Labs  ASSESSMENT:   66yo female with hx chronic cystitis, depression/anxiety who presented 1/29 with L>R BLE weakness, LUE weakness and worsening myalgias x 3-4 days.  She was recently dx with strep pharyngitis and completed a course of zithromax.   She was seen in consultation by neurology and felt her s/s were most c/w guillain barre. She was started on IVIG 1/30.  2/8 trach, remains on vent support 2/12 g-tube placed in IR, TF to resume after 24 hours  Patient is currently intubated on ventilator support MV: 9 L/min Temp (24hrs), Avg:98.7 F (37.1 C), Min:98.5 F (36.9 C), Max:98.9 F (37.2 C)  K+3.4   Diet Order:  Diet NPO time specified Except for: Sips with Meds  Skin:  Wound (see comment) (stage I sacrum)  Last BM:  2/12 300 ml via rectal tube  Height:   Ht Readings from Last 1 Encounters:  01/15/17 5' 2"  (1.575 m)    Weight:   Wt Readings from Last 1 Encounters:  01/26/17 155 lb 6.8 oz (70.5 kg)    Ideal Body Weight:  50 kg  BMI:  Body mass index is 28.43 kg/m.  Estimated Nutritional Needs:   Kcal:  8527  Protein:  100-110 grams  Fluid:  > 1.5 L/day  EDUCATION NEEDS:   No education needs identified at this time  [place], [place], [place] Pager [phone] After Hours Pager

## 2017-01-26 NOTE — Progress Notes (Signed)
Name: Joanne Morrison MRN: [id number] DOB: 09/17/1951    ADMISSION DATE:  01/12/2017 CONSULTATION DATE:  01/15/2017  REFERRING MD :  Sloan Leiter ([facility])   CHIEF COMPLAINT:  Dyspnea, potential for respiratory compromise   BRIEF PATIENT DESCRIPTION:  66 yo female presented with weakness and myalgias for 3 to 4 days.  She had recently completed therapy for Strep throat.  Concern was that she developed Guillain Barre syndrome, and she was started on IVIG 1/30.  She progressed to respiratory failure on 2/01 and required intubation.     SUBJECTIVE:  Trach clean On vent Was neg 1.6 liters  VITAL SIGNS: BP 112/73   Pulse 80   Temp 98.9 F (37.2 C) (Oral)   Resp 20   Ht 5\' 2"  (1.575 m)   Wt 70.5 kg (155 lb 6.8 oz)   SpO2 98%   BMI 28.43 kg/m   INTAKE / OUTPUT: I/O last 3 completed shifts: In: 2838.9 [I.V.:1018.9; NG/GT:1520; IV Piggyback:300] Out: [id number] [Urine:3295; Stool:875]  General: Acutely ill appearing female, NAD on full vent support on trach Neuro: Diffusely weak  Eyes: Pupils reactive, trach clean, dry Cardiac: RRR, Nl S1/S2, -M/R/G. Chest: Coarse BS bilaterally to bases Abd: Soft, NT, ND and +BS Ext: 1+ edema Skin: no rashes  LABS:  BMET  Recent Labs Lab 01/25/17 0422 01/25/17 1709 01/26/17 0515  NA 140 143 144  K 3.5 4.0 3.4*  CL 105 107 110  CO2 27 27 26   BUN 25* 29* 33*  CREATININE 0.74 0.79 0.70  GLUCOSE 142* 149* 102*   Electrolytes  Recent Labs Lab 01/24/17 0441 01/25/17 0422 01/25/17 1709 01/26/17 0515  CALCIUM 8.5* 8.9 9.0 9.2  MG 2.1 1.9 2.6* 2.3  PHOS 3.7 3.5  --  3.2   CBC  Recent Labs Lab 01/24/17 0438 01/25/17 0422 01/26/17 0515  WBC 14.1* 15.1* 13.6*  HGB 9.7* 8.9* 8.9*  HCT 30.1* 28.1* 28.2*  PLT 386 355 328   Coag's  Recent Labs Lab 01/23/17 0334  APTT 35  INR 1.05   Sepsis Markers No results for input(s): LATICACIDVEN, PROCALCITON, O2SATVEN in the last 168 hours. ABG  Recent Labs Lab 01/21/17 0242  01/22/17 0320  PHART 7.476* 7.473*  PCO2ART 41.8 42.5  PO2ART 83.2 82.2*   Liver Enzymes  Recent Labs Lab 01/24/17 0441 01/25/17 0422 01/26/17 0515  AST  --  65*  --   ALT  --  89*  --   ALKPHOS  --  180*  --   BILITOT  --  0.3  --   ALBUMIN 1.7* 1.8* 2.0*   Cardiac Enzymes No results for input(s): TROPONINI, PROBNP in the last 168 hours.  Glucose  Recent Labs Lab 01/25/17 0758 01/25/17 1123 01/25/17 1600 01/25/17 2034 01/25/17 2334 01/26/17 0523  GLUCAP 113* 189* 127* 114* 133* 94   Imaging No results found. SIGNIFICANT EVENTS  IVIG 1/30, 1/31, 2/1, 2/2  STUDIES:  MR brain 1/29>>>negative LP 1/30>>>glucose 55, RBC 6, WBC 3, protein 79  CULTURES: CSF 1/30>>>negative BC x 2 2/1>>> negative Respiratory culture 2/3 >>> rare gpc pairs>>>normal flora  ANTIBIOTICS: Diflucan 1/31 >>2/8 Unasyn 2/1>>>2/3 vanc 2/3>>>2/10 Zosyn 2/3>>>2/10  LINES/TUBES: ETT 2/1>>>2/9 Trach [employer]) 2/9>>> Right IJ CVL 2/1>>>  ASSESSMENT / PLAN:  PULMONARY Acute respiratory failure - in setting neuromuscular weakness presumed r/t guillane barre.   ?aspiration  P:   Proceed with weaning, PS 15-20, close observation apnea alarms, TV alarms Trach in place Titrate O2 for sat of 88-92% Was beg 1.7 liters,  maintain as renal fxn tolerates NIF, VC as able Improved rt base infiltrates overall, lasix mainatin  NEUROLOGIC Guillain barre syndrome  Sedation needs on vent  A-fib with RVR P:   RASS goal:0 Fentanyl gtt- goal off fentanyl patch 100 mcg PRN versed Neuro following  IVIg per neuro - complete, role plasma xchange? With refractory case limited progress? PT / OT attemptes on going  CARDIOVASCULAR Hx HTN  P:  Cardizem for HR control Hydralazine PRN labetolol prn also noted  RENAL Significant hyponatremia - new onset.  Low serum osm, high urine osm.  Likely r/t IVIG due to hyperproteinemia.    Continues to drop Na even with 3% P:   F/u chem  k supp Lasix  repeat, did well with this  GASTROINTESTINAL Dysphagia  P:   NPO  PPI  TF hold if for PEG Will need PEG tube - IR consulted, hope and prey is today  HEMATOLOGIC Leukocytosis  P:  SQ Heparin  Cbc in am   INFECTIOUS ? Aspiration  Leukocytosis  P:   S/p ABX course  ENDOCRINE No active issue  P:   Monitor glucose on chem   FAMILY  - Updates:  no family at bedside  - Inter-disciplinary family meet or Palliative Care meeting due by: 2/8  Ccm time 30 min   Lavon Paganini. Titus Mould, MD, [place] Pgr: [facility]

## 2017-01-26 NOTE — Progress Notes (Signed)
NiF -16 VC 737ml

## 2017-01-27 ENCOUNTER — Inpatient Hospital Stay (HOSPITAL_COMMUNITY): Payer: Medicare Other

## 2017-01-27 DIAGNOSIS — Z452 Encounter for adjustment and management of vascular access device: Secondary | ICD-10-CM

## 2017-01-27 DIAGNOSIS — Z789 Other specified health status: Secondary | ICD-10-CM | POA: Diagnosis not present

## 2017-01-27 DIAGNOSIS — R0602 Shortness of breath: Secondary | ICD-10-CM

## 2017-01-27 DIAGNOSIS — G610 Guillain-Barre syndrome: Secondary | ICD-10-CM | POA: Diagnosis not present

## 2017-01-27 DIAGNOSIS — Z4659 Encounter for fitting and adjustment of other gastrointestinal appliance and device: Secondary | ICD-10-CM

## 2017-01-27 LAB — BASIC METABOLIC PANEL
ANION GAP: 12 (ref 5–15)
BUN: 33 mg/dL — ABNORMAL HIGH (ref 6–20)
CALCIUM: 9.8 mg/dL (ref 8.9–10.3)
CHLORIDE: 109 mmol/L (ref 101–111)
CO2: 24 mmol/L (ref 22–32)
Creatinine, Ser: 0.74 mg/dL (ref 0.44–1.00)
GFR calc Af Amer: >60 mL/min (ref >60)
GFR calc non Af Amer: >60 mL/min (ref >60)
GLUCOSE: 112 mg/dL — AB (ref 65–99)
POTASSIUM: 3.5 mmol/L (ref 3.5–5.1)
Sodium: 145 mmol/L (ref 135–145)

## 2017-01-27 LAB — CBC WITH DIFFERENTIAL/PLATELET
BASOS PCT: 0 %
Basophils Absolute: 0 10*3/uL (ref 0.0–0.1)
Eosinophils Absolute: 0.1 10*3/uL (ref 0.0–0.7)
Eosinophils Relative: 1 %
HEMATOCRIT: 30.1 % — AB (ref 36.0–46.0)
HEMOGLOBIN: 9.5 g/dL — AB (ref 12.0–15.0)
LYMPHS PCT: 17 %
Lymphs Abs: 2.2 10*3/uL (ref 0.7–4.0)
MCH: 26.3 pg (ref 26.0–34.0)
MCHC: 31.6 g/dL (ref 30.0–36.0)
MCV: 83.4 fL (ref 78.0–100.0)
MONO ABS: 0.6 10*3/uL (ref 0.1–1.0)
MONOS PCT: 5 %
NEUTROS ABS: 10.3 10*3/uL — AB (ref 1.7–7.7)
NEUTROS PCT: 77 %
Platelets: 426 10*3/uL — ABNORMAL HIGH (ref 150–400)
RBC: 3.61 MIL/uL — ABNORMAL LOW (ref 3.87–5.11)
RDW: 15.8 % — AB (ref 11.5–15.5)
WBC: 13.3 10*3/uL — ABNORMAL HIGH (ref 4.0–10.5)

## 2017-01-27 LAB — GLUCOSE, CAPILLARY
GLUCOSE-CAPILLARY: 130 mg/dL — AB (ref 65–99)
GLUCOSE-CAPILLARY: 137 mg/dL — AB (ref 65–99)
Glucose-Capillary: 110 mg/dL — ABNORMAL HIGH (ref 65–99)
Glucose-Capillary: 123 mg/dL — ABNORMAL HIGH (ref 65–99)
Glucose-Capillary: 117 mg/dL — ABNORMAL HIGH (ref 65–99)
Glucose-Capillary: 141 mg/dL — ABNORMAL HIGH (ref 65–99)

## 2017-01-27 MED ORDER — LORAZEPAM 1 MG PO TABS
1.0000 mg | ORAL_TABLET | Freq: Two times a day (BID) | ORAL | Status: DC | PRN
  Administered 2017-01-28 – 2017-01-30 (×4): 1 mg via ORAL
  Filled 2017-01-27 (×4): qty 1

## 2017-01-27 MED ORDER — POTASSIUM CHLORIDE 20 MEQ/15ML (10%) PO SOLN
40.0000 meq | Freq: Once | ORAL | Status: AC
  Administered 2017-01-27: 40 meq
  Filled 2017-01-27: qty 30

## 2017-01-27 MED ORDER — FUROSEMIDE 10 MG/ML IJ SOLN
40.0000 mg | Freq: Once | INTRAMUSCULAR | Status: AC
  Administered 2017-01-27: 40 mg via INTRAVENOUS
  Filled 2017-01-27: qty 4

## 2017-01-27 NOTE — Progress Notes (Signed)
Physical Therapy Treatment Patient Details Name: Connee Rubach MRN: [id number] DOB: [birth year] Today's Date: 01/27/2017    History of Present Illness Patient is a 66 y.o. female with medical history significant of chronic low back pain, anxiety, depression, hypertension-recently had sorethroat (+strep) presented to the ED on 1/29 with lower ext weakness,tingling numbess that started in the lower extremities and subsequently started involving her upper extremities. Unable to elicit DTR in the lower ext. MRI of the entire spine negative. Concern for GBS-started on empiric IVIG by neurology.Developed respiratory failure and was intubated 2/1 and trasnferred to ICU.Marland Kitchen Trach 01/23/17.    PT Comments    Pt admitted with above diagnosis. Pt currently with functional limitations due to balance and endurance deficits. Pt was able to sit EOB but limited due to back pain.  Pt became very anxious due to this pain.  Pt vital signs did well despite pain and anxiety.   Will follow acutely and progress as pt tolerates.   Pt will benefit from skilled PT to increase their independence and safety with mobility to allow discharge to the venue listed below.    Follow Up Recommendations  CIR;Supervision/Assistance - 24 hour     Equipment Recommendations  Other (comment) (to be assessed )    Recommendations for Other Services       Precautions / Restrictions Precautions Precautions: Fall Precaution Comments: trach/vent; monitor BP, flexi seal Required Braces or Orthoses: Other Brace/Splint Other Brace/Splint: B PRAFO  Restrictions Weight Bearing Restrictions: No    Mobility  Bed Mobility Overal bed mobility: Needs Assistance Bed Mobility: Supine to Sit     Supine to sit: Total assist;+2 for physical assistance     General bed mobility comments: Using helicopter method, assisted pt to EOB.  Pt immediately shaking head with discomfort in low back.  Husband states pt with history of low back pain.  With  encouragement, pt was able to sit 5 minutes and still wanted to get to chair.  Transfers Overall transfer level: Needs assistance Equipment used: 2 person hand held assist Transfers: Lateral/Scoot Transfers          Lateral/Scoot Transfers: +2 physical assistance;Total assist General transfer comment: Used pad to scoot pt to chair.  Pt did not assist with transfer.  PT and OT had to support trunk and scoot pt with the pads.    Ambulation/Gait             General Gait Details: unable   Stairs            Wheelchair Mobility    Modified Rankin (Stroke Patients Only)       Balance Overall balance assessment: Needs assistance Sitting-balance support: Bilateral upper extremity supported;Feet supported Sitting balance-Leahy Scale: Poor Sitting balance - Comments: pt needs Max assist throughout sitting to maintain balance.  Trunk and UE ataxia noted.  Sat a total of 5 minutes.                              Cognition Arousal/Alertness: Awake/alert Behavior During Therapy: WFL for tasks assessed/performed Overall Cognitive Status: Difficult to assess                 General Comments: following 1 step commands consistently    Exercises General Exercises - Lower Extremity Ankle Circles/Pumps: Supine;AAROM;10 reps;Both Heel Slides: Both;Supine;AAROM;10 reps    General Comments General comments (skin integrity, edema, etc.): Pt very anxious with sitting due to low back pain.  Informed nurse and he will check with MD regarding meds.  Took incr time to position pt with pillows with pt gettitng comfortable after a bit.        Pertinent Vitals/Pain Pain Assessment: Faces Faces Pain Scale: Hurts worst Pain Location: hamstrings with stretch R>L Pain Descriptors / Indicators: Grimacing;Sore Pain Intervention(s): Limited activity within patient's tolerance;Monitored during session;Repositioned  O2 94-96% on 40% trach collar at rest.  92% O2 once in chair.   HR 74-100 bpm.  BP 150/69.   Home Living                      Prior Function            PT Goals (current goals can now be found in the care plan section) Acute Rehab PT Goals Patient Stated Goal: unable to state. Per family - to go to CIR Progress towards PT goals: Progressing toward goals    Frequency    Min 3X/week      PT Plan Current plan remains appropriate    Co-evaluation PT/OT/SLP Co-Evaluation/Treatment: Yes Reason for Co-Treatment: Complexity of the patient's impairments (multi-system involvement) PT goals addressed during session: Mobility/safety with mobility       End of Session Equipment Utilized During Treatment: Oxygen (40% trach collar) Activity Tolerance: Patient limited by fatigue;Patient limited by pain Patient left: with call bell/phone within reach;with family/visitor present;in chair     Time: [id number] PT Time Calculation (min) (ACUTE ONLY): 30 min  Charges:  $Therapeutic Activity: 8-22 mins                    G Codes:      Godfrey Pick Linh Johannes 2017/02/05, 10:00 AM Jazae Gandolfi,PT Acute Rehabilitation [phone] (pager)

## 2017-01-27 NOTE — Progress Notes (Signed)
SLP Cancellation Note  Patient Details Name: Joanne Morrison MRN: [id number] DOB: Aug 31, 1951   Cancelled treatment:         Plan was for possible in-line Passy-Muir speaking valve. RN reported Dr. Harland Dingwall preferred to defer today due to increased severity of copious secretions today. ST will follow.   Houston Siren 01/27/2017, 1:04 PM  Orbie Pyo Colvin Caroli.Ed [employer] [phone]

## 2017-01-27 NOTE — Plan of Care (Signed)
Problem: Nutrition: Goal: Adequate nutrition will be maintained Outcome: Progressing PEG places on 01/26/17

## 2017-01-27 NOTE — Progress Notes (Signed)
RT note:  VC 0.85 mL NIF -24  With good patient effort.

## 2017-01-27 NOTE — Progress Notes (Signed)
Name: Joanne Morrison MRN: [id number] DOB: 1951-02-04    ADMISSION DATE:  01/12/2017 CONSULTATION DATE:  01/15/2017  REFERRING MD :  Sloan Leiter ([facility])   CHIEF COMPLAINT:  Dyspnea, potential for respiratory compromise   BRIEF PATIENT DESCRIPTION:  66 yo female presented with weakness and myalgias for 3 to 4 days.  She had recently completed therapy for Strep throat.  Concern was that she developed Guillain Barre syndrome, and she was started on IVIG 1/30.  She progressed to respiratory failure on 2/01 and required intubation.     SUBJECTIVE:  Peg placed Ps 8 done Neg 3.2 liters  VITAL SIGNS: BP (!) 171/105   Pulse 100   Temp 99.3 F (37.4 C) (Oral)   Resp (!) 27   Ht 5\' 2"  (1.575 m)   Wt 68.8 kg (151 lb 10.8 oz)   SpO2 94%   BMI 27.74 kg/m   INTAKE / OUTPUT: I/O last 3 completed shifts: In: 713.1 [I.V.:313.1; NG/GT:400] Out: [id number] [Urine:3525; Stool:750]  General: Acutely ill appearing female, NAD on wean Neuro: Diffusely weak, improved upper ext, weak cough Eyes: Pupils reactive, trach dry Cardiac: RRR, Nl S1/S2 Chest: Coarse ronchi mild Abd: Soft, NT, ND and +BS Ext: 1+ edema Skin: no rashes  LABS:  BMET  Recent Labs Lab 01/25/17 1709 01/26/17 0515 01/27/17 0141  NA 143 144 145  K 4.0 3.4* 3.5  CL 107 110 109  CO2 27 26 24   BUN 29* 33* 33*  CREATININE 0.79 0.70 0.74  GLUCOSE 149* 102* 112*   Electrolytes  Recent Labs Lab 01/24/17 0441 01/25/17 0422 01/25/17 1709 01/26/17 0515 01/27/17 0141  CALCIUM 8.5* 8.9 9.0 9.2 9.8  MG 2.1 1.9 2.6* 2.3  --   PHOS 3.7 3.5  --  3.2  --    CBC  Recent Labs Lab 01/25/17 0422 01/26/17 0515 01/27/17 0141  WBC 15.1* 13.6* 13.3*  HGB 8.9* 8.9* 9.5*  HCT 28.1* 28.2* 30.1*  PLT 355 328 426*   Coag's  Recent Labs Lab 01/23/17 0334  APTT 35  INR 1.05   Sepsis Markers No results for input(s): LATICACIDVEN, PROCALCITON, O2SATVEN in the last 168 hours. ABG  Recent Labs Lab 01/21/17 0242  01/22/17 0320  PHART 7.476* 7.473*  PCO2ART 41.8 42.5  PO2ART 83.2 82.2*   Liver Enzymes  Recent Labs Lab 01/24/17 0441 01/25/17 0422 01/26/17 0515  AST  --  65*  --   ALT  --  89*  --   ALKPHOS  --  180*  --   BILITOT  --  0.3  --   ALBUMIN 1.7* 1.8* 2.0*   Cardiac Enzymes  Recent Labs Lab 01/26/17 1519  TROPONINI <0.03    Glucose  Recent Labs Lab 01/26/17 1117 01/26/17 1626 01/26/17 2021 01/27/17 0000 01/27/17 0410 01/27/17 0758  GLUCAP 137* 128* 115* 134* 110* 123*   Imaging Ir Gastrostomy Tube Mod Sed  Result Date: 01/26/2017 INDICATION: Workup for Guillain-Barre. Please perform percutaneous gastrostomy tube placement for enteric nutrition supplementation. EXAM: PULL TROUGH GASTROSTOMY TUBE PLACEMENT COMPARISON:  CT abdomen and pelvis - 01/22/2016 MEDICATIONS: Ancef 2 gm IV; Antibiotics were administered within 1 hour of the procedure. Glucagon 1 mg IV CONTRAST:  40 mL of Isovue 300 administered into the gastric lumen. ANESTHESIA/SEDATION: Moderate (conscious) sedation was employed during this procedure. A total of Versed 1 mg and Fentanyl 50 mcg was administered intravenously. Moderate Sedation Time: 10 minutes. The patient's level of consciousness and vital signs were monitored continuously by radiology nursing throughout  the procedure under my direct supervision. FLUOROSCOPY TIME:  1 minute 42 seconds (18 mGy) COMPLICATIONS: None immediate. PROCEDURE: Informed written consent was obtained from the patient following explanation of the procedure, risks, benefits and alternatives. A time out was performed prior to the initiation of the procedure. Ultrasound scanning was performed to demarcate the edge of the left lobe of the liver. Maximal barrier sterile technique utilized including caps, mask, sterile gowns, sterile gloves, large sterile drape, hand hygiene and Betadine prep. The left upper quadrant was sterilely prepped and draped. An oral gastric catheter was  inserted into the stomach under fluoroscopy. The existing nasogastric feeding tube was removed. The left costal margin and air opacified transverse colon were identified and avoided. Air was injected into the stomach for insufflation and visualization under fluoroscopy. Under sterile conditions a 17 gauge trocar needle was utilized to access the stomach percutaneously beneath the left subcostal margin after the overlying soft tissues were anesthetized with 1% Lidocaine with epinephrine. Needle position was confirmed within the stomach with aspiration of air and injection of small amount of contrast. A single T tack was deployed for gastropexy. Over an Amplatz guide wire, a 9-French sheath was inserted into the stomach. A snare device was utilized to capture the oral gastric catheter. The snare device was pulled retrograde from the stomach up the esophagus and out the oropharynx. The 20-French pull-through gastrostomy was connected to the snare device and pulled antegrade through the oropharynx down the esophagus into the stomach and then through the percutaneous tract external to the patient. The gastrostomy was assembled externally. Contrast injection confirms position in the stomach. Several spot radiographic images were obtained in various obliquities for documentation. The patient tolerated procedure well without immediate post procedural complication. FINDINGS: After successful fluoroscopic guided placement, the gastrostomy tube is appropriately positioned with internal disc against the ventral aspect of the gastric lumen. IMPRESSION: Successful fluoroscopic insertion of a 20-French pull-through gastrostomy tube. The gastrostomy may be used immediately for medication administration and in 24 hrs for the initiation of feeds. Electronically Signed   By: Sandi Mariscal M.D.   On: 01/26/2017 14:53   Dg Chest Port 1 View  Result Date: 01/27/2017 CLINICAL DATA:  Guillain-Barre syndrome nonsmoker. Tracheostomy  patient. EXAM: PORTABLE CHEST 1 VIEW COMPARISON:  Portable chest x-ray of January 23, 2017 FINDINGS: The lungs are adequately inflated. There is persistent infiltrate at both bases. There is no pleural effusion. The heart and pulmonary vascularity are normal. The tracheostomy appliance tip projects at the superior margin of the clavicular heads. IMPRESSION: Persistent bibasilar atelectasis or pneumonia, slight interval improvement since yesterday's study. Electronically Signed   By: David  Martinique M.D.   On: 01/27/2017 07:01   SIGNIFICANT EVENTS  IVIG 1/30--> completed coiurse per neuro  STUDIES:  MR brain 1/29>>>negative LP 1/30>>>glucose 55, RBC 6, WBC 3, protein 79  CULTURES: CSF 1/30>>>negative BC x 2 2/1>>> negative Respiratory culture 2/3 >>> rare gpc pairs>>>normal flora  ANTIBIOTICS: Diflucan 1/31 >>2/8 Unasyn 2/1>>>2/3 vanc 2/3>>>2/10 Zosyn 2/3>>>2/10  LINES/TUBES: ETT 2/1>>>2/9 Trach Hyman Bible) 2/9>>> Right IJ CVL 2/1>>>2/12  ASSESSMENT / PLAN:  PULMONARY Acute respiratory failure - in setting neuromuscular weakness presumed r/t guillane barre.   ?aspiration PNA P:   Was neg again 3 liters, likely improving resp weaning No new pcxr Wean PS 8 done, will push to TC trial attempts 30 min with direct observation Follow secretion control Cuff should stay up  NEUROLOGIC Guillain barre syndrome  Sedation needs on vent  A-fib with RVR  P:   RASS goal:0 fentanyl patch 100 mcg PRN versed IVIg per neuro - complete, d/w neuro no plasma x change planned unless 2-4 weeks go bye and no progress PT / OT attemptes on going  CARDIOVASCULAR Hx HTN  Resolved vagal from peg P:  Cardizem for HR control- follow HR response, may need to dc Hydralazine PRN labetolol prn dc  RENAL Significant hyponatremia - resolved edema P:   F/u chem in am   k supp Lasix repeat same dose  GASTROINTESTINAL Dysphagia , s/p peg P:   NPO  PPI  TF re add at 24 hours post  peg  HEMATOLOGIC Leukocytosis  hemoconcetration P:  SQ Heparin  Cbc in am  lasix  INFECTIOUS ? Aspiration  Leukocytosis also contribution fro lasix P:   S/p ABX course  ENDOCRINE No active issue  P:   Monitor glucose on chem   FAMILY  - Updates:  To husband by me  - Inter-disciplinary family meet or Palliative Care meeting due by: 2/8  Ccm time 30 min   Lavon Paganini. Titus Mould, MD, [place] Pgr: [facility]

## 2017-01-27 NOTE — Progress Notes (Signed)
NIF=-22 with Vital Capacity=.650

## 2017-01-27 NOTE — Progress Notes (Signed)
Occupational Therapy Treatment Patient Details Name: Joanne Morrison MRN: [id number] DOB: 10/01/1951 Today's Date: 01/27/2017    History of present illness Patient is a 66 y.o. female with medical history significant of chronic low back pain, anxiety, depression, hypertension-recently had sorethroat (+strep) presented to the ED on 1/29 with lower ext weakness,tingling numbess that started in the lower extremities and subsequently started involving her upper extremities. Unable to elicit DTR in the lower ext. MRI of the entire spine negative. Concern for GBS-started on empiric IVIG by neurology.Developed respiratory failure and was intubated 2/1 and trasnferred to ICU.Marland Kitchen Trach 01/23/17.   OT comments  Focus of session on sitting tolerance. Pt tolerated supported sitting at EOB x 5 minutes with complaints of back pain (chronic per husband). Performed lateral scoot to drop arm recliner with 2 person total assist and positioned with pillow for comfort. Pt with increased anxiety, unclear if pain related or fearful of movement. VSS throughout. Will continue to follow.  Follow Up Recommendations  CIR;Supervision/Assistance - 24 hour    Equipment Recommendations       Recommendations for Other Services      Precautions / Restrictions Precautions Precautions: Fall Precaution Comments: trach/vent; monitor BP, flexi seal Required Braces or Orthoses: Other Brace/Splint Other Brace/Splint: B PRAFO  Restrictions Weight Bearing Restrictions: No       Mobility Bed Mobility Overal bed mobility: Needs Assistance Bed Mobility: Supine to Sit     Supine to sit: Total assist;+2 for physical assistance     General bed mobility comments: Using helicopter method, assisted pt to EOB.  Pt immediately shaking head with discomfort in low back.  Husband states pt with history of low back pain.  With encouragement, pt was able to sit 5 minutes and still wanted to get to chair.  Transfers Overall transfer  level: Needs assistance Equipment used: None Transfers: Lateral/Scoot Transfers          Lateral/Scoot Transfers: +2 physical assistance;Total assist General transfer comment: Used pad to scoot pt to chair.  Pt did not assist with transfer.  PT and OT had to support trunk and scoot pt with the pads.      Balance Overall balance assessment: Needs assistance Sitting-balance support: Bilateral upper extremity supported;Feet supported Sitting balance-Leahy Scale: Poor Sitting balance - Comments: pt needs Max assist throughout sitting to maintain balance.  Trunk and UE ataxia noted.  Sat a total of 5 minutes.                             ADL                       Lower Body Dressing: Total assistance;Bed level   Toilet Transfer: Total assistance;+2 for physical assistance;Requires drop arm Toilet Transfer Details (indicate cue type and reason): simulated to lateral transfer to drop arm recliner                  Vision                 Additional Comments: pt with patch over R eye   Perception     Praxis      Cognition   Behavior During Therapy: Anxious Overall Cognitive Status: Difficult to assess                  General Comments: following 1 step commands consistently    Extremity/Trunk Assessment  Exercises General Exercises - Upper Extremity Shoulder Flexion: AAROM;Both;5 reps;Supine Elbow Flexion: AAROM;Both;5 reps;Supine Elbow Extension: AAROM;Both;5 reps;Supine Digit Composite Flexion: AROM;5 reps;Both;Supine Composite Extension: Both;Supine;5 reps;AROM    Shoulder Instructions       General Comments      Pertinent Vitals/ Pain       Pain Assessment: Faces Faces Pain Scale: Hurts worst Pain Location: back, hamstrings with stretching Pain Descriptors / Indicators: Grimacing;Sore Pain Intervention(s): Monitored during session;Patient requesting pain meds-RN notified;Repositioned  Home Living                                           Prior Functioning/Environment              Frequency  Min 2X/week        Progress Toward Goals  OT Goals(current goals can now be found in the care plan section)  Progress towards OT goals: Progressing toward goals  Acute Rehab OT Goals Patient Stated Goal: unable to state. Per family - to go to CIR Time For Goal Achievement: 02/05/17 Potential to Achieve Goals: Good  Plan Discharge plan remains appropriate    Co-evaluation    PT/OT/SLP Co-Evaluation/Treatment: Yes Reason for Co-Treatment: Complexity of the patient's impairments (multi-system involvement);For patient/therapist safety PT goals addressed during session: Mobility/safety with mobility OT goals addressed during session: Strengthening/ROM      End of Session Equipment Utilized During Treatment: Oxygen (40% trach collar)   Activity Tolerance Patient tolerated treatment well   Patient Left in chair;with call bell/phone within reach;with family/visitor present   Nurse Communication Patient requests pain meds;Need for lift equipment;Mobility status (anxiety, ?cognition)        Time: [id number] OT Time Calculation (min): 30 min  Charges: OT General Charges $OT Visit: 1 Procedure OT Treatments $Therapeutic Activity: 8-22 mins  Malka So 01/27/2017, 10:49 AM  [phone]

## 2017-01-28 ENCOUNTER — Encounter (HOSPITAL_COMMUNITY): Payer: Self-pay | Admitting: Emergency Medicine

## 2017-01-28 DIAGNOSIS — Z9289 Personal history of other medical treatment: Secondary | ICD-10-CM | POA: Diagnosis not present

## 2017-01-28 DIAGNOSIS — R29898 Other symptoms and signs involving the musculoskeletal system: Secondary | ICD-10-CM | POA: Diagnosis not present

## 2017-01-28 DIAGNOSIS — R531 Weakness: Secondary | ICD-10-CM | POA: Diagnosis not present

## 2017-01-28 LAB — COMPREHENSIVE METABOLIC PANEL
ALT: 144 U/L — ABNORMAL HIGH (ref 14–54)
ANION GAP: 11 (ref 5–15)
AST: 112 U/L — ABNORMAL HIGH (ref 15–41)
Albumin: 2.5 g/dL — ABNORMAL LOW (ref 3.5–5.0)
Alkaline Phosphatase: 135 U/L — ABNORMAL HIGH (ref 38–126)
BUN: 46 mg/dL — ABNORMAL HIGH (ref 6–20)
CHLORIDE: 111 mmol/L (ref 101–111)
CO2: 26 mmol/L (ref 22–32)
Calcium: 10.6 mg/dL — ABNORMAL HIGH (ref 8.9–10.3)
Creatinine, Ser: 1.02 mg/dL — ABNORMAL HIGH (ref 0.44–1.00)
GFR calc non Af Amer: 57 mL/min — ABNORMAL LOW (ref >60)
Glucose, Bld: 147 mg/dL — ABNORMAL HIGH (ref 65–99)
Potassium: 3.1 mmol/L — ABNORMAL LOW (ref 3.5–5.1)
SODIUM: 148 mmol/L — AB (ref 135–145)
Total Bilirubin: 0.7 mg/dL (ref 0.3–1.2)
Total Protein: 8.8 g/dL — ABNORMAL HIGH (ref 6.5–8.1)

## 2017-01-28 LAB — CBC WITH DIFFERENTIAL/PLATELET
BASOS PCT: 0 %
Basophils Absolute: 0 10*3/uL (ref 0.0–0.1)
Eosinophils Absolute: 0.1 10*3/uL (ref 0.0–0.7)
Eosinophils Relative: 0 %
HEMATOCRIT: 33.4 % — AB (ref 36.0–46.0)
HEMOGLOBIN: 10.3 g/dL — AB (ref 12.0–15.0)
LYMPHS ABS: 1.9 10*3/uL (ref 0.7–4.0)
Lymphocytes Relative: 15 %
MCH: 26.3 pg (ref 26.0–34.0)
MCHC: 30.8 g/dL (ref 30.0–36.0)
MCV: 85.2 fL (ref 78.0–100.0)
MONOS PCT: 7 %
Monocytes Absolute: 0.9 10*3/uL (ref 0.1–1.0)
NEUTROS ABS: 10.3 10*3/uL — AB (ref 1.7–7.7)
NEUTROS PCT: 78 %
Platelets: 505 10*3/uL — ABNORMAL HIGH (ref 150–400)
RBC: 3.92 MIL/uL (ref 3.87–5.11)
RDW: 16.2 % — ABNORMAL HIGH (ref 11.5–15.5)
WBC: 13.3 10*3/uL — ABNORMAL HIGH (ref 4.0–10.5)

## 2017-01-28 LAB — GLUCOSE, CAPILLARY
GLUCOSE-CAPILLARY: 136 mg/dL — AB (ref 65–99)
GLUCOSE-CAPILLARY: 151 mg/dL — AB (ref 65–99)
Glucose-Capillary: 144 mg/dL — ABNORMAL HIGH (ref 65–99)
Glucose-Capillary: 140 mg/dL — ABNORMAL HIGH (ref 65–99)
Glucose-Capillary: 132 mg/dL — ABNORMAL HIGH (ref 65–99)
Glucose-Capillary: 140 mg/dL — ABNORMAL HIGH (ref 65–99)

## 2017-01-28 LAB — PHOSPHORUS: PHOSPHORUS: 4.1 mg/dL (ref 2.5–4.6)

## 2017-01-28 LAB — MAGNESIUM: Magnesium: 2.3 mg/dL (ref 1.7–2.4)

## 2017-01-28 MED ORDER — POTASSIUM CHLORIDE 20 MEQ PO PACK
40.0000 meq | PACK | Freq: Two times a day (BID) | ORAL | Status: DC
  Filled 2017-01-28: qty 2

## 2017-01-28 MED ORDER — POTASSIUM CHLORIDE 20 MEQ/15ML (10%) PO SOLN
40.0000 meq | Freq: Two times a day (BID) | ORAL | Status: DC
  Administered 2017-01-28: 40 meq via ORAL
  Filled 2017-01-28: qty 30

## 2017-01-28 MED ORDER — DOCUSATE SODIUM 50 MG/5ML PO LIQD: 100.0000 mg | Freq: Two times a day (BID) | ORAL | Status: DC | PRN

## 2017-01-28 MED ORDER — SERTRALINE HCL 50 MG PO TABS
50.0000 mg | ORAL_TABLET | Freq: Every day | ORAL | Status: DC
  Administered 2017-01-28 – 2017-01-30 (×3): 50 mg via ORAL
  Filled 2017-01-28 (×3): qty 1

## 2017-01-28 MED ORDER — FREE WATER
100.0000 mL | Freq: Three times a day (TID) | Status: DC
  Administered 2017-01-28 – 2017-01-30 (×7): 100 mL

## 2017-01-28 MED ORDER — SERTRALINE HCL 50 MG PO TABS: 50.0000 mg | ORAL_TABLET | Freq: Every day | ORAL | Status: DC

## 2017-01-28 MED ORDER — POTASSIUM CHLORIDE 20 MEQ/15ML (10%) PO SOLN
40.0000 meq | Freq: Two times a day (BID) | ORAL | Status: AC
  Administered 2017-01-28: 40 meq
  Filled 2017-01-28: qty 30

## 2017-01-28 MED ORDER — FENTANYL 50 MCG/HR TD PT72
50.0000 ug | MEDICATED_PATCH | TRANSDERMAL | Status: DC
  Administered 2017-01-28 – 2017-02-03 (×3): 50 ug via TRANSDERMAL
  Filled 2017-01-28 (×3): qty 1

## 2017-01-28 MED ORDER — ACETAMINOPHEN 160 MG/5ML PO SOLN
650.0000 mg | Freq: Four times a day (QID) | ORAL | Status: DC | PRN
  Administered 2017-02-03: 650 mg
  Filled 2017-01-28: qty 20.3

## 2017-01-28 NOTE — Progress Notes (Signed)
[facility] Progress Note Patient Name: Joanne Morrison DOB: 18-Jul-1951 MRN: [id number]   Date of Service  01/28/2017  HPI/Events of Note  K=3.1  eICU Interventions  Replace K via pEG     Intervention Category Major Interventions: Electrolyte abnormality - evaluation and management  Wrenley Sayed 01/28/2017, 5:01 AM

## 2017-01-28 NOTE — Progress Notes (Signed)
Patient is still on 30% ATC tolerating well, patient wants to continue to stay on TC, patient will let RT and RN know when she gets tired and wants to go back on vent.

## 2017-01-28 NOTE — Progress Notes (Signed)
SLP Cancellation Note  Patient Details Name: Shaquita Pecher MRN: [id number] DOB: August 16, 1951   Cancelled treatment:       Reason Eval/Treat Not Completed: Fatigue/lethargy limiting ability to participate. Hopeful to catch pt on ATC tomorrow for PMSV.    Kyndle Schlender, Katherene Ponto 01/28/2017, 2:55 PM

## 2017-01-28 NOTE — Progress Notes (Signed)
NIF -18 cmH2O X3, VC 600-700 ml X3.

## 2017-01-28 NOTE — Progress Notes (Signed)
Name: Joanne Morrison MRN: [id number] DOB: May 24, 1951    ADMISSION DATE:  01/12/2017 CONSULTATION DATE:  01/15/2017  REFERRING MD :  Sloan Leiter ([facility])   CHIEF COMPLAINT:  Dyspnea, potential for respiratory compromise   BRIEF PATIENT DESCRIPTION:  66 yo female presented with weakness and myalgias for 3 to 4 days.  She had recently completed therapy for Strep throat.  Concern was that she developed Guillain Barre syndrome, and she was started on IVIG 1/30.  She progressed to respiratory failure on 2/01 and required intubation.     SUBJECTIVE:  trach collar suucesfull a few hours Neg 1.8 liters  VITAL SIGNS: BP 105/81 (BP Location: Right Arm)   Pulse (!) 101   Temp 100.2 F (37.9 C) (Axillary)   Resp 19   Ht 5\' 2"  (1.575 m)   Wt 66.3 kg (146 lb 2.6 oz)   SpO2 96%   BMI 26.73 kg/m   INTAKE / OUTPUT: I/O last 3 completed shifts: In: 287 [I.V.:13; NG/GT:274] Out: 2945 [Urine:2895; Stool:50]  General: Acutely ill appearing female, on TC this aqm Neuro: Diffusely weak unchanged Eyes: Pupils reactive, trach dry Cardiac: RRR, Nl S1/S2 Chest: less coarse Abd: Soft, NT, ND and +BS Ext: 0+ edema Skin: no rashes  LABS:  BMET  Recent Labs Lab 01/26/17 0515 01/27/17 0141 01/28/17 0307  NA 144 145 148*  K 3.4* 3.5 3.1*  CL 110 109 111  CO2 26 24 26   BUN 33* 33* 46*  CREATININE 0.70 0.74 1.02*  GLUCOSE 102* 112* 147*   Electrolytes  Recent Labs Lab 01/25/17 0422 01/25/17 1709 01/26/17 0515 01/27/17 0141 01/28/17 0307  CALCIUM 8.9 9.0 9.2 9.8 10.6*  MG 1.9 2.6* 2.3  --  2.3  PHOS 3.5  --  3.2  --  4.1   CBC  Recent Labs Lab 01/26/17 0515 01/27/17 0141 01/28/17 0307  WBC 13.6* 13.3* 13.3*  HGB 8.9* 9.5* 10.3*  HCT 28.2* 30.1* 33.4*  PLT 328 426* 505*   Coag's  Recent Labs Lab 01/23/17 0334  APTT 35  INR 1.05   Sepsis Markers No results for input(s): LATICACIDVEN, PROCALCITON, O2SATVEN in the last 168 hours. ABG  Recent Labs Lab  01/22/17 0320  PHART 7.473*  PCO2ART 42.5  PO2ART 82.2*   Liver Enzymes  Recent Labs Lab 01/25/17 0422 01/26/17 0515 01/28/17 0307  AST 65*  --  112*  ALT 89*  --  144*  ALKPHOS 180*  --  135*  BILITOT 0.3  --  0.7  ALBUMIN 1.8* 2.0* 2.5*   Cardiac Enzymes  Recent Labs Lab 01/26/17 1519  TROPONINI <0.03    Glucose  Recent Labs Lab 01/27/17 1156 01/27/17 1603 01/27/17 1949 01/27/17 2353 01/28/17 0415 01/28/17 0800  GLUCAP 130* 117* 141* 137* 144* 136*   Imaging No results found. SIGNIFICANT EVENTS  IVIG 1/30--> completed coiurse per neuro  STUDIES:  MR brain 1/29>>>negative LP 1/30>>>glucose 55, RBC 6, WBC 3, protein 79  CULTURES: CSF 1/30>>>negative BC x 2 2/1>>> negative Respiratory culture 2/3 >>> rare gpc pairs>>>normal flora  ANTIBIOTICS: Diflucan 1/31 >>2/8 Unasyn 2/1>>>2/3 vanc 2/3>>>2/10 Zosyn 2/3>>>2/10  LINES/TUBES: ETT 2/1>>>2/9 Trach Hyman Bible) 2/9>>> Right IJ CVL 2/1>>>2/12  ASSESSMENT / PLAN:  PULMONARY Acute respiratory failure - in setting neuromuscular weakness presumed r/t guillane barre.   ?aspiration PNA P:   repeat TC goal 8 hours Dc lasix, crt rise, max med Mobilize as able If to [facility] success then drop cuff as secretions are better  NEUROLOGIC Guillain barre syndrome  Sedation needs on vent  A-fib with RVR Depression ? P:   RASS goal:0 fentanyl patch 100 mcg PRN versed add ssri PT / OT attemptes on going    FAMILY  - Updates:  To husband by me daily  - Inter-disciplinary family meet or Palliative Care meeting due by: 2/8  Await sdu bed  daqy 3 Will follow tid weekly for now  Lavon Paganini. Titus Mould, MD, [place] Pgr: [place] [facility]

## 2017-01-28 NOTE — Progress Notes (Signed)
Placed patient on 30% ATC per MD's request @0845 

## 2017-01-28 NOTE — Progress Notes (Signed)
[place] TEAM 1 - Stepdown/ICU TEAM  Joanne Morrison  VFI:[id number] DOB: 1951/06/17 DOA: 01/12/2017 PCP: Jerlyn Ly, MD    Brief Narrative:  66 yo female presented with weakness and myalgias for 3 to 4 days.  She had recently completed therapy for Strep throat.  Concern was that she developed Guillain Barre syndrome, and she was started on IVIG 1/30.  She progressed to respiratory failure on 2/01 and required intubation.     Significant Events: 1/29 admit to [facility]  1/30 LP - IVIG initiated 2/1 intubated 2/9 trach  Subjective: Pt was seen by PCCM today, and all active issues were addressed.  No role for redundant TRH exam today.  Pt is now on [place], and our daily visits will begin 2/15.  No charge from [facility] today.    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 rate (!) 27, height 5' 2"  (1.575 m), weight 66.3 kg (146 lb 2.6 oz), SpO2 97 %.  Intake/Output Summary (Last 24 hours) at 01/28/17 1358 Last data filed at 01/28/17 1200  Gross per 24 hour  Intake              607 ml  Output             1325 ml  Net             -718 ml   Filed Weights   01/26/17  0500 01/27/17 0411 01/28/17 0400  Weight: 70.5 kg (155 lb 6.8 oz) 68.8 kg (151 lb 10.8 oz) 66.3 kg (146 lb 2.6 oz)    Examination: No exam today   CBC:  Recent Labs Lab 01/24/17 0438 01/25/17 0422 01/26/17 0515 01/27/17 0141 01/28/17 0307  WBC 14.1* 15.1* 13.6* 13.3* 13.3*  NEUTROABS  --   --   --  10.3* 10.3*  HGB 9.7* 8.9* 8.9* 9.5* 10.3*  HCT 30.1* 28.1* 28.2* 30.1* 33.4*  MCV 81.6 82.4 83.4 83.4 85.2  PLT 386 355 328 426* 416*   Basic Metabolic Panel:  Recent Labs Lab 01/23/17 0943 01/24/17 0441 01/25/17 0422 01/25/17 1709 01/26/17 0515 01/27/17 0141 01/28/17 0307  NA 136 138 140 143 144 145 148*  K 3.7 3.1* 3.5 4.0 3.4* 3.5 3.1*  CL 97* 100* 105 107 110 109 111  CO2 27 26 27 27 26 24 26   GLUCOSE 112* 145* 142* 149* 102* 112* 147*  BUN 34* 24* 25* 29* 33* 33* 46*  CREATININE 0.85 0.71 0.74 0.79 0.70 0.74 1.02*  CALCIUM 9.1 8.5* 8.9 9.0 9.2 9.8 10.6*  MG  --  2.1 1.9 2.6* 2.3  --  2.3  PHOS 4.5 3.7 3.5  --  3.2  --  4.1  GFR: Estimated Creatinine Clearance: 49.8 mL/min (by C-G formula based on SCr of 1.02 mg/dL (H)).  Liver Function Tests:  Recent Labs Lab 01/23/17 0943 01/24/17 0441 01/25/17 0422 01/26/17 0515 01/28/17 0307  AST  --   --  65*  --  112*  ALT  --   --  89*  --  144*  ALKPHOS  --   --  180*  --  135*  BILITOT  --   --  0.3  --  0.7  PROT  --   --  7.2  --  8.8*  ALBUMIN 1.9* 1.7* 1.8* 2.0* 2.5*    Coagulation Profile:  Recent Labs Lab 01/23/17 0334  INR 1.05    Cardiac Enzymes:  Recent Labs Lab 01/26/17 1519  TROPONINI <0.03    HbA1C: Hgb A1c MFr Bld  Date/Time Value Ref Range Status  12/05/2009 01:06 PM 5.5 4.6 - 6.5 % Final    Comment:    See lab report for associated comment(s)  11/22/2008 12:00 AM 5.5 4.6 - 6.0 % Final    Comment:    See lab report for associated comment(s)    CBG:  Recent Labs Lab 01/27/17 1949 01/27/17 2353 01/28/17 0415 01/28/17 0800 01/28/17 1200  GLUCAP 141* 137* 144* 136*  140*    Scheduled Meds: . chlorhexidine gluconate (MEDLINE KIT)  15 mL Mouth Rinse BID  . erythromycin  1 application Both Eyes O0B  . feeding supplement (PRO-STAT SUGAR FREE 64)  30 mL Per Tube BID  . feeding supplement (VITAL AF 1.2 CAL)  1,000 mL Per Tube Q24H  . fentaNYL  50 mcg Transdermal Q72H  . heparin subcutaneous  5,000 Units Subcutaneous Q8H  . insulin aspart  0-15 Units Subcutaneous Q4H  . lidocaine  1 patch Transdermal Q24H  . mouth rinse  15 mL Mouth Rinse 10 times per day  . pantoprazole sodium  40 mg Per Tube Q1200  . potassium chloride  40 mEq Oral BID  . sertraline  50 mg Oral QHS  . sodium chloride flush  10-40 mL Intracatheter Q12H  . sodium chloride flush  3 mL Intravenous Q12H     LOS: 15 days   Cherene Altes, MD [facility] Office  [phone] Pager - Text Page per Shea Evans as per below:  On-Call/Text Page:      [email or web address]      password [email or web address]  If 7PM-7AM, please contact night-coverage [email or web address] Password [email or web address] 01/28/2017, 1:58 PM

## 2017-01-28 NOTE — Progress Notes (Signed)
Physical Therapy Treatment Patient Details Name: Joanne Morrison MRN: [id number] DOB: Sep 15, 1951 Today's Date: 01/28/2017    History of Present Illness Patient is a 66 y.o. female with medical history significant of chronic low back pain, anxiety, depression, hypertension-recently had sorethroat (+strep) presented to the ED on 1/29 with lower ext weakness,tingling numbess that started in the lower extremities and subsequently started involving her upper extremities. Unable to elicit DTR in the lower ext. MRI of the entire spine negative. Concern for GBS-started on empiric IVIG by neurology.Developed respiratory failure and was intubated 2/1 and trasnferred to ICU.Marland Kitchen Trach 01/23/17.    PT Comments    Pt making steady progress.   Follow Up Recommendations  CIR (if able to wean. If not then [facility])     Equipment Recommendations  Other (comment) (to be assessed )    Recommendations for Other Services       Precautions / Restrictions Precautions Precautions: Fall Precaution Comments: trach/vent; monitor BP, flexi seal Required Braces or Orthoses: Other Brace/Splint Other Brace/Splint: B PRAFO  Restrictions Weight Bearing Restrictions: No    Mobility  Bed Mobility Overal bed mobility: Needs Assistance Bed Mobility: Supine to Sit;Sit to Supine     Supine to sit: Total assist;+2 for physical assistance Sit to supine: +2 for physical assistance;Total assist   General bed mobility comments: Assist to bring legs off of bed, elevate trunk into sitting and to bring hips to EOB  Transfers                    Ambulation/Gait                 Stairs            Wheelchair Mobility    Modified Rankin (Stroke Patients Only)       Balance Overall balance assessment: Needs assistance Sitting-balance support: Bilateral upper extremity supported;Feet supported Sitting balance-Leahy Scale: Poor Sitting balance - Comments: Pt sat x 15 minutes with mod to min A. Used  bil forearm support to help support pt. Attempted to have pt lean forward on forearms but pt would not lean anteriorly far enough Postural control: Posterior lean                          Cognition Arousal/Alertness: Awake/alert Behavior During Therapy: WFL for tasks assessed/performed Overall Cognitive Status: Difficult to assess                 General Comments: following 1 step commands consistently but incr time    Exercises General Exercises - Lower Extremity Ankle Circles/Pumps: Supine;10 reps;Both;AROM Short Arc Quad: AAROM;10 reps;Both;Supine Heel Slides: Both;Supine;AAROM;10 reps    General Comments        Pertinent Vitals/Pain Pain Assessment: Faces Faces Pain Scale: Hurts even more Pain Location: back with supine to sit to supine transition. Pt reports pain better once in sitting or supine Pain Descriptors / Indicators: Grimacing Pain Intervention(s): Limited activity within patient's tolerance;Monitored during session;Premedicated before session    Home Living                      Prior Function            PT Goals (current goals can now be found in the care plan section) Progress towards PT goals: Progressing toward goals;Goals met and updated - see care plan    Frequency    Min 3X/week      PT Plan  Current plan remains appropriate    Co-evaluation             End of Session Equipment Utilized During Treatment: Oxygen (30% trach collar) Activity Tolerance: Patient limited by fatigue;Patient tolerated treatment well Patient left: with call bell/phone within reach;in bed     Time: 1610-9604 PT Time Calculation (min) (ACUTE ONLY): 36 min  Charges:  $Therapeutic Activity: 23-37 mins                    G CodesShary Decamp [place] 02-06-2017, 2:40 PM [employer] PT [phone]

## 2017-01-29 DIAGNOSIS — I4891 Unspecified atrial fibrillation: Secondary | ICD-10-CM

## 2017-01-29 DIAGNOSIS — R1314 Dysphagia, pharyngoesophageal phase: Secondary | ICD-10-CM | POA: Diagnosis not present

## 2017-01-29 DIAGNOSIS — J9601 Acute respiratory failure with hypoxia: Secondary | ICD-10-CM

## 2017-01-29 DIAGNOSIS — G610 Guillain-Barre syndrome: Secondary | ICD-10-CM

## 2017-01-29 LAB — COMPREHENSIVE METABOLIC PANEL
ALT: 144 U/L — AB (ref 14–54)
AST: 92 U/L — ABNORMAL HIGH (ref 15–41)
Albumin: 2.6 g/dL — ABNORMAL LOW (ref 3.5–5.0)
Alkaline Phosphatase: 118 U/L (ref 38–126)
Anion gap: 9 (ref 5–15)
BUN: 45 mg/dL — ABNORMAL HIGH (ref 6–20)
CALCIUM: 10.4 mg/dL — AB (ref 8.9–10.3)
CHLORIDE: 117 mmol/L — AB (ref 101–111)
CO2: 24 mmol/L (ref 22–32)
CREATININE: 0.82 mg/dL (ref 0.44–1.00)
GFR calc non Af Amer: >60 mL/min (ref >60)
Glucose, Bld: 145 mg/dL — ABNORMAL HIGH (ref 65–99)
Potassium: 3.5 mmol/L (ref 3.5–5.1)
Sodium: 150 mmol/L — ABNORMAL HIGH (ref 135–145)
Total Bilirubin: 0.6 mg/dL (ref 0.3–1.2)
Total Protein: 8.5 g/dL — ABNORMAL HIGH (ref 6.5–8.1)

## 2017-01-29 LAB — MAGNESIUM: MAGNESIUM: 2.4 mg/dL (ref 1.7–2.4)

## 2017-01-29 LAB — CBC
HCT: 34.4 % — ABNORMAL LOW (ref 36.0–46.0)
Hemoglobin: 10.7 g/dL — ABNORMAL LOW (ref 12.0–15.0)
MCH: 27 pg (ref 26.0–34.0)
MCHC: 31.1 g/dL (ref 30.0–36.0)
MCV: 86.6 fL (ref 78.0–100.0)
PLATELETS: 512 10*3/uL — AB (ref 150–400)
RBC: 3.97 MIL/uL (ref 3.87–5.11)
RDW: 16.9 % — ABNORMAL HIGH (ref 11.5–15.5)
WBC: 14.9 10*3/uL — AB (ref 4.0–10.5)

## 2017-01-29 LAB — GLUCOSE, CAPILLARY
GLUCOSE-CAPILLARY: 160 mg/dL — AB (ref 65–99)
GLUCOSE-CAPILLARY: 144 mg/dL — AB (ref 65–99)
GLUCOSE-CAPILLARY: 117 mg/dL — AB (ref 65–99)
Glucose-Capillary: 138 mg/dL — ABNORMAL HIGH (ref 65–99)
Glucose-Capillary: 127 mg/dL — ABNORMAL HIGH (ref 65–99)

## 2017-01-29 MED ORDER — LORAZEPAM 2 MG/ML IJ SOLN
0.5000 mg | Freq: Two times a day (BID) | INTRAMUSCULAR | Status: DC | PRN
  Administered 2017-01-30 – 2017-01-31 (×3): 0.5 mg via INTRAVENOUS
  Filled 2017-01-29 (×3): qty 1

## 2017-01-29 NOTE — Progress Notes (Signed)
Dr Titus Mould feels pt ready for ltac. Spoke w [facility] on 2-14 ant they still have not heard back from ins. They hope to today. Spoke w husb and he is ok w [place] also getting [facility] to work on [place] to see which ltac we can get his wife in. Alerted [facility] rep [place] to start working on this also.

## 2017-01-29 NOTE — Evaluation (Signed)
[facility] - Evaluation Patient Details  Name: Joanne Morrison MRN: [id number] Date of Birth: 1951-07-19  Today's Date: 01/29/2017 Time: [id number] SLP Time Calculation (min) (ACUTE ONLY): 33 min  Past Medical History:  Past Medical History:  Diagnosis Date  . Anxiety   . Arthritis    back  . Chronic cystitis   . Chronic low back pain   . Depression   . Diverticulosis of colon   . Hemorrhoids   . History of colon polyps    09-24-2009  rectal hyperplastic polyp/   and 2016 non-cancerous  . History of endometriosis   . History of panic attacks   . Hyperlipidemia   . Osteopenia   . Pelvic pain   . Sensation of pressure in bladder area   . Wears glasses    Past Surgical History:  Past Surgical History:  Procedure Laterality Date  . BUNIONECTOMY Left 2004 approx  . CARPAL TUNNEL RELEASE Bilateral right 2014;  left 2007  . COLONOSCOPY W/ POLYPECTOMY  09/24/2009  . CYSTO WITH HYDRODISTENSION [name] 10/15/2016   Procedure: CYSTOSCOPY/HYDRODISTENSION AND MARCAINE INSTILLATION;  Surgeon: Rana Snare, MD;  Location: [facility];  Service: Urology;  Laterality: [name];  . CYSTO/  HYDRODISTENTION/  INSTILLATION THERAPY  1997 approx  . HYSTEROSCOPY W/D&C  11/01/2004   POLYPECTOMY  . IR GENERIC HISTORICAL  01/26/2017   IR GASTROSTOMY TUBE MOD SED 01/26/2017 Sandi Mariscal, MD [facility]INTER[facility]  . LAPAROSCOPIC VAGINAL HYSTERECTOMY WITH SALPINGO OOPHORECTOMY Bilateral 04/20/2012  . TUBAL LIGATION  yrs ago   HPI:  Patient is a 65 y.o. female with medical history significant of chronic low back pain, anxiety, depression, hypertension-recently had sorethroat (+strep) presented to the ED on 1/29 with lower ext weakness,tingling numbess that started in the lower extremities and subsequently started involving her upper extremities. Unable to elicit DTR in the lower ext. MRI of the entire spine negative. 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 Progressed to respiratory  failure and required intubation 2/1, trach 2/9, tolerating trach collar with vent on standby, per respiratory patient on 30% ATC pt tolerated well SATS 98%, HR 90, RR 23, NIF -22 cmH2O x3, VC 1.1L X3.    Assessment / Plan / Recommendation Clinical Impression  Pt demonstrates excellent initial tolerance of PMSV. Cuff had been deflated prior to session by RT, no significant secretions observed throught session. SLP placed PMSV for brief intervals, progressing to 5 minutes, with no signs of backpressure with removal, able to redirect air to upper airway with respiration and speech attempts. Pt remained relatively stable, though RR intermittently rose to low 30s x2. By end of session, rate stable in mid to upper 20s, consistent with baseline prior to placement. PMSV did not appear to significantly impact respiratory function and pt did not report discomfort. SLP elicited adduction with verbal cues to clear throat, cough, but with max verbal cues could not elicit voluntary sustained phonation or with speech tasks. Provided education to pt and husband and reviewed safety precautions (do not place PMSV as husband seemed overeager). Overall, pt expected to make progress with PMSV with further time off the vent. Recommend pt use PMSV with SLP only.     SLP Assessment  Patient needs continued Speech Lanaguage Pathology Services    Follow Up Recommendations  Other (comment) (TBD)    Frequency and Duration min 3x week  2 weeks    PMSV Trial PMSV was placed for: 5 minute intervals Able to redirect subglottic air through upper airway:  Yes Able to Attain Phonation: Yes Voice Quality: Hoarse Able to Expectorate Secretions: Yes Level of Secretion Expectoration with PMSV: Oral Breath Support for Phonation: Severely decreased Intelligibility: Intelligibility reduced Word: 0-24% accurate Phrase: 0-24% accurate Sentence: 0-24% accurate Conversation: Not tested Respirations During Trial: (!) 33 (17-33) SpO2  During Trial: 97 % Pulse During Trial: 88 Behavior: Alert;Responsive to questions   Tracheostomy Tube  Additional Tracheostomy Tube Assessment Trach Collar Period: 4 hours Secretion Description: not observed Frequency of Tracheal Suctioning: frequent    Vent Dependency  Vent Dependent: Yes FiO2 (%): 35 % Nocturnal Vent: Yes    Cuff Deflation Trial  GO Tolerated Cuff Deflation: Yes (deflated by respiratory prior to SLP entry) Behavior: Alert;Cooperative;Responsive to questions         Aliene Altes 01/29/2017, 11:08 AM  Deneise Lever, MS CF-SLP Speech-Language Pathologist [phone]

## 2017-01-29 NOTE — Progress Notes (Signed)
Vent on standby placed patient on 30% ATC pt tolerated well SATS 98%, HR 90, RR 23, NIF -22 cmH2O x3, VC 1.1L X3, will continue to monitor patient.

## 2017-01-29 NOTE — Progress Notes (Signed)
PROGRESS NOTE    Joanne Morrison  HER:[id number] DOB: 1951-08-25 DOA: 01/12/2017 PCP: Jerlyn Ly, MD   Brief Narrative:  66 y.o. female PMHX Anxiety,Depression,History of panic attacks,Chronic low back pain,Chronic cystitis,History of endometriosis, HTN  Presents to the hospital today for evaluation of multiple complaints as stated above. Apparently approximately a week or so ago, patient was diagnosed with strep throat and she completed a course of Zithromax. A few days back, she started having pain along with tingling and numbness in her bilateral thigh area, but this gradually progressed up and now involves the back, bilateral shoulder girdle areas. Patient claims that the pain is so intense that it is now difficult for her to even turn around in bed. She claims that she over the past few days has started to have predominantly left upper and left lower extremity weakness-but feels weak all over.   Subjective: 2/15 is alert currently on vent follows all commands.       Assessment & Plan:   Active Problems:   Depression   Chronic low back pain   Weakness   Hypokalemia   GBS (Guillain Barre syndrome) (HCC)   Quadriplegia and quadriparesis (HCC)   Acute respiratory failure (HCC)   Hyponatremia with decreased serum osmolality   Hyponatremia   Pressure injury of skin   Pneumonia   SOB (shortness of breath)   Weakness of right lower extremity   Encounter for central line placement   Encounter for feeding tube placement   History of ETT   Acute respiratory failure with hypoxia due to neuromuscular weakness presumed r/t GBS -Vent and trach care per PCCM - weaning attempts ongoing  -Patient tolerated trach collar majority of the day before having to be placed back on vent  Guillain Barre syndrome  -IVIG course completed per Neuro - no plasma for plasma exchange unless 2-4 weeks go by and no progress -2/15 NIF; -22 cmH2O -2/15 VC; 1.1 L  A-fib with RVR / Sinus  Tachycardia   Hx HTN   Hypernatremia   Hypokalemia   Transaminitis   Dysphagia - S/P PEG  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)   DVT prophylaxis: Subcutaneous heparin Code Status: Full Family Communication: Husband at bedside Disposition Plan: CIR   Consultants:  Neuro PCCM Opthalmology    Procedures/Significant Events:  1/29 admit to [facility]  1/30 LP - IVIG initiated 2/1 intubated 2/9 trach   VENTILATOR SETTINGS: Trach collar: 35% FiO2  Mode: PRVC  Vt Set: 450 ml; Set rate: 20 FiO2 30% PEEP 5     Cultures   Antimicrobials: Anti-infectives    Start     Stop   01/26/17 1230  ceFAZolin (ANCEF) IVPB 2g/100 mL premix     01/26/17 1256   01/17/17 1400  piperacillin-tazobactam (ZOSYN) IVPB 3.375 g     01/24/17 2359   01/17/17 1045  vancomycin (VANCOCIN) IVPB 1000 mg/200 mL premix     01/24/17 2256   01/15/17 1600  fluconazole (DIFLUCAN) IVPB 200 mg  Status:  Discontinued     01/22/17 0946   01/15/17 1600  Ampicillin-Sulbactam (UNASYN) 3 g in sodium chloride 0.9 % 100 mL IVPB  Status:  Discontinued     01/17/17 0855   01/14/17 1000  fluconazole (DIFLUCAN) tablet 200 mg  Status:  Discontinued     01/15/17 1454       Devices    LINES / TUBES:      Continuous Infusions:   Objective: Vitals:   01/29/17 1529  01/29/17 1600 01/29/17 1700 01/29/17 1705  BP:  (!) 160/111 (!) 162/89   Pulse:  92 85   Resp:  (!) 28 (!) 21   Temp:  98.6 F (37 C)    TempSrc:  Axillary    SpO2: 98% 99% 97% 98%  Weight:      Height:        Intake/Output Summary (Last 24 hours) at 01/29/17 1759 Last data filed at 01/29/17 1700  Gross per 24 hour  Intake             1140 ml  Output             1335 ml  Net             -195 ml   Filed Weights   01/27/17 0411 01/28/17 0400 01/29/17 0336  Weight: 68.8 kg (151 lb 10.8 oz) 66.3 kg (146 lb 2.6 oz) 67.7 kg (149 lb 4 oz)    Examination:  General:  alert currently on  vent follows all commands, positive  acute respiratory distress Eyes: negative scleral hemorrhage, negative anisocoria, negative icterus ENT: Negative Runny nose, negative gingival bleeding, Neck:  Negative scars, masses, torticollis, lymphadenopathy, JVD, tracheostomy in place, negative sign of infection Lungs: Clear to auscultation bilaterally without wheezes or crackles Cardiovascular: Regular rate and rhythm without murmur gallop or rub normal S1 and S2 Abdomen: negative abdominal pain, nondistended, positive soft, bowel sounds, no rebound, no ascites, no appreciable mass Extremities: No significant cyanosis, clubbing, or edema bilateral lower extremities Skin: Negative rashes, lesions, ulcers Psychiatric:  Negative depression, negative anxiety, negative fatigue, negative mania  Central nervous system:  Cranial nerves II through XII intact, tongue/uvula midline,  bilateral lower extremities strength 2/5, bilateral upper extremity strength 2-3/5  .     Data Reviewed: Care during the described time interval was provided by me .  I have reviewed this patient's available data, including medical history, events of note, physical examination, and all test results as part of my evaluation. I have personally reviewed and interpreted all radiology studies.  CBC:  Recent Labs Lab 01/25/17 0422 01/26/17 0515 01/27/17 0141 01/28/17 0307 01/29/17 0229  WBC 15.1* 13.6* 13.3* 13.3* 14.9*  NEUTROABS  --   --  10.3* 10.3*  --   HGB 8.9* 8.9* 9.5* 10.3* 10.7*  HCT 28.1* 28.2* 30.1* 33.4* 34.4*  MCV 82.4 83.4 83.4 85.2 86.6  PLT 355 328 426* 505* 875*   Basic Metabolic Panel:  Recent Labs Lab 01/23/17 0943 01/24/17 0441 01/25/17 0422 01/25/17 1709 01/26/17 0515 01/27/17 0141 01/28/17 0307 01/29/17 0229  NA 136 138 140 143 144 145 148* 150*  K 3.7 3.1* 3.5 4.0 3.4* 3.5 3.1* 3.5  CL 97* 100* 105 107 110 109 111 117*  CO2 _0 GLUCOSE 112* 145* 142* 149* 102* 112*  147* 145*  BUN 34* 24* 25* 29* 33* 33* 46* 45*  CREATININE 0.85 0.71 0.74 0.79 0.70 0.74 1.02* 0.82  CALCIUM 9.1 8.5* 8.9 9.0 9.2 9.8 10.6* 10.4*  MG  --  2.1 1.9 2.6* 2.3  --  2.3 2.4  PHOS 4.5 3.7 3.5  --  3.2  --  4.1  --    GFR: Estimated Creatinine Clearance: 62.5 mL/min (by C-G formula based on SCr of 0.82 mg/dL). Liver Function Tests:  Recent Labs Lab 01/24/17 0441 01/25/17 0422 01/26/17 0515 01/28/17 0307 01/29/17 0229  AST  --  65*  --  112* 92*  ALT  --  89*  --  144* 144*  ALKPHOS  --  180*  --  135* 118  BILITOT  --  0.3  --  0.7 0.6  PROT  --  7.2  --  8.8* 8.5*  ALBUMIN 1.7* 1.8* 2.0* 2.5* 2.6*   No results for input(s): LIPASE, AMYLASE in the last 168 hours. No results for input(s): AMMONIA in the last 168 hours. Coagulation Profile:  Recent Labs Lab 01/23/17 0334  INR 1.05   Cardiac Enzymes:  Recent Labs Lab 01/26/17 1519  TROPONINI <0.03   BNP (last 3 results) No results for input(s): PROBNP in the last 8760 hours. HbA1C: No results for input(s): HGBA1C in the last 72 hours. CBG:  Recent Labs Lab 01/28/17 2321 01/29/17 0331 01/29/17 0757 01/29/17 1230 01/29/17 1643  GLUCAP 140* 160* 138* 144* 117*   Lipid Profile: No results for input(s): CHOL, HDL, LDLCALC, TRIG, CHOLHDL, LDLDIRECT in the last 72 hours. Thyroid Function Tests: No results for input(s): TSH, T4TOTAL, FREET4, T3FREE, THYROIDAB in the last 72 hours. Anemia Panel: No results for input(s): VITAMINB12, FOLATE, FERRITIN, TIBC, IRON, RETICCTPCT in the last 72 hours. Urine analysis:    Component Value Date/Time   COLORURINE YELLOW 01/15/2017 [place] 01/15/2017 1359   LABSPEC 1.021 01/15/2017 1359   PHURINE 5.0 01/15/2017 1359   GLUCOSEU >=500 (A) 01/15/2017 1359   GLUCOSEU NEGATIVE 12/29/2006 0738   HGBUR NEGATIVE 01/15/2017 1359   HGBUR negative 12/05/2009 0933   BILIRUBINUR NEGATIVE 01/15/2017 1359   BILIRUBINUR neg 07/08/2012 1218   KETONESUR 80  (A) 01/15/2017 1359   PROTEINUR 100 (A) 01/15/2017 1359   UROBILINOGEN 0.2 07/08/2012 1218   UROBILINOGEN 0.2 12/05/2009 0933   NITRITE NEGATIVE 01/15/2017 1359   LEUKOCYTESUR NEGATIVE 01/15/2017 1359   Sepsis Labs: _0 (procalcitonin:4,lacticidven:4)  )No results found for this or any previous visit (from the past 240 hour(s)).       Radiology Studies: No results found.      Scheduled Meds: . chlorhexidine gluconate (MEDLINE KIT)  15 mL Mouth Rinse BID  . erythromycin  1 application Both Eyes H2Z  . feeding supplement (PRO-STAT SUGAR FREE 64)  30 mL Per Tube BID  . feeding supplement (VITAL AF 1.2 CAL)  1,000 mL Per Tube Q24H  . fentaNYL  50 mcg Transdermal Q72H  . free water  100 mL Per Tube Q8H  . heparin subcutaneous  5,000 Units Subcutaneous Q8H  . insulin aspart  0-15 Units Subcutaneous Q4H  . lidocaine  1 patch Transdermal Q24H  . mouth rinse  15 mL Mouth Rinse 10 times per day  . pantoprazole sodium  40 mg Per Tube Q1200  . sertraline  50 mg Oral QHS  . sodium chloride flush  10-40 mL Intracatheter Q12H  . sodium chloride flush  3 mL Intravenous Q12H   Continuous Infusions:   LOS: 16 days    Time spent: 40 minutes    Winfred Redel, Geraldo Docker, MD [facility] Pager [phone]   If 7PM-7AM, please contact night-coverage [email or web address] Password [facility] 01/29/2017, 5:59 PM

## 2017-01-29 NOTE — Progress Notes (Signed)
Placed patient back on vent to rest  

## 2017-01-30 DIAGNOSIS — R1314 Dysphagia, pharyngoesophageal phase: Secondary | ICD-10-CM

## 2017-01-30 DIAGNOSIS — E871 Hypo-osmolality and hyponatremia: Secondary | ICD-10-CM | POA: Diagnosis not present

## 2017-01-30 DIAGNOSIS — R29898 Other symptoms and signs involving the musculoskeletal system: Secondary | ICD-10-CM | POA: Diagnosis not present

## 2017-01-30 DIAGNOSIS — R531 Weakness: Secondary | ICD-10-CM | POA: Diagnosis not present

## 2017-01-30 DIAGNOSIS — E876 Hypokalemia: Secondary | ICD-10-CM | POA: Diagnosis not present

## 2017-01-30 LAB — GLUCOSE, CAPILLARY
GLUCOSE-CAPILLARY: 120 mg/dL — AB (ref 65–99)
GLUCOSE-CAPILLARY: 160 mg/dL — AB (ref 65–99)
Glucose-Capillary: 129 mg/dL — ABNORMAL HIGH (ref 65–99)
Glucose-Capillary: 129 mg/dL — ABNORMAL HIGH (ref 65–99)
Glucose-Capillary: 132 mg/dL — ABNORMAL HIGH (ref 65–99)
Glucose-Capillary: 139 mg/dL — ABNORMAL HIGH (ref 65–99)
Glucose-Capillary: 139 mg/dL — ABNORMAL HIGH (ref 65–99)

## 2017-01-30 MED ORDER — DEXTROSE 5 % IV SOLN
INTRAVENOUS | Status: DC
  Administered 2017-01-30 – 2017-02-01 (×4): via INTRAVENOUS

## 2017-01-30 MED ORDER — LORAZEPAM 1 MG PO TABS
1.0000 mg | ORAL_TABLET | Freq: Two times a day (BID) | ORAL | Status: DC
  Administered 2017-01-30 – 2017-02-04 (×10): 1 mg
  Filled 2017-01-30 (×11): qty 1

## 2017-01-30 MED ORDER — FREE WATER
200.0000 mL | Freq: Three times a day (TID) | Status: DC
  Administered 2017-01-30 – 2017-02-01 (×5): 200 mL

## 2017-01-30 NOTE — Progress Notes (Signed)
Name: Joanne Morrison MRN: [id number] DOB: 04-05-1951    ADMISSION DATE:  01/12/2017 CONSULTATION DATE:  01/15/2017  REFERRING MD :  Sloan Leiter ([facility])   CHIEF COMPLAINT:  Dyspnea, potential for respiratory compromise   BRIEF PATIENT DESCRIPTION:  66 yo female presented with weakness and myalgias for 3 to 4 days.  She had recently completed therapy for Strep throat.  Concern was that she developed Guillain Barre syndrome, and she was started on IVIG 1/30.  She progressed to respiratory failure on 2/01 and required intubation.     SUBJECTIVE:  Poor use pMV 8 hours trach collar 2/15  VITAL SIGNS: BP (!) 133/93   Pulse 82   Temp 98.2 F (36.8 C) (Oral)   Resp (!) 27   Ht 5\' 2"  (1.575 m)   Wt 67.5 kg (148 lb 13 oz)   SpO2 97%   BMI 27.22 kg/m   INTAKE / OUTPUT: I/O last 3 completed shifts: In: 1580 [I.V.:10; Other:70; NG/GT:1500] Out: 1910 [Urine:1760; Drains:150]  General: Acutely ill appearing female, on TC, no distress Neuro: Diffusely weak unchanged Eyes: Pupils reactive, less chelral erythema Cardiac: RRR, Nl S1/S2 Chest: coarse slight Abd: Soft, NT, ND and +BS Ext: 0+ edema Skin: no rashes  LABS:  BMET  Recent Labs Lab 01/27/17 0141 01/28/17 0307 01/29/17 0229  NA 145 148* 150*  K 3.5 3.1* 3.5  CL 109 111 117*  CO2 24 26 24   BUN 33* 46* 45*  CREATININE 0.74 1.02* 0.82  GLUCOSE 112* 147* 145*   Electrolytes  Recent Labs Lab 01/25/17 0422  01/26/17 0515 01/27/17 0141 01/28/17 0307 01/29/17 0229  CALCIUM 8.9  < > 9.2 9.8 10.6* 10.4*  MG 1.9  < > 2.3  --  2.3 2.4  PHOS 3.5  --  3.2  --  4.1  --   < > = values in this interval not displayed. CBC  Recent Labs Lab 01/27/17 0141 01/28/17 0307 01/29/17 0229  WBC 13.3* 13.3* 14.9*  HGB 9.5* 10.3* 10.7*  HCT 30.1* 33.4* 34.4*  PLT 426* 505* 512*   Coag's No results for input(s): APTT, INR in the last 168 hours. Sepsis Markers No results for input(s): LATICACIDVEN, PROCALCITON, O2SATVEN in  the last 168 hours. ABG No results for input(s): PHART, PCO2ART, PO2ART in the last 168 hours. Liver Enzymes  Recent Labs Lab 01/25/17 0422 01/26/17 0515 01/28/17 0307 01/29/17 0229  AST 65*  --  112* 92*  ALT 89*  --  144* 144*  ALKPHOS 180*  --  135* 118  BILITOT 0.3  --  0.7 0.6  ALBUMIN 1.8* 2.0* 2.5* 2.6*   Cardiac Enzymes  Recent Labs Lab 01/26/17 1519  TROPONINI <0.03    Glucose  Recent Labs Lab 01/29/17 1230 01/29/17 1643 01/29/17 1929 01/30/17 0020 01/30/17 0347 01/30/17 0758  GLUCAP 144* 117* 127* 139* 139* 129*   Imaging No results found. SIGNIFICANT EVENTS  IVIG 1/30--> completed coiurse per neuro  STUDIES:  MR brain 1/29>>>negative LP 1/30>>>glucose 55, RBC 6, WBC 3, protein 79  CULTURES: CSF 1/30>>>negative BC x 2 2/1>>> negative Respiratory culture 2/3 >>> rare gpc pairs>>>normal flora  ANTIBIOTICS: Diflucan 1/31 >>2/8 Unasyn 2/1>>>2/3 vanc 2/3>>>2/10 Zosyn 2/3>>>2/10  LINES/TUBES: ETT 2/1>>>2/9 Trach [employer]) 2/9>>> Right IJ CVL 2/1>>>2/12  ASSESSMENT / PLAN:  PULMONARY Acute respiratory failure - in setting neuromuscular weakness presumed r/t guillane barre.   ?aspiration PNA P:   repeat TC goal 12 hours today, nocturnal rest PMV failed, slow re attempts next week May  need 4 trach in future to have success Cuff down on trach  NEUROLOGIC Guillain barre syndrome  Sedation needs on vent  A-fib with RVR Depression ? P:   RASS goal:0 Na treat to correct fentanyl patch consider reductions Ssri, connsider increase in 2 weeks PT / OT attemptes on going May need Ambien Consider scheduled ativan, anxiety increased   will follow up Monday  FAMILY  - Updates:  To husband by me today  - Inter-disciplinary family meet or Palliative Care meeting due by: 2/8  Lavon Paganini. Titus Mould, MD, [place] Pgr: [facility]

## 2017-01-30 NOTE — Consult Note (Signed)
Opthalmology follow-up note:   Per husband, patient doing a little better with right eye.  Getting EES q 4 hours.  Alternating eye patch q 4 hours.  Patient is currently patched OD.  I removed patch OD  Conjunctival injection is greatly improved OD.  Still +3 OS  Cornea OD:  Inferior punctate staining and haze.  Much improved.  Cornea OS:  Still with large epithelial defect OS inferiorly  I spoke to her nurse.  She says that some of the nurses are just putting the ointment on the patch rather than in the eyes.  I EXPLAINED THAT IT IS CRUCIAL TO PUT THE OINTMENT IN THE EYES.  Plan:  Continue Erythromycin ophthalmic ung q 4 hours OU.  Will stop patching OD for now.  Continuously patch OS.  Change patch q 4hours when she gets more erythromycin.  Orders conveyed to nurse verbally.  --Luberta Mutter, MD

## 2017-01-30 NOTE — Progress Notes (Signed)
NIF -15, VC 617mls. Patient placed on aerosol trach collar at this time. No distress noted pt tolerating well.

## 2017-01-30 NOTE — Progress Notes (Signed)
Physical Therapy Treatment Patient Details Name: Joanne Morrison MRN: [id number] DOB: [birth year] Today's Date: 01/30/2017    History of Present Illness Patient is a 66 y.o. female with medical history significant of chronic low back pain, anxiety, depression, hypertension-recently had sorethroat (+strep) presented to the ED on 1/29 with lower ext weakness,tingling numbess that started in the lower extremities and subsequently started involving her upper extremities. Unable to elicit DTR in the lower ext. MRI of the entire spine negative. Concern for GBS-started on empiric IVIG by neurology.Developed respiratory failure and was intubated 2/1 and trasnferred to ICU.Marland Kitchen Trach 01/23/17.    PT Comments    Pt presented supine in bed with HOB elevated, awake and willing to participate in therapy session. Pt very motivated to remain sitting upright at EOB and participate in therapeutic exercises (see below). PT also encouraged pt and pt's husband to allow nursing staff to assist pt to recliner chair later today with use of lift equipment. Both agreeable to plan and RN notified. All VSS throughout. Pt would continue to benefit from skilled physical therapy services at this time while admitted and after d/c to address her limitations in order to improve her overall safety and independence with functional mobility.     Follow Up Recommendations  CIR (if able to wean, if not LTACH)     Equipment Recommendations  Other (comment) (TBD, defer to next venue)    Recommendations for Other Services       Precautions / Restrictions Precautions Precautions: Fall Precaution Comments: trach/vent; monitor BP, flexi seal Required Braces or Orthoses: Other Brace/Splint Other Brace/Splint: B PRAFO  Restrictions Weight Bearing Restrictions: No    Mobility  Bed Mobility Overal bed mobility: Needs Assistance Bed Mobility: Supine to Sit;Sit to Supine;Rolling Rolling: Max assist;+2 for physical assistance    Supine to sit: Total assist;+2 for physical assistance Sit to supine: +2 for physical assistance;Total assist   General bed mobility comments: total A for all aspects with use of bed pads to position pt's hips at EOB  Transfers                    Ambulation/Gait                 Stairs            Wheelchair Mobility    Modified Rankin (Stroke Patients Only)       Balance Overall balance assessment: Needs assistance Sitting-balance support: Feet supported;Single extremity supported Sitting balance-Leahy Scale: Poor Sitting balance - Comments: pt reliant on at least single UE support on bed, progressing from max A to min guard. Pt tolerated sitting EOB for ~20 minutes while participating in therapeutic exercises Postural control: Posterior lean                          Cognition Arousal/Alertness: Awake/alert Behavior During Therapy: WFL for tasks assessed/performed Overall Cognitive Status: Difficult to assess                 General Comments: following 1 step commands consistently but with increased time    Exercises General Exercises - Lower Extremity Ankle Circles/Pumps: Seated;20 reps;Both;AROM;AAROM Long Arc Quad: AROM;Both;20 reps;Seated Hip Flexion/Marching: AAROM;Both;20 reps;Seated Other Exercises Other Exercises: Seated lateral weight shifting for trunk strengthening and postural control, x5 reps to each side with min guard to min A    General Comments        Pertinent Vitals/Pain Pain Assessment: Faces Faces Pain  Scale: Hurts little more Pain Location: neck  Pain Descriptors / Indicators: Grimacing Pain Intervention(s): Repositioned;Monitored during session;Limited activity within patient's tolerance    Home Living                      Prior Function            PT Goals (current goals can now be found in the care plan section) Acute Rehab PT Goals Patient Stated Goal: unable to state. Per family -  to go to CIR PT Goal Formulation: Patient unable to participate in goal setting Time For Goal Achievement: 02/02/17 Potential to Achieve Goals: Fair Progress towards PT goals: Progressing toward goals    Frequency    Min 3X/week      PT Plan Current plan remains appropriate    Co-evaluation             End of Session Equipment Utilized During Treatment: Oxygen (30% trach collar) Activity Tolerance: Patient tolerated treatment well Patient left: in bed;with call bell/phone within reach;with family/visitor present     Time: [id number] PT Time Calculation (min) (ACUTE ONLY): 33 min  Charges:  $Therapeutic Exercise: 8-22 mins $Therapeutic Activity: 8-22 mins                    G CodesClearnce Sorrel Khayden Herzberg Feb 27, 2017, 12:15 PM Sherie Don, [place], DPT [phone]

## 2017-01-30 NOTE — Progress Notes (Signed)
[place] TEAM 1 - Stepdown/ICU TEAM  Bonnell Placzek  [id number] DOB: 02-13-1951 DOA: 01/12/2017 PCP: Jerlyn Ly, MD    Brief Narrative:  66 yo female presented with weakness and myalgias for 3 to 4 days. She had recently completed therapy for Strep throat. Concern was that she developed Guillain Barre syndrome, and she was started on IVIG 1/30. She progressed to respiratory failure on 2/01 and required intubation.  Significant Events: 1/29 admit to [facility]  1/30 LP - IVIG initiated 2/1 intubated 2/9 trach  Subjective: The patient was seen by the [facility] a.m. team today who attended to all active acute issues.  TR[facility] will follow up in the morning.  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 rate (!) 26, height _0  (1.575 m), weight 67.5 kg (148 lb 13 oz), SpO2 98 %.  Intake/Output Summary (Last 24 hours) at 01/30/17 1458 Last data filed at 01/30/17 1400  Gross per 24 hour  Intake             1150 ml  Output             1225 ml  Net              -75 ml   Filed Weights   01/28/17 0400 01/29/17 0336 01/30/17 0350  Weight: 66.3 kg (146 lb 2.6 oz) 67.7 kg (149 lb 4 oz) 67.5 kg (148 lb 13 oz)     Examination: General: No acute respiratory distress Lungs: Clear to auscultation bilaterally without wheezes or crackles Cardiovascular: Regular rate and rhythm without murmur gallop or rub normal S1 and S2 Abdomen: Nontender, nondistended, soft, bowel sounds positive, no rebound, no ascites, no appreciable mass Extremities: No significant cyanosis, clubbing, or edema bilateral lower extremities  CBC:  Recent Labs Lab 01/25/17 0422 01/26/17 0515 01/27/17 0141 01/28/17 0307 01/29/17 0229  WBC 15.1* 13.6* 13.3* 13.3* 14.9*  NEUTROABS  --   --  10.3* 10.3*  --   HGB 8.9* 8.9* 9.5* 10.3* 10.7*  HCT 28.1* 28.2* 30.1* 33.4* 34.4*  MCV 82.4 83.4 83.4 85.2 86.6  PLT 355 328 426* 505* 481*   Basic Metabolic Panel:  Recent Labs Lab 01/24/17 0441 01/25/17 0422 01/25/17 1709 01/26/17 0515 01/27/17 0141 01/28/17 0307 01/29/17 0229  NA 138 140 143 144 145 148* 150*  K 3.1* 3.5 4.0 3.4* 3.5 3.1* 3.5  CL 100* 105 107 110 109 111 117*  CO2 _1 GLUCOSE 145* 142* 149* 102* 112* 147* 145*  BUN 24* 25* 29* 33* 33* 46* 45*  CREATININE 0.71 0.74 0.79 0.70 0.74 1.02* 0.82  CALCIUM 8.5* 8.9 9.0 9.2 9.8 10.6* 10.4*  MG 2.1 1.9 2.6* 2.3  --  2.3 2.4  PHOS 3.7 3.5  --  3.2  --  4.1  --    GFR: Estimated Creatinine Clearance: 62.5 mL/min (by C-G formula based on SCr of 0.82 mg/dL).  Liver Function Tests:  Recent Labs Lab 01/24/17 0441 01/25/17 0422 01/26/17 0515 01/28/17 0307 01/29/17 0229  AST  --  65*  --  112* 92*  ALT  --  89*  --  144* 144*  ALKPHOS  --  180*  --  135* 118  BILITOT  --  0.3  --  0.7 0.6  PROT  --  7.2  --  8.8* 8.5*  ALBUMIN 1.7* 1.8* 2.0* 2.5* 2.6*   Cardiac Enzymes:  Recent Labs Lab 01/26/17 1519  TROPONINI <0.03    HbA1C: Hgb A1c MFr Bld  Date/Time Value Ref Range Status  12/05/2009 01:06 PM 5.5 4.6 - 6.5 % Final    Comment:    See lab report for associated comment(s)  11/22/2008 12:00 AM 5.5 4.6 - 6.0 % Final     Comment:    See lab report for associated comment(s)    CBG:  Recent Labs Lab 01/29/17 1929 01/30/17 0020 01/30/17 0347 01/30/17 0758 01/30/17 1159  GLUCAP 127* 139* 139* 129* 129*    Scheduled Meds: . chlorhexidine gluconate (MEDLINE KIT)  15 mL Mouth Rinse BID  . erythromycin  1 application Both Eyes V7K  . feeding supplement (PRO-STAT SUGAR FREE 64)  30 mL Per Tube BID  . feeding supplement (VITAL AF 1.2 CAL)  1,000 mL Per Tube Q24H  . fentaNYL  50 mcg Transdermal Q72H  . free water  100 mL Per Tube Q8H  . heparin subcutaneous  5,000 Units Subcutaneous Q8H  . insulin aspart  0-15 Units Subcutaneous Q4H  . lidocaine  1 patch Transdermal Q24H  . LORazepam  1 mg Per Tube Q12H  . mouth rinse  15 mL Mouth Rinse 10 times per day  . pantoprazole sodium  40 mg Per Tube Q1200  . sertraline  50 mg Oral QHS   Continuous Infusions: . dextrose 50 mL/hr at 01/30/17 1211     LOS: 17 days   Cherene Altes, MD [facility] Office  [phone] Pager - Text Page per Shea Evans as per below:  On-Call/Text Page:      [email or web address]      password [email or web address]  If 7PM-7AM, please contact night-coverage [email or web address] Password [email or web address] 01/30/2017, 2:58 PM

## 2017-01-30 NOTE — Progress Notes (Signed)
Focus of session on UE exercise at bed level. More facial expressions today and good participation.    01/30/17 1300  OT Visit Information  Last OT Received On 01/30/17  Assistance Needed +2 (+2 for bed level ex)  History of Present Illness Patient is a 66 y.o. female with medical history significant of chronic low back pain, anxiety, depression, hypertension-recently had sorethroat (+strep) presented to the ED on 1/29 with lower ext weakness,tingling numbess that started in the lower extremities and subsequently started involving her upper extremities. Unable to elicit DTR in the lower ext. MRI of the entire spine negative. Concern for GBS-started on empiric IVIG by neurology.Developed respiratory failure and was intubated 2/1 and trasnferred to ICU.Marland Kitchen Trach 01/23/17.  Precautions  Precautions Fall  Precaution Comments trach/vent; monitor BP, flexi seal  Required Braces or Orthoses Other Brace/Splint  Other Brace/Splint B PRAFO   Pain Assessment  Pain Assessment No/denies pain  Cognition  Arousal/Alertness Awake/alert  Behavior During Therapy WFL for tasks assessed/performed  Overall Cognitive Status Difficult to assess  General Comments following 1 step commands consistently   Difficult to assess due to Tracheostomy  ADL  General ADL Comments Total A at this time  Bed Mobility  Overal bed mobility Needs Assistance  Bed Mobility Rolling  Rolling Max assist;+2 for physical assistance  General bed mobility comments total assist and use of bed pad  Restrictions  Weight Bearing Restrictions No  Vision- Assessment  Additional Comments patched L eye  Exercises  Exercises General Upper Extremity  General Exercises - Upper Extremity  Shoulder Flexion AAROM;Both;10 reps;Supine  Shoulder Extension AAROM;Both;10 reps;Supine  Shoulder ABduction AAROM;Both;5 reps;Supine  Shoulder ADduction AAROM;Both;10 reps;Supine  Elbow Flexion AROM;AAROM;Both;10 reps;Supine  Elbow Extension  AAROM;Both;Supine;AROM;10 reps  Wrist Flexion Both;Supine;AAROM;10 reps  Wrist Extension AAROM;Both;Supine;10 reps  Digit Composite Flexion AROM;Supine;Both;10 reps  Composite Extension Both;Supine;AROM;10 reps  OT - End of Session  Equipment Utilized During Treatment Oxygen  Activity Tolerance Patient tolerated treatment well  Patient left in bed;with call bell/phone within reach;with family/visitor present  Nurse Communication (requests suctioning )  OT Assessment/Plan  OT Plan Discharge plan needs to be updated  OT Frequency (ACUTE ONLY) Min 2X/week  Follow Up Recommendations LTACH;Supervision/Assistance - 24 hour  OT Goal Progression  Progress towards OT goals Progressing toward goals  Acute Rehab OT Goals  Time For Goal Achievement 02/05/17  Potential to Achieve Goals Good  OT Time Calculation  OT Start Time (ACUTE ONLY) 1225  OT Stop Time (ACUTE ONLY) 1245  OT Time Calculation (min) 20 min  OT General Charges  $OT Visit 1 Procedure  OT Treatments  $Therapeutic Exercise 8-22 mins  01/30/2017 Nestor Lewandowsky, OTR/L Pager: [phone]

## 2017-01-31 DIAGNOSIS — J9601 Acute respiratory failure with hypoxia: Secondary | ICD-10-CM | POA: Diagnosis not present

## 2017-01-31 DIAGNOSIS — E876 Hypokalemia: Secondary | ICD-10-CM | POA: Diagnosis not present

## 2017-01-31 DIAGNOSIS — B370 Candidal stomatitis: Secondary | ICD-10-CM | POA: Diagnosis not present

## 2017-01-31 DIAGNOSIS — I4892 Unspecified atrial flutter: Secondary | ICD-10-CM | POA: Diagnosis not present

## 2017-01-31 DIAGNOSIS — I959 Hypotension, unspecified: Secondary | ICD-10-CM | POA: Diagnosis not present

## 2017-01-31 DIAGNOSIS — G8250 Quadriplegia, unspecified: Secondary | ICD-10-CM | POA: Diagnosis not present

## 2017-01-31 DIAGNOSIS — G610 Guillain-Barre syndrome: Secondary | ICD-10-CM | POA: Diagnosis not present

## 2017-01-31 DIAGNOSIS — I480 Paroxysmal atrial fibrillation: Secondary | ICD-10-CM | POA: Diagnosis not present

## 2017-01-31 DIAGNOSIS — J690 Pneumonitis due to inhalation of food and vomit: Secondary | ICD-10-CM | POA: Diagnosis not present

## 2017-01-31 DIAGNOSIS — R1314 Dysphagia, pharyngoesophageal phase: Secondary | ICD-10-CM | POA: Diagnosis not present

## 2017-01-31 DIAGNOSIS — I4891 Unspecified atrial fibrillation: Secondary | ICD-10-CM | POA: Diagnosis not present

## 2017-01-31 DIAGNOSIS — E871 Hypo-osmolality and hyponatremia: Secondary | ICD-10-CM | POA: Diagnosis not present

## 2017-01-31 DIAGNOSIS — J9600 Acute respiratory failure, unspecified whether with hypoxia or hypercapnia: Secondary | ICD-10-CM | POA: Diagnosis not present

## 2017-01-31 DIAGNOSIS — E870 Hyperosmolality and hypernatremia: Secondary | ICD-10-CM | POA: Diagnosis not present

## 2017-01-31 LAB — COMPREHENSIVE METABOLIC PANEL
ALT: 90 U/L — AB (ref 14–54)
AST: 51 U/L — ABNORMAL HIGH (ref 15–41)
Albumin: 2.4 g/dL — ABNORMAL LOW (ref 3.5–5.0)
Alkaline Phosphatase: 98 U/L (ref 38–126)
Anion gap: 10 (ref 5–15)
BUN: 32 mg/dL — ABNORMAL HIGH (ref 6–20)
CALCIUM: 9.6 mg/dL (ref 8.9–10.3)
CHLORIDE: 110 mmol/L (ref 101–111)
CO2: 26 mmol/L (ref 22–32)
CREATININE: 0.65 mg/dL (ref 0.44–1.00)
GFR calc Af Amer: >60 mL/min (ref >60)
Glucose, Bld: 162 mg/dL — ABNORMAL HIGH (ref 65–99)
Potassium: 3.2 mmol/L — ABNORMAL LOW (ref 3.5–5.1)
Sodium: 146 mmol/L — ABNORMAL HIGH (ref 135–145)
Total Bilirubin: 0.5 mg/dL (ref 0.3–1.2)
Total Protein: 7.7 g/dL (ref 6.5–8.1)

## 2017-01-31 LAB — GLUCOSE, CAPILLARY
GLUCOSE-CAPILLARY: 182 mg/dL — AB (ref 65–99)
GLUCOSE-CAPILLARY: 134 mg/dL — AB (ref 65–99)
GLUCOSE-CAPILLARY: 115 mg/dL — AB (ref 65–99)
Glucose-Capillary: 129 mg/dL — ABNORMAL HIGH (ref 65–99)
Glucose-Capillary: 149 mg/dL — ABNORMAL HIGH (ref 65–99)
Glucose-Capillary: 127 mg/dL — ABNORMAL HIGH (ref 65–99)

## 2017-01-31 LAB — CBC
HCT: 33.4 % — ABNORMAL LOW (ref 36.0–46.0)
Hemoglobin: 10.4 g/dL — ABNORMAL LOW (ref 12.0–15.0)
MCH: 26.9 pg (ref 26.0–34.0)
MCHC: 31.1 g/dL (ref 30.0–36.0)
MCV: 86.5 fL (ref 78.0–100.0)
PLATELETS: 454 10*3/uL — AB (ref 150–400)
RBC: 3.86 MIL/uL — ABNORMAL LOW (ref 3.87–5.11)
RDW: 17 % — ABNORMAL HIGH (ref 11.5–15.5)
WBC: 11.3 10*3/uL — AB (ref 4.0–10.5)

## 2017-01-31 LAB — TROPONIN I: Troponin I: <0.03 ng/mL (ref <0.03)

## 2017-01-31 MED ORDER — METOPROLOL TARTRATE 5 MG/5ML IV SOLN
5.0000 mg | Freq: Once | INTRAVENOUS | Status: AC
  Administered 2017-01-31: 5 mg via INTRAVENOUS

## 2017-01-31 MED ORDER — POTASSIUM CHLORIDE 20 MEQ/15ML (10%) PO SOLN
40.0000 meq | Freq: Once | ORAL | Status: AC
  Administered 2017-01-31: 40 meq
  Filled 2017-01-31: qty 30

## 2017-01-31 MED ORDER — SERTRALINE HCL 50 MG PO TABS
50.0000 mg | ORAL_TABLET | Freq: Every day | ORAL | Status: DC
  Administered 2017-01-31 – 2017-02-03 (×4): 50 mg
  Filled 2017-01-31 (×4): qty 1

## 2017-01-31 MED ORDER — LORAZEPAM 2 MG/ML IJ SOLN
0.5000 mg | Freq: Four times a day (QID) | INTRAMUSCULAR | Status: DC | PRN
  Administered 2017-02-03: 0.5 mg via INTRAVENOUS
  Filled 2017-01-31: qty 1

## 2017-01-31 MED ORDER — ONDANSETRON HCL 4 MG PO TABS: 4.0000 mg | ORAL_TABLET | Freq: Four times a day (QID) | ORAL | Status: DC | PRN

## 2017-01-31 MED ORDER — METOPROLOL TARTRATE 5 MG/5ML IV SOLN
INTRAVENOUS | Status: AC
  Filled 2017-01-31: qty 5

## 2017-01-31 MED ORDER — ONDANSETRON HCL 4 MG/2ML IJ SOLN: 4.0000 mg | Freq: Four times a day (QID) | INTRAMUSCULAR | Status: DC | PRN

## 2017-01-31 MED ORDER — FENTANYL CITRATE (PF) 100 MCG/2ML IJ SOLN
50.0000 ug | INTRAMUSCULAR | Status: DC | PRN
  Administered 2017-02-01 (×2): 50 ug via INTRAVENOUS
  Filled 2017-01-31 (×2): qty 2

## 2017-01-31 NOTE — Progress Notes (Signed)
Dr. Maudie Mercury notified of patient's increase in agitation and change in cardiac rhythm to Afib/flutter with rate 120-140's. New order for Lopressor 5 mg IV obtained and given. Patient medicated for pain with fentanyl 100 mg IV. Will continue to monitor.

## 2017-01-31 NOTE — Procedures (Addendum)
NIF (-28) and VC (1.0L) obtain with great effort.  Pt suctioned and then placed on the vent for the night.

## 2017-01-31 NOTE — Progress Notes (Signed)
Patient is now back in NSR with HR 77. BP 90/68. Pt resting and calm at this time. Will continue to monitor.

## 2017-01-31 NOTE — Progress Notes (Signed)
Flexiseal and foley catheter removed per protocol. No complications or signs of trauma noted.

## 2017-01-31 NOTE — Progress Notes (Signed)
[place] TEAM 1 - Stepdown/ICU TEAM  Joanne Morrison  [id number] DOB: 01-12-1951 DOA: 01/12/2017 PCP: Jerlyn Ly, MD    Brief Narrative:  66 yo female presented with weakness and myalgias for 3 to 4 days. She had recently completed therapy for Strep throat. Concern was that she developed Guillain Barre syndrome, and she was started on IVIG 1/30. She progressed to respiratory failure on 2/01 and required intubation.  Significant Events: 1/29 admit to [facility]  1/30 LP - IVIG initiated 2/1 intubated 2/9 trach  Subjective: The patient is resting comfortably in bed.  She responds to my questions.  She denies discomfort.  She does not appear to be in any acute respiratory distress.  Assessment & Plan:  Acute respiratory failure due to neuromuscular weakness presumed r/t GBS Vent and trach care per PCCM - appears stable at this time   Guillain Barre syndrome  IVIG course completed per Neuro - no plans for plasma exchange unless 2-4 weeks go by and no progress  A-fib with RVR / Sinus Tachycardia  Normal sinus rhythm at present  Hx HTN  Blood pressure reasonably controlled  Hypernatremia  Continue increased free water supplementation and follow trend - improving  Hypokalemia  Continue to supplement toward goal of 4.0 - magnesium is normal  Transaminitis  Steadily improving - follow to normalization  Dysphagia - S/P PEG  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 rate (!) 23, height _0  (1.575 m), weight 66.1  kg (145 lb 11.6 oz), SpO2 100 %.  Intake/Output Summary (Last 24 hours) at 01/31/17 1521 Last data filed at 01/31/17 1200  Gross per 24 hour  Intake             2260 ml  Output              770 ml  Net             1490 ml   Filed Weights   01/29/17 0336 01/30/17 0350 01/31/17 0500  Weight: 67.7 kg (149 lb 4 oz) 67.5 kg (148 lb 13 oz) 66.1 kg (145 lb 11.6 oz)    Examination: General: No acute respiratory distress Lungs: Clear to auscultation bilaterally without wheezes or crackles Cardiovascular: Regular rate and rhythm without murmur gallop or rub normal S1 and S2 Abdomen: Nontender, nondistended, soft, bowel sounds positive, no rebound, no ascites, no appreciable mass Extremities: Trace edema B LE   CBC:  Recent Labs Lab 01/26/17 0515 01/27/17 0141 01/28/17 0307 01/29/17 0229 01/31/17 0218  WBC 13.6* 13.3* 13.3* 14.9* 11.3*  NEUTROABS  --  10.3* 10.3*  --   --   HGB 8.9* 9.5* 10.3* 10.7* 10.4*  HCT 28.2* 30.1* 33.4* 34.4* 33.4*  MCV 83.4 83.4 85.2 86.6 86.5  PLT 328 426* 505* 512* 578*   Basic Metabolic Panel:  Recent Labs Lab 01/25/17 0422 01/25/17 1709 01/26/17 0515 01/27/17 0141 01/28/17 0307 01/29/17 0229 01/31/17 0218  NA 140 143 144 145 148* 150* 146*  K 3.5 4.0 3.4* 3.5 3.1* 3.5 3.2*  CL 105 107 110 109 111 117* 110  CO2 27 27  _0 GLUCOSE 142* 149* 102* 112* 147* 145* 162*  BUN 25* 29* 33* 33* 46* 45* 32*  CREATININE 0.74 0.79 0.70 0.74 1.02* 0.82 0.65  CALCIUM 8.9 9.0 9.2 9.8 10.6* 10.4* 9.6  MG 1.9 2.6* 2.3  --  2.3 2.4  --   PHOS 3.5  --  3.2  --  4.1  --   --    GFR: Estimated Creatinine Clearance: 63.4 mL/min (by C-G formula based on SCr of 0.65 mg/dL).  Liver Function Tests:  Recent Labs Lab 01/25/17 0422 01/26/17 0515 01/28/17 0307 01/29/17 0229 01/31/17 0218  AST 65*  --  112* 92* 51*  ALT 89*  --  144* 144* 90*  ALKPHOS 180*  --  135* 118 98  BILITOT 0.3  --  0.7 0.6 0.5  PROT 7.2  --  8.8* 8.5* 7.7  ALBUMIN 1.8*  2.0* 2.5* 2.6* 2.4*   Cardiac Enzymes:  Recent Labs Lab 01/26/17 1519 01/31/17 0218 01/31/17 0818 01/31/17 1350  TROPONINI <0.03 <0.03 <0.03 <0.03    HbA1C: Hgb A1c MFr Bld  Date/Time Value Ref Range Status  12/05/2009 01:06 PM 5.5 4.6 - 6.5 % Final    Comment:    See lab report for associated comment(s)  11/22/2008 12:00 AM 5.5 4.6 - 6.0 % Final    Comment:    See lab report for associated comment(s)    CBG:  Recent Labs Lab 01/30/17 2033 01/30/17 2332 01/31/17 0323 01/31/17 0755 01/31/17 1132  GLUCAP 132* 120* 182* 129* 149*    Scheduled Meds: . chlorhexidine gluconate (MEDLINE KIT)  15 mL Mouth Rinse BID  . erythromycin  1 application Both Eyes J8I  . feeding supplement (PRO-STAT SUGAR FREE 64)  30 mL Per Tube BID  . feeding supplement (VITAL AF 1.2 CAL)  1,000 mL Per Tube Q24H  . fentaNYL  50 mcg Transdermal Q72H  . free water  200 mL Per Tube Q8H  . heparin subcutaneous  5,000 Units Subcutaneous Q8H  . insulin aspart  0-15 Units Subcutaneous Q4H  . lidocaine  1 patch Transdermal Q24H  . LORazepam  1 mg Per Tube Q12H  . mouth rinse  15 mL Mouth Rinse 10 times per day  . pantoprazole sodium  40 mg Per Tube Q1200  . sertraline  50 mg Oral QHS   Continuous Infusions: . dextrose 50 mL/hr at 01/31/17 0913     LOS: 18 days   Cherene Altes, MD [facility] Office  [phone] Pager - Text Page per Shea Evans as per below:  On-Call/Text Page:      [email or web address]      password [email or web address]  If 7PM-7AM, please contact night-coverage [email or web address] Password [email or web address] 01/31/2017, 3:21 PM

## 2017-02-01 DIAGNOSIS — J9601 Acute respiratory failure with hypoxia: Secondary | ICD-10-CM | POA: Diagnosis not present

## 2017-02-01 DIAGNOSIS — I4891 Unspecified atrial fibrillation: Secondary | ICD-10-CM | POA: Diagnosis not present

## 2017-02-01 DIAGNOSIS — E876 Hypokalemia: Secondary | ICD-10-CM | POA: Diagnosis not present

## 2017-02-01 LAB — GLUCOSE, CAPILLARY
GLUCOSE-CAPILLARY: 129 mg/dL — AB (ref 65–99)
GLUCOSE-CAPILLARY: 150 mg/dL — AB (ref 65–99)
GLUCOSE-CAPILLARY: 114 mg/dL — AB (ref 65–99)
Glucose-Capillary: 100 mg/dL — ABNORMAL HIGH (ref 65–99)
Glucose-Capillary: 118 mg/dL — ABNORMAL HIGH (ref 65–99)
Glucose-Capillary: 124 mg/dL — ABNORMAL HIGH (ref 65–99)

## 2017-02-01 LAB — COMPREHENSIVE METABOLIC PANEL
ALT: 74 U/L — AB (ref 14–54)
AST: 41 U/L (ref 15–41)
Albumin: 2.4 g/dL — ABNORMAL LOW (ref 3.5–5.0)
Alkaline Phosphatase: 89 U/L (ref 38–126)
Anion gap: 6 (ref 5–15)
BUN: 25 mg/dL — ABNORMAL HIGH (ref 6–20)
CHLORIDE: 106 mmol/L (ref 101–111)
CO2: 26 mmol/L (ref 22–32)
CREATININE: 0.61 mg/dL (ref 0.44–1.00)
Calcium: 9.5 mg/dL (ref 8.9–10.3)
Glucose, Bld: 122 mg/dL — ABNORMAL HIGH (ref 65–99)
Potassium: 3.3 mmol/L — ABNORMAL LOW (ref 3.5–5.1)
Sodium: 138 mmol/L (ref 135–145)
TOTAL PROTEIN: 7.2 g/dL (ref 6.5–8.1)
Total Bilirubin: 0.7 mg/dL (ref 0.3–1.2)

## 2017-02-01 MED ORDER — POTASSIUM CHLORIDE 20 MEQ/15ML (10%) PO SOLN
40.0000 meq | Freq: Three times a day (TID) | ORAL | Status: AC
  Administered 2017-02-01 – 2017-02-02 (×3): 40 meq
  Filled 2017-02-01 (×3): qty 30

## 2017-02-01 MED ORDER — FREE WATER
100.0000 mL | Freq: Three times a day (TID) | Status: DC
  Administered 2017-02-01 – 2017-02-04 (×8): 100 mL

## 2017-02-01 NOTE — Procedures (Signed)
Pt transported with RN and nursing aide to [facility]-step[facility] room 7 without complications.

## 2017-02-01 NOTE — Progress Notes (Signed)
Joanne Morrison TEAM 1 - Stepdown/ICU TEAM  Joanne Morrison  [id number] DOB: 06-17-1951 DOA: 01/12/2017 PCP: Jerlyn Ly, MD    Brief Narrative:  66 yo female presented with weakness and myalgias for 3 to 4 days. She had recently completed therapy for Strep throat. Concern was that she developed Guillain Barre syndrome, and she was started on IVIG 1/30. She progressed to respiratory failure on 2/01 and required intubation.  Significant Events: 1/29 admit to [facility]  1/30 LP - IVIG initiated 2/1 intubated 2/9 trach  Subjective: The patient is alert and conversant.  She c/o ongoing low back pain. She denies sob, n/v, or abdom pain.    Assessment & Plan:  Acute respiratory failure due to neuromuscular weakness presumed r/t GBS Vent and trach care per PCCM - appears stable at this time - tolerating trach collar at time of my visit today   Guillain Barre syndrome  IVIG course completed per Neuro - no plans for plasma exchange unless 2-4 weeks go by and no progress - appears to be slowly improving as supported by her Husband at bedside  A-fib with RVR / Sinus Tachycardia  Maintaining sinus rhythm at this time  Hx HTN  Blood pressure trend reasonably controlled   Hypernatremia  Corrected with free water expansion - stop IV free water and follow  Hypokalemia  Continue to supplement toward goal of 4.0 - magnesium is normal  Transaminitis  Steadily improving - follow to normalization  Dysphagia - S/P PEG  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 rate 15, height 5' 2" (1.575 m), weight 68.9 kg (151 lb 14.4 oz), SpO2 100 %.  Intake/Output Summary (Last 24 hours) at 02/01/17 1404 Last data filed at 02/01/17 1331  Gross per 24 hour  Intake             1700 ml  Output             1150 ml  Net              550 ml   Filed Weights   01/30/17 0350 01/31/17 0500 02/01/17 0452  Weight: 67.5 kg (148 lb 13 oz) 66.1 kg (145 lb 11.6 oz) 68.9 kg (151 lb 14.4 oz)    Examination: General: No acute respiratory distress on TC Lungs: Clear to auscultation bilaterally without wheeze Cardiovascular: Regular rate and rhythm without murmur  Abdomen: Nontender, nondistended, soft, bowel sounds positive, no rebound, no ascites, no appreciable mass Extremities: Trace edema B LE   CBC:  Recent Labs Lab 01/26/17 0515 01/27/17 0141 01/28/17 0307 01/29/17 0229 01/31/17 0218  WBC 13.6* 13.3* 13.3* 14.9* 11.3*  NEUTROABS  --  10.3* 10.3*  --   --   HGB 8.9* 9.5* 10.3* 10.7* 10.4*  HCT 28.2* 30.1* 33.4* 34.4* 33.4*  MCV 83.4 83.4 85.2 86.6 86.5  PLT 328 426* 505* 512* 572*   Basic Metabolic Panel:  Recent Labs Lab 01/25/17 1709 01/26/17 0515 01/27/17 0141 01/28/17 0307 01/29/17 0229 01/31/17 0218 02/01/17 0203  NA 143 144 145 148* 150* 146* 138  K 4.0 3.4* 3.5 3.1* 3.5 3.2* 3.3*  CL  107 110 109 111 117* 110 106  CO2 _0 GLUCOSE 149* 102* 112* 147* 145* 162* 122*  BUN 29* 33* 33* 46* 45* 32* 25*  CREATININE 0.79 0.70 0.74 1.02* 0.82 0.65 0.61  CALCIUM 9.0 9.2 9.8 10.6* 10.4* 9.6 9.5  MG 2.6* 2.3  --  2.3 2.4  --   --   PHOS  --  3.2  --  4.1  --   --   --    GFR: Estimated Creatinine Clearance: 64.6 mL/min (by C-G formula based on SCr of 0.61 mg/dL).  Liver Function Tests:  Recent Labs Lab 01/26/17 0515 01/28/17 0307 01/29/17 0229 01/31/17 0218 02/01/17 0203  AST  --  112* 92* 51* 41  ALT  --  144* 144* 90* 74*  ALKPHOS  --  135* 118 98 89  BILITOT  --  0.7 0.6 0.5 0.7    PROT  --  8.8* 8.5* 7.7 7.2  ALBUMIN 2.0* 2.5* 2.6* 2.4* 2.4*   Cardiac Enzymes:  Recent Labs Lab 01/26/17 1519 01/31/17 0218 01/31/17 0818 01/31/17 1350  TROPONINI <0.03 <0.03 <0.03 <0.03    HbA1C: Hgb A1c MFr Bld  Date/Time Value Ref Range Status  12/05/2009 01:06 PM 5.5 4.6 - 6.5 % Final    Comment:    See lab report for associated comment(s)  11/22/2008 12:00 AM 5.5 4.6 - 6.0 % Final    Comment:    See lab report for associated comment(s)    CBG:  Recent Labs Lab 01/31/17 2013 01/31/17 2329 02/01/17 0355 02/01/17 0809 02/01/17 1202  GLUCAP 134* 115* 129* 150* 100*    Scheduled Meds: . chlorhexidine gluconate (MEDLINE KIT)  15 mL Mouth Rinse BID  . erythromycin  1 application Both Eyes H0Q  . feeding supplement (PRO-STAT SUGAR FREE 64)  30 mL Per Tube BID  . feeding supplement (VITAL AF 1.2 CAL)  1,000 mL Per Tube Q24H  . fentaNYL  50 mcg Transdermal Q72H  . free water  200 mL Per Tube Q8H  . heparin subcutaneous  5,000 Units Subcutaneous Q8H  . insulin aspart  0-15 Units Subcutaneous Q4H  . lidocaine  1 patch Transdermal Q24H  . LORazepam  1 mg Per Tube Q12H  . mouth rinse  15 mL Mouth Rinse 10 times per day  . pantoprazole sodium  40 mg Per Tube Q1200  . sertraline  50 mg Per Tube QHS      LOS: 19 days   Cherene Altes, MD [facility] Office  [phone] Pager - Text Page per Amion as per below:  On-Call/Text Page:      [email or web address]      password [email or web address]  If 7PM-7AM, please contact night-coverage [email or web address] Password [email or web address] 02/01/2017, 2:04 PM

## 2017-02-01 NOTE — Progress Notes (Signed)
FVC 1.5L. NIF -40

## 2017-02-02 DIAGNOSIS — I4891 Unspecified atrial fibrillation: Secondary | ICD-10-CM | POA: Diagnosis not present

## 2017-02-02 DIAGNOSIS — E876 Hypokalemia: Secondary | ICD-10-CM | POA: Diagnosis not present

## 2017-02-02 LAB — COMPREHENSIVE METABOLIC PANEL
ALT: 74 U/L — AB (ref 14–54)
AST: 46 U/L — AB (ref 15–41)
Albumin: 2.4 g/dL — ABNORMAL LOW (ref 3.5–5.0)
Alkaline Phosphatase: 86 U/L (ref 38–126)
Anion gap: 7 (ref 5–15)
BUN: 21 mg/dL — ABNORMAL HIGH (ref 6–20)
CHLORIDE: 104 mmol/L (ref 101–111)
CO2: 28 mmol/L (ref 22–32)
CREATININE: 0.51 mg/dL (ref 0.44–1.00)
Calcium: 9.5 mg/dL (ref 8.9–10.3)
GFR calc non Af Amer: >60 mL/min (ref >60)
Glucose, Bld: 128 mg/dL — ABNORMAL HIGH (ref 65–99)
POTASSIUM: 4.2 mmol/L (ref 3.5–5.1)
SODIUM: 139 mmol/L (ref 135–145)
Total Bilirubin: 0.6 mg/dL (ref 0.3–1.2)
Total Protein: 7.1 g/dL (ref 6.5–8.1)

## 2017-02-02 LAB — GLUCOSE, CAPILLARY
GLUCOSE-CAPILLARY: 128 mg/dL — AB (ref 65–99)
GLUCOSE-CAPILLARY: 114 mg/dL — AB (ref 65–99)
Glucose-Capillary: 125 mg/dL — ABNORMAL HIGH (ref 65–99)
Glucose-Capillary: 144 mg/dL — ABNORMAL HIGH (ref 65–99)
Glucose-Capillary: 125 mg/dL — ABNORMAL HIGH (ref 65–99)

## 2017-02-02 MED ORDER — JEVITY 1.2 CAL PO LIQD
1000.0000 mL | ORAL | Status: DC
  Administered 2017-02-02 – 2017-02-04 (×3): 1000 mL
  Filled 2017-02-02 (×4): qty 1000

## 2017-02-02 NOTE — Progress Notes (Signed)
RT note: NIF of -22, VC of 1.6L with good effort.

## 2017-02-02 NOTE — Progress Notes (Signed)
Ophthalmology Progress note  Patient is able to mouth words now.  She reports that the left eye still hurts.  She is patched with Eyrthromycin ung OS, but patch is loose.  She is getting the ointment OU q 4 hours.  She reports that here skin hurts from removing and replacing the tape.  OD:  Trace injection.  Very mild inferior epithelial irregularity.  OS:  2+ injection.  Epithelial defect smaller than Friday.  Mild haze/dullness   Plan:  I offered temporary tarsorhaphy, but patient declined.  I placed copious erythromycin ointment in both eyes.  I patched OS closed tightly.  I spoke to her nurse.  Will keep patch on OS for 24 hours to help skin irritation and because I know I placed the patch on tightly.  Remove patch tomorrow at lunch time and resume q 4 hour ointment and patching.  Continue ointment OD q 4 hours as already planned.  I asked patient to strongly consider tarsorhaphy.  Call me with concerns.  Luberta Mutter, MD [phone]

## 2017-02-02 NOTE — Progress Notes (Signed)
RT did VC/NIF with patient. VC 1000, NIF -40

## 2017-02-02 NOTE — Progress Notes (Signed)
Nutrition Follow-up  INTERVENTION:   Recommend increase free water to 250 ml every 8 hours  D/C Vital AF 1.2  Jevity 1.2 @ 45 ml/hr Continue 30 ml Prostat BID Provides: 1496 kcal, 90 grams protein, and 874 ml free water  Free water 100 ml every 8 hours (added 2/18) Total free water: 1174 ml  NUTRITION DIAGNOSIS:   Inadequate oral intake related to inability to eat as evidenced by NPO status. Ongoing.   GOAL:   Patient will meet greater than or equal to 90% of their needs Progressing.   MONITOR:   TF tolerance, Vent status, I & O's, Labs  ASSESSMENT:   66yo female with hx chronic cystitis, depression/anxiety who presented 1/29 with L>R BLE weakness, LUE weakness and worsening myalgias x 3-4 days.  She was recently dx with strep pharyngitis and completed a course of zithromax.   She was seen in consultation by neurology and felt her s/s were most c/w guillain barre. She was started on IVIG 1/30.  Has tolerated trach collar x 24 hours 2/12 PEG Per MD no plans for further treatment for now unless not making improvement.  Spoke with RN, pt currently on bedpan  Diet Order:  Diet NPO time specified  Skin:  Wound (see comment) (stage I sacrum)  Last BM:  2/18  Height:   Ht Readings from Last 1 Encounters:  01/15/17 5\' 2"  (1.575 m)    Weight:   Wt Readings from Last 1 Encounters:  02/02/17 151 lb 0.2 oz (68.5 kg)    Ideal Body Weight:  50 kg  BMI:  Body mass index is 27.62 kg/m.  Estimated Nutritional Needs:   Kcal:  1400-1600  Protein:  85-100 grams  Fluid:  > 1.5 L/day  EDUCATION NEEDS:   No education needs identified at this time  [place], [place], [place] Pager [phone] After Hours Pager

## 2017-02-02 NOTE — Care Management Note (Addendum)
Case Management Note  Patient Details  Name: Joanne Morrison MRN: [id number] Date of Birth: Mar 02, 1951  Subjective/Objective:         Adm w Rene Paci, off vent at present           Action/Plan: lives w husband   Expected Discharge Date:                  Expected Discharge Plan:  Long Term Acute Care (LTAC)  In-House Referral:  Clinical Social Work  Discharge planning Services  CM Consult  Post Acute Care Choice:    Choice offered to:     DME Arranged:    DME Agency:     HH Arranged:    [facility] Agency:     Status of Service:  In process, will continue to follow  If discussed at Long Length of Stay Meetings, dates discussed:    Additional Comments: both ltac's have been given ltac approval by [facility]bs. husb and wife deciding which ltac they prefer. Await decision. 2/19 1517 spoke w pt and husb. [facility] have [facility]. Have alerted [facility] to work on final contract and req to [employer] [facility] they pull their req for auth. Spoke w cary phy there and pt was able to stand today. Cary still wants cir and feels pt could handle. Have req cm barbara b to take look at pt again now since improv w phy there and on trach collar. Will cont to follow.  Lacretia Leigh, RN 02/02/2017, 11:03 AM

## 2017-02-02 NOTE — Progress Notes (Signed)
Physical Therapy Treatment Patient Details Name: Joanne Morrison MRN: [id number] DOB: September 22, 1951 Today's Date: 02/02/2017    History of Present Illness Patient is a 66 y.o. female with medical history significant of chronic low back pain, anxiety, depression, hypertension-recently had sorethroat (+strep) presented to the ED on 1/29 with lower ext weakness,tingling numbess that started in the lower extremities and subsequently started involving her upper extremities. Unable to elicit DTR in the lower ext. MRI of the entire spine negative. Concern for GBS-started on empiric IVIG by neurology.Developed respiratory failure and was intubated 2/1 and trasnferred to ICU.Marland Kitchen Trach 01/23/17.    PT Comments    Pt continues to make excellent progress. Pt able to stand today and has been off vent a while. Feel pt is excellent CIR candidate.    Follow Up Recommendations  CIR     Equipment Recommendations  Other (comment) (TBD, defer to next venue)    Recommendations for Other Services Rehab consult     Precautions / Restrictions Precautions Precautions: Fall Precaution Comments: trach Required Braces or Orthoses: Other Brace/Splint Other Brace/Splint: B PRAFO  Restrictions Weight Bearing Restrictions: No    Mobility  Bed Mobility Overal bed mobility: Needs Assistance Bed Mobility: Rolling;Sidelying to Sit;Sit to Sidelying Rolling: Mod assist Sidelying to sit: +2 for physical assistance;Mod assist     Sit to sidelying: +2 for physical assistance;Mod assist General bed mobility comments: Assist to bring legs off bed, elevate trunk into sitting, and bring hips to EOB  Transfers Overall transfer level: Needs assistance Equipment used: Ambulation equipment used Transfers: Sit to/from Stand Sit to Stand: +2 physical assistance;Mod assist         General transfer comment: Assist to bring hips and trunk up using pad under hips. Assist to bring trunk out of flexion and to stand more erect.  Facilitation to extend hips  Ambulation/Gait                 Stairs            Wheelchair Mobility    Modified Rankin (Stroke Patients Only)       Balance Overall balance assessment: Needs assistance Sitting-balance support: Feet supported;Bilateral upper extremity supported Sitting balance-Leahy Scale: Poor Sitting balance - Comments: Pt sat EOB x 10 minutes with UE support and min guard assist Postural control: Posterior lean Standing balance support: Bilateral upper extremity supported Standing balance-Leahy Scale: Poor Standing balance comment: Pt stood x 2 with Stedy. Pt stood 20-30 sec each time with +2 min to mod A to maintain.                    Cognition Arousal/Alertness: Awake/alert Behavior During Therapy: WFL for tasks assessed/performed Overall Cognitive Status: Difficult to assess                 General Comments: following 1 step commands consistently     Exercises General Exercises - Lower Extremity Heel Slides: AAROM;Both;10 reps;Supine    General Comments        Pertinent Vitals/Pain Pain Assessment: Faces Faces Pain Scale: Hurts little more Pain Location: back with transitional movements Pain Descriptors / Indicators: Grimacing Pain Intervention(s): Limited activity within patient's tolerance;Monitored during session    Home Living                      Prior Function            PT Goals (current goals can now be found in the care plan  section) Acute Rehab PT Goals PT Goal Formulation: With patient Time For Goal Achievement: 02/16/17 Potential to Achieve Goals: Fair Progress towards PT goals: Goals met and updated - see care plan    Frequency    Min 3X/week      PT Plan Current plan remains appropriate    Co-evaluation             End of Session Equipment Utilized During Treatment: Oxygen (30% trach collar) Activity Tolerance: Patient tolerated treatment well Patient left: in bed;with  call bell/phone within reach;with family/visitor present Nurse Communication: Mobility status;Need for lift equipment PT Visit Diagnosis: Muscle weakness (generalized) (M62.81)     Time: 5790-7931 PT Time Calculation (min) (ACUTE ONLY): 35 min  Charges:  $Therapeutic Activity: 23-37 mins                    G CodesShary Decamp Morrison 2017-02-23, 3:30 PM [employer] PT [phone]

## 2017-02-02 NOTE — Progress Notes (Signed)
[facility] PCCM  Resting comfortably NIF -22, VC 1.6L this morning Has not needed vent for several days Continue to keep cuff down Advance to speaking valve if able. Will follow  Roselie Awkward, MD [place] PCCM Pager: [phone] Cell: [phone] After 3pm or if no response, call [phone]

## 2017-02-02 NOTE — Progress Notes (Signed)
[place] TEAM 1 - Stepdown/ICU TEAM  Joanne Morrison  VQM:[id number] DOB: Nov 21, 1951 DOA: 01/12/2017 PCP: Jerlyn Ly, MD    Brief Narrative:  66 yo female presented with weakness and myalgias for 3 to 4 days. She had recently completed therapy for Strep throat. Concern was that she developed Guillain Barre syndrome, and she was started on IVIG 1/30. She progressed to respiratory failure on 2/01 and required intubation.  Significant Events: 1/29 admit to [facility]  1/30 LP - IVIG initiated 2/1 intubated 2/9 trach  Subjective: The patient is resting comfortably in bed.  She denies chest pain or shortness of breath.  She has no new complaints today.  Assessment & Plan:  Acute respiratory failure due to neuromuscular weakness presumed r/t GBS Vent and trach care per PCCM - appears stable at this time - tolerating trach collar at time of my visit again today   Guillain Barre syndrome  IVIG course completed per Neuro - no plans for plasma exchange unless 2-4 weeks go by and no progress - slowly improving as expected  A-fib with RVR / Sinus Tachycardia  Maintaining sinus rhythm at this time  Hx HTN  Blood pressure controlled   Hypernatremia  Corrected with free water expansion   Hypokalemia  Corrected - magnesium is normal  Transaminitis  Slowly improving - follow to normalization  Dysphagia - S/P PEG  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 rate (!) 29, height 5' 2"  (1.575 m), weight 68.5 kg  (151 lb 0.2 oz), SpO2 100 %.  Intake/Output Summary (Last 24 hours) at 02/02/17 1413 Last data filed at 02/02/17 1347  Gross per 24 hour  Intake             1080 ml  Output              975 ml  Net              105 ml   Filed Weights   01/31/17 0500 02/01/17 0452 02/02/17 0500  Weight: 66.1 kg (145 lb 11.6 oz) 68.9 kg (151 lb 14.4 oz) 68.5 kg (151 lb 0.2 oz)    Examination: General: No acute respiratory distress on TC - alert and conversant  Lungs: Clear to auscultation bilaterally Cardiovascular: Regular rate and rhythm without murmur  Abdomen: Nontender, nondistended, soft, bowel sounds positive, no rebound Extremities: trace edema B LE   CBC:  Recent Labs Lab 01/27/17 0141 01/28/17 0307 01/29/17 0229 01/31/17 0218  WBC 13.3* 13.3* 14.9* 11.3*  NEUTROABS 10.3* 10.3*  --   --   HGB 9.5* 10.3* 10.7* 10.4*  HCT 30.1* 33.4* 34.4* 33.4*  MCV 83.4 85.2 86.6 86.5  PLT 426* 505* 512* 932*   Basic Metabolic Panel:  Recent Labs Lab 01/28/17 0307 01/29/17 0229 01/31/17 0218 02/01/17 0203 02/02/17 0218  NA 148* 150* 146* 138 139  K 3.1* 3.5 3.2* 3.3* 4.2  CL 111 117* 110 106 104  CO2 26 24 26 26 28   GLUCOSE 147* 145* 162* 122* 128*  BUN 46* 45* 32* 25* 21*  CREATININE 1.02* 0.82 0.65 0.61 0.51  CALCIUM 10.6* 10.4* 9.6 9.5 9.5  MG 2.3 2.4  --   --   --   PHOS 4.1  --   --   --   --    GFR: Estimated Creatinine Clearance: 64.5 mL/min (by C-G formula based on SCr of 0.51 mg/dL).  Liver Function Tests:  Recent Labs Lab 01/28/17 0307 01/29/17 0229 01/31/17 0218 02/01/17 0203 02/02/17 0218  AST 112* 92* 51* 41 46*  ALT 144* 144* 90* 74* 74*  ALKPHOS 135* 118 98 89 86  BILITOT 0.7 0.6 0.5 0.7 0.6  PROT 8.8* 8.5* 7.7 7.2 7.1  ALBUMIN 2.5* 2.6* 2.4* 2.4* 2.4*   Cardiac Enzymes:  Recent Labs Lab 01/26/17 1519 01/31/17 0218 01/31/17 0818 01/31/17 1350  TROPONINI <0.03 <0.03 <0.03 <0.03    HbA1C: Hgb A1c MFr Bld  Date/Time Value Ref Range Status    12/05/2009 01:06 PM 5.5 4.6 - 6.5 % Final    Comment:    See lab report for associated comment(s)  11/22/2008 12:00 AM 5.5 4.6 - 6.0 % Final    Comment:    See lab report for associated comment(s)    CBG:  Recent Labs Lab 02/01/17 2023 02/01/17 2355 02/02/17 0446 02/02/17 0803 02/02/17 1123  GLUCAP 114* 118* 128* 114* 125*    Scheduled Meds: . chlorhexidine gluconate (MEDLINE KIT)  15 mL Mouth Rinse BID  . erythromycin  1 application Both Eyes T3M  . feeding supplement (PRO-STAT SUGAR FREE 64)  30 mL Per Tube BID  . fentaNYL  50 mcg Transdermal Q72H  . free water  100 mL Per Tube Q8H  . heparin subcutaneous  5,000 Units Subcutaneous Q8H  . insulin aspart  0-15 Units Subcutaneous Q4H  . lidocaine  1 patch Transdermal Q24H  . LORazepam  1 mg Per Tube Q12H  . mouth rinse  15 mL Mouth Rinse 10 times per day  . pantoprazole sodium  40 mg Per Tube Q1200  . sertraline  50 mg Per Tube QHS    . feeding supplement (JEVITY 1.2 CAL) 1,000 mL (02/02/17 1313)    LOS: 20 days   Cherene Altes, MD [facility] Office  [phone] Pager - Text Page per Amion as per below:  On-Call/Text Page:      [email or web address]      password [email or web address]  If 7PM-7AM, please contact night-coverage [email or web address] Password [email or web address] 02/02/2017, 2:13 PM

## 2017-02-02 NOTE — Progress Notes (Addendum)
Contacted by RN CM to reevaluated pt for a possible inpt rehab admission. Noted pt progress. I will assess pt at bedside on Tuesday and discuss plans with pt and her husband. Rehab consult completed 01/14/17 and we signed off when pt intubated. [id number]

## 2017-02-02 NOTE — Progress Notes (Signed)
Name: Joanne Morrison MRN: [id number] DOB: 1951-10-21    ADMISSION DATE:  01/12/2017 CONSULTATION DATE:  01/15/2017  REFERRING MD :  Sloan Leiter ([facility])   CHIEF COMPLAINT:  Dyspnea, potential for respiratory compromise   BRIEF PATIENT DESCRIPTION:  66 yo female presented with weakness and myalgias for 3 to 4 days.  She had recently completed therapy for Strep throat.  Concern was that she developed Guillain Barre syndrome, and she was started on IVIG 1/30.  She progressed to respiratory failure on 2/01 and required intubation.     SUBJECTIVE:  Still w/ weak phonation   VITAL SIGNS: BP 126/82 (BP Location: Right Arm)   Pulse (!) 105   Temp 98 F (36.7 C) (Oral)   Resp (!) 21   Ht 5\' 2"  (1.575 m)   Wt 151 lb 0.2 oz (68.5 kg)   SpO2 100%   BMI 27.62 kg/m   INTAKE / OUTPUT: I/O last 3 completed shifts: In: 1180 [I.V.:420; NG/GT:760] Out: 1650 [Urine:1650]  General appearance:  66 Year old  female, well nourished  NAD, conversant  Eyes: anicteric sclerae, left eye has patch, moist conjunctivae; PERRL, EOMI bilaterally. Mouth:  membranes and no mucosal ulcerations; normal hard and soft palate Neck: Trachea midline; neck supple, no JVD # 6 cuffed trach. Clear white secretions Lungs/chest: scattered rhonchi  with normal respiratory effort and no intercostal retractions CV: RRR, no MRGs  Abdomen: Soft, non-tender; no masses or HSM Extremities: No peripheral edema or extremity lymphadenopathy Skin: Normal temperature, turgor and texture; no rash, ulcers or subcutaneous nodules Neuro/Psych: Appropriate affect, alert and oriented to person, place and time. Moving all extremities. Much stronger    LABS:  BMET  Recent Labs Lab 01/31/17 0218 02/01/17 0203 02/02/17 0218  NA 146* 138 139  K 3.2* 3.3* 4.2  CL 110 106 104  CO2 26 26 28   BUN 32* 25* 21*  CREATININE 0.65 0.61 0.51  GLUCOSE 162* 122* 128*   Electrolytes  Recent Labs Lab 01/28/17 0307 01/29/17 0229  01/31/17 0218 02/01/17 0203 02/02/17 0218  CALCIUM 10.6* 10.4* 9.6 9.5 9.5  MG 2.3 2.4  --   --   --   PHOS 4.1  --   --   --   --    CBC  Recent Labs Lab 01/28/17 0307 01/29/17 0229 01/31/17 0218  WBC 13.3* 14.9* 11.3*  HGB 10.3* 10.7* 10.4*  HCT 33.4* 34.4* 33.4*  PLT 505* 512* 454*   Coag's No results for input(s): APTT, INR in the last 168 hours. Sepsis Markers No results for input(s): LATICACIDVEN, PROCALCITON, O2SATVEN in the last 168 hours. ABG No results for input(s): PHART, PCO2ART, PO2ART in the last 168 hours. Liver Enzymes  Recent Labs Lab 01/31/17 0218 02/01/17 0203 02/02/17 0218  AST 51* 41 46*  ALT 90* 74* 74*  ALKPHOS 98 89 86  BILITOT 0.5 0.7 0.6  ALBUMIN 2.4* 2.4* 2.4*   Cardiac Enzymes  Recent Labs Lab 01/31/17 0218 01/31/17 0818 01/31/17 1350  TROPONINI <0.03 <0.03 <0.03    Glucose  Recent Labs Lab 02/01/17 1715 02/01/17 2023 02/01/17 2355 02/02/17 0446 02/02/17 0803 02/02/17 1123  GLUCAP 124* 114* 118* 128* 114* 125*   Imaging No results found. 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 No distress. Working on Hotel [employer]. She looks much better. Her quality of phonation is poor however and she does still have some thick tracheal secretions so would rather keep trach as large as possible until a little stronger and cough more effective.   Plan -Will change to 6 cuffless portex (this has a smaller flange which will relieve the pressure on her neck and assist w/ healing the pressure ulcer.  -Also it is about the same size as  A # 5 shiley so should facilitate phonation better and possibly swallowing.  -She is not a candidate for decannulation as of yet; but I anticipate she will be.  -Very important to continue humidified ATC at night and maximize PMV during the day time as tolerated.   Erick Colace ACNP-BC [place] Pager # [phone] OR # [phone] if no answer

## 2017-02-02 NOTE — Progress Notes (Signed)
RT note:  Sutures removed from trach site without complications per MD order.

## 2017-02-02 NOTE — Progress Notes (Signed)
  Speech Language Pathology Treatment: Nada Boozer Speaking valve  Patient Details Name: Shizuka Eike MRN: [id number] DOB: Jun 03, 1951 Today's Date: 02/02/2017 Time: [id number] SLP Time Calculation (min) (ACUTE ONLY): 20 min  Assessment / Plan / Recommendation Clinical Impression  Pt demonstrates improved oral motor movement for better articulation with mouthing words. SLP placed PMSV and with max fadin to min verbal cues for a deep breath and phonation pt progressed from sustained phonation to single words used in functional communication tasks. Vocal quality loud and clear at word level. Breath support and early fatigue are the major barrier to phrase length communication. Pt also repeatedly requested rest breaks with PMSV in place. She denied discomfort but did feel extra effort was needed to wear PMSV. RN may place PMSV for brief intervals for functional communication. SLP discussed progress with pt and family. Will follow for further tolerance.   HPI HPI: Patient is a 66 y.o. female with medical history significant of chronic low back pain, anxiety, depression, hypertension-recently had sorethroat (+strep) presented to the ED on 1/29 with lower ext weakness,tingling numbess that started in the lower extremities and subsequently started involving her upper extremities. Unable to elicit DTR in the lower ext. MRI of the entire spine negative. 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 Progressed to respiratory failure and required intubation 2/1, trach 2/9, tolerating trach collar with vent on standby, per respiratory patient on 30% ATC pt tolerated well SATS 98%, HR 90, RR 23, NIF -22 cmH2O x3, VC 1.1L X3.       SLP Plan  Continue with current plan of care       Recommendations  Diet recommendations: NPO      Patient may use Passy-Muir Speech Valve: Intermittently with supervision PMSV Supervision: Full         Oral Care Recommendations: Oral care QID Plan: Continue with  current plan of care       GO               [facility], MA CCC-SLP [id number]  Lynann Beaver 02/02/2017, 1:59 PM

## 2017-02-03 DIAGNOSIS — E876 Hypokalemia: Secondary | ICD-10-CM | POA: Diagnosis not present

## 2017-02-03 DIAGNOSIS — R531 Weakness: Secondary | ICD-10-CM | POA: Diagnosis not present

## 2017-02-03 DIAGNOSIS — B999 Unspecified infectious disease: Secondary | ICD-10-CM | POA: Diagnosis not present

## 2017-02-03 DIAGNOSIS — R29898 Other symptoms and signs involving the musculoskeletal system: Secondary | ICD-10-CM | POA: Diagnosis not present

## 2017-02-03 DIAGNOSIS — J9600 Acute respiratory failure, unspecified whether with hypoxia or hypercapnia: Secondary | ICD-10-CM | POA: Diagnosis not present

## 2017-02-03 DIAGNOSIS — G610 Guillain-Barre syndrome: Secondary | ICD-10-CM | POA: Diagnosis not present

## 2017-02-03 DIAGNOSIS — E871 Hypo-osmolality and hyponatremia: Secondary | ICD-10-CM | POA: Diagnosis not present

## 2017-02-03 LAB — GLUCOSE, CAPILLARY
GLUCOSE-CAPILLARY: 151 mg/dL — AB (ref 65–99)
GLUCOSE-CAPILLARY: 156 mg/dL — AB (ref 65–99)
Glucose-Capillary: 116 mg/dL — ABNORMAL HIGH (ref 65–99)
Glucose-Capillary: 133 mg/dL — ABNORMAL HIGH (ref 65–99)
Glucose-Capillary: 147 mg/dL — ABNORMAL HIGH (ref 65–99)
Glucose-Capillary: 141 mg/dL — ABNORMAL HIGH (ref 65–99)

## 2017-02-03 MED ORDER — FREE WATER: 100.0000 mL | Freq: Three times a day (TID) | Status: DC

## 2017-02-03 MED ORDER — SERTRALINE HCL 50 MG PO TABS: 50.0000 mg | ORAL_TABLET | Freq: Every day | ORAL | Status: DC

## 2017-02-03 MED ORDER — PANTOPRAZOLE SODIUM 40 MG PO PACK: 40.0000 mg | PACK | Freq: Every day | ORAL | Status: DC

## 2017-02-03 MED ORDER — LIDOCAINE 5 % EX PTCH: 1.0000 | MEDICATED_PATCH | CUTANEOUS | 0 refills | Status: DC

## 2017-02-03 MED ORDER — DOCUSATE SODIUM 50 MG/5ML PO LIQD
100.0000 mg | Freq: Two times a day (BID) | ORAL | 0 refills | Status: DC | PRN
Start: 2017-02-03 — End: 2017-02-20

## 2017-02-03 MED ORDER — INSULIN ASPART 100 UNIT/ML ~~LOC~~ SOLN
SUBCUTANEOUS | 12 refills | Status: DC
Start: 2017-02-03 — End: 2017-02-20

## 2017-02-03 MED ORDER — LORAZEPAM 1 MG PO TABS: 1.0000 mg | ORAL_TABLET | Freq: Two times a day (BID) | ORAL | 0 refills | Status: DC

## 2017-02-03 MED ORDER — ERYTHROMYCIN 5 MG/GM OP OINT: 1.0000 "application " | TOPICAL_OINTMENT | OPHTHALMIC | 0 refills | Status: DC

## 2017-02-03 MED ORDER — PRO-STAT SUGAR FREE PO LIQD: 30.0000 mL | Freq: Two times a day (BID) | ORAL | 0 refills | Status: DC

## 2017-02-03 MED ORDER — JEVITY 1.2 CAL PO LIQD: 1000.0000 mL | ORAL | 0 refills | Status: DC

## 2017-02-03 MED ORDER — HYDRALAZINE HCL 20 MG/ML IJ SOLN: 10.0000 mg | INTRAMUSCULAR | Status: DC | PRN

## 2017-02-03 MED ORDER — ONDANSETRON HCL 4 MG PO TABS: 4.0000 mg | ORAL_TABLET | Freq: Four times a day (QID) | ORAL | 0 refills | Status: DC | PRN

## 2017-02-03 MED ORDER — ALBUTEROL SULFATE (2.5 MG/3ML) 0.083% IN NEBU: 2.5000 mg | INHALATION_SOLUTION | RESPIRATORY_TRACT | 12 refills | Status: DC | PRN

## 2017-02-03 NOTE — Progress Notes (Signed)
Took over care of patient from [facility] at approx. [place]

## 2017-02-03 NOTE — Progress Notes (Signed)
phy and occup there rec inpt rehab and that pt can handle at this time. Pt has trach to trach collar w passey muir valve. Pt speaking today. Stood w hands on walker. [facility] feels doing too well for ltac and cir cm working on auth from [facility] since pt has made so much progress.cont to follow. Dr Titus Mould in when pt stood and feels ready for cir as soon as auth obtained. prob will not get bet and auth til 2-21.

## 2017-02-03 NOTE — Progress Notes (Signed)
RT Note: Respiratory mechanics were done with the patient this AM. Her Nif was -44 and Vital Capacity was 800. Patient gave a great effort. MD at bedside and notified.

## 2017-02-03 NOTE — Discharge Instructions (Signed)
Follow with Primary MD Crist Infante A, MD in 7 days   Get CBC, CMP, 2 view Chest X ray checked  by Primary MD or SNF MD in 5-7 days ( we routinely change or add medications that can affect your baseline labs and fluid status, therefore we recommend that you get the mentioned basic workup next visit with your PCP, your PCP may decide not to get them or add new tests based on their clinical decision)  Activity: As tolerated with Full fall precautions use walker/cane & assistance as needed  Disposition CIR/LTAC  Diet:   Diet NPO on medications and feeding through PEG tube   On your next visit with your primary care physician please Get Medicines reviewed and adjusted.  Please request your Prim.MD to go over all Hospital Tests and Procedure/Radiological results at the follow up, please get all Hospital records sent to your Prim MD by signing hospital release before you go home.  If you experience worsening of your admission symptoms, develop shortness of breath, life threatening emergency, suicidal or homicidal thoughts you must seek medical attention immediately by calling 911 or calling your MD immediately  if symptoms less severe.  You Must read complete instructions/literature along with all the possible adverse reactions/side effects for all the Medicines you take and that have been prescribed to you. Take any new Medicines after you have completely understood and accpet all the possible adverse reactions/side effects.   Do not drive, operate heavy machinery, perform activities at heights, swimming or participation in water activities or provide baby sitting services if your were admitted for syncope or siezures until you have seen by Primary MD or a Neurologist and advised to do so again.  Do not drive when taking Pain medications.    Do not take more than prescribed Pain, Sleep and Anxiety Medications  Special Instructions: If you have smoked or chewed Tobacco  in the last 2 yrs please  stop smoking, stop any regular Alcohol  and or any Recreational drug use.  Wear Seat belts while driving.   Please note  You were cared for by a hospitalist during your hospital stay. If you have any questions about your discharge medications or the care you received while you were in the hospital after you are discharged, you can call the unit and asked to speak with the hospitalist on call if the hospitalist that took care of you is not available. Once you are discharged, your primary care physician will handle any further medical issues. Please note that NO REFILLS for any discharge medications will be authorized once you are discharged, as it is imperative that you return to your primary care physician (or establish a relationship with a primary care physician if you do not have one) for your aftercare needs so that they can reassess your need for medications and monitor your lab values.

## 2017-02-03 NOTE — Progress Notes (Signed)
Physical Therapy Treatment Patient Details Name: Joanne Morrison MRN: [id number] DOB: March 21, 1951 Today's Date: 02/03/2017    History of Present Illness Patient is a 66 y.o. female with medical history significant of chronic low back pain, anxiety, depression, hypertension-recently had sorethroat (+strep) presented to the ED on 1/29 with lower ext weakness,tingling numbess that started in the lower extremities and subsequently started involving her upper extremities. Unable to elicit DTR in the lower ext. MRI of the entire spine negative. Concern for GBS-started on empiric IVIG by neurology.Developed respiratory failure and was intubated 2/1 and trasnferred to ICU.Marland Kitchen Trach 01/23/17.    PT Comments    Pt seen for mobility progression. Tolerated all activity on room air without difficulty. Saturations >94% throughout. HR elevated with activity upper 130s but resolved quickly with rest. Patient required less physical assist this session and tolerated extended about of activity including standing x3 with pre gait standing exercises. Patient very eager and excited about progression. At this time, given her prior level of function as as well as her significant progress over the past few days, feel the only appropriate venue for discharge is comprehensive inpatient rehab CIR as patient is very motivated, is tolerating increased activity on room air without issues and is able to tolerate OOB activity without difficulty. Will continue to see and progress as tolerated.  Follow Up Recommendations  CIR     Equipment Recommendations  Other (comment) (TBD, defer to next venue)    Recommendations for Other Services Rehab consult     Precautions / Restrictions Precautions Precautions: Fall Precaution Comments: trach Restrictions Weight Bearing Restrictions: No    Mobility  Bed Mobility Overal bed mobility: Needs Assistance Bed Mobility: Rolling;Sidelying to Sit Rolling: Min assist Sidelying to sit:  Min assist;+2 for physical assistance       General bed mobility comments: assist for LLE movement and trunk stability in elevating to upright  Transfers Overall transfer level: Needs assistance Equipment used: Rolling walker (2 wheeled);2 person hand held assist Transfers: Sit to/from Stand Sit to Stand: +2 physical assistance;Mod assist Stand pivot transfers: +2 physical assistance;Mod assist       General transfer comment: Assist to power up to standing, some intial instability requiring faciliatation of hip extension, cues for quad setting and bilateral LE knee blocking for stability. Patient was able to initate steps to chair with good clearance of RLE and some faciliatation of LLE.  Ambulation/Gait                 Stairs            Wheelchair Mobility    Modified Rankin (Stroke Patients Only)       Balance Overall balance assessment: Needs assistance Sitting-balance support: Feet supported;Bilateral upper extremity supported Sitting balance-Leahy Scale: Poor (progressed to fair with increased trunk control activities) Sitting balance - Comments: toelrated >15 mins EOB with activities  Postural control: Posterior lean Standing balance support: Bilateral upper extremity supported Standing balance-Leahy Scale: Poor Standing balance comment: Pt stood x 3 with increased time each attempt and able to perform pre gait activities with weight shifts during standing                    Cognition Arousal/Alertness: Awake/alert Behavior During Therapy: WFL for tasks assessed/performed Overall Cognitive Status: Within Functional Limits for tasks assessed                 General Comments: patient with significantly improved cognition this session, verbalizing throughout session and  very interactive    Exercises Other Exercises Other Exercises: pre gait standing exercises (bilateral weight shifts x5; 2 trials) Other Exercises: Long arc quads  EOB Other Exercises: Ankle pumps    General Comments        Pertinent Vitals/Pain Pain Assessment: Faces Faces Pain Scale: Hurts little more Pain Location: low back Pain Descriptors / Indicators: Grimacing Pain Intervention(s): Monitored during session    Home Living                      Prior Function            PT Goals (current goals can now be found in the care plan section) Acute Rehab PT Goals Patient Stated Goal: to get better and go on a cruise PT Goal Formulation: With patient Time For Goal Achievement: 02/16/17 Potential to Achieve Goals: Fair Progress towards PT goals: Progressing toward goals    Frequency    Min 3X/week      PT Plan Current plan remains appropriate    Co-evaluation PT/OT/SLP Co-Evaluation/Treatment: Yes Reason for Co-Treatment: Complexity of the patient's impairments (multi-system involvement);For patient/therapist safety PT goals addressed during session: Mobility/safety with mobility OT goals addressed during session: ADL's and self-care     End of Session Equipment Utilized During Treatment: Gait belt Activity Tolerance: Patient tolerated treatment well Patient left: in chair;with call bell/phone within reach;with family/visitor present Nurse Communication: Mobility status;Need for lift equipment       Time: [id number]    Charges:  $Therapeutic Activity: 8-22 mins                    G Codes:       Duncan Dull 2017-02-19, 1:07 PM Alben Deeds, [place] DPT  [phone]

## 2017-02-03 NOTE — Discharge Summary (Signed)
Joanne Morrison [id number] DOB: 07-22-1951 DOA: 01/12/2017  PCP: Jerlyn Ly, MD  Admit date: 01/12/2017  Discharge date: 02/03/2017  Admitted From: Home  Disposition:  LTAC/CIR   Recommendations for Outpatient Follow-up:   Follow up with PCP in 1-2 weeks  PCP Please obtain BMP/CBC, 2 view CXR in 1week,  (see Discharge instructions)   PCP Please follow up on the following pending results: Make sure she closely follows with neurology, must see neurology in 1-2 weeks   Home Health: none   Equipment/Devices: None  Consultations:  Neuro PCCM Opthalmology   Discharge Condition: Fair   CODE STATUS: Full   Diet Recommendation: Diet NPO all diet and medications via PEG tube   Significant Events: 1/29 admit to [facility]  1/30 LP - IVIG initiated 2/1 intubated 2/9 trach  Chief Complaint  Patient presents with  . Weakness     Brief history of present illness from the day of admission and additional interim summary    66 yo female presented with weakness and myalgias for 3 to 4 days. She had recently completed therapy for Strep throat. Concern was that she developed Guillain Barre syndrome, and she was started on IVIG 1/30. She progressed to respiratory failure on 2/01 and required intubation, thereafter she was trached, has a PEG tube. She has finished her IVIG treatments, all diet and medications via PEG tube. She also developed problems with corneal abrasion for which she was seen by pulmonary. She was transferred to my service on day 21 of her hospital stay today on 02/03/2017.                                                                 Hospital Course   Acute respiratory failure due to neuromuscular weakness presumed r/t GBS Vent and trach care per PCCM - appears stable at this time - tolerating  trach collar at time of my visit again today, tracheostomy was changed on 02/03/2017 by pulmonary, discussed case beside, cleared for discharge from pulmonary standpoint.   Guillain Barre syndrome  IVIG course completed per Neuro - no plans for plasma exchange unless 2-4 weeks go by and no progress - slowly improving as expected, continue PT, and required intense inpatient PT either at [facility] or CIR, has been trached and pegged, continue both, will follow with neurology outpatient in 1-[redacted] weeks along with pulmonary for trach care in 1-2 weeks. Currently nothing by mouth all medications and diet via PEG tube.  A-fib with RVR / Sinus Tachycardia, transient in ICU, no issue now. Likely due to underlying stress. This was addressed by ICU team.   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  Continue ointment both eyes q 4 hours as already planned"     Discharge diagnosis     Active Problems:   Depression   Chronic low back pain   Weakness   Hypokalemia   GBS (Guillain Barre syndrome) (HCC)   Quadriplegia and quadriparesis (HCC)   Acute respiratory failure (HCC)   Hyponatremia with decreased serum osmolality   Hyponatremia   Pressure injury of skin   Pneumonia   SOB (shortness of breath)   Weakness of right lower extremity   Encounter for central line placement   Encounter for feeding tube placement   History of ETT   Acute respiratory failure with hypoxia (HCC)    Guillain Barr syndrome (HCC)   Atrial fibrillation with RVR ([place])   Dysphagia, pharyngoesophageal phase    Discharge instructions    Discharge Instructions    Discharge instructions    Complete by:  As directed    Follow with Primary MD PERINI,MARK A, MD in 7 days   Get CBC, CMP, 2 view Chest X ray checked  by Primary MD or SNF MD in 5-7 days ( we routinely change or add medications that can affect your baseline labs and fluid status, therefore we recommend that you get the mentioned basic workup next visit with your PCP, your PCP may decide not to get them or add new tests based on their clinical decision)  Activity: As tolerated with Full fall precautions use walker/cane & assistance as needed  Disposition CIR/LTAC  Diet:   Diet NPO on medications and feeding through PEG tube   On your next visit with your primary care physician please Get Medicines reviewed and adjusted.  Please request your Prim.MD to go over all Hospital Tests and Procedure/Radiological results at the follow up, please get all Hospital records sent to your Prim MD by signing hospital release before you go home.  If you experience worsening of your admission symptoms, develop shortness of breath, life threatening emergency, suicidal or homicidal thoughts you must seek medical attention immediately by calling 911 or calling your MD immediately  if symptoms less severe.  You Must read complete instructions/literature along with all the possible adverse reactions/side effects for all the Medicines you take and that have been prescribed to you. Take any new Medicines after you have completely understood and accpet all the possible adverse reactions/side effects.   Do not drive, operate heavy machinery, perform activities at heights, swimming or participation in water activities or provide baby sitting services if your were admitted for syncope or siezures until you have seen by Primary MD or a Neurologist and advised to  do so again.  Do not drive when taking Pain medications.    Do not take more than prescribed Pain, Sleep and Anxiety Medications  Special Instructions: If you have smoked or chewed Tobacco  in the last 2 yrs please stop smoking, stop any regular Alcohol  and or any Recreational drug use.  Wear Seat belts while driving.   Please note  You were cared for by a hospitalist during your hospital stay. If you have any questions about your discharge medications or the  care you received while you were in the hospital after you are discharged, you can call the unit and asked to speak with the hospitalist on call if the hospitalist that took care of you is not available. Once you are discharged, your primary care physician will handle any further medical issues. Please note that NO REFILLS for any discharge medications will be authorized once you are discharged, as it is imperative that you return to your primary care physician (or establish a relationship with a primary care physician if you do not have one) for your aftercare needs so that they can reassess your need for medications and monitor your lab values.   Increase activity slowly    Complete by:  As directed       Discharge Medications   Allergies as of 02/03/2017      Reactions   Olanzapine Other (See Comments)   STIFF JOINTS, EXTREMITY WEAKNESS, MEMORY LOSS, LOSS OF BALANCE   Sumatriptan Other (See Comments)   REACTION: altered mental status   Jonna Coup [uretron D-s] Other (See Comments)   lethargic      Medication List    STOP taking these medications   azithromycin 250 MG tablet Commonly known as:  ZITHROMAX   benzonatate 100 MG capsule Commonly known as:  TESSALON   buPROPion 150 MG 24 hr tablet Commonly known as:  WELLBUTRIN XL   diazepam 10 MG tablet Commonly known as:  VALIUM   estradiol 0.1 MG/24HR patch Commonly known as:  VIVELLE-DOT   HYDROcodone-acetaminophen 5-325 MG tablet Commonly known as:   NORCO/VICODIN   levofloxacin 500 MG tablet Commonly known as:  LEVAQUIN   losartan 25 MG tablet Commonly known as:  COZAAR   predniSONE 10 MG tablet Commonly known as:  DELTASONE   PROMETHAZINE VC/CODEINE 6.25-5-10 MG/5ML Syrp   rizatriptan 10 MG tablet Commonly known as:  MAXALT   zolpidem 10 MG tablet Commonly known as:  AMBIEN     TAKE these medications   albuterol (2.5 MG/3ML) 0.083% nebulizer solution Commonly known as:  PROVENTIL Take 3 mLs (2.5 mg total) by nebulization every 2 (two) hours as needed for wheezing.   docusate 50 MG/5ML liquid Commonly known as:  COLACE Place 10 mLs (100 mg total) into feeding tube 2 (two) times daily as needed for mild constipation.   erythromycin ophthalmic ointment Place 1 application into both eyes every 4 (four) hours.   feeding supplement (JEVITY 1.2 CAL) Liqd Place 1,000 mLs into feeding tube continuous.   feeding supplement (PRO-STAT SUGAR FREE 64) Liqd Place 30 mLs into feeding tube 2 (two) times daily.   free water Soln Place 100 mLs into feeding tube every 8 (eight) hours.   hydrALAZINE 20 MG/ML injection Commonly known as:  APRESOLINE Inject 0.5 mLs (10 mg total) into the vein every 4 (four) hours as needed (sbp > 170).   insulin aspart 100 UNIT/ML injection Commonly known as:  NOVOLOG Every 6 hours, 140-199 - 2 units, 200-250 - 4 units, 251-299 - 6 units,  300-349 - 8 units,  350 or above 10 units. Dispense syringes and needles as needed, Ok to switch to PEN if approved. Substitute to any brand approved. DX DM2, Code E11.65   lidocaine 5 % Commonly known as:  LIDODERM Place 1 patch onto the skin daily. Remove & Discard patch within 12 hours or as directed by MD   LORazepam 1 MG tablet Commonly known as:  ATIVAN Place 1 tablet (1 mg total) into feeding tube every  12 (twelve) hours.   ondansetron 4 MG tablet Commonly known as:  ZOFRAN Place 1 tablet (4 mg total) into feeding tube every 6 (six) hours as needed  for nausea.   pantoprazole sodium 40 mg/20 mL Pack Commonly known as:  PROTONIX Place 20 mLs (40 mg total) into feeding tube daily at 12 noon.   sertraline 50 MG tablet Commonly known as:  ZOLOFT Place 1 tablet (50 mg total) into feeding tube at bedtime.       Follow-up Information    PERINI,MARK A, MD. Schedule an appointment as soon as possible for a visit in 1 week(s).   Specialty:  Internal Medicine Contact information: [place] [address] [place]. Schedule an appointment as soon as possible for a visit in 1 week(s).   Contact information: [address]     [place] [phone] [place]. Schedule an appointment as soon as possible for a visit in 1 week(s).   Specialty:  Pulmonary Disease Contact information: [address] [address] [phone]        MCCUEN,CHRISTINE L, MD. Schedule an appointment as soon as possible for a visit in 1 week(s).   Specialty:  Ophthalmology Contact information: [place] [address] [phone]           Major procedures and Radiology Reports - PLEASE review detailed and final reports thoroughly  -         Ct Abdomen Wo Contrast  Result Date: 01/21/2017 CLINICAL DATA:  Dysphagia EXAM: CT ABDOMEN WITHOUT CONTRAST TECHNIQUE: Multidetector CT imaging of the abdomen was performed following the standard protocol without IV contrast. COMPARISON:  None. FINDINGS: Lower chest: There is dense bibasilar atelectasis with air bronchograms suggesting pneumonia. Hepatobiliary: No focal hepatic lesions on noncontrast exam. Gallbladder normal. Pancreas: Normal pancreatic parenchymal intensity. No ductal dilatation or inflammation. The stomach in typical orientation first Spleen: Normal spleen. Adrenals/urinary tract: Adrenal glands and kidneys are normal. Stomach/Bowel: NG tube extends  into the gastric body. The stomach is in typical orientation. No hiatal hernia. The duodenum and limited view of the small bowel colon are unremarkable. Vascular/Lymphatic: Abdominal aortic normal caliber. No retroperitoneal periportal lymphadenopathy. Musculoskeletal: No aggressive osseous lesion IMPRESSION: 1. Dense bibasilar atelectasis with air bronchograms is concerning for bibasilar pneumonia. 2. Normal gastric anatomy with stomach in typical orientation. NG tube in stomach. These results will be called to the ordering clinician or representative by the Radiologist Assistant, and communication documented in the PACS or zVision Dashboard. Electronically Signed   By: Suzy Bouchard M.D.   On: 01/21/2017 17:06   Mr Jeri Cos [id number] Contrast  Result Date: 01/12/2017 CLINICAL DATA:  Bilateral lower extremity weakness and paresthesia EXAM: MRI HEAD WITHOUT AND WITH CONTRAST TECHNIQUE: Multiplanar, multiecho pulse sequences of the brain and surrounding structures were obtained without and with intravenous contrast. CONTRAST:  23mL MULTIHANCE GADOBENATE DIMEGLUMINE 529 MG/ML IV SOLN COMPARISON:  None. FINDINGS: Brain: The brain has normal appearance without evidence of malformation, atrophy, old or acute small or large vessel infarction, hemorrhage, hydrocephalus or extra-axial collection. No pituitary abnormality. No abnormal enhancement occurs. Vascular: Major vessels at the base of the brain show flow. Skull and upper cervical spine: Normal Sinuses/Orbits: Clear/ normal. Other: None significant. IMPRESSION: Normal exam. Electronically Signed   By: Nelson Chimes M.D.   On: 01/12/2017 16:37   Mr Cervical  Spine W Wo Contrast  Result Date: 01/12/2017 CLINICAL DATA:  Lower extremity abnormal sensation in weakness beginning 3 days ago which is worsening. EXAM: MRI CERVICAL SPINE WITHOUT AND WITH CONTRAST TECHNIQUE: Multiplanar and multiecho pulse sequences of the cervical spine, to include the craniocervical junction  and cervicothoracic junction, were obtained without and with intravenous contrast. CONTRAST:  22mL MULTIHANCE GADOBENATE DIMEGLUMINE 529 MG/ML IV SOLN COMPARISON:  None. FINDINGS: Study Sever's from motion degradation. Alignment: Straightening of the normal cervical lordosis. Vertebrae: No fracture or primary bone lesion. Some discogenic endplate edema at [id number] could contribute to neck pain. Cord: No cord compression or primary cord lesion. Posterior Fossa, vertebral arteries, paraspinal tissues: Negative Disc levels: Foramen magnum, C1-2 and C2-3: Unremarkable. Facet arthropathy at C2-3 right more than left but without stenosis. C3-4: Mild bulging of the disc. No central canal stenosis. Facet arthropathy right more than left. No neural compression. C4-5: Endplate osteophytes. No compressive narrowing of the canal or foramina. C5-6: Spondylosis with endplate osteophytes that efface the ventral subarachnoid space. AP diameter of the canal is 8 mm. No actual cord compression. Mild foraminal stenosis bilaterally. C6-7: Spondylosis with endplate osteophytes and bulging of the disc. Narrowing of the ventral subarachnoid space but no cord compression. Foramina sufficiently patent. C7-T1:  Mild bulging of the disc.  No neural compression. IMPRESSION: Chronic degenerative changes of the cervical spine with spondylosis from C3-4 through C6-7. No cord compression or primary cord lesion. Canal narrowing at C5-6 with AP diameter of 8 mm. The subarachnoid space is effaced but the cord is not compressed. Foraminal narrowing at this level could possibly affect either or both C6 nerve roots. Electronically Signed   By: Nelson Chimes M.D.   On: 01/12/2017 16:27   Mr Thoracic Spine W Wo Contrast  Result Date: 01/12/2017 CLINICAL DATA:  bilateral lower extremity weakness and paresthesia EXAM: MRI THORACIC SPINE WITH and without  CONTRAST 15 cc MultiHance TECHNIQUE: Multiplanar, multisequence MR imaging of the thoracic spine was  performed following the administration of intravenous contrast. COMPARISON:  None. FINDINGS: Alignment:  Mild thoracic curvature convex to the left. Vertebrae: No fracture or primary bone lesion in the thoracic region. Cord:  No cord compression or primary cord lesion. Paraspinal and other soft tissues: Negative Disc levels: No degenerative disc disease in the thoracic region. No bulge or herniation. No canal or foraminal stenosis. IMPRESSION: Negative MRI of the thoracic spine.  No stenosis.  No cord lesion. Electronically Signed   By: Nelson Chimes M.D.   On: 01/12/2017 16:34   Mr Lumbar Spine W Wo Contrast  Result Date: 01/12/2017 CLINICAL DATA:  Lower extremity decreased sensation an weakness. EXAM: MRI LUMBAR SPINE WITHOUT AND WITH CONTRAST TECHNIQUE: Multiplanar and multiecho pulse sequences of the lumbar spine were obtained without and with intravenous contrast. CONTRAST:  11mL MULTIHANCE GADOBENATE DIMEGLUMINE 529 MG/ML IV SOLN COMPARISON:  10/18/2016 FINDINGS: Segmentation:  L5 is transitional, as described previously. Alignment:  Curvature convex to the right with the apex at L1. Vertebrae:  No fracture or primary bone lesion. Conus medullaris: Extends to the L1 level and appears normal. Paraspinal and other soft tissues: Negative Disc levels: L1-2: Mild bulging of the disc.  No stenosis or neural compression. L2-3: Moderate bulging of the disc. Facet and ligamentous hypertrophy. Mild multifactorial stenosis. Some potential for neural compression in the lateral recesses, right more than left. L3-4: Endplate osteophytes and bulging of the disc. Facet and ligamentous hypertrophy. Narrowing of the lateral recesses that could cause neural compression,  right more than left. L4-5: Bilateral facet arthropathy with anterolisthesis of 7 mm. Bulging of the disc. Narrowing of the lateral recesses and neural foramina right more than left. Neural compression could occur at this level, particularly on the right.  L5-S1: Transitional and unremarkable. No apparent change since November. IMPRESSION: No change since November 2017. Transitional anatomy with L5 transitional. Bilateral facet arthropathy at L4-5 with anterolisthesis of 7 mm. Stenosis of the lateral recesses and foramina right more than left. Neural compression could occur at this level, particularly on the right. Lateral recess stenosis at L2-3 and L3-4 that could possibly cause neural compression. Electronically Signed   By: Nelson Chimes M.D.   On: 01/12/2017 16:32   Ir Gastrostomy Tube Mod Sed  Result Date: 01/26/2017 INDICATION: Workup for Guillain-Barre. Please perform percutaneous gastrostomy tube placement for enteric nutrition supplementation. EXAM: PULL TROUGH GASTROSTOMY TUBE PLACEMENT COMPARISON:  CT abdomen and pelvis - 01/22/2016 MEDICATIONS: Ancef 2 gm IV; Antibiotics were administered within 1 hour of the procedure. Glucagon 1 mg IV CONTRAST:  40 mL of Isovue 300 administered into the gastric lumen. ANESTHESIA/SEDATION: Moderate (conscious) sedation was employed during this procedure. A total of Versed 1 mg and Fentanyl 50 mcg was administered intravenously. Moderate Sedation Time: 10 minutes. The patient's level of consciousness and vital signs were monitored continuously by radiology nursing throughout the procedure under my direct supervision. FLUOROSCOPY TIME:  1 minute 42 seconds (18 mGy) COMPLICATIONS: None immediate. PROCEDURE: Informed written consent was obtained from the patient following explanation of the procedure, risks, benefits and alternatives. A time out was performed prior to the initiation of the procedure. Ultrasound scanning was performed to demarcate the edge of the left lobe of the liver. Maximal barrier sterile technique utilized including caps, mask, sterile gowns, sterile gloves, large sterile drape, hand hygiene and Betadine prep. The left upper quadrant was sterilely prepped and draped. An oral gastric catheter was  inserted into the stomach under fluoroscopy. The existing nasogastric feeding tube was removed. The left costal margin and air opacified transverse colon were identified and avoided. Air was injected into the stomach for insufflation and visualization under fluoroscopy. Under sterile conditions a 17 gauge trocar needle was utilized to access the stomach percutaneously beneath the left subcostal margin after the overlying soft tissues were anesthetized with 1% Lidocaine with epinephrine. Needle position was confirmed within the stomach with aspiration of air and injection of small amount of contrast. A single T tack was deployed for gastropexy. Over an Amplatz guide wire, a 9-French sheath was inserted into the stomach. A snare device was utilized to capture the oral gastric catheter. The snare device was pulled retrograde from the stomach up the esophagus and out the oropharynx. The 20-French pull-through gastrostomy was connected to the snare device and pulled antegrade through the oropharynx down the esophagus into the stomach and then through the percutaneous tract external to the patient. The gastrostomy was assembled externally. Contrast injection confirms position in the stomach. Several spot radiographic images were obtained in various obliquities for documentation. The patient tolerated procedure well without immediate post procedural complication. FINDINGS: After successful fluoroscopic guided placement, the gastrostomy tube is appropriately positioned with internal disc against the ventral aspect of the gastric lumen. IMPRESSION: Successful fluoroscopic insertion of a 20-French pull-through gastrostomy tube. The gastrostomy may be used immediately for medication administration and in 24 hrs for the initiation of feeds. Electronically Signed   By: Sandi Mariscal M.D.   On: 01/26/2017 14:53   Dg Chest [facility]  1 View  Result Date: 01/27/2017 CLINICAL DATA:  Guillain-Barre syndrome nonsmoker. Tracheostomy  patient. EXAM: PORTABLE CHEST 1 VIEW COMPARISON:  Portable chest x-ray of January 23, 2017 FINDINGS: The lungs are adequately inflated. There is persistent infiltrate at both bases. There is no pleural effusion. The heart and pulmonary vascularity are normal. The tracheostomy appliance tip projects at the superior margin of the clavicular heads. IMPRESSION: Persistent bibasilar atelectasis or pneumonia, slight interval improvement since yesterday's study. Electronically Signed   By: David  Martinique M.D.   On: 01/27/2017 07:01   Dg Chest Port 1 View  Result Date: 01/23/2017 CLINICAL DATA:  Status post tracheostomy. EXAM: PORTABLE CHEST 1 VIEW COMPARISON:  Film earlier today at 0430 hours FINDINGS: Tracheostomy present which appears in appropriate position. Right jugular central line shows stable positioning. Lungs show stable bilateral lower lobe consolidation, left greater than right. Stable probable small left pleural effusion. No edema or pneumothorax. IMPRESSION: Appropriate radiographic appearance of tracheostomy. Stable bilateral lower lobe pulmonary airspace disease. Electronically Signed   By: Aletta Edouard M.D.   On: 01/23/2017 10:53   Dg Chest Port 1 View  Result Date: 01/23/2017 CLINICAL DATA:  66 year old female admitted for lower extremity weakness, worsening myalgia and inability to walk. Recent strep pharyngitis. Suspected Guillain-Barre. Deterioration of respiratory status, subsequently intubated.  Initial encounter. EXAM: PORTABLE CHEST 1 VIEW COMPARISON:  01/22/2017 and earlier. FINDINGS: Portable AP semi upright view at 0429 hours. Stable endotracheal tube tip at the level the clavicles. Stable right IJ central line. Enteric tube courses to the abdomen and the side hole is at the level of the stomach. Dense left lung base opacity obscuring the left hemidiaphragm persists. Patchy and nodular right lung base opacity persists but visualization of the right hemidiaphragm has improved. No  pneumothorax or pulmonary edema. Possible small left pleural effusion. Stable cardiac size and mediastinal contours. IMPRESSION: 1.  Stable lines and tubes. 2. Mildly improved right lung base ventilation with residual reticulonodular opacity. Continued left lower lobe collapse/consolidation and probable small left pleural effusion. Electronically Signed   By: Genevie Ann M.D.   On: 01/23/2017 07:43   Dg Chest Port 1 View  Result Date: 01/22/2017 CLINICAL DATA:  Dysphagia and hypoxia EXAM: PORTABLE CHEST 1 VIEW COMPARISON:  01/21/2017 FINDINGS: Endotracheal tube, right-sided jugular central line and nasogastric catheter are again seen and stable. Cardiac shadow is stable. Bibasilar infiltrative changes are noted worse on the left than the right. Stable small left pleural effusion is noted. No new focal abnormality is seen. IMPRESSION: No significant change from the previous day. Electronically Signed   By: Inez Catalina M.D.   On: 01/22/2017 07:56   Dg Chest Port 1 View  Result Date: 01/21/2017 CLINICAL DATA:  Hypoxia EXAM: PORTABLE CHEST 1 VIEW COMPARISON:  January 20, 2017 FINDINGS: Endotracheal tube tip is 2.8 cm above carina. Nasogastric tube side port below the diaphragm. Central catheter tip is at the cavoatrial junction. No pneumothorax. There is patchy airspace opacity in both lower lung zone with small left pleural effusion. Lungs elsewhere are clear. Heart is mildly enlarged with pulmonary vascularity within normal limits. No adenopathy. No bone lesions. IMPRESSION: Tube and catheter positions as described without pneumothorax. Patchy airspace consolidation lung bases, stable. Small left pleural effusion. No new opacity. Stable cardiac prominence. Electronically Signed   By: Lowella Grip III M.D.   On: 01/21/2017 07:05   Dg Chest Port 1 View  Result Date: 01/20/2017 CLINICAL DATA:  Respiratory failure EXAM: PORTABLE CHEST 1 VIEW  COMPARISON:  01/19/2017 FINDINGS: Endotracheal tube tip is at the  clavicular heads. Right IJ central line with tip at the SVC level. An orogastric tube reaches stomach. Unchanged streaky basilar opacities, worse on the left where there is also air bronchograms. Heart size is likely normal given technique. No edema, effusion, or pneumothorax. IMPRESSION: 1. Stable positioning of tubes and central line. 2. Unchanged left more than right pneumonia or atelectasis. Electronically Signed   By: Monte Fantasia M.D.   On: 01/20/2017 07:13   Dg Chest Port 1 View  Result Date: 01/19/2017 CLINICAL DATA:  Shortness of breath, acute respiratory failure and pneumonia, Guillain-Barre syndrome EXAM: PORTABLE CHEST 1 VIEW COMPARISON:  Portable chest x-ray of January 18, 2017 FINDINGS: The lungs are reasonably well inflated. Bibasilar atelectasis or pneumonia persists. Small amount of pleural fluid on the left is suspected. The endotracheal tube tip lies 3.2 cm above the carina. The esophagogastric tube tip in proximal port project below the inferior margin of the image. The right internal jugular venous catheter tip projects over the middle portion of the SVC. IMPRESSION: Stable appearance of the chest. The support tubes are in reasonable position. Bibasilar atelectasis or pneumonia. Electronically Signed   By: David  Martinique M.D.   On: 01/19/2017 07:04   Dg Chest Port 1 View  Result Date: 01/18/2017 CLINICAL DATA:  Pneumonia. EXAM: PORTABLE CHEST 1 VIEW COMPARISON:  January 17, 2017 FINDINGS: Support apparatus including the ETT is stable. No pneumothorax. Infiltrate in the right lung base persist but is mildly improved. Increased opacity in the left lung base obscuring the left hemidiaphragm. No other change. IMPRESSION: 1. Stable support apparatus. 2. Persistent but mildly improved right basilar infiltrate. 3. Increased infiltrate in the left base. Electronically Signed   By: Dorise Bullion III M.D   On: 01/18/2017 07:49   Dg Chest Port 1 View  Result Date: 01/17/2017 CLINICAL DATA:   66 year old female admitted for lower extremity weakness, worsening myalgia and inability to walk. Recent strep pharyngitis. Suspected Guillain-Barre. Deterioration of respiratory stress status, subsequently intubated. Possible aspiration. Initial encounter. EXAM: PORTABLE CHEST 1 VIEW COMPARISON:  01/15/2017 and earlier. FINDINGS: Portable AP semi upright view at 0513 hours. Stable endotracheal tube tip at the level the clavicles. Enteric tube courses to the left abdomen, tip not included. Kyphotic positioning. Continued dense and confluent retrocardiac opacity. Increased patchy left perihilar opacity. Mildly increased patchy and veiling right lung opacity. No pneumothorax. No pulmonary edema. Stable cardiac size and mediastinal contours. IMPRESSION: 1.  Stable lines and tubes. 2. Continued left lower lobe collapse or consolidation. Interval worsening left perihilar and right lung base opacity which may represent a combination of airspace disease and small pleural effusion. Suspect bilateral pneumonia which could be the sequelae of aspiration in this clinical setting. Electronically Signed   By: Genevie Ann M.D.   On: 01/17/2017 07:17   Dg Chest Port 1 View  Result Date: 01/15/2017 CLINICAL DATA:  Central line placement. EXAM: PORTABLE CHEST 1 VIEW COMPARISON:  Chest x-ray from earlier today. FINDINGS: Endotracheal tube has been placed with tip well positioned less than 2 cm above the carina. Right IJ central line in place with tip adequately positioned at the level of the mid SVC. No pneumothorax appreciated. There is a dense opacity at the left lung base, atelectasis versus pneumonia. Probable small left pleural effusion as well. Right lung remains clear. Elevation the right hemidiaphragm is stable in the short-term interval. Osseous structures about the chest are unremarkable. IMPRESSION: 1. Right  IJ central line adequately positioned with tip at the level of the mid SVC. No pneumothorax seen. 2. Endotracheal  tube well positioned with tip just above the level of the carina. 3. Dense opacity at the left lung base, atelectasis versus pneumonia, probable small left pleural effusion. Electronically Signed   By: Franki Cabot M.D.   On: 01/15/2017 18:26   Dg Chest Port 1 View  Result Date: 01/15/2017 CLINICAL DATA:  Shortness of breath . EXAM: PORTABLE CHEST 1 VIEW COMPARISON:  No recent prior . FINDINGS: Mediastinum hilar structures are normal. Cardiomegaly with normal pulmonary vascularity. Low lung volumes. Mild bibasilar infiltrates. Small left pleural effusion cannot be excluded . IMPRESSION: Low lung volumes. Mild bibasilar infiltrates. Small left pleural effusion cannot be excluded. Electronically Signed   By: Marcello Moores  Register   On: 01/15/2017 13:12   Dg Abd Portable 1v  Result Date: 01/23/2017 CLINICAL DATA:  Feeding tube placement EXAM: PORTABLE ABDOMEN - 1 VIEW COMPARISON:  01/15/2017 FINDINGS: Feeding tube tip is in the distal stomach. Nonobstructive bowel gas pattern. IMPRESSION: Feeding tube tip in the distal stomach. Electronically Signed   By: Rolm Baptise M.D.   On: 01/23/2017 11:45   Dg Abd Portable 1v  Result Date: 01/15/2017 CLINICAL DATA:  Feeding tube placement. EXAM: PORTABLE ABDOMEN - 1 VIEW COMPARISON:  None. FINDINGS: Feeding tube appears appropriately positioned within the expected region of the stomach body. Visualized bowel gas pattern is nonobstructive. There is a patulous gas-filled loop of bowel within the left abdomen which likely has thickened walls. No evidence of soft tissue mass or abnormal fluid collection. No evidence free intraperitoneal air seen. Mild degenerative change noted within the scoliotic lumbar spine. IMPRESSION: 1. Feeding tube appears appropriately positioned in the left abdomen, expected region of the stomach body. 2. Probable bowel wall thickening in the left abdomen, uncertain if large or small bowel, suspicious for enteritis or colitis. Electronically Signed    By: Franki Cabot M.D.   On: 01/15/2017 20:52   Dg Fluoro Guide Lumbar Puncture  Result Date: 01/13/2017 CLINICAL DATA:  Sudden onset of inability to walk. Possible Guillain-Barre syndrome. EXAM: DIAGNOSTIC LUMBAR PUNCTURE UNDER FLUOROSCOPIC GUIDANCE FLUOROSCOPY TIME:  Fluoroscopy Time:  0 minutes and 42 seconds Radiation Exposure Index (if provided by the fluoroscopic device): 4.1 mGy Number of Acquired Spot Images: 0 PROCEDURE: Informed consent was obtained from the patient prior to the procedure, including potential complications of headache, allergy, and pain. With the patient prone, the lower back was prepped with Betadine. 1% Lidocaine was used for local anesthesia. Lumbar puncture was performed at the L3-4 level using a 20 gauge needle with return of clear CSF. 8.5 Ml of CSF were obtained for laboratory studies. The patient tolerated the procedure well and there were no apparent complications. IMPRESSION: Fluoroscopic guided lumbar puncture with 8.5 cc clear CSF obtained for appropriate laboratory evaluation. Electronically Signed   By: Marijo Sanes M.D.   On: 01/13/2017 16:49    Micro Results     No results found for this or any previous visit (from the past 240 hour(s)).  Today   Subjective    Joanne Morrison today has no headache,no chest abdominal pain,no new weakness tingling or numbness, feels much better    Objective   Blood pressure 133/84, pulse 99, temperature 98 F (36.7 C), temperature source Oral, resp. rate 18, height 5\' 2"  (1.575 m), weight 66.8 kg (147 lb 4.3 oz), SpO2 100 %.   Intake/Output Summary (Last 24 hours) at 02/03/17  [place] filed at 02/03/17 0900  Gross per 24 hour  Intake          2045.25 ml  Output              575 ml  Net          1470.25 ml    Exam Awake Alert, Oriented x 3, No new F.N deficits, Normal affect North.AT,L eye under patch, Trach site stable Supple Neck,No JVD, No cervical lymphadenopathy appriciated.  Symmetrical Chest wall  movement, Good air movement bilaterally, CTAB RRR,No Gallops,Rubs or new Murmurs, No Parasternal Heave +ve B.Sounds, Abd Soft, Non tender, No organomegaly appriciated, No rebound - guarding or rigidity. PEG in place No Cyanosis, Clubbing or edema, No new Rash or bruise   Data Review   CBC w Diff:  Lab Results  Component Value Date   WBC 11.3 (H) 01/31/2017   HGB 10.4 (L) 01/31/2017   HCT 33.4 (L) 01/31/2017   PLT 454 (H) 01/31/2017   LYMPHOPCT 15 01/28/2017   MONOPCT 7 01/28/2017   EOSPCT 0 01/28/2017   BASOPCT 0 01/28/2017    CMP:  Lab Results  Component Value Date   NA 139 02/02/2017   K 4.2 02/02/2017   CL 104 02/02/2017   CO2 28 02/02/2017   BUN 21 (H) 02/02/2017   CREATININE 0.51 02/02/2017   PROT 7.1 02/02/2017   ALBUMIN 2.4 (L) 02/02/2017   BILITOT 0.6 02/02/2017   ALKPHOS 86 02/02/2017   AST 46 (H) 02/02/2017   ALT 74 (H) 02/02/2017  .   Total Time in preparing paper work, data evaluation and todays exam - 35 minutes  Thurnell Lose M.D on 02/03/2017 at 10:39 AM  [facility]   Office  [phone]

## 2017-02-03 NOTE — Progress Notes (Signed)
Occupational Therapy Treatment Patient Details Name: Joanne Morrison MRN: [id number] DOB: 05/14/1951 Today's Date: 02/03/2017    History of present illness Patient is a 66 y.o. female with medical history significant of chronic low back pain, anxiety, depression, hypertension-recently had sorethroat (+strep) presented to the ED on 1/29 with lower ext weakness,tingling numbess that started in the lower extremities and subsequently started involving her upper extremities. Unable to elicit DTR in the lower ext. MRI of the entire spine negative. Concern for GBS-started on empiric IVIG by neurology.Developed respiratory failure and was intubated 2/1 and trasnferred to ICU.Marland Kitchen Trach 01/23/17.   OT comments  Pt making progress with functional goals. Tolerated activity on room air without difficulty. O2 SATs >94% throughout. HR elevated with activity upper 130s, however resolved quickly with rest. Patient required less physical assist this session and tolerated increased amount of activity including sit - stand x 3 the SPT to recliner. Washed face with min A hand over hand guiding. Patient very eager and excited about her progress. At this time, given her prior level of function as as well as her significant progress over the past few days, feel the only appropriate venue for discharge is comprehensive inpatient rehab CIR as patient is very motivated, is tolerating increased activity on room air without issues and is able to tolerate OOB activity without difficulty and pt's spouse able to provide support at home once d/c from CIR. OT wil continue to follow acutely  Follow Up Recommendations  CIR    Equipment Recommendations       Recommendations for Other Services Rehab consult    Precautions / Restrictions Precautions Precautions: Fall Precaution Comments: trach Required Braces or Orthoses: Other Brace/Splint Restrictions Weight Bearing Restrictions: No       Mobility Bed Mobility Overal bed  mobility: Needs Assistance Bed Mobility: Rolling;Sidelying to Sit Rolling: Min assist Sidelying to sit: Min assist;+2 for physical assistance       General bed mobility comments: assist for LLE movement and trunk stability in elevating to upright  Transfers Overall transfer level: Needs assistance Equipment used: Rolling walker (2 wheeled);2 person hand held assist Transfers: Sit to/from Stand Sit to Stand: +2 physical assistance;Mod assist Stand pivot transfers: +2 physical assistance;Mod assist       General transfer comment: Assist to power up to standing, some intial instability requiring faciliatation of hip extension, cues for quad setting and bilateral LE knee blocking for stability. Patient was able to initate steps to chair with good clearance of RLE and some faciliatation of LLE.    Balance Overall balance assessment: Needs assistance Sitting-balance support: Feet supported;Bilateral upper extremity supported Sitting balance-Leahy Scale: Poor Sitting balance - Comments: toelrated >15 mins EOB with activities  Postural control: Posterior lean Standing balance support: Bilateral upper extremity supported Standing balance-Leahy Scale: Poor Standing balance comment: Pt stood x 3 with increased time each attempt and able to perform pre gait activities with weight shifts during standing                   ADL Overall ADL's : Needs assistance/impaired     Grooming: Wash/dry face;Minimal assistance;Sitting Grooming Details (indicate cue type and reason): guiding, hand over hand with R UE                 Toilet Transfer: +2 for physical assistance;Stand-pivot Toilet Transfer Details (indicate cue type and reason): simulated to recliner Toileting- Clothing Manipulation and Hygiene: Total assistance  Cognition   Behavior During Therapy: WFL for tasks assessed/performed Overall Cognitive Status: Within Functional Limits  for tasks assessed                  General Comments: patient with significantly improved cognition this session, verbalizing throughout session and very interactive                 General Comments  pt very pleasant, cooperative and motivated. Husband very supportive    Pertinent Vitals/ Pain       Pain Assessment: Faces Faces Pain Scale: Hurts little more Pain Location: low back Pain Descriptors / Indicators: Grimacing Pain Intervention(s): Monitored during session  Home Living Family/patient expects to be discharged to:: Inpatient rehab Living Arrangements: Spouse/significant other Available Help at Discharge: Family;Available 24 hours/day Type of Home: House Home Access: Stairs to enter [employer] of Steps: 4 Entrance Stairs-Rails: Right;Left Home Layout: One level     Bathroom Shower/Tub: Tub/shower unit Shower/tub characteristics: Architectural technologist: Standard     Home Equipment: Shower seat;Grab bars - tub/shower          Prior Functioning/Environment Level of Independence: Independent            Frequency  Min 2X/week        Progress Toward Goals  OT Goals(current goals can now be found in the care plan section)  Progress towards OT goals: Progressing toward goals  Acute Rehab OT Goals Patient Stated Goal: to get better and go on a cruise  Plan Discharge plan remains appropriate    Co-evaluation    PT/OT/SLP Co-Evaluation/Treatment: Yes Reason for Co-Treatment: Complexity of the patient's impairments (multi-system involvement);For patient/therapist safety PT goals addressed during session: Mobility/safety with mobility OT goals addressed during session: ADL's and self-care      End of Session Equipment Utilized During Treatment: Oxygen  OT Visit Diagnosis: Muscle weakness (generalized) (M62.81)   Activity Tolerance Patient tolerated treatment well   Patient Left with call bell/phone within reach;with  family/visitor present;in chair   Nurse Communication      Functional Assessment Tool Used: AM-PAC 6 Clicks Daily Activity   Time: [id number] OT Time Calculation (min): 29 min  Charges: OT G-codes **NOT FOR INPATIENT CLASS** Functional Assessment Tool Used: AM-PAC 6 Clicks Daily Activity OT General Charges $OT Visit: 1 Procedure OT Treatments $Therapeutic Activity: 8-22 mins     Britt Bottom 02/03/2017, 1:56 PM

## 2017-02-03 NOTE — Progress Notes (Signed)
I met with pt and her spouse at bedside to discuss her progress so far. Pt speaking with PMSV and moving all extremities well. Pt and spouse are requesting an inpt rehab admission rather than  LTACH at this time. I contacted Dr. Candiss Norse to discuss holding [facility] admission for me to pursue a possible inpt rehab admission with [facility] which I will initiate now. Dr. Harland Dingwall to bedside and prefers an inpt rehab admission rather than [facility] at this time. I discussed with RN CM. [phone]

## 2017-02-03 NOTE — Progress Notes (Signed)
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  She reports that her eyes are feeling better.  No current pain!  OS is patched, but loosely.  OD:  Minimal injection of conjunctiva.  No corneal staining.  Weak blink, but she can get the lids closed.  OS: 2+ injection of conjunctiva.  Inferior epithelial defect continues to shrink.  Incomplete blink.  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  Patient is warming up to the idea.  We agreed that if I patched her overnight and she did not look better tomorrow, I would sew the left lids shut.   I placed erythromycin in both eyes and patched OS shut tightly.  I asked the nurses to keep the patch on.  I will remove it tomorrow.  The will continue the ointment OD q 4 hours.  Luberta Mutter, MD

## 2017-02-03 NOTE — Progress Notes (Signed)
Name: Joanne Morrison MRN: [id number] DOB: 1951-06-22    ADMISSION DATE:  01/12/2017 CONSULTATION DATE:  01/15/2017  REFERRING MD :  Sloan Leiter ([facility])   CHIEF COMPLAINT:  Dyspnea, potential for respiratory compromise  .     SUBJECTIVE:  Trach changed this am  Feels "100% better"  VITAL SIGNS: BP 133/84   Pulse 99   Temp 98 F (36.7 C) (Oral)   Resp 18   Ht 5\' 2"  (1.575 m)   Wt 147 lb 4.3 oz (66.8 kg)   SpO2 100%   BMI 26.94 kg/m   INTAKE / OUTPUT: I/O last 3 completed shifts: In: 1835.3 [NG/GT:1835.3] Out: 1125 [Urine:1125]  General appearance:  66 Year old  female, well nourished, NAD, conversant  Eyes: left eye is patched , moist conjunctivae; PERRL, EOMI bilaterally. Mouth:  membranes and no mucosal ulcerations; normal hard and soft palate Neck: Trachea midline; neck supple, no JVD, the trach site looks clean. She has a small pressure ulcer at 0600 below the trach. Now w/ #6 Portex  Lungs/chest: occ rhonchi that improve w/ cough, with normal respiratory effort and no intercostal retractions, phonation quality is 90-100% better  CV: RRR, no MRGs  Abdomen: Soft, non-tender; no masses or HSM Extremities: No peripheral edema or extremity lymphadenopathy Skin: Normal temperature, turgor and texture; no rash, ulcers or subcutaneous nodules Psych: Appropriate affect, alert and oriented to person, place and time  LABS:  BMET  Recent Labs Lab 01/31/17 0218 02/01/17 0203 02/02/17 0218  NA 146* 138 139  K 3.2* 3.3* 4.2  CL 110 106 104  CO2 26 26 28   BUN 32* 25* 21*  CREATININE 0.65 0.61 0.51  GLUCOSE 162* 122* 128*   Electrolytes  Recent Labs Lab 01/28/17 0307 01/29/17 0229 01/31/17 0218 02/01/17 0203 02/02/17 0218  CALCIUM 10.6* 10.4* 9.6 9.5 9.5  MG 2.3 2.4  --   --   --   PHOS 4.1  --   --   --   --    CBC  Recent Labs Lab 01/28/17 0307 01/29/17 0229 01/31/17 0218  WBC 13.3* 14.9* 11.3*  HGB 10.3* 10.7* 10.4*  HCT 33.4* 34.4* 33.4*  PLT 505*  512* 454*   Coag's No results for input(s): APTT, INR in the last 168 hours. Sepsis Markers No results for input(s): LATICACIDVEN, PROCALCITON, O2SATVEN in the last 168 hours. ABG No results for input(s): PHART, PCO2ART, PO2ART in the last 168 hours. Liver Enzymes  Recent Labs Lab 01/31/17 0218 02/01/17 0203 02/02/17 0218  AST 51* 41 46*  ALT 90* 74* 74*  ALKPHOS 98 89 86  BILITOT 0.5 0.7 0.6  ALBUMIN 2.4* 2.4* 2.4*   Cardiac Enzymes  Recent Labs Lab 01/31/17 0218 01/31/17 0818 01/31/17 1350  TROPONINI <0.03 <0.03 <0.03    Glucose  Recent Labs Lab 02/02/17 1123 02/02/17 1611 02/02/17 2034 02/03/17 0101 02/03/17 0452 02/03/17 0808  GLUCAP 125* 144* 125* 116* 151* 156*   Imaging No results found. 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  Resolved acute respiratory failure d/t NM weakness.  GBS Small pressure ulcer  at trach site.   discussion Lurline Idol now downsized to 6 Portex.   Plan Encourage PMV as much as possible during day-time hours. (have discussed this w/ husband) Suction PRN This trach needs to be humidified MINIMALLY at HS.  Routine trach care When she ambulatory begin capping trials to assess for extubation (ideally would like 24 hrs of capping w/out incident prior to decannulation) OK to dc respiratory mechanics   Erick Colace ACNP-BC [place] Pager # [phone] OR # [phone] if no answer  STAFF NOTE: I, Merrie Roof, MD FACP  have personally reviewed patient's available data, including medical history, events of note, physical examination and test results as part of my evaluation. I have discussed with resident/NP and other care providers such as pharmacist, RN and RRT. In addition, I personally evaluated patient and elicited key findings of: awake, looks wonderful, good spirits, clear lungs, edema mild, no new pcxr, no sig secretions, good air entry, coughing well, on trach collar days now, agree downsize indicated, phonates well, keep cuff down and assess PMV x full daytime for 2 days, then would cap if progressing with mobility, will follow, I updated pt and husband, she appears to be an excellent candidate rehab at [facility]. Titus Mould, MD, [place] Pgr: [facility] 02/03/2017 11:07 AM

## 2017-02-03 NOTE — Procedures (Signed)
Tracheostomy tube change: Informed verbal consent was obtained after explaining the risks (including bleeding and infection), benefits and alternatives of the procedure. Verbal timeout was performed prior to the procedure. The old  #6 cuffed trach was carefully removed. the tracheostomy site appeared: unremarkable w/ the exception of the small pressure ulcer d/t the trach flange at 0600. A new # 6 cuffless Portex trach was easily placed in the tracheostomy stoma and secured with velcro trach ties. The tracheostomy was patent, good color change observed via EZ-CAP, and the patient was easily able to voice with finger occlusion and tolerated the procedure well with no immediate complications.    Erick Colace ACNP-BC [place] Pager # [phone] OR # [phone] if no answer  Agree with placement, and type and size trach change, tolerated well  Lavon Paganini. Titus Mould, MD, [place] Pgr: [facility] 02/03/2017 11:18 AM

## 2017-02-03 NOTE — Progress Notes (Signed)
  Speech Language Pathology Treatment: Nada Boozer Speaking valve  Patient Details Name: Joanne Morrison MRN: [id number] DOB: [birth year] Today's Date: 02/03/2017 Time: [id number] SLP Time Calculation (min) (ACUTE ONLY): 8 min  Assessment / Plan / Recommendation Clinical Impression  Since trach change (cuffless 4 Portex), pt is wearing PMSV with ability to converse intelligibly and  demonstrates no intolerance or discomfort. Working with PT at time of brief SLP visit to update restriction and education family on precautions. Pt may wear PMSV during all waking hours. Husband verbalized understanding of precaution to remove with naps. He return demonstrated placement and removal. Will f/u tomorrow for MBS.    HPI HPI: Patient is a 66 y.o. female with medical history significant of chronic low back pain, anxiety, depression, hypertension-recently had sorethroat (+strep) presented to the ED on 1/29 with lower ext weakness,tingling numbess that started in the lower extremities and subsequently started involving her upper extremities. Unable to elicit DTR in the lower ext. MRI of the entire spine negative. 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 Progressed to respiratory failure and required intubation 2/1, trach 2/9, tolerating trach collar with vent on standby, per respiratory patient on 30% ATC pt tolerated well SATS 98%, HR 90, RR 23, NIF -22 cmH2O x3, VC 1.1L X3.       SLP Plan  MBS;Continue with current plan of care       Recommendations         Patient may use Passy-Muir Speech Valve: During all waking hours (remove during sleep);Caregiver trained to provide supervision PMSV Supervision: Full         SLP Visit Diagnosis: Aphonia (R49.1) Plan: MBS;Continue with current plan of care       GO               [facility], MA CCC-SLP [id number]  Lynann Beaver 02/03/2017, 11:36 AM

## 2017-02-04 ENCOUNTER — Encounter (HOSPITAL_COMMUNITY): Payer: Self-pay | Admitting: *Deleted

## 2017-02-04 ENCOUNTER — Inpatient Hospital Stay (HOSPITAL_COMMUNITY)
Admission: RE | Admit: 2017-02-04 | Discharge: 2017-02-20 | DRG: 095 | Disposition: A | Discharge status: Home / Self Care | Payer: BLUE CROSS/BLUE SHIELD | Source: Intra-hospital | Attending: Physical Medicine & Rehabilitation | Admitting: Physical Medicine & Rehabilitation

## 2017-02-04 ENCOUNTER — Inpatient Hospital Stay (HOSPITAL_COMMUNITY): Payer: Medicare Other

## 2017-02-04 DIAGNOSIS — R001 Bradycardia, unspecified: Secondary | ICD-10-CM | POA: Diagnosis not present

## 2017-02-04 DIAGNOSIS — R938 Abnormal findings on diagnostic imaging of other specified body structures: Secondary | ICD-10-CM

## 2017-02-04 DIAGNOSIS — G610 Guillain-Barre syndrome: Principal | ICD-10-CM

## 2017-02-04 DIAGNOSIS — R200 Anesthesia of skin: Secondary | ICD-10-CM | POA: Diagnosis not present

## 2017-02-04 DIAGNOSIS — Z79899 Other long term (current) drug therapy: Secondary | ICD-10-CM

## 2017-02-04 DIAGNOSIS — J9600 Acute respiratory failure, unspecified whether with hypoxia or hypercapnia: Secondary | ICD-10-CM | POA: Diagnosis not present

## 2017-02-04 DIAGNOSIS — G8929 Other chronic pain: Secondary | ICD-10-CM

## 2017-02-04 DIAGNOSIS — I4891 Unspecified atrial fibrillation: Secondary | ICD-10-CM

## 2017-02-04 DIAGNOSIS — M5440 Lumbago with sciatica, unspecified side: Secondary | ICD-10-CM | POA: Diagnosis not present

## 2017-02-04 DIAGNOSIS — R339 Retention of urine, unspecified: Secondary | ICD-10-CM | POA: Diagnosis not present

## 2017-02-04 DIAGNOSIS — R0902 Hypoxemia: Secondary | ICD-10-CM | POA: Diagnosis not present

## 2017-02-04 DIAGNOSIS — G8250 Quadriplegia, unspecified: Secondary | ICD-10-CM | POA: Diagnosis present

## 2017-02-04 DIAGNOSIS — Z930 Tracheostomy status: Secondary | ICD-10-CM

## 2017-02-04 DIAGNOSIS — F419 Anxiety disorder, unspecified: Secondary | ICD-10-CM

## 2017-02-04 DIAGNOSIS — L299 Pruritus, unspecified: Secondary | ICD-10-CM

## 2017-02-04 DIAGNOSIS — E871 Hypo-osmolality and hyponatremia: Secondary | ICD-10-CM | POA: Diagnosis not present

## 2017-02-04 DIAGNOSIS — F329 Major depressive disorder, single episode, unspecified: Secondary | ICD-10-CM

## 2017-02-04 DIAGNOSIS — F32A Depression, unspecified: Secondary | ICD-10-CM | POA: Diagnosis present

## 2017-02-04 DIAGNOSIS — R202 Paresthesia of skin: Secondary | ICD-10-CM

## 2017-02-04 DIAGNOSIS — B999 Unspecified infectious disease: Secondary | ICD-10-CM | POA: Diagnosis not present

## 2017-02-04 DIAGNOSIS — H02206 Unspecified lagophthalmos left eye, unspecified eyelid: Secondary | ICD-10-CM | POA: Diagnosis not present

## 2017-02-04 DIAGNOSIS — R9389 Abnormal findings on diagnostic imaging of other specified body structures: Secondary | ICD-10-CM

## 2017-02-04 DIAGNOSIS — N3020 Other chronic cystitis without hematuria: Secondary | ICD-10-CM | POA: Diagnosis not present

## 2017-02-04 DIAGNOSIS — R1310 Dysphagia, unspecified: Secondary | ICD-10-CM

## 2017-02-04 DIAGNOSIS — Z931 Gastrostomy status: Secondary | ICD-10-CM

## 2017-02-04 DIAGNOSIS — I10 Essential (primary) hypertension: Secondary | ICD-10-CM | POA: Diagnosis not present

## 2017-02-04 DIAGNOSIS — J9601 Acute respiratory failure with hypoxia: Secondary | ICD-10-CM | POA: Diagnosis not present

## 2017-02-04 DIAGNOSIS — S0502XA Injury of conjunctiva and corneal abrasion without foreign body, left eye, initial encounter: Secondary | ICD-10-CM | POA: Diagnosis not present

## 2017-02-04 DIAGNOSIS — R531 Weakness: Secondary | ICD-10-CM | POA: Diagnosis not present

## 2017-02-04 DIAGNOSIS — D62 Acute posthemorrhagic anemia: Secondary | ICD-10-CM

## 2017-02-04 DIAGNOSIS — Z7952 Long term (current) use of systemic steroids: Secondary | ICD-10-CM | POA: Diagnosis not present

## 2017-02-04 DIAGNOSIS — S0502XD Injury of conjunctiva and corneal abrasion without foreign body, left eye, subsequent encounter: Secondary | ICD-10-CM | POA: Diagnosis not present

## 2017-02-04 DIAGNOSIS — M79609 Pain in unspecified limb: Secondary | ICD-10-CM | POA: Diagnosis not present

## 2017-02-04 DIAGNOSIS — R5381 Other malaise: Secondary | ICD-10-CM | POA: Diagnosis not present

## 2017-02-04 DIAGNOSIS — S0502XS Injury of conjunctiva and corneal abrasion without foreign body, left eye, sequela: Secondary | ICD-10-CM | POA: Diagnosis not present

## 2017-02-04 DIAGNOSIS — M545 Low back pain: Secondary | ICD-10-CM

## 2017-02-04 DIAGNOSIS — R1314 Dysphagia, pharyngoesophageal phase: Secondary | ICD-10-CM | POA: Diagnosis not present

## 2017-02-04 DIAGNOSIS — R1312 Dysphagia, oropharyngeal phase: Secondary | ICD-10-CM | POA: Diagnosis not present

## 2017-02-04 DIAGNOSIS — F332 Major depressive disorder, recurrent severe without psychotic features: Secondary | ICD-10-CM | POA: Diagnosis not present

## 2017-02-04 LAB — CREATININE, SERUM
Creatinine, Ser: 0.6 mg/dL (ref 0.44–1.00)
GFR calc Af Amer: >60 mL/min (ref >60)
GFR calc non Af Amer: >60 mL/min (ref >60)

## 2017-02-04 LAB — CBC
HCT: 33.8 % — ABNORMAL LOW (ref 36.0–46.0)
Hemoglobin: 10.6 g/dL — ABNORMAL LOW (ref 12.0–15.0)
MCH: 26.8 pg (ref 26.0–34.0)
MCHC: 31.4 g/dL (ref 30.0–36.0)
MCV: 85.6 fL (ref 78.0–100.0)
Platelets: 416 10*3/uL — ABNORMAL HIGH (ref 150–400)
RBC: 3.95 MIL/uL (ref 3.87–5.11)
RDW: 17.7 % — AB (ref 11.5–15.5)
WBC: 10.7 10*3/uL — ABNORMAL HIGH (ref 4.0–10.5)

## 2017-02-04 LAB — GLUCOSE, CAPILLARY
GLUCOSE-CAPILLARY: 159 mg/dL — AB (ref 65–99)
Glucose-Capillary: 117 mg/dL — ABNORMAL HIGH (ref 65–99)
Glucose-Capillary: 121 mg/dL — ABNORMAL HIGH (ref 65–99)
Glucose-Capillary: 159 mg/dL — ABNORMAL HIGH (ref 65–99)
Glucose-Capillary: 129 mg/dL — ABNORMAL HIGH (ref 65–99)

## 2017-02-04 MED ORDER — DOCUSATE SODIUM 50 MG/5ML PO LIQD
100.0000 mg | Freq: Two times a day (BID) | ORAL | Status: DC | PRN
  Administered 2017-02-06: 100 mg via ORAL
  Filled 2017-02-04: qty 10

## 2017-02-04 MED ORDER — ALBUTEROL SULFATE (2.5 MG/3ML) 0.083% IN NEBU
2.5000 mg | INHALATION_SOLUTION | RESPIRATORY_TRACT | Status: DC | PRN
  Filled 2017-02-04: qty 3

## 2017-02-04 MED ORDER — HEPARIN SODIUM (PORCINE) 5000 UNIT/ML IJ SOLN
5000.0000 [IU] | Freq: Three times a day (TID) | INTRAMUSCULAR | Status: DC
  Administered 2017-02-04 – 2017-02-19 (×45): 5000 [IU] via SUBCUTANEOUS
  Filled 2017-02-04 (×46): qty 1

## 2017-02-04 MED ORDER — ONDANSETRON HCL 4 MG/2ML IJ SOLN: 4.0000 mg | Freq: Four times a day (QID) | INTRAMUSCULAR | Status: DC | PRN

## 2017-02-04 MED ORDER — HEPARIN SODIUM (PORCINE) 5000 UNIT/ML IJ SOLN: 5000.0000 [IU] | Freq: Three times a day (TID) | INTRAMUSCULAR | Status: DC

## 2017-02-04 MED ORDER — PANTOPRAZOLE SODIUM 40 MG PO PACK
40.0000 mg | PACK | Freq: Every day | ORAL | Status: DC
  Administered 2017-02-05 – 2017-02-16 (×12): 40 mg via ORAL
  Filled 2017-02-04 (×8): qty 20

## 2017-02-04 MED ORDER — FENTANYL 25 MCG/HR TD PT72
50.0000 ug | MEDICATED_PATCH | TRANSDERMAL | Status: DC
  Administered 2017-02-06 – 2017-02-18 (×5): 50 ug via TRANSDERMAL
  Filled 2017-02-04 (×5): qty 2

## 2017-02-04 MED ORDER — LIDOCAINE 5 % EX PTCH
1.0000 | MEDICATED_PATCH | CUTANEOUS | Status: DC
  Administered 2017-02-05 – 2017-02-18 (×14): 1 via TRANSDERMAL
  Filled 2017-02-04 (×15): qty 1

## 2017-02-04 MED ORDER — ONDANSETRON HCL 4 MG PO TABS: 4.0000 mg | ORAL_TABLET | Freq: Four times a day (QID) | ORAL | Status: DC | PRN

## 2017-02-04 MED ORDER — LORAZEPAM 1 MG PO TABS
1.0000 mg | ORAL_TABLET | Freq: Two times a day (BID) | ORAL | Status: DC
  Administered 2017-02-04 – 2017-02-20 (×31): 1 mg via ORAL
  Filled 2017-02-04 (×32): qty 1

## 2017-02-04 MED ORDER — ACETAMINOPHEN 160 MG/5ML PO SOLN
650.0000 mg | Freq: Four times a day (QID) | ORAL | Status: DC | PRN
  Administered 2017-02-04 – 2017-02-07 (×4): 650 mg via ORAL
  Filled 2017-02-04 (×4): qty 20.3

## 2017-02-04 MED ORDER — SERTRALINE HCL 50 MG PO TABS
50.0000 mg | ORAL_TABLET | Freq: Every day | ORAL | Status: DC
  Administered 2017-02-04 – 2017-02-19 (×16): 50 mg via ORAL
  Filled 2017-02-04 (×17): qty 1

## 2017-02-04 MED ORDER — SORBITOL 70 % SOLN
30.0000 mL | Freq: Every day | Status: DC | PRN
  Administered 2017-02-08: 30 mL via ORAL
  Filled 2017-02-04: qty 30

## 2017-02-04 MED ORDER — ERYTHROMYCIN 5 MG/GM OP OINT
1.0000 "application " | TOPICAL_OINTMENT | OPHTHALMIC | Status: DC
  Administered 2017-02-04 – 2017-02-20 (×90): 1 via OPHTHALMIC
  Filled 2017-02-04 (×3): qty 3.5

## 2017-02-04 NOTE — Progress Notes (Signed)
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  No current pain  OD:  No corneal staining,  Conjunctiva is clear, no lagophthalmos, + weak blink  OS:  2+ injection of conjunctiva, corneal abrasion smaller but still not healed.  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  Temporary tarsorrhaphy OS now.  After informed consent, I prepped and draped OS.  2% Lidocaine injected into left upper and lower lids.   Double armed 4-0 silk on with red rubber bolster used to secure lid margins together.  She has small nasal opening in which to place ointment and through which the cornea can be checked..  2.  Continue erythromycin ointment OU q 4 hours.  It CAN be placed into the left eye through the nasal opening between lids.   --Luberta Mutter [phone]

## 2017-02-04 NOTE — Progress Notes (Signed)
Charlett Blake, MD Physician Signed Physical Medicine and Rehabilitation  Consult Note Date of Service: 01/13/2017 11:49 AM  Related encounter: ED to Hosp-Admission (Discharged) from 01/12/2017 in [place] All Collapse All   [] Hide copied text [] Hover for attribution information      Physical Medicine and Rehabilitation Consult Reason for Consult: Possible GBS Referring Physician: [facility]   HPI: Joanne Morrison is a 66 y.o. right handed female with history of hypertension, depression chronic low back pain. Per chart review patient lives with spouse. One level home for steps to entry. Patient had been independent prior to admission living with spouse who works during the day. Present 01/12/2017 with lower extremity weakness/numbness left greater than right, myalgias, left upper extremity weakness for the past 3-5 days as well as urinary retention. By report recently placed on Zithromax for strep throat. She was seen in the [place] emergency room 01/11/2017 but was discharged to home before returning with increasing weakness. MRI of the brain and entire spine negative for any significant acute structural issues. No cord compression or primary cord lesions noted. 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 Placed on IVIG 5 doses. Physical therapy evaluation completed 01/13/2017 with recommendations of physical medicine rehabilitation consult. Discussed with Dr. Leonel Ramsay  who notes,CSF protein elevation without elevated WBC  Review of Systems  Constitutional: Negative for chills and fever.  HENT: Negative for hearing loss and tinnitus.   Eyes: Negative for blurred vision and double vision.  Respiratory: Positive for cough and shortness of breath.   Cardiovascular: Negative for chest pain, palpitations and leg swelling.  Gastrointestinal: Positive for constipation. Negative for nausea and vomiting.   Genitourinary:       Urinary retention  Musculoskeletal: Positive for back pain, joint pain, myalgias and neck pain.  Skin: Negative for rash.  Neurological: Positive for tingling and weakness. Negative for seizures and loss of consciousness.  Psychiatric/Behavioral: Positive for depression.       Anxiety  All other systems reviewed and are negative.      Past Medical History:  Diagnosis Date  . Anxiety   . Arthritis    back  . Chronic cystitis   . Chronic low back pain   . Depression   . Diverticulosis of colon   . Hemorrhoids   . History of colon polyps    09-24-2009  rectal hyperplastic polyp/   and 2016 non-cancerous  . History of endometriosis   . History of panic attacks   . Hyperlipidemia   . Osteopenia   . Pelvic pain   . Sensation of pressure in bladder area   . Wears glasses         Past Surgical History:  Procedure Laterality Date  . BUNIONECTOMY Left 2004 approx  . CARPAL TUNNEL RELEASE Bilateral right 2014;  left 2007  . COLONOSCOPY W/ POLYPECTOMY  09/24/2009  . CYSTO WITH HYDRODISTENSION [name] 10/15/2016   Procedure: CYSTOSCOPY/HYDRODISTENSION AND MARCAINE INSTILLATION;  Surgeon: Rana Snare, MD;  Location: [facility];  Service: Urology;  Laterality: [name];  . CYSTO/  HYDRODISTENTION/  INSTILLATION THERAPY  1997 approx  . HYSTEROSCOPY W/D&C  11/01/2004   POLYPECTOMY  . LAPAROSCOPIC VAGINAL HYSTERECTOMY WITH SALPINGO OOPHORECTOMY Bilateral 04/20/2012  . TUBAL LIGATION  yrs ago         Family History  Problem Relation Age of Onset  . Hypertension Mother   . Dementia Mother   .  Lung cancer Mother     smoker  . Osteoporosis Mother   . Heart attack Father 77  . Hypertension Brother   . Diabetes Maternal Aunt   . Cancer Maternal Uncle     RENAL  . Heart attack Maternal Grandmother 86  . Lung cancer Maternal Grandfather     Ecologist  . Pulmonary embolism Brother     ? etiology  . Anesthesia  problems Neg Hx   . Stroke Neg Hx    Social History:  reports that she has never smoked. She has never used smokeless tobacco. She reports that she does not drink alcohol or use drugs. Allergies:       Allergies  Allergen Reactions  . Olanzapine Other (See Comments)    STIFF JOINTS, EXTREMITY WEAKNESS, MEMORY LOSS, LOSS OF BALANCE  . Sumatriptan Other (See Comments)    REACTION: altered mental status  . Graciella Freer D-S] Other (See Comments)    lethargic         Medications Prior to Admission  Medication Sig Dispense Refill  . benzonatate (TESSALON) 100 MG capsule Take 100 mg by mouth 3 (three) times daily as needed for cough.    Marland Kitchen buPROPion (WELLBUTRIN XL) 150 MG 24 hr tablet Take 150 mg by mouth every morning.    . diazepam (VALIUM) 10 MG tablet Take 10 mg by mouth every 12 (twelve) hours as needed for anxiety.    Marland Kitchen estradiol (VIVELLE-DOT) 0.1 MG/24HR patch Place 1 patch onto the skin 2 (two) times a week.    Marland Kitchen HYDROcodone-acetaminophen (NORCO/VICODIN) 5-325 MG tablet Take 1-2 tablets by mouth every 6 (six) hours as needed for moderate pain.     Marland Kitchen losartan (COZAAR) 25 MG tablet Take 25 mg by mouth daily.    . Promethazine-Phenyleph-Codeine (PROMETHAZINE VC/CODEINE) 6.25-5-10 MG/5ML SYRP Take 5 mLs by mouth daily as needed for cough.    . rizatriptan (MAXALT) 10 MG tablet Take 1 tablet (10 mg total) by mouth as needed. May repeat in 2 hours if needed 10 tablet 1  . zolpidem (AMBIEN) 10 MG tablet Take 10 mg by mouth at bedtime.    Marland Kitchen azithromycin (ZITHROMAX) 250 MG tablet Take 250 mg by mouth daily.    Marland Kitchen levofloxacin (LEVAQUIN) 500 MG tablet Take 500 mg by mouth daily.    . predniSONE (DELTASONE) 10 MG tablet Take 10-40 mg by mouth daily.      Home: Home Living Family/patient expects to be discharged to:: Private residence Living Arrangements: Spouse/significant other Available Help at Discharge: Family, Available 24 hours/day (can take  LOA) Type of Home: House Home Access: Stairs to enter [employer] of Steps: 4 Entrance Stairs-Rails: Right, Left Home Layout: One level Bathroom Shower/Tub: Optometrist:  (spouse to measure doorways for w/c accessibility) Home Equipment: Shower seat, Grab bars - tub/shower  Functional History: Prior Function Level of Independence: Independent Functional Status:  Mobility: Bed Mobility Overal bed mobility: Needs Assistance Bed Mobility: Rolling, Sidelying to Sit, Sit to Supine Rolling: Min assist Sidelying to sit: Mod assist, HOB elevated Sit to supine: Mod assist General bed mobility comments: assist for legs off bed and trunk upright to sit, assist for legs into bed to supine Transfers Overall transfer level: Needs assistance Equipment used: None Transfers: Squat Pivot Transfers Squat pivot transfers: Max assist General transfer comment: lifting assist to scoot pivot along EOB toward [facility] Ambulation/Gait General Gait Details: unable  ADL:  Cognition: Cognition Overall Cognitive Status: Within Functional  Limits for tasks assessed Orientation Level: Oriented X4 Cognition Arousal/Alertness: Awake/alert Behavior During Therapy: Anxious Overall Cognitive Status: Within Functional Limits for tasks assessed  Blood pressure (!) 163/98, pulse 60, temperature 97.8 F (36.6 C), temperature source Axillary, resp. rate 20, height 5\' 2"  (1.575 m), weight 71.5 kg (157 lb 10.1 oz), SpO2 100 %. Physical Exam  Vitals reviewed. Constitutional: She is oriented to person, place, and time.  HENT:  Head: Normocephalic.  Eyes: EOM are normal.  Neck: Normal range of motion. Neck supple. No thyromegaly present.  Cardiovascular: Normal rate, regular rhythm and normal heart sounds.   Respiratory: Effort normal and breath sounds normal. No respiratory distress. She has no wheezes.  GI: Soft. Bowel sounds are normal. She  exhibits no distension.  Neurological: She is alert and oriented to person, place, and time.  Skin: Skin is warm and dry.  3 minus, right deltoid, bicep, tricep, 4 minus grip, 2 minus, left deltoid, bicep, tricep, 4 at the grip 3 minus bilateral hip flexion, 3 plus bilateral knee extension to minus bilateral ankle dorsiflexion, plantarflexion. Sensation is absent to light touch below the ankle bilaterally. Intact in the upper limbs and the proximal lower limbs. Decreased tone noted. Bilateral upper and lower extremities  Lab Results Last 24 Hours       Results for orders placed or performed during the hospital encounter of 01/12/17 (from the past 24 hour(s))  Comprehensive metabolic panel     Status: Abnormal   Collection Time: 01/12/17 12:04 PM  Result Value Ref Range   Sodium 139 135 - 145 mmol/L   Potassium 2.8 (L) 3.5 - 5.1 mmol/L   Chloride 107 101 - 111 mmol/L   CO2 20 (L) 22 - 32 mmol/L   Glucose, Bld 96 65 - 99 mg/dL   BUN 11 6 - 20 mg/dL   Creatinine, Ser 1.14 (H) 0.44 - 1.00 mg/dL   Calcium 9.1 8.9 - 10.3 mg/dL   Total Protein 6.7 6.5 - 8.1 g/dL   Albumin 4.0 3.5 - 5.0 g/dL   AST 28 15 - 41 U/L   ALT 18 14 - 54 U/L   Alkaline Phosphatase 46 38 - 126 U/L   Total Bilirubin 0.9 0.3 - 1.2 mg/dL   GFR calc non Af Amer 50 (L) >60 mL/min   GFR calc Af Amer 58 (L) >60 mL/min   Anion gap 12 5 - 15  CBC with Differential     Status: Abnormal   Collection Time: 01/12/17 12:04 PM  Result Value Ref Range   WBC 11.5 (H) 4.0 - 10.5 K/uL   RBC 5.34 (H) 3.87 - 5.11 MIL/uL   Hemoglobin 14.2 12.0 - 15.0 g/dL   HCT 42.9 36.0 - 46.0 %   MCV 80.3 78.0 - 100.0 fL   MCH 26.6 26.0 - 34.0 pg   MCHC 33.1 30.0 - 36.0 g/dL   RDW 13.6 11.5 - 15.5 %   Platelets 289 150 - 400 K/uL   Neutrophils Relative % 57 %   Neutro Abs 6.6 1.7 - 7.7 K/uL   Lymphocytes Relative 36 %   Lymphs Abs 4.1 (H) 0.7 - 4.0 K/uL   Monocytes Relative 6 %   Monocytes Absolute 0.7  0.1 - 1.0 K/uL   Eosinophils Relative 1 %   Eosinophils Absolute 0.1 0.0 - 0.7 K/uL   Basophils Relative 0 %   Basophils Absolute 0.0 0.0 - 0.1 K/uL  I-Stat CG4 Lactic Acid, ED     Status: Abnormal  Collection Time: 01/12/17 12:25 PM  Result Value Ref Range   Lactic Acid, Venous 2.22 (HH) 0.5 - 1.9 mmol/L   Comment NOTIFIED PHYSICIAN   TSH     Status: None   Collection Time: 01/12/17 10:15 PM  Result Value Ref Range   TSH 1.645 0.350 - 4.500 uIU/mL  CK     Status: Abnormal   Collection Time: 01/12/17 10:15 PM  Result Value Ref Range   Total CK 280 (H) 38 - 234 U/L  Vitamin B12     Status: None   Collection Time: 01/12/17 10:15 PM  Result Value Ref Range   Vitamin B-12 726 180 - 914 pg/mL  Basic metabolic panel     Status: Abnormal   Collection Time: 01/13/17  3:28 AM  Result Value Ref Range   Sodium 137 135 - 145 mmol/L   Potassium 3.3 (L) 3.5 - 5.1 mmol/L   Chloride 103 101 - 111 mmol/L   CO2 19 (L) 22 - 32 mmol/L   Glucose, Bld 110 (H) 65 - 99 mg/dL   BUN 8 6 - 20 mg/dL   Creatinine, Ser 0.95 0.44 - 1.00 mg/dL   Calcium 9.3 8.9 - 10.3 mg/dL   GFR calc non Af Amer >60 >60 mL/min   GFR calc Af Amer >60 >60 mL/min   Anion gap 15 5 - 15  CBC     Status: Abnormal   Collection Time: 01/13/17  3:28 AM  Result Value Ref Range   WBC 11.9 (H) 4.0 - 10.5 K/uL   RBC 5.36 (H) 3.87 - 5.11 MIL/uL   Hemoglobin 14.2 12.0 - 15.0 g/dL   HCT 42.2 36.0 - 46.0 %   MCV 78.7 78.0 - 100.0 fL   MCH 26.5 26.0 - 34.0 pg   MCHC 33.6 30.0 - 36.0 g/dL   RDW 13.6 11.5 - 15.5 %   Platelets 355 150 - 400 K/uL      Imaging Results (Last 48 hours)  Mr Jeri Cos Wo Contrast  Result Date: 01/12/2017 CLINICAL DATA:  Bilateral lower extremity weakness and paresthesia EXAM: MRI HEAD WITHOUT AND WITH CONTRAST TECHNIQUE: Multiplanar, multiecho pulse sequences of the brain and surrounding structures were obtained without and with intravenous contrast. CONTRAST:  2mL  MULTIHANCE GADOBENATE DIMEGLUMINE 529 MG/ML IV SOLN COMPARISON:  None. FINDINGS: Brain: The brain has normal appearance without evidence of malformation, atrophy, old or acute small or large vessel infarction, hemorrhage, hydrocephalus or extra-axial collection. No pituitary abnormality. No abnormal enhancement occurs. Vascular: Major vessels at the base of the brain show flow. Skull and upper cervical spine: Normal Sinuses/Orbits: Clear/ normal. Other: None significant. IMPRESSION: Normal exam. Electronically Signed   By: Nelson Chimes M.D.   On: 01/12/2017 16:37   Mr Cervical Spine W Wo Contrast  Result Date: 01/12/2017 CLINICAL DATA:  Lower extremity abnormal sensation in weakness beginning 3 days ago which is worsening. EXAM: MRI CERVICAL SPINE WITHOUT AND WITH CONTRAST TECHNIQUE: Multiplanar and multiecho pulse sequences of the cervical spine, to include the craniocervical junction and cervicothoracic junction, were obtained without and with intravenous contrast. CONTRAST:  53mL MULTIHANCE GADOBENATE DIMEGLUMINE 529 MG/ML IV SOLN COMPARISON:  None. FINDINGS: Study Sever's from motion degradation. Alignment: Straightening of the normal cervical lordosis. Vertebrae: No fracture or primary bone lesion. Some discogenic endplate edema at [id number] could contribute to neck pain. Cord: No cord compression or primary cord lesion. Posterior Fossa, vertebral arteries, paraspinal tissues: Negative Disc levels: Foramen magnum, C1-2 and C2-3: Unremarkable. Facet arthropathy at  C2-3 right more than left but without stenosis. C3-4: Mild bulging of the disc. No central canal stenosis. Facet arthropathy right more than left. No neural compression. C4-5: Endplate osteophytes. No compressive narrowing of the canal or foramina. C5-6: Spondylosis with endplate osteophytes that efface the ventral subarachnoid space. AP diameter of the canal is 8 mm. No actual cord compression. Mild foraminal stenosis bilaterally. C6-7: Spondylosis  with endplate osteophytes and bulging of the disc. Narrowing of the ventral subarachnoid space but no cord compression. Foramina sufficiently patent. C7-T1:  Mild bulging of the disc.  No neural compression. IMPRESSION: Chronic degenerative changes of the cervical spine with spondylosis from C3-4 through C6-7. No cord compression or primary cord lesion. Canal narrowing at C5-6 with AP diameter of 8 mm. The subarachnoid space is effaced but the cord is not compressed. Foraminal narrowing at this level could possibly affect either or both C6 nerve roots. Electronically Signed   By: Nelson Chimes M.D.   On: 01/12/2017 16:27   Mr Thoracic Spine W Wo Contrast  Result Date: 01/12/2017 CLINICAL DATA:  bilateral lower extremity weakness and paresthesia EXAM: MRI THORACIC SPINE WITH and without  CONTRAST 15 cc MultiHance TECHNIQUE: Multiplanar, multisequence MR imaging of the thoracic spine was performed following the administration of intravenous contrast. COMPARISON:  None. FINDINGS: Alignment:  Mild thoracic curvature convex to the left. Vertebrae: No fracture or primary bone lesion in the thoracic region. Cord:  No cord compression or primary cord lesion. Paraspinal and other soft tissues: Negative Disc levels: No degenerative disc disease in the thoracic region. No bulge or herniation. No canal or foraminal stenosis. IMPRESSION: Negative MRI of the thoracic spine.  No stenosis.  No cord lesion. Electronically Signed   By: Nelson Chimes M.D.   On: 01/12/2017 16:34   Mr Lumbar Spine W Wo Contrast  Result Date: 01/12/2017 CLINICAL DATA:  Lower extremity decreased sensation an weakness. EXAM: MRI LUMBAR SPINE WITHOUT AND WITH CONTRAST TECHNIQUE: Multiplanar and multiecho pulse sequences of the lumbar spine were obtained without and with intravenous contrast. CONTRAST:  44mL MULTIHANCE GADOBENATE DIMEGLUMINE 529 MG/ML IV SOLN COMPARISON:  10/18/2016 FINDINGS: Segmentation:  L5 is transitional, as described  previously. Alignment:  Curvature convex to the right with the apex at L1. Vertebrae:  No fracture or primary bone lesion. Conus medullaris: Extends to the L1 level and appears normal. Paraspinal and other soft tissues: Negative Disc levels: L1-2: Mild bulging of the disc.  No stenosis or neural compression. L2-3: Moderate bulging of the disc. Facet and ligamentous hypertrophy. Mild multifactorial stenosis. Some potential for neural compression in the lateral recesses, right more than left. L3-4: Endplate osteophytes and bulging of the disc. Facet and ligamentous hypertrophy. Narrowing of the lateral recesses that could cause neural compression, right more than left. L4-5: Bilateral facet arthropathy with anterolisthesis of 7 mm. Bulging of the disc. Narrowing of the lateral recesses and neural foramina right more than left. Neural compression could occur at this level, particularly on the right. L5-S1: Transitional and unremarkable. No apparent change since November. IMPRESSION: No change since November 2017. Transitional anatomy with L5 transitional. Bilateral facet arthropathy at L4-5 with anterolisthesis of 7 mm. Stenosis of the lateral recesses and foramina right more than left. Neural compression could occur at this level, particularly on the right. Lateral recess stenosis at L2-3 and L3-4 that could possibly cause neural compression. Electronically Signed   By: Nelson Chimes M.D.   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 Does the need for close, 24 hr/day medical supervision in concert with the patient's rehab needs make it unreasonable for this patient to be served in a less intensive setting? Yes 2. Co-Morbidities requiring supervision/potential complications: Lumbar spondylolisthesis, cervical stenosis 3. Due to bladder management, bowel management, safety, skin/wound care, disease management, medication administration, pain  management and patient education, does the patient require 24 hr/day rehab nursing? Yes 4. Does the patient require coordinated care of a physician, rehab nurse, PT (1-2 hrs/day, 5 days/week), OT (1-2 hrs/day, 5 days/week) and SLP (.5-1 hrs/day, 3 days/week) to address physical and functional deficits in the context of the above medical diagnosis(es)? Yes Addressing deficits in the following areas: balance, endurance, locomotion, strength, transferring, bowel/bladder control, bathing, dressing, feeding, grooming, toileting and psychosocial support 5. Can the patient actively participate in an intensive therapy program of at least 3 hrs of therapy per day at least 5 days per week? Yes 6. The potential for patient to make measurable gains while on inpatient rehab is good 7. Anticipated functional outcomes upon discharge from inpatient rehab are min assist  with PT, min assist with OT, modified independent and supervision with SLP. 8. Estimated rehab length of stay to reach the above functional goals is: 20-23d 9. Does the patient have adequate social supports and living environment to accommodate these discharge functional goals? Yes 10. Anticipated D/C setting: Home 11. Anticipated post D/C treatments: [facility] therapy 12. Overall Rehab/Functional Prognosis: good  RECOMMENDATIONS: This patient's condition is appropriate for continued rehabilitative care in the following setting: CIR Patient has agreed to participate in recommended program. Yes Note that insurance prior authorization may be required for reimbursement for recommended care.  CommentCathlyn Parsons., PA-C 01/13/2017    Revision History                        Routing History

## 2017-02-04 NOTE — Progress Notes (Signed)
Modified Barium Swallow Progress Note  Patient Details  Name: Joanne Morrison MRN: [id number] Date of Birth: 1951/12/04  Today's Date: 02/04/2017  Modified Barium Swallow completed.  Full report located under Chart Review in the Imaging Section.  Brief recommendations include the following:  Clinical Impression  Pt demonstrates mild deficits including decreased labial seal, intermittent delayed swallow initaiton and slightly decreased vestibular closure during the swallow. Epiglottis fully deflects, but pt still has mild penetration of thin liquids to the cords that is mostly expelled during the swallow. Vocal cord adduction is firm and only a very trace amount of liquid lingers on vocal folds post swallow intermittently, without sensation. A cued throat clear consistently expels penetrate. Pt will need a lidded sippy cup for self feeding as her UE control is a little unsteady for an open cup and she struggles with labial seal for a straw due to left CN VII weakness. Recommend pt initiate a regular diet and thin liquids with an intermittent throat clear. SLP to f/u at [name].    Swallow Evaluation Recommendations       SLP Diet Recommendations: Regular solids;Thin liquid   Liquid Administration via: Cup;Other (Comment) (lidded cup)   Medication Administration: Whole meds with puree   Supervision: Patient able to self feed   Compensations: Slow rate;Small sips/bites   Postural Changes: Remain semi-upright after after feeds/meals (Comment);Seated upright at 90 degrees   Oral Care Recommendations: Oral care BID       Herbie Baltimore, MA CCC-SLP [id number]  Hannan Tetzlaff, Katherene Ponto 02/04/2017,2:51 PM

## 2017-02-04 NOTE — Progress Notes (Signed)
[place] TEAM 1 - Stepdown/ICU TEAM  Joanne Morrison  ZTI:[id number] DOB: Nov 27, 1951 DOA: 01/12/2017 PCP: Jerlyn Ly, MD    Brief Narrative:  66 yo female presented with weakness and myalgias for 3 to 4 days. She had recently completed therapy for Strep throat. Concern was that she developed Guillain Barre syndrome, and she was started on IVIG 1/30. She progressed to respiratory failure on 2/01 and required intubation.  Significant Events: 1/29 admit to [facility]  1/30 LP - IVIG initiated 2/1 intubated 2/9 trach  Subjective: The patient has made dramatic improvement over last 48 hours.  Presently the plan is for discharge to CIR.  We are awaiting insurance authorization and bed availability.  The patient is medically stable for discharge.  No new complaints.  In good spirits.    Assessment & Plan:  Acute respiratory failure due to neuromuscular weakness presumed r/t GBS Vent and trach care per PCCM - appears stable at this time - tolerating trach collar and PMV training - trach changed by PCCM on 02/03/17  Guillain Barre syndrome  IVIG course completed per Neuro - no plans for plasma exchange unless 2-4 weeks go by and no progress - slowly improving as expected  A-fib with RVR / Sinus Tachycardia  Maintaining sinus rhythm at this time  Hx HTN  Blood pressure controlled   Hypernatremia  Corrected with free water expansion   Hypokalemia  Corrected - magnesium is normal  Transaminitis  Slowly improving - follow to normalization  Dysphagia - S/P PEG  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 rate 18, height _0  (1.575 m), weight 68.1 kg (150 lb 2.1 oz), SpO2 95 %.  Intake/Output Summary (Last 24 hours) at 02/04/17 1042 Last data filed at 02/04/17 1003  Gross per 24 hour  Intake             1000 ml  Output             1225 ml  Net             -225 ml   Filed Weights   02/02/17 0500 02/03/17 0413 02/04/17 0313  Weight: 68.5 kg (151 lb 0.2 oz) 66.8 kg (147 lb 4.3 oz) 68.1 kg (150 lb 2.1 oz)    Examination: General: No acute respiratory distress on TC - alert and conversant  Lungs: Clear to auscultation bilaterally Cardiovascular: Regular rate and rhythm without murmur   CBC:  Recent Labs Lab 01/29/17 0229 01/31/17 0218  WBC 14.9* 11.3*  HGB 10.7* 10.4*  HCT 34.4* 33.4*  MCV 86.6 86.5  PLT 512* 825*   Basic Metabolic Panel:  Recent Labs Lab 01/29/17 0229 01/31/17 0218 02/01/17 0203 02/02/17 0218  NA 150* 146* 138 139  K 3.5 3.2* 3.3* 4.2  CL 117* 110 106 104  CO2 _1 GLUCOSE 145* 162* 122* 128*  BUN 45* 32* 25* 21*  CREATININE 0.82 0.65 0.61  0.51  CALCIUM 10.4* 9.6 9.5 9.5  MG 2.4  --   --   --    GFR: Estimated Creatinine Clearance: 64.3 mL/min (by C-G formula based on SCr of 0.51 mg/dL).  Liver Function Tests:  Recent Labs Lab 01/29/17 0229 01/31/17 0218 02/01/17 0203 02/02/17 0218  AST 92* 51* 41 46*  ALT 144* 90* 74* 74*  ALKPHOS 118 98 89 86  BILITOT 0.6 0.5 0.7 0.6  PROT 8.5* 7.7 7.2 7.1  ALBUMIN 2.6* 2.4* 2.4* 2.4*   Cardiac Enzymes:  Recent Labs Lab 01/31/17 0218 01/31/17 0818 01/31/17 1350  TROPONINI <0.03 <0.03 <0.03    HbA1C: Hgb A1c MFr Bld  Date/Time Value Ref Range Status  12/05/2009 01:06 PM 5.5 4.6 - 6.5 % Final    Comment:    See lab report for  associated comment(s)  11/22/2008 12:00 AM 5.5 4.6 - 6.0 % Final    Comment:    See lab report for associated comment(s)    CBG:  Recent Labs Lab 02/03/17 1644 02/03/17 1942 02/03/17 2352 02/04/17 0411 02/04/17 0849  GLUCAP 147* 141* 117* 121* 159*    Scheduled Meds: . chlorhexidine gluconate (MEDLINE KIT)  15 mL Mouth Rinse BID  . erythromycin  1 application Both Eyes G8J  . feeding supplement (PRO-STAT SUGAR FREE 64)  30 mL Per Tube BID  . fentaNYL  50 mcg Transdermal Q72H  . free water  100 mL Per Tube Q8H  . heparin subcutaneous  5,000 Units Subcutaneous Q8H  . insulin aspart  0-15 Units Subcutaneous Q4H  . lidocaine  1 patch Transdermal Q24H  . LORazepam  1 mg Per Tube Q12H  . mouth rinse  15 mL Mouth Rinse 10 times per day  . pantoprazole sodium  40 mg Per Tube Q1200  . sertraline  50 mg Per Tube QHS    . feeding supplement (JEVITY 1.2 CAL) 1,000 mL (02/04/17 0532)    LOS: 22 days   Cherene Altes, MD [facility] Office  [phone] Pager - Text Page per Amion as per below:  On-Call/Text Page:      [email or web address]      password [email or web address]  If 7PM-7AM, please contact night-coverage [email or web address] Password [facility] 02/04/2017, 10:42 AM

## 2017-02-04 NOTE — Progress Notes (Addendum)
I have insurance approval to admit pt to inpt rehab today and bed is available. I have notified RN CM and will notify Dr. Thereasa Solo. [id number]

## 2017-02-04 NOTE — Progress Notes (Signed)
Patient ID: Joanne Morrison, female   DOB: 1951-03-12, 66 y.o.   MRN: [id number] Late entry. Patient received at 1655 via bed. Patient alert and oriented x 3-4. forgetfulness noted. Patient's left eye noted open and suture remains in eyelid. Oriented patient and husband to room and call bell system. Patient and husband verbalized understanding of admission process. Continue with plan of care.  Mliss Sax

## 2017-02-04 NOTE — Care Management Note (Signed)
Case Management Note  Patient Details  Name: Joanne Morrison MRN: [id number] Date of Birth: 10-24-1951  Subjective/Objective:  Adm w guillam barre                  Action/Plan: to go to inpt rehab, has trach and peg   Expected Discharge Date:  02/04/17               Expected Discharge Plan:  [place]  In-House Referral:  Clinical Social Work  Discharge planning Services  CM Consult  Post Acute Care Choice:    Choice offered to:     DME Arranged:    DME Agency:     HH Arranged:    [place] Agency:     Status of Service:  Completed, signed off  If discussed at [employer], dates discussed:    Additional Comments: cir got approval from [facility] for inpt rehab. Pt to be dc there today.  Lacretia Leigh, RN 02/04/2017, 12:27 PM

## 2017-02-04 NOTE — Progress Notes (Signed)
Joanne Gong, RN Rehab Admission Coordinator Signed Physical Medicine and Rehabilitation  PMR Pre-admission Date of Service: 02/04/2017 3:04 PM  Related encounter: ED to Hosp-Admission (Discharged) from 01/12/2017 in [place]       _0 Hide copied text PMR Admission Coordinator Pre-Admission Assessment Patient: Joanne Morrison is an 66 y.o., female MRN: [id number] DOB: 02-18-1951 Height: _1  (157.5 cm) Weight: 68.1 kg (150 lb 2.1 oz)                                                                                                                                                  Insurance Information HMO:     PPO: yes     PCP:      IPA:      80/20:      OTHER:  PRIMARY: [employer] [place]      Policy#: [id number]      Subscriber: spouse CM Name: Maudie Mercury      Phone#: [phone]     Fax#: [phone] Pre-Cert#: [id number] approved 21/21 thru 2/18 when updates are due 2/27      Employer:  Benefits:  Phone #: [phone]     Name: 02/03/17 Eff. Date: 12/16/11     Deduct: $500      Out of Pocket Max: $3000      Life Max: none CIR: 80%      SNF: 80% 120 days Outpatient: $50 co pay per visit     Co-Pay: 120 visits combined Home Health: 80%      Co-Pay: 20% 120 days DME: 80%     Co-Pay: [phone] Providers: in network  SECONDARY: Medicare part A only      Policy#: [id number]      Subscriber: pt  Medicaid Application Date:       Case Manager:  Disability Application Date:       Case Worker:   Emergency Contact Information        Contact Information    Name Relation Home Work [name] Spouse [phone]    No name specified         Current Medical History  Patient Admitting Diagnosis: Quadriparesis secondary to guillain-Barre syndrome  History of Present Illness:    : HPI: Joanne Morrison a 66 y.o.right handed femalewith history of Chronic cystitis,hypertension, depression chronic low back pain.  Present  01/12/2017 with lower extremity weakness/numbness left greater than right, myalgias, left upper extremity weakness for the past 3-5 days as well as urinary retention. By report recently placed on Zithromax for strep throat. She was seen in the [facility] emergency room 01/11/2017 but was discharged to home before returning with increasing weakness. MRI of the brain and entire spine negative for any significant acute structural issues. No cord compression or primary cord lesions noted. Neurology consulted and noted patient  to be areflexic of the lower extremities suspect GBS. Placed on IVIG 5 doses.Lumbar puncture notes CSF protein elevation without elevated WBC.Physical therapy evaluation completed 01/13/2017 with recommendations of physical medicine rehabilitation consult.On 01/15/2017 develop increasing weakness and difficulty with secretions pulmonary care medicine consulted for deterioration in respiratory status. She was intubated with ventilatory support. She again continued on IVIG per neurology. Found to have profound hyponatremia 122 placed on normal saline IV fluids and follow-up assistance provided by nephrology services for her hyponatremia.. Initially anasogastric tube wasplaced for nutritional support. Patient had a run of nonsustained episodes of atrial fibrillation on telemetry as well as bouts of bradycardia of 45. Cardiology service consulted 2/8/2018and continues to monitor with conservative care and no further workup indicated. Echocardiogram completed with ejection fraction of 70% no wall motion abnormalities. Prolonged intubation underwent tracheostomy 01/23/2017 per Dr. Nelda Marseille as well as placement of gastrostomy tube 01/26/2017 per interventional radiologyand barium swallow 02/21/2018with diet advanced to regular. Her tracheostomy was changed to a #6 cuff less Portex02/20/2018 per Dr. Titus Mould.Dr. Ronny Bacon. Ophthalmology consulted 01/26/2017 for left eyecorneal abrasions  secondary to lagophthalmos(eyelidsnot closing)and underwent tarorhaphy02/21/2018.Patient was placed on erythromycin ointment as directed. Subcutaneous heparin for DVT prophylaxis.  Past Medical History      Past Medical History:  Diagnosis Date  . Anxiety   . Arthritis    back  . Chronic cystitis   . Chronic low back pain   . Depression   . Diverticulosis of colon   . Hemorrhoids   . History of colon polyps    09-24-2009  rectal hyperplastic polyp/   and 2016 non-cancerous  . History of endometriosis   . History of panic attacks   . Hyperlipidemia   . Osteopenia   . Pelvic pain   . Sensation of pressure in bladder area   . Wears glasses     Family History  family history includes Cancer in her maternal uncle; Dementia in her mother; Diabetes in her maternal aunt; Heart attack (age of onset: 74) in her father; Heart attack (age of onset: 66) in her maternal grandmother; Hypertension in her brother and mother; Lung cancer in her maternal grandfather and mother; Osteoporosis in her mother; Pulmonary embolism in her brother.  Prior Rehab/Hospitalizations:  Has the patient had major surgery during 100 days prior to admission? No  Current Medications   Current Facility-Administered Medications:  .  acetaminophen (TYLENOL) solution 650 mg, 650 mg, Per Tube, Q6H PRN, Cherene Altes, MD, 650 mg at 02/03/17 1530 .  albuterol (PROVENTIL) (2.5 MG/3ML) 0.083% nebulizer solution 2.5 mg, 2.5 mg, Nebulization, Q2H PRN, Jonetta Osgood, MD .  chlorhexidine gluconate (MEDLINE KIT) (PERIDEX) 0.12 % solution 15 mL, 15 mL, Mouth Rinse, BID, Erick Colace, NP, 15 mL at 02/03/17 1938 .  docusate (COLACE) 50 MG/5ML liquid 100 mg, 100 mg, Per Tube, BID PRN, Cherene Altes, MD .  erythromycin ophthalmic ointment 1 application, 1 application, Both Eyes, Q4H, Luberta Mutter, MD, 1 application at 28/41/32 1206 .  feeding supplement (JEVITY 1.2 CAL) liquid 1,000 mL,  1,000 mL, Per Tube, Continuous, Cherene Altes, MD, Last Rate: 45 mL/hr at 02/04/17 0532, 1,000 mL at 02/04/17 0532 .  feeding supplement (PRO-STAT SUGAR FREE 64) liquid 30 mL, 30 mL, Per Tube, BID, Marijean Heath, NP, 30 mL at 02/04/17 0858 .  fentaNYL (DURAGESIC - dosed mcg/hr) 50 mcg, 50 mcg, Transdermal, Q72H, Raylene Miyamoto, MD, 50 mcg at 02/03/17 0911 .  fentaNYL (SUBLIMAZE) injection 50 mcg,  50 mcg, Intravenous, Q2H PRN, Cherene Altes, MD, 50 mcg at 02/01/17 1940 .  free water 100 mL, 100 mL, Per Tube, Q8H, Cherene Altes, MD, 100 mL at 02/04/17 0600 .  heparin injection 5,000 Units, 5,000 Units, Subcutaneous, Q8H, Monia Sabal, PA-C, 5,000 Units at 02/04/17 0532 .  hydrALAZINE (APRESOLINE) injection 10 mg, 10 mg, Intravenous, Q4H PRN, Erick Colace, NP, 10 mg at 01/25/17 1008 .  insulin aspart (novoLOG) injection 0-15 Units, 0-15 Units, Subcutaneous, Q4H, Erick Colace, NP, 3 Units at 02/04/17 1212 .  lidocaine (LIDODERM) 5 % 1 patch, 1 patch, Transdermal, Q24H, Jonetta Osgood, MD, 1 patch at 02/03/17 1700 .  LORazepam (ATIVAN) injection 0.5 mg, 0.5 mg, Intravenous, Q6H PRN, Cherene Altes, MD, 0.5 mg at 02/03/17 1530 .  LORazepam (ATIVAN) tablet 1 mg, 1 mg, Per Tube, Q12H, Raylene Miyamoto, MD, 1 mg at 02/04/17 1000 .  MEDLINE mouth rinse, 15 mL, Mouth Rinse, 10 times per day, Erick Colace, NP, 15 mL at 02/04/17 1000 .  ondansetron (ZOFRAN) tablet 4 mg, 4 mg, Per Tube, Q6H PRN **OR** ondansetron (ZOFRAN) injection 4 mg, 4 mg, Intravenous, Q6H PRN, Cherene Altes, MD .  pantoprazole sodium (PROTONIX) 40 mg/20 mL oral suspension 40 mg, 40 mg, Per Tube, Q1200, Erick Colace, NP, 40 mg at 02/04/17 1212 .  sertraline (ZOLOFT) tablet 50 mg, 50 mg, Per Tube, QHS, Cherene Altes, MD, 50 mg at 02/03/17 2213  Patients Current Diet: Diet regular Room service appropriate? Yes; Fluid consistency: Thin  Precautions / Restrictions Precautions Precautions:  Fall Precaution Comments: trach Other Brace/Splint: B PRAFO  Restrictions Weight Bearing Restrictions: No   Has the patient had 2 or more falls or a fall with injury in the past year?No  Prior Activity Level Community (5-7x/wk): Pt independent and driving pta. No AD. Retired 5 years ago. chronic back isssues  Development worker, international aid / Equipment Home Assistive Devices/Equipment: None Home Equipment: Shower seat, Grab bars - tub/shower  Prior Device Use: Indicate devices/aids used by the patient prior to current illness, exacerbation or injury? None of the above  Prior Functional Level Prior Function Level of Independence: Independent  Self Care: Did the patient need help bathing, dressing, using the toilet or eating?  Independent  Indoor Mobility: Did the patient need assistance with walking from room to room (with or without device)? Independent  Stairs: Did the patient need assistance with internal or external stairs (with or without device)? Independent  Functional Cognition: Did the patient need help planning regular tasks such as shopping or remembering to take medications? Independent  Current Functional Level Cognition  Overall Cognitive Status: Within Functional Limits for tasks assessed Difficult to assess due to: Tracheostomy Orientation Level: Oriented X4 General Comments: patient with significantly improved cognition this session, verbalizing throughout session and very interactive    Extremity Assessment (includes Sensation/Coordination)  Upper Extremity Assessment: RUE deficits/detail, LUE deficits/detail RUE Deficits / Details: Reports sensation in tact. Grasp strength 3+/5. AAROM WFL, strength shoulder flexion 2+/5, elbow flex 4-/5, ext 3+/5 RUE Sensation: decreased proprioception RUE Coordination: decreased fine motor LUE Deficits / Details: Reports sensation in tact; grasp strength 3+/5AAROM WFL, strength shoulder flex 2-/5, elbow flex 4-/5, ext  2+/5, grip weaker than R LUE Coordination: decreased fine motor  Lower Extremity Assessment: Defer to PT evaluation RLE Deficits / Details: AAROM WFL, strength hip flexion 3-/5, knee extension 4/5, ankle DF 3+/5 RLE Sensation: decreased light touch LLE Deficits / Details: AAROM  WFL, strength hip flexion 2+/5, knee extension 4-/5, ankle DF 3-/5 LLE Sensation: decreased light touch    ADLs  Overall ADL's : Needs assistance/impaired Eating/Feeding: Sitting, Minimal assistance Grooming: Wash/dry face, Minimal assistance, Sitting Grooming Details (indicate cue type and reason): guiding, hand over hand with R UE Upper Body Bathing: Maximal assistance, Sitting Lower Body Bathing: Total assistance, Bed level Upper Body Dressing : Maximal assistance, Sitting Lower Body Dressing: Total assistance, Bed level Toilet Transfer: +2 for physical assistance, Stand-pivot Toilet Transfer Details (indicate cue type and reason): simulated to recliner Toileting- Clothing Manipulation and Hygiene: Total assistance General ADL Comments: Total A at this time    Mobility  Overal bed mobility: Needs Assistance Bed Mobility: Rolling, Sidelying to Sit Rolling: Min assist Sidelying to sit: Min assist, +2 for physical assistance Supine to sit: Total assist, +2 for physical assistance Sit to supine: +2 for physical assistance, Total assist Sit to sidelying: +2 for physical assistance, Mod assist General bed mobility comments: assist for LLE movement and trunk stability in elevating to upright    Transfers  Overall transfer level: Needs assistance Equipment used: Rolling walker (2 wheeled), 2 person hand held assist Transfer via Lift Equipment: Stedy Transfers: Sit to/from Stand Sit to Stand: +2 physical assistance, Mod assist Stand pivot transfers: +2 physical assistance, Mod assist Squat pivot transfers: Max assist  Lateral/Scoot Transfers: +2 physical assistance, Total assist General transfer  comment: Assist to power up to standing, some intial instability requiring faciliatation of hip extension, cues for quad setting and bilateral LE knee blocking for stability. Patient was able to initate steps to chair with good clearance of RLE and some faciliatation of LLE.    Ambulation / Gait / Stairs / Wheelchair Mobility  Ambulation/Gait General Gait Details: unable    Posture / Balance Dynamic Sitting Balance Sitting balance - Comments: toelrated >15 mins EOB with activities  Balance Overall balance assessment: Needs assistance Sitting-balance support: Feet supported, Bilateral upper extremity supported Sitting balance-Leahy Scale: Poor Sitting balance - Comments: toelrated >15 mins EOB with activities  Postural control: Posterior lean Standing balance support: Bilateral upper extremity supported Standing balance-Leahy Scale: Poor Standing balance comment: Pt stood x 3 with increased time each attempt and able to perform pre gait activities with weight shifts during standing    Special needs/care consideration BiPAP/CPAP  [name] CPM  [name] Continuous Drip IV  [name] Dialysis  [name] Life Vest  [name] Oxygen 28 % trach collar Special Bed  Overlay mattress Trach Size #6 cuff less portex trach 28 % trach collar. Using PMSV Wound Vac (area)  [name] Skin abrasions bilateral knees, ecchymosis Bilateral Upper extremities,Gtube site                        Bowel mgmt:LBM 2/19 continent bladder mgmt:continent Diabetic mgmt  [name] Left eye stitched closed 01/04/2017   Previous Home Environment Living Arrangements: Spouse/significant other  Lives With: Spouse Available Help at Discharge:  (spouse works 130 pm until 76 am. pt's sister may help or h) Type of Home: House Home Layout: One level Home Access: Stairs to enter Entrance Stairs-Rails: Right, Left Entrance Stairs-Number of Steps: 4 Bathroom Shower/Tub: Tub/shower unit, Architectural technologist: Standard Bathroom Accessibility: Yes How  Accessible: Accessible via walker Parker: No  Discharge Living Setting Plans for Discharge Living Setting: Patient's home, Lives with (comment) (spouse) Type of Home at Discharge: House Discharge Home Layout: One level Discharge Home Access: Stairs to enter Entrance Stairs-Rails: Right, Left,  Can reach both Entrance Stairs-Number of Steps: 4 Discharge Bathroom Shower/Tub: Tub/shower unit, Curtain Discharge Bathroom Toilet: Standard Discharge Bathroom Accessibility: Yes How Accessible: Accessible via walker Does the patient have any problems obtaining your medications?: No  Social/Family/Support Systems Patient Roles: Spouse, Parent (pt has a son) Contact Information: spouse Anticipated Caregiver: spouse may take FMLA, sister ,ay assist or hired help Anticipated Caregiver's Contact Information: see above Ability/Limitations of Caregiver: spouse works 130 pm until 83 am. Pt's sister not close to her until pt's admission. May have to hire help Caregiver Availability: 24/7 Discharge Plan Discussed with Primary Caregiver: Yes Is Caregiver In Agreement with Plan?: Yes Does Caregiver/Family have Issues with Lodging/Transportation while Pt is in Rehab?: No  Goals/Additional Needs Patient/Family Goal for Rehab: supervision to min assist with PT, OT, and SLP Expected length of stay: [facility]OS 20-23 days Equipment Needs: left eye stiched closed today 02/04/17 Pt/Family Agrees to Admission and willing to participate: Yes Program Orientation Provided & Reviewed with Pt/Caregiver Including Roles  & Responsibilities: Yes  Decrease burden of Care through IP rehab admission: n/a  Possible need for SNF placement upon discharge: I spoke with pt's spouse on 02/04/2017 to discuss that [facility] will not approve an inpt rehab admission and then SNF rehab afterwards typically. Pt would need 24/7 care after discharge form spouse, other family members or hired help. Pt and her sister live 5 mins  apart, but have been estranged until this admission. Spouse states she may be of some help, he can take some FMLA, or hire help.  Patient Condition: This patient's medical and functional status has changed since the consult dated: 01/13/2017 in which the Rehabilitation Physician determined and documented that the patient's condition is appropriate for intensive rehabilitative care in an inpatient rehabilitation facility. See "History of Present Illness" (above) for medical update. Functional changes are: overall mod to max assist. Patient's medical and functional status update has been discussed with the Rehabilitation physician and patient remains appropriate for inpatient rehabilitation. Will admit to inpatient rehab today.  Preadmission Screen Completed By:  Cleatrice Burke, 02/04/2017 3:22 PM ______________________________________________________________________   Discussed status with Dr. Posey Pronto on 02/04/2017 at  1515 and received telephone approval for admission today.  Admission Coordinator:  Cleatrice Burke, time 4403 Date 02/04/2017       Cosigned by: Ankit Lorie Phenix, MD at 02/04/2017 3:39 PM  Revision History

## 2017-02-04 NOTE — H&P (Signed)
Physical Medicine and Rehabilitation Admission H&P    Chief Complaint  Patient presents with  . Weakness  : HPI: Joanne Morrison is a 66 y.o. right handed female with history of Chronic cystitis, hypertension, depression chronic low back pain. Per chart review, patient, and husband, patient lives with spouse. One level home for steps to entry. Patient had been independent prior to admission living with spouse who works during the day. Present 01/12/2017 with lower extremity weakness/numbness left greater than right, myalgias, left upper extremity weakness for the past 3-5 days as well as urinary retention. By report recently placed on Zithromax for strep throat. She was seen in the [place] emergency room 01/11/2017 but was discharged to home before returning with increasing weakness. MRI of the brain and entire spine negative for any significant acute structural issues. No cord compression or primary cord lesions noted. 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 Placed on IVIG 5 doses.Lumbar puncture notes CSF protein elevation without elevated WBC. Physical therapy evaluation completed 01/13/2017 with recommendations of physical medicine rehabilitation consult. On 01/15/2017 develop increasing weakness and difficulty with secretions pulmonary care medicine consulted for deterioration in respiratory status. She was intubated with ventilatory support. She again continued on IVIG per neurology. Found to have profound hyponatremia 122 placed on normal saline IV fluids and follow-up assistance provided by nephrology services for her hyponatremia. Initially a nasogastric tube was placed for nutritional support. Patient had a run of nonsustained episodes of atrial fibrillation on telemetry as well as bouts of bradycardia of 45. Cardiology service  consulted 01/22/2017 and continues to monitor with conservative care and no further workup indicated.  Echocardiogram completed with ejection fraction of 70% no wall motion abnormalities. Prolonged intubation underwent tracheostomy 01/23/2017 per Dr. Nelda Marseille as well as placement of gastrostomy tube 01/26/2017 per interventional radiology and  barium swallow 02/04/2017 with diet advanced to regular. CXR 2/13 reviewed, showing persistent basilar abnormalities Her tracheostomy was changed to a #6 cuffless Portex 02/03/2017 per Dr. Titus Mould. Dr. Ronny Bacon. 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 Ophthalmology to return tomorrow. Patient was placed on erythromycin ointment as directed. Subcutaneous heparin for DVT prophylaxis. Physical occupational therapy evaluations completed and ongoing. Patient was admitted for a comprehensive rehabilitation program.  Review of Systems  Constitutional: Negative for chills and fever.  HENT: Negative for tinnitus.   Eyes: Negative for blurred vision and double vision.  Respiratory: Positive for cough.   Cardiovascular: Positive for leg swelling. Negative for chest pain and palpitations.  Gastrointestinal: Positive for constipation. Negative for nausea and vomiting.  Genitourinary: Positive for urgency. Negative for hematuria.       Urinary retention  Musculoskeletal: Positive for back pain.  Skin: Negative for rash.  Neurological: Positive for dizziness, sensory change and weakness.  Psychiatric/Behavioral: Positive for depression.       Anxiety with history of panic attacks  All other systems reviewed and are negative.  Past Medical History:  Diagnosis Date  . Anxiety   . Arthritis    back  . Chronic cystitis   . Chronic low back pain   . Depression   . Diverticulosis of colon   . Hemorrhoids   . History of colon polyps    09-24-2009  rectal hyperplastic polyp/   and 2016 non-cancerous  . History of endometriosis   . History of  panic attacks   . Hyperlipidemia   .  Osteopenia   . Pelvic pain   . Sensation of pressure in bladder area   . Wears glasses    Past Surgical History:  Procedure Laterality Date  . BUNIONECTOMY Left 2004 approx  . CARPAL TUNNEL RELEASE Bilateral right 2014;  left 2007  . COLONOSCOPY W/ POLYPECTOMY  09/24/2009  . CYSTO WITH HYDRODISTENSION [name] 10/15/2016   Procedure: CYSTOSCOPY/HYDRODISTENSION AND MARCAINE INSTILLATION;  Surgeon: Rana Snare, MD;  Location: [facility];  Service: Urology;  Laterality: [name];  . CYSTO/  HYDRODISTENTION/  INSTILLATION THERAPY  1997 approx  . HYSTEROSCOPY W/D&C  11/01/2004   POLYPECTOMY  . IR GENERIC HISTORICAL  01/26/2017   IR GASTROSTOMY TUBE MOD SED 01/26/2017 Sandi Mariscal, MD [facility]  . LAPAROSCOPIC VAGINAL HYSTERECTOMY WITH SALPINGO OOPHORECTOMY Bilateral 04/20/2012  . TUBAL LIGATION  yrs ago   Family History  Problem Relation Age of Onset  . Hypertension Mother   . Dementia Mother   . Lung cancer Mother     smoker  . Osteoporosis Mother   . Heart attack Father 87  . Hypertension Brother   . Diabetes Maternal Aunt   . Cancer Maternal Uncle     RENAL  . Heart attack Maternal Grandmother 86  . Lung cancer Maternal Grandfather     Ecologist  . Pulmonary embolism Brother     ? etiology  . Anesthesia problems Neg Hx   . Stroke Neg Hx    Social History:  reports that she has never smoked. She has never used smokeless tobacco. She reports that she does not drink alcohol or use drugs. Allergies:  Allergies  Allergen Reactions  . Olanzapine Other (See Comments)    STIFF JOINTS, EXTREMITY WEAKNESS, MEMORY LOSS, LOSS OF BALANCE  . Sumatriptan Other (See Comments)    REACTION: altered mental status  . Graciella Freer D-S] Other (See Comments)    lethargic   Medications Prior to Admission  Medication Sig Dispense Refill  . benzonatate (TESSALON) 100 MG capsule Take 100 mg by mouth 3 (three) times daily as needed for cough.      Marland Kitchen buPROPion (WELLBUTRIN XL) 150 MG 24 hr tablet Take 150 mg by mouth every morning.    . diazepam (VALIUM) 10 MG tablet Take 10 mg by mouth every 12 (twelve) hours as needed for anxiety.    Marland Kitchen estradiol (VIVELLE-DOT) 0.1 MG/24HR patch Place 1 patch onto the skin 2 (two) times a week.    Marland Kitchen HYDROcodone-acetaminophen (NORCO/VICODIN) 5-325 MG tablet Take 1-2 tablets by mouth every 6 (six) hours as needed for moderate pain.     Marland Kitchen losartan (COZAAR) 25 MG tablet Take 25 mg by mouth daily.    . Promethazine-Phenyleph-Codeine (PROMETHAZINE VC/CODEINE) 6.25-5-10 MG/5ML SYRP Take 5 mLs by mouth daily as needed for cough.    . rizatriptan (MAXALT) 10 MG tablet Take 1 tablet (10 mg total) by mouth as needed. May repeat in 2 hours if needed 10 tablet 1  . zolpidem (AMBIEN) 10 MG tablet Take 10 mg by mouth at bedtime.    Marland Kitchen azithromycin (ZITHROMAX) 250 MG tablet Take 250 mg by mouth daily.    Marland Kitchen levofloxacin (LEVAQUIN) 500 MG tablet Take 500 mg by mouth daily.    . predniSONE (DELTASONE) 10 MG tablet Take 10-40 mg by mouth daily.      Home: Home Living Family/patient expects to be discharged to:: Inpatient rehab Living Arrangements: Spouse/significant other Available Help at Discharge: Family, Available 24 hours/day (husband can take leave to assist) Type  of Home: House Home Access: Stairs to enter [employer] of Steps: 4 Entrance Stairs-Rails: Right, Left Home Layout: One level Bathroom Shower/Tub: Optometrist:  (spouse to measure doorways for w/c accessibility) Home Equipment: Shower seat, Grab bars - tub/shower   Functional History: Prior Function Level of Independence: Independent  Functional Status:  Mobility: Bed Mobility Overal bed mobility: Needs Assistance Bed Mobility: Rolling, Sidelying to Sit, Sit to Sidelying Rolling: Mod assist Sidelying to sit: +2 for physical assistance, Mod assist Supine to sit: Total assist, +2  for physical assistance Sit to supine: +2 for physical assistance, Total assist Sit to sidelying: +2 for physical assistance, Mod assist General bed mobility comments: Assist to bring legs off bed, elevate trunk into sitting, and bring hips to EOB Transfers Overall transfer level: Needs assistance Equipment used: Ambulation equipment used Transfer via Lift Equipment: Stedy Transfers: Sit to/from Stand Sit to Stand: +2 physical assistance, Mod assist Stand pivot transfers: Total assist, +2 physical assistance Squat pivot transfers: Max assist  Lateral/Scoot Transfers: +2 physical assistance, Total assist General transfer comment: Assist to bring hips and trunk up using pad under hips. Assist to bring trunk out of flexion and to stand more erect. Facilitation to extend hips Ambulation/Gait General Gait Details: unable    ADL: ADL Overall ADL's : Needs assistance/impaired Eating/Feeding: Sitting, Minimal assistance Grooming: Moderate assistance, Sitting Grooming Details (indicate cue type and reason): Mod assist to raise L UE to head to complete hair brushing task. Mod assist for balance seated EOB. Upper Body Bathing: Maximal assistance, Sitting Lower Body Bathing: Total assistance, Bed level Upper Body Dressing : Maximal assistance, Sitting Lower Body Dressing: Total assistance, Bed level Toilet Transfer: Total assistance, +2 for physical assistance, Requires drop arm Toilet Transfer Details (indicate cue type and reason): simulated to lateral transfer to drop arm recliner Toileting- Clothing Manipulation and Hygiene: Maximal assistance, Bed level General ADL Comments: Total A at this time  Cognition: Cognition Overall Cognitive Status: Difficult to assess Orientation Level: Oriented X4 Cognition Arousal/Alertness: Awake/alert Behavior During Therapy: WFL for tasks assessed/performed Overall Cognitive Status: Difficult to assess General Comments: following 1 step commands  consistently  Difficult to assess due to: Tracheostomy  Physical Exam: Blood pressure 133/84, pulse 99, temperature 98 F (36.7 C), temperature source Oral, resp. rate 18, height 5' 2"  (1.575 m), weight 66.8 kg (147 lb 4.3 oz), SpO2 100 %. Physical Exam  Vitals reviewed. Constitutional: She is oriented to person, place, and time. She appears well-developed and well-nourished.  HENT:  Head: Normocephalic.  Eyes:  Left eye injected with redness with sutures, but open Right pupil round reactive to light  Neck:  Tracheostomy tube in place  Cardiovascular: Normal rate and regular rhythm.   Respiratory: Breath sounds normal.  Fair inspiratory effort.  GI: Soft. Bowel sounds are normal. She exhibits no distension. There is no tenderness.  PEG tube in place  Musculoskeletal: She exhibits no edema or tenderness.  Neurological: She is alert and oriented to person, place, and time.  Motor:  LUE: 4/5 proximally, 4-/5 distally LLE: 3-/5 proximally, 4/5 distally RUE: 4/5 proximal to distal RLE: 3+/5 proximally, 4/5 distally Sensation diminished to light touch bilateral feet, L>R DTRs absent b/l LE  Skin: Skin is warm and dry.  Psychiatric: She has a normal mood and affect. Her behavior is normal.    Results for orders placed or performed during the hospital encounter of 01/12/17 (from the past 48 hour(s))  Glucose, capillary  Status: Abnormal   Collection Time: 02/01/17 12:02 PM  Result Value Ref Range   Glucose-Capillary 100 (H) 65 - 99 mg/dL  Glucose, capillary     Status: Abnormal   Collection Time: 02/01/17  5:15 PM  Result Value Ref Range   Glucose-Capillary 124 (H) 65 - 99 mg/dL  Glucose, capillary     Status: Abnormal   Collection Time: 02/01/17  8:23 PM  Result Value Ref Range   Glucose-Capillary 114 (H) 65 - 99 mg/dL  Glucose, capillary     Status: Abnormal   Collection Time: 02/01/17 11:55 PM  Result Value Ref Range   Glucose-Capillary 118 (H) 65 - 99 mg/dL    Comprehensive metabolic panel     Status: Abnormal   Collection Time: 02/02/17  2:18 AM  Result Value Ref Range   Sodium 139 135 - 145 mmol/L   Potassium 4.2 3.5 - 5.1 mmol/L    Comment: DELTA CHECK NOTED   Chloride 104 101 - 111 mmol/L   CO2 28 22 - 32 mmol/L   Glucose, Bld 128 (H) 65 - 99 mg/dL   BUN 21 (H) 6 - 20 mg/dL   Creatinine, Ser 0.51 0.44 - 1.00 mg/dL   Calcium 9.5 8.9 - 10.3 mg/dL   Total Protein 7.1 6.5 - 8.1 g/dL   Albumin 2.4 (L) 3.5 - 5.0 g/dL   AST 46 (H) 15 - 41 U/L   ALT 74 (H) 14 - 54 U/L   Alkaline Phosphatase 86 38 - 126 U/L   Total Bilirubin 0.6 0.3 - 1.2 mg/dL   GFR calc non Af Amer >60 >60 mL/min   GFR calc Af Amer >60 >60 mL/min    Comment: (NOTE) The eGFR has been calculated using the CKD EPI equation. This calculation has not been validated in all clinical situations. eGFR's persistently <60 mL/min signify possible Chronic Kidney Disease.    Anion gap 7 5 - 15  Glucose, capillary     Status: Abnormal   Collection Time: 02/02/17  4:46 AM  Result Value Ref Range   Glucose-Capillary 128 (H) 65 - 99 mg/dL  Glucose, capillary     Status: Abnormal   Collection Time: 02/02/17  8:03 AM  Result Value Ref Range   Glucose-Capillary 114 (H) 65 - 99 mg/dL  Glucose, capillary     Status: Abnormal   Collection Time: 02/02/17 11:23 AM  Result Value Ref Range   Glucose-Capillary 125 (H) 65 - 99 mg/dL  Glucose, capillary     Status: Abnormal   Collection Time: 02/02/17  4:11 PM  Result Value Ref Range   Glucose-Capillary 144 (H) 65 - 99 mg/dL  Glucose, capillary     Status: Abnormal   Collection Time: 02/02/17  8:34 PM  Result Value Ref Range   Glucose-Capillary 125 (H) 65 - 99 mg/dL  Glucose, capillary     Status: Abnormal   Collection Time: 02/03/17  1:01 AM  Result Value Ref Range   Glucose-Capillary 116 (H) 65 - 99 mg/dL  Glucose, capillary     Status: Abnormal   Collection Time: 02/03/17  4:52 AM  Result Value Ref Range   Glucose-Capillary 151  (H) 65 - 99 mg/dL  Glucose, capillary     Status: Abnormal   Collection Time: 02/03/17  8:08 AM  Result Value Ref Range   Glucose-Capillary 156 (H) 65 - 99 mg/dL   No results found.     Medical Problem List and Plan: 1.  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 IVIG course completed. No current plans for plasma exchange 2.  DVT Prophylaxis/Anticoagulation: Subcutaneous heparin. Check vascular study 3. Pain Management: Lidoderm patch, Duragesic patch 4. Mood: Zoloft 50 mg daily, Ativan as needed 5. Neuropsych: This patient is capable of making decisions on her own behalf. 6. Skin/Wound Care: Routine skin checks 7. Fluids/Electrolytes/Nutrition: Routine I&O with follow-up chemistries 8. Tracheostomy 01/23/2017. Change to #6 Cuffless Portex 02/03/2017 per Dr. Titus Mould 9. Gastrostomy tube 01/26/2017 per interventional radiology. Diet advanced to regular 02/04/2017 after modified barium swallow Follow-up speech therapy 10. Atrial fibrillation/bradycardia. Cardiac rate control. Follow-up with cardiology services 11. 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 Underwent tarorhaphy 02/04/2017. Continue erythromycin ointment for now follow-up ophthalmology services as needed 12. Hyponatremia. Resolved. All chemistries. 13. Abnormal CXR: Clinically improving, will consider further imaging if necessary.   Post Admission Physician Evaluation: 1. Preadmission assessment reviewed and changes made below. 2. Functional deficits secondary  to GBS. 3. Patient is admitted to receive collaborative, interdisciplinary care between the physiatrist, rehab nursing staff, and therapy team. 4. Patient's level of medical complexity and substantial therapy needs in context of that medical necessity cannot be provided at a lesser intensity of care such as a SNF. 5. Patient has experienced substantial functional loss from his/her baseline which was documented above under the "Functional  History" and "Functional Status" headings.  Judging by the patient's diagnosis, physical exam, and functional history, the patient has potential for functional progress which will result in measurable gains while on inpatient rehab.  These gains will be of substantial and practical use upon discharge  in facilitating mobility and self-care at the household level. 6. Physiatrist will provide 24 hour management of medical needs as well as oversight of the therapy plan/treatment and provide guidance as appropriate regarding the interaction of the two. 7. The Preadmission Screening has been reviewed and patient status is unchanged unless otherwise stated above. 8. 24 hour rehab nursing will assist with bowel management, safety, skin/wound care, disease management and patient education  and help integrate therapy concepts, techniques,education, etc. 9. PT will assess and treat for/with: Lower extremity strength, range of motion, stamina, balance, functional mobility, safety, adaptive techniques and equipment,  coping skills, pain control, education.   Goals are: Mod I at wheelchair level. 10. OT will assess and treat for/with: ADL's, functional mobility, safety, upper extremity strength, adaptive techniques and equipment, ego support, and community reintegration.   Goals are: Min A/Supervision. Therapy may proceed with showering this patient. 11. SLP will assess and treat for/with: swallowing.  Goals are: Ind. 12. Case Management and Social Worker will assess and treat for psychological issues and discharge planning. 57. Team conference will be held weekly to assess progress toward goals and to determine barriers to discharge. 14. Patient will receive at least 3 hours of therapy per day at least 5 days per week. 15. ELOS: 15-19 days.       16. Prognosis:  good  Delice Lesch, MD, Mellody Drown Cathlyn Parsons., PA-C 02/03/2017

## 2017-02-04 NOTE — PMR Pre-admission (Signed)
PMR Admission Coordinator Pre-Admission Assessment Patient: Joanne Morrison is an 66 y.o., female MRN: [id number] DOB: 09/13/1951 Height: 5' 2" (157.5 cm) Weight: 68.1 kg (150 lb 2.1 oz)              Insurance Information HMO:     PPO: yes     PCP:      IPA:      80/20:      OTHER:  PRIMARY: [employer] [place]      Policy#: [id number]      Subscriber: spouse CM Name: Maudie Mercury      Phone#: [phone] ext [phone]     Fax#: [phone] Pre-Cert#: [id number] approved 21/21 thru 2/18 when updates are due 2/27      Employer:  Benefits:  Phone #: [phone]     Name: 02/03/17 Eff. Date: 12/16/11     Deduct: $500      Out of Pocket Max: $3000      Life Max: none CIR: 80%      SNF: 80% 120 days Outpatient: $50 co pay per visit     Co-Pay: 120 visits combined Home Health: 80%      Co-Pay: 20% 120 days DME: 80%     Co-Pay: [phone] Providers: in network  SECONDARY: Medicare part A only      Policy#: [id number]      Subscriber: pt  Medicaid Application Date:       Case Manager:  Disability Application Date:       Case Worker:   Emergency Contact Information Contact Information    Name Relation Home Work [place] Spouse [phone]    No name specified         Current Medical History  Patient Admitting Diagnosis: Quadriparesis secondary to guillain-Barre syndrome  History of Present Illness:    : HPI: Joanne Pittsis a 66 y.o.right handed femalewith history of Chronic cystitis, hypertension, depression chronic low back pain.  Present 01/12/2017 with lower extremity weakness/numbness left greater than right, myalgias, left upper extremity weakness for the past 3-5 days as well as urinary retention. By report recently placed on Zithromax for strep throat. She was seen in the [facility] emergency room 01/11/2017 but was discharged to home before returning with increasing weakness. MRI of the brain and entire spine negative for any significant acute structural issues. No  cord compression or primary cord lesions noted. 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 Placed on IVIG 5 doses.Lumbar puncture notes CSF protein elevation without elevated WBC. Physical therapy evaluation completed 01/13/2017 with recommendations of physical medicine rehabilitation consult. On 01/15/2017 develop increasing weakness and difficulty with secretions pulmonary care medicine consulted for deterioration in respiratory status. She was intubated with ventilatory support. She again continued on IVIG per neurology. Found to have profound hyponatremia 122 placed on normal saline IV fluids and follow-up assistance provided by nephrology services for her hyponatremia.. Initially a nasogastric tube was placed for nutritional support. Patient had a run of nonsustained episodes of atrial fibrillation on telemetry as well as bouts of bradycardia of 45. Cardiology service  consulted 01/22/2017 and continues to monitor with conservative care and no further workup indicated. Echocardiogram completed with ejection fraction of 70% no wall motion abnormalities. Prolonged intubation underwent tracheostomy 01/23/2017 per Dr. Nelda Marseille as well as placement of gastrostomy tube 01/26/2017 per interventional radiology and  barium swallow 02/04/2017 with diet advanced to regular. Her tracheostomy was changed to a #6 cuff less Portex 02/03/2017  per Dr. Titus Mould. Dr. Ronny Bacon. 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 Patient was placed on erythromycin ointment as directed. Subcutaneous heparin for DVT prophylaxis.    Past Medical History  Past Medical History:  Diagnosis Date  . Anxiety   . Arthritis    back  . Chronic cystitis   . Chronic low back pain   . Depression   . Diverticulosis of colon   . Hemorrhoids   . History of colon polyps    09-24-2009  rectal  hyperplastic polyp/   and 2016 non-cancerous  . History of endometriosis   . History of panic attacks   . Hyperlipidemia   . Osteopenia   . Pelvic pain   . Sensation of pressure in bladder area   . Wears glasses     Family History  family history includes Cancer in her maternal uncle; Dementia in her mother; Diabetes in her maternal aunt; Heart attack (age of onset: 16) in her father; Heart attack (age of onset: 52) in her maternal grandmother; Hypertension in her brother and mother; Lung cancer in her maternal grandfather and mother; Osteoporosis in her mother; Pulmonary embolism in her brother.  Prior Rehab/Hospitalizations:  Has the patient had major surgery during 100 days prior to admission? No  Current Medications   Current Facility-Administered Medications:  .  acetaminophen (TYLENOL) solution 650 mg, 650 mg, Per Tube, Q6H PRN, Cherene Altes, MD, 650 mg at 02/03/17 1530 .  albuterol (PROVENTIL) (2.5 MG/3ML) 0.083% nebulizer solution 2.5 mg, 2.5 mg, Nebulization, Q2H PRN, Jonetta Osgood, MD .  chlorhexidine gluconate (MEDLINE KIT) (PERIDEX) 0.12 % solution 15 mL, 15 mL, Mouth Rinse, BID, Erick Colace, NP, 15 mL at 02/03/17 1938 .  docusate (COLACE) 50 MG/5ML liquid 100 mg, 100 mg, Per Tube, BID PRN, Cherene Altes, MD .  erythromycin ophthalmic ointment 1 application, 1 application, Both Eyes, Q4H, Luberta Mutter, MD, 1 application at 65/79/03 1206 .  feeding supplement (JEVITY 1.2 CAL) liquid 1,000 mL, 1,000 mL, Per Tube, Continuous, Cherene Altes, MD, Last Rate: 45 mL/hr at 02/04/17 0532, 1,000 mL at 02/04/17 0532 .  feeding supplement (PRO-STAT SUGAR FREE 64) liquid 30 mL, 30 mL, Per Tube, BID, Marijean Heath, NP, 30 mL at 02/04/17 0858 .  fentaNYL (DURAGESIC - dosed mcg/hr) 50 mcg, 50 mcg, Transdermal, Q72H, Raylene Miyamoto, MD, 50 mcg at 02/03/17 0911 .  fentaNYL (SUBLIMAZE) injection 50 mcg, 50 mcg, Intravenous, Q2H PRN, Cherene Altes, MD, 50 mcg  at 02/01/17 1940 .  free water 100 mL, 100 mL, Per Tube, Q8H, Cherene Altes, MD, 100 mL at 02/04/17 0600 .  heparin injection 5,000 Units, 5,000 Units, Subcutaneous, Q8H, Monia Sabal, PA-C, 5,000 Units at 02/04/17 0532 .  hydrALAZINE (APRESOLINE) injection 10 mg, 10 mg, Intravenous, Q4H PRN, Erick Colace, NP, 10 mg at 01/25/17 1008 .  insulin aspart (novoLOG) injection 0-15 Units, 0-15 Units, Subcutaneous, Q4H, Erick Colace, NP, 3 Units at 02/04/17 1212 .  lidocaine (LIDODERM) 5 % 1 patch, 1 patch, Transdermal, Q24H, Jonetta Osgood, MD, 1 patch at 02/03/17 1700 .  LORazepam (ATIVAN) injection 0.5 mg, 0.5 mg, Intravenous, Q6H PRN, Cherene Altes, MD, 0.5 mg at 02/03/17 1530 .  LORazepam (ATIVAN) tablet 1 mg, 1 mg, Per Tube, Q12H, Raylene Miyamoto, MD, 1 mg at 02/04/17 1000 .  MEDLINE mouth rinse, 15 mL, Mouth Rinse, 10 times per day,  Erick Colace, NP, 15 mL at 02/04/17 1000 .  ondansetron (ZOFRAN) tablet 4 mg, 4 mg, Per Tube, Q6H PRN **OR** ondansetron (ZOFRAN) injection 4 mg, 4 mg, Intravenous, Q6H PRN, Cherene Altes, MD .  pantoprazole sodium (PROTONIX) 40 mg/20 mL oral suspension 40 mg, 40 mg, Per Tube, Q1200, Erick Colace, NP, 40 mg at 02/04/17 1212 .  sertraline (ZOLOFT) tablet 50 mg, 50 mg, Per Tube, QHS, Cherene Altes, MD, 50 mg at 02/03/17 2213  Patients Current Diet: Diet regular Room service appropriate? Yes; Fluid consistency: Thin  Precautions / Restrictions Precautions Precautions: Fall Precaution Comments: trach Other Brace/Splint: B PRAFO  Restrictions Weight Bearing Restrictions: No   Has the patient had 2 or more falls or a fall with injury in the past year?No  Prior Activity Level Community (5-7x/wk): Pt independent and driving pta. No AD. Retired 5 years ago. chronic back isssues  Development worker, international aid / Equipment Home Assistive Devices/Equipment: None Home Equipment: Shower seat, Grab bars - tub/shower  Prior Device Use: Indicate  devices/aids used by the patient prior to current illness, exacerbation or injury? None of the above  Prior Functional Level Prior Function Level of Independence: Independent  Self Care: Did the patient need help bathing, dressing, using the toilet or eating?  Independent  Indoor Mobility: Did the patient need assistance with walking from room to room (with or without device)? Independent  Stairs: Did the patient need assistance with internal or external stairs (with or without device)? Independent  Functional Cognition: Did the patient need help planning regular tasks such as shopping or remembering to take medications? Independent  Current Functional Level Cognition  Overall Cognitive Status: Within Functional Limits for tasks assessed Difficult to assess due to: Tracheostomy Orientation Level: Oriented X4 General Comments: patient with significantly improved cognition this session, verbalizing throughout session and very interactive    Extremity Assessment (includes Sensation/Coordination)  Upper Extremity Assessment: RUE deficits/detail, LUE deficits/detail RUE Deficits / Details: Reports sensation in tact. Grasp strength 3+/5. AAROM WFL, strength shoulder flexion 2+/5, elbow flex 4-/5, ext 3+/5 RUE Sensation: decreased proprioception RUE Coordination: decreased fine motor LUE Deficits / Details: Reports sensation in tact; grasp strength 3+/5AAROM WFL, strength shoulder flex 2-/5, elbow flex 4-/5, ext 2+/5, grip weaker than R LUE Coordination: decreased fine motor  Lower Extremity Assessment: Defer to PT evaluation RLE Deficits / Details: AAROM WFL, strength hip flexion 3-/5, knee extension 4/5, ankle DF 3+/5 RLE Sensation: decreased light touch LLE Deficits / Details: AAROM WFL, strength hip flexion 2+/5, knee extension 4-/5, ankle DF 3-/5 LLE Sensation: decreased light touch    ADLs  Overall ADL's : Needs assistance/impaired Eating/Feeding: Sitting, Minimal  assistance Grooming: Wash/dry face, Minimal assistance, Sitting Grooming Details (indicate cue type and reason): guiding, hand over hand with R UE Upper Body Bathing: Maximal assistance, Sitting Lower Body Bathing: Total assistance, Bed level Upper Body Dressing : Maximal assistance, Sitting Lower Body Dressing: Total assistance, Bed level Toilet Transfer: +2 for physical assistance, Stand-pivot Toilet Transfer Details (indicate cue type and reason): simulated to recliner Toileting- Clothing Manipulation and Hygiene: Total assistance General ADL Comments: Total A at this time    Mobility  Overal bed mobility: Needs Assistance Bed Mobility: Rolling, Sidelying to Sit Rolling: Min assist Sidelying to sit: Min assist, +2 for physical assistance Supine to sit: Total assist, +2 for physical assistance Sit to supine: +2 for physical assistance, Total assist Sit to sidelying: +2 for physical assistance, Mod assist General bed mobility comments: assist  for LLE movement and trunk stability in elevating to upright    Transfers  Overall transfer level: Needs assistance Equipment used: Rolling walker (2 wheeled), 2 person hand held assist Transfer via Lift Equipment: Stedy Transfers: Sit to/from Stand Sit to Stand: +2 physical assistance, Mod assist Stand pivot transfers: +2 physical assistance, Mod assist Squat pivot transfers: Max assist  Lateral/Scoot Transfers: +2 physical assistance, Total assist General transfer comment: Assist to power up to standing, some intial instability requiring faciliatation of hip extension, cues for quad setting and bilateral LE knee blocking for stability. Patient was able to initate steps to chair with good clearance of RLE and some faciliatation of LLE.    Ambulation / Gait / Stairs / Wheelchair Mobility  Ambulation/Gait General Gait Details: unable    Posture / Balance Dynamic Sitting Balance Sitting balance - Comments: toelrated >15 mins EOB with  activities  Balance Overall balance assessment: Needs assistance Sitting-balance support: Feet supported, Bilateral upper extremity supported Sitting balance-Leahy Scale: Poor Sitting balance - Comments: toelrated >15 mins EOB with activities  Postural control: Posterior lean Standing balance support: Bilateral upper extremity supported Standing balance-Leahy Scale: Poor Standing balance comment: Pt stood x 3 with increased time each attempt and able to perform pre gait activities with weight shifts during standing    Special needs/care consideration BiPAP/CPAP  [name] CPM  [name] Continuous Drip IV  [name] Dialysis  [name] Life Vest  [name] Oxygen 28 % trach collar Special Bed  Overlay mattress Trach Size #6 cuff less portex trach 28 % trach collar. Using PMSV Wound Vac (area)  [name] Skin abrasions bilateral knees, ecchymosis Bilateral Upper extremities,Gtube site                        Bowel mgmt:LBM 2/19 continent bladder mgmt:continent Diabetic mgmt  [name] Left eye stitched closed 01/04/2017   Previous Home Environment Living Arrangements: Spouse/significant other  Lives With: Spouse Available Help at Discharge:  (spouse works 130 pm until 11 am. pt's sister may help or h) Type of Home: House Home Layout: One level Home Access: Stairs to enter Entrance Stairs-Rails: Right, Left Entrance Stairs-Number of Steps: 4 Bathroom Shower/Tub: Tub/shower unit, Architectural technologist: Standard Bathroom Accessibility: Yes How Accessible: Accessible via walker [place]: No  Discharge Living Setting Plans for Discharge Living Setting: Patient's home, Lives with (comment) (spouse) Type of Home at Discharge: House Discharge Home Layout: One level Discharge Home Access: Stairs to enter Entrance Stairs-Rails: Right, Left, Can reach both Entrance Stairs-Number of Steps: 4 Discharge Bathroom Shower/Tub: Tub/shower unit, Curtain Discharge Bathroom Toilet: Standard Discharge Bathroom  Accessibility: Yes How Accessible: Accessible via walker Does the patient have any problems obtaining your medications?: No  Social/Family/Support Systems Patient Roles: Spouse, Parent (pt has a son) Contact Information: spouse Anticipated Caregiver: spouse may take FMLA, sister ,ay assist or hired help Anticipated Ambulance person Information: see above Ability/Limitations of Caregiver: spouse works 130 pm until 62 am. Pt's sister not close to her until pt's admission. May have to hire help Caregiver Availability: 24/7 Discharge Plan Discussed with Primary Caregiver: Yes Is Caregiver In Agreement with Plan?: Yes Does Caregiver/Family have Issues with Lodging/Transportation while Pt is in Rehab?: No  Goals/Additional Needs Patient/Family Goal for Rehab: supervision to min assist with PT, OT, and SLP Expected length of stay: [facility] 20-23 days Equipment Needs: left eye stiched closed today 02/04/17 Pt/Family Agrees to Admission and willing to participate: Yes Program Orientation Provided & Reviewed with Pt/Caregiver Including  Roles  & Responsibilities: Yes  Decrease burden of Care through IP rehab admission: n/a  Possible need for SNF placement upon discharge: I spoke with pt's spouse on 02/04/2017 to discuss that [facility] will not approve an inpt rehab admission and then SNF rehab afterwards typically. Pt would need 24/7 care after discharge form spouse, other family members or hired help. Pt and her sister live 5 mins apart, but have been estranged until this admission. Spouse states she may be of some help, he can take some FMLA, or hire help.  Patient Condition: This patient's medical and functional status has changed since the consult dated: 01/13/2017 in which the Rehabilitation Physician determined and documented that the patient's condition is appropriate for intensive rehabilitative care in an inpatient rehabilitation facility. See "History of Present Illness" (above) for medical update.  Functional changes are: overall mod to max assist. Patient's medical and functional status update has been discussed with the Rehabilitation physician and patient remains appropriate for inpatient rehabilitation. Will admit to inpatient rehab today.  Preadmission Screen Completed By:  Cleatrice Burke, 02/04/2017 3:22 PM ______________________________________________________________________   Discussed status with Dr. Posey Pronto on 02/04/2017 at  1515 and received telephone approval for admission today.  Admission Coordinator:  Cleatrice Burke, time 2774 Date 02/04/2017

## 2017-02-05 ENCOUNTER — Inpatient Hospital Stay (HOSPITAL_COMMUNITY): Payer: BLUE CROSS/BLUE SHIELD | Admitting: Physical Therapy

## 2017-02-05 ENCOUNTER — Inpatient Hospital Stay (HOSPITAL_COMMUNITY): Payer: BLUE CROSS/BLUE SHIELD | Admitting: Speech Pathology

## 2017-02-05 ENCOUNTER — Inpatient Hospital Stay (HOSPITAL_COMMUNITY): Payer: BLUE CROSS/BLUE SHIELD

## 2017-02-05 ENCOUNTER — Inpatient Hospital Stay (HOSPITAL_COMMUNITY): Payer: BLUE CROSS/BLUE SHIELD | Admitting: Occupational Therapy

## 2017-02-05 DIAGNOSIS — M79609 Pain in unspecified limb: Secondary | ICD-10-CM

## 2017-02-05 DIAGNOSIS — H16213 Exposure keratoconjunctivitis, bilateral: Secondary | ICD-10-CM

## 2017-02-05 DIAGNOSIS — G610 Guillain-Barre syndrome: Principal | ICD-10-CM

## 2017-02-05 LAB — COMPREHENSIVE METABOLIC PANEL
ALK PHOS: 78 U/L (ref 38–126)
ALT: 77 U/L — AB (ref 14–54)
ANION GAP: 10 (ref 5–15)
AST: 47 U/L — ABNORMAL HIGH (ref 15–41)
Albumin: 2.6 g/dL — ABNORMAL LOW (ref 3.5–5.0)
BUN: 17 mg/dL (ref 6–20)
CALCIUM: 9.7 mg/dL (ref 8.9–10.3)
CO2: 28 mmol/L (ref 22–32)
CREATININE: 0.68 mg/dL (ref 0.44–1.00)
Chloride: 98 mmol/L — ABNORMAL LOW (ref 101–111)
GFR calc non Af Amer: >60 mL/min (ref >60)
Glucose, Bld: 111 mg/dL — ABNORMAL HIGH (ref 65–99)
Potassium: 3.8 mmol/L (ref 3.5–5.1)
SODIUM: 136 mmol/L (ref 135–145)
Total Bilirubin: 0.6 mg/dL (ref 0.3–1.2)
Total Protein: 7.1 g/dL (ref 6.5–8.1)

## 2017-02-05 LAB — CBC WITH DIFFERENTIAL/PLATELET
BASOS PCT: 1 %
Basophils Absolute: 0.1 10*3/uL (ref 0.0–0.1)
EOS ABS: 0.4 10*3/uL (ref 0.0–0.7)
EOS PCT: 4 %
HCT: 35.4 % — ABNORMAL LOW (ref 36.0–46.0)
HEMOGLOBIN: 11.2 g/dL — AB (ref 12.0–15.0)
Lymphocytes Relative: 24 %
Lymphs Abs: 2.2 10*3/uL (ref 0.7–4.0)
MCH: 27 pg (ref 26.0–34.0)
MCHC: 31.6 g/dL (ref 30.0–36.0)
MCV: 85.3 fL (ref 78.0–100.0)
Monocytes Absolute: 0.9 10*3/uL (ref 0.1–1.0)
Monocytes Relative: 9 %
NEUTROS PCT: 62 %
Neutro Abs: 5.9 10*3/uL (ref 1.7–7.7)
Platelets: 405 10*3/uL — ABNORMAL HIGH (ref 150–400)
RBC: 4.15 MIL/uL (ref 3.87–5.11)
RDW: 17.8 % — ABNORMAL HIGH (ref 11.5–15.5)
WBC: 9.4 10*3/uL (ref 4.0–10.5)

## 2017-02-05 MED ORDER — ENSURE ENLIVE PO LIQD
237.0000 mL | Freq: Three times a day (TID) | ORAL | Status: DC
  Administered 2017-02-05 – 2017-02-10 (×12): 237 mL via ORAL

## 2017-02-05 NOTE — Evaluation (Signed)
Physical Therapy Assessment and Plan  Patient Details  Name: Joanne Morrison MRN: [id number] Date of Birth: [birth year]  PT Diagnosis: Abnormal posture, Abnormality of gait, Ataxia, Coordination disorder, Difficulty walking, Impaired sensation, Muscle weakness and Quadriplegia Rehab Potential: Good ELOS: 21-24 days   Today's Date: 02/05/2017 PT Individual Time: 1430-1530 PT Individual Time Calculation (min): 60 min    Problem List:  Patient Active Problem List   Diagnosis Date Noted  . Numbness and tingling   . S/P percutaneous endoscopic gastrostomy (PEG) tube placement ([place])   . Bradycardia   . Left corneal abrasion   . Abnormal chest x-ray   . Acute respiratory failure with hypoxia ([place])   . Guillain Barr syndrome ([place])   . Atrial fibrillation with RVR ([place])   . Dysphagia, pharyngoesophageal phase   . History of ETT   . Encounter for central line placement   . Encounter for feeding tube placement   . Weakness of right lower extremity   . SOB (shortness of breath)   . Pneumonia   . Pressure injury of skin 01/16/2017  . Acute respiratory failure ([place])   . Hyponatremia with decreased serum osmolality   . Hyponatremia   . Quadriplegia and quadriparesis ([place]) 01/14/2017  . GBS (Guillain Barre syndrome) ([place]) 01/13/2017  . Weakness 01/12/2017  . Hypokalemia 01/12/2017  . Endometriosis 12/05/2013  . Chronic low back pain 08/19/2012  . HYPERLIPIDEMIA 12/10/2010  . COLONIC POLYPS, HX OF 12/13/2009  . HYPERGLYCEMIA, FASTING 11/22/2008  . Depression 02/07/2008  . ANOSMIA 02/07/2008  . HEADACHE, CHRONIC 02/07/2008  . MITRAL VALVE PROLAPSE 10/05/2007  . OSTEOPENIA 10/05/2007    Past Medical History:  Past Medical History:  Diagnosis Date  . Anxiety   . Arthritis    back  . Chronic cystitis   . Chronic low back pain   . Depression   . Diverticulosis of colon   . Hemorrhoids   . History of colon polyps    09-24-2009  rectal hyperplastic polyp/   and 2016  non-cancerous  . History of endometriosis   . History of panic attacks   . Hyperlipidemia   . Osteopenia   . Pelvic pain   . Sensation of pressure in bladder area   . Wears glasses    Past Surgical History:  Past Surgical History:  Procedure Laterality Date  . BUNIONECTOMY Left 2004 approx  . CARPAL TUNNEL RELEASE Bilateral right 2014;  left 2007  . COLONOSCOPY W/ POLYPECTOMY  09/24/2009  . CYSTO WITH HYDRODISTENSION [name] 10/15/2016   Procedure: CYSTOSCOPY/HYDRODISTENSION AND MARCAINE INSTILLATION;  Surgeon: Rana Snare, MD;  Location: [facility];  Service: Urology;  Laterality: [name];  . CYSTO/  HYDRODISTENTION/  INSTILLATION THERAPY  1997 approx  . HYSTEROSCOPY W/D&C  11/01/2004   POLYPECTOMY  . IR GENERIC HISTORICAL  01/26/2017   IR GASTROSTOMY TUBE MOD SED 01/26/2017 Sandi Mariscal, MD [facility]-INTER[facility]  . LAPAROSCOPIC VAGINAL HYSTERECTOMY WITH SALPINGO OOPHORECTOMY Bilateral 04/20/2012  . TUBAL LIGATION  yrs ago    Assessment & Plan Clinical Impression: Joanne Morrison a 66 y.o.right handed femalewith history of Chroniccystitis,hypertension, depression chronic low back pain. Present 01/12/2017 with lower extremity weakness/numbness left greater than right, myalgias, left upper extremity weakness for the past 3-5 days as well as urinary retention. By report recently placed on Zithromax for strep throat. She was seen in the [place] emergency room 01/11/2017 but was discharged to home before returning with increasing weakness. MRI of the brain and entire spine negative for any significant  acute structural issues. No cord compression or primary cord lesions noted. 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 Placed on IVIG 5 doses.Lumbar puncture notes CSF protein elevation without elevated WBC.Physical therapy evaluation completed 01/13/2017 with recommendations of physical medicine rehabilitation consult.On 01/15/2017  develop increasing weakness and difficulty with secretions pulmonary care medicine consulted for deterioration in respiratory status. She was intubated with ventilatory support. She again continued on IVIG per neurology. Found to have profound hyponatremia 122 placed on normal saline IV fluids and follow-up assistance provided by nephrology services for her hyponatremia.. Initially anasogastric tube wasplaced for nutritional support. Patient had a run of nonsustained episodes of atrial fibrillation on telemetry as well as bouts of bradycardia of 45. Cardiology service consulted 2/8/2018and continues to monitor with conservative care and no further workup indicated. Echocardiogram completed with ejection fraction of 70% no wall motion abnormalities. Prolonged intubation underwent tracheostomy 01/23/2017 per Dr. Nelda Marseille as well as placement of gastrostomy tube 01/26/2017 per interventional radiologyand barium swallow 02/21/2018with diet advanced to regular. Her tracheostomy was changed to a #6 cuff lessPortex02/20/2018 per Dr. Titus Mould.Dr. Ronny Bacon. 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 Subcutaneous heparin for DVT prophylaxis  Patient transferred to CIR on 02/04/2017 .   Patient currently requires max with mobility secondary to muscle weakness, decreased cardiorespiratoy endurance, impaired timing and sequencing, abnormal tone, unbalanced muscle activation and decreased coordination and decreased sitting balance, decreased standing balance, decreased postural control and decreased balance strategies.  Prior to hospitalization, patient was independent  with mobility and lived with Spouse in a House home.  Home access is 4Stairs to enter.  Patient will benefit from skilled PT intervention to maximize safe functional mobility, minimize  fall risk and decrease caregiver burden for planned discharge home with 24 hour supervision.  Anticipate patient will benefit from follow up [place] at discharge.  PT - End of Session Activity Tolerance: Tolerates 30+ min activity with multiple rests Endurance Deficit: Yes Endurance Deficit Description: fatigues quickly with short duration mobility tasks PT Assessment Rehab Potential (ACUTE/IP ONLY): Good Barriers to Discharge: Inaccessible home environment;Decreased caregiver support (3 STE; can build ramp if necessary, husband on leave from work but plans to return to work eventually) PT Patient demonstrates impairments in the following area(s): Balance;Pain;Safety;Sensory;Endurance;Motor;Skin Integrity PT Transfers Functional Problem(s): Bed Mobility;Car;Bed to Chair;Furniture PT Locomotion Functional Problem(s): Ambulation;Wheelchair Mobility;Stairs PT Plan PT Intensity: Minimum of 1-2 x/day ,45 to 90 minutes PT Frequency: 5 out of 7 days PT Duration Estimated Length of Stay: 21-24 days PT Treatment/Interventions: Ambulation/gait training;Balance/vestibular training;Discharge planning;Community reintegration;Cognitive remediation/compensation;Disease management/prevention;DME/adaptive equipment instruction;Functional mobility training;Neuromuscular re-education;Patient/family education;Pain management;Psychosocial support;Skin care/wound management;Stair training;Splinting/orthotics;Therapeutic Activities;Therapeutic Exercise;UE/LE Coordination activities;UE/LE Strength taining/ROM;Wheelchair propulsion/positioning PT Transfers Anticipated Outcome(s): S with LRAD PT Locomotion Anticipated Outcome(s): S gait, minA stairs PT Recommendation Recommendations for Other Services: Therapeutic Recreation consult Therapeutic Recreation Interventions: Pet therapy;Kitchen group;Outing/community reintergration;Stress management Follow Up Recommendations: Home health PT Patient destination: Home Equipment  Recommended: To be determined  Skilled Therapeutic Intervention Pt received supine in bed, c/o pain/muscle soreness in abdomen improved with activity, and agreeable to treatment. Performed PT initial evaluation as described below with modA for lateral scoot transfers with transfer board, maxA > +2A for sit <>stand from EOB to pull up pants, mod/maxA sit <>stand in parallel bars with min guard for static standing balance. Educated pt and husband in rehab process, goals, estimated LOS; pt and husband agreeable to all the  above. Vitals remained WNL throughout session; O2 97% on room air HR 117 bpm after mobility activities. Remained seated in w/c at end of session, attached to humidified air at end of session, husband present and all needs in reach.   PT Evaluation Precautions/Restrictions Precautions Precautions: Fall Precaution Comments: trach General Chart Reviewed: Yes Response to Previous Treatment: Patient reporting fatigue but able to participate. Family/Caregiver Present: Yes  Vital SignsTherapy Vitals Temp: 98.1 F (36.7 C) Temp Source: Oral Pulse Rate: 95 Resp: 18 BP: 139/80 Patient Position (if appropriate): Lying Oxygen Therapy SpO2: 97 % O2 Device: Tracheostomy Collar Home Living/Prior Functioning Home Living Available Help at Discharge: Family;Friend(s) Type of Home: House Home Access: Stairs to enter Technical brewer of Steps: 4 Entrance Stairs-Rails: Right;Left Home Layout: One level Bathroom Shower/Tub: Product/process development scientist: Standard  Lives With: Spouse Prior Function Level of Independence: Independent with basic ADLs;Independent with homemaking with ambulation;Independent with gait;Independent with transfers  Able to Take Stairs?: Yes Driving: Yes Vocation: Retired Radiographer, therapeutic - Assessment Additional Comments: L eye sutured to protect eye Perception Comments: WFL  Cognition Overall Cognitive Status:  Impaired/Different from baseline Arousal/Alertness: Awake/alert Orientation Level: Oriented to person;Oriented to place;Oriented to situation;Disoriented to time Attention: Sustained Memory: Impaired Memory Impairment: Decreased short term memory;Storage deficit;Decreased recall of new information Decreased Short Term Memory: Functional basic;Verbal basic Awareness: Appears intact Problem Solving: Impaired Problem Solving Impairment: Functional complex Safety/Judgment: Appears intact Sensation Sensation Light Touch: Appears Intact (c/o severe sensitivity through feet with pressure on her feet) Stereognosis: Not tested Hot/Cold: Not tested Proprioception: Impaired by gross assessment Coordination Gross Motor Movements are Fluid and Coordinated: No Fine Motor Movements are Fluid and Coordinated: No Coordination and Movement Description: severe weakness in BUE and BLE Finger Nose Finger Test: not tested as pt moves arms very slowly in a labored manner Motor  Motor Motor - Skilled Clinical Observations: severe weakness in all limbs and moderate weakness in trunk  Mobility Bed Mobility Bed Mobility: Supine to Sit Supine to Sit: 3: Mod assist;With rails;HOB elevated Supine to Sit Details: Verbal cues for technique;Verbal cues for precautions/safety;Tactile cues for weight shifting;Manual facilitation for weight shifting Transfers Transfers: Yes Sit to Stand: 2: Max assist;1: +2 Total assist;From bed Sit to Stand Details: Verbal cues for sequencing;Verbal cues for technique;Tactile cues for weight shifting;Tactile cues for placement Sit to Stand Details (indicate cue type and reason): from EOB +2 "three muskateers" and assist for pulling up pants; mod/maxA sit >stand in parallel bars Stand to Sit: 2: Max assist Stand to Sit Details (indicate cue type and reason): Verbal cues for technique;Tactile cues for weight shifting;Verbal cues for sequencing;Tactile cues for posture Stand to Sit  Details: poor eccentric control Lateral/Scoot Transfers: 3: Mod assist;With slide board Lateral/Scoot Transfer Details: Verbal cues for technique;Tactile cues for weight shifting;Tactile cues for placement;Verbal cues for precautions/safety Locomotion  Ambulation Ambulation: No Gait Gait: No Stairs / Additional Locomotion Stairs: No Wheelchair Mobility Wheelchair Mobility: Yes Wheelchair Assistance: 5: Investment banker, operational Details: Verbal cues for technique;Verbal cues for Information systems manager: Both upper extremities Wheelchair Parts Management: Needs assistance Distance: 100'  Trunk/Postural Assessment  Cervical Assessment Cervical Assessment: Exceptions to [facility] (slightly forward head position) Thoracic Assessment Thoracic Assessment: Exceptions to [facility] (kyphosis from weakness) Lumbar Assessment Lumbar Assessment: Exceptions to [facility] (posterior tilt ) Postural Control Postural Control: Deficits on evaluation Trunk Control: limited due to weakness  Balance Static Sitting Balance Static Sitting - Level of Assistance: 5: Stand by assistance Dynamic Sitting Balance Dynamic Sitting -  Level of Assistance: 3: Mod assist Sitting balance - Comments: to reach and wt shift outside of BOS Static Standing Balance Static Standing - Level of Assistance: 1: +1 Total assist Extremity Assessment  RUE Assessment RUE Assessment: Exceptions to [facility] RUE AROM (degrees) RUE Overall AROM Comments: shoulder flexion 100-120 degress RUE Strength RUE Overall Strength Comments: 3-/5 proximally, 3/5 grasp LUE Assessment LUE Assessment: Exceptions to WFL LUE AROM (degrees) LUE Overall AROM Comments: shoulder flexion 80 with limited elbow flexion/extension of -20 LUE Strength LUE Overall Strength Comments: 3-/5 proximally, 3/5 grasp RLE Assessment RLE Assessment: Exceptions to [facility] RLE Strength Right Hip Flexion: 3-/5 Right Knee Flexion: 3/5 Right Knee Extension:  4-/5 Right Ankle Dorsiflexion: 3+/5 Right Ankle Plantar Flexion: 4-/5 LLE Assessment LLE Assessment: Exceptions to [facility] LLE Strength Left Hip Flexion: 2-/5 Left Knee Flexion: 3-/5 Left Knee Extension: 3/5 Left Ankle Dorsiflexion: 3/5 Left Ankle Plantar Flexion: 4-/5   See Function Navigator for Current Functional Status.   Refer to Care Plan for Long Term Goals  Recommendations for other services: Therapeutic Recreation  Pet therapy, Kitchen group, Stress management and Outing/community reintegration  Discharge Criteria: Patient will be discharged from PT if patient refuses treatment 3 consecutive times without medical reason, if treatment goals not met, if there is a change in medical status, if patient makes no progress towards goals or if patient is discharged from hospital.  The above assessment, treatment plan, treatment alternatives and goals were discussed and mutually agreed upon: by patient and by family  Luberta Mutter 02/05/2017, 3:55 PM

## 2017-02-05 NOTE — Progress Notes (Signed)
Initial Nutrition Assessment  DOCUMENTATION CODES:   Not applicable  INTERVENTION:  48 hour calorie count initiated.   Provide Ensure Enlive po TID, each supplement provides 350 kcal and 20 grams of protein.  Encourage adequate PO intake.   NUTRITION DIAGNOSIS:   Inadequate oral intake related to poor appetite as evidenced by meal completion < 50%.  GOAL:   Patient will meet greater than or equal to 90% of their needs  MONITOR:   PO intake, Supplement acceptance, Labs, Weight trends, Skin, I & O's  REASON FOR ASSESSMENT:   Consult Calorie Count  ASSESSMENT:   66 y.o. right handed female with history of Chronic cystitis, hypertension, depression chronic low back pain. Patient to be areflexic of the lower extremities suspect GBS. On 01/15/2017 develop increasing weakness and difficulty with secretions pulmonary care medicine consulted for deterioration in respiratory status. She was intubated with ventilatory support. Prolonged intubation underwent tracheostomy 01/23/2017 per Dr. Nelda Marseille as well as placement of gastrostomy tube 01/26/2017 per interventional radiology and  barium swallow 02/04/2017 with diet advanced to regular.   Meal completion has been 10-25%. Pt reports appetite is just "ok" and it has been improving. Pt does report she thinks her meals are too large for her and she is unable to finish her food at meals. Pt reports eating well at home with usual consumption of at least 2 meals a day. Usual body weight reported to be ~155 lbs. Pt with a 3.2% weight loss from usual body weight (not found significant). Pt is agreeable to nutritional supplements to aid in caloric and protein needs. RD to order. Pt encouraged to eat her foods at meals. RD consulted for calorie count. RD to follow up tomorrow with day 1 calorie count results.   Pt with no observed significant fat or muscle mass loss.   Labs and medications reviewed.   Diet Order:  Diet regular Room service  appropriate? Yes; Fluid consistency: Thin  Skin:  Wound (see comment) (Stage I on sacrum)  Last BM:  2/21  Height:   Ht Readings from Last 1 Encounters:  01/15/17 5\' 2"  (1.575 m)    Weight:   Wt Readings from Last 1 Encounters:  02/04/17 150 lb 2.1 oz (68.1 kg)    Ideal Body Weight:  50 kg  BMI:  There is no height or weight on file to calculate BMI.  Estimated Nutritional Needs:   Kcal:  1700-1900  Protein:  80-90 grams  Fluid:  1.7 - 1.9 L/day  EDUCATION NEEDS:   No education needs identified at this time  Corrin Parker, MS, RD, LDN Pager # [phone] After hours/ weekend pager # [phone]

## 2017-02-05 NOTE — Progress Notes (Signed)
[facility]     PROGRESS NOTE    Subjective/Complaints: Had a fair night. PMV missing. Ready to eat breakfast!  ROS: pt denies nausea, vomiting, diarrhea, cough, shortness of breath or chest pain   Objective: Vital Signs: Blood pressure 132/64, pulse (!) 102, temperature 97.8 F (36.6 C), temperature source Oral, resp. rate (!) 185, SpO2 97 %. Dg Swallowing Func-speech Pathology  Result Date: 02/04/2017 Objective Swallowing Evaluation: Type of Study: MBS-Modified Barium Swallow Study Patient Details Name: Jobana Gruszka MRN: [id number] Date of Birth: 1951-10-23 Today's Date: 02/04/2017 Time: SLP Start Time (ACUTE ONLY): 1130-SLP Stop Time (ACUTE ONLY): 1150 SLP Time Calculation (min) (ACUTE ONLY): 20 min Past Medical History: Past Medical History: Diagnosis Date . Anxiety  . Arthritis   back . Chronic cystitis  . Chronic low back pain  . Depression  . Diverticulosis of colon  . Hemorrhoids  . History of colon polyps   09-24-2009  rectal hyperplastic polyp/   and 2016 non-cancerous . History of endometriosis  . History of panic attacks  . Hyperlipidemia  . Osteopenia  . Pelvic pain  . Sensation of pressure in bladder area  . Wears glasses  Past Surgical History: Past Surgical History: Procedure Laterality Date . BUNIONECTOMY Left 2004 approx . CARPAL TUNNEL RELEASE Bilateral right 2014;  left 2007 . COLONOSCOPY W/ POLYPECTOMY  09/24/2009 . CYSTO WITH HYDRODISTENSION [name] 10/15/2016  Procedure: CYSTOSCOPY/HYDRODISTENSION AND MARCAINE INSTILLATION;  Surgeon: Rana Snare, MD;  Location: [facility];  Service: Urology;  Laterality: [name]; . CYSTO/  HYDRODISTENTION/  INSTILLATION THERAPY  1997 approx . HYSTEROSCOPY W/D&C  11/01/2004  POLYPECTOMY . IR GENERIC HISTORICAL  01/26/2017  IR GASTROSTOMY TUBE MOD SED 01/26/2017 Sandi Mariscal, MD [facility]-INTER[facility] . LAPAROSCOPIC VAGINAL HYSTERECTOMY WITH SALPINGO OOPHORECTOMY Bilateral 04/20/2012 . TUBAL LIGATION  yrs ago  HPI: Patient is a 66 y.o. female with medical history significant of chronic low back pain, anxiety, depression, hypertension-recently had sorethroat (+strep) presented to the ED on 1/29 with lower ext weakness,tingling numbess that started in the lower extremities and subsequently started involving her upper extremities. Unable to elicit DTR in the lower ext. MRI of the entire spine negative. 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 Progressed to respiratory failure and required intubation 2/1, trach 2/9, tolerating trach collar with vent on standby, per respiratory patient on 30% ATC pt tolerated well SATS 98%, HR 90, RR 23, NIF -22 cmH2O x3, VC 1.1L X3.  Subjective: "thats wonderful" - about barium Assessment / Plan / Recommendation CHL IP CLINICAL IMPRESSIONS 02/04/2017 Clinical Impression  Pt demonstrates mild deficits including decreased labial seal, intermittent delayed swallow initaiton and slightly decreased vestibular closure during the swallow. Epiglottis fully deflects, but pt still has mild penetration of thin liquids to the cords that is mostly expelled during the swallow. Vocal cord adduction is firm and only a very trace amount of liquid lingers on vocal folds post swallow intermittently, without sensation. A cued throat clear consistently expels penetrate. Pt will need a lidded sippy cup for self feeding as her UE control is a little unsteady for an open cup and she struggles with labial seal for a straw due to left CN VII weakness. Recommend pt initiate a regular diet and thin liquids with an intermittent throat clear. SLP to f[facility]u at [facility].   SLP Visit Diagnosis Dysphagia, oropharyngeal phase (R13.12) Attention and concentration deficit following -- Frontal lobe and executive function deficit following -- Impact on safety and function Mild aspiration  risk   CHL IP TREATMENT RECOMMENDATION 02/04/2017 Treatment Recommendations Defer treatment plan to f/u with SLP   Prognosis 01/15/2017  Prognosis for Safe Diet Advancement Good Barriers to Reach Goals -- Barriers/Prognosis Comment -- CHL IP DIET RECOMMENDATION 02/04/2017 SLP Diet Recommendations Regular solids;Thin liquid Liquid Administration via Cup;Other (Comment) Medication Administration Whole meds with puree Compensations Slow rate;Small sips/bites Postural Changes Remain semi-upright after after feeds/meals (Comment);Seated upright at 90 degrees   CHL IP OTHER RECOMMENDATIONS 02/04/2017 Recommended Consults -- Oral Care Recommendations Oral care BID Other Recommendations --   CHL IP FOLLOW UP RECOMMENDATIONS 02/04/2017 Follow up Recommendations Inpatient Rehab   CHL IP FREQUENCY AND DURATION 01/29/2017 Speech Therapy Frequency (ACUTE ONLY) min 3x week Treatment Duration --      CHL IP ORAL PHASE 02/04/2017 Oral Phase Impaired Oral - Pudding Teaspoon -- Oral - Pudding Cup -- Oral - Honey Teaspoon -- Oral - Honey Cup -- Oral - Nectar Teaspoon WFL Oral - Nectar Cup WFL Oral - Nectar Straw -- Oral - Thin Teaspoon -- Oral - Thin Cup WFL Oral - Thin Straw Other (Comment) Oral - Puree WFL Oral - Mech Soft -- Oral - Regular WFL Oral - Multi-Consistency -- Oral - Pill -- Oral Phase - Comment --  CHL IP PHARYNGEAL PHASE 02/04/2017 Pharyngeal Phase Impaired Pharyngeal- Pudding Teaspoon -- Pharyngeal -- Pharyngeal- Pudding Cup -- Pharyngeal -- Pharyngeal- Honey Teaspoon -- Pharyngeal -- Pharyngeal- Honey Cup -- Pharyngeal -- Pharyngeal- Nectar Teaspoon WFL Pharyngeal -- Pharyngeal- Nectar Cup Delayed swallow initiation-pyriform sinuses Pharyngeal -- Pharyngeal- Nectar Straw -- Pharyngeal -- Pharyngeal- Thin Teaspoon -- Pharyngeal -- Pharyngeal- Thin Cup Delayed swallow initiation-pyriform sinuses;WFL;Penetration/Aspiration during swallow;Reduced airway/laryngeal closure Pharyngeal Material enters airway, CONTACTS cords and then ejected out;Material enters airway, remains ABOVE vocal cords and not ejected out;Material enters airway, CONTACTS cords and not  ejected out;Material does not enter airway Pharyngeal- Thin Straw Delayed swallow initiation-pyriform sinuses;WFL;Penetration/Aspiration during swallow;Reduced airway/laryngeal closure Pharyngeal -- Pharyngeal- Puree WFL Pharyngeal -- Pharyngeal- Mechanical Soft -- Pharyngeal -- Pharyngeal- Regular WFL Pharyngeal -- Pharyngeal- Multi-consistency -- Pharyngeal -- Pharyngeal- Pill Delayed swallow initiation-pyriform sinuses;WFL;Penetration/Aspiration during swallow;Reduced airway/laryngeal closure Pharyngeal -- Pharyngeal Comment --  No flowsheet data found. No flowsheet data found. Herbie Baltimore, MA CCC-SLP [phone] Lynann Beaver 02/04/2017, 2:53 PM               Recent Labs  02/04/17 1916 02/05/17 0443  WBC 10.7* 9.4  HGB 10.6* 11.2*  HCT 33.8* 35.4*  PLT 416* 405*    Recent Labs  02/04/17 1916 02/05/17 0443  NA  --  136  K  --  3.8  CL  --  98*  GLUCOSE  --  111*  BUN  --  17  CREATININE 0.60 0.68  CALCIUM  --  9.7   CBG (last 3)   Recent Labs  02/04/17 0849 02/04/17 1158 02/04/17 1604  GLUCAP 159* 159* 129*    Wt Readings from Last 3 Encounters:  02/04/17 68.1 kg (150 lb 2.1 oz)  10/15/16 72.7 kg (160 lb 5 oz)  12/05/13 75.7 kg (166 lb 12.8 oz)    Physical Exam:  Constitutional: She is oriented to person, place, and time. She appears well-developed and well-nourished.  HENT:  Head: Normocephalic.  Eyes:  Left lid healing gauze/sutures Right pupil round reactive to light  Neck:  Tracheostomy tube in place, no PMV Cardiovascular: RRR   Respiratory: CTA B.  Fair inspiratory effort.  GI: Soft. Bowel sounds are normal. She exhibits no distension. There is  no tenderness.  PEG tube in place, area clean and dry  Musculoskeletal: She exhibits no edema or tenderness.  Neurological: She is alert and oriented to person, place, and time.  Motor:  LUE: 4/5 proximally, 4-/5 distally LLE: 2 to 3-/5 proximally, 4-/5 distally RUE: 4/5 proximal to distal RLE: 3  to 3+/5 proximally, 4/5 distally Sensation diminished to light touch bilateral feet to ankles and to a lesser extent finger tips DTRs absent b/l LE  Skin: Skin is warm and dry.  Psychiatric: pleasant  Assessment/Plan: 1. 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 Physiatrist is providing close team supervision and 24 hour management of active medical problems listed below. Physiatrist and rehab team continue to assess barriers to discharge/monitor patient progress toward functional and medical goals.  Function:  Bathing Bathing position      Bathing parts      Bathing assist        Upper Body Dressing/Undressing Upper body dressing                    Upper body assist        Lower Body Dressing/Undressing Lower body dressing                                  Lower body assist        Toileting Toileting          Toileting assist     Transfers Chair/bed transfer Chair/bed transfer activity did not occur: Safety/medical concerns           Manufacturing systems engineer          Cognition Comprehension Comprehension assist level: Follows basic conversation/direction with extra time/assistive device  Expression Expression assist level: Expresses basic needs/ideas: With extra time/assistive device  Social Interaction Social Interaction assist level: Interacts appropriately 90% of the time - Needs monitoring or encouragement for participation or interaction.  Problem Solving Problem solving assist level: Solves basic 75 - 89% of the time/requires cueing 10 - 24% of the time  Memory Memory assist level: Recognizes or recalls 75 - 89% of the time/requires cueing 10 - 24% of the time   Medical Problem List and Plan: 1.  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 IVIG course completed. No current plans for plasma exchange  -continue CIR  therapies 2.  DVT Prophylaxis/Anticoagulation: Subcutaneous heparin. Vascular study pending 3. Pain Management: Lidoderm patch, Duragesic patch  -pain appears controlled at present 4. Mood: Zoloft 50 mg daily, Ativan as needed 5. Neuropsych: This patient is capable of making decisions on her own behalf. 6. Skin/Wound Care: Routine skin checks 7. Fluids/Electrolytes/Nutrition: I personally reviewed the patient's labs today. All acceptable 8. Tracheostomy 01/23/2017. Changed to #6 Cuffless Portex 02/03/2017 per Dr. Titus Mould  -tolerating well  -PMV during day 9. Gastrostomy tube 01/26/2017 per interventional radiology. Diet advanced to regular 02/04/2017 after modified barium swallow Follow-up speech therapy 10. Atrial fibrillation/bradycardia. Cardiac rate controlled at present. Follow-up with cardiology services 11. 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 Underwent tarorhaphy 02/04/2017. Continue erythromycin ointment for now follow-up ophthalmology services as needed  -eye clean/non-irritated 12. Hyponatremia. Resolved. Sodium 136 today. 13. Abnormal CXR: Clinically improving, will consider further imaging if necessary.    LOS (Days) 1 A FACE TO FACE EVALUATION WAS PERFORMED  SWARTZ,ZACHARY  T, MD 02/05/2017 9:48 AM

## 2017-02-05 NOTE — Progress Notes (Signed)
Progress Note Ophthalmology  Patient in no eye pan.  Tarsorrhaphy has loosened a little.   Exam:  OD:  WNL.  Blink normal  OS:   Eye is open nasally 2 mm lagophthalmos.  Temporally closed by tarsorrhaphy.    Ointment is in place.  Epithelial defect has healed.  Less conjunctival injection.   Impression: 1.  Resolved exposure keratopathy OD.  OK to D/C ointment OD  2.  Improving exposure keratopathy OS.  Continue ointment OS qid.  Call me with problems. Luberta Mutter [phone]

## 2017-02-05 NOTE — Progress Notes (Signed)
Patient information reviewed and entered into eRehab system by Rehan Holness, RN, CRRN, PPS Coordinator.  Information including medical coding and functional independence measure will be reviewed and updated through discharge.     Per nursing patient was given "Data Collection Information Summary for Patients in Inpatient Rehabilitation Facilities with attached "Privacy Act Statement-Health Care Records" upon admission.  

## 2017-02-05 NOTE — Evaluation (Signed)
Occupational Therapy Assessment and Plan  Patient Details  Name: Joanne Morrison MRN: [id number] Date of Birth: 09-04-1951  OT Diagnosis: acute pain, cognitive deficits and muscle weakness (generalized) Rehab Potential: Rehab Potential (ACUTE ONLY): Good ELOS: 20-21 days   Today's Date: 02/05/2017 OT Individual Time: 1017-5102 OT Individual Time Calculation (min): 60 min     Problem List:  Patient Active Problem List   Diagnosis Date Noted  . Numbness and tingling   . S/P percutaneous endoscopic gastrostomy (PEG) tube placement ([place])   . Bradycardia   . Left corneal abrasion   . Abnormal chest x-ray   . Acute respiratory failure with hypoxia ([place])   . Guillain Barr syndrome ([place])   . Atrial fibrillation with RVR ([place])   . Dysphagia, pharyngoesophageal phase   . History of ETT   . Encounter for central line placement   . Encounter for feeding tube placement   . Weakness of right lower extremity   . SOB (shortness of breath)   . Pneumonia   . Pressure injury of skin 01/16/2017  . Acute respiratory failure ([place])   . Hyponatremia with decreased serum osmolality   . Hyponatremia   . Quadriplegia and quadriparesis ([place]) 01/14/2017  . GBS (Guillain Barre syndrome) ([place]) 01/13/2017  . Weakness 01/12/2017  . Hypokalemia 01/12/2017  . Endometriosis 12/05/2013  . Chronic low back pain 08/19/2012  . HYPERLIPIDEMIA 12/10/2010  . COLONIC POLYPS, HX OF 12/13/2009  . HYPERGLYCEMIA, FASTING 11/22/2008  . Depression 02/07/2008  . ANOSMIA 02/07/2008  . HEADACHE, CHRONIC 02/07/2008  . MITRAL VALVE PROLAPSE 10/05/2007  . OSTEOPENIA 10/05/2007    Past Medical History:  Past Medical History:  Diagnosis Date  . Anxiety   . Arthritis    back  . Chronic cystitis   . Chronic low back pain   . Depression   . Diverticulosis of colon   . Hemorrhoids   . History of colon polyps    09-24-2009  rectal hyperplastic polyp/   and 2016 non-cancerous  . History of endometriosis   . History  of panic attacks   . Hyperlipidemia   . Osteopenia   . Pelvic pain   . Sensation of pressure in bladder area   . Wears glasses    Past Surgical History:  Past Surgical History:  Procedure Laterality Date  . BUNIONECTOMY Left 2004 approx  . CARPAL TUNNEL RELEASE Bilateral right 2014;  left 2007  . COLONOSCOPY W/ POLYPECTOMY  09/24/2009  . CYSTO WITH HYDRODISTENSION [name] 10/15/2016   Procedure: CYSTOSCOPY/HYDRODISTENSION AND MARCAINE INSTILLATION;  Surgeon: Rana Snare, MD;  Location: [facility];  Service: Urology;  Laterality: [name];  . CYSTO/  HYDRODISTENTION/  INSTILLATION THERAPY  1997 approx  . HYSTEROSCOPY W/D&C  11/01/2004   POLYPECTOMY  . IR GENERIC HISTORICAL  01/26/2017   IR GASTROSTOMY TUBE MOD SED 01/26/2017 Sandi Mariscal, MD [facility]  . LAPAROSCOPIC VAGINAL HYSTERECTOMY WITH SALPINGO OOPHORECTOMY Bilateral 04/20/2012  . TUBAL LIGATION  yrs ago    Assessment & Plan Clinical Impression:Joanne Morrison is a 66 y.o. right handed female with history of Chronic cystitis, hypertension, depression chronic low back pain. Per chart review, patient, and husband, patient lives with spouse. One level home for steps to entry. Patient had been independent prior to admission living with spouse who works during the day. Present 01/12/2017 with lower extremity weakness/numbness left greater than right, myalgias, left upper extremity weakness for the past 3-5 days as well as urinary retention. By report recently placed on Zithromax for  strep throat. She was seen in the [place] emergency room 01/11/2017 but was discharged to home before returning with increasing weakness. MRI of the brain and entire spine negative for any significant acute structural issues. No cord compression or primary cord lesions noted. 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 Placed on IVIG 5 doses.Lumbar puncture notes CSF protein elevation without elevated  WBC. Physical therapy evaluation completed 01/13/2017 with recommendations of physical medicine rehabilitation consult. On 01/15/2017 develop increasing weakness and difficulty with secretions pulmonary care medicine consulted for deterioration in respiratory status. She was intubated with ventilatory support. She again continued on IVIG per neurology. Found to have profound hyponatremia 122 placed on normal saline IV fluids and follow-up assistance provided by nephrology services for her hyponatremia. Initially a nasogastric tube was placed for nutritional support. Patient had a run of nonsustained episodes of atrial fibrillation on telemetry as well as bouts of bradycardia of 45. Cardiology service  consulted 01/22/2017 and continues to monitor with conservative care and no further workup indicated. Echocardiogram completed with ejection fraction of 70% no wall motion abnormalities. Prolonged intubation underwent tracheostomy 01/23/2017 per Dr. Nelda Marseille as well as placement of gastrostomy tube 01/26/2017 per interventional radiology and  barium swallow 02/04/2017 with diet advanced to regular. CXR 2/13 reviewed, showing persistent basilar abnormalities Her tracheostomy was changed to a #6 cuffless Portex 02/03/2017 per Dr. Titus Mould. Dr. Ronny Bacon. 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 Ophthalmology to return tomorrow. Patient was placed on erythromycin ointment as directed. Subcutaneous heparin for DVT prophylaxis. Physical occupational therapy evaluations completed and ongoing. Patient was admitted for a comprehensive rehabilitation program. Patient transferred to CIR on 02/04/2017 .    Patient currently requires max with basic self-care skills secondary to muscle weakness, decreased cardiorespiratoy endurance, unbalanced muscle activation and decreased coordination, decreased memory and  decreased sitting balance, decreased standing balance and decreased postural control.  Prior to hospitalization, patient was fully independent, driving, active. Patient will benefit from skilled intervention to increase independence with basic self-care skills prior to discharge home with care partner.  Anticipate patient will require intermittent supervision and follow up home health.  OT - End of Session Activity Tolerance: Tolerates < 10 min activity with changes in vital signs (HR elevated 114) Endurance Deficit: Yes OT Assessment Rehab Potential (ACUTE ONLY): Good OT Patient demonstrates impairments in the following area(s): Balance;Cognition;Endurance;Motor;Sensory OT Basic ADL's Functional Problem(s): Bathing;Dressing;Toileting OT Transfers Functional Problem(s): Toilet;Tub/Shower OT Additional Impairment(s): None;Fuctional Use of Upper Extremity OT Plan OT Intensity: Minimum of 1-2 x/day, 45 to 90 minutes OT Frequency: 5 out of 7 days OT Duration/Estimated Length of Stay: 20-21 days OT Treatment/Interventions: Balance/vestibular training;Cognitive remediation/compensation;Discharge planning;DME/adaptive equipment instruction;Functional mobility training;Neuromuscular re-education;Patient/family education;Psychosocial support;Self Care/advanced ADL retraining;Therapeutic Activities;Therapeutic Exercise;UE/LE Strength taining/ROM;UE/LE Coordination activities OT Self Feeding Anticipated Outcome(s): I OT Basic Self-Care Anticipated Outcome(s): supervision OT Toileting Anticipated Outcome(s): supervision OT Bathroom Transfers Anticipated Outcome(s): supervision OT Recommendation Recommendations for Other Services: Therapeutic Recreation consult;Neuropsych consult Therapeutic Recreation Interventions: Stress management Patient destination: Home Follow Up Recommendations: Home health OT Equipment Recommended: Tub/shower bench   Skilled Therapeutic Intervention Pt seen for OT  evaluation and ADL training to a limited extent.  Pt received in bed and stated she was anxious for a shower.  Explained that today there may not be enough time and we would need to determine the safest transfer method first. Pt agreeable.  She stated that  she is having great difficulty putting weight through her feet due to severe sensitivity.  She sat to EOB with max A and maintained her balance with S. Spouse sat with pt while therapist retrieved a w/c.  Used slide board with max A to chair to bathe at sink and complete UB self care. Nurse tech informed us pt needed to be back in bed without LB clothing on.  Used board with pad for protection to get into bed.  Recommended to NT to don pants in bed post procedure.  Education with pt and spouse on OT purpose, expected goals, LOS, and focus of treatment. Pt agreeable and resting in bed with all needs met.  OT Evaluation Precautions/Restrictions  Precautions Precautions: Fall Precaution Comments: trach    Vital Signs Therapy Vitals Pulse Rate: 114 during OT session Resp: 18 Patient Position (if appropriate): Lying Oxygen Therapy SpO2: 97 % O2 Device: Tracheostomy Collar Pain Pain Assessment Pain Assessment: No/denies pain Home Living/Prior Functioning Home Living Available Help at Discharge: Family, Friend(s) Type of Home: House Home Access: Stairs to enter Technical brewer of Steps: 4 Entrance Stairs-Rails: Right, Left Home Layout: One level Bathroom Shower/Tub: Tub/shower unit, Architectural technologist: Standard  Lives With: Spouse Prior Function Level of Independence: Independent with basic ADLs, Independent with homemaking with ambulation, Independent with gait, Independent with transfers  Able to Take Stairs?: Yes Driving: Yes Vocation: Retired ADL ADL ADL Comments: refer to functional navigator Vision/Perception  Vision- History Baseline Vision/History: Wears glasses Wears Glasses: At all times Patient Visual  Report: No change from baseline (R EYE ONLY; L eye cannot see due to corneal abrasion and sutured shut) Vision- Assessment Vision Assessment?: No apparent visual deficits Additional Comments: L eye sutured to protect eye Perception Comments: WFL  Cognition Overall Cognitive Status: Impaired/Different from baseline Arousal/Alertness: Awake/alert Orientation Level: Person;Place;Situation Person: Oriented Place: Oriented Situation: Oriented Year: 2018 Month: February Day of Week: Incorrect (stated Wednesday) Memory: Impaired Memory Impairment: Decreased short term memory Decreased Short Term Memory: Functional basic;Verbal basic Immediate Memory Recall: Sock;Blue;Bed Memory Recall: Sock;Blue;Bed Memory Recall Sock: With Cue Memory Recall Blue: Without Cue Memory Recall Bed: Without Cue Awareness: Appears intact Problem Solving: Impaired Problem Solving Impairment: Functional complex Sensation Sensation Light Touch: Appears Intact (c/o severe sensitivity through feet with pressure on her feet) Stereognosis: Not tested Hot/Cold: Not tested Proprioception: Impaired by gross assessment Coordination Gross Motor Movements are Fluid and Coordinated: No Fine Motor Movements are Fluid and Coordinated: No Coordination and Movement Description: severe weakness in BUE and BLE Finger Nose Finger Test: not tested as pt moves arms very slowly in a labored manner Motor  Motor Motor - Skilled Clinical Observations: severe weakness in all limbs and moderate weakness in trunk Mobility    refer to functional navigator Trunk/Postural Assessment  Cervical Assessment Cervical Assessment: Exceptions to [facility] (slightly forward head position) Thoracic Assessment Thoracic Assessment: Exceptions to [facility] (kyphosis from weakness) Lumbar Assessment Lumbar Assessment: Exceptions to [facility] (posterior tilt ) Postural Control Postural Control: Deficits on evaluation Trunk Control: limited due to weakness   Balance Static Sitting Balance Static Sitting - Level of Assistance: 5: Stand by assistance Dynamic Sitting Balance Dynamic Sitting - Level of Assistance: 3: Mod assist Sitting balance - Comments: to reach and wt shift outside of BOS Static Standing Balance Static Standing - Level of Assistance: 1: +1 Total assist Extremity/Trunk Assessment RUE Assessment RUE Assessment: Exceptions to [facility] RUE AROM (degrees) RUE Overall AROM Comments: shoulder flexion 100-120 degress RUE Strength RUE Overall Strength  Comments: 3-/5 proximally, 3/5 grasp LUE Assessment LUE Assessment: Exceptions to WFL LUE AROM (degrees) LUE Overall AROM Comments: shoulder flexion 80 with limited elbow flexion/extension of -20 LUE Strength LUE Overall Strength Comments: 3-/5 proximally, 3/5 grasp   See Function Navigator for Current Functional Status.   Refer to Care Plan for Long Term Goals  Recommendations for other services: Neuropsych and Therapeutic Recreation  Stress management   Discharge Criteria: Patient will be discharged from OT if patient refuses treatment 3 consecutive times without medical reason, if treatment goals not met, if there is a change in medical status, if patient makes no progress towards goals or if patient is discharged from hospital.  The above assessment, treatment plan, treatment alternatives and goals were discussed and mutually agreed upon: by patient and by family  Kamoria Lucien 02/05/2017, 1:19 PM

## 2017-02-05 NOTE — Evaluation (Signed)
Speech Language Pathology Assessment and Plan  Patient Details  Name: Joanne Morrison MRN: [id number] Date of Birth: 04-26-1951  SLP Diagnosis: Dysphagia;Cognitive Impairments;Speech and Language deficits  Rehab Potential: Excellent ELOS: 3 weeks     Today's Date: 02/05/2017 SLP Individual Time: 1300-1400 SLP Individual Time Calculation (min): 60 min   Problem List:  Patient Active Problem List   Diagnosis Date Noted  . Numbness and tingling   . S/P percutaneous endoscopic gastrostomy (PEG) tube placement ([place])   . Bradycardia   . Left corneal abrasion   . Abnormal chest x-ray   . Acute respiratory failure with hypoxia ([place])   . Guillain Barr syndrome ([place])   . Atrial fibrillation with RVR ([place])   . Dysphagia, pharyngoesophageal phase   . History of ETT   . Encounter for central line placement   . Encounter for feeding tube placement   . Weakness of right lower extremity   . SOB (shortness of breath)   . Pneumonia   . Pressure injury of skin 01/16/2017  . Acute respiratory failure ([place])   . Hyponatremia with decreased serum osmolality   . Hyponatremia   . Quadriplegia and quadriparesis ([place]) 01/14/2017  . GBS (Guillain Barre syndrome) ([place]) 01/13/2017  . Weakness 01/12/2017  . Hypokalemia 01/12/2017  . Endometriosis 12/05/2013  . Chronic low back pain 08/19/2012  . HYPERLIPIDEMIA 12/10/2010  . COLONIC POLYPS, HX OF 12/13/2009  . HYPERGLYCEMIA, FASTING 11/22/2008  . Depression 02/07/2008  . ANOSMIA 02/07/2008  . HEADACHE, CHRONIC 02/07/2008  . MITRAL VALVE PROLAPSE 10/05/2007  . OSTEOPENIA 10/05/2007   Past Medical History:  Past Medical History:  Diagnosis Date  . Anxiety   . Arthritis    back  . Chronic cystitis   . Chronic low back pain   . Depression   . Diverticulosis of colon   . Hemorrhoids   . History of colon polyps    09-24-2009  rectal hyperplastic polyp/   and 2016 non-cancerous  . History of endometriosis   . History of panic attacks   .  Hyperlipidemia   . Osteopenia   . Pelvic pain   . Sensation of pressure in bladder area   . Wears glasses    Past Surgical History:  Past Surgical History:  Procedure Laterality Date  . BUNIONECTOMY Left 2004 approx  . CARPAL TUNNEL RELEASE Bilateral right 2014;  left 2007  . COLONOSCOPY W/ POLYPECTOMY  09/24/2009  . CYSTO WITH HYDRODISTENSION [name] 10/15/2016   Procedure: CYSTOSCOPY/HYDRODISTENSION AND MARCAINE INSTILLATION;  Surgeon: Rana Snare, MD;  Location: [facility];  Service: Urology;  Laterality: [name];  . CYSTO/  HYDRODISTENTION/  INSTILLATION THERAPY  1997 approx  . HYSTEROSCOPY W/D&C  11/01/2004   POLYPECTOMY  . IR GENERIC HISTORICAL  01/26/2017   IR GASTROSTOMY TUBE MOD SED 01/26/2017 Sandi Mariscal, MD [facility]  . LAPAROSCOPIC VAGINAL HYSTERECTOMY WITH SALPINGO OOPHORECTOMY Bilateral 04/20/2012  . TUBAL LIGATION  yrs ago    Assessment / Plan / Recommendation Clinical Impression Patient is a 66 y.o.right handed femalewith history of Chronic cystitis, hypertension, depression chronic low back pain. Per chart review, patient, and husband, patient lives with spouse. One level home for steps to entry. Patient had been independent prior to [place] with spouse who works during the day. Present 01/12/2017 with lower extremity weakness/numbness left greater than right, myalgias, left upper extremity weakness for the past 3-5 days as well as urinary retention. By report recently placed on Zithromax for strep throat. She was seen in the  [place] emergency room 01/11/2017 but was discharged to home before returning with increasing weakness. MRI of the brain and entire spine negative for any significant acute structural issues. No cord compression or primary cord lesions noted. 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 Placed on IVIG 5 doses.Lumbar puncture notes CSF protein elevation without elevated WBC. Physical  therapy evaluation completed 01/13/2017 with recommendations of physical medicine rehabilitation consult. On 01/15/2017 develop increasing weakness and difficulty with secretions pulmonary care medicine consulted for deterioration in respiratory status. She was intubated with ventilatory support. She again continued on IVIG per neurology. Found to have profound hyponatremia 122 placed on normal saline IV fluids and follow-up assistance provided by nephrology services for her hyponatremia. Initially a nasogastric tube was placed for nutritional support. Patient had a run of nonsustained episodes of atrial fibrillation on telemetry as well as bouts of bradycardia of 45. Cardiology service  consulted 01/22/2017 and continues to monitor with conservative care and no further workup indicated. Echocardiogram completed with ejection fraction of 70% no wall motion abnormalities. Prolonged intubation underwent tracheostomy 01/23/2017 per Dr. Nelda Marseille as well as placement of gastrostomy tube 01/26/2017 per interventional radiology and  barium swallow 02/04/2017 with diet advanced to regular. CXR 2/13 reviewed, showing persistent basilar abnormalities Her tracheostomy was changed to a #6 cuffless Portex 02/03/2017 per Dr. Titus Mould. Dr. Ronny Bacon. 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 Ophthalmology to return tomorrow. Patient was placed on erythromycin ointment as directed. Subcutaneous heparin for DVT prophylaxis. Physical occupational therapy evaluations completed and ongoing. Patient was admitted for a comprehensive rehabilitation program 02/04/17.  Patient's PMSV was in place upon arrival and remained in place throughout evaluation. Patient's vitals, vocal intensity and vocal quality were all WFL. Recommend patient wear PMSV during all waking hours. Patient with mild dysarthria due to left facial  and labial weakness but was essentially 90-100% intelligible at the conversation level. Patient was administered the [facility] and scored 11/22 points with a score of 18 or above considered normal. Patient demonstrated deficits in the areas of working and short-term memory, attention and orientation to time. Patient consumed regular textures with thin liquids via cup without overt s/s of aspiration but demonstrated a decreased labial seal and what appeared to be a delayed swallow initiation with thin liquids. Recommend patient continue current diet of regular textures with thin liquids with intermittent supervision. Patient would benefit from skilled SLP intervention to maximize her cognitive and swallowing function as well as her overall functional independence prior to discharge.    Skilled Therapeutic Interventions          Administered a cognitive-linguistic evaluation, PMSV evaluation and BSE. Please see above for details.    SLP Assessment  Patient will need skilled Speech Lanaguage Pathology Services during CIR admission    Recommendations  Patient may use Passy-Muir Speech Valve: During all waking hours (remove during sleep);Caregiver trained to provide supervision;During PO intake/meals PMSV Supervision: Intermittent MD: Please consider changing trach tube to : Smaller size SLP Diet Recommendations: Age appropriate regular solids;Thin Liquid Administration via: Cup Medication Administration: Crushed with puree Supervision: Patient able to self feed;Intermittent supervision to cue for compensatory strategies Compensations: Slow rate;Small sips/bites Postural Changes and/or Swallow Maneuvers: Seated upright 90 degrees;Upright 30-60 min after meal Oral Care Recommendations: Oral care BID Recommendations for Other Services: Neuropsych consult Patient destination: Home Follow up Recommendations:  (TBD) Equipment Recommended: To be determined  SLP Frequency 3 to 5 out of 7 days   SLP  Duration  SLP Intensity  SLP Treatment/Interventions 3 weeks   Minumum of 1-2 x/day, 30 to 90 minutes  Cognitive remediation/compensation;Cueing hierarchy;Functional tasks;Patient/family education;Therapeutic Activities;Environmental controls;Dysphagia/aspiration precaution training;Internal/external aids;Speech/Language facilitation    Pain No/Denies Pain   Prior Functioning Type of Home: House  Lives With: Spouse Available Help at Discharge: Family;Friend(s) Vocation: Retired  Function:  Eating Eating   Modified Consistency Diet: No Eating Assist Level: Swallowing techniques: self managed;Set up assist for   Eating Set Up Assist For: Opening containers       Cognition Comprehension Comprehension assist level: Follows complex conversation/direction with extra time/assistive device  Expression   Expression assist level: Expresses complex ideas: With extra time/assistive device  Social Interaction Social Interaction assist level: Interacts appropriately with others with medication or extra time (anti-anxiety, antidepressant).  Problem Solving Problem solving assist level: Solves basic 75 - 89% of the time/requires cueing 10 - 24% of the time  Memory Memory assist level: Recognizes or recalls 75 - 89% of the time/requires cueing 10 - 24% of the time   Short Term Goals: Week 1: SLP Short Term Goal 1 (Week 1): Patient will donn PMSV with supervision verbal cues.  SLP Short Term Goal 2 (Week 1): Patient will consume current diet without overt s/s of aspiration and Mod I for use of swallowing compensatory strategies.  SLP Short Term Goal 3 (Week 1): Patient will utilize over-articulation to maximize intelligibility to 100% at the conversation level with supervision verbal cues.  SLP Short Term Goal 4 (Week 1): Patient will recall new, daily information with supervision verbal cues.  SLP Short Term Goal 5 (Week 1): Patient will complete complex problem solving tasks with  supervision verbal cues.   Refer to Care Plan for Long Term Goals  Recommendations for other services: Neuropsych  Discharge Criteria: Patient will be discharged from SLP if patient refuses treatment 3 consecutive times without medical reason, if treatment goals not met, if there is a change in medical status, if patient makes no progress towards goals or if patient is discharged from hospital.  The above assessment, treatment plan, treatment alternatives and goals were discussed and mutually agreed upon: by patient and by family  Manasvini Whatley 02/05/2017, 3:50 PM

## 2017-02-05 NOTE — Progress Notes (Signed)
Speech Language Pathology Daily Session Note  Patient Details  Name: Joanne Morrison MRN: [id number] Date of Birth: 09-05-1951  Today's Date: 02/05/2017 SLP Individual Time: 1535-1605 SLP Individual Time Calculation (min): 30 min  Short Term Goals: Week 1: SLP Short Term Goal 1 (Week 1): Patient will donn PMSV with supervision verbal cues.  SLP Short Term Goal 2 (Week 1): Patient will consume current diet without overt s/s of aspiration and Mod I for use of swallowing compensatory strategies.  SLP Short Term Goal 3 (Week 1): Patient will utilize over-articulation to maximize intelligibility to 100% at the conversation level with supervision verbal cues.  SLP Short Term Goal 4 (Week 1): Patient will recall new, daily information with supervision verbal cues.  SLP Short Term Goal 5 (Week 1): Patient will complete complex problem solving tasks with supervision verbal cues.   Skilled Therapeutic Interventions: Skilled treatment session focused on donning and duffing PMSV. SLP attached PMSV to trach collar via Secure It. SLP facilitated session by positioning pt in front of mirror, providing education/naming different parts of trach and PMSV, and demonstration of how to donn and duff. Pt able to donn and duff with Min A verbal cues and encouragement. Pt removed from mirror and able to demonstration with Min A verbal cues. PMSV left in place, pt left upright in wheelchair with husband present and all needs within reach. Continue per current plan of care.      Function:  Eating Eating   Modified Consistency Diet: No Eating Assist Level: Swallowing techniques: self managed;Set up assist for   Eating Set Up Assist For: Opening containers       Cognition Comprehension Comprehension assist level: Follows complex conversation/direction with extra time/assistive device  Expression   Expression assist level: Expresses complex ideas: With extra time/assistive device  Social Interaction Social  Interaction assist level: Interacts appropriately with others with medication or extra time (anti-anxiety, antidepressant).  Problem Solving Problem solving assist level: Solves basic 90% of the time/requires cueing < 10% of the time  Memory Memory assist level: Recognizes or recalls 90% of the time/requires cueing < 10% of the time    Pain    Therapy/Group: Individual Therapy Wylee Ogden B. Rutherford Nail, M.S., CCC-SLP Speech-Language Pathologist  Desha Bitner 02/05/2017, 4:31 PM

## 2017-02-06 ENCOUNTER — Inpatient Hospital Stay (HOSPITAL_COMMUNITY): Payer: BLUE CROSS/BLUE SHIELD | Admitting: Occupational Therapy

## 2017-02-06 ENCOUNTER — Inpatient Hospital Stay (HOSPITAL_COMMUNITY): Payer: BLUE CROSS/BLUE SHIELD | Admitting: Physical Therapy

## 2017-02-06 ENCOUNTER — Inpatient Hospital Stay (HOSPITAL_COMMUNITY): Payer: BLUE CROSS/BLUE SHIELD | Admitting: Speech Pathology

## 2017-02-06 DIAGNOSIS — Z930 Tracheostomy status: Secondary | ICD-10-CM

## 2017-02-06 NOTE — Progress Notes (Signed)
Speech Language Pathology Daily Session Note  Patient Details  Name: Joanne Morrison MRN: [id number] Date of Birth: 01/13/1951  Today's Date: 02/06/2017 SLP Individual Time: [id number] SLP Individual Time Calculation (min): 45 min  Short Term Goals: Week 1: SLP Short Term Goal 1 (Week 1): Patient will donn PMSV with supervision verbal cues.  SLP Short Term Goal 2 (Week 1): Patient will consume current diet without overt s/s of aspiration and Mod I for use of swallowing compensatory strategies.  SLP Short Term Goal 3 (Week 1): Patient will utilize over-articulation to maximize intelligibility to 100% at the conversation level with supervision verbal cues.  SLP Short Term Goal 4 (Week 1): Patient will recall new, daily information with supervision verbal cues.  SLP Short Term Goal 5 (Week 1): Patient will complete complex problem solving tasks with supervision verbal cues.   Skilled Therapeutic Interventions: Skilled treatment session focused on cognition goals. PMV in place when SLP entered room, pt appeared to be tolerating it well during all waking hours. Husband present and agrees. SLP facilitated session by providing Mod A faded to Min A verbal cues to complete complex novel card game. Pt with Mod I use of problem solving strategies and self-talked herself through strategies. Pt with difficulty recalling date, calendar provided and pt able to use with supervision cues. Pt ws left in bed with husband present and all needs within reach. Continue per current plan of care.      Function:  Eating Eating   Modified Consistency Diet: No Eating Assist Level: More than reasonable amount of time   Eating Set Up Assist For: Opening containers       Cognition Comprehension Comprehension assist level: Understands complex 90% of the time/cues 10% of the time  Expression   Expression assist level: Expresses complex ideas: With extra time/assistive device  Social Interaction Social Interaction  assist level: Interacts appropriately with others with medication or extra time (anti-anxiety, antidepressant).  Problem Solving Problem solving assist level: Solves basic 90% of the time/requires cueing < 10% of the time  Memory Memory assist level: Recognizes or recalls 75 - 89% of the time/requires cueing 10 - 24% of the time    Pain Pain Assessment Pain Assessment: No/denies pain  Therapy/Group: Individual Therapy  Everhett Bozard 02/06/2017, 12:26 PM

## 2017-02-06 NOTE — Progress Notes (Signed)
Occupational Therapy Session Note  Patient Details  Name: Joanne Morrison MRN: [id number] Date of Birth: [birth year]  Today's Date: 02/06/2017 OT Individual Time: 2479-9800 OT Individual Time Calculation (min): 60 min    Short Term Goals: Week 1:  OT Short Term Goal 1 (Week 1): Pt will be able to complete a squat pivot transfer to toilet with max A of 1.  OT Short Term Goal 2 (Week 1): Pt will be able to sit to stand with mod A of 1 using UE support during LB dressing.  OT Short Term Goal 3 (Week 1): Pt will demonstrate dynamic sitting balance on tub bench with min A during bathing. OT Short Term Goal 4 (Week 1): Pt will demonstrate improved BUE AROM to pull shirt over head with min A.  Skilled Therapeutic Interventions/Progress Updates:    Pt seen this session for ADL training of shower and dressing and pt's spouse was very helpful during the session providing A.  He stated he wants to be very involved in her care. Overall pt is demonstrating a great deal of progress with her shoulder AROM, trunk strength, strength in legs to stand up. Opted to use Stedy to tub bench for energy conservation. Used shower trach guard, and IV on forearm covered.  Pt completed bathing and then was transferred to EOB for dressing. Stood with mod A with therapist while spouse assisted pulling pants over hips.  Pt then used slide board to Wc with mod A.  Pt in w/c with spouse with her with all needs met.    Therapy Documentation Precautions:  Precautions Precautions: Fall Precaution Comments: trach Restrictions Weight Bearing Restrictions: No     Pain: Pain Assessment Pain Assessment: No/denies pain ADL: ADL ADL Comments: refer to functional navigator  See Function Navigator for Current Functional Status.   Therapy/Group: Individual Therapy  [place] 02/06/2017, 12:00 PM

## 2017-02-06 NOTE — Progress Notes (Signed)
Joanne Morrison PHYSICAL MEDICINE & REHABILITATION     PROGRESS NOTE    Subjective/Complaints: Sore from therapy. No real problems overnight. Working on her breakfast when I arrived.   ROS: pt denies nausea, vomiting, diarrhea, cough, shortness of breath or chest pain   Objective: Vital Signs: Blood pressure 134/78, pulse 94, temperature 98.2 F (36.8 C), temperature source Oral, resp. rate 18, weight 67.3 kg (148 lb 5.9 oz), SpO2 97 %. Dg Swallowing Func-speech Pathology  Result Date: 02/04/2017 Objective Swallowing Evaluation: Type of Study: MBS-Modified Barium Swallow Study Patient Details Name: Joanne Morrison MRN: [id number] Date of Birth: Aug 10, 1951 Today's Date: 02/04/2017 Time: SLP Start Time (ACUTE ONLY): 1130-SLP Stop Time (ACUTE ONLY): 1150 SLP Time Calculation (min) (ACUTE ONLY): 20 min Past Medical History: Past Medical History: Diagnosis Date . Anxiety  . Arthritis   back . Chronic cystitis  . Chronic low back pain  . Depression  . Diverticulosis of colon  . Hemorrhoids  . History of colon polyps   09-24-2009  rectal hyperplastic polyp/   and 2016 non-cancerous . History of endometriosis  . History of panic attacks  . Hyperlipidemia  . Osteopenia  . Pelvic pain  . Sensation of pressure in bladder area  . Wears glasses  Past Surgical History: Past Surgical History: Procedure Laterality Date . BUNIONECTOMY Left 2004 approx . CARPAL TUNNEL RELEASE Bilateral right 2014;  left 2007 . COLONOSCOPY W/ POLYPECTOMY  09/24/2009 . CYSTO WITH HYDRODISTENSION [name] 10/15/2016  Procedure: CYSTOSCOPY/HYDRODISTENSION AND MARCAINE INSTILLATION;  Surgeon: Rana Snare, MD;  Location: [facility];  Service: Urology;  Laterality: [name]; . CYSTO/  HYDRODISTENTION/  INSTILLATION THERAPY  1997 approx . HYSTEROSCOPY W/D&C  11/01/2004  POLYPECTOMY . IR GENERIC HISTORICAL  01/26/2017  IR GASTROSTOMY TUBE MOD SED 01/26/2017 Sandi Mariscal, MD [facility] . LAPAROSCOPIC VAGINAL HYSTERECTOMY WITH SALPINGO  OOPHORECTOMY Bilateral 04/20/2012 . TUBAL LIGATION  yrs ago HPI: Patient is a 66 y.o. female with medical history significant of chronic low back pain, anxiety, depression, hypertension-recently had sorethroat (+strep) presented to the ED on 1/29 with lower ext weakness,tingling numbess that started in the lower extremities and subsequently started involving her upper extremities. Unable to elicit DTR in the lower ext. MRI of the entire spine negative. 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 Progressed to respiratory failure and required intubation 2/1, trach 2/9, tolerating trach collar with vent on standby, per respiratory patient on 30% ATC pt tolerated well SATS 98%, HR 90, RR 23, NIF -22 cmH2O x3, VC 1.1L X3.  Subjective: "thats wonderful" - about barium Assessment / Plan / Recommendation CHL IP CLINICAL IMPRESSIONS 02/04/2017 Clinical Impression  Pt demonstrates mild deficits including decreased labial seal, intermittent delayed swallow initaiton and slightly decreased vestibular closure during the swallow. Epiglottis fully deflects, but pt still has mild penetration of thin liquids to the cords that is mostly expelled during the swallow. Vocal cord adduction is firm and only a very trace amount of liquid lingers on vocal folds post swallow intermittently, without sensation. A cued throat clear consistently expels penetrate. Pt will need a lidded sippy cup for self feeding as her UE control is a little unsteady for an open cup and she struggles with labial seal for a straw due to left CN VII weakness. Recommend pt initiate a regular diet and thin liquids with an intermittent throat clear. SLP to f/u at [facility].   SLP Visit Diagnosis Dysphagia, oropharyngeal phase (R13.12) Attention and concentration deficit following -- Frontal lobe and executive function  deficit following -- Impact on safety and function Mild aspiration risk   CHL IP TREATMENT RECOMMENDATION 02/04/2017 Treatment Recommendations Defer  treatment plan to f/u with SLP   Prognosis 01/15/2017 Prognosis for Safe Diet Advancement Good Barriers to Reach Goals -- Barriers/Prognosis Comment -- CHL IP DIET RECOMMENDATION 02/04/2017 SLP Diet Recommendations Regular solids;Thin liquid Liquid Administration via Cup;Other (Comment) Medication Administration Whole meds with puree Compensations Slow rate;Small sips/bites Postural Changes Remain semi-upright after after feeds/meals (Comment);Seated upright at 90 degrees   CHL IP OTHER RECOMMENDATIONS 02/04/2017 Recommended Consults -- Oral Care Recommendations Oral care BID Other Recommendations --   CHL IP FOLLOW UP RECOMMENDATIONS 02/04/2017 Follow up Recommendations Inpatient Rehab   CHL IP FREQUENCY AND DURATION 01/29/2017 Speech Therapy Frequency (ACUTE ONLY) min 3x week Treatment Duration --      CHL IP ORAL PHASE 02/04/2017 Oral Phase Impaired Oral - Pudding Teaspoon -- Oral - Pudding Cup -- Oral - Honey Teaspoon -- Oral - Honey Cup -- Oral - Nectar Teaspoon WFL Oral - Nectar Cup WFL Oral - Nectar Straw -- Oral - Thin Teaspoon -- Oral - Thin Cup WFL Oral - Thin Straw Other (Comment) Oral - Puree WFL Oral - Mech Soft -- Oral - Regular WFL Oral - Multi-Consistency -- Oral - Pill -- Oral Phase - Comment --  CHL IP PHARYNGEAL PHASE 02/04/2017 Pharyngeal Phase Impaired Pharyngeal- Pudding Teaspoon -- Pharyngeal -- Pharyngeal- Pudding Cup -- Pharyngeal -- Pharyngeal- Honey Teaspoon -- Pharyngeal -- Pharyngeal- Honey Cup -- Pharyngeal -- Pharyngeal- Nectar Teaspoon WFL Pharyngeal -- Pharyngeal- Nectar Cup Delayed swallow initiation-pyriform sinuses Pharyngeal -- Pharyngeal- Nectar Straw -- Pharyngeal -- Pharyngeal- Thin Teaspoon -- Pharyngeal -- Pharyngeal- Thin Cup Delayed swallow initiation-pyriform sinuses;WFL;Penetration/Aspiration during swallow;Reduced airway/laryngeal closure Pharyngeal Material enters airway, CONTACTS cords and then ejected out;Material enters airway, remains ABOVE vocal cords and not ejected  out;Material enters airway, CONTACTS cords and not ejected out;Material does not enter airway Pharyngeal- Thin Straw Delayed swallow initiation-pyriform sinuses;WFL;Penetration/Aspiration during swallow;Reduced airway/laryngeal closure Pharyngeal -- Pharyngeal- Puree WFL Pharyngeal -- Pharyngeal- Mechanical Soft -- Pharyngeal -- Pharyngeal- Regular WFL Pharyngeal -- Pharyngeal- Multi-consistency -- Pharyngeal -- Pharyngeal- Pill Delayed swallow initiation-pyriform sinuses;WFL;Penetration/Aspiration during swallow;Reduced airway/laryngeal closure Pharyngeal -- Pharyngeal Comment --  No flowsheet data found. No flowsheet data found. Herbie Baltimore, MA CCC-SLP [phone] Lynann Beaver 02/04/2017, 2:53 PM               Recent Labs  02/04/17 1916 02/05/17 0443  WBC 10.7* 9.4  HGB 10.6* 11.2*  HCT 33.8* 35.4*  PLT 416* 405*    Recent Labs  02/04/17 1916 02/05/17 0443  NA  --  136  K  --  3.8  CL  --  98*  GLUCOSE  --  111*  BUN  --  17  CREATININE 0.60 0.68  CALCIUM  --  9.7   CBG (last 3)   Recent Labs  02/04/17 0849 02/04/17 1158 02/04/17 1604  GLUCAP 159* 159* 129*    Wt Readings from Last 3 Encounters:  02/05/17 67.3 kg (148 lb 5.9 oz)  02/04/17 68.1 kg (150 lb 2.1 oz)  10/15/16 72.7 kg (160 lb 5 oz)    Physical Exam:  Constitutional: She is oriented to person, place, and time. She appears well-developed and well-nourished.  HENT:  Head: Normocephalic.  Eyes:  Left lid healing gauze/sutures, sclera injected, minimal drainage Right pupil round reactive to light  Neck:  Tracheostomy tube in place, PMV in place. Phonation good Cardiovascular: RRR   Respiratory: CTA B.  Good inspiratory effort.  GI: Soft. Bowel sounds are normal. She exhibits no distension. There is no tenderness.  PEG tube in place, area clean and dry  Musculoskeletal: She exhibits no edema or tenderness.  Neurological: She is alert and oriented to person, place, and time.  Motor:  LUE:  4/5 proximally, 4-/5 distally LLE: 2 to 3-/5 proximally, 4-/5 distally RUE: 4/5 proximal to distal RLE: 3 to 3+/5 proximally, 4/5 distally Sensation diminished to light touch bilateral feet to ankles and to a lesser extent finger tips -motor and sensory exam stable DTRs absent b/l LE  Skin: Skin is warm and dry.  Psychiatric: pleasant  Assessment/Plan: 1. 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 Physiatrist is providing close team supervision and 24 hour management of active medical problems listed below. Physiatrist and rehab team continue to assess barriers to discharge/monitor patient progress toward functional and medical goals.  Function:  Bathing Bathing position   Position: Wheelchair/chair at sink  Bathing parts Body parts bathed by patient: Right arm, Left arm, Chest, Abdomen, Front perineal area, Right upper leg, Left upper leg Body parts bathed by helper: Buttocks, Right lower leg, Left lower leg, Back  Bathing assist Assist Level: Touching or steadying assistance(Pt > 75%)      Upper Body Dressing/Undressing Upper body dressing   What is the patient wearing?: Bra, Pull over shirt/dress Bra - Perfomed by patient: Thread/unthread right bra strap, Thread/unthread left bra strap Bra - Perfomed by helper: Hook/unhook bra (pull down sports bra) Pull over shirt/dress - Perfomed by patient: Thread/unthread right sleeve, Thread/unthread left sleeve Pull over shirt/dress - Perfomed by helper: Put head through opening, Pull shirt over trunk        Upper body assist        Lower Body Dressing/Undressing Lower body dressing   What is the patient wearing?: Pants       Pants- Performed by helper: Thread/unthread right pants leg, Thread/unthread left pants leg, Pull pants up/down                      Lower body assist        Toileting Toileting          Toileting  assist     Transfers Chair/bed transfer Chair/bed transfer activity did not occur: Safety/medical concerns Chair/bed transfer method: Lateral scoot Chair/bed transfer assist level: Moderate assist (Pt 50 - 74%/lift or lower) Chair/bed transfer assistive device: Sliding board, Armrests     Locomotion Ambulation Ambulation activity did not occur: Safety/medical concerns         Wheelchair   Type: Manual Max wheelchair distance: 100 Assist Level: Supervision or verbal cues  Cognition Comprehension Comprehension assist level: Follows complex conversation/direction with extra time/assistive device  Expression Expression assist level: Expresses complex ideas: With extra time/assistive device  Social Interaction Social Interaction assist level: Interacts appropriately with others with medication or extra time (anti-anxiety, antidepressant).  Problem Solving Problem solving assist level: Solves basic 90% of the time/requires cueing < 10% of the time  Memory Memory assist level: Recognizes or recalls 90% of the time/requires cueing < 10% of the time   Medical Problem List and Plan: 1.  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 IVIG course completed. No current plans for plasma exchange  -continue CIR therapies 2.  DVT Prophylaxis/Anticoagulation: Subcutaneous heparin. Vascular study pending 3. Pain Management: Lidoderm patch, Duragesic patch  -pain remains controlled at present  4. Mood: Zoloft 50 mg daily, Ativan as needed 5. Neuropsych: This patient is capable of making decisions on her own behalf. 6. Skin/Wound Care: Routine skin checks 7. Fluids/Electrolytes/Nutrition: I personally reviewed the patient's labs today. All acceptable 8. Tracheostomy 01/23/2017. Changed to #6 Cuffless Portex 02/03/2017 per Dr. Titus Mould  -tolerating well  -PMV during day.   plugging trial next week? 9. Gastrostomy tube 01/26/2017 per interventional radiology. Diet advanced to regular 02/04/2017  after modified barium swallow Follow-up speech therapy 10. Atrial fibrillation/bradycardia. Cardiac rate controlled at present. Follow-up with cardiology services 11. 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 Underwent tarorhaphy 02/04/2017. Continue erythromycin ointment for now follow-up ophthalmology services as needed  -eye clean/decreased irritation and drainage 12. Hyponatremia. Resolved. Sodium 136 on 2/22 13. Abnormal CXR: Clinically improving, will consider further imaging if necessary.    LOS (Days) 2 A Madison T, MD 02/06/2017 9:12 AM

## 2017-02-06 NOTE — Progress Notes (Signed)
Physical Therapy Session Note  Patient Details  Name: Joanne Morrison MRN: [id number] Date of Birth: [birth year]  Today's Date: 02/06/2017 PT Individual Time: 1430-1530 PT Individual Time Calculation (min): 60 min   Short Term Goals: Week 1:  PT Short Term Goal 1 (Week 1): Pt will perform bed mobility from flat bed with minA PT Short Term Goal 2 (Week 1): Pt will perform sit <>stand consistent modA PT Short Term Goal 3 (Week 1): Pt will perform lateral scoot transfers minA PT Short Term Goal 4 (Week 1): Pt will initiate gait training  Skilled Therapeutic Interventions/Progress Updates: Pt received supine in bed, denies pain and agreeable to treatment. Supine>sit with HOB flat and bedrails modA from sidelying>prop on elbow; increased time d/t incoordination of LEs during repositioning. Lateral scoot transfer bed <>w/c and w/c <>mat table with minA>min guard. Seated/supine LE strengthening/coordination exercises 2x10 reps each. Provided pt ith handout for performance of LE exercises over the weekend. Husband present during session to assist pt with problem solving, attention, recall during LE exercises. Sit >supine close S on mat table, supine>sit modA as in regular bed for boosting from sidelying onto elbow/hand. Returned to bed at end of session as above. Discussed with pt again importance of increasing OOB tolerance; pt verbalizes understanding but reports she is very fatigued right now and wants to get back in bed. Remained supine in bed with all needs in reach and husband present.      Therapy Documentation Precautions:  Precautions Precautions: Fall Precaution Comments: trach Restrictions Weight Bearing Restrictions: No Pain: Pain Assessment Pain Assessment: No/denies pain   See Function Navigator for Current Functional Status.   Therapy/Group: Individual Therapy  Luberta Mutter 02/06/2017, 3:55 PM

## 2017-02-06 NOTE — Progress Notes (Signed)
Day 1 of 2: Calorie Count Note  48 hour calorie count ordered.  Diet: Regular diet with thin liquids Supplements:   Ensure Enlive po BID, each supplement provides 350 kcal and 20 grams of protein.  Breakfast: 126 kcal, 3 grams of protein Lunch: 188 kcal, 16 grams of protein Dinner: 152 kcal, 6 grams of protein Supplements: 1050 kcal, 60 grams of protein  Day 1 Total intake: 1516 kcal (89% of minimum estimated needs)  85 grams of protein (100% of estimated needs)  Estimated Nutritional Needs:  Kcal:  1700-1900 Protein:  80-90 grams Fluid:  1.7 - 1.9 L/day  Nutrition Dx:  Inadequate oral intake related to poor appetite as evidenced by meal completion < 50%; ongoing  Goal:  Patient will meet greater than or equal to 90% of their needs; meeting  Intervention:   Continue 48 hours calorie count  Continue Ensure Enlive po TID, each supplement provides 350 kcal and 20 grams of protein.  RD to follow up with day 2 calorie count results on Monday, 02/09/17.  Corrin Parker, MS, RD, LDN Pager # [phone] After hours/ weekend pager # [phone]

## 2017-02-06 NOTE — IPOC Note (Signed)
Overall Plan of Care [facility]) Patient Details Name: Joanne Morrison MRN: [id number] DOB: 1951-09-04  Admitting Diagnosis: GBS  Hospital Problems: Active Problems:   GBS (Guillain Barre syndrome) ([place])     Functional Problem List: Nursing Endurance, Medication Management, Motor, Nutrition, Pain, Perception, Sensory  PT Balance, Pain, Safety, Sensory, Endurance, Motor, Skin Integrity  OT Balance, Cognition, Endurance, Motor, Sensory  SLP    TR         Basic ADL's: OT Bathing, Dressing, Toileting     Advanced  ADL's: OT       Transfers: PT Bed Mobility, Car, Bed to Chair, Manufacturing systems engineer, Metallurgist: PT Ambulation, Emergency planning/management officer, Stairs     Additional Impairments: OT None, Fuctional Use of Upper Extremity  SLP Communication, Social Cognition, Swallowing expression Memory, Problem Solving  TR      Anticipated Outcomes Item Anticipated Outcome  Self Feeding I  Swallowing  Mod I   Basic self-care  supervision  Toileting  supervision   Bathroom Transfers supervision  Bowel/Bladder  Mod I  Transfers  S with LRAD  Locomotion  S gait, minA stairs  Communication  Mod I  Cognition  Mod I   Pain  less than 3  Safety/Judgment  mod I    Therapy Plan: PT Intensity: Minimum of 1-2 x/day ,45 to 90 minutes PT Frequency: 5 out of 7 days PT Duration Estimated Length of Stay: 21-24 days OT Intensity: Minimum of 1-2 x/day, 45 to 90 minutes OT Frequency: 5 out of 7 days OT Duration/Estimated Length of Stay: 20-21 days SLP Intensity: Minumum of 1-2 x/day, 30 to 90 minutes SLP Frequency: 3 to 5 out of 7 days SLP Duration/Estimated Length of Stay: 3 weeks        Team Interventions: Nursing Interventions Patient/Family Education, Disease Management/Prevention, Pain Management, Medication Management, Skin Care/Wound Management, Discharge Planning, Psychosocial Support  PT interventions Ambulation/gait training, Training and development officer,  Discharge planning, Community reintegration, Cognitive remediation/compensation, Disease management/prevention, DME/adaptive equipment instruction, Functional mobility training, Neuromuscular re-education, Patient/family education, Pain management, Psychosocial support, Skin care/wound management, Stair training, Splinting/orthotics, Therapeutic Activities, Therapeutic Exercise, UE/LE Coordination activities, UE/LE Strength taining/ROM, Wheelchair propulsion/positioning  OT Interventions Training and development officer, Cognitive remediation/compensation, Discharge planning, DME/adaptive equipment instruction, Functional mobility training, Neuromuscular re-education, Patient/family education, Psychosocial support, Self Care/advanced ADL retraining, Therapeutic Activities, Therapeutic Exercise, UE/LE Strength taining/ROM, UE/LE Coordination activities  SLP Interventions Cognitive remediation/compensation, Cueing hierarchy, Functional tasks, Patient/family education, Therapeutic Activities, Environmental controls, Dysphagia/aspiration precaution training, Internal/external aids, Speech/Language facilitation  TR Interventions    SW/CM Interventions Discharge Planning, Psychosocial Support, Patient/Family Education    Team Discharge Planning: Destination: PT-Home ,OT- Home , SLP-Home Projected Follow-up: PT-Home health PT, OT-  Home health OT, SLP- (TBD) Projected Equipment Needs: PT-To be determined, OT- Tub/shower bench, SLP-To be determined Equipment Details: PT- , OT-  Patient/family involved in discharge planning: PT- Patient, Family member/caregiver,  OT-Patient, Family member/caregiver, SLP-Patient, Family member/caregiver  MD ELOS: 21-24 days Medical Rehab Prognosis:  Excellent Assessment: The patient has been admitted for CIR therapies with the diagnosis of GBS. The team will be addressing functional mobility, strength, stamina, balance, safety, adaptive techniques and equipment, self-care, bowel  and bladder mgt, patient and caregiver education, NMR, orthotics, trach and pulmonary mgt, pain control, egosupport. Goals have been set at mod I to supervision for mobility, self-care. Pt is motivated. Meredith Staggers, MD, FAAPMR      See Team Conference Notes for weekly updates to the plan of  care

## 2017-02-06 NOTE — Progress Notes (Signed)
Physical Therapy Note  Patient Details  Name: Joanne Morrison MRN: [id number] Date of Birth: 03-06-1951 Today's Date: 02/06/2017    Time: 1130-1155 25 minutes  1:1 No c/o pain, pt c/o fatigue from shower earlier.  Pt agreeable to therex in bed. Pt performed 2 x 15 AAROM heel slides, SLR, hip abd, SAQ and hip add.  Pt left in bed with needs at hand.   Monya Kozakiewicz 02/06/2017, 12:50 PM

## 2017-02-07 ENCOUNTER — Inpatient Hospital Stay (HOSPITAL_COMMUNITY): Payer: BLUE CROSS/BLUE SHIELD | Admitting: Speech Pathology

## 2017-02-07 ENCOUNTER — Inpatient Hospital Stay (HOSPITAL_COMMUNITY): Payer: BLUE CROSS/BLUE SHIELD | Admitting: Physical Therapy

## 2017-02-07 ENCOUNTER — Inpatient Hospital Stay (HOSPITAL_COMMUNITY): Payer: BLUE CROSS/BLUE SHIELD | Admitting: Occupational Therapy

## 2017-02-07 NOTE — Progress Notes (Signed)
Speech Language Pathology Daily Session Note  Patient Details  Name: Joanne Morrison MRN: [id number] Date of Birth: 01-07-1951  Today's Date: 02/07/2017 SLP Individual Time: 1500-1530 SLP Individual Time Calculation (min): 30 min  Short Term Goals: Week 1: SLP Short Term Goal 1 (Week 1): Patient will donn PMSV with supervision verbal cues.  SLP Short Term Goal 2 (Week 1): Patient will consume current diet without overt s/s of aspiration and Mod I for use of swallowing compensatory strategies.  SLP Short Term Goal 3 (Week 1): Patient will utilize over-articulation to maximize intelligibility to 100% at the conversation level with supervision verbal cues.  SLP Short Term Goal 4 (Week 1): Patient will recall new, daily information with supervision verbal cues.  SLP Short Term Goal 5 (Week 1): Patient will complete complex problem solving tasks with supervision verbal cues.   Skilled Therapeutic Interventions: Skilled treatment session focused on cognition goals. SLP facilitated session by providing Min A verbal cues for semi-complex money management tasks. Pt able to complete complex card game with Min A verbal cues. Pt was independently 100% intelligible at the simple conversation level. During session, pt created list of overall STGs to help herself monitor progress and keep herself encouraged. Husband present during session. Pt was returned to room, left upright in wheelchair with husband present, call light on to request help for pt to get into bed. Continue current plan of care.      Function:    Cognition Comprehension Comprehension assist level: Understands complex 90% of the time/cues 10% of the time  Expression   Expression assist level: Expresses complex ideas: With extra time/assistive device  Social Interaction Social Interaction assist level: Interacts appropriately with others with medication or extra time (anti-anxiety, antidepressant).  Problem Solving Problem solving assist  level: Solves basic 90% of the time/requires cueing < 10% of the time  Memory Memory assist level: Recognizes or recalls 75 - 89% of the time/requires cueing 10 - 24% of the time    Pain    Therapy/Group: Individual Therapy   Elliott Quade B. Rutherford Nail, M.S., [place] 02/07/2017, 4:00 PM

## 2017-02-07 NOTE — Progress Notes (Signed)
Joanne Morrison is a 66 y.o. female 10/02/1951 [id number]  Subjective: No new complaints. No new problems. Slept well. Feeling OK. Anxious for improvement and for high intensity rehab therapies to continue  Objective: Vital signs in last 24 hours: Temp:  [97.9 F (36.6 C)-98.4 F (36.9 C)] 97.9 F (36.6 C) (02/24 0620) Pulse Rate:  [84-108] 104 (02/24 0935) Resp:  [16-19] 16 (02/24 0935) BP: (141-153)/(75-87) 153/87 (02/24 0620) SpO2:  [97 %-99 %] 97 % (02/24 0935) FiO2 (%):  [21 %] 21 % (02/24 0353) Weight:  [63 kg (138 lb 14.2 oz)] 63 kg (138 lb 14.2 oz) (02/24 0620) Weight change: -4.3 kg (-9 lb 7.7 oz) Last BM Date: 02/06/17  Intake/Output from previous day: 02/23 0701 - 02/24 0700 In: 360 [P.O.:360] Out: -   Physical Exam General: No apparent distress   In [facility] at sink - Family at side Lungs: Normal effort. Lungs clear to auscultation, no crackles or wheezes. Cardiovascular: Regular rate and rhythm, no edema Neurological: No new neurological deficits   Lab Results: BMET    Component Value Date/Time   NA 136 02/05/2017 0443   K 3.8 02/05/2017 0443   CL 98 (L) 02/05/2017 0443   CO2 28 02/05/2017 0443   GLUCOSE 111 (H) 02/05/2017 0443   GLUCOSE 106 (H) 12/29/2006 0738   BUN 17 02/05/2017 0443   CREATININE 0.68 02/05/2017 0443   CALCIUM 9.7 02/05/2017 0443   GFRNONAA >60 02/05/2017 0443   GFRAA >60 02/05/2017 0443   CBC    Component Value Date/Time   WBC 9.4 02/05/2017 0443   RBC 4.15 02/05/2017 0443   HGB 11.2 (L) 02/05/2017 0443   HCT 35.4 (L) 02/05/2017 0443   PLT 405 (H) 02/05/2017 0443   MCV 85.3 02/05/2017 0443   MCH 27.0 02/05/2017 0443   MCHC 31.6 02/05/2017 0443   RDW 17.8 (H) 02/05/2017 0443   LYMPHSABS 2.2 02/05/2017 0443   MONOABS 0.9 02/05/2017 0443   EOSABS 0.4 02/05/2017 0443   BASOSABS 0.1 02/05/2017 0443   CBG's (last 3):   Recent Labs  02/04/17 1158 02/04/17 1604  GLUCAP 159* 129*   LFT's Lab Results  Component Value Date   ALT 77 (H) 02/05/2017   AST 47 (H) 02/05/2017   ALKPHOS 78 02/05/2017   BILITOT 0.6 02/05/2017    Studies/Results: No results found.  Medications:  I have reviewed the patient's current medications. Scheduled Medications: . erythromycin  1 application Both Eyes [id number]  . feeding supplement (ENSURE ENLIVE)  237 mL Oral TID BM  . fentaNYL  50 mcg Transdermal Q72H  . heparin  5,000 Units Subcutaneous Q8H  . lidocaine  1 patch Transdermal Q24H  . LORazepam  1 mg Oral Q12H  . pantoprazole sodium  40 mg Oral Q1200  . sertraline  50 mg Oral QHS   PRN Medications: acetaminophen (TYLENOL) oral liquid 160 mg/5 mL, albuterol, docusate, ondansetron **OR** ondansetron (ZOFRAN) IV, sorbitol  Assessment/Plan: Active Problems:   Depression   GBS (Guillain Barre syndrome) (HCC)   Quadriplegia and quadriparesis (HCC)   Atrial fibrillation with RVR (HCC)   Left corneal abrasion   Length of stay, days: 3  Continue CIR and supportive care of tetraplegia related to GBS, s/p IVIG course Appreciate input from pulm s/p resp failure and optho for L corneal abrasion Pt motivated to return to indep status asap - mood stable and optimistic Med mgmt as ongoing for chronic conditions as PTA  Valerie A. Asa Lente, MD 02/07/2017, 11:35 AM

## 2017-02-07 NOTE — Progress Notes (Signed)
Physical Therapy Session Note  Patient Details  Name: Sissie Martus MRN: [id number] Date of Birth: [birth year]  Today's Date: 02/07/2017 PT Individual Time: 0800-0900 PT Individual Time Calculation (min): 60 min   Short Term Goals: Week 1:  PT Short Term Goal 1 (Week 1): Pt will perform bed mobility from flat bed with minA PT Short Term Goal 2 (Week 1): Pt will perform sit <>stand consistent modA PT Short Term Goal 3 (Week 1): Pt will perform lateral scoot transfers minA PT Short Term Goal 4 (Week 1): Pt will initiate gait training  Skilled Therapeutic Interventions/Progress Updates: Tx1: Pt presented in bed requesting to use toilet. Supine to sit with HOB elevated modA. Evan sit to stand in [place] for transfer to toilet. Returned to bed for donning of clothes. Total assist for LE clothing for time management. Performed SB transfer bed to w/c with minA cues for increased lean for head/hip relationship to facilitate transfer. Pt propelled to rehab gym, continued practice of SB transfer to mat with improved technique improving to min guard. Performed unsupported sitting activities with use of BUE. Cues for decreased upper trap activation with activities. Pt able to sit unsupported x 29min while donning pillowcases and performing bicep curls, shoulder flexion, and rows with 1lb dowel. Performed supine HS pulls, bridges on therapy ball, LTR on ball for core strengthening. Pt performed supine to sit from flat mat with modA and returned to w/c via SB in same manner. Pt propelled back to room and remained in w/c with husband present awaiting next session.   Tx2: Pt returning from toilet via [place] and NT with husband present. Pt transferred to w/c and propelled to rehab gym.  Performed sit to stand in parallel bars with PTA blocking knees for safety. Pt able to perform with minA progressing to min guard x 5. Practices standing balance/pre-gait activities in // bars with pt being able to tolerate standing  2-3 minute bouts. Performed wt shifting L/R and wt shifts forward with minA. Cues for decreased support on BUE. Pt able to take x4 steps forward/backward in parallel bars with modA. Performed Kinetron 90cm/sec for continued LE stregthening 30sec x1, 1 min x 2. Session ended with handoff to SLP with husband present throughout session.       Therapy Documentation Precautions:  Precautions Precautions: Fall Precaution Comments: trach Restrictions Weight Bearing Restrictions: No General:   Vital Signs: Therapy Vitals Pulse Rate: (!) 108 Resp: 18 Patient Position (if appropriate): Sitting Oxygen Therapy SpO2: 98 % O2 Device: Tracheostomy Collar O2 Flow Rate (L/min): 6 L/min FiO2 (%): 21 % Pain:   Mobility:   Locomotion :    Trunk/Postural Assessment :    Balance:   Exercises:   Other Treatments:     See Function Navigator for Current Functional Status.   Therapy/Group: Individual Therapy  Victorio Creeden  Emiel Kielty, PTA  02/07/2017, 12:08 PM

## 2017-02-07 NOTE — Progress Notes (Signed)
Social Work  Social Work Assessment and Plan  Patient Details  Name: Joanne Morrison MRN: [id number] Date of Birth: November 10, 1951  Today's Date: 02/07/2017  Problem List:  Patient Active Problem List   Diagnosis Date Noted  . Numbness and tingling   . S/P percutaneous endoscopic gastrostomy (PEG) tube placement ([place])   . Bradycardia   . Left corneal abrasion   . Abnormal chest x-ray   . Acute respiratory failure with hypoxia ([place])   . Guillain Barr syndrome ([place])   . Atrial fibrillation with RVR ([place])   . Dysphagia, pharyngoesophageal phase   . History of ETT   . Encounter for central line placement   . Encounter for feeding tube placement   . Weakness of right lower extremity   . SOB (shortness of breath)   . Pneumonia   . Pressure injury of skin 01/16/2017  . Acute respiratory failure ([place])   . Hyponatremia with decreased serum osmolality   . Hyponatremia   . Quadriplegia and quadriparesis ([place]) 01/14/2017  . GBS (Guillain Barre syndrome) ([place]) 01/13/2017  . Weakness 01/12/2017  . Hypokalemia 01/12/2017  . Endometriosis 12/05/2013  . Chronic low back pain 08/19/2012  . HYPERLIPIDEMIA 12/10/2010  . COLONIC POLYPS, HX OF 12/13/2009  . HYPERGLYCEMIA, FASTING 11/22/2008  . Depression 02/07/2008  . ANOSMIA 02/07/2008  . HEADACHE, CHRONIC 02/07/2008  . MITRAL VALVE PROLAPSE 10/05/2007  . OSTEOPENIA 10/05/2007   Past Medical History:  Past Medical History:  Diagnosis Date  . Anxiety   . Arthritis    back  . Chronic cystitis   . Chronic low back pain   . Depression   . Diverticulosis of colon   . Hemorrhoids   . History of colon polyps    09-24-2009  rectal hyperplastic polyp/   and 2016 non-cancerous  . History of endometriosis   . History of panic attacks   . Hyperlipidemia   . Osteopenia   . Pelvic pain   . Sensation of pressure in bladder area   . Wears glasses    Past Surgical History:  Past Surgical History:  Procedure Laterality Date  . BUNIONECTOMY  Left 2004 approx  . CARPAL TUNNEL RELEASE Bilateral right 2014;  left 2007  . COLONOSCOPY W/ POLYPECTOMY  09/24/2009  . CYSTO WITH HYDRODISTENSION [name] 10/15/2016   Procedure: CYSTOSCOPY/HYDRODISTENSION AND MARCAINE INSTILLATION;  Surgeon: Rana Snare, MD;  Location: [facility];  Service: Urology;  Laterality: [name];  . CYSTO/  HYDRODISTENTION/  INSTILLATION THERAPY  1997 approx  . HYSTEROSCOPY W/D&C  11/01/2004   POLYPECTOMY  . IR GENERIC HISTORICAL  01/26/2017   IR GASTROSTOMY TUBE MOD SED 01/26/2017 Sandi Mariscal, MD [facility]-INTERV RAD  . LAPAROSCOPIC VAGINAL HYSTERECTOMY WITH SALPINGO OOPHORECTOMY Bilateral 04/20/2012  . TUBAL LIGATION  yrs ago   Social History:  reports that she has never smoked. She has never used smokeless tobacco. She reports that she does not drink alcohol or use drugs.  Family / Support Systems Marital Status: Married How Long?: 20 yrs Patient Roles: Spouse, Parent Spouse/Significant Other: Jeslin Kenter @ (H) [phone] or ([phone] Children: pt has an adult son, Charyl Bigger, living in [place]. Other Supports: pt has a sister, Mariann Laster and a brother, Estil Daft, living locally.  Per [facility] note, it was reported that pt and sister have had a strained relationship until this hospitalization but pt reports good relationship. Anticipated Caregiver: spouse may take FMLA, sister may assist or hired help Ability/Limitations of Caregiver: spouse works 130 pm until 56 am  Caregiver Availability: 24/7 Family Dynamics: Husband very supportive and encouraging as we completed the interview.  He states that he will "absolutely" arrange 24/7 and any assistance that pt will need at home.  Pt notes good relationship with her son and between spouse and son.  Social History Preferred language: English Religion: Non-Denominational Cultural Background: NA Read: Yes Write: Yes Employment Status: Disabled Date Retired/Disabled/Unemployed: ~ 5 yrs "because of my back" Insurance underwriter Issues: None Guardian/Conservator: None - per MD, pt is capable of making decisions on her own behalf.   Abuse/Neglect Physical Abuse: Denies Verbal Abuse: Denies Sexual Abuse: Denies Exploitation of patient/patient's resources: Denies Self-Neglect: Denies  Emotional Status Pt's affect, behavior adn adjustment status: Pt lying in bed and PMSV on and completes assessment interview without difficulty.  Affect bright and she is talkative and engaged with me.  She talks about having a "long road ahead" for recovery and feels she is emotionally strong for this.  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  We discussed involving neurospychology for added support and she is very agreeable. Recent Psychosocial Issues: None Pyschiatric History: Pt on Zoloft PTA for depression.  Substance Abuse History: None  Patient / Family Perceptions, Expectations & Goals Pt/Family understanding of illness & functional limitations: Pt and husband report thay had never heard of GBS prior to this, however, husband laughs about "going straight to [employer]" to learn what he can.  They have a good, basic understanding of this viral process and likely long term recovery. Premorbid pt/family roles/activities: Pt was completely independent and active. Anticipated changes in roles/activities/participation: Pt with minimal assist goals overall and husband to assume primary caregiver role Pt/family expectations/goals: Pt states, "I hope I can walk"  [employer]: None Premorbid Home Care/DME Agencies: None Transportation available at discharge: yes Resource referrals recommended: Neuropsychology, Support group (specify)  Discharge Planning Living Arrangements: Spouse/significant other Support Systems: Spouse/significant other, Children, Other relatives,  Friends/neighbors Type of Residence: Private residence Insurance Resources: Multimedia programmer (specify) ([place]) Financial Resources: SSD, Family Support Financial Screen Referred: No Living Expenses: Higher education careers adviser Management: Spouse Does the patient have any problems obtaining your medications?: No Home Management: Pt and spouse share Patient/Family Preliminary Plans: Pt to return home with her spouse as primary caregiver and additional support from family and friends to cover 24/7 care Social Work Anticipated Follow Up Needs: HH/OP Expected length of stay: ELOS 20-23 days  Clinical Impression Very pleasant woman here following acute care for GBS and very debilitated.  She is bright and talkative with spouse at bedside and very supportive.  They both report no prior knowledge of GBS but have been trying to educate themselves.  Pt denies any significant emotional distress, however, spouse notes she had been very labile on other unit.  Will refer for neuropsychology support.  Family prepared to provide 24/7 assistance.  Will follow for support and d/c planning needs.  Giavonni Cizek 02/07/2017, 10:19 AM

## 2017-02-07 NOTE — Care Management Note (Signed)
Inpatient [facility] Individual Statement of Services  Patient Name:  Joanne Morrison  Date:  02/07/2017  Welcome to the [facility].  Our goal is to provide you with an individualized program based on your diagnosis and situation, designed to meet your specific needs.  With this comprehensive rehabilitation program, you will be expected to participate in at least 3 hours of rehabilitation therapies Monday-Friday, with modified therapy programming on the weekends.  Your rehabilitation program will include the following services:  Physical Therapy (PT), Occupational Therapy (OT), Speech Therapy (ST), 24 hour per day rehabilitation nursing, Therapeutic Recreaction (TR), Neuropsychology, Case Management (Social Worker), Rehabilitation Medicine, Nutrition Services and Pharmacy Services  Weekly team conferences will be held on Tuesdays to discuss your progress.  Your Social Worker will talk with you frequently to get your input and to update you on team discussions.  Team conferences with you and your family in attendance may also be held.  Expected length of stay: 3-4 weeks  Overall anticipated outcome: minimal assistance  Depending on your progress and recovery, your program may change. Your Social Worker will coordinate services and will keep you informed of any changes. Your Social Worker's name and contact numbers are listed  below.  The following services may also be recommended but are not provided by the [facility] will be made to provide these services after discharge if needed.  Arrangements include referral to agencies that provide these services.  Your insurance has been verified to be:  [place] Your primary doctor is:  Dr. Joylene Draft  Pertinent information will be shared with your doctor and your insurance  company.  Social Worker:  [place], [place] or ([phone]   Information discussed with and copy given to patient by: Lennart Pall, 02/07/2017, 10:22 AM

## 2017-02-08 NOTE — Progress Notes (Signed)
Joanne Morrison is a 66 y.o. female 1951/04/02 [id number]  Subjective: No new complaints. No new problems. Slept well. Feeling OK. Anxious to have trach and G tube out "if I don't need them"  Objective: Vital signs in last 24 hours: Temp:  [98.2 F (36.8 C)] 98.2 F (36.8 C) (02/25 0534) Pulse Rate:  [86-110] 90 (02/25 0942) Resp:  [15-18] 18 (02/25 0942) BP: (124)/(71) 124/71 (02/25 0534) SpO2:  [93 %-99 %] 96 % (02/25 0942) FiO2 (%):  [21 %] 21 % (02/25 0942) Weight:  [62.4 kg (137 lb 9.1 oz)] 62.4 kg (137 lb 9.1 oz) (02/25 0534) Weight change: -0.6 kg (-1 lb 5.2 oz) Last BM Date: 02/06/17  Intake/Output from previous day: 02/24 0701 - 02/25 0700 In: 720 [P.O.:720] Out: -   Physical Exam General: No apparent distress   Spouse at side Lungs: Normal effort. Lungs clear to auscultation, no crackles or wheezes. Cardiovascular: Regular rate and rhythm, no edema Neurological: No new neurological deficits   Lab Results: BMET    Component Value Date/Time   NA 136 02/05/2017 0443   K 3.8 02/05/2017 0443   CL 98 (L) 02/05/2017 0443   CO2 28 02/05/2017 0443   GLUCOSE 111 (H) 02/05/2017 0443   GLUCOSE 106 (H) 12/29/2006 0738   BUN 17 02/05/2017 0443   CREATININE 0.68 02/05/2017 0443   CALCIUM 9.7 02/05/2017 0443   GFRNONAA >60 02/05/2017 0443   GFRAA >60 02/05/2017 0443   CBC    Component Value Date/Time   WBC 9.4 02/05/2017 0443   RBC 4.15 02/05/2017 0443   HGB 11.2 (L) 02/05/2017 0443   HCT 35.4 (L) 02/05/2017 0443   PLT 405 (H) 02/05/2017 0443   MCV 85.3 02/05/2017 0443   MCH 27.0 02/05/2017 0443   MCHC 31.6 02/05/2017 0443   RDW 17.8 (H) 02/05/2017 0443   LYMPHSABS 2.2 02/05/2017 0443   MONOABS 0.9 02/05/2017 0443   EOSABS 0.4 02/05/2017 0443   BASOSABS 0.1 02/05/2017 0443   CBG's (last 3):  No results for input(s): GLUCAP in the last 72 hours. LFT's Lab Results  Component Value Date   ALT 77 (H) 02/05/2017   AST 47 (H) 02/05/2017   ALKPHOS 78  02/05/2017   BILITOT 0.6 02/05/2017    Studies/Results: No results found.  Medications:  I have reviewed the patient's current medications. Scheduled Medications: . erythromycin  1 application Both Eyes [id number]  . feeding supplement (ENSURE ENLIVE)  237 mL Oral TID BM  . fentaNYL  50 mcg Transdermal Q72H  . heparin  5,000 Units Subcutaneous Q8H  . lidocaine  1 patch Transdermal Q24H  . LORazepam  1 mg Oral Q12H  . pantoprazole sodium  40 mg Oral Q1200  . sertraline  50 mg Oral QHS   PRN Medications: acetaminophen (TYLENOL) oral liquid 160 mg/5 mL, albuterol, docusate, ondansetron **OR** ondansetron (ZOFRAN) IV, sorbitol  Assessment/Plan: Active Problems:   Depression   GBS (Guillain Barre syndrome) (HCC)   Quadriplegia and quadriparesis (HCC)   Atrial fibrillation with RVR (HCC)   Left corneal abrasion   Length of stay, days: 4  Continue IP rehab as ongoing - ?cap trach tomorrow per prior notes; spouse monitoring intake to help determine if ok to remove G tube Continue med mgmt of chronic conditions as ongoing, no changes needed Support offered and questions answered  Valerie A. Asa Lente, MD 02/08/2017, 9:46 AM

## 2017-02-08 NOTE — Progress Notes (Signed)
Occupational Therapy Session Note  Patient Details  Name: Joanne Morrison MRN: [id number] Date of Birth: June 06, 1951  Today's Date:02/07/2017 OT Individual Time: 1030-1130 OT Individual Time Calculation (min): 60 min    Short Term Goals: Week 1:  OT Short Term Goal 1 (Week 1): Pt will be able to complete a squat pivot transfer to toilet with max A of 1.  OT Short Term Goal 2 (Week 1): Pt will be able to sit to stand with mod A of 1 using UE support during LB dressing.  OT Short Term Goal 3 (Week 1): Pt will demonstrate dynamic sitting balance on tub bench with min A during bathing. OT Short Term Goal 4 (Week 1): Pt will demonstrate improved BUE AROM to pull shirt over head with min A.  Skilled Therapeutic Interventions/Progress Updates: focus of session as follows:  Already lower body dressed and resorted to UB bathing and dressing and transfers:  UB batheing min A due to fatigue and weakness moreso in left UE to wash right arm pit and wash under both breasts  UB dressing required instruction to grab neck and bottom portion of shirt and pull over head (required max assist to don sleep shirt over head with right hand).    She lacked ROM in left UE to use it to help doff/don shirt  For donning clean shirt same requirement due to decrease ROM and strength  She was able to complete oral care with moderate assistance for toothpaste container due to lack of hand and digital strength  Though she had many c/os of being fatigued and required rest breaks about 5 minutes, she was able able to transfer w/c to recliner chair via sliding board with Minimal assistance.  She required Total assist for comfort and positioning inrecliner.  She ws left with her supportive husband at the en dof the session with call bell and phone nearby.      Therapy Documentation Precautions:  Precautions Precautions: Fall Precaution Comments: trach Restrictions Weight Bearing Restrictions: No  Pain:denied     See Function Navigator for Current Functional Status.   Therapy/Group: Individual Therapy  Alfredia Ferguson [facility] 02/08/2017, 5:09 PM

## 2017-02-09 ENCOUNTER — Inpatient Hospital Stay (HOSPITAL_COMMUNITY): Payer: BLUE CROSS/BLUE SHIELD | Admitting: Physical Therapy

## 2017-02-09 ENCOUNTER — Inpatient Hospital Stay (HOSPITAL_COMMUNITY): Payer: BLUE CROSS/BLUE SHIELD | Admitting: Speech Pathology

## 2017-02-09 ENCOUNTER — Inpatient Hospital Stay (HOSPITAL_COMMUNITY): Payer: BLUE CROSS/BLUE SHIELD | Admitting: Occupational Therapy

## 2017-02-09 MED ORDER — WHITE PETROLATUM GEL
Status: AC
  Filled 2017-02-09: qty 1

## 2017-02-09 NOTE — Progress Notes (Signed)
Occupational Therapy Session Note  Patient Details  Name: Joanne Morrison MRN: [id number] Date of Birth: 05-Oct-1951  Today's Date: 02/09/2017 OT Individual Time: 1045-1200 OT Individual Time Calculation (min): 75 min    Short Term Goals: Week 1:  OT Short Term Goal 1 (Week 1): Pt will be able to complete a squat pivot transfer to toilet with max A of 1.  OT Short Term Goal 2 (Week 1): Pt will be able to sit to stand with mod A of 1 using UE support during LB dressing.  OT Short Term Goal 3 (Week 1): Pt will demonstrate dynamic sitting balance on tub bench with min A during bathing. OT Short Term Goal 4 (Week 1): Pt will demonstrate improved BUE AROM to pull shirt over head with min A.  Skilled Therapeutic Interventions/Progress Updates:    Pt seen for ADL training with a focus on sit to stand and stand pivot transfers (as opposed to using board or stedy) as pt is not stronger in her LE. Pt eager to shower. Pt received in bed. Completed stand pivot bed to w/c, w/c >< elevated toilet seat, w/c >< tub bench with mod A for both lower and lift phase of transfer.  Pt demonstrated improved endurance and BUE AROM to enable her to complete more tasks without rest and she donned shirt with set up and completed oral care with set up. Pt resting in w/c at end of session with spouse in room and all needs met.   Therapy Documentation Precautions:  Precautions Precautions: Fall Precaution Comments: trach Restrictions Weight Bearing Restrictions: No    Vital Signs: Therapy Vitals Pulse Rate: (!) 112 Resp: 18 Patient Position (if appropriate): Lying Oxygen Therapy SpO2: 99 % O2 Device: Tracheostomy Collar O2 Flow Rate (L/min): 5 L/min FiO2 (%): 21 % Pain: Pain Assessment Pain Assessment: No/denies pain ADL: ADL ADL Comments: refer to functional navigator  See Function Navigator for Current Functional Status.   Therapy/Group: Individual Therapy  Kolter Reaver 02/09/2017, 12:39 PM

## 2017-02-09 NOTE — Progress Notes (Signed)
Physical Therapy Session Note  Patient Details  Name: Joanne Morrison MRN: [id number] Date of Birth: 01-18-1951  Today's Date: 02/09/2017 PT Individual Time: 0900-1000 PT Individual Time Calculation (min): 60 min   Short Term Goals: Week 1:  PT Short Term Goal 1 (Week 1): Pt will perform bed mobility from flat bed with minA PT Short Term Goal 2 (Week 1): Pt will perform sit <>stand consistent modA PT Short Term Goal 3 (Week 1): Pt will perform lateral scoot transfers minA PT Short Term Goal 4 (Week 1): Pt will initiate gait training  Skilled Therapeutic Interventions/Progress Updates: Pt received seated in w/c, denies pain and agreeable to treatment. Transported to gym totalA for energy conservation. Sit >stand in parallel bars min guard with BUEs on rails; marching in place x20 steps total with min guard at B knees due to ataxia and buckling. Sit >stand from w/c with RW and minA, cues for hand placement on w/c and RW when initiating stand. Stand pivot transfer w/c>mat table with RW and minA at B knees and cues for backing up to table prior to sitting. Gait x22' with minA and RW; w/c follow for safety when fatigued. Second trial gait x18' with minA overall, however when fatigued pt placing feet far forward under front rail of RW and had posterior LOB, requiring totalA to safely sit on therapist's knee. +2A sit >stand to return to w/c safely for rest break. Performed ascent/descent of four 3" stairs with B handrails and min/modA; alternating RLE and LLE lead leg with increased weakness noted LLE compared to R. Ataxic foot placement when returning LEs to floor. Seated BUE coordination task manipulating small animal toys, "busy beetles" game for fine motor coordination and UE strengthening. Returned to room totalA for energy conservation. Stand pivot transfer w/c >bed minA. Sit >supine with S. Remained supine in bed at end of session, all needs in reach and husband present.      Therapy  Documentation Precautions:  Precautions Precautions: Fall Precaution Comments: trach Restrictions Weight Bearing Restrictions: No   See Function Navigator for Current Functional Status.   Therapy/Group: Individual Therapy  Luberta Mutter 02/09/2017, 9:49 AM

## 2017-02-09 NOTE — Progress Notes (Signed)
Social Work Patient ID: Joanne Morrison, female   DOB: 1951-08-14, 66 y.o.   MRN: [id number]   Met briefly with pt and spouse who report uneventful weekend.  Both very eager for trach to be d/c'd.  Have alerted Marlowe Shores, PA as Dr. Naaman Plummer is off this week and he will follow up with pulmonary service.  Continue to follow.  Helene Bernstein, LCSW

## 2017-02-09 NOTE — Progress Notes (Signed)
Day 2 of 2: Calorie Count Note  48 hour calorie count ordered.  Diet: Regular diet with thin liquids Supplements:   Ensure Enlive po TID, each supplement provides 350 kcal and 20 grams of protein.  Breakfast: 176 kcal, 3 grams of protein Lunch: 252 kcal, 12 grams of protein Dinner: 74 kcal, 6 grams of protein Supplements: 700 kcal, 40 grams of protein  Day 2 Total intake: 1202 kcal (71% of minimum estimated needs)  61 grams of protein (76% of estimated needs)  Estimated Nutritional Needs:  Kcal:  1700-1900 Protein:  80-90 grams Fluid:  1.7 - 1.9 L/day  Meal completion has been 50-80%. Pt reports appetite has been improving. Pt currently has Ensure and has been consuming most of them. RD to continue with current orders. Pt encouraged to eat her food at meals and to drink her supplements.   Nutrition Dx:  Inadequate oral intake related to poor appetite as evidenced by meal completion < 50%; ongoing  Goal:  Patient will meet greater than or equal to 90% of their needs; progressing  Intervention:   Discontinue calorie count  Continue Ensure Enlive po TID, each supplement provides 350 kcal and 20 grams of protein.   Corrin Parker, MS, RD, LDN Pager # [phone] After hours/ weekend pager # [phone]

## 2017-02-09 NOTE — Progress Notes (Signed)
Speech Language Pathology Daily Session Note  Patient Details  Name: Joanne Morrison MRN: [id number] Date of Birth: June 11, 1951  Today's Date: 02/09/2017 SLP Individual Time: 0830-0900 SLP Individual Time Calculation (min): 30 min  Short Term Goals: Week 1: SLP Short Term Goal 1 (Week 1): Patient will donn PMSV with supervision verbal cues.  SLP Short Term Goal 2 (Week 1): Patient will consume current diet without overt s/s of aspiration and Mod I for use of swallowing compensatory strategies.  SLP Short Term Goal 3 (Week 1): Patient will utilize over-articulation to maximize intelligibility to 100% at the conversation level with supervision verbal cues.  SLP Short Term Goal 4 (Week 1): Patient will recall new, daily information with supervision verbal cues.  SLP Short Term Goal 5 (Week 1): Patient will complete complex problem solving tasks with supervision verbal cues.   Skilled Therapeutic Interventions: Skilled treatment session focused on cognition goals. SLP facilitated session by providing supervision cues for completion of complex calendar management tasks. Pt was ~100% intelligible at the simple conversation level with Mod I. Pt with continued great progress with each session. Pt was returned to room, left upright in wheelchair in husband's presence and all needs within reach. Continue per current plan of care.   Function:   Cognition Comprehension Comprehension assist level: Understands complex 90% of the time/cues 10% of the time  Expression   Expression assist level: Expresses complex ideas: With extra time/assistive device  Social Interaction Social Interaction assist level: Interacts appropriately with others with medication or extra time (anti-anxiety, antidepressant).  Problem Solving Problem solving assist level: Solves basic problems with no assist  Memory Memory assist level: Recognizes or recalls 75 - 89% of the time/requires cueing 10 - 24% of the time    Pain     Therapy/Group: Individual Therapy   Jemiah Ellenburg B. Rutherford Nail, M.S., CCC-SLP Speech-Language Pathologist   [name] 02/09/2017, 11:30 AM

## 2017-02-09 NOTE — Progress Notes (Deleted)
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  No corneal staining  OS:  Temporary tarsorrhaphy in place.  Corneal epithelial defect healed!!!!  Less conjunctival injection  Still with Left facial paralysis.  2-3 mm lagophthalmos nasally  Impression:  Healed corneal abrasions OS.  +lagophthalmos OS secondary to L facial paralysis  Plan:  Continue erythromycin OS q 4 hours.  Hopefully she will regain left sided facial movement.  --Luberta Mutter [phone]

## 2017-02-09 NOTE — Progress Notes (Signed)
Occupational Therapy Session Note  Patient Details  Name: Cedra Etsitty MRN: [id number] Date of Birth: 1951-06-29  Today's Date: 02/09/2017 OT Individual Time: 1430-1500 OT Individual Time Calculation (min): 30 min    Short Term Goals: Week 1:  OT Short Term Goal 1 (Week 1): Pt will be able to complete a squat pivot transfer to toilet with max A of 1.  OT Short Term Goal 2 (Week 1): Pt will be able to sit to stand with mod A of 1 using UE support during LB dressing.  OT Short Term Goal 3 (Week 1): Pt will demonstrate dynamic sitting balance on tub bench with min A during bathing. OT Short Term Goal 4 (Week 1): Pt will demonstrate improved BUE AROM to pull shirt over head with min A.  Skilled Therapeutic Interventions/Progress Updates:    Pt seen for OT session focusing on functional transfers and standing balance/ tolerance. Pt in supine upon arrival with husband present, pt agreeable to tx session.  She transferred to EOB with VCs for technique, required min-mod A when transferring from flat bed. She completed min A stand pivot transfer to w/c with min A, VCs for hand placement on RW and reaching back to sit. She self propelled w/c to therapy gym for UE strengthening with increased time. Addressed static and dynamic standing balance/ endurance in prep for functional standing tasks. Completed static standing trial tolerating 2 min with B UE support on RW. Completed trial of standing to remove and replace clothes pins from clothes pin tree with steadying assist from therapist and one UE support. Pt returned to w/c at end of session and returned to room with assist from husband.   Therapy Documentation Precautions:  Precautions Precautions: Fall Precaution Comments: trach Restrictions Weight Bearing Restrictions: No ADL: ADL ADL Comments: refer to functional navigator  See Function Navigator for Current Functional Status.   Therapy/Group: Individual Therapy  Lewis, Hildy Nicholl  C 02/09/2017, 7:27 AM

## 2017-02-10 ENCOUNTER — Inpatient Hospital Stay (HOSPITAL_COMMUNITY): Payer: BLUE CROSS/BLUE SHIELD | Admitting: Speech Pathology

## 2017-02-10 ENCOUNTER — Inpatient Hospital Stay (HOSPITAL_COMMUNITY): Payer: BLUE CROSS/BLUE SHIELD | Admitting: Occupational Therapy

## 2017-02-10 ENCOUNTER — Inpatient Hospital Stay (HOSPITAL_COMMUNITY): Payer: BLUE CROSS/BLUE SHIELD | Admitting: Physical Therapy

## 2017-02-10 DIAGNOSIS — R5381 Other malaise: Secondary | ICD-10-CM

## 2017-02-10 DIAGNOSIS — R0902 Hypoxemia: Secondary | ICD-10-CM

## 2017-02-10 DIAGNOSIS — Z930 Tracheostomy status: Secondary | ICD-10-CM

## 2017-02-10 DIAGNOSIS — R1310 Dysphagia, unspecified: Secondary | ICD-10-CM

## 2017-02-10 DIAGNOSIS — G8250 Quadriplegia, unspecified: Secondary | ICD-10-CM

## 2017-02-10 NOTE — Progress Notes (Signed)
Occupational Therapy Session Note  Patient Details  Name: Joanne Morrison MRN: [id number] Date of Birth: 1951/02/15  Today's Date: 02/10/2017 OT Individual Time: 0945-1100 OT Individual Time Calculation (min): 75 min    Short Term Goals: Week 1:  OT Short Term Goal 1 (Week 1): Pt will be able to complete a squat pivot transfer to toilet with max A of 1.  OT Short Term Goal 2 (Week 1): Pt will be able to sit to stand with mod A of 1 using UE support during LB dressing.  OT Short Term Goal 3 (Week 1): Pt will demonstrate dynamic sitting balance on tub bench with min A during bathing. OT Short Term Goal 4 (Week 1): Pt will demonstrate improved BUE AROM to pull shirt over head with min A.  Skilled Therapeutic Interventions/Progress Updates:    Pt seen for OT session focusing on UE strengthening and LE strengthening/ coordination. Pt sitting up in w/c upon arrival, denying bathing/dressing this morning.  She self propelled w/c to therapy gym for UE strengthening/ endurance. C Throughout session completed stand pivots with min A using RW.   Completed UE/LE circuit consisting of:  2x 10 reps bicep curls 2x 10 shoulder press (assist for L UE).  Standing at [employer] and hip abduction x10 each LE with seated rest break btwn trials.   Completed standing toe taps with targets placed on floor addressing LE control/ placement. Completed alternating LEs with CGA.   Seated on EOM, completed reaching task with L UE addressing elbow extension. Assist provided at L elbow/ shoulder to eliminate compensatory movements at shoulder.  She then stood at [facility] to remove horseshoes with R UE, required to cross midline to obtain in order to address functional standing balance/ endurance and core mobility- completed with CGA.  She ambulated with RW towards room with w/c follow and CGA. VCs provided for step length and foot placement. Ambulated ~10 ft each trial before requiring seated rest break. She returned to  room remainder of way in w/c. She was left seated in w/c with all needs in reach and husband present.   Therapy Documentation Precautions:  Precautions Precautions: Fall Precaution Comments: trach Restrictions Weight Bearing Restrictions: No Pain:   No/ denies pain ADL: ADL ADL Comments: refer to functional navigator  See Function Navigator for Current Functional Status.   Therapy/Group: Individual Therapy  Lewis, Jiro Kiester C 02/10/2017, 7:11 AM

## 2017-02-10 NOTE — Progress Notes (Signed)
Speech Language Pathology Daily Session Note  Patient Details  Name: Joanne Morrison MRN: [id number] Date of Birth: 1951/11/10  Today's Date: 02/10/2017 SLP Individual Time: 0830-0930 SLP Individual Time Calculation (min): 60 min  Short Term Goals: Week 1: SLP Short Term Goal 1 (Week 1): Patient will donn PMSV with supervision verbal cues.  SLP Short Term Goal 2 (Week 1): Patient will consume current diet without overt s/s of aspiration and Mod I for use of swallowing compensatory strategies.  SLP Short Term Goal 3 (Week 1): Patient will utilize over-articulation to maximize intelligibility to 100% at the conversation level with supervision verbal cues.  SLP Short Term Goal 4 (Week 1): Patient will recall new, daily information with supervision verbal cues.  SLP Short Term Goal 5 (Week 1): Patient will complete complex problem solving tasks with supervision verbal cues.   Skilled Therapeutic Interventions: Skilled treatment session focused on cognition goals. SLP facilitated session by providing Min A faded to supervision for daily schedule task in a moderately distracting environment. Pt able to complete semi- complex novel card game with supervision. Pt continues to be intelligible at the conversation level in moderately noisy environment for ~100%. Pt was returned to room, left upright in wheelchair with husband present. Continue per current plan of care.      Function:    Cognition Comprehension Comprehension assist level: Follows complex conversation/direction with extra time/assistive device  Expression   Expression assist level: Expresses complex ideas: With extra time/assistive device  Social Interaction Social Interaction assist level: Interacts appropriately with others with medication or extra time (anti-anxiety, antidepressant).  Problem Solving Problem solving assist level: Solves complex 90% of the time/cues < 10% of the time  Memory Memory assist level: Recognizes or  recalls 90% of the time/requires cueing < 10% of the time    Pain    Therapy/Group: Individual Therapy   Dominic Rhome B. Rutherford Nail, M.S., CCC-SLP Speech-Language Pathologist   Leisha Trinkle 02/10/2017, 10:08 AM

## 2017-02-10 NOTE — Progress Notes (Signed)
Came to assess patient. Pt is in the restroom at this time. RN at bedside/entrance of bathroom door caring for patient needs.

## 2017-02-10 NOTE — Progress Notes (Signed)
PHYSICAL MEDICINE & REHABILITATION     PROGRESS NOTE    Subjective/Complaints:  Patient has been using Passy-Muir valve all day, removing only at night. Denies any shortness of breath. She is able to eat with it plugged. Tolerating therapy Wife and husband anxious to have trach removed. Appreciate trach team note.   ROS: pt denies nausea, vomiting, diarrhea, cough, shortness of breath or chest pain   Objective: Vital Signs: Blood pressure (!) 143/80, pulse 89, temperature 98.1 F (36.7 C), temperature source Oral, resp. rate 16, weight 64.4 kg (141 lb 15.6 oz), SpO2 98 %. No results found. No results for input(s): WBC, HGB, HCT, PLT in the last 72 hours. No results for input(s): NA, K, CL, GLUCOSE, BUN, CREATININE, CALCIUM in the last 72 hours.  Invalid input(s): CO CBG (last 3)  No results for input(s): GLUCAP in the last 72 hours.  Wt Readings from Last 3 Encounters:  02/10/17 64.4 kg (141 lb 15.6 oz)  02/04/17 68.1 kg (150 lb 2.1 oz)  10/15/16 72.7 kg (160 lb 5 oz)    Physical Exam:  Constitutional: She is oriented to person, place, and time. She appears well-developed and well-nourished.  HENT:  Head: Normocephalic.  Eyes:  Left lid healing gauze/sutures, sclera injected, minimal drainage Right pupil round reactive to light  Neck:  Tracheostomy tube in place, PMV in place. Phonation good Cardiovascular: RRR   Respiratory: CTA B.  Good inspiratory effort.  GI: Soft. Bowel sounds are normal. She exhibits no distension. There is no tenderness.  PEG tube in place, area clean and dry  Musculoskeletal: She exhibits no edema or tenderness.  Neurological: She is alert and oriented to person, place, and time.  Motor:  LUE: 4/5 proximally, 4-/5 distally LLE: 2 to 3-/5 proximally, 4-/5 distally RUE: 4/5 proximal to distal RLE: 3 to 3+/5 proximally, 4/5 distally Sensation diminished to light touch bilateral feet to ankles and to a lesser extent finger  tips -motor and sensory exam stable DTRs absent b/l LE  Skin: Skin is warm and dry.  Psychiatric: pleasant  Assessment/Plan: 1. 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 Physiatrist is providing close team supervision and 24 hour management of active medical problems listed below. Physiatrist and rehab team continue to assess barriers to discharge/monitor patient progress toward functional and medical goals.  Function:  Bathing Bathing position   Position: Shower  Bathing parts Body parts bathed by patient: Right arm, Left arm, Chest, Abdomen, Front perineal area, Right upper leg, Left upper leg Body parts bathed by helper: Buttocks, Right lower leg, Left lower leg, Back  Bathing assist Assist Level: Touching or steadying assistance(Pt > 75%)      Upper Body Dressing/Undressing Upper body dressing   What is the patient wearing?: Pull over shirt/dress Bra - Perfomed by patient: Thread/unthread right bra strap, Thread/unthread left bra strap Bra - Perfomed by helper: Hook/unhook bra (pull down sports bra) Pull over shirt/dress - Perfomed by patient: Thread/unthread left sleeve, Thread/unthread right sleeve, Put head through opening, Pull shirt over trunk Pull over shirt/dress - Perfomed by helper: Put head through opening, Pull shirt over trunk        Upper body assist Assist Level: Set up      Lower Body Dressing/Undressing Lower body dressing   What is the patient wearing?: Pants, Maryln Manuel, Underwear, Socks Underwear - Performed by patient: Thread/unthread right underwear leg Underwear - Performed by  helper: Thread/unthread left underwear leg, Pull underwear up/down Pants- Performed by patient: Thread/unthread right pants leg Pants- Performed by helper: Thread/unthread left pants leg, Pull pants up/down       Socks - Performed by helper: Don/doff right sock, Don/doff left sock    Shoes - Performed by helper: Don/doff right shoe, Don/doff left shoe, Fasten right, Fasten left       TED Hose - Performed by helper: Don/doff right TED hose, Don/doff left TED hose  Lower body assist Assist for lower body dressing: Touching or steadying assistance (Pt > 75%)      Toileting Toileting   Toileting steps completed by patient: Adjust clothing prior to toileting, Performs perineal hygiene Toileting steps completed by helper: Adjust clothing prior to toileting, Performs perineal hygiene, Adjust clothing after toileting Toileting Assistive Devices: Grab bar or rail  Toileting assist Assist level: Touching or steadying assistance (Pt.75%)   Transfers Chair/bed transfer Chair/bed transfer activity did not occur: Safety/medical concerns Chair/bed transfer method: Stand pivot Chair/bed transfer assist level: Touching or steadying assistance (Pt > 75%) Chair/bed transfer assistive device: Armrests, Medical sales representative Ambulation activity did not occur: Safety/medical concerns   Max distance: 22 Assist level: 2 helpers   Wheelchair   Type: Manual Max wheelchair distance: 100 Assist Level: Supervision or verbal cues  Cognition Comprehension Comprehension assist level: Follows complex conversation/direction with extra time/assistive device  Expression Expression assist level: Expresses complex ideas: With extra time/assistive device  Social Interaction Social Interaction assist level: Interacts appropriately with others with medication or extra time (anti-anxiety, antidepressant).  Problem Solving Problem solving assist level: Solves complex 90% of the time/cues < 10% of the time  Memory Memory assist level: Recognizes or recalls 90% of the time/requires cueing < 10% of the time   Medical Problem List and Plan: 1.  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 IVIG course completed. No current plans for plasma exchange  -continue CIR therapies, PT, OT,  speech, care team conference  2.  DVT Prophylaxis/Anticoagulation: Subcutaneous heparin. Vascular study pending 3. Pain Management: Lidoderm patch, Duragesic patch  -pain remains controlled at present 4. Mood: Zoloft 50 mg daily, Ativan as needed 5. Neuropsych: This patient is capable of making decisions on her own behalf. 6. Skin/Wound Care: Routine skin checks 7. Fluids/Electrolytes/Nutrition: I personally reviewed the patient's labs today. All acceptable 8. Tracheostomy 01/23/2017. Changed to #6 Cuffless Portex 02/03/2017 per Dr. Titus Mould  -tolerating well  -PMV during day.  Appreciate trach team consult, possible decannulation tomorrow 9. Gastrostomy tube 01/26/2017 per interventional radiology. Diet advanced to regular 02/04/2017 after modified barium swallow Follow-up speech therapy, would not remove Gastrostomy tube until 6 weeks post  10. Atrial fibrillation/bradycardia. Cardiac rate controlled at present. Follow-up with cardiology services 11. 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 Underwent tarorhaphy 02/04/2017. Continue erythromycin ointment for now follow-up ophthalmology services as needed  -eye clean/decreased irritation and drainage 12. Hyponatremia. Resolved. Sodium 136 on 2/22 13. Abnormal CXR: Clinically improving, will consider further imaging if necessary.    LOS (Days) 6 A FACE TO FACE EVALUATION WAS PERFORMED  Charlett Blake, MD 02/10/2017 12:40 PM

## 2017-02-10 NOTE — Procedures (Signed)
Trach capped at this time per MD order.  Pt tolerating well,  No distress noted, spo2 98%, strong voice, Rt will monitor

## 2017-02-10 NOTE — Progress Notes (Signed)
Name:              Alexx Barmann MRN:  [id number] DOB:   1951/03/10        ADMISSION DATE:  01/12/2017 CONSULTATION DATE:  01/15/2017  REFERRING MD :  PMR  CHIEF COMPLAINT:  Trach dependent since 01/23/17 and request for decannulation 2/27  HPI:  66 yo female admitted to [facility] 01/11/17 with Ezequiel Ganser and subsequently required intubation and tracheostomy and was discharged to to Rehab 2/20 with number 6 portex cuffless trach. She has been on PMV for days with out issues and her muscle  weakness is at near resolution. PCCM asked to evaluate for decannulation.   Past Medical History:  Diagnosis Date  . Anxiety   . Arthritis    back  . Chronic cystitis   . Chronic low back pain   . Depression   . Diverticulosis of colon   . Hemorrhoids   . History of colon polyps    09-24-2009  rectal hyperplastic polyp/   and 2016 non-cancerous  . History of endometriosis   . History of panic attacks   . Hyperlipidemia   . Osteopenia   . Pelvic pain   . Sensation of pressure in bladder area   . Wears glasses   . Past Surgical History:  Procedure Laterality Date  . BUNIONECTOMY Left 2004 approx  . CARPAL TUNNEL RELEASE Bilateral right 2014;  left 2007  . COLONOSCOPY W/ POLYPECTOMY  09/24/2009  . CYSTO WITH HYDRODISTENSION [name] 10/15/2016   Procedure: CYSTOSCOPY/HYDRODISTENSION AND MARCAINE INSTILLATION;  Surgeon: Rana Snare, MD;  Location: [facility];  Service: Urology;  Laterality: [name];  . CYSTO/  HYDRODISTENTION/  INSTILLATION THERAPY  1997 approx  . HYSTEROSCOPY W/D&C  11/01/2004   POLYPECTOMY  . IR GENERIC HISTORICAL  01/26/2017   IR GASTROSTOMY TUBE MOD SED 01/26/2017 Sandi Mariscal, MD [facility]  . LAPAROSCOPIC VAGINAL HYSTERECTOMY WITH SALPINGO OOPHORECTOMY Bilateral 04/20/2012  . TUBAL LIGATION  yrs ago   Family History  Problem Relation Age of Onset  . Hypertension Mother   . Dementia Mother   . Lung cancer Mother     smoker  . Osteoporosis Mother     . Heart attack Father 41  . Hypertension Brother   . Diabetes Maternal Aunt   . Cancer Maternal Uncle     RENAL  . Heart attack Maternal Grandmother 86  . Lung cancer Maternal Grandfather     Ecologist  . Pulmonary embolism Brother     ? etiology  . Anesthesia problems Neg Hx   . Stroke Neg Hx    Social History:  reports that she has never smoked. She has never used smokeless tobacco. She reports that she does not drink alcohol or use drugs. Allergies:       Allergies  Allergen Reactions  . Olanzapine Other (See Comments)    STIFF JOINTS, EXTREMITY WEAKNESS, MEMORY LOSS, LOSS OF BALANCE  . Sumatriptan Other (See Comments)    REACTION: altered mental status  . Graciella Freer D-S] Other (See Comments)    lethargic         Medications Prior to Admission  Medication Sig Dispense Refill  . benzonatate (TESSALON) 100 MG capsule Take 100 mg by mouth 3 (three) times daily as needed for cough.    Marland Kitchen buPROPion (WELLBUTRIN XL) 150 MG 24 hr tablet Take 150 mg by mouth every morning.    . diazepam (VALIUM) 10 MG tablet Take 10 mg by mouth every  12 (twelve) hours as needed for anxiety.    Marland Kitchen estradiol (VIVELLE-DOT) 0.1 MG/24HR patch Place 1 patch onto the skin 2 (two) times a week.    Marland Kitchen HYDROcodone-acetaminophen (NORCO/VICODIN) 5-325 MG tablet Take 1-2 tablets by mouth every 6 (six) hours as needed for moderate pain.     Marland Kitchen losartan (COZAAR) 25 MG tablet Take 25 mg by mouth daily.    . Promethazine-Phenyleph-Codeine (PROMETHAZINE VC/CODEINE) 6.25-5-10 MG/5ML SYRP Take 5 mLs by mouth daily as needed for cough.    . rizatriptan (MAXALT) 10 MG tablet Take 1 tablet (10 mg total) by mouth as needed. May repeat in 2 hours if needed 10 tablet 1  . zolpidem (AMBIEN) 10 MG tablet Take 10 mg by mouth at bedtime.    Marland Kitchen azithromycin (ZITHROMAX) 250 MG tablet Take 250 mg by mouth daily.    Marland Kitchen levofloxacin (LEVAQUIN) 500 MG tablet Take 500 mg by mouth daily.    .  predniSONE (DELTASONE) 10 MG tablet Take 10-40 mg by mouth daily.     Allergies  Allergen Reactions  . Olanzapine Other (See Comments)    STIFF JOINTS, EXTREMITY WEAKNESS, MEMORY LOSS, LOSS OF BALANCE  . Sumatriptan Other (See Comments)    REACTION: altered mental status  . Graciella Freer D-S] Other (See Comments)    lethargic    SUBJECTIVE:  Sitting at table playing cards. Looks great!  BP (!) 143/80 (BP Location: Left Arm)   Pulse 89   Temp 98.1 F (36.7 C) (Oral)   Resp 17   Wt 141 lb 15.6 oz (64.4 kg)   SpO2 96%   BMI 25.97 kg/m    10 point review of system taken, please see HPI for positives and negatives.     LINES/TUBES: ETT 2/1>>>2/9 Trach Hyman Bible) 2/9>>> 2/20 (changed to 6 Portex)>>>2/27capped Right IJ CVL 2/1>>>2/12  Recent Labs Lab 02/04/17 1916 02/05/17 0443  NA  --  136  K  --  3.8  CL  --  98*  CO2  --  28  BUN  --  17  CREATININE 0.60 0.68  GLUCOSE  --  111*    Recent Labs Lab 02/04/17 1916 02/05/17 0443  HGB 10.6* 11.2*  HCT 33.8* 35.4*  WBC 10.7* 9.4  PLT 416* 405*   General appearance:  66 Year old  female, well nourished, NAD, conversant via PMV Eyes: left eye has guard , moist conjunctivae; PERRL, EOMI bilaterally. Mouth:  membranes and no mucosal ulcerations; normal hard and soft palate Neck: Trachea midline; neck supple, no JVD, the trach site looks clean. She has a small pressure ulcer at 0600 below the trach. Now w/ #6 cuffless trach  Lungs/chest:CTA CV: RRR, no MRGs  Abdomen: Soft, non-tender; no masses or HSM Extremities: No peripheral edema or extremity lymphadenopathy, strong grips Skin: Normal temperature, turgor and texture; no rash, ulcers or subcutaneous nodules Psych: Appropriate affect, alert and oriented to person, place and time No results found.   ASSESSMENT / PLAN:   Tracheostomy dependent after prolonged critical illness.  Resolved acute respiratory failure d/t NM weakness. Tolerating PMV for days/ GB in  remission.  Plan:  2/27 cap trach 2/28 eval for possible decannulation. I suspect she is ready for decannulation.  Richardson Landry Minor ACNP Maryanna Shape PCCM Pager [phone] till 3 pm If no answer page [phone] 02/10/2017, 9:50 AM  Attending Note:  66 year old female with GBS that is very well known to me (had her in the ICU and I placed trach) who was  discharged from ICU to CIR with trach/peg.  Patient has weaned very well and has improved.  PCCM was called back to evaluate for decannulation.  Patient has not been capped.  On exam, moving air very well with PMV with clear lungs, left eye remains very weak and unable to close but otherwise strength is much improved.  I reviewed CXR myself, trach in good position.  Tracheostomy management:  - Cap trach including overnight.  - Check in AM, if not desaturation or excessive secretions then will decannulate in AM.  Hypoxemia:  - Titrate O2 for sat of 88-92%  - Switch O2 to nasal cannula once trach is capped  Physical deconditioning:  - Continue with rehab efforts.  Dysphagia:  - Much improved.  - ?d/c PEG  PCCM will re-evaluate in AM.  Patient seen and examined, agree with above note.  I dictated the care and orders written for this patient under my direction.  Rush Farmer, MD [phone]

## 2017-02-10 NOTE — Progress Notes (Signed)
Physical Therapy Session Note  Patient Details  Name: Joanne Morrison MRN: [id number] Date of Birth: [birth year]  Today's Date: 02/10/2017 PT Individual Time: 1400-1500 PT Individual Time Calculation (min): 60 min   Short Term Goals: Week 1:  PT Short Term Goal 1 (Week 1): Pt will perform bed mobility from flat bed with minA PT Short Term Goal 2 (Week 1): Pt will perform sit <>stand consistent modA PT Short Term Goal 3 (Week 1): Pt will perform lateral scoot transfers minA PT Short Term Goal 4 (Week 1): Pt will initiate gait training  Skilled Therapeutic Interventions/Progress Updates: Pt received seated in w/c with husband present; denies pain and agreeable to treatment. Gait x3 trials; x50', x75', and x60' with RW and minA; husband following with w/c to allow for rest breaks d/t fatigue. Sit <>stand from elevated seat height with BUE support on mat/RW and min guard, 2x10 reps. Cues for eccentric control stand>sit. Standing heel raises, toe raises 2x15 each with min guard; poor foot clearance on toe raises d/t dorsiflexor weakness. Demo to pt/husband seated theraband plantar/dorsiflexor exercises to allow pt to continue progression with LE strength with husband actively involved in exercises in room; husband demonstrated assist for RLE following demo from therapist on L. Also demo to pt and husband other ways to utilize theraband for UE/LE strengthening exercises. Standing alternating toe taps to 6" step with BUE on RW and min guard; 2x20. W/c propulsion to return to room BUE for strengthening and aerobic endurance. Remained seated in w/c at end of session, all needs in reach.      Therapy Documentation Precautions:  Precautions Precautions: Fall Precaution Comments: trach Restrictions Weight Bearing Restrictions: No   See Function Navigator for Current Functional Status.   Therapy/Group: Individual Therapy  Luberta Mutter 02/10/2017, 2:47 PM

## 2017-02-10 NOTE — Progress Notes (Signed)
Pt was in therapy when RT was able to come assess. Pt in no distress, and airway stable and secure with PMSV.

## 2017-02-10 NOTE — Progress Notes (Signed)
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  Exam:  OD:  Good lid closure, conjunctiva clear, No corneal staining  OS:  Still 3 mm nasal lagophthalmos.  Temporal tarsorrhaphy in place.  No corneal staining!!!!!  Ointment in tear film, no conjunctiva injection.  Still with left facial paralysis  Impression:   1.  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 Patient may need permanent tarsorrhaphy if facial paralysis fails to resolve.

## 2017-02-11 ENCOUNTER — Inpatient Hospital Stay (HOSPITAL_COMMUNITY): Payer: BLUE CROSS/BLUE SHIELD | Admitting: Physical Therapy

## 2017-02-11 ENCOUNTER — Encounter (HOSPITAL_COMMUNITY): Payer: BLUE CROSS/BLUE SHIELD | Admitting: Psychology

## 2017-02-11 ENCOUNTER — Inpatient Hospital Stay (HOSPITAL_COMMUNITY): Payer: BLUE CROSS/BLUE SHIELD | Admitting: Speech Pathology

## 2017-02-11 ENCOUNTER — Inpatient Hospital Stay (HOSPITAL_COMMUNITY): Payer: BLUE CROSS/BLUE SHIELD | Admitting: Occupational Therapy

## 2017-02-11 DIAGNOSIS — R0902 Hypoxemia: Secondary | ICD-10-CM

## 2017-02-11 DIAGNOSIS — F332 Major depressive disorder, recurrent severe without psychotic features: Secondary | ICD-10-CM

## 2017-02-11 DIAGNOSIS — R5381 Other malaise: Secondary | ICD-10-CM

## 2017-02-11 DIAGNOSIS — R1310 Dysphagia, unspecified: Secondary | ICD-10-CM

## 2017-02-11 DIAGNOSIS — I4891 Unspecified atrial fibrillation: Secondary | ICD-10-CM

## 2017-02-11 NOTE — Consult Note (Signed)
Neuropsychology Consultation   Patient has prior history of depression going back years.  Reports no good response to SSRI and has been treated with Wellbutrin for years.  The patient and husband describe frustrations with first ED visit when they left prior to full eval or interventions.  This focus may distract from some of therapy efforts but addressed some today with encouragement to stay focused on where we are and what needs to be done right now.  Patient describes depression and fear around where she is going to be in future.  Depression continues to be issue and agreed to seen her again when I am back on unit.  The issue of depression having an impact on therapeutic efforts and vise versa will need to be considered.  MS appears to be intact.  Language and memory appear intact.

## 2017-02-11 NOTE — Progress Notes (Signed)
Occupational Therapy Session Note  Patient Details  Name: Joanne Morrison MRN: [id number] Date of Birth: [birth year]  Today's Date: 02/11/2017 OT Individual Time: 1100-1200 and 1430-1445 OT Individual Time Calculation (min): 60 min and 15 min OT Missed Time: 55 Min (respiratory)   Short Term Goals: Week 1:  OT Short Term Goal 1 (Week 1): Pt will be able to complete a squat pivot transfer to toilet with max A of 1.  OT Short Term Goal 2 (Week 1): Pt will be able to sit to stand with mod A of 1 using UE support during LB dressing.  OT Short Term Goal 3 (Week 1): Pt will demonstrate dynamic sitting balance on tub bench with min A during bathing. OT Short Term Goal 4 (Week 1): Pt will demonstrate improved BUE AROM to pull shirt over head with min A.  Skilled Therapeutic Interventions/Progress Updates:    Session One: Pt seen for OT session focusing on ADL re-training and neuro re-ed. Pt sitting EOB upon arrival, agreeable to tx session. With encouragement for independence, pt donned shoes with SBA. She completed stand pivot transfer to w/c with CGA. Seated in w/c, pt doffed/ donned shirt mod I. In ADL apartment, pt completed simulated tub/shower transfer using tub transfer bench. Completed with supervision following VCs/ demonstration for technique. Recommended to pt and husband for removal of shower door at home and to replace with curtain for ease and safety of transfer. Pt's husband voiced that being no problem.  In therapy gym, fine motor task of pt removing shoe laces from shoes. Therapist replaced with elastic shoelaces for increased independence with LB dressing.  She completed UE ROM utilizing UE ranger with L UE, shoulder flexion/ extension x20 reps and R/L rotation x20 reps. She then completed reaching activity with L UE, required to reach down to obtain item and then cross midline to place item on higher shelf addressing UE ROM and core strengthening/ ROM. Required assist for L elbow  stabilization to prevent shoulder compensation.  Pt self propelled w/c back to room at end of session for UE strengthening, requiring one rest break. Pt returned to room and left seated in w/c with all needs in reach and husband present.  Session Two:  Pt seen for OT session focusing on ROM with L UE. Pt sitting up in w/c upon arrival with husband present. Pt voicing excitement that respiratory was scheduled to come soon to remove trach. Pt agreeable to tx session until respiratory arrived.  Completed table top towel exercises consisting of: x10 B shoulder flexion x10 L UE elbow flexion/ extension x10 L UE AB/ADduction  She then completed the following exercises against gravity unsupported. x10 L shoulder flexion (rest breaks required throughout) x10  L shoulder AB/ADduction x10 shoulder shrugs  x10 shoulder retraction  Respiratory therapist then arrived. Pt missed 15 minutes due to respiratory arriving. All needs in reach and husband present.    Therapy Documentation Precautions:  Precautions Precautions: Fall Precaution Comments: trach Restrictions Weight Bearing Restrictions: No Pain:   No/ denies pain ADL: ADL ADL Comments: refer to functional navigator  See Function Navigator for Current Functional Status.   Therapy/Group: Individual Therapy  Lewis, Juliona Vales C 02/11/2017, 7:12 AM

## 2017-02-11 NOTE — Progress Notes (Signed)
  Name:              Joanne Morrison MRN:  [id number] DOB:   1951-10-23        ADMISSION DATE:  01/12/2017 CONSULTATION DATE:  01/15/2017  REFERRING MD :  PMR  CHIEF COMPLAINT:  Trach dependent since 01/23/17 and request for decannulation 2/27  HPI:  66 yo female admitted to [facility] 01/11/17 with Ezequiel Ganser and subsequently required intubation and tracheostomy and was discharged to to Rehab 2/20 with number 6 portex cuffless trach. She has been on PMV for days with out issues and her muscle  weakness is at near resolution. PCCM asked to evaluate for decannulation.   SUBJECTIVE:  No events, no new complaints, trach capped all night.  BP 139/80 (BP Location: Left Arm)   Pulse (!) 107   Temp 98.3 F (36.8 C) (Oral)   Resp 18   Wt 66 kg (145 lb 8.1 oz)   SpO2 99%   BMI 26.61 kg/m    LINES/TUBES: ETT 2/1>>>2/9 Trach Hyman Bible) 2/9>>> 2/20 (changed to 6 Portex)>>>2/27capped>>>2/28 decannulated Right IJ CVL 2/1>>>2/12  Recent Labs Lab 02/04/17 1916 02/05/17 0443  NA  --  136  K  --  3.8  CL  --  98*  CO2  --  28  BUN  --  17  CREATININE 0.60 0.68  GLUCOSE  --  111*    Recent Labs Lab 02/04/17 1916 02/05/17 0443  HGB 10.6* 11.2*  HCT 33.8* 35.4*  WBC 10.7* 9.4  PLT 416* 405*   General appearance:  66 year old female, up in chair, PMV on, speaking Eyes: Left eye has guard, PERRL, EOM-I and MMM Mouth:  MMM, no ulceration Neck: Trachea midline, trach in good position, clean Lungs/chest: CTA bilaterally CV: RRR, Nl S1/S2, -M/R/G. Abdomen: Soft, NT,ND and +BS Extremities: -edema and -tenderness Skin: Intact Psych: Appropriate affect, alert and oriented to person, place and time  I reviewed CXR myself, trach is in good position with clear lungs  ASSESSMENT / PLAN:  66 year old female with GBS that is very well known to me (had her in the ICU and I placed trach) who was discharged from ICU to CIR with trach/peg.  Patient has weaned very well and has improved.  PCCM was  called back to evaluate for decannulation.  Patient has not been capped.  On exam, moving air very well with trach cap on with clear lungs, left eye remains very weak and unable to close with guard on but otherwise strength is much improved.  Discussed with RT.  Tracheostomy management: capped overnight with no desaturation episodes  - Decannulate  - Replace O2 to [place]  Hypoxemia:  - Titrate O2 for sat of 88-92%  - Switch O2 to nasal cannula post decannulation, hope is to progress to RA relatively quickly  Physical deconditioning:  - Continue with rehab efforts.  Dysphagia:  - Much improved.  - Recommend d/c PEG  PCCM will sign off, please call back if needed  Patient seen and examined, agree with above note.  I dictated the care and orders written for this patient under my direction.  Rush Farmer, MD [phone]

## 2017-02-11 NOTE — Progress Notes (Signed)
Physical Therapy Session Note  Patient Details  Name: Joanne Morrison MRN: [id number] Date of Birth: 07-10-1951  Today's Date: 02/11/2017 PT Individual Time: 1300-1400 PT Individual Time Calculation (min): 60 min   Short Term Goals: Week 1:  PT Short Term Goal 1 (Week 1): Pt will perform bed mobility from flat bed with minA PT Short Term Goal 2 (Week 1): Pt will perform sit <>stand consistent modA PT Short Term Goal 3 (Week 1): Pt will perform lateral scoot transfers minA PT Short Term Goal 4 (Week 1): Pt will initiate gait training  Skilled Therapeutic Interventions/Progress Updates: Pt received seated in w/c with husband present, denies pain and agreeable to treatment. W/c propulsion x100' with BUE and S for BUE strengthening. Ascent/descent 8 stairs 3" height x3 trials, minA overall with occasional assist to prevent LE buckling during descent. One trial on 6" stairs with B handrails and modA, increased assist needed for eccentric control. Standing kinetron x3 trials 1-2 min each with heavy UE reliance for balance, cues for upright posture. Standing balance with UE support while engaged in dynavision with RUE reaching for light board; min guard for balance. Second trial without UE support; minA for balance however no major LOBs or buckling noted. Returned to room w/c propulsion with S. Remained seated in w/c, all needs in reach and husband present.      Therapy Documentation Precautions:  Precautions Precautions: Fall Precaution Comments: trach Restrictions Weight Bearing Restrictions: No   See Function Navigator for Current Functional Status.   Therapy/Group: Individual Therapy  Luberta Mutter 02/11/2017, 2:05 PM

## 2017-02-11 NOTE — Progress Notes (Signed)
Speech Language Pathology Weekly Progress and Session Note  Patient Details  Name: Joanne Morrison MRN: [id number] Date of Birth: 1951/07/10  Beginning of progress report period: February 05, 2017 End of progress report period: February 11, 2017  Today's Date: 02/11/2017   Treatment session #1 SLP Individual Time: 0830-0900 SLP Individual Time Calculation (min): 30 min   Treatment session #2 SLP Individual Time: 1530-1600 SLP Individual Time Calculation (min): 30 min  Short Term Goals: Week 1: SLP Short Term Goal 1 (Week 1): Patient will donn PMSV with supervision verbal cues.  SLP Short Term Goal 1 - Progress (Week 1): Met SLP Short Term Goal 2 (Week 1): Patient will consume current diet without overt s/s of aspiration and Mod I for use of swallowing compensatory strategies.  SLP Short Term Goal 2 - Progress (Week 1): Met SLP Short Term Goal 3 (Week 1): Patient will utilize over-articulation to maximize intelligibility to 100% at the conversation level with supervision verbal cues.  SLP Short Term Goal 3 - Progress (Week 1): Met SLP Short Term Goal 4 (Week 1): Patient will recall new, daily information with supervision verbal cues.  SLP Short Term Goal 4 - Progress (Week 1): Met SLP Short Term Goal 5 (Week 1): Patient will complete complex problem solving tasks with supervision verbal cues.  SLP Short Term Goal 5 - Progress (Week 1): Progressing toward goal    New Short Term Goals: Week 2: SLP Short Term Goal 1 (Week 2): Patient will consume current diet without overt s/s of aspiration and Independent use of swallowing compensatory strategies.  SLP Short Term Goal 2 (Week 2): Patient will utilize over-articulation to maximize intelligibility to 100% at the conversation level with Mod I cues.  SLP Short Term Goal 3 (Week 2): Patient will recall new, daily information with Mod I verbal cues.  SLP Short Term Goal 4 (Week 2): Patient will complete complex problem solving tasks with  supervision verbal cues.   Weekly Progress Updates: Pt with good functional progress this reporting period as evidenced by meeting 4 of 5 STGs. Pt decannulated on 02/11/17. Pt with improvements in the areas of complex problem solving, speech intelligibility and complex problem solving as she only needs supervision to Mod I. Pt continues to require skilled ST to address deficits in novel complex problem solving tasks and speech intelligibility in the setting of moderately noisy environment.    Intensity: Minumum of 1-2 x/day, 30 to 90 minutes Frequency: 3 to 5 out of 7 days Duration/Length of Stay: 3 weeks  Treatment/Interventions: Cognitive remediation/compensation;Cueing hierarchy;Functional tasks;Patient/family education;Therapeutic Activities;Environmental controls;Dysphagia/aspiration precaution training;Internal/external aids;Speech/Language facilitation   Daily Session  Skilled Therapeutic Interventions:  Treatment Session #1: Skilled treatment session focused on cognition goals. SLP facilitated session by providing Min A to supervision cues for novel complex medication management task. Pt was returned to room, left upright in wheelchair with husband present.    Treatment Session #2: Skilled treatment session focused on cognition goals. SLP facilitated session by providing Min A verbal cues faded to supervision cues for ~85% accuracy when answering [facility] questions following auditory comprehension of paragraph. Pt was decannulated prior to session and instructions given about finger placement to aid in occlusion of stoma while healing. Pt was left in bed with husband present and all needs within reach. Continue per current plan of care.    Function:    Cognition Comprehension Comprehension assist level: Follows complex conversation/direction with extra time/assistive device  Expression   Expression assist level: Expresses complex ideas: With  extra time/assistive device  Social Interaction  Social Interaction assist level: Interacts appropriately with others with medication or extra time (anti-anxiety, antidepressant).  Problem Solving Problem solving assist level: Solves complex problems: With extra time  Memory Memory assist level: Recognizes or recalls 90% of the time/requires cueing < 10% of the time   General     Pain    Therapy/Group: Individual Therapy  Gradyn Shein B. Rutherford Nail, M.S., CCC-SLP Speech-Language Pathologist  Dickie Labarre 02/11/2017, 2:10 PM

## 2017-02-11 NOTE — Progress Notes (Addendum)
[place] PHYSICAL MEDICINE & REHABILITATION     PROGRESS NOTE    Subjective/Complaints:  Joanne Morrison is plugged tolerating this well. Tolerating therapy Wife and husband anxious to have trach removed. Appreciate trach team note. While the patient is no longer using gastrostomy tube for feeding, it has only been placed about 2 weeks ago  ROS: pt denies nausea, vomiting, diarrhea, cough, shortness of breath or chest pain   Objective: Vital Signs: Blood pressure 136/82, pulse 85, temperature 97.6 F (36.4 C), temperature source Oral, resp. rate 20, weight 66 kg (145 lb 8.1 oz), SpO2 95 %. No results found. No results for input(s): WBC, HGB, HCT, PLT in the last 72 hours. No results for input(s): NA, K, CL, GLUCOSE, BUN, CREATININE, CALCIUM in the last 72 hours.  Invalid input(s): CO CBG (last 3)  No results for input(s): GLUCAP in the last 72 hours.  Wt Readings from Last 3 Encounters:  02/11/17 66 kg (145 lb 8.1 oz)  02/04/17 68.1 kg (150 lb 2.1 oz)  10/15/16 72.7 kg (160 lb 5 oz)    Physical Exam:  Constitutional: She is oriented to person, place, and time. She appears well-developed and well-nourished.  HENT:  Head: Normocephalic.  Eyes:  Left lid healing gauze/sutures, sclera injected, minimal drainage Right pupil round reactive to light  Neck:  Tracheostomy tube in place, PMV in place. Phonation good Cardiovascular: RRR   Respiratory: CTA B.  Good inspiratory effort.  GI: Soft. Bowel sounds are normal. She exhibits no distension. There is no tenderness.  PEG tube in place, area clean and dry  Musculoskeletal: She exhibits no edema or tenderness.  Neurological: She is alert and oriented to person, place, and time.  Motor:  LUE: 3-/5 proximally, 4-/5 distally LLE:  3-/5 proximally, 4-/5 distally RUE: 4-/5 proximal to 4/5 distal RLE: 2- to 3+/5 proximally, 4/5 distally Sensation diminished to light touch bilateral feet to ankles and to a lesser extent finger  tips -motor and sensory exam stable DTRs absent b/l LE  Skin: Skin is warm and dry.  Psychiatric: pleasant  Assessment/Plan: 1. 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 Physiatrist is providing close team supervision and 24 hour management of active medical problems listed below. Physiatrist and rehab team continue to assess barriers to discharge/monitor patient progress toward functional and medical goals.  Function:  Bathing Bathing position   Position: Shower  Bathing parts Body parts bathed by patient: Right arm, Left arm, Chest, Abdomen, Front perineal area, Right upper leg, Left upper leg Body parts bathed by helper: Buttocks, Right lower leg, Left lower leg, Back  Bathing assist Assist Level: Touching or steadying assistance(Pt > 75%)      Upper Body Dressing/Undressing Upper body dressing   What is the patient wearing?: Pull over shirt/dress Bra - Perfomed by patient: Thread/unthread right bra strap, Thread/unthread left bra strap Bra - Perfomed by helper: Hook/unhook bra (pull down sports bra) Pull over shirt/dress - Perfomed by patient: Thread/unthread left sleeve, Thread/unthread right sleeve, Put head through opening, Pull shirt over trunk Pull over shirt/dress - Perfomed by helper: Put head through opening, Pull shirt over trunk        Upper body assist Assist Level: Set up      Lower Body Dressing/Undressing Lower body dressing   What is the patient wearing?: Pants, Maryln Manuel, Underwear, Socks Underwear - Performed by patient: Thread/unthread right underwear leg Underwear - Performed by helper:  Thread/unthread left underwear leg, Pull underwear up/down Pants- Performed by patient: Thread/unthread right pants leg Pants- Performed by helper: Thread/unthread left pants leg, Pull pants up/down       Socks - Performed by helper: Don/doff right sock, Don/doff left sock    Shoes - Performed by helper: Don/doff right shoe, Don/doff left shoe, Fasten right, Fasten left       TED Hose - Performed by helper: Don/doff right TED hose, Don/doff left TED hose  Lower body assist Assist for lower body dressing: Touching or steadying assistance (Pt > 75%)      Toileting Toileting   Toileting steps completed by patient: Adjust clothing prior to toileting, Performs perineal hygiene Toileting steps completed by helper: Adjust clothing prior to toileting, Performs perineal hygiene, Adjust clothing after toileting Toileting Assistive Devices: Grab bar or rail  Toileting assist Assist level: Touching or steadying assistance (Pt.75%)   Transfers Chair/bed transfer Chair/bed transfer activity did not occur: Safety/medical concerns Chair/bed transfer method: Stand pivot Chair/bed transfer assist level: Touching or steadying assistance (Pt > 75%) Chair/bed transfer assistive device: Armrests, Medical sales representative Ambulation activity did not occur: Safety/medical concerns   Max distance: 75 Assist level: Touching or steadying assistance (Pt > 75%)   Wheelchair   Type: Manual Max wheelchair distance: 100 Assist Level: Supervision or verbal cues  Cognition Comprehension Comprehension assist level: Follows complex conversation/direction with extra time/assistive device  Expression Expression assist level: Expresses complex ideas: With extra time/assistive device  Social Interaction Social Interaction assist level: Interacts appropriately with others with medication or extra time (anti-anxiety, antidepressant).  Problem Solving Problem solving assist level: Solves complex 90% of the time/cues < 10% of the time  Memory Memory assist level: Recognizes or recalls 90% of the time/requires cueing < 10% of the time   Medical Problem List and Plan: 1.  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 IVIG course completed. No current plans for plasma  exchange  -continue CIR therapies, PT, OT, speech,Still with significant weakness, left side greater than right side 2.  DVT Prophylaxis/Anticoagulation: Subcutaneous heparin. Vascular study pending 3. Pain Management: Lidoderm patch, Duragesic patch  -pain remains controlled at present 4. Mood: Zoloft 50 mg daily, Ativan as needed 5. Neuropsych: This patient is capable of making decisions on her own behalf. 6. Skin/Wound Care: Routine skin checks 7. Fluids/Electrolytes/Nutrition: I personally reviewed the patient's labs today. All acceptable 8. Tracheostomy 01/23/2017. Changed to #6 Cuffless Portex 02/03/2017 per Dr. Titus Mould  -tolerating well  -PMV during day.  Appreciate trach team consult, possible decannulation today 9. Gastrostomy tube 01/26/2017 per interventional radiology. Diet advanced to regular 02/04/2017 after modified barium swallow Follow-up speech therapy,   would not remove Gastrostomy tube until 03/09/2017 10. Atrial fibrillation/bradycardia. Cardiac rate controlled at present. Follow-up with cardiology services Vitals:   02/10/17 1700 02/11/17 0520  BP: 140/71 136/82  Pulse:  85  Resp:  20  Temp: 98.1 F (36.7 C) 97.6 F (36.4 C)   11. 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 Underwent tarorhaphy 02/04/2017. Continue erythromycin ointment for now follow-up ophthalmology services as needed  -eye clean/decreased irritation and drainage     LOS (Days) 7 A FACE TO FACE EVALUATION WAS PERFORMED  Charlett Blake, MD 02/11/2017 12:06 PM

## 2017-02-11 NOTE — Progress Notes (Signed)
Pt is capped. No distress noted. SATs are stable 98% on Room Air. Pt states no questions for me at this time.

## 2017-02-11 NOTE — Progress Notes (Signed)
Patient not in room for 12p trach check.  

## 2017-02-11 NOTE — Progress Notes (Signed)
Pt in therapy. Checked on patient and pt stable and in no distress.

## 2017-02-12 ENCOUNTER — Inpatient Hospital Stay (HOSPITAL_COMMUNITY): Payer: BLUE CROSS/BLUE SHIELD | Admitting: Occupational Therapy

## 2017-02-12 ENCOUNTER — Inpatient Hospital Stay (HOSPITAL_COMMUNITY): Payer: BLUE CROSS/BLUE SHIELD | Admitting: Physical Therapy

## 2017-02-12 ENCOUNTER — Inpatient Hospital Stay (HOSPITAL_COMMUNITY): Payer: BLUE CROSS/BLUE SHIELD | Admitting: Speech Pathology

## 2017-02-12 ENCOUNTER — Encounter: Payer: Self-pay | Admitting: Ophthalmology

## 2017-02-12 MED ORDER — ENSURE ENLIVE PO LIQD
237.0000 mL | Freq: Every day | ORAL | Status: DC
  Administered 2017-02-17 – 2017-02-19 (×2): 237 mL via ORAL

## 2017-02-12 NOTE — Progress Notes (Signed)
02/12/17 1416 nursing Patient c/o that the red object to keep eyelids closed  on her left eyelids seems like sitting right on her eyeball. Dan PA notified and said that opthalmologist has been notified and aware of the situation.

## 2017-02-12 NOTE — Progress Notes (Signed)
Social Work Patient ID: Joanne Morrison, female   DOB: 15-Apr-1951, 66 y.o.   MRN: [id number]   Met with pt and spouse yesterday to review team conference.  Both aware and agreeable with targeted d/c date of 3/14 with supervision goals overall (some min).  Pleased with current progress and hopeful trach will be removed soon (done yesterday afternoon).  Pt remains very motivated and hopes "... That I'll be ready when you say I will."  Continue to follow.  Tine Mabee, LCSW

## 2017-02-12 NOTE — Progress Notes (Signed)
02/12/17 0825 nursing Therapist came to RN stating the red "thing" on patients rt eyelids " looks like it is not in place". RN notified Pam PA which notified the opthalmologist. Per PA to lubricate eye with her eye ointment while waiting for the eye doctor. No complaints of pain  or discomfort at this time. We'll continue to monitor.

## 2017-02-12 NOTE — Progress Notes (Signed)
Nutrition Follow-up  DOCUMENTATION CODES:   Not applicable  INTERVENTION:  Provide Ensure Enlive po once daily, each supplement provides 350 kcal and 20 grams of protein.  Encourage adequate PO intake.   NUTRITION DIAGNOSIS:   Inadequate oral intake related to poor appetite as evidenced by meal completion < 50%; improved  GOAL:   Patient will meet greater than or equal to 90% of their needs; met  MONITOR:   PO intake, Supplement acceptance, Labs, Weight trends, Skin, I & O's  REASON FOR ASSESSMENT:   Consult Calorie Count  ASSESSMENT:   66 y.o. right handed female with history of Chronic cystitis, hypertension, depression chronic low back pain. Patient to be areflexic of the lower extremities suspect GBS. On 01/15/2017 develop increasing weakness and difficulty with secretions pulmonary care medicine consulted for deterioration in respiratory status. She was intubated with ventilatory support. Prolonged intubation underwent tracheostomy 01/23/2017 per Dr. Nelda Marseille as well as placement of gastrostomy tube 01/26/2017 per interventional radiology and  barium swallow 02/04/2017 with diet advanced to regular.  Decannulated 2/28.   Meal completion has been 100%. Intake has been adequate. Pt has Ensure ordered and has been refusing them recently. RD to modify orders to once daily as intake has improved. Will continue to monitor for needs.   Diet Order:  Diet regular Room service appropriate? Yes; Fluid consistency: Thin  Skin:  Reviewed, no issues  Last BM:  3/1  Height:   Ht Readings from Last 1 Encounters:  01/15/17 _0  (1.575 m)    Weight:   Wt Readings from Last 1 Encounters:  02/11/17 145 lb 8.1 oz (66 kg)    Ideal Body Weight:  50 kg  BMI:  Body mass index is 26.61 kg/m.  Estimated Nutritional Needs:   Kcal:  1700-1900  Protein:  80-90 grams  Fluid:  1.7 - 1.9 L/day  EDUCATION NEEDS:   No education needs identified at this time  Corrin Parker, MS,  RD, LDN Pager # [phone] After hours/ weekend pager # [phone]

## 2017-02-12 NOTE — Patient Care Conference (Signed)
Inpatient RehabilitationTeam Conference and Plan of Care Update Date: 02/10/17   Time: 11:15 AM    Patient Name: Joanne Morrison      Medical Record Number: [id number]  Date of Birth: Apr 17, 1951 Sex: Female         Room/Bed: 4W09C/4W09C-01 Payor Info: Payor: MEDICARE / Plan: MEDICARE PART A / Product Type: *No Product type* /    Admitting Diagnosis: GBS  Admit Date/Time:  02/04/2017  4:41 PM Admission Comments: No comment available   Primary Diagnosis:  <principal problem not specified> Principal Problem: <principal problem not specified>  Patient Active Problem List   Diagnosis Date Noted  . Physical deconditioning   . Tracheostomy status ([place])   . Hypoxemia   . Dysphagia   . Numbness and tingling   . S/P percutaneous endoscopic gastrostomy (PEG) tube placement ([place])   . Bradycardia   . Left corneal abrasion   . Abnormal chest x-ray   . Acute respiratory failure with hypoxia ([place])   . Guillain Barr syndrome ([place])   . Atrial fibrillation with RVR ([place])   . Dysphagia, pharyngoesophageal phase   . History of ETT   . Encounter for central line placement   . Encounter for feeding tube placement   . Weakness of right lower extremity   . SOB (shortness of breath)   . Pneumonia   . Pressure injury of skin 01/16/2017  . Acute respiratory failure ([place])   . Hyponatremia with decreased serum osmolality   . Hyponatremia   . Quadriplegia and quadriparesis ([place]) 01/14/2017  . GBS (Guillain Barre syndrome) ([place]) 01/13/2017  . Weakness 01/12/2017  . Hypokalemia 01/12/2017  . Endometriosis 12/05/2013  . Chronic low back pain 08/19/2012  . HYPERLIPIDEMIA 12/10/2010  . COLONIC POLYPS, HX OF 12/13/2009  . HYPERGLYCEMIA, FASTING 11/22/2008  . Depression 02/07/2008  . ANOSMIA 02/07/2008  . HEADACHE, CHRONIC 02/07/2008  . MITRAL VALVE PROLAPSE 10/05/2007  . OSTEOPENIA 10/05/2007    Expected Discharge Date: Expected Discharge Date: 02/25/17  Team Members Present: Physician leading  conference: Dr. 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  Improve mobility, safety, transfers, decannulate  See above   Bowel/Bladder    Continent of bowel and bladder LBM  02-08-14  Maintain regular bowel pattern  Monitor bowel pattern q shift and prn   Swallow/Nutrition/ Hydration   Intermittent supervision  Mod I  consuming regular diet textures without overt s/s of aspiration   ADL's   set up UB self care, mod to max with LB self care, mod A stand pivot transfers  Supervision overall  ADL retraining, general strengthening, coordination ex, pt/family educ   Mobility   S bed mobility, modA transfers and gait with RW x20', modA stairs  modI transfers, S overall, minA stairs  activity tolerance, coordination, BUE/BLE NMR, transfer and gait training   Communication   Mod I simple conversation  Mod I simple conversation  use of compensatory speech intelligibility strategies at the simple conversation level   Safety/Cognition/ Behavioral Observations  Min A to supervision with complex problem solving  Mod I  complex problem solving, use of compensatory memory strategies   Pain   mid back pain managed with lidocaine patch  pain< or =4   Assess pain q shift and prn   Skin   c/d/I  no new skin issues   monitor skin q shift and prn    Rehab Goals Patient on target to meet rehab goals: Yes *See Care Plan and progress notes for long and short-term goals.  Barriers to Discharge: Mobility, safety, transfers, anxiety, pain, trach, PEG    Possible Resolutions to Barriers:  passed swallow, therapies, neuropsych, plan to plug trach    Discharge  Planning/Teaching Needs:  Pt to d/c home with spouse to provide and arrange 24/7 assistance.      Team Discussion:  Plugging trach today;  resp status much improved and hope to d/c trach by end of week.  Already on reg diet with thin liquids but too early post-op to remove peg tube.  Min assist overall with transfers and mod assit steps. Progressing quickly with overall supervision goals.  Good cognitive gains as well.  Revisions to Treatment Plan:  None   Continued Need for Acute Rehabilitation Level of Care: The patient requires daily medical management by a physician with specialized training in physical medicine and rehabilitation for the following conditions: Daily direction of a multidisciplinary physical rehabilitation program to ensure safe treatment while eliciting the highest outcome that is of practical value to the patient.: Yes Daily medical management of patient stability for increased activity during participation in an intensive rehabilitation regime.: Yes Daily analysis of laboratory values and/or radiology reports with any subsequent need for medication adjustment of medical intervention for : Pulmonary problems;Neurological problems;Other  Ronzell Laban 02/12/2017, 9:25 AM

## 2017-02-12 NOTE — Progress Notes (Signed)
Occupational Therapy Weekly Progress Note  Patient Details  Name: Joanne Morrison MRN: [id number] Date of Birth: 1951-12-06  Beginning of progress report period: February 05, 2017 End of progress report period: February 12, 2017  Today's Date: 02/12/2017 OT Individual Time: 1300-1400 OT Individual Time Calculation (min): 60 min    Patient has met 4 of 4 short term goals.  Pt making excellent progress towards OT goals. She cont to be limited by decreased standing balance and impaired use of UEs, L>R, however, is making gains each day as well as finding modified techniques to complete ADL tasks. Pt's husband has been present and active during tx sessions, taking part in education and hands on assist.  Pt will cont to benefit from skilled therapy to increase independence with functional transfers, mobility, and UE function for ADL/IADL tasks.   Patient continues to demonstrate the following deficits: acute pain, cognitive deficits and muscle weakness (generalized) and therefore will continue to benefit from skilled OT intervention to enhance overall performance with BADL, iADL and Reduce care partner burden.  Patient progressing toward long term goals..  Continue plan of care.  OT Short Term Goals Week 1:  OT Short Term Goal 1 (Week 1): Pt will be able to complete a squat pivot transfer to toilet with max A of 1.  OT Short Term Goal 1 - Progress (Week 1): Met OT Short Term Goal 2 (Week 1): Pt will be able to sit to stand with mod A of 1 using UE support during LB dressing.  OT Short Term Goal 2 - Progress (Week 1): Met OT Short Term Goal 3 (Week 1): Pt will demonstrate dynamic sitting balance on tub bench with min A during bathing. OT Short Term Goal 3 - Progress (Week 1): Met OT Short Term Goal 4 (Week 1): Pt will demonstrate improved BUE AROM to pull shirt over head with min A. OT Short Term Goal 4 - Progress (Week 1): Met Week 2:  OT Short Term Goal 1 (Week 2): STG=LTG due to LOS  Skilled  Therapeutic Interventions/Progress Updates:    Pt seen for OT ADL bathing/dressing session. Pt sitting up upon arrival, agreeable to tx session. Throughout session she completed stand pivot transfers with guarding assist and VCs to reach back and hand placement on RW. Completed shower activity in ADL apartment utilizing tub/shower combination and tub transfer bench in simulation of home environment. VCs and guarding assist for transfer into/ out of shower. She bathed seated on tub bench, lateral leans for buttock hygiene.  She returned to chair to complete dressing activity, guarding assist for standing dressing tasks due to decreased functional standing balance.  She returned to room at end of session, brushed hair seated at sink alternating use of L UE and R UE. Encouraged pt to utilize L UE during functional tasks to cont working on strengthening and ROM. Pt left seated in w/c at end of session, all needs in reach and husband present. Pt's husband present throughout session and assisted as needed. Have checked pt's husband off to assist with stand pivot transfers to toilet and bed- safety plan updated.   Therapy Documentation Precautions:  Precautions Precautions: Fall Precaution Comments: trach Restrictions Weight Bearing Restrictions: No Pain:   No/ denies pain ADL: ADL ADL Comments: refer to functional navigator  See Function Navigator for Current Functional Status.   Therapy/Group: Individual Therapy  Lewis, Cullin Dishman C 02/12/2017, 7:20 AM

## 2017-02-12 NOTE — Progress Notes (Signed)
Speech Language Pathology Daily Session Note  Patient Details  Name: Joanne Morrison MRN: [id number] Date of Birth: 1951/12/02  Today's Date: 02/12/2017 SLP Individual Time: 1000-1100 SLP Individual Time Calculation (min): 60 min  Short Term Goals: Week 2: SLP Short Term Goal 1 (Week 2): Patient will consume current diet without overt s/s of aspiration and Independent use of swallowing compensatory strategies.  SLP Short Term Goal 2 (Week 2): Patient will utilize over-articulation to maximize intelligibility to 100% at the conversation level in a moderately noisy environment with Mod I cues.  SLP Short Term Goal 3 (Week 2): Patient will recall new, daily information with Mod I verbal cues.  SLP Short Term Goal 4 (Week 2): Patient will complete complex problem solving tasks with supervision verbal cues.   Skilled Therapeutic Interventions: Skilled treatment session focused on dysphagia and cognition goals. SLP facilitated session by providing skilled observation of pt consuming regular diet with thin liquids via straw. Pt has met dysphagia goals. Pt is also able to communicate at the conversation level in moderately noisy environment with Mod I. Pt has met that goal as well. SLP further facilitated session by providing supervision during pt demonstration of complex card game. Pt required Min A to supervision for completion of complex deductive reasoning puzzle. Pt was returned to room, left upright in wheelchair with husband present. Continue current plan of care.      Function:  Eating Eating   Modified Consistency Diet: No Eating Assist Level: Swallowing techniques: self managed;Set up assist for   Eating Set Up Assist For: Opening containers       Cognition Comprehension Comprehension assist level: Follows complex conversation/direction with extra time/assistive device  Expression   Expression assist level: Expresses complex ideas: With extra time/assistive device  Social Interaction  Social Interaction assist level: Interacts appropriately with others with medication or extra time (anti-anxiety, antidepressant).  Problem Solving Problem solving assist level: Solves complex problems: With extra time  Memory Memory assist level: Recognizes or recalls 90% of the time/requires cueing < 10% of the time    Pain    Therapy/Group: Individual Therapy   Pearley Millington B. Rutherford Nail, M.S., [place] 02/12/2017, 12:11 PM

## 2017-02-12 NOTE — Progress Notes (Signed)
[facility] PHYSICAL MEDICINE & REHABILITATION     PROGRESS NOTE    Subjective/Complaints:  Decannulated yesterday. Does not appreciate air leak through tracheostomy site. Did well overnight and is currently walking with physical therapist. No significant shortness of breath.  ROS: pt denies nausea, vomiting, diarrhea, cough, shortness of breath or chest pain   Objective: Vital Signs: Blood pressure (!) 154/64, pulse 83, temperature 98.2 F (36.8 C), temperature source Oral, resp. rate 18, weight 66 kg (145 lb 8.1 oz), SpO2 95 %. No results found. No results for input(s): WBC, HGB, HCT, PLT in the last 72 hours. No results for input(s): NA, K, CL, GLUCOSE, BUN, CREATININE, CALCIUM in the last 72 hours.  Invalid input(s): CO CBG (last 3)  No results for input(s): GLUCAP in the last 72 hours.  Wt Readings from Last 3 Encounters:  02/11/17 66 kg (145 lb 8.1 oz)  02/04/17 68.1 kg (150 lb 2.1 oz)  10/15/16 72.7 kg (160 lb 5 oz)    Physical Exam:  Constitutional: She is oriented to person, place, and time. She appears well-developed and well-nourished.  HENT:  Head: Normocephalic.  Eyes:  Left lid healing gauze/sutures, sclera injected, minimal drainage Right pupil round reactive to light  Neck:  Bandage over her tracheostomy site Phonation good Cardiovascular: RRR   Respiratory: CTA B.  No evidence of respiratory distress GI: Soft. Bowel sounds are normal. She exhibits no distension. There is no tenderness.  PEG tube in place, area clean and dry  Musculoskeletal: She exhibits no edema or tenderness.  Neurological: She is alert and oriented to person, place, and time.  Motor:  LUE: 3-/5 proximally, 4-/5 distally LLE:  3-/5 proximally, 4-/5 distally RUE: 4-/5 proximal to 4/5 distal RLE: 3+/5 proximally, 4/5 distally Sensation diminished to light touch bilateral feet to ankles and to a lesser extent finger tips  DTRs absent b/l LE  Skin: Skin is warm and dry.   Psychiatric: pleasant  Assessment/Plan: 1. 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 Physiatrist is providing close team supervision and 24 hour management of active medical problems listed below. Physiatrist and rehab team continue to assess barriers to discharge/monitor patient progress toward functional and medical goals.  Function:  Bathing Bathing position   Position: Shower  Bathing parts Body parts bathed by patient: Right arm, Left arm, Chest, Abdomen, Front perineal area, Right upper leg, Left upper leg Body parts bathed by helper: Buttocks, Right lower leg, Left lower leg, Back  Bathing assist Assist Level: Touching or steadying assistance(Pt > 75%)      Upper Body Dressing/Undressing Upper body dressing   What is the patient wearing?: Pull over shirt/dress Bra - Perfomed by patient: Thread/unthread right bra strap, Thread/unthread left bra strap Bra - Perfomed by helper: Hook/unhook bra (pull down sports bra) Pull over shirt/dress - Perfomed by patient: Thread/unthread left sleeve, Thread/unthread right sleeve, Put head through opening, Pull shirt over trunk Pull over shirt/dress - Perfomed by helper: Put head through opening, Pull shirt over trunk        Upper body assist Assist Level: Set up      Lower Body Dressing/Undressing Lower body dressing   What is the patient wearing?: Pants, [employer], Underwear, Socks Underwear - Performed by patient: Thread/unthread right underwear leg Underwear - Performed by helper: Thread/unthread left underwear leg, Pull underwear up/down Pants- Performed by patient: Thread/unthread right pants leg Pants- Performed by helper: Thread/unthread left  pants leg, Pull pants up/down       Socks - Performed by helper: Don/doff right sock, Don/doff left sock   Shoes - Performed by helper: Don/doff right shoe, Don/doff left shoe, Fasten right,  Fasten left       TED Hose - Performed by helper: Don/doff right TED hose, Don/doff left TED hose  Lower body assist Assist for lower body dressing: Touching or steadying assistance (Pt > 75%)      Toileting Toileting   Toileting steps completed by patient: Adjust clothing prior to toileting, Performs perineal hygiene Toileting steps completed by helper: Adjust clothing prior to toileting, Performs perineal hygiene, Adjust clothing after toileting Toileting Assistive Devices: Grab bar or rail  Toileting assist Assist level: Touching or steadying assistance (Pt.75%)   Transfers Chair/bed transfer Chair/bed transfer activity did not occur: Safety/medical concerns Chair/bed transfer method: Stand pivot Chair/bed transfer assist level: Touching or steadying assistance (Pt > 75%) Chair/bed transfer assistive device: Armrests, Medical sales representative Ambulation activity did not occur: Safety/medical concerns   Max distance: 167 Assist level: Touching or steadying assistance (Pt > 75%)   Wheelchair   Type: Manual Max wheelchair distance: 100 Assist Level: Supervision or verbal cues  Cognition Comprehension Comprehension assist level: Follows complex conversation/direction with extra time/assistive device  Expression Expression assist level: Expresses complex ideas: With extra time/assistive device  Social Interaction Social Interaction assist level: Interacts appropriately with others with medication or extra time (anti-anxiety, antidepressant).  Problem Solving Problem solving assist level: Solves complex problems: With extra time  Memory Memory assist level: Recognizes or recalls 90% of the time/requires cueing < 10% of the time   Medical Problem List and Plan: 1.  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 IVIG course completed. No current plans for plasma exchange  -continue CIR therapies, PT, OT, speech,Still with significant weakness, left side greater than  right side, Proximal greater than distal 2.  DVT Prophylaxis/Anticoagulation: Subcutaneous heparin. Vascular study pending 3. Pain Management: Lidoderm patch, Duragesic patch  -pain remains controlled at present 4. Mood: Zoloft 50 mg daily, Ativan as needed 5. Neuropsych: This patient is capable of making decisions on her own behalf. 6. Skin/Wound Care: Routine skin checks 7. Fluids/Electrolytes/Nutrition: I personally reviewed the patient's labs today. All acceptable 8. Tracheostomy 01/23/2017. Discontinued on 02/11/2017 9. Gastrostomy tube 01/26/2017 per interventional radiology. Diet advanced to regular 02/04/2017 after modified barium swallow Follow-up speech therapy,   would not remove Gastrostomy tube until 03/09/2017 10. Atrial fibrillation/bradycardia. Cardiac rate controlled at present. Follow-up with cardiology services Vitals:   02/11/17 1452 02/12/17 0417  BP:  (!) 154/64  Pulse: 94 83  Resp: 18 18  Temp:  98.2 F (36.8 C)   11. 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 Underwent tarorhaphy 02/04/2017. Continue erythromycin ointment for now follow-up ophthalmology services as needed, Contacted ophthalmology advised continued eyedrops.  -eye clean/decreased irritation and drainage     LOS (Days) 8 A FACE TO FACE EVALUATION WAS PERFORMED  Charlett Blake, MD 02/12/2017 10:36 AM

## 2017-02-12 NOTE — Progress Notes (Signed)
Physical Therapy Weekly Progress Note  Patient Details  Name: Joanne Morrison MRN: [id number] Date of Birth: Nov 18, 1951  Beginning of progress report period: February 05, 2017 End of progress report period: February 12, 2017  Today's Date: 02/12/2017 PT Individual Time: 0800-0900 PT Individual Time Calculation (min): 60 min   Patient has met 4 of 4 short term goals.  Pt currently requires minA overall, modA for stairs. Demonstrating consistent improvements strength and coordination in LEs. Pt is motivated to improve mobility and independence, and husband is very supportive.  Patient continues to demonstrate the following deficits muscle weakness, decreased cardiorespiratoy endurance, abnormal tone, unbalanced muscle activation, ataxia and decreased coordination and decreased standing balance, decreased postural control and decreased balance strategies and therefore will continue to benefit from skilled PT intervention to increase functional independence with mobility.  Patient progressing toward long term goals..  Continue plan of care.  PT Short Term Goals Week 1:  PT Short Term Goal 1 (Week 1): Pt will perform bed mobility from flat bed with minA PT Short Term Goal 1 - Progress (Week 1): Met PT Short Term Goal 2 (Week 1): Pt will perform sit <>stand consistent modA PT Short Term Goal 2 - Progress (Week 1): Met PT Short Term Goal 3 (Week 1): Pt will perform lateral scoot transfers minA PT Short Term Goal 3 - Progress (Week 1): Met PT Short Term Goal 4 (Week 1): Pt will initiate gait training PT Short Term Goal 4 - Progress (Week 1): Met Week 2:  PT Short Term Goal 1 (Week 2): Pt will perform bed mobility with S from flat bed without hospital bed features PT Short Term Goal 2 (Week 2): Pt will ambulate 100' minA with LRAD PT Short Term Goal 3 (Week 2): Pt will ascend/descent four 6" stairs with 1 handrail and modA PT Short Term Goal 4 (Week 2): Pt will demonstrate dynamic standing balance x5  min with close S  Skilled Therapeutic Interventions/Progress Updates: Pt received seated in w/c completing oral care; denies pain and agreeable to treatment. While pt finishing grooming, discussed home setup with pt's husband who returned home measurement sheet today. Husband's biggest concern is high bed height and reports it would be hard to rearrange the bed to make it lower. Pt ambulated x80', x167' with RW and min guard; seated rest breaks between trials due to fatigue. Gait again at end of session x150' and x75', min guard. Ambulatory transfer in ADL apartment with RW and min guard to bed at 28"; bed mobility performed with S. Discussed with husband possibility of getting a platform built to help get up into high bed at home. Gait on ramp and curb with RW and min guard. Remained seated in w/c at end of session, husband present and all needs in reach.      Therapy Documentation Precautions:  Precautions Precautions: Fall Precaution Comments: trach Restrictions Weight Bearing Restrictions: No   See Function Navigator for Current Functional Status.  Therapy/Group: Individual Therapy  Luberta Mutter 02/12/2017, 9:02 AM

## 2017-02-12 NOTE — Progress Notes (Signed)
Occupational Therapy Session Note  Patient Details  Name: Joanne Morrison MRN: [id number] Date of Birth: 1951-03-03  Today's Date: 02/12/2017 OT Individual Time: 1100-1130 OT Individual Time Calculation (min): 30 min    Short Term Goals: Week 1:  OT Short Term Goal 1 (Week 1): Pt will be able to complete a squat pivot transfer to toilet with max A of 1.  OT Short Term Goal 2 (Week 1): Pt will be able to sit to stand with mod A of 1 using UE support during LB dressing.  OT Short Term Goal 3 (Week 1): Pt will demonstrate dynamic sitting balance on tub bench with min A during bathing. OT Short Term Goal 4 (Week 1): Pt will demonstrate improved BUE AROM to pull shirt over head with min A. Week 2:     Skilled Therapeutic Interventions/Progress Updates: Therapeutic exercise with focus on proximal bilateral UE strengthening and coordination against gravity with and without objects with promotion of normal patterns of movement (with cues for decr abnormal compensatory movements).  Perform strengthening with different activities including funcitonal reach, sustained mm contractions away from trunk, and ROM with slight resistance.   Husband present for session.   Therapy Documentation Precautions:  Precautions Precautions: Fall Precaution Comments: trach Restrictions Weight Bearing Restrictions: No Pain:  numbness discomfort in fingers and fatigue- allowed to rest prn See Function Navigator for Current Functional Status.   Therapy/Group: Individual Therapy  [facility] 02/12/2017, 1:51 PM

## 2017-02-12 NOTE — Progress Notes (Signed)
Progress note ophthalmology  (I am not well trained in [place] and may have created this note in the wrong place)  I cannot figure out how to erase it.  Please see the other note I wrote

## 2017-02-13 ENCOUNTER — Inpatient Hospital Stay (HOSPITAL_COMMUNITY): Payer: BLUE CROSS/BLUE SHIELD | Admitting: Occupational Therapy

## 2017-02-13 ENCOUNTER — Inpatient Hospital Stay (HOSPITAL_COMMUNITY): Payer: BLUE CROSS/BLUE SHIELD

## 2017-02-13 ENCOUNTER — Inpatient Hospital Stay (HOSPITAL_COMMUNITY): Payer: BLUE CROSS/BLUE SHIELD | Admitting: Speech Pathology

## 2017-02-13 ENCOUNTER — Inpatient Hospital Stay (HOSPITAL_COMMUNITY): Payer: BLUE CROSS/BLUE SHIELD | Admitting: Physical Therapy

## 2017-02-13 NOTE — Progress Notes (Signed)
Occupational Therapy Session Note  Patient Details  Name: Joanne Morrison MRN: [id number] Date of Birth: August 04, 1951  Today's Date: 02/13/2017 OT Individual Time: 1430-1500 OT Individual Time Calculation (min): 30 min    Short Term Goals: Week 1:  OT Short Term Goal 1 (Week 1): Pt will be able to complete a squat pivot transfer to toilet with max A of 1.  OT Short Term Goal 1 - Progress (Week 1): Met OT Short Term Goal 2 (Week 1): Pt will be able to sit to stand with mod A of 1 using UE support during LB dressing.  OT Short Term Goal 2 - Progress (Week 1): Met OT Short Term Goal 3 (Week 1): Pt will demonstrate dynamic sitting balance on tub bench with min A during bathing. OT Short Term Goal 3 - Progress (Week 1): Met OT Short Term Goal 4 (Week 1): Pt will demonstrate improved BUE AROM to pull shirt over head with min A. OT Short Term Goal 4 - Progress (Week 1): Met Week 2:  OT Short Term Goal 1 (Week 2): STG=LTG due to LOS  Skilled Therapeutic Interventions/Progress Updates:    Pt stated she was very tired, but willing to do some light exercise. Sat to EOB and worked with UE Ranger on LUE for controlled, smooth movement patterns to enhance L side coordination for 10 min at various arm angles with sh flexion and arm circles.  Then resisted sh flexion for 12 reps. Sensory desensitization for B hands with hands in bucket of dried beans, soft toothbrush on finger tips, and soft washcloth. Pt tolerated well so increase stimulation to rough side of velcro and hard plastic comb. Pt able to touch those objects for short periods of time.  Provided pt with super soft tan putty. She practiced hand gripping, finger pinching, rolling with putty. Encouraged her to use that several times a day for at least a few minutes at a time.  Pt in room with spouse with all needs met.    Therapy Documentation Precautions:  Precautions Precautions: Fall Precaution Comments: trach Restrictions Weight Bearing  Restrictions: No   Pain: no c/o pain   ADL: ADL ADL Comments: refer to functional navigator  See Function Navigator for Current Functional Status.   Therapy/Group: Individual Therapy  [place] 02/13/2017, 3:24 PM

## 2017-02-13 NOTE — Progress Notes (Signed)
Physical Therapy Session Note  Patient Details  Name: Joanne Morrison MRN: [id number] Date of Birth: 1951/04/21  Today's Date: 02/13/2017 PT Individual Time: 1300-1400 PT Individual Time Calculation (min): 60 min   Short Term Goals: Week 2:  PT Short Term Goal 1 (Week 2): Pt will perform bed mobility with S from flat bed without hospital bed features PT Short Term Goal 2 (Week 2): Pt will ambulate 100' minA with LRAD PT Short Term Goal 3 (Week 2): Pt will ascend/descent four 6" stairs with 1 handrail and modA PT Short Term Goal 4 (Week 2): Pt will demonstrate dynamic standing balance x5 min with close S  Skilled Therapeutic Interventions/Progress Updates: Pt received seated in w/c, denies pain and agreeable to treatment. Pt dons shoes with setupA while seated in w/c. Gait to/from gym with RW and min guard x200' each trial. Ascent/descent four 6" stairs with min guard ascent, maxA for last step descent d/t poor eccentric control. Step-ups on 3" step 2x40 reps alternating lead LE for strengthening, aerobic endurance. Pt declines attempting with only one UE support. Standing balance with toe taps to numbered targets, no UE support and minA overall. Quadruped alternating UE/LE lifts for core/proximal extremity stability. Semi-reclined sit ups with ball toss 2x10 reps. Returned to room with gait as above; remained seated in w/c at end of session, all needs in reach.      Therapy Documentation Precautions:  Precautions Precautions: Fall Precaution Comments: trach Restrictions Weight Bearing Restrictions: No   See Function Navigator for Current Functional Status.   Therapy/Group: Individual Therapy  Luberta Mutter 02/13/2017, 1:59 PM

## 2017-02-13 NOTE — Progress Notes (Signed)
Occupational Therapy Session Note  Patient Details  Name: Joanne Morrison MRN: [id number] Date of Birth: 06-08-1951  Today's Date: 02/13/2017 OT Individual Time: 1030-1100 OT Individual Time Calculation (min): 30 min   Short Term Goals: Week 2:  OT Short Term Goal 1 (Week 2): STG=LTG due to LOS  Skilled Therapeutic Interventions/Progress Updates: Therapeutic activity with focus on functional mobility using RW, bed transfers with bed elevated to 31" as simulated home environment, toilet transfer, and discharge planning.   Pt received seated in w/c with husband in room.  Pt reports change in d/c plan to earlier date and was receptive to perform mobility and transfers simulating home environment: bed raised and 3:1 removed from over toilet.   With setup to place RW, pt ambulated to bed with contact guard, completed transfer and bed mobility with only supervision assist.   Pt/Husband may consider use of stepper at bed to assist as needed but pt completed transfer w/o problem.   Pt then ambulated to bathroom and completed transfer on/off low toilet with supervision, verbal cues for technique.  Pt is aware of LE weakness and continues to improve gait and endurance which may result in upgrade to LRAD (cane?) however supervision is recommended d/t limited visual field.     Therapy Documentation Precautions:  Precautions Precautions: Fall Precaution Comments: trach Restrictions Weight Bearing Restrictions: No  Pain: No/denies pain  ADL: ADL ADL Comments: refer to functional navigator  See Function Navigator for Current Functional Status.   Therapy/Group: Individual Therapy  Eliya Bubar 02/13/2017, 12:45 PM

## 2017-02-13 NOTE — Progress Notes (Signed)
Speech Language Pathology Daily Session Note  Patient Details  Name: Joanne Morrison MRN: [id number] Date of Birth: 1951/04/25  Today's Date: 02/13/2017 SLP Individual Time: 1130-1208 SLP Individual Time Calculation (min): 38 min  Short Term Goals: Week 2: SLP Short Term Goal 1 (Week 2): Patient will consume current diet without overt s/s of aspiration and Independent use of swallowing compensatory strategies.  SLP Short Term Goal 2 (Week 2): Patient will utilize over-articulation to maximize intelligibility to 100% at the conversation level in a moderately noisy environment with Mod I cues.  SLP Short Term Goal 3 (Week 2): Patient will recall new, daily information with Mod I verbal cues.  SLP Short Term Goal 4 (Week 2): Patient will complete complex problem solving tasks with supervision verbal cues.   Skilled Therapeutic Interventions: Skilled treatment session focused on cognition goals. SLP facilitated session by providing Min a verbal cues for completion of deductive reasoning puzzle. SLP also provided supervision during complex card game with increased number of players. Pt was returned to room, transferred to bed and left with husband. Continue per current plan of care.      Function:    Cognition Comprehension Comprehension assist level: Follows complex conversation/direction with extra time/assistive device  Expression   Expression assist level: Expresses complex ideas: With no assist  Social Interaction Social Interaction assist level: Interacts appropriately with others with medication or extra time (anti-anxiety, antidepressant).  Problem Solving Problem solving assist level: Solves complex problems: With extra time  Memory Memory assist level: More than reasonable amount of time    Pain    Therapy/Group: Individual Therapy   Sailor Hevia B. Rutherford Nail, M.S., [place] 02/13/2017, 12:16 PM

## 2017-02-13 NOTE — Progress Notes (Signed)
[facility] PHYSICAL MEDICINE & REHABILITATION     PROGRESS NOTE    Subjective/Complaints:  Decannulated yesterday. Does not appreciate air leak through tracheostomy site. Did well overnight and is currently walking with physical therapist. No significant shortness of breath.  ROS: pt denies nausea, vomiting, diarrhea, cough, shortness of breath or chest pain   Objective: Vital Signs: Blood pressure 126/74, pulse 82, temperature 98 F (36.7 C), temperature source Oral, resp. rate 18, weight 66 kg (145 lb 8.1 oz), SpO2 96 %. No results found. No results for input(s): WBC, HGB, HCT, PLT in the last 72 hours. No results for input(s): NA, K, CL, GLUCOSE, BUN, CREATININE, CALCIUM in the last 72 hours.  Invalid input(s): CO CBG (last 3)  No results for input(s): GLUCAP in the last 72 hours.  Wt Readings from Last 3 Encounters:  02/11/17 66 kg (145 lb 8.1 oz)  02/04/17 68.1 kg (150 lb 2.1 oz)  10/15/16 72.7 kg (160 lb 5 oz)    Physical Exam:  Constitutional: She is oriented to person, place, and time. She appears well-developed and well-nourished.  HENT:  Head: Normocephalic.  Eyes:  Left lid healing gauze/sutures, sclera injected, minimal drainage Right pupil round reactive to light  Neck:  Bandage over her tracheostomy site Phonation good Cardiovascular: RRR   Respiratory: CTA B.  No evidence of respiratory distress GI: Soft. Bowel sounds are normal. She exhibits no distension. There is no tenderness.  PEG tube in place, area clean and dry  Musculoskeletal: She exhibits no edema or tenderness.  Neurological: She is alert and oriented to person, place, and time.  Motor:  LUE: 3-/5 proximally, 4-/5 distally LLE:  3-/5 proximally, 4-/5 distally RUE: 4-/5 proximal to 4/5 distal RLE: 3+/5 proximally, 4/5 distally Sensation diminished to light touch bilateral feet to ankles and to a lesser extent finger tips  DTRs absent b/l LE  Skin: Skin is warm and dry.  Psychiatric:  pleasant  Assessment/Plan: 1. 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 Physiatrist is providing close team supervision and 24 hour management of active medical problems listed below. Physiatrist and rehab team continue to assess barriers to discharge/monitor patient progress toward functional and medical goals.  Function:  Bathing Bathing position   Position: Shower  Bathing parts Body parts bathed by patient: Right arm, Left arm, Chest, Abdomen, Front perineal area, Right upper leg, Left upper leg, Buttocks, Right lower leg, Left lower leg Body parts bathed by helper: Back  Bathing assist Assist Level: Touching or steadying assistance(Pt > 75%)      Upper Body Dressing/Undressing Upper body dressing   What is the patient wearing?: Pull over shirt/dress Bra - Perfomed by patient: Thread/unthread right bra strap, Thread/unthread left bra strap Bra - Perfomed by helper: Hook/unhook bra (pull down sports bra) Pull over shirt/dress - Perfomed by patient: Thread/unthread left sleeve, Thread/unthread right sleeve, Put head through opening, Pull shirt over trunk Pull over shirt/dress - Perfomed by helper: Put head through opening, Pull shirt over trunk        Upper body assist Assist Level: Set up   Set up : To obtain clothing/put away  Lower Body Dressing/Undressing Lower body dressing   What is the patient wearing?: Pants, [employer], Underwear, Socks, Non-skid slipper socks Underwear - Performed by patient: Thread/unthread right underwear leg, Thread/unthread left underwear leg, Pull underwear up/down Underwear - Performed by helper: Thread/unthread left underwear leg, Pull underwear up/down  Pants- Performed by patient: Thread/unthread right pants leg, Thread/unthread left pants leg, Pull pants up/down Pants- Performed by helper: Thread/unthread left pants leg, Pull pants up/down Non-skid  slipper socks- Performed by patient: Don/doff right sock, Don/doff left sock     Socks - Performed by helper: Don/doff right sock, Don/doff left sock   Shoes - Performed by helper: Don/doff right shoe, Don/doff left shoe, Fasten right, Fasten left       TED Hose - Performed by helper: Don/doff right TED hose, Don/doff left TED hose  Lower body assist Assist for lower body dressing: Touching or steadying assistance (Pt > 75%)      Toileting Toileting   Toileting steps completed by patient: Adjust clothing prior to toileting, Performs perineal hygiene Toileting steps completed by helper: Adjust clothing prior to toileting, Performs perineal hygiene, Adjust clothing after toileting Toileting Assistive Devices: Grab bar or rail  Toileting assist Assist level: Set up/obtain supplies, Supervision or verbal cues   Transfers Chair/bed transfer Chair/bed transfer activity did not occur: Safety/medical concerns Chair/bed transfer method: Stand pivot Chair/bed transfer assist level: Touching or steadying assistance (Pt > 75%) Chair/bed transfer assistive device: Armrests, Medical sales representative Ambulation activity did not occur: Safety/medical concerns   Max distance: 167 Assist level: Touching or steadying assistance (Pt > 75%)   Wheelchair   Type: Manual Max wheelchair distance: 100 Assist Level: Supervision or verbal cues  Cognition Comprehension Comprehension assist level: Follows complex conversation/direction with extra time/assistive device  Expression Expression assist level: Expresses complex ideas: With no assist  Social Interaction Social Interaction assist level: Interacts appropriately with others with medication or extra time (anti-anxiety, antidepressant).  Problem Solving Problem solving assist level: Solves complex problems: With extra time  Memory Memory assist level: More than reasonable amount of time   Medical Problem List and Plan: 1.  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 IVIG course completed. No current plans for plasma exchange  -continue CIR therapies, PT, OT, speech,2.  DVT Prophylaxis/Anticoagulation: Subcutaneous heparin. Vascular study pending 3. Pain Management: Lidoderm patch, Duragesic patch  -pain remains controlled at present 4. Mood: Zoloft 50 mg daily, Ativan as needed 5. Neuropsych: This patient is capable of making decisions on her own behalf. 6. Skin/Wound Care: Routine skin checks 7. Fluids/Electrolytes/Nutrition: I personally reviewed the patient's labs today. All acceptable 8. Tracheostomy 01/23/2017. Discontinued on 02/11/2017 9. Gastrostomy tube 01/26/2017 per interventional radiology. Diet advanced to regular 02/04/2017 after modified barium swallow Follow-up speech therapy,   would not remove Gastrostomy tube until 03/09/2017 10. Atrial fibrillation/bradycardia. Cardiac rate controlled at present. Follow-up with cardiology services Vitals:   02/12/17 1453 02/13/17 0410  BP: 139/70 126/74  Pulse: 89 82  Resp: 18 18  Temp: 98.3 F (36.8 C) 98 F (36.7 C)   11. 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 Underwent tarorhaphy 02/04/2017. Continue erythromycin ointment for now follow-up ophthalmology services as needed, Contacted ophthalmology advised continued eyedrops.  -eye clean/decreased irritation and drainage     LOS (Days) 9 A FACE TO FACE EVALUATION WAS PERFORMED  Charlett Blake, MD 02/13/2017 1:41 PM

## 2017-02-13 NOTE — Progress Notes (Signed)
Occupational Therapy Session Note  Patient Details  Name: Joanne Morrison MRN: [id number] Date of Birth: May 14, 1951  Today's Date: 02/13/2017 OT Individual Time: 0830-0930 OT Individual Time Calculation (min): 60 min    Short Term Goals: Week 2:  OT Short Term Goal 1 (Week 2): STG=LTG due to LOS  Skilled Therapeutic Interventions/Progress Updates:    Pt seen for OT session focusing on functional mobility, activity tolreance, and UE strengthening/ ROM. Pt sitting up in w/c upon arrival, agreeable to tx session. She ambulated to therapy gym with guarding assist, 2 standing rest breaks required.  Seated in w/c, she donned pillow cases and then stood to wash table top focusing on shoulder flexion/ extension and ROM. Seated on EOM, she completed reaching task focusing on core mobility/ strengthening and UE ROM with grasping abilities. Pt required to reach towards floor on R to obtain clothes pin and then cross midline and reach upwards to L to place on clothes pin tree. Manual assist provided by therapist to facilitate use of elbow extension and prevent shoulder compensation. Completed in reciprocal manner with R UE without assist. She then completed 5 min NuSTep level 4 using B UEs/LEs for strengthening and endurance. She ambulated back to room with guarding assist. Required one seated rest break. Educated regarding importance of taking rest breaks as needed to reduce risk of falls. Pt left seated in w/c at end of session with husband present.   Therapy Documentation Precautions:  Precautions Precautions: Fall Precaution Comments: trach Restrictions Weight Bearing Restrictions: No Pain:   No/ denies pain ADL: ADL ADL Comments: refer to functional navigator  See Function Navigator for Current Functional Status.   Therapy/Group: Individual Therapy  Lewis, Gracelee Stemmler C 02/13/2017, 7:01 AM

## 2017-02-13 NOTE — Procedures (Signed)
Nurses called me because temporary tarsorrhaphy became loose.  Exam:  Loose tarsorrhaphy with paralytic lagophthalmos.  Cornea clear and without staining  Plan:  1.  Current tarsorrhaphy removed with wescott scissors and forceps.  2.  New temporary tarsorrhaphy placed:   Betadine prep and sterile drape. 2% lidocaine injected into left upper and lower lids.  Double armed 5-0 silk suture and red rubber cannula used to create tarsorrhaphy.  Patient tolerated procedure well and without complications.  Erythromycin ophthalmic ointment applied.    Plan: continue the ointment q 4 hours OS

## 2017-02-14 ENCOUNTER — Inpatient Hospital Stay (HOSPITAL_COMMUNITY): Payer: BLUE CROSS/BLUE SHIELD | Admitting: *Deleted

## 2017-02-14 DIAGNOSIS — S0502XS Injury of conjunctiva and corneal abrasion without foreign body, left eye, sequela: Secondary | ICD-10-CM

## 2017-02-14 NOTE — Progress Notes (Signed)
Physical Therapy Session Note  Patient Details  Name: Joanne Morrison MRN: [id number] Date of Birth: 10/17/1951  Today's Date: 02/14/2017 PT Individual Time: 3716-9678 PT Individual Time Calculation (min): 45 min    Skilled Therapeutic Interventions/Progress Updates:  Patient in room ,sitting in w/c, agrees to therapy intervention, no pain reported. Session initiate with gait training to therapy gym with RW with Min guard and occasional cues for path following.  kinetron training in standing for weight shifting and strength development with min guard, 3 x 45 seconds.  Sit to stand 2 x 5 w/o UE support with min A, verbal and tactile cues for posture on initial standing, mainly reduction of backward leaning.  Core strengthening with 6 lbs ball for oblique turns and 4.4 lbs of bend down and reach up.  Static and dynamic standing w/o support min A reduced to guard with repetition. Mini squats 2 x 4 with min guard and manual locking of knees to prevent buckling.  Patient was able to ambulate back to room with min guard.  Left in w/c with husband present and all needs met.  Therapy Documentation Precautions:  Precautions Precautions: Fall Precaution Comments: trach Restrictions Weight Bearing Restrictions: No   See Function Navigator for Current Functional Status.   Therapy/Group: Individual Therapy  [name] Spanish 02/14/2017, 3:37 PM

## 2017-02-14 NOTE — Progress Notes (Signed)
PHYSICAL MEDICINE & REHABILITATION     PROGRESS NOTE    Subjective/Complaints: Pt seen laying in bed this AM.  She slept well overnight.  She believes she is improving.   ROS: Denies nausea, vomiting, diarrhea, cough, shortness of breath or chest pain   Objective: Vital Signs: Blood pressure 136/78, pulse 84, temperature 98.1 F (36.7 C), temperature source Oral, resp. rate 16, weight 72 kg (158 lb 11.7 oz), SpO2 98 %. No results found. No results for input(s): WBC, HGB, HCT, PLT in the last 72 hours. No results for input(s): NA, K, CL, GLUCOSE, BUN, CREATININE, CALCIUM in the last 72 hours.  Invalid input(s): CO CBG (last 3)  No results for input(s): GLUCAP in the last 72 hours.  Wt Readings from Last 3 Encounters:  02/14/17 72 kg (158 lb 11.7 oz)  02/04/17 68.1 kg (150 lb 2.1 oz)  10/15/16 72.7 kg (160 lb 5 oz)    Physical Exam:  Constitutional: She appears well-developed and well-nourished. NAD.  HENT: Normocephalic. Atraumatic.  Eyes: Left lid healing gauze/sutures Neck: Bandage over her tracheostomy site Phonation good Cardiovascular: RRR. No JVD.  Respiratory: CTA B. Unlabored.  GI: Soft. Bowel sounds are normal. +PEG Musculoskeletal: She exhibits no edema or tenderness.  Neurological: She is alert and oriented.  Motor:  LUE: 3-/5 proximally, 4-/5 distally LLE:  4+/5 proximally, 4+/5 distally RUE: 4-/5 proximal to 4/5 distal RLE: 4+/5 proximally, 4+/5 distally Skin: Skin is warm and dry. See above.  Psychiatric: Normal mood and behavior.   Assessment/Plan: 1. 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 Physiatrist is providing close team supervision and 24 hour management of active medical problems listed below. Physiatrist and rehab team continue to assess barriers to discharge/monitor patient progress toward functional and medical  goals.  Function:  Bathing Bathing position   Position: Shower  Bathing parts Body parts bathed by patient: Right arm, Left arm, Chest, Abdomen, Front perineal area, Right upper leg, Left upper leg, Buttocks, Right lower leg, Left lower leg Body parts bathed by helper: Back  Bathing assist Assist Level: Touching or steadying assistance(Pt > 75%)      Upper Body Dressing/Undressing Upper body dressing   What is the patient wearing?: Pull over shirt/dress Bra - Perfomed by patient: Thread/unthread right bra strap, Thread/unthread left bra strap Bra - Perfomed by helper: Hook/unhook bra (pull down sports bra) Pull over shirt/dress - Perfomed by patient: Thread/unthread left sleeve, Thread/unthread right sleeve, Put head through opening, Pull shirt over trunk Pull over shirt/dress - Perfomed by helper: Put head through opening, Pull shirt over trunk        Upper body assist Assist Level: Set up   Set up : To obtain clothing/put away  Lower Body Dressing/Undressing Lower body dressing   What is the patient wearing?: Pants, [employer], Underwear, Socks, Non-skid slipper socks Underwear - Performed by patient: Thread/unthread right underwear leg, Thread/unthread left underwear leg, Pull underwear up/down Underwear - Performed by helper: Thread/unthread left underwear leg, Pull underwear up/down Pants- Performed by patient: Thread/unthread right pants leg, Thread/unthread left pants leg, Pull pants up/down Pants- Performed by helper: Thread/unthread left pants leg, Pull pants up/down Non-skid slipper socks- Performed by patient: Don/doff right sock, Don/doff left sock     Socks - Performed by helper: Don/doff right sock, Don/doff left sock   Shoes - Performed by helper: Don/doff right shoe, Don/doff left shoe, Fasten right, Fasten left  TED Hose - Performed by helper: Don/doff right TED hose, Don/doff left TED hose  Lower body assist Assist for lower body dressing: Touching or  steadying assistance (Pt > 75%)      Toileting Toileting   Toileting steps completed by patient: Adjust clothing prior to toileting, Performs perineal hygiene, Adjust clothing after toileting Toileting steps completed by helper: Adjust clothing prior to toileting, Performs perineal hygiene, Adjust clothing after toileting Toileting Assistive Devices: Grab bar or rail  Toileting assist Assist level: Set up/obtain supplies, Supervision or verbal cues   Transfers Chair/bed transfer Chair/bed transfer activity did not occur: Safety/medical concerns Chair/bed transfer method: Ambulatory Chair/bed transfer assist level: Supervision or verbal cues Chair/bed transfer assistive device: Armrests, Medical sales representative Ambulation activity did not occur: Safety/medical concerns   Max distance: 200 Assist level: Touching or steadying assistance (Pt > 75%)   Wheelchair   Type: Manual Max wheelchair distance: 100 Assist Level: Supervision or verbal cues  Cognition Comprehension Comprehension assist level: Follows complex conversation/direction with extra time/assistive device  Expression Expression assist level: Expresses complex ideas: With no assist  Social Interaction Social Interaction assist level: Interacts appropriately with others with medication or extra time (anti-anxiety, antidepressant).  Problem Solving Problem solving assist level: Solves complex problems: With extra time  Memory Memory assist level: More than reasonable amount of time   Medical Problem List and Plan: 1.  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 IVIG course completed. No current plans for plasma exchange  -continue CIR  2.  DVT Prophylaxis/Anticoagulation: Subcutaneous heparin.  Vascular study neg for DVT 3. Pain Management: Lidoderm patch, Duragesic patch  -pain remains controlled 4. Mood: Zoloft 50 mg daily, Ativan as needed 5. Neuropsych: This patient is capable of making decisions on her  own behalf. 6. Skin/Wound Care: Routine skin checks 7. Fluids/Electrolytes/Nutrition:  8. Tracheostomy 01/23/2017. Discontinued on 02/11/2017 9. Gastrostomy tube 01/26/2017 per interventional radiology. Diet advanced to regular 02/04/2017 after modified barium swallow Follow-up speech therapy,   would not remove Gastrostomy tube until 03/09/2017 10. Atrial fibrillation/bradycardia. Cardiac rate controlled at present. Follow-up with cardiology services Vitals:   02/14/17 0549 02/14/17 1500  BP: 136/72 136/78  Pulse: 98 84  Resp: 18 16  Temp: 98.5 F (36.9 C) 98.1 F (36.7 C)   11. 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 Underwent tarorhaphy 02/04/2017. Continue erythromycin ointment for now follow-up ophthalmology services as needed, Contacted ophthalmology advised continued eyedrops.  -eye clean/decreased irritation and drainage  LOS (Days) 10 A FACE TO FACE EVALUATION WAS PERFORMED  Ankit Lorie Phenix, MD 02/14/2017 7:53 PM

## 2017-02-15 DIAGNOSIS — D62 Acute posthemorrhagic anemia: Secondary | ICD-10-CM

## 2017-02-15 NOTE — Progress Notes (Signed)
[place] PHYSICAL MEDICINE & REHABILITATION     PROGRESS NOTE    Subjective/Complaints: Pt seen laying in bed this AM.  She slept well overnight.  She denies complaints this AM.   ROS: Denies nausea, vomiting, diarrhea, cough, shortness of breath or chest pain   Objective: Vital Signs: Blood pressure 128/72, pulse 62, temperature 98.1 F (36.7 C), temperature source Oral, resp. rate 18, weight 73 kg (160 lb 15 oz), SpO2 98 %. No results found. No results for input(s): WBC, HGB, HCT, PLT in the last 72 hours. No results for input(s): NA, K, CL, GLUCOSE, BUN, CREATININE, CALCIUM in the last 72 hours.  Invalid input(s): CO CBG (last 3)  No results for input(s): GLUCAP in the last 72 hours.  Wt Readings from Last 3 Encounters:  02/15/17 73 kg (160 lb 15 oz)  02/04/17 68.1 kg (150 lb 2.1 oz)  10/15/16 72.7 kg (160 lb 5 oz)    Physical Exam:  Constitutional: She appears well-developed and well-nourished. NAD.  HENT: Normocephalic. Atraumatic.  Eyes: Left lid healing gauze/sutures Neck: Bandage over her tracheostomy site Phonation good Cardiovascular: RRR. No JVD.  Respiratory: CTA B. Unlabored.  GI: Soft. Bowel sounds are normal. +PEG Musculoskeletal: She exhibits no edema or tenderness.  Neurological: She is alert and oriented.  Motor:  LUE: 3-/5 proximally, 4-/5 distally (stable) LLE:  4+/5 proximally, 4+/5 distally RUE: 4-/5 proximal to 4/5 distal RLE: 4+/5 proximally, 4+/5 distally Skin: Skin is warm and dry. See above.  Psychiatric: Normal mood and behavior.   Assessment/Plan: 1. 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 Physiatrist is providing close team supervision and 24 hour management of active medical problems listed below. Physiatrist and rehab team continue to assess barriers to discharge/monitor patient progress toward functional and medical  goals.  Function:  Bathing Bathing position   Position: Shower  Bathing parts Body parts bathed by patient: Right arm, Left arm, Chest, Abdomen, Front perineal area, Right upper leg, Left upper leg, Buttocks, Right lower leg, Left lower leg Body parts bathed by helper: Back  Bathing assist Assist Level: Touching or steadying assistance(Pt > 75%)      Upper Body Dressing/Undressing Upper body dressing   What is the patient wearing?: Pull over shirt/dress Bra - Perfomed by patient: Thread/unthread right bra strap, Thread/unthread left bra strap Bra - Perfomed by helper: Hook/unhook bra (pull down sports bra) Pull over shirt/dress - Perfomed by patient: Thread/unthread left sleeve, Thread/unthread right sleeve, Put head through opening, Pull shirt over trunk Pull over shirt/dress - Perfomed by helper: Put head through opening, Pull shirt over trunk        Upper body assist Assist Level: Set up   Set up : To obtain clothing/put away  Lower Body Dressing/Undressing Lower body dressing   What is the patient wearing?: Pants, [employer], Underwear, Socks, Non-skid slipper socks Underwear - Performed by patient: Thread/unthread right underwear leg, Thread/unthread left underwear leg, Pull underwear up/down Underwear - Performed by helper: Thread/unthread left underwear leg, Pull underwear up/down Pants- Performed by patient: Thread/unthread right pants leg, Thread/unthread left pants leg, Pull pants up/down Pants- Performed by helper: Thread/unthread left pants leg, Pull pants up/down Non-skid slipper socks- Performed by patient: Don/doff right sock, Don/doff left sock     Socks - Performed by helper: Don/doff right sock, Don/doff left sock   Shoes - Performed by helper: Don/doff right shoe, Don/doff left shoe, Fasten right, [employer]  left       TED Hose - Performed by helper: Don/doff right TED hose, Don/doff left TED hose  Lower body assist Assist for lower body dressing: Touching or  steadying assistance (Pt > 75%)      Toileting Toileting   Toileting steps completed by patient: Adjust clothing prior to toileting, Performs perineal hygiene, Adjust clothing after toileting Toileting steps completed by helper: Adjust clothing prior to toileting, Performs perineal hygiene, Adjust clothing after toileting Toileting Assistive Devices: Grab bar or rail  Toileting assist Assist level: Set up/obtain supplies, Supervision or verbal cues   Transfers Chair/bed transfer Chair/bed transfer activity did not occur: Safety/medical concerns Chair/bed transfer method: Ambulatory Chair/bed transfer assist level: Supervision or verbal cues Chair/bed transfer assistive device: Armrests, Medical sales representative Ambulation activity did not occur: Safety/medical concerns   Max distance: 200 Assist level: Touching or steadying assistance (Pt > 75%)   Wheelchair   Type: Manual Max wheelchair distance: 100 Assist Level: Supervision or verbal cues  Cognition Comprehension Comprehension assist level: Follows complex conversation/direction with extra time/assistive device  Expression Expression assist level: Expresses complex ideas: With no assist  Social Interaction Social Interaction assist level: Interacts appropriately with others with medication or extra time (anti-anxiety, antidepressant).  Problem Solving Problem solving assist level: Solves complex problems: With extra time  Memory Memory assist level: More than reasonable amount of time   Medical Problem List and Plan: 1.  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 IVIG course completed. No current plans for plasma exchange  -continue CIR  2.  DVT Prophylaxis/Anticoagulation: Subcutaneous heparin.  Vascular study neg for DVT 3. Pain Management: Lidoderm patch, Duragesic patch  -pain remains controlled 4. Mood: Zoloft 50 mg daily, Ativan as needed 5. Neuropsych: This patient is capable of making decisions on her  own behalf. 6. Skin/Wound Care: Routine skin checks 7. Fluids/Electrolytes/Nutrition:   Labs ordered for tomorrow 8. Tracheostomy 01/23/2017. Discontinued on 02/11/2017 9. Gastrostomy tube 01/26/2017 per interventional radiology. Diet advanced to regular 02/04/2017 after modified barium swallow Follow-up speech therapy,   would not remove Gastrostomy tube until 03/09/2017 10. Atrial fibrillation/bradycardia. Cardiac rate controlled at present. Follow-up with cardiology services Vitals:   02/14/17 1500 02/15/17 0557  BP: 136/78 128/72  Pulse: 84 62  Resp: 16 18  Temp: 98.1 F (36.7 C) 98.1 F (36.7 C)   11. 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 Underwent tarorhaphy 02/04/2017. Continue erythromycin ointment for now follow-up ophthalmology services as needed, Contacted ophthalmology advised continued eyedrops.  -eye clean/decreased irritation and drainage 12. ABLA  Hb 11.2 on 2/22  Labs ordered for tomorrow  LOS (Days) 11 A FACE TO FACE EVALUATION WAS PERFORMED  Jacqulynn Shappell Lorie Phenix, MD 02/15/2017 8:26 AM

## 2017-02-15 NOTE — Progress Notes (Signed)
Occupational Therapy Note  Patient Details  Name: Joanne Morrison MRN: [id number] Date of Birth: 1951-08-24  Therapist attempted to see pt for unscheduled OT session. Pt sitting up in w/c and voicing increased fatigue from previous day's therapy sessions. Pt desiring to stay resting and "be ready for tomorrow". Pt left seated in w/c with husband present, all needs in reach.   Lewis, Elinora Weigand C 02/15/2017, 2:19 PM

## 2017-02-16 ENCOUNTER — Inpatient Hospital Stay (HOSPITAL_COMMUNITY): Payer: BLUE CROSS/BLUE SHIELD | Admitting: Speech Pathology

## 2017-02-16 ENCOUNTER — Inpatient Hospital Stay (HOSPITAL_COMMUNITY): Payer: BLUE CROSS/BLUE SHIELD | Admitting: Physical Therapy

## 2017-02-16 ENCOUNTER — Inpatient Hospital Stay (HOSPITAL_COMMUNITY): Payer: BLUE CROSS/BLUE SHIELD | Admitting: Occupational Therapy

## 2017-02-16 DIAGNOSIS — R1312 Dysphagia, oropharyngeal phase: Secondary | ICD-10-CM

## 2017-02-16 LAB — CBC WITH DIFFERENTIAL/PLATELET
Basophils Absolute: 0 10*3/uL (ref 0.0–0.1)
Basophils Relative: 1 %
EOS ABS: 0.2 10*3/uL (ref 0.0–0.7)
Eosinophils Relative: 5 %
HCT: 35.8 % — ABNORMAL LOW (ref 36.0–46.0)
HEMOGLOBIN: 11.2 g/dL — AB (ref 12.0–15.0)
LYMPHS ABS: 1.6 10*3/uL (ref 0.7–4.0)
Lymphocytes Relative: 40 %
MCH: 27.2 pg (ref 26.0–34.0)
MCHC: 31.3 g/dL (ref 30.0–36.0)
MCV: 86.9 fL (ref 78.0–100.0)
MONOS PCT: 8 %
Monocytes Absolute: 0.3 10*3/uL (ref 0.1–1.0)
NEUTROS PCT: 46 %
Neutro Abs: 1.9 10*3/uL (ref 1.7–7.7)
Platelets: 180 10*3/uL (ref 150–400)
RBC: 4.12 MIL/uL (ref 3.87–5.11)
RDW: 16.8 % — ABNORMAL HIGH (ref 11.5–15.5)
WBC: 4 10*3/uL (ref 4.0–10.5)

## 2017-02-16 LAB — BASIC METABOLIC PANEL
ANION GAP: 6 (ref 5–15)
BUN: 5 mg/dL — ABNORMAL LOW (ref 6–20)
CALCIUM: 9.1 mg/dL (ref 8.9–10.3)
CHLORIDE: 104 mmol/L (ref 101–111)
CO2: 31 mmol/L (ref 22–32)
CREATININE: 0.68 mg/dL (ref 0.44–1.00)
GFR calc non Af Amer: >60 mL/min (ref >60)
Glucose, Bld: 98 mg/dL (ref 65–99)
Potassium: 3.1 mmol/L — ABNORMAL LOW (ref 3.5–5.1)
SODIUM: 141 mmol/L (ref 135–145)

## 2017-02-16 MED ORDER — PANTOPRAZOLE SODIUM 40 MG PO TBEC
40.0000 mg | DELAYED_RELEASE_TABLET | Freq: Every day | ORAL | Status: DC
  Administered 2017-02-17 – 2017-02-19 (×3): 40 mg via ORAL
  Filled 2017-02-16 (×4): qty 1

## 2017-02-16 MED ORDER — CAMPHOR-MENTHOL 0.5-0.5 % EX LOTN
TOPICAL_LOTION | CUTANEOUS | Status: DC | PRN
  Filled 2017-02-16: qty 222

## 2017-02-16 MED ORDER — GABAPENTIN 100 MG PO CAPS: 100.0000 mg | ORAL_CAPSULE | Freq: Two times a day (BID) | ORAL | Status: DC

## 2017-02-16 MED ORDER — DIPHENHYDRAMINE HCL 25 MG PO CAPS
25.0000 mg | ORAL_CAPSULE | Freq: Four times a day (QID) | ORAL | Status: DC | PRN
  Administered 2017-02-16 – 2017-02-19 (×5): 25 mg via ORAL
  Filled 2017-02-16 (×5): qty 1

## 2017-02-16 NOTE — Progress Notes (Signed)
Physical Therapy Session Note  Patient Details  Name: Shravya Julson MRN: [id number] Date of Birth: [birth year]  Today's Date: 02/16/2017 PT Individual Time: 0900-0930 PT Individual Time Calculation (min): 30 min   Short Term Goals: Week 2:  PT Short Term Goal 1 (Week 2): Pt will perform bed mobility with S from flat bed without hospital bed features PT Short Term Goal 2 (Week 2): Pt will ambulate 100' minA with LRAD PT Short Term Goal 3 (Week 2): Pt will ascend/descent four 6" stairs with 1 handrail and modA PT Short Term Goal 4 (Week 2): Pt will demonstrate dynamic standing balance x5 min with close S  Skilled Therapeutic Interventions/Progress Updates:    no c/o pain.  Session focus on activity tolerance via self care tasks, ambulation, and transfers.    Pt requesting to use bathroom and dress prior to leaving room.  Supervision for sit<>stand, ambulation into bathroom, toileting, and lower body dressing with sit<>stand.  Pt requires short seated rest break following toileting and dressing.  Gait training to therapy gym with supervision and RW.  Pt performs 8x sit<>stand with UE support on knees to stand and no UE support focus on controlled descent.  Pt performs 10 reps mini squats with limited UE support for LE strengthening and endurance.  Pt returns to room in w/c at end of session and positioned upright to await OT session.    Therapy Documentation Precautions:  Precautions Precautions: Fall Precaution Comments: trach Restrictions Weight Bearing Restrictions: No   See Function Navigator for Current Functional Status.   Therapy/Group: Individual Therapy  Carnelius Hammitt E Penven-Crew 02/16/2017, 11:57 AM

## 2017-02-16 NOTE — Progress Notes (Signed)
Speech Language Pathology Daily Session Note  Patient Details  Name: Joanne Morrison MRN: [id number] Date of Birth: Jul 23, 1951  Today's Date: 02/16/2017 SLP Individual Time: 42-1445 SLP Individual Time Calculation (min): 30 min  Short Term Goals: Week 2: SLP Short Term Goal 1 (Week 2): Patient will consume current diet without overt s/s of aspiration and Independent use of swallowing compensatory strategies.  SLP Short Term Goal 2 (Week 2): Patient will utilize over-articulation to maximize intelligibility to 100% at the conversation level in a moderately noisy environment with Mod I cues.  SLP Short Term Goal 3 (Week 2): Patient will recall new, daily information with Mod I verbal cues.  SLP Short Term Goal 4 (Week 2): Patient will complete complex problem solving tasks with supervision verbal cues.   Skilled Therapeutic Interventions: Skilled treatment session focused on cognition goals. SLP facilitated session by providing Min A to supervision cues for completion of complex deductive reasoning puzzle. Pt with improvement with use of strategies. Pt was left in husband's presence and he was going to return her to her room. Continue per current plan of care.      Function:    Cognition Comprehension Comprehension assist level: Follows complex conversation/direction with extra time/assistive device  Expression   Expression assist level: Expresses complex ideas: With no assist  Social Interaction Social Interaction assist level: Interacts appropriately with others with medication or extra time (anti-anxiety, antidepressant).  Problem Solving Problem solving assist level: Solves complex problems: With extra time  Memory Memory assist level: More than reasonable amount of time    Pain    Therapy/Group: Individual Therapy   Tanysha Quant B. Rutherford Nail, M.S., CCC-SLP Speech-Language Pathologist   Artrice Kraker 02/16/2017, 4:07 PM

## 2017-02-16 NOTE — Progress Notes (Signed)
Occupational Therapy Session Note  Patient Details  Name: Joanne Morrison MRN: [id number] Date of Birth: 06/13/1951  Today's Date: 02/16/2017 OT Individual Time: 1045-1200 and 1300-1400 OT Individual Time Calculation (min): 75 min and 60 min   Short Term Goals: Week 2:  OT Short Term Goal 1 (Week 2): STG=LTG due to LOS  Skilled Therapeutic Interventions/Progress Updates:    Session One: Pt seen for OT session focusing on functional ambulation/ kitchen mobility and general strengthening/ activity tolerance.  Pt in supine upon arrival, ready for tx session. Desired to complete bathing/dressing during PM session. She ambulated to ADL apartment with RW and close supervision. Completed dynamic standing task required to remove and replace items from overhead kitchen cabinet, demonstrating ability to manage different weighted items using R/L with increased time. Practiced obtaining items from refrigerator while managing RW as well as transporting items while managing RW with VCs and demonstration provided for problem solving techniques. Seated rest breaks provided throughout. In therapy gym, completed 12 minutes on NuStep using B UEs/LEs, began on level 4 progressing to level 6.   Completed functional ambulation without AD with min A, however, pt fatiguing very quickly during trials. Seated EOM, completed dowel rod exercises, extending UEs to hit beach ball back and forth with therapist. VCs for proper technique/ positioning to prevent UE compensation. Pt ambulated with RW to room at end of session, left seated in w/c with husband and RN present.   Session Two: Pt seen for OT ADL bathing/dressing session. Pt sitting up upon arrival with husband present, agreeable to showering in ADL apartment in simulation of home environment.  She ambulated with supervision using RW to ADL apartment. Functional transfers completed throughout session with supervision and occasional VCs for hand placement and technique  of transfer. She bathed seated on tub transfer bench, assist to wash hair and back. She dressed seated in chair with increased time and supervision for standing at RW to pull pants up.  She self propelled w/c back to room with supervision. She completed grooming tasks in standing utilizing B UEs in functional manner. Pt left seated in w/c at end of session, all needs in reach and husband present.   Therapy Documentation Precautions:  Precautions Precautions: Fall Precaution Comments: trach Restrictions Weight Bearing Restrictions: No Pain:   No/ denies pain ADL: ADL ADL Comments: refer to functional navigator  See Function Navigator for Current Functional Status.   Therapy/Group: Individual Therapy  Lewis, Crissa Sowder C 02/16/2017, 7:09 AM

## 2017-02-16 NOTE — Progress Notes (Signed)
[place] PHYSICAL MEDICINE & REHABILITATION     PROGRESS NOTE    Subjective/Complaints: Finishing breakfast. Sitting with husband. Pleased with progress  ROS: pt denies nausea, vomiting, diarrhea, cough, shortness of breath or chest pain   Objective: Vital Signs: Blood pressure (!) 150/65, pulse 78, temperature 98.2 F (36.8 C), temperature source Oral, resp. rate 18, weight 72 kg (158 lb 11.7 oz), SpO2 96 %. No results found.  Recent Labs  02/16/17 0551  WBC 4.0  HGB 11.2*  HCT 35.8*  PLT 180    Recent Labs  02/16/17 0551  NA 141  K 3.1*  CL 104  GLUCOSE 98  BUN 5*  CREATININE 0.68  CALCIUM 9.1   CBG (last 3)  No results for input(s): GLUCAP in the last 72 hours.  Wt Readings from Last 3 Encounters:  02/16/17 72 kg (158 lb 11.7 oz)  02/04/17 68.1 kg (150 lb 2.1 oz)  10/15/16 72.7 kg (160 lb 5 oz)    Physical Exam:  Constitutional: She appears well-developed and well-nourished. NAD.  HENT: Normocephalic. Atraumatic.  Eyes: Left lid with gauze/sutures Neck: scab over previous stoma Cardiovascular: RRR. No JVD.  Respiratory: CTA B. Unlabored.  GI: Soft. Bowel sounds are normal. +PEG Musculoskeletal: She exhibits no edema or tenderness.  Neurological: She is alert and oriented.  Motor:  LUE: 3/5 proximally, 4/5 distally (stable) LLE:  4+/5 proximally, 4+/5 distally RUE: 4-/5 proximal to 4/5 distal RLE: 4+/5 proximally, 4+/5 distally Decreased sensation over soles of feet.  Right CN 7 injury Skin: Skin is warm and dry. See above.  Psychiatric: Normal mood and behavior.   Assessment/Plan: 1. 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 Physiatrist is providing close team supervision and 24 hour management of active medical problems listed below. Physiatrist and rehab team continue to assess barriers to discharge/monitor patient progress toward functional and  medical goals.  Function:  Bathing Bathing position   Position: Shower  Bathing parts Body parts bathed by patient: Right arm, Left arm, Chest, Abdomen, Front perineal area, Right upper leg, Left upper leg, Buttocks, Right lower leg, Left lower leg Body parts bathed by helper: Back  Bathing assist Assist Level: Touching or steadying assistance(Pt > 75%)      Upper Body Dressing/Undressing Upper body dressing   What is the patient wearing?: Pull over shirt/dress Bra - Perfomed by patient: Thread/unthread right bra strap, Thread/unthread left bra strap Bra - Perfomed by helper: Hook/unhook bra (pull down sports bra) Pull over shirt/dress - Perfomed by patient: Thread/unthread left sleeve, Thread/unthread right sleeve, Put head through opening, Pull shirt over trunk Pull over shirt/dress - Perfomed by helper: Put head through opening, Pull shirt over trunk        Upper body assist Assist Level: Set up   Set up : To obtain clothing/put away  Lower Body Dressing/Undressing Lower body dressing   What is the patient wearing?: Pants, [employer], Underwear, Socks, Non-skid slipper socks Underwear - Performed by patient: Thread/unthread right underwear leg, Thread/unthread left underwear leg, Pull underwear up/down Underwear - Performed by helper: Thread/unthread left underwear leg, Pull underwear up/down Pants- Performed by patient: Thread/unthread right pants leg, Thread/unthread left pants leg, Pull pants up/down Pants- Performed by helper: Thread/unthread left pants leg, Pull pants up/down Non-skid slipper socks- Performed by patient: Don/doff right sock, Don/doff left sock     Socks - Performed by helper: Don/doff right sock, Don/doff left sock  Shoes - Performed by helper: Don/doff right shoe, Don/doff left shoe, Fasten right, Fasten left       TED Hose - Performed by helper: Don/doff right TED hose, Don/doff left TED hose  Lower body assist Assist for lower body dressing:  Touching or steadying assistance (Pt > 75%)      Toileting Toileting   Toileting steps completed by patient: Adjust clothing prior to toileting, Performs perineal hygiene, Adjust clothing after toileting Toileting steps completed by helper: Adjust clothing prior to toileting, Performs perineal hygiene, Adjust clothing after toileting Toileting Assistive Devices: Grab bar or rail  Toileting assist Assist level: More than reasonable time   Transfers Chair/bed transfer Chair/bed transfer activity did not occur: Safety/medical concerns Chair/bed transfer method: Stand pivot Chair/bed transfer assist level: Supervision or verbal cues Chair/bed transfer assistive device: Armrests, Bedrails     Locomotion Ambulation Ambulation activity did not occur: Safety/medical concerns   Max distance: 200 Assist level: Touching or steadying assistance (Pt > 75%)   Wheelchair   Type: Manual Max wheelchair distance: 100 Assist Level: Supervision or verbal cues  Cognition Comprehension Comprehension assist level: Follows complex conversation/direction with extra time/assistive device  Expression Expression assist level: Expresses complex ideas: With no assist  Social Interaction Social Interaction assist level: Interacts appropriately with others with medication or extra time (anti-anxiety, antidepressant).  Problem Solving Problem solving assist level: Solves complex problems: With extra time  Memory Memory assist level: More than reasonable amount of time   Medical Problem List and Plan: 1.  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 IVIG course completed. No current plans for plasma exchange  -continue CIR   -making motor/functional gains 2.  DVT Prophylaxis/Anticoagulation: Subcutaneous heparin.  Vascular study neg for DVT 3. Pain Management: Lidoderm patch, Duragesic patch  -pain remains controlled 4. Mood: Zoloft 50 mg daily, Ativan as needed 5. Neuropsych: This patient is capable of  making decisions on her own behalf. 6. Skin/Wound Care: Routine skin checks 7. Fluids/Electrolytes/Nutrition:   I personally reviewed the patient's labs today.   -replete K+, 3.1 8. Tracheostomy 01/23/2017. Discontinued on 02/11/2017 9. Gastrostomy tube 01/26/2017 per interventional radiology. Diet advanced to regular 02/04/2017 after modified barium swallow Follow-up speech therapy,     -remove Gastrostomy tube at end of month 10. Atrial fibrillation/bradycardia. Cardiac rate controlled at present. Follow-up with cardiology services Vitals:   02/15/17 1530 02/16/17 0610  BP: (!) 160/74 (!) 150/65  Pulse: 80 78  Resp: 20 18  Temp: 98.3 F (36.8 C) 98.2 F (36.8 C)   11. 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 Underwent tarorhaphy 02/04/2017. Continue erythromycin ointment for now follow-up ophthalmology services as needed, Contacted ophthalmology advised continued eyedrops.  -eye clean/decreased irritation and drainage 12. ABLA  Hb 11.2 on 2/22  Holding at 11.2 today  LOS (Days) 12 A FACE TO FACE EVALUATION WAS PERFORMED  Meredith Staggers, MD 02/16/2017 9:05 AM

## 2017-02-17 ENCOUNTER — Inpatient Hospital Stay (HOSPITAL_COMMUNITY): Payer: BLUE CROSS/BLUE SHIELD | Admitting: Physical Therapy

## 2017-02-17 ENCOUNTER — Inpatient Hospital Stay (HOSPITAL_COMMUNITY): Payer: BLUE CROSS/BLUE SHIELD | Admitting: Speech Pathology

## 2017-02-17 ENCOUNTER — Inpatient Hospital Stay (HOSPITAL_COMMUNITY): Payer: BLUE CROSS/BLUE SHIELD | Admitting: Occupational Therapy

## 2017-02-17 NOTE — Progress Notes (Signed)
Physical Therapy Note  Patient Details  Name: Joanne Morrison MRN: [id number] Date of Birth: 03/12/1951 Today's Date: 02/17/2017    Time: 830-900 30 minutes  1:1 No c/o pain. Pt c/o soreness from yesterday's sessions.  Pt able to perform gait to bathroom and toilet transfers with RW with min A.  Gait training sideways and backwards with RW with min A for LE strengthening, balance and activity tolerance. Pt able to gait 100' with min A and RW back towards room at end of session.   Kristeen Lantz 02/17/2017, 9:08 AM

## 2017-02-17 NOTE — Progress Notes (Signed)
Occupational Therapy Session Note  Patient Details  Name: Joanne Morrison MRN: [id number] Date of Birth: Apr 25, 1951  Today's Date: 02/17/2017 OT Individual Time: 1035-1105 OT Individual Time Calculation (min): 30 min    Short Term Goals: Week 2:  OT Short Term Goal 1 (Week 2): STG=LTG due to LOS      Skilled Therapeutic Interventions/Progress Updates:    Pt received in bathroom with spouse and she stated she was eager to work on her LUE strength and requested to use the Nustep. Pt stood from low toilet with grabbars with close S and transferred to w/c.   In gym, pt stepped to mat and worked on sit to stand from mat progressing from pushing up with hands, to hands on thighs, to holding a bar, to standing without any support.  Pt stepped to Nustep with steadying A.  Use BUE on bars at resistance 4 for 2 minutes alternating with LUE only at resistance 2 for 11minutes for a total of 10 min.    Theraband exercises for LUE with shoulder adduction for lats and tricep extension 12 reps each.  Educated spouse with return demonstration on how to do the exercises in the room.    Spouse and pt returned to the room.    Therapy Documentation Precautions:  Precautions Precautions: Fall Precaution Comments: trach Restrictions Weight Bearing Restrictions: No  Pain: Pain Assessment Pain Assessment: No/denies pain ADL: ADL ADL Comments: refer to functional navigator  See Function Navigator for Current Functional Status.   Therapy/Group: Individual Therapy  [place] 02/17/2017, 12:00 PM

## 2017-02-17 NOTE — Progress Notes (Signed)
[facility]ILIT[facility]     PROGRESS NOTE    Subjective/Complaints: Complains of some generalized itching at night. Otherwise feels quite good. Benadryl helped a lot  ROS: pt denies nausea, vomiting, diarrhea, cough, shortness of breath or chest pain    Objective: Vital Signs: Blood pressure (!) 143/65, pulse 80, temperature 98.4 F (36.9 C), temperature source Oral, resp. rate 18, weight 72.6 kg (160 lb 0.9 oz), SpO2 95 %. No results found.  Recent Labs  02/16/17 0551  WBC 4.0  HGB 11.2*  HCT 35.8*  PLT 180    Recent Labs  02/16/17 0551  NA 141  K 3.1*  CL 104  GLUCOSE 98  BUN 5*  CREATININE 0.68  CALCIUM 9.1   CBG (last 3)  No results for input(s): GLUCAP in the last 72 hours.  Wt Readings from Last 3 Encounters:  02/17/17 72.6 kg (160 lb 0.9 oz)  02/04/17 68.1 kg (150 lb 2.1 oz)  10/15/16 72.7 kg (160 lb 5 oz)    Physical Exam:  Constitutional: She appears well-developed and well-nourished. NAD.  HENT: Normocephalic. Atraumatic.  Eyes: Left lid with gauze/sutures--stable Neck: scab over previous stoma gone Cardiovascular: RRR. No JVD.  Respiratory: CTA B. Unlabored.  GI: Soft. Bowel sounds are normal. +PEG Musculoskeletal: She exhibits no edema or tenderness.  Neurological: She is alert and oriented.  Motor:  LUE: 3/5 proximally, 4/5 distally   LLE:  4+/5 proximally, 4+/5 distally RUE: 4-/5 proximal to 4/5 distal RLE: 4+/5 proximally, 4+/5 distally---MOTOR EXAM STABLE Decreased sensation over soles of feet.  left CN 7 injury Skin: Skin is warm and dry. No rash Psychiatric: Normal mood and behavior.   Assessment/Plan: 1. 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 Physiatrist is providing close team supervision and 24 hour management of active medical problems listed below. Physiatrist and rehab team continue to assess barriers to  discharge/monitor patient progress toward functional and medical goals.  Function:  Bathing Bathing position   Position: Shower  Bathing parts Body parts bathed by patient: Right arm, Left arm, Chest, Abdomen, Front perineal area, Right upper leg, Left upper leg, Buttocks, Right lower leg, Left lower leg Body parts bathed by helper: Back  Bathing assist Assist Level: Supervision or verbal cues      Upper Body Dressing/Undressing Upper body dressing   What is the patient wearing?: Pull over shirt/dress Bra - Perfomed by patient: Thread/unthread right bra strap, Thread/unthread left bra strap Bra - Perfomed by helper: Hook/unhook bra (pull down sports bra) Pull over shirt/dress - Perfomed by patient: Thread/unthread right sleeve, Thread/unthread left sleeve, Put head through opening, Pull shirt over trunk Pull over shirt/dress - Perfomed by helper: Put head through opening, Pull shirt over trunk        Upper body assist Assist Level: Set up   Set up : To obtain clothing/put away  Lower Body Dressing/Undressing Lower body dressing   What is the patient wearing?: [employer], Underwear, Shoes, Socks Underwear - Performed by patient: Thread/unthread right underwear leg, Thread/unthread left underwear leg, Pull underwear up/down Underwear - Performed by helper: Thread/unthread left underwear leg, Pull underwear up/down Pants- Performed by patient: Thread/unthread right pants leg, Thread/unthread left pants leg, Pull pants up/down Pants- Performed by helper: Thread/unthread left pants leg, Pull pants up/down Non-skid slipper socks- Performed by patient: Don/doff right sock, Don/doff left sock   Socks - Performed by patient: Don/doff right sock,  Don/doff left sock Socks - Performed by helper: Don/doff right sock, Don/doff left sock Shoes - Performed by patient: Don/doff right shoe, Don/doff left shoe Shoes - Performed by helper: Don/doff right shoe, Don/doff left shoe, Fasten right, Fasten  left       TED Hose - Performed by helper: Don/doff right TED hose, Don/doff left TED hose  Lower body assist Assist for lower body dressing: Supervision or verbal cues      Toileting Toileting   Toileting steps completed by patient: Adjust clothing prior to toileting, Performs perineal hygiene, Adjust clothing after toileting Toileting steps completed by helper: Adjust clothing prior to toileting, Performs perineal hygiene, Adjust clothing after toileting Toileting Assistive Devices: Grab bar or rail  Toileting assist Assist level: Supervision or verbal cues   Transfers Chair/bed transfer Chair/bed transfer activity did not occur: Safety/medical concerns Chair/bed transfer method: Stand pivot Chair/bed transfer assist level: Supervision or verbal cues Chair/bed transfer assistive device: Armrests, Bedrails     Locomotion Ambulation Ambulation activity did not occur: Safety/medical concerns   Max distance: 200 Assist level: Touching or steadying assistance (Pt > 75%)   Wheelchair   Type: Manual Max wheelchair distance: 100 Assist Level: Supervision or verbal cues  Cognition Comprehension Comprehension assist level: Follows complex conversation/direction with extra time/assistive device  Expression Expression assist level: Expresses complex ideas: With no assist  Social Interaction Social Interaction assist level: Interacts appropriately with others with medication or extra time (anti-anxiety, antidepressant).  Problem Solving Problem solving assist level: Solves complex problems: With extra time  Memory Memory assist level: More than reasonable amount of time   Medical Problem List and Plan: 1.  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 IVIG course completed. No current plans for plasma exchange  -continue CIR   -making motor/functional gains 2.  DVT Prophylaxis/Anticoagulation: Subcutaneous heparin.  Vascular study neg for DVT 3. Pain Management: Lidoderm patch,  Duragesic patch--wean soon??  -may be contributing to pruritus  -pain remains controlled 4. Mood: Zoloft 50 mg daily, Ativan as needed 5. Neuropsych: This patient is capable of making decisions on her own behalf. 6. Skin/Wound Care: Routine skin checks 7. Fluids/Electrolytes/Nutrition:   -replete K+, 3.1 8. Tracheostomy 01/23/2017. Discontinued on 02/11/2017 9. Gastrostomy tube 01/26/2017 per interventional radiology. Diet advanced to regular 02/04/2017 after modified barium swallow Follow-up speech therapy,     -remove Gastrostomy tube at end of month 10. Atrial fibrillation/bradycardia. Cardiac rate controlled at present. Follow-up with cardiology services Vitals:   02/16/17 1536 02/17/17 0500  BP: (!) 150/81 (!) 143/65  Pulse: 73 80  Resp: 20 18  Temp: 98.4 F (36.9 C) 98.4 F (36.9 C)   11. 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 Underwent tarorhaphy 02/04/2017. Continue erythromycin ointment for now follow-up ophthalmology services as needed, Contacted ophthalmology advised continued eyedrops.  -eye clean/decreased irritation and drainage 12. ABLA  Hb 11.2 on 2/22  Holding at 11.2    LOS (Days) 13 A FACE TO FACE EVALUATION WAS PERFORMED  Meredith Staggers, MD 02/17/2017 9:06 AM

## 2017-02-17 NOTE — Progress Notes (Signed)
Occupational Therapy Session Note  Patient Details  Name: Joanne Morrison MRN: [id number] Date of Birth: 20-Jun-1951  Today's Date: 02/17/2017 OT Individual Time: 1300-1400 OT Individual Time Calculation (min): 60 min    Short Term Goals: Week 2:  OT Short Term Goal 1 (Week 2): STG=LTG due to LOS  Skilled Therapeutic Interventions/Progress Updates:    Pt seen for OT session focusing on UE ROM and functional standing balance/ endurance. Pt received seated on EOB, agreeable to tx session.  She ambulated to [employer] and completed [employer] activity. Completed multiple trials including in standing position with and without AD, required to cross midline and outside BOS. Completed at supervision with AD and close supervision- guarding assist with AD. Focus on use of L UE not using compensatory strategies of shoulder, focus on elbow flexion/ extension requiring tactile facilitation due to decreased bicep/tricep strength and control.  Pt returned to room at end of session, left seated in w/c with all needs in reach. At beginning of session discussed at length potential for community outing tomorrow to grocery store. Pt initially refusing outing, however, with strong encouragement and education regarding benefits of community outing both physically and mentally/ emotionally, relevance to OT/PT goals, and to assist with d/c home and into community setting pt willing to participate in outing tomorrow.   Therapy Documentation Precautions:  Precautions Precautions: Fall Restrictions Weight Bearing Restrictions: No Pain:   No/ denies pain ADL: ADL ADL Comments: refer to functional navigator  See Function Navigator for Current Functional Status.   Therapy/Group: Individual Therapy  Lewis, Mervyn Pflaum C 02/17/2017, 7:20 AM

## 2017-02-17 NOTE — Progress Notes (Signed)
Speech Language Pathology Daily Session Note  Patient Details  Name: Latoyia Oris MRN: [id number] Date of Birth: 13-Nov-1951  Today's Date: 02/17/2017 SLP Individual Time: 1000-1030 SLP Individual Time Calculation (min): 30 min  Short Term Goals: Week 2: SLP Short Term Goal 1 (Week 2): Patient will consume current diet without overt s/s of aspiration and Independent use of swallowing compensatory strategies.  SLP Short Term Goal 2 (Week 2): Patient will utilize over-articulation to maximize intelligibility to 100% at the conversation level in a moderately noisy environment with Mod I cues.  SLP Short Term Goal 3 (Week 2): Patient will recall new, daily information with Mod I verbal cues.  SLP Short Term Goal 4 (Week 2): Patient will complete complex problem solving tasks with supervision verbal cues.   Skilled Therapeutic Interventions: Skilled treatment session focused on cognition goals. SLP facilitated session by administering MOCA version 8.1. Pt obtained a score of 24 out of possible 30 points (n=>26). This is great improvement of last MOCA Blind score of 11 out of possible 22 points. Education provided to pt and husband on progress. Pt left in husband's care. Continue per current plan of care.      Function:    Cognition Comprehension Comprehension assist level: Follows complex conversation/direction with extra time/assistive device  Expression   Expression assist level: Expresses complex ideas: With no assist  Social Interaction Social Interaction assist level: Interacts appropriately with others with medication or extra time (anti-anxiety, antidepressant).  Problem Solving Problem solving assist level: Solves complex problems: With extra time  Memory Memory assist level: More than reasonable amount of time    Pain Pain Assessment Pain Assessment: No/denies pain  Therapy/Group: Individual Therapy   Draylon Mercadel B. Rutherford Nail, M.S., [place] 02/17/2017, 12:16 PM

## 2017-02-17 NOTE — Progress Notes (Signed)
Physical Therapy Session Note  Patient Details  Name: Joanne Morrison MRN: [id number] Date of Birth: [birth year]  Today's Date: 02/17/2017 PT Individual Time: 1430-1530 PT Individual Time Calculation (min): 60 min   Short Term Goals: Week 2:  PT Short Term Goal 1 (Week 2): Pt will perform bed mobility with S from flat bed without hospital bed features PT Short Term Goal 2 (Week 2): Pt will ambulate 100' minA with LRAD PT Short Term Goal 3 (Week 2): Pt will ascend/descent four 6" stairs with 1 handrail and modA PT Short Term Goal 4 (Week 2): Pt will demonstrate dynamic standing balance x5 min with close S  Skilled Therapeutic Interventions/Progress Updates: Pt received supine in bed, denies pain and agreeable to treatment. Gait to gym with RW and S x150'. Step ups alternating LEs to 6" step with BUE on parallel bars, x10 reps total. Forward/backward walking in parallel bars no UE support with min guard 3 reps forward and backward, 10' each trial. Sit <>stand from 18" mat height to simulate low toilet; RUE on knee and LUE on RW x10 reps with min guard.  Gait 2x90' with min guard and no AD; no LOBs however do note unsteadiness noted towards end of last session. Sidelying LUE shoulder ER 3x10 reps, straight leg raise abduction 2x20 BLE. Standing horseshoe throws with alternating UEs with min guard and RW for balance. Bridging 2x10 reps. Gait to return to room at end of session with RW and close S. Remained seated in w/c at end of session, all needs in reach.      Therapy Documentation Precautions:  Precautions Precautions: Fall Precaution Comments: trach Restrictions Weight Bearing Restrictions: No   See Function Navigator for Current Functional Status.   Therapy/Group: Individual Therapy  Luberta Mutter 02/17/2017, 3:21 PM

## 2017-02-18 ENCOUNTER — Inpatient Hospital Stay (HOSPITAL_COMMUNITY): Payer: BLUE CROSS/BLUE SHIELD | Admitting: Physical Therapy

## 2017-02-18 ENCOUNTER — Encounter (HOSPITAL_COMMUNITY): Payer: BLUE CROSS/BLUE SHIELD | Admitting: *Deleted

## 2017-02-18 ENCOUNTER — Inpatient Hospital Stay (HOSPITAL_COMMUNITY): Payer: BLUE CROSS/BLUE SHIELD | Admitting: Speech Pathology

## 2017-02-18 ENCOUNTER — Inpatient Hospital Stay (HOSPITAL_COMMUNITY): Payer: BLUE CROSS/BLUE SHIELD | Admitting: Occupational Therapy

## 2017-02-18 NOTE — Patient Care Conference (Signed)
Inpatient RehabilitationTeam Conference and Plan of Care Update Date: 02/18/2017   Time: 7:46 AM    Patient Name: Joanne Morrison      Medical Record Number: [id number]  Date of Birth: 1951-01-16 Sex: Female         Room/Bed: 4W09C/4W09C-01 Payor Info: Payor: MEDICARE / Plan: MEDICARE PART A / Product Type: *No Product type* /    Admitting Diagnosis: GBS  Admit Date/Time:  02/04/2017  4:41 PM Admission Comments: No comment available   Primary Diagnosis:  <principal problem not specified> Principal Problem: <principal problem not specified>  Patient Active Problem List   Diagnosis Date Noted  . Acute blood loss anemia   . Physical deconditioning   . Tracheostomy status (HCC)   . Hypoxemia   . Dysphagia   . Numbness and tingling   . S/P percutaneous endoscopic gastrostomy (PEG) tube placement (HCC)   . Bradycardia   . Left corneal abrasion   . Abnormal chest x-ray   . Acute respiratory failure with hypoxia (HCC)   . Guillain Barr syndrome (HCC)   . Atrial fibrillation with RVR (HCC)   . Dysphagia, pharyngoesophageal phase   . History of ETT   . Encounter for central line placement   . Encounter for feeding tube placement   . Weakness of right lower extremity   . SOB (shortness of breath)   . Pneumonia   . Pressure injury of skin 01/16/2017  . Acute respiratory failure (HCC)   . Hyponatremia with decreased serum osmolality   . Hyponatremia   . Quadriplegia and quadriparesis (HCC) 01/14/2017  . GBS (Guillain Barre syndrome) (HCC) 01/13/2017  . Weakness 01/12/2017  . Hypokalemia 01/12/2017  . Endometriosis 12/05/2013  . Chronic low back pain 08/19/2012  . HYPERLIPIDEMIA 12/10/2010  . COLONIC POLYPS, HX OF 12/13/2009  . HYPERGLYCEMIA, FASTING 11/22/2008  . Depression 02/07/2008  . ANOSMIA 02/07/2008  . HEADACHE, CHRONIC 02/07/2008  . MITRAL VALVE PROLAPSE 10/05/2007  . OSTEOPENIA 10/05/2007    Expected Discharge Date: Expected Discharge Date: 02/20/17  Team Members  Present: Physician leading conference: Dr. Faith Rogue Social Worker Present: Amada Jupiter, LCSW Nurse Present: Chana Bode, RN PT Present: Karolee Stamps, Marcene Brawn, PT OT Present: Johnsie Cancel, OT SLP Present: Reuel Derby, SLP PPS Coordinator present : Tora Duck, RN, CRRN     Current Status/Progress Goal Weekly Team Focus  Medical   improving motor skills and strength. still some distal sensory loss  improve balance, functional mobility  nutrition, bp control   Bowel/Bladder   continent of b&b, LBM 02/15/17  maintain regular bowel pattern  monitor b/b q shift and prn   Swallow/Nutrition/ Hydration   Mod I - goal met  Mod I  goal met with regular diet textures and thin liquids   ADL's   Set-up UB bathing/dressing; supervision- min A LB bathing/ dressing; Supervision- min A functional transfers and ambulation  Supervision overall  ADL re-training; UE strengthening/ endurance/ coordination; functional standing balance/ endurance; d/c planning   Mobility   modI bed mobility, S transfers, min guard gait with RW >150', min/modA stairs  modI transfers, S overall, minA stairs  UE/LE NMR, strengthening, gait/stair training, activity tolerance   Communication   Mod I simple conversation - goal met  Mod I simple conversation  use of intelligibility strategies at the simple conversation level   Safety/Cognition/ Behavioral Observations  Supervision with complex problem solving  Mod I  Complex problem solving   Pain   mild back pain managed with lidocaine patch,  pt has minor generalized pain but states "I don't want to take too many pills",gabapentin started 3/5, pt refuses gabapentin due to adverse reaction in the past, felt cognitive changes and lightheaded feeling.  pain < or = to 4  assess pain q shift and prn   Skin    (itching addressed 3/5, benadryl/sarna lotion)  maintain skin s breakdown  monitor q shift and prn    Rehab Goals Patient on target to meet rehab goals:  Yes *See Care Plan and progress notes for long and short-term goals.  Barriers to Discharge: sensory loss in feet    Possible Resolutions to Barriers:  adaptive techniques and equipment, family and pt ed    Discharge Planning/Teaching Needs:  Pt to d/c home with spouse to provide and arrange 24/7 assistance.  Teaching has been ongoing with spouse as he is here and involved daily.   Team Discussion:  Making good neurological progress.  Reaching supervision goals overall.  Planning to go on outing tomorrow.  Much improved cognitively.  Recommend moving up d/c date to end of week 3/9.  Revisions to Treatment Plan:  Change in d/c date.   Continued Need for Acute Rehabilitation Level of Care: The patient requires daily medical management by a physician with specialized training in physical medicine and rehabilitation for the following conditions: Daily direction of a multidisciplinary physical rehabilitation program to ensure safe treatment while eliciting the highest outcome that is of practical value to the patient.: Yes Daily medical management of patient stability for increased activity during participation in an intensive rehabilitation regime.: Yes Daily analysis of laboratory values and/or radiology reports with any subsequent need for medication adjustment of medical intervention for : Neurological problems;Wound care problems  Joanne Morrison 02/18/2017, 7:46 AM

## 2017-02-18 NOTE — Progress Notes (Signed)
Joanne Morrison PHYSICAL MEDICINE & REHABILITATION     PROGRESS NOTE    Subjective/Complaints: Up in bathroom. No new issues. Pain controlled.   ROS: pt denies nausea, vomiting, diarrhea, cough, shortness of breath or chest pain    Objective: Vital Signs: Blood pressure (!) 164/97, pulse 92, temperature 98.4 F (36.9 C), temperature source Oral, resp. rate 17, weight 63.6 kg (140 lb 3.4 oz), SpO2 96 %. No results found.  Recent Labs  02/16/17 0551  WBC 4.0  HGB 11.2*  HCT 35.8*  PLT 180    Recent Labs  02/16/17 0551  NA 141  K 3.1*  CL 104  GLUCOSE 98  BUN 5*  CREATININE 0.68  CALCIUM 9.1   CBG (last 3)  No results for input(s): GLUCAP in the last 72 hours.  Wt Readings from Last 3 Encounters:  02/18/17 63.6 kg (140 lb 3.4 oz)  02/04/17 68.1 kg (150 lb 2.1 oz)  10/15/16 72.7 kg (160 lb 5 oz)    Physical Exam:  Constitutional: She appears well-developed and well-nourished. NAD.  HENT: Normocephalic. Atraumatic.  Eyes: Left lid with gauze/sutures--stable Neck: trach scar Cardiovascular: RRR. No JVD.  Respiratory: CTA B. Unlabored.  GI: Soft. Bowel sounds are normal. +PEG Musculoskeletal: She exhibits no edema or tenderness.  Neurological: She is alert and oriented.  Motor:  LUE: 3/5 proximally, 4/5 distally   LLE:  4+/5 proximally, 4+/5 distally RUE: 4-/5 proximal to 4/5 distal RLE: 4+/5 proximally, 4+/5 distally---stable Decreased sensation over soles of feet.  left CN 7 injury Skin: Skin is warm and dry. No rash Psychiatric: Normal mood and behavior.   Assessment/Plan: 1. 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 Physiatrist is providing close team supervision and 24 hour management of active medical problems listed below. Physiatrist and rehab team continue to assess barriers to discharge/monitor patient progress toward functional and medical  goals.  Function:  Bathing Bathing position   Position: Shower  Bathing parts Body parts bathed by patient: Right arm, Left arm, Chest, Abdomen, Front perineal area, Right upper leg, Left upper leg, Buttocks, Right lower leg, Left lower leg Body parts bathed by helper: Back  Bathing assist Assist Level: Supervision or verbal cues      Upper Body Dressing/Undressing Upper body dressing   What is the patient wearing?: Pull over shirt/dress Bra - Perfomed by patient: Thread/unthread right bra strap, Thread/unthread left bra strap Bra - Perfomed by helper: Hook/unhook bra (pull down sports bra) Pull over shirt/dress - Perfomed by patient: Thread/unthread right sleeve, Thread/unthread left sleeve, Put head through opening, Pull shirt over trunk Pull over shirt/dress - Perfomed by helper: Put head through opening, Pull shirt over trunk        Upper body assist Assist Level: Set up   Set up : To obtain clothing/put away  Lower Body Dressing/Undressing Lower body dressing   What is the patient wearing?: [employer], Underwear, Shoes, Socks Underwear - Performed by patient: Thread/unthread right underwear leg, Thread/unthread left underwear leg, Pull underwear up/down Underwear - Performed by helper: Thread/unthread left underwear leg, Pull underwear up/down Pants- Performed by patient: Thread/unthread right pants leg, Thread/unthread left pants leg, Pull pants up/down Pants- Performed by helper: Thread/unthread left pants leg, Pull pants up/down Non-skid slipper socks- Performed by patient: Don/doff right sock, Don/doff left sock   Socks - Performed by patient: Don/doff right sock, Don/doff left sock Socks - Performed by helper: Don/doff right sock,  Don/doff left sock Shoes - Performed by patient: Don/doff right shoe, Don/doff left shoe Shoes - Performed by helper: Don/doff right shoe, Don/doff left shoe, Fasten right, Fasten left       TED Hose - Performed by helper: Don/doff right TED  hose, Don/doff left TED hose  Lower body assist Assist for lower body dressing: Supervision or verbal cues      Toileting Toileting   Toileting steps completed by patient: Adjust clothing prior to toileting, Performs perineal hygiene, Adjust clothing after toileting Toileting steps completed by helper: Adjust clothing prior to toileting, Performs perineal hygiene, Adjust clothing after toileting Toileting Assistive Devices: Grab bar or rail  Toileting assist Assist level: Supervision or verbal cues   Transfers Chair/bed transfer Chair/bed transfer activity did not occur: Safety/medical concerns Chair/bed transfer method: Ambulatory Chair/bed transfer assist level: Supervision or verbal cues Chair/bed transfer assistive device: Bedrails, Walker     Locomotion Ambulation Ambulation activity did not occur: Safety/medical concerns   Max distance: 90 Assist level: Touching or steadying assistance (Pt > 75%)   Wheelchair   Type: Manual Max wheelchair distance: 100 Assist Level: Supervision or verbal cues  Cognition Comprehension Comprehension assist level: Follows complex conversation/direction with extra time/assistive device  Expression Expression assist level: Expresses complex ideas: With no assist  Social Interaction Social Interaction assist level: Interacts appropriately with others with medication or extra time (anti-anxiety, antidepressant).  Problem Solving Problem solving assist level: Solves complex problems: With extra time  Memory Memory assist level: More than reasonable amount of time   Medical Problem List and Plan: 1.  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 IVIG course completed. No current plans for plasma exchange  -continue CIR   -making motor/functional gains  -working toward Friday discharge 2.  DVT Prophylaxis/Anticoagulation: Subcutaneous heparin.  Vascular study neg for DVT 3. Pain Management: Lidoderm patch, Duragesic patch--decrease prior to  discharge?  -pain remains controlled 4. Mood: Zoloft 50 mg daily, Ativan as needed 5. Neuropsych: This patient is capable of making decisions on her own behalf. 6. Skin/Wound Care: Routine skin checks 7. Fluids/Electrolytes/Nutrition:   -replete K+, 3.1 8. Tracheostomy 01/23/2017. Discontinued on 02/11/2017 9. Gastrostomy tube 01/26/2017 per interventional radiology. Diet advanced to regular 02/04/2017 after modified barium swallow Follow-up speech therapy,     -remove Gastrostomy tube at end of month 10. Atrial fibrillation/bradycardia. Cardiac rate controlled at present. Follow-up with cardiology services Vitals:   02/17/17 1500 02/18/17 0508  BP: 139/75 (!) 164/97  Pulse: 75 92  Resp: 18 17  Temp: 98.2 F (36.8 C) 98.4 F (36.9 C)   11. 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 Underwent tarorhaphy 02/04/2017. Continue erythromycin ointment for now follow-up ophthalmology services as needed, Contacted ophthalmology advised continued eyedrops.  -eye clean/decreased irritation and drainage 12. ABLA  Hb 11.2 on 2/22  Holding at 11.2    LOS (Days) 14 A FACE TO FACE EVALUATION WAS PERFORMED  Meredith Staggers, MD 02/18/2017 9:02 AM

## 2017-02-18 NOTE — Progress Notes (Signed)
Occupational Therapy Session Note  Patient Details  Name: Joanne Morrison MRN: [id number] Date of Birth: [birth year]  Today's Date: 02/18/2017 OT Individual Time: 1030-1200 OT Individual Time Calculation (min): 90 min    Short Term Goals: Week 2:  OT Short Term Goal 1 (Week 2): STG=LTG due to LOS  Skilled Therapeutic Interventions/Progress Updates:    Pt seen for community outing during co-tx with recreational therapist. Functional transfers completed throughout session with supervision via stand pivot. She ambulated in community environment with use of rollator- required VCs throughout for management of rollator brakes during functional tasks. Pt able to self initiate rest breaks throughout. She utilizing B UEs to obtain items from shelves, occasional VCs given for awareness/ problem solving. Pt's husband Accompanied pt during outing and educated throughout regarding recommendations and best ways to assist pt, energy conservation, and problem solving accessibility of grocery store. Practiced car transfer with personal vehichle. Completed x3 trials practicing various techniques- completed at supervision level with VCs for technique. Pt returned to unit and left seated in w/c with husband present, all needs in reach.   See goal sheet in shadow chart for goal details- all outing goals met.    Therapy Documentation Precautions:  Precautions Precautions: Fall Precaution Comments: trach Restrictions Weight Bearing Restrictions: No Pain:   No/ denies pain ADL: ADL ADL Comments: refer to functional navigator  See Function Navigator for Current Functional Status.   Therapy/Group: Individual Therapy  Lewis, Verginia Toohey C 02/18/2017, 4:01 PM

## 2017-02-18 NOTE — Progress Notes (Signed)
Physical Therapy Session Note  Patient Details  Name: Joanne Morrison MRN: [id number] Date of Birth: 1951-03-12  Today's Date: 02/18/2017 PT Individual Time: 0800-0915 PT Individual Time Calculation (min): 75 min   Short Term Goals: Week 2:  PT Short Term Goal 1 (Week 2): Pt will perform bed mobility with S from flat bed without hospital bed features PT Short Term Goal 2 (Week 2): Pt will ambulate 100' minA with LRAD PT Short Term Goal 3 (Week 2): Pt will ascend/descent four 6" stairs with 1 handrail and modA PT Short Term Goal 4 (Week 2): Pt will demonstrate dynamic standing balance x5 min with close S  Skilled Therapeutic Interventions/Progress Updates: Pt received seated in w/c with husband present, denies pain and agreeable to treatment. While pt finishing breakfast, discussed PT goals for outing. Pt used restroom and performed lower body dressing with husband providing setupA>minA to prepare for d/c home. Gait with rollator 2x150' with close S; pt performed turn to sit in rollator with close S and min cues for locking brakes. Pt reports liking rollator however does not feel it will be functional in the home due to large size compared to RW and would prefer to have RW for home, w/c for community. Will communicate with CSW. Performed car transfer at small SUV seat height, ramp and mulch all with rollator and close S. Standing balance wii games with BUE support on rollator and close S while using balance board and standing on level floor. Gait to return to room with rollator and S. Remained seated in w/c at end of session, all needs in reach.      Therapy Documentation Precautions:  Precautions Precautions: Fall Precaution Comments: trach Restrictions Weight Bearing Restrictions: No   See Function Navigator for Current Functional Status.   Therapy/Group: Individual Therapy  Luberta Mutter 02/18/2017, 9:10 AM

## 2017-02-18 NOTE — Progress Notes (Signed)
Occupational Therapy Session Note  Patient Details  Name: Joanne Morrison MRN: [id number] Date of Birth: 05-Jul-1951  Today's Date: 02/18/2017 OT Individual Time: 1660-6004 OT Individual Time Calculation (min): 26 min    Short Term Goals: Week 2:  OT Short Term Goal 1 (Week 2): STG=LTG due to LOS  Skilled Therapeutic Interventions/Progress Updates:    Pt completed functional mobility to the therapy gym with supervision using the RW.  Pt engaged in use of the Nustep to increase endurance and LE strengthening for greater independence with selfcare tasks and functional transfers.  She was able to complete 5 mins using BUEs and BLEs and resistance set on level 6.  She averaged 64 steps per minute with pt reporting level 6 on the Berg Exertion Scale.  After brief rest break she completed second set of 5 mins with use of just the LEs and resistance at the same level.  She was able to average 42 steps per minute.  HR increased to 118 with O2 sats at 96-97% after completion of set with perceived exertion at level 15 on the Borg scale.  After brief rest she ambulated back to her room with close supervision again and was left sitting in her wheelchair with spouse present.    Therapy Documentation Precautions:  Precautions Precautions: Fall Precaution Comments:   Restrictions Weight Bearing Restrictions: No  Vital Signs: Pulse Rate: (!) 118 (with activity) Patient Position (if appropriate): Sitting Oxygen Therapy SpO2: 96 % O2 Device: Not Delivered Pulse Oximetry Type: Intermittent Pain: Pain Assessment Pain Assessment: No/denies pain ADL: See Function Navigator for Current Functional Status.   Therapy/Group: Individual Therapy  Joanne Morrison OTR/L 02/18/2017, 4:23 PM

## 2017-02-18 NOTE — Progress Notes (Signed)
Nutrition Follow-up  DOCUMENTATION CODES:   Not applicable  INTERVENTION:  Continue Ensure Enlive po once daily, each supplement provides 350 kcal and 20 grams of protein.  Encourage adequate PO intake.   NUTRITION DIAGNOSIS:   Inadequate oral intake related to poor appetite as evidenced by meal completion < 50%; improved  GOAL:   Patient will meet greater than or equal to 90% of their needs; met  MONITOR:   PO intake, Supplement acceptance, Labs, Weight trends, Skin, I & O's  REASON FOR ASSESSMENT:   Consult Calorie Count  ASSESSMENT:   66 y.o. right handed female with history of Chronic cystitis, hypertension, depression chronic low back pain. Patient to be areflexic of the lower extremities suspect GBS. On 01/15/2017 develop increasing weakness and difficulty with secretions pulmonary care medicine consulted for deterioration in respiratory status. She was intubated with ventilatory support. Prolonged intubation underwent tracheostomy 01/23/2017 per Dr. Nelda Marseille as well as placement of gastrostomy tube 01/26/2017 per interventional radiology and  barium swallow 02/04/2017 with diet advanced to regular. Trach discontinued 2/28. Plan for PEG to be removed at the end of the month.    Meal completion has been 70-100% most recently. Intake has been adequate. Pt currently has Ensure ordered. RD to continue with current orders to aid in adequate nutrition. Plans for discharge Friday.   Diet Order:  Diet regular Room service appropriate? Yes; Fluid consistency: Thin  Skin:  Reviewed, no issues  Last BM:  3/4  Height:   Ht Readings from Last 1 Encounters:  01/15/17 5' 2"  (1.575 m)    Weight:   Wt Readings from Last 1 Encounters:  02/18/17 140 lb 3.4 oz (63.6 kg)    Ideal Body Weight:  50 kg  BMI:  Body mass index is 25.65 kg/m.  Estimated Nutritional Needs:   Kcal:  1700-1900  Protein:  80-90 grams  Fluid:  1.7 - 1.9 L/day  EDUCATION NEEDS:   No education  needs identified at this time  Corrin Parker, MS, RD, LDN Pager # [phone] After hours/ weekend pager # [phone]

## 2017-02-19 ENCOUNTER — Inpatient Hospital Stay (HOSPITAL_COMMUNITY): Payer: BLUE CROSS/BLUE SHIELD | Admitting: Physical Therapy

## 2017-02-19 ENCOUNTER — Inpatient Hospital Stay (HOSPITAL_COMMUNITY): Payer: BLUE CROSS/BLUE SHIELD | Admitting: Occupational Therapy

## 2017-02-19 ENCOUNTER — Inpatient Hospital Stay (HOSPITAL_COMMUNITY): Payer: BLUE CROSS/BLUE SHIELD | Admitting: Speech Pathology

## 2017-02-19 MED ORDER — FENTANYL 25 MCG/HR TD PT72
25.0000 ug | MEDICATED_PATCH | TRANSDERMAL | Status: DC
  Administered 2017-02-19: 25 ug via TRANSDERMAL
  Filled 2017-02-19: qty 1

## 2017-02-19 NOTE — Progress Notes (Signed)
Occupational Therapy Discharge Summary  Patient Details  Name: Joanne Morrison MRN: [id number] Date of Birth: 05/11/1951   Patient has met 26 of 10 long term goals due to improved activity tolerance, improved balance, postural control, ability to compensate for deficits, functional use of  RIGHT upper and LEFT upper extremity and improved coordination.  Patient to discharge at overall Supervision level.  Patient's care partner is independent to provide the necessary physical assistance at discharge.   Pt made excellent progress while on CIR. Her husband was present for all tx sessions and very involved in pt's care. He demonstrates and voices able and willingness to provide all needed assist.    Recommendation:  Patient will benefit from ongoing skilled OT services in outpatient setting to continue to advance functional skills in the area of BADL, iADL and Reduce care partner burden.  Equipment: Pt privately purchasing tub transfer bench  Reasons for discharge: treatment goals met and discharge from hospital  Patient/family agrees with progress made and goals achieved: Yes  OT Discharge Precautions/Restrictions  Precautions Precautions: Fall Restrictions Weight Bearing Restrictions: No ADL ADL ADL Comments: refer to functional navigator Vision/Perception  Vision- History Baseline Vision/History: Wears glasses Wears Glasses: Reading only Patient Visual Report: Other (comment) (R eye only; L eye with splint keeping eye closed due to cornea abrasion) Vision- Assessment Additional Comments: L eye sutured  Cognition Overall Cognitive Status: Impaired/Different from baseline Arousal/Alertness: Awake/alert Orientation Level: Oriented X4 Attention: Selective Sustained Attention: Appears intact Selective Attention: Appears intact Memory: Impaired Memory Impairment: Decreased short term memory;Storage deficit;Decreased recall of new information Decreased Short Term Memory: Verbal  complex;Functional complex Awareness: Appears intact Problem Solving: Appears intact Safety/Judgment: Appears intact Sensation Sensation Light Touch: Impaired Detail Light Touch Impaired Details: Impaired RUE;Impaired LUE (Finger tips "feel tingly") Coordination Gross Motor Movements are Fluid and Coordinated: No Fine Motor Movements are Fluid and Coordinated: No Coordination and Movement Description: Generalized weakness with compensatory motor movements Motor  Motor Motor: Other (comment) Motor - Skilled Clinical Observations: Generalized weakness, however, much improved since admission Trunk/Postural Assessment  Cervical Assessment Cervical Assessment: Within Functional Limits Thoracic Assessment Thoracic Assessment: Within Functional Limits Lumbar Assessment Lumbar Assessment: Within Functional Limits Postural Control Postural Control: Deficits on evaluation Trunk Control: delayed/inefficient stepping strategies  Balance Balance Balance Assessed: Yes Static Sitting Balance Static Sitting - Balance Support: Feet supported;No upper extremity supported Static Sitting - Level of Assistance: 7: Independent Dynamic Sitting Balance Dynamic Sitting - Balance Support: Feet supported;No upper extremity supported Dynamic Sitting - Level of Assistance: 6: Modified independent (Device/Increase time) Static Standing Balance Static Standing - Balance Support: During functional activity Static Standing - Level of Assistance: 6: Modified independent (Device/Increase time) Dynamic Standing Balance Dynamic Standing - Balance Support: During functional activity Dynamic Standing - Level of Assistance: 5: Stand by assistance Extremity/Trunk Assessment RUE Assessment RUE Assessment: Exceptions to [facility] RUE AROM (degrees) RUE Overall AROM Comments: WNL- 180 degrees RUE Strength RUE Overall Strength Comments: 4+/5 throughout, WNL gross grasp LUE Assessment LUE Assessment: Exceptions to  WFL LUE AROM (degrees) LUE Overall AROM Comments: WNL in all joints LUE Strength LUE Overall Strength Comments: 4/5 throughout slightly deminished gross grasp; uses compensatory strategies for elbow flexion/extension despite WNL strength   See Function Navigator for Current Functional Status.  Joanne Morrison, Joanne Morrison C 02/19/2017, 3:38 PM

## 2017-02-19 NOTE — Discharge Summary (Signed)
Discharge summary job [id number]

## 2017-02-19 NOTE — Progress Notes (Signed)
[place] PHYSICAL MEDICINE & REHABILITATION     PROGRESS NOTE    Subjective/Complaints: Eating breakfast. Still complaining of a lot of itching particularly on back  ROS: pt denies nausea, vomiting, diarrhea, cough, shortness of breath or chest pain    Objective: Vital Signs: Blood pressure (!) 146/76, pulse 88, temperature 98.3 F (36.8 C), temperature source Oral, resp. rate 18, weight 64 kg (141 lb 1.5 oz), SpO2 97 %. No results found. No results for input(s): WBC, HGB, HCT, PLT in the last 72 hours. No results for input(s): NA, K, CL, GLUCOSE, BUN, CREATININE, CALCIUM in the last 72 hours.  Invalid input(s): CO CBG (last 3)  No results for input(s): GLUCAP in the last 72 hours.  Wt Readings from Last 3 Encounters:  02/19/17 64 kg (141 lb 1.5 oz)  02/04/17 68.1 kg (150 lb 2.1 oz)  10/15/16 72.7 kg (160 lb 5 oz)    Physical Exam:  Constitutional: She appears well-developed and well-nourished. NAD.  HENT: Normocephalic. Atraumatic.  Eyes: Left lid with gauze/sutures--clean Neck: trach scar Cardiovascular: RRR. No JVD.  Respiratory: CTA B. Unlabored.  GI: Soft. Bowel sounds are normal. +PEG Musculoskeletal: She exhibits no edema or tenderness.  Neurological: She is alert and oriented.  Motor:  LUE: 3/5 proximally, 4/5 distally   LLE:  4+/5 proximally, 4+/5 distally RUE: 4-/5 proximal to 4/5 distal RLE: 4+/5 proximally, 4+/5 distally---stable Decreased sensation over soles of feet.  left CN 7 injury Skin: Skin is warm and dry. Irritation on back from scratching. Don't see frank rash Psychiatric: Normal mood and behavior.   Assessment/Plan: 1. 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 Physiatrist is providing close team supervision and 24 hour management of active medical problems listed below. Physiatrist and rehab team continue to assess barriers to discharge/monitor  patient progress toward functional and medical goals.  Function:  Bathing Bathing position   Position: Shower  Bathing parts Body parts bathed by patient: Right arm, Left arm, Chest, Abdomen, Front perineal area, Right upper leg, Left upper leg, Buttocks, Right lower leg, Left lower leg Body parts bathed by helper: Back  Bathing assist Assist Level: Supervision or verbal cues      Upper Body Dressing/Undressing Upper body dressing   What is the patient wearing?: Pull over shirt/dress Bra - Perfomed by patient: Thread/unthread right bra strap, Thread/unthread left bra strap Bra - Perfomed by helper: Hook/unhook bra (pull down sports bra) Pull over shirt/dress - Perfomed by patient: Thread/unthread right sleeve, Thread/unthread left sleeve, Put head through opening, Pull shirt over trunk Pull over shirt/dress - Perfomed by helper: Put head through opening, Pull shirt over trunk        Upper body assist Assist Level: Set up   Set up : To obtain clothing/put away  Lower Body Dressing/Undressing Lower body dressing   What is the patient wearing?: [employer], Underwear, Shoes, Socks Underwear - Performed by patient: Thread/unthread right underwear leg, Thread/unthread left underwear leg, Pull underwear up/down Underwear - Performed by helper: Thread/unthread left underwear leg, Pull underwear up/down Pants- Performed by patient: Thread/unthread right pants leg, Thread/unthread left pants leg, Pull pants up/down Pants- Performed by helper: Thread/unthread left pants leg, Pull pants up/down Non-skid slipper socks- Performed by patient: Don/doff right sock, Don/doff left sock   Socks - Performed by patient: Don/doff right sock, Don/doff left sock Socks - Performed by helper: Don/doff right sock, Don/doff left sock Shoes - Performed  by patient: Don/doff right shoe, Don/doff left shoe Shoes - Performed by helper: Don/doff right shoe, Don/doff left shoe, Fasten right, Fasten left       TED  Hose - Performed by helper: Don/doff right TED hose, Don/doff left TED hose  Lower body assist Assist for lower body dressing: Supervision or verbal cues      Toileting Toileting   Toileting steps completed by patient: Adjust clothing prior to toileting, Performs perineal hygiene, Adjust clothing after toileting Toileting steps completed by helper: Adjust clothing prior to toileting, Performs perineal hygiene, Adjust clothing after toileting Toileting Assistive Devices: Grab bar or rail  Toileting assist Assist level: Supervision or verbal cues   Transfers Chair/bed transfer Chair/bed transfer activity did not occur: Safety/medical concerns Chair/bed transfer method: Ambulatory Chair/bed transfer assist level: Supervision or verbal cues Chair/bed transfer assistive device: Bedrails, Walker     Locomotion Ambulation Ambulation activity did not occur: Safety/medical concerns   Max distance: 175 Assist level: Supervision or verbal cues   Wheelchair   Type: Manual Max wheelchair distance: 100 Assist Level: Supervision or verbal cues  Cognition Comprehension Comprehension assist level: Follows complex conversation/direction with extra time/assistive device  Expression Expression assist level: Expresses complex ideas: With extra time/assistive device  Social Interaction Social Interaction assist level: Interacts appropriately with others with medication or extra time (anti-anxiety, antidepressant).  Problem Solving Problem solving assist level: Solves complex problems: With extra time  Memory Memory assist level: More than reasonable amount of time   Medical Problem List and Plan: 1.  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 IVIG course completed. No current plans for plasma exchange  -continue CIR   -home tomorrow! 2.  DVT Prophylaxis/Anticoagulation: Subcutaneous heparin.  Vascular study neg for DVT 3. Pain Management: Lidoderm patch, Duragesic patch--decrease prior to  discharge?  -pain remains controlled 4. Mood: Zoloft 50 mg daily, Ativan as needed 5. Neuropsych: This patient is capable of making decisions on her own behalf. 6. Skin/Wound Care: Routine skin checks 7. Fluids/Electrolytes/Nutrition:   -repleted K+,  8. Tracheostomy 01/23/2017. Discontinued on 02/11/2017 9. Gastrostomy tube 01/26/2017 per interventional radiology. Diet advanced to regular 02/04/2017 after modified barium swallow Follow-up speech therapy,     -remove Gastrostomy tube at end of month 10. Atrial fibrillation/bradycardia. Cardiac rate controlled at present. Follow-up with cardiology services Vitals:   02/18/17 1623 02/19/17 0555  BP:  (!) 146/76  Pulse: (!) 118 88  Resp:  18  Temp:  98.3 F (36.8 C)   11. 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 Underwent tarorhaphy 02/04/2017. Continue erythromycin ointment for now follow-up ophthalmology services as needed, Contacted ophthalmology advised continued eyedrops.  -eye clean/decreased irritation and drainage 12. ABLA  Hb 11.2 on 2/22  Holding at 11.2   13. Pruritus: fentanyl related? Vs contact/heat rash  -decrease to 78mcg  -double sheet bed  LOS (Days) 15 A FACE TO FACE EVALUATION WAS PERFORMED  Alger Simons T, MD 02/19/2017 9:00 AM

## 2017-02-19 NOTE — Progress Notes (Signed)
Speech Language Pathology Discharge Summary  Patient Details  Name: Joanne Morrison MRN: [id number] Date of Birth: July 08, 1951  Today's Date: 02/19/2017 SLP Individual Time: 0830-0900 SLP Individual Time Calculation (min): 30 min   Skilled Therapeutic Interventions:  Skilled treatment session focused on cognition goals. SLP facilitated session by providing supervision with completion of complex problem solving tasks. Education provided about follow up Outpatient services and targeting facial strengthening exercises as well as follow up with neuro-pyscologist. Pt left upright in wheelchair with husband present.     Patient has met 4 of 4 long term goals.  Patient to discharge at overall Modified Independent level.  Reasons goals not met:     Clinical Impression/Discharge Summary:    Pt with great progress in ST sessions. As a result she has met her goals and is discharging at Mod I level. All education completed and all questions answered to pt and husband satisfaction.   Care Partner:  Caregiver Able to Provide Assistance: Yes     Recommendation:  Outpatient SLP  Rationale for SLP Follow Up: Maximize cognitive function and independence;Reduce caregiver burden   Equipment: [name]   Reasons for discharge: Treatment goals met   Patient/Family Agrees with Progress Made and Goals Achieved: Yes   Function:  Eating Eating     Eating Assist Level: Swallowing techniques: self managed;Set up assist for           Cognition Comprehension Comprehension assist level: Follows complex conversation/direction with extra time/assistive device  Expression   Expression assist level: Expresses complex ideas: With extra time/assistive device  Social Interaction Social Interaction assist level: Interacts appropriately with others with medication or extra time (anti-anxiety, antidepressant).  Problem Solving Problem solving assist level: Solves complex problems: With extra time  Memory Memory  assist level: More than reasonable amount of time    Denilson Salminen B. Rutherford Nail, M.S., CCC-SLP Speech-Language Pathologist  Vaibhav Fogleman 02/19/2017, 12:44 PM

## 2017-02-19 NOTE — Progress Notes (Signed)
Occupational Therapy Session Note  Patient Details  Name: Joanne Morrison MRN: [id number] Date of Birth: 06/09/1951  Today's Date: 02/19/2017 OT Individual Time: 0930-1045 and 1400-1500 OT Individual Time Calculation (min): 75 min and 60 min   Short Term Goals: Week 2:  OT Short Term Goal 1 (Week 2): STG=LTG due to LOS  Skilled Therapeutic Interventions/Progress Updates:    Session One: Pt seen for OT ADL bathing/dressing session. Pt in supine upon arrival with husband present, agreeable to tx session. Pt ambulated with RW to gather clothing with supervision.  She ambulated to ADL apartment with supervision and completed all functional transfers with supervision and VCs throughout for hand placement on RW and reaching back when sitting. Pt dressed seated in w/c with supervision, sit <> stand at [facility] for clothing management. She ambulated back to room with supervision assist from husband. Completed standing grooming task to brush hair and then sat to blow dry hair, utilizing B UEs in functional manner to complete task.  Pt left seated in w/c at end of session, all needs in reach.  Pt's husband voiced feeling ready and excited for d/c home tomorrow, pt also very excited and ready. All questions answered. Educated regarding therapy recommendations and follow up therapies.   Session Two: Pt seen for OT session focusing on IADL retaining with cooking activity and emphasis on UE utilization and activity tolerance. Pt sitting up in w/c upon arrival, ready for tx session. Pt's husband present and active during tx session. She self propelled w/c to ADL apartment for UE strengthening with supervision. Completed cooking task utilizing RW and overall supervision. VCs provided throughout for RW management during functional tasks and safety awareness with decreased sensation in UEs. She required seated rest breaks throughout and alternated sitting and standing to complete cooking task. Educated regarding energy  conservation during IADL and ADL tasks. Pt self propelled w/c back to room at end of session mod I. Pt left in room with husband present.   Therapy Documentation Precautions:  Precautions Precautions: Fall Precaution Comments:   Restrictions Weight Bearing Restrictions: No Pain:   No/ denies pain ADL: ADL ADL Comments: refer to functional navigator  See Function Navigator for Current Functional Status.   Therapy/Group: Individual Therapy  Lewis, Brocha Gilliam C 02/19/2017, 7:08 AM

## 2017-02-19 NOTE — Progress Notes (Signed)
Physical Therapy Discharge Summary  Patient Details  Name: Joanne Morrison MRN: [id number] Date of Birth: 30-Dec-1950  Today's Date: 02/19/2017 PT Individual Time: 1100-1200 PT Individual Time Calculation (min): 60 min    Patient has met 10 of 10 long term goals due to improved activity tolerance, improved balance, improved postural control, increased strength, decreased pain, ability to compensate for deficits, functional use of  right upper extremity, right lower extremity, left upper extremity and left lower extremity, improved attention, improved awareness and improved coordination.  Patient to discharge at an ambulatory level Supervision.   Patient's care partner is independent to provide the necessary physical and cognitive assistance at discharge.  Reasons goals not met: All goals met  Recommendation:  Patient will benefit from ongoing skilled PT services in outpatient setting to continue to advance safe functional mobility, address ongoing impairments in strength, balance, coordination, activity tolerance, and minimize fall risk.  Equipment: W/c, RW  Reasons for discharge: treatment goals met and discharge from hospital  Patient/family agrees with progress made and goals achieved: Yes  PT Discharge Precautions/Restrictions Restrictions Weight Bearing Restrictions: No Pain Pain Assessment Pain Assessment: No/denies pain Vision/Perception  Vision - Assessment Additional Comments: L eye sutured  Cognition Overall Cognitive Status: Impaired/Different from baseline Arousal/Alertness: Awake/alert Orientation Level: Oriented X4 Attention: Selective Sustained Attention: Appears intact Selective Attention: Appears intact Memory: Impaired Memory Impairment: Decreased short term memory;Storage deficit;Decreased recall of new information Decreased Short Term Memory: Verbal complex;Functional complex Awareness: Appears intact Problem Solving: Appears intact Safety/Judgment:  Appears intact Sensation Sensation Light Touch: Impaired Detail Light Touch Impaired Details: Impaired RUE;Impaired LUE (Finger tips "feel tingly") Coordination Gross Motor Movements are Fluid and Coordinated: No Fine Motor Movements are Fluid and Coordinated: No Coordination and Movement Description: Generalized weakness with compensatory motor movements Motor  Motor Motor: Other (comment) Motor - Skilled Clinical Observations: Generalized weakness, however, much improved since admission  Mobility Bed Mobility Bed Mobility: Supine to Sit;Sit to Supine Supine to Sit: 6: Modified independent (Device/Increase time) Sit to Supine: 6: Modified independent (Device/Increase time) Transfers Transfers: Yes Sit to Stand: 5: Supervision Sit to Stand Details: Verbal cues for precautions/safety Stand to Sit: 5: Supervision Stand to Sit Details (indicate cue type and reason): Verbal cues for precautions/safety Stand Pivot Transfers: 5: Supervision;With armrests Stand Pivot Transfer Details: Verbal cues for precautions/safety Locomotion  Ambulation Ambulation: Yes Ambulation/Gait Assistance: 5: Supervision Assistive device: Rolling walker Ambulation/Gait Assistance Details: Verbal cues for technique;Verbal cues for precautions/safety Gait Gait: Yes Gait Pattern: Impaired Gait Pattern: Poor foot clearance - right;Poor foot clearance - left Gait velocity: decreased for age/gender norms Stairs / Additional Locomotion Stairs: Yes Stairs Assistance: 4: Min guard Stairs Assistance Details: Verbal cues for sequencing;Verbal cues for technique;Verbal cues for precautions/safety;Verbal cues for gait pattern Stair Management Technique: Two rails;Forwards;Alternating pattern Number of Stairs: 12 Height of Stairs: 6 Ramp: 5: Supervision Curb: 4: Min assist;5: Psychiatric nurse: Yes Wheelchair Assistance: 5: Investment banker, operational Details: Verbal  cues for technique;Verbal cues for Information systems manager: Both upper extremities Wheelchair Parts Management: Independent Distance: >200'  Trunk/Postural Assessment  Cervical Assessment Cervical Assessment: Within Functional Limits Thoracic Assessment Thoracic Assessment: Within Functional Limits Lumbar Assessment Lumbar Assessment: Within Functional Limits Postural Control Postural Control: Deficits on evaluation Trunk Control: delayed/inefficient stepping strategies  Balance Balance Balance Assessed: Yes Standardized Balance Assessment Standardized Balance Assessment: Berg Balance Test;Timed Up and Go Test Berg Balance Test Sit to Stand: Able to stand without using hands and stabilize independently Standing Unsupported: Able to  stand 2 minutes with supervision Sitting with Back Unsupported but Feet Supported on Floor or Stool: Able to sit safely and securely 2 minutes Stand to Sit: Sits safely with minimal use of hands Transfers: Able to transfer safely, minor use of hands Standing Unsupported with Eyes Closed: Able to stand 10 seconds with supervision Standing Ubsupported with Feet Together: Able to place feet together independently and stand for 1 minute with supervision From Standing, Reach Forward with Outstretched Arm: Reaches forward but needs supervision From Standing Position, Pick up Object from Floor: Able to pick up shoe, needs supervision From Standing Position, Turn to Look Behind Over each Shoulder: Looks behind from both sides and weight shifts well Turn 360 Degrees: Able to turn 360 degrees safely but slowly Standing Unsupported, Alternately Place Feet on Step/Stool: Able to complete >2 steps/needs minimal assist Standing Unsupported, One Foot in Front: Able to plae foot ahead of the other independently and hold 30 seconds Standing on One Leg: Unable to try or needs assist to prevent fall Total Score: 39 Timed Up and Go Test TUG: Normal  TUG;Cognitive TUG Normal TUG (seconds): 18.8 (with RW) Cognitive TUG (seconds): 26 (counting backwards by 2's) Static Sitting Balance Static Sitting - Balance Support: Feet supported;No upper extremity supported Static Sitting - Level of Assistance: 7: Independent Dynamic Sitting Balance Dynamic Sitting - Balance Support: Feet supported;No upper extremity supported Dynamic Sitting - Level of Assistance: 6: Modified independent (Device/Increase time) Static Standing Balance Static Standing - Balance Support: Bilateral upper extremity supported;During functional activity Static Standing - Level of Assistance: 6: Modified independent (Device/Increase time) Static Stance: Eyes closed Static Stance: Eyes Closed: x30 sec with S Static Stance: on Foam: x30 sec with S Static Stance: on Foam, Eyes Closed: x7-8 sec before major LOB posteriorly, maxA to recover Dynamic Standing Balance Dynamic Standing - Balance Support: Bilateral upper extremity supported;During functional activity Dynamic Standing - Level of Assistance: 5: Stand by assistance Extremity Assessment  RUE Assessment RUE Assessment: Exceptions to [facility] RUE AROM (degrees) RUE Overall AROM Comments: WNL- 180 degrees RUE Strength RUE Overall Strength Comments: 4+/5 throughout, WNL gross grasp LUE Assessment LUE Assessment: Exceptions to WFL LUE AROM (degrees) LUE Overall AROM Comments: WNL in all joints LUE Strength LUE Overall Strength Comments: 4/5 throughout slightly deminished gross grasp; uses compensatory strategies for elbow flexion/extension despite WNL strength RLE Assessment RLE Assessment: Exceptions to [facility] RLE Strength Right Hip Flexion: 4+/5 Right Knee Flexion: 4+/5 Right Knee Extension: 4+/5 Right Ankle Dorsiflexion: 4/5 Right Ankle Plantar Flexion: 4+/5 LLE Assessment LLE Assessment: Exceptions to [facility] LLE Strength Left Hip Flexion: 4+/5 Left Knee Flexion: 4+/5 Left Knee Extension: 4+/5 Left Ankle  Dorsiflexion: 4/5 Left Ankle Plantar Flexion: 4+/5  Skilled Therapeutic Intervention: Pt received seated in w/c, denies pain and agreeable to treatment. Assessed all mobility as described above with S overall, min guard for stairs with husband providing assist to prepare for d/c home. Balance assessments as above indicating high risk for falls; educated pt and husband on continued high risk and that risk driving decisions regarding use of RW and S at home, pt and husband agreeable. Pt and husband with no further questions/concerns regarding d/c at this time. Remained seated in w/c at end of session, husband present and all needs in reach.    See Function Navigator for Current Functional Status.  Benjiman Core Tygielski 02/19/2017, 11:34 AM

## 2017-02-19 NOTE — Discharge Summary (Signed)
NAME:  Joanne Morrison, Joanne Morrison NO.:  [id number]  MEDICAL RECORD NO.:  [id number]  LOCATION:                                 FACILITY:  PHYSICIAN:  Meredith Staggers, M.D.DATE OF BIRTH:  03-11-1951  DATE OF ADMISSION:  02/04/2017 DATE OF DISCHARGE:  02/20/2017                              DISCHARGE SUMMARY   DISCHARGE DIAGNOSES: 1. Tetraplegia secondary to Guillain-Barre syndrome. 2. Subcutaneous heparin for deep venous thrombosis prophylaxis. 3. Pain management. 4. Depression. 5. Tracheostomy-decannulated. 6. Gastrostomy tube on January 26, 2017, per Interventional     Radiology, Dr. Sandi Mariscal. 7. Atrial fibrillation and bradycardia. 8. 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 9. Acute blood loss anemia.  HISTORY OF PRESENT ILLNESS:  This is a 66 year old right-handed female with history of chronic cystitis, hypertension, depression, chronic low back pain, who lives with her husband was independent prior to admission.  Presented on January 12, 2017, with lower extremity weakness, numbness left greater than right, left upper extremity weakness, 3-5 days urinary retention.  The patient reportedly placed on Zithromax recently for strep throat.  She was seen at [facility], discharged to home before returning back with increasing weakness.  MRI of the brain, entire spine negative for significant acute changes.  No cord compression.  Neurology consulted.  Noted the patient to be areflexic of the lower extremities.  Suspect GBS.  Placed on IVIG x5 doses.  Lumbar puncture with CSF, protein elevated without elevated WBCs.  Physical therapy evaluation is completed and ongoing.  The patient developed increasing weakness and difficulty with secretions Pulmonary Care Medicine consulted for deterioration on respiratory status.  She was intubated, again placed on IVIG per Neurology Services. Found to be hyponatremic.   Placed on normal saline.  Initially, a nasogastric tube was placed for nutritional support.  Runs of nonsustained episodes of atrial fibrillation on telemetry, bouts of bradycardia.  Cardiology Service is consulted, advised conservative care.  Echocardiogram with ejection fraction of 70%.  No wall motion abnormalities.  Prolonged intubation and underwent tracheostomy on January 23, 2017, per Dr. Nelda Marseille as well as placement of gastrostomy tube on January 26, 2017, per Interventional Radiology.  Her diet had since been advanced to regular.  Tracheostomy changed to #6 cuffless on February 03, 2017.  Ophthalmology consulted on January 26, 2017, for left eye corneal abrasion secondary to her eyelids not closing and underwent tarsorrhaphy on February 04, 2017, placed on erythromycin ointment.  Subcutaneous heparin for DVT prophylaxis.  The patient was admitted for a comprehensive rehab program.  PAST MEDICAL HISTORY:  See discharge diagnoses.  SOCIAL HISTORY:  Lives with spouse, independent prior to admission.  FUNCTIONAL STATUS:  Upon admission to [facility], was moderate assist sit-to-stand, max assist squat pivot transfers, +2 physical assist sit-to-supine, max total assist activities of daily living.  PHYSICAL EXAMINATION:  VITAL SIGNS:  Blood pressure 133/84, pulse 99, temperature 98. GENERAL:  This is an alert female, oriented x3. HEENT:  Left eye injected with redness sutures, but open.  Right pupil, round and reactive to light.  There is a tracheostomy tube in place. CARDIAC:  Regular rate and  rhythm.  No murmur. RESPIRATORY:  Breath sounds are normal.  PEG tube in place. ABDOMEN:  Soft, nontender.  Good bowel sounds. EXTREMITIES:  Left upper extremity 4/5 proximal, 4-/5 distal.  Left lower extremity 3-/5 proximal and 4/5 distal.  Right upper extremity, 4/5 proximal, 2/5 distal.  Right lower extremity, 3+/5 proximal and 4/5 distally.  REHABILITATION HOSPITAL COURSE:  The  patient was admitted to Inpatient Rehab Services with therapies initiated on a 3-hour daily basis consisting of physical therapy, occupational therapy, speech therapy, and rehabilitation nursing.  The following issues were addressed during the patient's rehabilitation stay.  Pertaining to Ms. Klemmer' GBS, she completed a course of IVIG and would follow up Neurology Services.  She continued to progress nicely with overall therapies.  Subcutaneous heparin for DVT prophylaxis.  Vascular studies negative.  Pain management with the use of a Duragesic patch as well as a Lidoderm patch and good results.  She continued on Zoloft for hospital acquired depression participating fully with her therapies.  Her tracheostomy tube had been discontinued on February 11, 2017.  Oxygen saturations greater than 90%.  Gastrostomy tube placement for nutritional support for dysphagia on January 26, 2017, per Interventional Radiology.  Her diet had since been advanced to regular.  Gastrostomy tube would remain in place for approximately 6 weeks.  She could follow up with Interventional Radiology.  Atrial fibrillation and bradycardia.  Heart rate is controlled.  Blood pressure monitored and remained asymptomatic. Cardiology Services advised conservative care.  Followed by Ophthalmology Services for lagophthalmos.  She underwent tarsorrhaphy on February 04, 2017.  Continued with erythromycin ointment.  She would follow up outpatient Ophthalmology Services.  The patient received weekly collaborative interdisciplinary team conferences to discuss estimated length of stay, family teaching, any barriers to discharge. She was ambulating 150 feet x 2, close supervision.  Performed turn to sit with her rolling walker close supervision.  Standing balance again with supervision.  She could ambulate to and from her room to the Therapy Department.  She engaged in activities of daily living, was able to complete upper body  dressing, needing some assistance for lower body dressing.  Working with energy conservation techniques.  Doing well with a regular diet.  Full family teaching was completed and plan discharge to home.  DISCHARGE MEDICATIONS:  Included; 1. Erythromycin ophthalmic ointment, both eyes every 4 hours. 2. Duragesic patch 50 mcg change every 72 hours. 3. Lidoderm patch daily. 4. Ativan 1 mg p.o. every 12 hours. 5. Protonix 40 mg p.o. daily. 6. Zoloft 50 mg p.o. at bedtime.  DIET:  Regular.  FOLLOWUP:  The patient would follow up with Dr. Alger Simons at the Outpatient Rehab Service as advised; Dr. Luberta Mutter, Ophthalmology Service; Dr. Baltazar Apo as needed; Dr. Sandi Mariscal, call for appointment as needed in regard to discontinuation of PEG tube in end of March; Dr. Crist Infante, medical management.     Lauraine Rinne, P.A.   ______________________________ Meredith Staggers, M.D.    DA/MEDQ  D:  02/19/2017  T:  02/19/2017  Job:  [id number]  cc:   Collene Gobble, MD Sandi Mariscal, MD Jeannette How. Perini, M.D. Meredith Staggers, M.D. Roselie Awkward, MD

## 2017-02-19 NOTE — Progress Notes (Signed)
Social Work Patient ID: Joanne Morrison, female   DOB: 01-01-1951, 66 y.o.   MRN: [id number]   Have reviewed team conference with pt and spouse.  They are both very pleased and agreeable with earlier d/c date of 3/9 and recommendation to do OP follow up therapies.  Referrals being made.  Pt for d/c tomorrow.  [name], LCSW

## 2017-02-20 MED ORDER — ERYTHROMYCIN 5 MG/GM OP OINT: 1.0000 "application " | TOPICAL_OINTMENT | OPHTHALMIC | 0 refills | Status: DC

## 2017-02-20 MED ORDER — LIDOCAINE 5 % EX PTCH: 1.0000 | MEDICATED_PATCH | CUTANEOUS | 0 refills | Status: DC

## 2017-02-20 MED ORDER — FENTANYL 25 MCG/HR TD PT72: 25.0000 ug | MEDICATED_PATCH | TRANSDERMAL | 0 refills | Status: DC

## 2017-02-20 MED ORDER — SERTRALINE HCL 50 MG PO TABS: 50.0000 mg | ORAL_TABLET | Freq: Every day | ORAL | 1 refills | Status: DC

## 2017-02-20 MED ORDER — PANTOPRAZOLE SODIUM 40 MG PO TBEC: 40.0000 mg | DELAYED_RELEASE_TABLET | Freq: Every day | ORAL | 1 refills | Status: DC

## 2017-02-20 MED ORDER — LORAZEPAM 1 MG PO TABS: 1.0000 mg | ORAL_TABLET | Freq: Two times a day (BID) | ORAL | 0 refills | Status: DC

## 2017-02-20 NOTE — Discharge Instructions (Signed)
Inpatient Rehab Discharge Instructions  Joanne Morrison Discharge date and time: No discharge date for patient encounter.   Activities/Precautions/ Functional Status: Activity: activity as tolerated Diet: regular diet Wound Care: Keep wound clean and dry Functional status:  ___ No restrictions     ___ Walk up steps independently ___ 24/7 supervision/assistance   ___ Walk up steps with assistance ___ Intermittent supervision/assistance  ___ Bathe/dress independently ___ Walk with walker     _x__ Bathe/dress with assistance ___ Walk Independently    ___ Shower independently ___ Walk with assistance    ___ Shower with assistance ___ No alcohol     ___ Return to work/school ________    COMMUNITY REFERRALS UPON DISCHARGE:    Outpatient: PT     OT    ST                  Agency:  [facility]      Phone:  [phone]               Appointment Date/Time:  02-24-17    8:45 - 11:00 (arrive at 8:15 for paperwork).   You will have all 3 therapy evaluations on that day.  Medical Equipment/Items Ordered: wheelchair and cushion                                                     Agency/Supplier:  [place] @ [phone]     Special Instructions:    My questions have been answered and I understand these instructions. I will adhere to these goals and the provided educational materials after my discharge from the hospital.  Patient/Caregiver Signature _______________________________ Date __________  Clinician Signature _______________________________________ Date __________  Please bring this form and your medication list with you to all your follow-up doctor's appointments.

## 2017-02-20 NOTE — Progress Notes (Signed)
Social Work Discharge Note Discharge Note  The overall goal for the admission was met for:   Discharge location: Yes - home with spouse able to provide 24/7 assistance  Length of Stay: Yes - 16 days  Discharge activity level: Yes - supervision  Home/community participation: Yes  Services provided included: MD, RD, PT, OT, SLP, RN, TR, Pharmacy, Neuropsych and SW  Financial Services: Private Insurance: [place]  Follow-up services arranged: Outpatient: PT, OT, ST via [facility], DME: 18x18 lightweight w/c, cushion via [place] and Patient/Family has no preference for HH/DME agencies  Comments (or additional information):  Patient/Family verbalized understanding of follow-up arrangements: Yes  Individual responsible for coordination of the follow-up plan: pt  Confirmed correct DME delivered: Kenai Fluegel 02/20/2017    Valentine Barney

## 2017-02-20 NOTE — Progress Notes (Signed)
[facility]     PROGRESS NOTE    Subjective/Complaints: Still itching. Benadryl helps. Ready to go home!   ROS: pt denies nausea, vomiting, diarrhea, cough, shortness of breath or chest pain     Objective: Vital Signs: Blood pressure 138/74, pulse 78, temperature 98.2 F (36.8 C), temperature source Oral, resp. rate 18, weight 66 kg (145 lb 8.1 oz), SpO2 99 %. No results found. No results for input(s): WBC, HGB, HCT, PLT in the last 72 hours. No results for input(s): NA, K, CL, GLUCOSE, BUN, CREATININE, CALCIUM in the last 72 hours.  Invalid input(s): CO CBG (last 3)  No results for input(s): GLUCAP in the last 72 hours.  Wt Readings from Last 3 Encounters:  02/20/17 66 kg (145 lb 8.1 oz)  02/04/17 68.1 kg (150 lb 2.1 oz)  10/15/16 72.7 kg (160 lb 5 oz)    Physical Exam:  Constitutional: She appears well-developed and well-nourished. NAD.  HENT: Normocephalic. Atraumatic.  Eyes: Left lid with gauze/sutures--clean-stable Neck: trach scar Cardiovascular: RRR. No JVD.  Respiratory: CTA B. Unlabored.  GI: Soft. Bowel sounds are normal. +PEG Musculoskeletal: She exhibits no edema or tenderness.  Neurological: She is alert and oriented.  Motor:  LUE: 3/5 proximally, 4/5 distally   LLE:  4+/5 proximally, 4+/5 distally RUE: 4-/5 proximal to 4/5 distal RLE: 4+/5 proximally, 4+/5 distally---stable Decreased sensation over soles of feet.  left CN 7 injury Skin: Skin is warm and dry. Irritation on back from scratching. Don't see frank rash Psychiatric: Normal mood and behavior.   Assessment/Plan: 1. 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 Physiatrist is providing close team supervision and 24 hour management of active medical problems listed below. Physiatrist and rehab team continue to assess barriers to discharge/monitor patient progress toward  functional and medical goals.  Function:  Bathing Bathing position   Position: Shower  Bathing parts Body parts bathed by patient: Right arm, Left arm, Chest, Abdomen, Front perineal area, Buttocks, Right upper leg, Left upper leg, Right lower leg, Left lower leg Body parts bathed by helper: Back  Bathing assist Assist Level: Supervision or verbal cues      Upper Body Dressing/Undressing Upper body dressing   What is the patient wearing?: Pull over shirt/dress Bra - Perfomed by patient: Thread/unthread right bra strap, Thread/unthread left bra strap Bra - Perfomed by helper: Hook/unhook bra (pull down sports bra) Pull over shirt/dress - Perfomed by patient: Thread/unthread right sleeve, Thread/unthread left sleeve, Put head through opening, Pull shirt over trunk Pull over shirt/dress - Perfomed by helper: Put head through opening, Pull shirt over trunk        Upper body assist Assist Level: More than reasonable time   Set up : To obtain clothing/put away  Lower Body Dressing/Undressing Lower body dressing   What is the patient wearing?: Underwear, Pants, Shoes, [employer]  - Performed by patient: Thread/unthread right underwear leg, Thread/unthread left underwear leg, Pull underwear up/down Underwear - Performed by helper: Thread/unthread left underwear leg, Pull underwear up/down Pants- Performed by patient: Thread/unthread right pants leg, Thread/unthread left pants leg, Pull pants up/down Pants- Performed by helper: Thread/unthread left pants leg, Pull pants up/down Non-skid slipper socks- Performed by patient: Don/doff right sock, Don/doff left sock   Socks - Performed by patient: Don/doff right sock, Don/doff left sock Socks - Performed by helper: Don/doff right sock, Don/doff left sock Shoes - Performed by  patient: Don/doff right shoe, Don/doff left shoe (elastic shoe laces ) Shoes - Performed by helper: Don/doff right shoe, Don/doff left shoe, Fasten right, Fasten  left       TED Hose - Performed by helper: Don/doff left TED hose, Don/doff right TED hose  Lower body assist Assist for lower body dressing: Supervision or verbal cues      Toileting Toileting   Toileting steps completed by patient: Adjust clothing prior to toileting, Performs perineal hygiene, Adjust clothing after toileting Toileting steps completed by helper: Adjust clothing prior to toileting, Performs perineal hygiene, Adjust clothing after toileting Toileting Assistive Devices: Grab bar or rail  Toileting assist Assist level: Supervision or verbal cues, More than reasonable time   Transfers Chair/bed transfer Chair/bed transfer activity did not occur: Safety/medical concerns Chair/bed transfer method: Ambulatory Chair/bed transfer assist level: Supervision or verbal cues Chair/bed transfer assistive device: Medical sales representative Ambulation activity did not occur: Safety/medical concerns   Max distance: 175 Assist level: Supervision or verbal cues   Wheelchair   Type: Manual Max wheelchair distance: 150 Assist Level: Supervision or verbal cues  Cognition Comprehension Comprehension assist level: Follows complex conversation/direction with extra time/assistive device  Expression Expression assist level: Expresses complex ideas: With extra time/assistive device  Social Interaction Social Interaction assist level: Interacts appropriately with others with medication or extra time (anti-anxiety, antidepressant).  Problem Solving Problem solving assist level: Solves complex problems: With extra time  Memory Memory assist level: More than reasonable amount of time   Medical Problem List and Plan: 1.  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 IVIG course completed. No current plans for plasma exchange  -continue CIR   -home today  -Patient to see MD in the office for transitional care encounter in 1-2 weeks. 2.  DVT Prophylaxis/Anticoagulation: Subcutaneous  heparin.  Vascular study neg for DVT 3. Pain Management: Lidoderm patch, Duragesic patch--decreased to 53mcg. Likely will wean/dc at [facility] visit  -pain remains controlled 4. Mood: Zoloft 50 mg daily, Ativan as needed 5. Neuropsych: This patient is capable of making decisions on her own behalf. 6. Skin/Wound Care: Routine skin checks 7. Fluids/Electrolytes/Nutrition:   -repleted K+,  8. Tracheostomy 01/23/2017. Discontinued on 02/11/2017 9. Gastrostomy tube 01/26/2017 per interventional radiology. Diet advanced to regular 02/04/2017 after modified barium swallow Follow-up speech therapy,     -remove Gastrostomy tube at end of month 10. Atrial fibrillation/bradycardia. Cardiac rate controlled at present. Follow-up with cardiology services Vitals:   02/19/17 1700 02/20/17 0547  BP: 139/74 138/74  Pulse: 80 78  Resp: 18 18  Temp: 98.4 F (36.9 C) 98.2 F (36.8 C)   11. 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 Underwent tarorhaphy 02/04/2017. Continue erythromycin ointment for now follow-up ophthalmology services as needed, Contacted ophthalmology advised continued eyedrops.  -eye clean/decreased irritation and drainage 12. ABLA  Hb 11.2 on 2/22  Holding at 11.2   13. Pruritus: fentanyl related? Vs contact/heat rash  -fentanyl decreased to 53mcg  -double sheeted bed  LOS (Days) 16 A FACE TO FACE EVALUATION WAS PERFORMED  Meredith Staggers, MD 02/20/2017 9:13 AM

## 2017-02-24 ENCOUNTER — Ambulatory Visit: Payer: BLUE CROSS/BLUE SHIELD | Admitting: Physical Therapy

## 2017-02-24 ENCOUNTER — Encounter: Payer: Self-pay | Admitting: Occupational Therapy

## 2017-02-24 ENCOUNTER — Ambulatory Visit: Payer: BLUE CROSS/BLUE SHIELD | Attending: Physical Medicine & Rehabilitation | Admitting: Speech Pathology

## 2017-02-24 ENCOUNTER — Ambulatory Visit: Payer: BLUE CROSS/BLUE SHIELD | Admitting: Occupational Therapy

## 2017-02-24 DIAGNOSIS — R41841 Cognitive communication deficit: Secondary | ICD-10-CM

## 2017-02-24 DIAGNOSIS — R208 Other disturbances of skin sensation: Secondary | ICD-10-CM | POA: Diagnosis present

## 2017-02-24 DIAGNOSIS — R2689 Other abnormalities of gait and mobility: Secondary | ICD-10-CM

## 2017-02-24 DIAGNOSIS — M25512 Pain in left shoulder: Secondary | ICD-10-CM

## 2017-02-24 DIAGNOSIS — R2681 Unsteadiness on feet: Secondary | ICD-10-CM

## 2017-02-24 DIAGNOSIS — M6281 Muscle weakness (generalized): Secondary | ICD-10-CM | POA: Diagnosis present

## 2017-02-24 DIAGNOSIS — R278 Other lack of coordination: Secondary | ICD-10-CM | POA: Insufficient documentation

## 2017-02-24 NOTE — Therapy (Signed)
[facility] [address] [place], [place], [address] Phone: [phone]   Fax:  [phone]  Physical Therapy Evaluation  Patient Details  Name: Joanne Morrison MRN: [id number] Date of Birth: [birth year] Referring Provider: Dr. Alger Simons  Encounter Date: 02/24/2017      PT End of Session - 02/24/17 1339    Visit Number 1   Number of Visits 21   Date for PT Re-Evaluation 04/25/17   Authorization Type [employer]   PT Start Time 1150   PT Stop Time 1240   PT Time Calculation (min) 50 min   Equipment Utilized During Treatment Gait belt      Past Medical History:  Diagnosis Date  . Anxiety   . Arthritis    back  . Chronic cystitis   . Chronic low back pain   . Depression   . Diverticulosis of colon   . Hemorrhoids   . History of colon polyps    09-24-2009  rectal hyperplastic polyp/   and 2016 non-cancerous  . History of endometriosis   . History of panic attacks   . Hyperlipidemia   . Osteopenia   . Pelvic pain   . Sensation of pressure in bladder area   . Wears glasses     Past Surgical History:  Procedure Laterality Date  . BUNIONECTOMY Left 2004 approx  . CARPAL TUNNEL RELEASE Bilateral right 2014;  left 2007  . COLONOSCOPY W/ POLYPECTOMY  09/24/2009  . CYSTO WITH HYDRODISTENSION [name] 10/15/2016   Procedure: CYSTOSCOPY/HYDRODISTENSION AND MARCAINE INSTILLATION;  Surgeon: Rana Snare, MD;  Location: [facility];  Service: Urology;  Laterality: [name];  . CYSTO/  HYDRODISTENTION/  INSTILLATION THERAPY  1997 approx  . HYSTEROSCOPY W/D&C  11/01/2004   POLYPECTOMY  . IR GENERIC HISTORICAL  01/26/2017   IR GASTROSTOMY TUBE MOD SED 01/26/2017 Sandi Mariscal, MD [facility]  . LAPAROSCOPIC VAGINAL HYSTERECTOMY WITH SALPINGO OOPHORECTOMY Bilateral 04/20/2012  . TUBAL LIGATION  yrs ago    There were no vitals filed for this visit.       Subjective Assessment - 02/24/17 1347    Subjective Pt  presents to PT accompanied by her husband, using a RW for assistance with ambulation;  Pt states her legs became very weak on evening of 01-10-17 after she had cleaned her house all day:  went to [facility] ED and was admitted on 01-12-17, reports she was in ICU for 2 weeks and was transferred to inpatient rehab; she was discharged home on 02-20-17 with husband's assistance                                                                                                                                     Pertinent History chronic low back pain, arthritis, depression/anxiety;  tracheostomy, gastrostomy tube 01-26-17, atrial fibrillation and bradycardia, 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 Alger Simons   Onset Date/Surgical Date 01/10/17   Prior Therapy inpatient rehab     Precautions   Precautions Fall;Other (comment)   Precaution Comments left eye sewn closed - cannot get wet     Restrictions   Weight Bearing Restrictions No     Balance Screen   Has the patient fallen in the past 6 months No   Has the patient had a decrease in activity level because of a fear of falling?  Yes   Is the patient reluctant to leave their home because of a fear of falling?  No     Home Environment   Living Environment Private residence   Type of [place] to enter   Entrance Stairs-Number of Steps 4   Entrance Stairs-Rails Right;Left;Can reach both   [place] level     Sensation   Light Touch Impaired by gross assessment   Additional Comments pt reports tingling in bil. feet and fingertips     ROM / Strength   AROM / PROM / Strength AROM;Strength     AROM   Overall AROM  Within functional limits for tasks performed     Strength   Overall Strength Deficits   Right/Left Hip Right;Left   Right Hip Flexion 4/5    Right Hip Extension 3-/5   Right Hip ABduction 3+/5   Left Hip Flexion 3+/5   Left Hip Extension 3-/5   Left Hip ABduction 3+/5   Right Knee Flexion 4-/5   Right Knee Extension 5/5   Left Knee Flexion 4-/5   Left Knee Extension 5/5   Right Ankle Dorsiflexion 4-/5   Right Ankle Plantar Flexion 4-/5   Left Ankle Dorsiflexion 5/5   Left Ankle Plantar Flexion 4-/5     Bed Mobility   Bed Mobility Rolling Right;Rolling Left;Sit to Supine   Rolling Right 6: Modified independent (Device/Increase time)   Rolling Left 6: Modified independent (Device/Increase time)   Supine to Sit 6: Modified independent (Device/Increase time)   Sit to Supine 6: Modified independent (Device/Increase time)     Transfers   Transfers Sit to Stand   Comments Pt able to transfer sit to stand without UE support     Ambulation/Gait   Ambulation/Gait Yes   Ambulation/Gait Assistance 4: Min guard   Ambulation Distance (Feet) 120 Feet   Assistive device Rolling walker   Gait Pattern Step-through pattern;Decreased arm swing - right;Decreased arm swing - left   Ambulation Surface Level;Indoor   Gait velocity 15.44 secs with RW =2.12 ft/sec  15.06 secs without RW = 2.18 ft/sec   Stairs Yes   Stairs Assistance 5: Supervision   Stair Management Technique Two rails   Number of Stairs 4   Height of Stairs 6     Standardized Balance Assessment   Standardized Balance Assessment Timed Up and Go Test     Timed Up and Go Test   Normal TUG (seconds) 17.38  15.22 secs w/out RW   TUG Comments 17.38 secs with RW                           PT Education - 02/24/17 1337    Education provided Yes   Education Details Discussed eval test scores; reviewed HEP given from rehab - discontinued hip abduction  in supine and added sit to stand   Person(s) Educated Patient;Spouse   Methods Explanation;Demonstration   Comprehension Verbalized understanding;Returned demonstration          PT Short Term Goals  - 02/24/17 1519      PT SHORT TERM GOAL #1   Title Pt will ambulate household distances modified independently without use of RW.  03-27-17   Time 4   Period Weeks   Status New     PT SHORT TERM GOAL #2   Title Amb. 500' with RW with SBA for increased community accessibility.  03-27-17   Time 4   Period Weeks   Status New     PT SHORT TERM GOAL #3   Title Improve TUG score to </= 13.5 secs for decreased fall risk.  03-27-17   Baseline 15.22 secs without RW   Time 4   Period Weeks   Status New     PT SHORT TERM GOAL #4   Title Increase gait velocity to >/= 2.6 ft/sec without device for incr. gait efficiency.  03-27-17   Time 4   Period Weeks   Status New     PT SHORT TERM GOAL #5   Title Increase Berg score by at least 5 points to reduce fall risk.  03-27-17   Baseline TBA next session   Time 4   Period Weeks   Status New     Additional Short Term Goals   Additional Short Term Goals Yes     PT SHORT TERM GOAL #6   Title Independent in HEP for lower extremity strengthening and balance exercises.  03-27-17   Time 4   Period Weeks   Status New           PT Long Term Goals - 02/24/17 1524      PT LONG TERM GOAL #1   Title Modified independent ambulation 1000' on even surfaces without device.  04-26-17   Time 8   Period Weeks   Status New     PT LONG TERM GOAL #2   Title Increase Berg score by at least 10 points for improved balance and for reduced fall risk.  04-26-17   Time 8   Period Weeks   Status New     PT LONG TERM GOAL #3   Title Improve TUG score to </= 11.5 secs without device to demo improved functional mobility.  04-26-17   Baseline 17.38 secs with RW:  15.22 secs without RW   Time 8   Period Weeks   Status New     PT LONG TERM GOAL #4   Title Negotiate steps using step over step sequence without rail modified independently.  04-26-17   Baseline SBA with use of 2 rails step by step   Time 8   Period Weeks   Status New     PT LONG TERM GOAL #5    Title Independent in updated HEP for strengthening and balance.  04-26-17   Time 8   Period Weeks   Status New               Plan - 02/24/17 1340    Clinical Impression Statement Pt is a 66 year old lady with onset of lower extremity weakness, tingling and urinary retention x 3-5 days with symptoms that started on 01-10-17.  Pt presented to [facility] ED on 01-11-17 and was discharged home at her request with plan to follow up with PCP.  Pt presented to [facility] on 01-12-17 with worsening symptoms and was admitted with diagnosis of GBS.  Pt developed tetraplegia, underwent tracheostomy  and a gastrostomy tube on 01-26-17.  Pt was transferred to inpatient rehab at [facility] on 02-04-17 and discharged home on 02-20-17.  Past medical history includes chronic LBP, depression/anxiety, bradycardia, and osteopenia.  Moderate decision making required in POC.                                      PT Frequency 3x / week  then 2x/week x 4 weeks   PT Duration 8 weeks  3x/week x 4, then decrease to 2x/week x 4 weeks   PT Treatment/Interventions ADLs/Self Care Home Management;Gait training;Stair training;Functional mobility training;Therapeutic activities;Therapeutic exercise;Balance training;Neuromuscular re-education;Patient/family education   PT Next Visit Plan upgrade HEP - pt has HEP given from inpatient rehab:  Do Berg test   PT Home Exercise Plan see above - plan to add theraband for LE strengthening;  add hip extension in standing to HEP   Consulted and Agree with Plan of Care Patient      Patient will benefit from skilled therapeutic intervention in order to improve the following deficits and impairments:  Abnormal gait, Decreased endurance, Decreased activity tolerance, Decreased balance, Decreased mobility, Decreased strength, Impaired vision/preception, Pain, Impaired sensation  Visit Diagnosis: Muscle weakness (generalized) - Plan: PT plan of care cert/re-cert  Other abnormalities of gait and  mobility - Plan: PT plan of care cert/re-cert  Unsteadiness on feet - Plan: PT plan of care cert/re-cert     Problem List Patient Active Problem List   Diagnosis Date Noted  . Acute blood loss anemia   . Physical deconditioning   . Tracheostomy status ([place])   . Hypoxemia   . Dysphagia   . Numbness and tingling   . S/P percutaneous endoscopic gastrostomy (PEG) tube placement ([place])   . Bradycardia   . Left corneal abrasion   . Abnormal chest x-ray   . Acute respiratory failure with hypoxia ([place])   . Guillain Barr syndrome ([place])   . Atrial fibrillation with RVR ([place])   . Dysphagia, pharyngoesophageal phase   . History of ETT   . Encounter for central line placement   . Encounter for feeding tube placement   . Weakness of right lower extremity   . SOB (shortness of breath)   . Pneumonia   . Pressure injury of skin 01/16/2017  . Acute respiratory failure ([place])   . Hyponatremia with decreased serum osmolality   . Hyponatremia   . Quadriplegia and quadriparesis ([place]) 01/14/2017  . GBS (Guillain Barre syndrome) ([place]) 01/13/2017  . Weakness 01/12/2017  . Hypokalemia 01/12/2017  . Endometriosis 12/05/2013  . Chronic low back pain 08/19/2012  . HYPERLIPIDEMIA 12/10/2010  . COLONIC POLYPS, HX OF 12/13/2009  . HYPERGLYCEMIA, FASTING 11/22/2008  . Depression 02/07/2008  . ANOSMIA 02/07/2008  . HEADACHE, CHRONIC 02/07/2008  . MITRAL VALVE PROLAPSE 10/05/2007  . OSTEOPENIA 10/05/2007    Alda Lea, PT 02/24/2017, 3:39 PM  [facility] [address] [place], [place], [address] Phone: [phone]   Fax:  [phone]  Name: Zakeya Junker MRN: [id number] Date of Birth: 06-05-1951

## 2017-02-24 NOTE — Therapy (Signed)
[facility] [address] [place], [place], [address] Phone: [phone]   Fax:  [phone]  Occupational Therapy Evaluation  Patient Details  Name: Joanne Morrison MRN: [id number] Date of Birth: 1951/03/04 Referring Provider: Dr Naaman Plummer  Encounter Date: 02/24/2017      OT End of Session - 02/24/17 1223    Visit Number 1   Number of Visits 24   Date for OT Re-Evaluation 04/25/17   Authorization Type Medicare traditional / BCBS secondary   Authorization - Visit Number 1   Authorization - Number of Visits 10   OT Start Time 1100   OT Stop Time 1145   OT Time Calculation (min) 45 min   Activity Tolerance Patient tolerated treatment well   Behavior During Therapy [facility] for tasks assessed/performed      Past Medical History:  Diagnosis Date  . Anxiety   . Arthritis    back  . Chronic cystitis   . Chronic low back pain   . Depression   . Diverticulosis of colon   . Hemorrhoids   . History of colon polyps    09-24-2009  rectal hyperplastic polyp/   and 2016 non-cancerous  . History of endometriosis   . History of panic attacks   . Hyperlipidemia   . Osteopenia   . Pelvic pain   . Sensation of pressure in bladder area   . Wears glasses     Past Surgical History:  Procedure Laterality Date  . BUNIONECTOMY Left 2004 approx  . CARPAL TUNNEL RELEASE Bilateral right 2014;  left 2007  . COLONOSCOPY W/ POLYPECTOMY  09/24/2009  . CYSTO WITH HYDRODISTENSION [name] 10/15/2016   Procedure: CYSTOSCOPY/HYDRODISTENSION AND MARCAINE INSTILLATION;  Surgeon: Rana Snare, MD;  Location: [facility];  Service: Urology;  Laterality: [name];  . CYSTO/  HYDRODISTENTION/  INSTILLATION THERAPY  1997 approx  . HYSTEROSCOPY W/D&C  11/01/2004   POLYPECTOMY  . IR GENERIC HISTORICAL  01/26/2017   IR GASTROSTOMY TUBE MOD SED 01/26/2017 Sandi Mariscal, MD [facility]INTER[facility]  . LAPAROSCOPIC VAGINAL HYSTERECTOMY WITH SALPINGO OOPHORECTOMY  Bilateral 04/20/2012  . TUBAL LIGATION  yrs ago    There were no vitals filed for this visit.      Subjective Assessment - 02/24/17 1107    Subjective  I need my arms stronger to pick things up.   Patient is accompained by: Family member  Husband [name] Resides   Currently in Pain? Yes   Pain Score 5    Pain Location Back   Pain Orientation Lower   Pain Descriptors / Indicators Aching   Pain Type Chronic pain   Pain Frequency Intermittent   Aggravating Factors  Sitting still   Pain Relieving Factors movement           [facility] OT Assessment - 02/24/17 0001      Assessment   Diagnosis Rosalee Kaufman   Referring Provider Dr Naaman Plummer   Onset Date 01/12/17   Assessment Just left CIR 02/20/17   Prior Therapy Inpatient rehab until 02/20/17     Precautions   Precautions Fall;Other (comment)   Precaution Comments left eye sewn closed - cannot get wet     Restrictions   Weight Bearing Restrictions No     Balance Screen   Has the patient fallen in the past 6 months No   Has the patient had a decrease in activity level because of a fear of falling?  No   Is the patient reluctant to leave their home  because of a fear of falling?  No     Home  Environment   Bathroom Shower/Tub Tub/Shower unit;Curtain   Social research officer, government Grab bars - tub/shower;Hand held shower head;Tub bench;Walker - 2 wheels     Prior Function   Level of Independence Independent;Independent with basic ADLs;Independent with household mobility without device;Independent with community mobility without device;Independent with gait   Vocation On disability  chronic back pain   Leisure Homemaker, worked out regularly at [employer]     ADL   Eating/Feeding Minimal assistance   Eating/Feeding cannot cut meat, hand tremors prevent effectiveness with soup consistency   Grooming Minimal assistance   Grooming details Cannot dry and style hair   Upper Body Bathing Modified independent   Lower Body Bathing  Modified independent   Lower Body Bathing Details Seated on tub bench, uses hand held shower - often sat prior due to back pain   Upper Body Dressing Increased time   Lower Body Dressing Minimal assistance   Lower Body Dressing Details Husband assists with compression hose   Toilet Tranfer Modified independent   Toileting - Clothing Manipulation Modified independent   Toileting -  Paediatric nurse tub bench;Grab bars  hand held shower   ADL comments fatigues quickly     IADL   Prior Level of Function Shopping Independent   Shopping --  Has not yet attempted- just home from hospital   Prior Level of Function Light Housekeeping Independent   Light Housekeeping Needs help with all home maintenance tasks   Prior Level of Function Meal Prep Independent   Meal Prep --  Has not attempted - just home from hospital   Prior Level of [place] on family or friends for transportation     Written Expression   Dominant Hand Right   Handwriting 100% legible     Vision - History   Baseline Vision Bifocals  wears glasses all the time   Visual History Other (comment)  corneal abrasion due to decreased eyelid control/ dryness   Patient Visual Report Eye fatigue/eye pain/headache   Additional Comments Left eyelid weighted and sewn shut to prevent dryness     Vision Assessment   Eye Alignment Impaired (comment)   Vision Assessment Vision not tested   Ocular Range of Motion --  only able to assess right eye   Comment left eye sewn closed - impeded peripheral vision, left lateral vision     Activity Tolerance   Activity Tolerance Tolerates 10-20 min activity with muiltiple rests   Activity Tolerance Comments Patient indicates current activity tolerance is vastly different from prior level     Cognition   Overall Cognitive Status  Impaired/Different from baseline   Area of Impairment Memory   Memory Decreased short-term memory     Observation/Other Assessments   Observations skilled clinical judgement    Focus on Therapeutic Outcomes (FOTO)  Patient has not completed the survey   Outcome Measures 9 hole peg test, dynamometer testing     Sensation   Light Touch Impaired by gross assessment  reports stinging in finger tips and forefoot- bilateral   Stereognosis Appears Intact   Proprioception Impaired by gross assessment  mild     Coordination   Gross Motor Movements are Fluid and Coordinated No   Fine Motor Movements are Fluid and Coordinated No   Coordination and Movement  Description Tremors in hands withe effort.  Uses trunk and compensatory movements due to decreased proximal strength for range of motion shoulder L>R   Right 9 Hole Peg Test 30.00   Left 9 Hole Peg Test 31.18     Perception   Perception Within Functional Limits     Praxis   Praxis Intact     Tone   Assessment Location Right Upper Extremity;Left Upper Extremity     ROM / Strength   AROM / PROM / Strength AROM;PROM;Strength     AROM   Overall AROM  Deficits   Right/Left Shoulder Right;Left   Right Shoulder Flexion 135 Degrees   Right Shoulder ABduction 107 Degrees   Left Shoulder Flexion 130 Degrees   Left Shoulder ABduction 110 Degrees     PROM   Overall PROM  Within functional limits for tasks performed     Strength   Overall Strength Deficits     Hand Function   Right Hand Gross Grasp Impaired   Right Hand Grip (lbs) 45   Right Hand Lateral Pinch 8 lbs   Left Hand Gross Grasp Impaired   Left Hand Grip (lbs) 35   Left Hand Lateral Pinch 9 lbs     RUE Tone   RUE Tone Hypotonic     RUE Tone   Hypotonic Details Shoulder/trunk     LUE Tone   LUE Tone Hypotonic     LUE Tone   Hypotonic Details trunk, shoulder, elbow                         OT Education - 02/24/17 1223    Education provided  Yes   Education Details discussed evaluation findings and potential goal areas   Person(s) Educated Patient;Spouse   Methods Explanation   Comprehension Verbalized understanding          OT Short Term Goals - 02/24/17 1232      OT SHORT TERM GOAL #1   Title Patient will complete a home activity program daily with min prompting or set up    Time 4   Period Weeks   Status New     OT SHORT TERM GOAL #2   Title Patient will complete a home exercise program designed to improve range of motion in BUE's with no more than set up assistance   Time 4   Period Weeks   Status New     OT SHORT TERM GOAL #3   Title Patient will utilize bilateral upper extremities to cut a piece of meat during a meal with modified independence   Time 4   Period Weeks   Status New     OT SHORT TERM GOAL #4   Title Patient will don compression hose with increased time and without physical assistance   Time 4   Period Weeks   Status New     OT SHORT TERM GOAL #5   Title Patient will demonstrate sufficient activity tolerance to complete light housekeeping task in standing for greater than 5 minutes.     Time 4   Period Weeks   Status New           OT Long Term Goals - 02/24/17 1235      OT LONG TERM GOAL #1   Title Patient will compelte an updated home exercise program designed for UE strengthening daily   Time 8   Period Weeks   Status New     OT LONG  TERM GOAL #2   Title Patient will prepare simple hot meal for she and her husband with no rest breaks- 30 min of activity   Time 8   Period Weeks   Status New     OT LONG TERM GOAL #3   Title Patient will complete laundry task- including transporting items to washer, and transferring to dryer, folding and putting away clothing with intermittent assistance only   Time 8   Period Weeks   Status New     OT LONG TERM GOAL #4   Title Patient will utilize bilateral upper extremities to blow dry and style her hair with modified independence    Time 8   Period Weeks   Status New     OT LONG TERM GOAL #5   Title Patient will demonstrate sufficient UE/LE strength and balance to reach to floor to retrieve a lightweight (no more than 2 lb) object without loss of balance   Time 8   Period Weeks   Status New               Plan - 02/24/17 1225    Clinical Impression Statement Patient is a 66 year old female recently released from extended hospitalization, and inpatient rehab stay on 02/20/17 for new onset of Guillain Barre syndrome.  Patient was independent with ADL/IADL, and fairly active in the community prior to this hospitalization, and currently requires min assist for even basic self care skills due to decreased overall strength in all extremities Left >Right, decreased active movement in upper extremities left greater than right, decreased activity tolerance, decreased balance, and decreased sensation in hands and feet.  Patient will benefit from skilled OT intervention to increase independence with ADL/IADL, and patient is eager to return to prior level of activity.     Rehab Potential Excellent   OT Frequency 3x / week   OT Duration 8 weeks   OT Treatment/Interventions Self-care/ADL training;Electrical Stimulation;Therapeutic exercise;Neuromuscular education;Energy conservation;Manual Therapy;Functional Mobility Training;DME and/or AE instruction;Visual/perceptual remediation/compensation;Cognitive remediation/compensation;Therapeutic activities;Balance training;Patient/family education   Plan Review HEP from hospital and update.  Start with proximal shoulder exercises - supine- no resistance to start until motor pattern is restored.  Review OT goals.     Consulted and Agree with Plan of Care Patient;Family member/caregiver      Patient will benefit from skilled therapeutic intervention in order to improve the following deficits and impairments:  Decreased coordination, Decreased range of motion, Decreased endurance, Decreased  safety awareness, Decreased balance, Decreased cognition, Decreased strength, Impaired perceived functional ability, Impaired vision/preception, Impaired UE functional use, Decreased knowledge of use of DME, Impaired tone, Pain, Impaired sensation, Improper body mechanics  Visit Diagnosis: Muscle weakness (generalized) - Plan: Ot plan of care cert/re-cert  Other disturbances of skin sensation - Plan: Ot plan of care cert/re-cert  Acute pain of left shoulder - Plan: Ot plan of care cert/re-cert  Other lack of coordination - Plan: Ot plan of care cert/re-cert      G-Codes - 29/51/88 1242    Functional Assessment Tool Used (Outpatient only) Active/Passive range of motion, strength testing; skilled clinical observation   Functional Limitation Carrying, moving and handling objects   Carrying, Moving and Handling Objects Current Status (C1660) At least 60 percent but less than 80 percent impaired, limited or restricted   Carrying, Moving and Handling Objects Goal Status (Y3016) At least 20 percent but less than 40 percent impaired, limited or restricted      Problem List Patient Active Problem List  Diagnosis Date Noted  . Acute blood loss anemia   . Physical deconditioning   . Tracheostomy status ([place])   . Hypoxemia   . Dysphagia   . Numbness and tingling   . S/P percutaneous endoscopic gastrostomy (PEG) tube placement ([place])   . Bradycardia   . Left corneal abrasion   . Abnormal chest x-ray   . Acute respiratory failure with hypoxia ([place])   . Guillain Barr syndrome ([place])   . Atrial fibrillation with RVR ([place])   . Dysphagia, pharyngoesophageal phase   . History of ETT   . Encounter for central line placement   . Encounter for feeding tube placement   . Weakness of right lower extremity   . SOB (shortness of breath)   . Pneumonia   . Pressure injury of skin 01/16/2017  . Acute respiratory failure ([place])   . Hyponatremia with decreased serum osmolality   . Hyponatremia   .  Quadriplegia and quadriparesis ([place]) 01/14/2017  . GBS (Guillain Barre syndrome) ([place]) 01/13/2017  . Weakness 01/12/2017  . Hypokalemia 01/12/2017  . Endometriosis 12/05/2013  . Chronic low back pain 08/19/2012  . HYPERLIPIDEMIA 12/10/2010  . COLONIC POLYPS, HX OF 12/13/2009  . HYPERGLYCEMIA, FASTING 11/22/2008  . Depression 02/07/2008  . ANOSMIA 02/07/2008  . HEADACHE, CHRONIC 02/07/2008  . MITRAL VALVE PROLAPSE 10/05/2007  . OSTEOPENIA 10/05/2007    Mariah Milling, OTR/L 02/24/2017, 12:46 PM  [facility] [address] [place], [place], [address] Phone: [phone]   Fax:  [phone]  Name: Joanne Morrison MRN: [id number] Date of Birth: [birth year]

## 2017-02-24 NOTE — Patient Instructions (Signed)
   Do exercises 20x each, 3x a day - in the mirror, slow and big  1. Alternate pucker and smile - OOO-EEE  2. Open mouth big Ahh-OOO with mouth open big  3. Pucker and move your lips side to side  4. Puff up your cheeks with air BIG - swish air from side to side  5. Press lips together flat and pop them open  6. Pucker and kiss big  7. Raise your eyebrows together  8. Try to raise eyebrows 1 at a time     .

## 2017-02-24 NOTE — Therapy (Signed)
[facility] [address] [place], [place], [address] Phone: [phone]   Fax:  [phone]  Speech Language Pathology Evaluation  Patient Details  Name: Joanne Morrison MRN: [id number] Date of Birth: 03-13-1951 Referring Provider: Dr. Alger Simons  Encounter Date: 02/24/2017      End of Session - 02/24/17 1819    Date for SLP Re-Evaluation 04/24/17      Past Medical History:  Diagnosis Date  . Anxiety   . Arthritis    back  . Chronic cystitis   . Chronic low back pain   . Depression   . Diverticulosis of colon   . Hemorrhoids   . History of colon polyps    09-24-2009  rectal hyperplastic polyp/   and 2016 non-cancerous  . History of endometriosis   . History of panic attacks   . Hyperlipidemia   . Osteopenia   . Pelvic pain   . Sensation of pressure in bladder area   . Wears glasses     Past Surgical History:  Procedure Laterality Date  . BUNIONECTOMY Left 2004 approx  . CARPAL TUNNEL RELEASE Bilateral right 2014;  left 2007  . COLONOSCOPY W/ POLYPECTOMY  09/24/2009  . CYSTO WITH HYDRODISTENSION [name] 10/15/2016   Procedure: CYSTOSCOPY/HYDRODISTENSION AND MARCAINE INSTILLATION;  Surgeon: Rana Snare, MD;  Location: [facility];  Service: Urology;  Laterality: [name];  . CYSTO/  HYDRODISTENTION/  INSTILLATION THERAPY  1997 approx  . HYSTEROSCOPY W/D&C  11/01/2004   POLYPECTOMY  . IR GENERIC HISTORICAL  01/26/2017   IR GASTROSTOMY TUBE MOD SED 01/26/2017 Sandi Mariscal, MD [facility]  . LAPAROSCOPIC VAGINAL HYSTERECTOMY WITH SALPINGO OOPHORECTOMY Bilateral 04/20/2012  . TUBAL LIGATION  yrs ago    There were no vitals filed for this visit.          SLP Evaluation [facility] - 02/24/17 1022      SLP Visit Information   SLP Received On 02/24/17   Referring Provider Dr. Alger Simons   Onset Date 01/12/17   Medical Diagnosis Guillian Barre     Subjective   Patient/Family Stated Goal To  get well     General Information   HPI Patient is a 66 y.o. female with medical history significant of chronic low back pain, anxiety, depression, hypertension-recently had sorethroat (+strep) presented to the ED on 1/29 with lower ext weakness,tingling numbess that started in the lower extremities and subsequently started involving her upper extremities. Unable to elicit DTR in the lower ext. MRI of the entire spine negative. 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 Progressed to respiratory failure and required intubation 2/1, trach 2/9,  ST on CIR for higher level cognition.   Mobility Status uses walker     Prior Functional Status   Cognitive/Linguistic Baseline Within functional limits   Type of Home House    Lives With Spouse   Available Support Family   Vocation Retired     Pain Assessment   Pain Assessment No/denies pain     Cognition   Overall Cognitive Status Impaired/Different from baseline   Area of Impairment Attention;Problem solving   Current Attention Level Alternating   Problem Solving Slow processing;Difficulty sequencing   Memory Impaired   Memory Impairment Decreased short term memory   Awareness Impaired   Awareness Impairment Emergent impairment   Problem Solving Impaired   Problem Solving Impairment Verbal complex;Functional complex   Press photographer Recognition/Discrimination   Discrimination Exceptions  to [facility]   Other Visual Recogniton/Discrimination Comments Left eye sewn shut due to GBA, affects reading     Oral Motor/Sensory Function   Overall Oral Motor/Sensory Function Impaired   Labial ROM Reduced left   Labial Symmetry Abnormal symmetry left   Labial Strength Reduced Left   Labial Sensation Reduced Left   Labial Coordination Reduced   Lingual ROM Within Functional Limits   Lingual Symmetry Abnormal symmetry left  deviates right   Lingual Strength Within Functional Limits    Lingual Coordination Reduced   Facial ROM Reduced left   Facial Symmetry Left drooping eyelid;Left droop   Facial Strength Reduced   Facial Sensation Reduced   Facial Coordination Reduced   Velum Within Functional Limits     Motor Speech   Overall Motor Speech Impaired   Respiration Within functional limits   Phonation Normal   Resonance Within functional limits   Articulation Within functional limitis   Intelligibility Intelligible   Conversation 75-100% accurate   Motor Planning Witnin functional limits     Standardized Assessments   Standardized Assessments  Montreal Cognitive Assessment ([place])  23/30                         SLP Education - 02/24/17 1217    Education provided Yes   Education Details Oral motor exercises, goals for ST, areas of cognitive weakness   Person(s) Educated Patient;Spouse   Methods Explanation;Demonstration;Verbal cues;Handout   Comprehension Verbalized understanding;Returned demonstration;Need further instruction;Verbal cues required          SLP Short Term Goals - 02/24/17 1813      SLP SHORT TERM GOAL #1   Title Pt will perform HEP for facial weakness with mod I   Time 4   Period Weeks   Status New     SLP SHORT TERM GOAL #2   Title Pt will solve moderately complex reasoning, organizing, and functional math problems with rare min A and 85% accuracy   Time 4   Period Weeks   Status New     SLP SHORT TERM GOAL #3   Title Pt will alternate attention between 2 simple cognitive lingusitic tasks with 85% on each and occasional min A   Time 4   Period Weeks   Status New          SLP Long Term Goals - 02/24/17 1815      SLP LONG TERM GOAL #1   Title Pt will divide attention between 2 simple cognitive linguistic tasks with  90% on each and rare min A   Time 8   Period Weeks   Status New     SLP LONG TERM GOAL #2   Title Pt will verbalize 3 compensations for attention with mod I   Time 8   Period Weeks    Status New          Plan - 02/24/17 1227    Clinical Impression Statement [name] is referred to outpt ST due to facial weakness and high level cognitive impairments s/p Guillian Barre syndrome with hospitalization 01/12/17 to 02/19/17, including CIR. Today she presents with high level cognitive impairments of problem solving, reasoning and alternating and divided attention. Left side facial weakness and droop are noticeble and distracting, however conversational speech is 100% intellgilble in quiet environment. Mrs. Chasen scored a 23/30 on the Montreal Cognitive Assessment (MoCA), which is an improvement from her score of 11/22 on the MoCA BLIND on CIR.  Abstract reasoning, attention to details and memory were identified areas of impairment on the MoCA. Pt recalled 3/5 words, and was not oriented to the date. I recommend skilled ST to maximize high level cognition as well as train HEP for facial weakness for improved QOL and indpedence.    Treatment/Interventions Oral motor exercises;Compensatory strategies;Functional tasks;Patient/family education;Cognitive reorganization;SLP instruction and feedback;Internal/external aids   Potential to Achieve Goals Good   Potential Considerations Ability to learn/carryover information   Consulted and Agree with Plan of Care Patient;Family member/caregiver   Family Member Consulted sposue      Patient will benefit from skilled therapeutic intervention in order to improve the following deficits and impairments:   Cognitive communication deficit - Plan: SLP plan of care cert/re-cert      G-Codes - 81/27/51 April 10, 1218    Functional Assessment Tool Used NOMS -    Functional Limitations Attention   Attention Current Status (Z0017) At least 1 percent but less than 20 percent impaired, limited or restricted   Attention Goal Status (C9449) At least 1 percent but less than 20 percent impaired, limited or restricted      Problem List Patient Active Problem List    Diagnosis Date Noted  . Acute blood loss anemia   . Physical deconditioning   . Tracheostomy status ([place])   . Hypoxemia   . Dysphagia   . Numbness and tingling   . S/P percutaneous endoscopic gastrostomy (PEG) tube placement ([place])   . Bradycardia   . Left corneal abrasion   . Abnormal chest x-ray   . Acute respiratory failure with hypoxia ([place])   . Guillain Barr syndrome ([place])   . Atrial fibrillation with RVR ([place])   . Dysphagia, pharyngoesophageal phase   . History of ETT   . Encounter for central line placement   . Encounter for feeding tube placement   . Weakness of right lower extremity   . SOB (shortness of breath)   . Pneumonia   . Pressure injury of skin 01/16/2017  . Acute respiratory failure ([place])   . Hyponatremia with decreased serum osmolality   . Hyponatremia   . Quadriplegia and quadriparesis ([place]) 01/14/2017  . GBS (Guillain Barre syndrome) ([place]) 01/13/2017  . Weakness 01/12/2017  . Hypokalemia 01/12/2017  . Endometriosis 12/05/2013  . Chronic low back pain 08/19/2012  . HYPERLIPIDEMIA 12/10/2010  . COLONIC POLYPS, HX OF 12/13/2009  . HYPERGLYCEMIA, FASTING 11/22/2008  . Depression 02/07/2008  . ANOSMIA 02/07/2008  . HEADACHE, CHRONIC 02/07/2008  . MITRAL VALVE PROLAPSE 10/05/2007  . OSTEOPENIA 10/05/2007    Lovvorn, Annye Rusk MS, CCC-SLP 02/24/2017, 6:20 PM  [facility] [address] [place], [place], [address] Phone: [phone]   Fax:  [phone]  Name: Arvis Miguez MRN: [id number] Date of Birth: [birth year]

## 2017-02-25 ENCOUNTER — Ambulatory Visit: Payer: BLUE CROSS/BLUE SHIELD | Admitting: Physical Therapy

## 2017-02-25 ENCOUNTER — Ambulatory Visit: Payer: BLUE CROSS/BLUE SHIELD | Admitting: Occupational Therapy

## 2017-02-25 ENCOUNTER — Encounter: Payer: Self-pay | Admitting: Occupational Therapy

## 2017-02-25 DIAGNOSIS — R208 Other disturbances of skin sensation: Secondary | ICD-10-CM

## 2017-02-25 DIAGNOSIS — R278 Other lack of coordination: Secondary | ICD-10-CM

## 2017-02-25 DIAGNOSIS — R41841 Cognitive communication deficit: Secondary | ICD-10-CM | POA: Diagnosis not present

## 2017-02-25 DIAGNOSIS — M6281 Muscle weakness (generalized): Secondary | ICD-10-CM

## 2017-02-25 DIAGNOSIS — R2681 Unsteadiness on feet: Secondary | ICD-10-CM

## 2017-02-25 DIAGNOSIS — M25512 Pain in left shoulder: Secondary | ICD-10-CM

## 2017-02-25 NOTE — Patient Instructions (Signed)
Shoulder exercises Lying on your back. Holding a wooden dowel in both hands- hands shoulder width apart.  1)  Chest press Start with bar at your chest, and press up toward ceiling 10 repetitions 3 sets  2)  Shoulder press Start with your arms straight at chin level- and press upward toward the ceiling - shoulders will lift from bed. 10 repetitions 3 sets  3)  Forward rainbow Start with your arms straight at chin level - reach upward toward forehead, and downward toward waist.   10 repetitions 3 sets  4) Windshield wiper Start with arms straight at chin level- keep elbows straight.  Move dowel side to side.   10 repetitions 3 sets  5) Barrel Rolls Roll arms as if around a barrel on your stomach. 10 repetitions 3 sets

## 2017-02-25 NOTE — Therapy (Signed)
[facility] [address] [place], [place], [address] Phone: [phone]   Fax:  [phone]  Occupational Therapy Treatment  Patient Details  Name: Joanne Morrison MRN: [id number] Date of Birth: 12-04-1951 Referring Provider: Dr Naaman Plummer  Encounter Date: 02/25/2017      OT End of Session - 02/25/17 1636    Visit Number 2   Number of Visits 20   Date for OT Re-Evaluation 04/25/17   Authorization Type [employer] 20 VISIT limit - no authorization required   Authorization - Visit Number 1   Authorization - Number of Visits 20   OT Start Time 4010   OT Stop Time 1622   OT Time Calculation (min) 50 min   Activity Tolerance Patient tolerated treatment well   Behavior During Therapy [facility] for tasks assessed/performed      Past Medical History:  Diagnosis Date  . Anxiety   . Arthritis    back  . Chronic cystitis   . Chronic low back pain   . Depression   . Diverticulosis of colon   . Hemorrhoids   . History of colon polyps    09-24-2009  rectal hyperplastic polyp/   and 2016 non-cancerous  . History of endometriosis   . History of panic attacks   . Hyperlipidemia   . Osteopenia   . Pelvic pain   . Sensation of pressure in bladder area   . Wears glasses     Past Surgical History:  Procedure Laterality Date  . BUNIONECTOMY Left 2004 approx  . CARPAL TUNNEL RELEASE Bilateral right 2014;  left 2007  . COLONOSCOPY W/ POLYPECTOMY  09/24/2009  . CYSTO WITH HYDRODISTENSION [name] 10/15/2016   Procedure: CYSTOSCOPY/HYDRODISTENSION AND MARCAINE INSTILLATION;  Surgeon: Rana Snare, MD;  Location: [facility];  Service: Urology;  Laterality: [name];  . CYSTO/  HYDRODISTENTION/  INSTILLATION THERAPY  1997 approx  . HYSTEROSCOPY W/D&C  11/01/2004   POLYPECTOMY  . IR GENERIC HISTORICAL  01/26/2017   IR GASTROSTOMY TUBE MOD SED 01/26/2017 Sandi Mariscal, MD [facility]-INTER[facility]  . LAPAROSCOPIC VAGINAL HYSTERECTOMY WITH SALPINGO  OOPHORECTOMY Bilateral 04/20/2012  . TUBAL LIGATION  yrs ago    There were no vitals filed for this visit.      Subjective Assessment - 02/25/17 1602    Subjective  I need to be able to clean my tub   Patient is accompained by: Family member   Currently in Pain? No/denies   Pain Score 0-No pain                      OT Treatments/Exercises (OP) - 02/25/17 0001      ADLs   ADL Comments Reviewed all goals and will update long term goals.  Reviewed plan of treatment     Neurological Re-education Exercises   Other Exercises 1 Reviewed home exercise program from the hospital.  Per patient report, unable to do several of the resistance band training exercises.  Reviewed with patient and husband that inital goal was to improve movement in her arms without additional resistance in supine.  Intention will be to increase range of motion with control, and then add additional resistance.  Initiated HEP .                OT Education - 02/25/17 1635    Education provided Yes   Education Details Reviewed short and long term goals.  Patient added goals relating to cleaning / gettin gup from floor.  Initiated HEP UE    Person(s) Educated Patient;Spouse   Methods Explanation;Demonstration;Handout   Comprehension Verbalized understanding;Returned demonstration          OT Short Term Goals - 02/25/17 1641      OT SHORT TERM GOAL #1   Title Patient will complete a home activity program daily with min prompting or set up    Time 4   Period Weeks   Status On-going     OT SHORT TERM GOAL #2   Title Patient will complete a home exercise program designed to improve range of motion in BUE's with no more than set up assistance   Time 4   Period Weeks   Status On-going     OT SHORT TERM GOAL #3   Title Patient will utilize bilateral upper extremities to cut a piece of meat during a meal with modified independence   Time 4   Period Weeks   Status On-going     OT SHORT  TERM GOAL #4   Title Patient will don compression hose with increased time and without physical assistance   Period Weeks   Status On-going     OT SHORT TERM GOAL #5   Title Patient will demonstrate sufficient activity tolerance to complete light housekeeping task in standing for greater than 5 minutes.     Time 4   Period Weeks   Status On-going     Additional Short Term Goals   Additional Short Term Goals Yes     OT SHORT TERM GOAL #6   Title Patient will demonstrate effective mid level reach to retrieve lightweight item less than 2 lbs from chest height shelf in standing using BUE's   Time 4   Period Weeks   Status New           OT Long Term Goals - 02/25/17 1643      OT LONG TERM GOAL #1   Title Patient will complete an updated home exercise program designed for UE strengthening daily   Time 8   Period Weeks   Status On-going     OT LONG TERM GOAL #2   Title Patient will prepare simple hot meal for she and her husband with no rest breaks- 30 min of activity   Time 8   Period Weeks   Status On-going     OT LONG TERM GOAL #3   Title Patient will complete laundry task- including transporting items to washer, and transferring to dryer, folding and putting away clothing with intermittent assistance only   Time 8   Period Weeks   Status On-going     OT LONG TERM GOAL #4   Title Patient will utilize bilateral upper extremities to blow dry and style her hair with modified independence   Time 8   Period Weeks   Status On-going     OT LONG TERM GOAL #5   Title Patient will demonstrate sufficient UE/LE strength and balance to reach to floor to retrieve a lightweight (no more than 2 lb) object without loss of balance   Time 8   Period Weeks   Status On-going     Long Term Additional Goals   Additional Long Term Goals Yes     OT LONG TERM GOAL #6   Title Patient will demonstrate sufficient strength for unilateral reach to retrieve a 3 lb object from overhead shelf  while standing with right hand and left hand (not bilateral reach)     OT LONG TERM  GOAL #7   Title Patient will demonstrate sufficient strength to get down to floor to clean tub, and get back up without assistance   Time 8   Period Weeks   Status New               Plan - 02/25/17 1638    Clinical Impression Statement Patient is very motivated for improved functional performance.  Patient is frustrated / anxious regarding her eyelid- and does not like being seen in public currently, slightly tearful today.     Rehab Potential Excellent   OT Frequency 3x / week   OT Duration 4 weeks  Then 2 weeks/4 weeks   OT Treatment/Interventions Self-care/ADL training;Electrical Stimulation;Therapeutic exercise;Neuromuscular education;Energy conservation;Manual Therapy;Functional Mobility Training;DME and/or AE instruction;Visual/perceptual remediation/compensation;Cognitive remediation/compensation;Therapeutic activities;Balance training;Patient/family education   Plan Proximal UE strengthening - 4 point to side sit, plank if able to tolerate.  Body on arm to improve overhead reach bilateral (childs pose) Stand tolerance / stand balance with UE's engaged in functional activity   OT Home Exercise Plan Initiated HEP shoulders    Consulted and Agree with Plan of Care Patient;Family member/caregiver      Patient will benefit from skilled therapeutic intervention in order to improve the following deficits and impairments:  Decreased coordination, Decreased range of motion, Decreased endurance, Decreased safety awareness, Decreased balance, Decreased cognition, Decreased strength, Impaired perceived functional ability, Impaired vision/preception, Impaired UE functional use, Decreased knowledge of use of DME, Impaired tone, Pain, Impaired sensation, Improper body mechanics  Visit Diagnosis: Muscle weakness (generalized) - Plan: Ot plan of care cert/re-cert  Unsteadiness on feet - Plan: Ot plan of care  cert/re-cert  Other disturbances of skin sensation - Plan: Ot plan of care cert/re-cert  Acute pain of left shoulder - Plan: Ot plan of care cert/re-cert  Other lack of coordination - Plan: Ot plan of care cert/re-cert      G-Codes - 31/49/70 1242    Functional Assessment Tool Used (Outpatient only) Active/Passive range of motion, strength testing; skilled clinical observation   Functional Limitation Carrying, moving and handling objects   Carrying, Moving and Handling Objects Current Status (Y6378) At least 60 percent but less than 80 percent impaired, limited or restricted   Carrying, Moving and Handling Objects Goal Status (H8850) At least 20 percent but less than 40 percent impaired, limited or restricted      Problem List Patient Active Problem List   Diagnosis Date Noted  . Acute blood loss anemia   . Physical deconditioning   . Tracheostomy status ([place])   . Hypoxemia   . Dysphagia   . Numbness and tingling   . S/P percutaneous endoscopic gastrostomy (PEG) tube placement ([place])   . Bradycardia   . Left corneal abrasion   . Abnormal chest x-ray   . Acute respiratory failure with hypoxia ([place])   . Guillain Barr syndrome ([place])   . Atrial fibrillation with RVR ([place])   . Dysphagia, pharyngoesophageal phase   . History of ETT   . Encounter for central line placement   . Encounter for feeding tube placement   . Weakness of right lower extremity   . SOB (shortness of breath)   . Pneumonia   . Pressure injury of skin 01/16/2017  . Acute respiratory failure ([place])   . Hyponatremia with decreased serum osmolality   . Hyponatremia   . Quadriplegia and quadriparesis ([place]) 01/14/2017  . GBS (Guillain Barre syndrome) ([place]) 01/13/2017  . Weakness 01/12/2017  . Hypokalemia 01/12/2017  .  Endometriosis 12/05/2013  . Chronic low back pain 08/19/2012  . HYPERLIPIDEMIA 12/10/2010  . COLONIC POLYPS, HX OF 12/13/2009  . HYPERGLYCEMIA, FASTING 11/22/2008  . Depression 02/07/2008  .  ANOSMIA 02/07/2008  . HEADACHE, CHRONIC 02/07/2008  . MITRAL VALVE PROLAPSE 10/05/2007  . OSTEOPENIA 10/05/2007    Mariah Milling, OTR/L 02/25/2017, 4:50 PM  [facility] [address] [place], [place], [address] Phone: [phone]   Fax:  [phone]  Name: Joanne Morrison MRN: [id number] Date of Birth: [birth year]

## 2017-02-26 ENCOUNTER — Ambulatory Visit: Payer: BLUE CROSS/BLUE SHIELD | Admitting: Physical Therapy

## 2017-02-26 ENCOUNTER — Encounter: Payer: Self-pay | Admitting: Occupational Therapy

## 2017-02-26 ENCOUNTER — Ambulatory Visit: Payer: BLUE CROSS/BLUE SHIELD | Admitting: Occupational Therapy

## 2017-02-26 DIAGNOSIS — R278 Other lack of coordination: Secondary | ICD-10-CM

## 2017-02-26 DIAGNOSIS — M6281 Muscle weakness (generalized): Secondary | ICD-10-CM

## 2017-02-26 DIAGNOSIS — R41841 Cognitive communication deficit: Secondary | ICD-10-CM | POA: Diagnosis not present

## 2017-02-26 DIAGNOSIS — R2689 Other abnormalities of gait and mobility: Secondary | ICD-10-CM

## 2017-02-26 DIAGNOSIS — M25512 Pain in left shoulder: Secondary | ICD-10-CM

## 2017-02-26 DIAGNOSIS — R2681 Unsteadiness on feet: Secondary | ICD-10-CM

## 2017-02-26 DIAGNOSIS — R208 Other disturbances of skin sensation: Secondary | ICD-10-CM

## 2017-02-26 MED ORDER — FENTANYL 25 MCG/HR TD PT72: 25.0000 ug | MEDICATED_PATCH | TRANSDERMAL | 0 refills | Status: DC

## 2017-02-26 NOTE — Therapy (Signed)
[facility] [address] [place], [place], [address] Phone: [phone]   Fax:  [phone]  Occupational Therapy Treatment  Patient Details  Name: Joanne Morrison MRN: [id number] Date of Birth: 07/31/1951 Referring Provider: Dr Naaman Plummer  Encounter Date: 02/26/2017      OT End of Session - 02/26/17 1732    Visit Number 3   Number of Visits 20   Date for OT Re-Evaluation 04/25/17   Authorization Type [employer] 20 VISIT limit - no authorization required   Authorization - Visit Number 3   Authorization - Number of Visits 20   OT Start Time 1400   OT Stop Time 1445   OT Time Calculation (min) 45 min   Activity Tolerance Patient tolerated treatment well   Behavior During Therapy [facility] for tasks assessed/performed      Past Medical History:  Diagnosis Date  . Anxiety   . Arthritis    back  . Chronic cystitis   . Chronic low back pain   . Depression   . Diverticulosis of colon   . Hemorrhoids   . History of colon polyps    09-24-2009  rectal hyperplastic polyp/   and 2016 non-cancerous  . History of endometriosis   . History of panic attacks   . Hyperlipidemia   . Osteopenia   . Pelvic pain   . Sensation of pressure in bladder area   . Wears glasses     Past Surgical History:  Procedure Laterality Date  . BUNIONECTOMY Left 2004 approx  . CARPAL TUNNEL RELEASE Bilateral right 2014;  left 2007  . COLONOSCOPY W/ POLYPECTOMY  09/24/2009  . CYSTO WITH HYDRODISTENSION [name] 10/15/2016   Procedure: CYSTOSCOPY/HYDRODISTENSION AND MARCAINE INSTILLATION;  Surgeon: Rana Snare, MD;  Location: [facility];  Service: Urology;  Laterality: [name];  . CYSTO/  HYDRODISTENTION/  INSTILLATION THERAPY  1997 approx  . HYSTEROSCOPY W/D&C  11/01/2004   POLYPECTOMY  . IR GENERIC HISTORICAL  01/26/2017   IR GASTROSTOMY TUBE MOD SED 01/26/2017 Sandi Mariscal, MD [facility]  . LAPAROSCOPIC VAGINAL HYSTERECTOMY WITH SALPINGO  OOPHORECTOMY Bilateral 04/20/2012  . TUBAL LIGATION  yrs ago    There were no vitals filed for this visit.      Subjective Assessment - 02/26/17 1431    Subjective  You should have seen me three weeks ago - I could have died.     Patient is accompained by: Family member   Currently in Pain? No/denies   Pain Score 0-No pain                      OT Treatments/Exercises (OP) - 02/26/17 0001      ADLs   Home Maintenance Patienti ndicated that she folded a load of towels.  She actually carried a laundry basket across the room without her walker.  Explained to aptient that she needs to ensure her safety first. Patient indicated husband was right there "just in case"  Patient had her husband time her standing -a s she has a short term standing goal - and she stood for 5 minutes until fatigue.       Neurological Re-education Exercises   Other Exercises 1 Supine exercise- modifed closed chain on ball.  Patient fatigues quickly, and needs intermittent cueing for technique.  Patient lacks active external rotation in left shoulder.  Worked to isolate in gravity supported position.   Other Exercises 2 Worked on proximal strengthening - 4 point to/from side  sit. 4 point to childs pose for shoulder stretch, 4 point press for scapular stabilizers.                  OT Education - 02/26/17 1731    Education provided Yes          OT Short Term Goals - 02/25/17 1641      OT SHORT TERM GOAL #1   Title Patient will complete a home activity program daily with min prompting or set up    Time 4   Period Weeks   Status On-going     OT SHORT TERM GOAL #2   Title Patient will complete a home exercise program designed to improve range of motion in BUE's with no more than set up assistance   Time 4   Period Weeks   Status On-going     OT SHORT TERM GOAL #3   Title Patient will utilize bilateral upper extremities to cut a piece of meat during a meal with modified independence    Time 4   Period Weeks   Status On-going     OT SHORT TERM GOAL #4   Title Patient will don compression hose with increased time and without physical assistance   Period Weeks   Status On-going     OT SHORT TERM GOAL #5   Title Patient will demonstrate sufficient activity tolerance to complete light housekeeping task in standing for greater than 5 minutes.     Time 4   Period Weeks   Status On-going     Additional Short Term Goals   Additional Short Term Goals Yes     OT SHORT TERM GOAL #6   Title Patient will demonstrate effective mid level reach to retrieve lightweight item less than 2 lbs from chest height shelf in standing using BUE's   Time 4   Period Weeks   Status New           OT Long Term Goals - 02/25/17 1643      OT LONG TERM GOAL #1   Title Patient will complete an updated home exercise program designed for UE strengthening daily   Time 8   Period Weeks   Status On-going     OT LONG TERM GOAL #2   Title Patient will prepare simple hot meal for she and her husband with no rest breaks- 30 min of activity   Time 8   Period Weeks   Status On-going     OT LONG TERM GOAL #3   Title Patient will complete laundry task- including transporting items to washer, and transferring to dryer, folding and putting away clothing with intermittent assistance only   Time 8   Period Weeks   Status On-going     OT LONG TERM GOAL #4   Title Patient will utilize bilateral upper extremities to blow dry and style her hair with modified independence   Time 8   Period Weeks   Status On-going     OT LONG TERM GOAL #5   Title Patient will demonstrate sufficient UE/LE strength and balance to reach to floor to retrieve a lightweight (no more than 2 lb) object without loss of balance   Time 8   Period Weeks   Status On-going     Long Term Additional Goals   Additional Long Term Goals Yes     OT LONG TERM GOAL #6   Title Patient will demonstrate sufficient strength for  unilateral reach to retrieve a  3 lb object from overhead shelf while standing with right hand and left hand (not bilateral reach)     OT LONG TERM GOAL #7   Title Patient will demonstrate sufficient strength to get down to floor to clean tub, and get back up without assistance   Time 8   Period Weeks   Status New               Plan - 02/26/17 1732    Clinical Impression Statement Patient is particiating well in therapy sessions, and motivated toward goal achievement.     Rehab Potential Excellent   OT Frequency 3x / week   OT Duration 4 weeks  then 2x/week for 4 weeks   OT Treatment/Interventions Self-care/ADL training;Electrical Stimulation;Therapeutic exercise;Neuromuscular education;Energy conservation;Manual Therapy;Functional Mobility Training;DME and/or AE instruction;Visual/perceptual remediation/compensation;Cognitive remediation/compensation;Therapeutic activities;Balance training;Patient/family education   Plan Proximal UE strengthening - needs ER Left, scapular stabilizers,    Consulted and Agree with Plan of Care Patient;Family member/caregiver      Patient will benefit from skilled therapeutic intervention in order to improve the following deficits and impairments:  Decreased coordination, Decreased range of motion, Decreased endurance, Decreased safety awareness, Decreased balance, Decreased cognition, Decreased strength, Impaired perceived functional ability, Impaired vision/preception, Impaired UE functional use, Decreased knowledge of use of DME, Impaired tone, Pain, Impaired sensation, Improper body mechanics  Visit Diagnosis: Unsteadiness on feet  Muscle weakness (generalized)  Other disturbances of skin sensation  Acute pain of left shoulder  Other lack of coordination    Problem List Patient Active Problem List   Diagnosis Date Noted  . Acute blood loss anemia   . Physical deconditioning   . Tracheostomy status ([place])   . Hypoxemia   . Dysphagia    . Numbness and tingling   . S/P percutaneous endoscopic gastrostomy (PEG) tube placement ([place])   . Bradycardia   . Left corneal abrasion   . Abnormal chest x-ray   . Acute respiratory failure with hypoxia ([place])   . Guillain Barr syndrome ([place])   . Atrial fibrillation with RVR ([place])   . Dysphagia, pharyngoesophageal phase   . History of ETT   . Encounter for central line placement   . Encounter for feeding tube placement   . Weakness of right lower extremity   . SOB (shortness of breath)   . Pneumonia   . Pressure injury of skin 01/16/2017  . Acute respiratory failure ([place])   . Hyponatremia with decreased serum osmolality   . Hyponatremia   . Quadriplegia and quadriparesis ([place]) 01/14/2017  . GBS (Guillain Barre syndrome) ([place]) 01/13/2017  . Weakness 01/12/2017  . Hypokalemia 01/12/2017  . Endometriosis 12/05/2013  . Chronic low back pain 08/19/2012  . HYPERLIPIDEMIA 12/10/2010  . COLONIC POLYPS, HX OF 12/13/2009  . HYPERGLYCEMIA, FASTING 11/22/2008  . Depression 02/07/2008  . ANOSMIA 02/07/2008  . HEADACHE, CHRONIC 02/07/2008  . MITRAL VALVE PROLAPSE 10/05/2007  . OSTEOPENIA 10/05/2007    Mariah Milling 02/26/2017, 5:35 PM  [facility] [address] [place], [place], [address] Phone: [phone]   Fax:  [phone]  Name: Shamiah Kahler MRN: [id number] Date of Birth: [birth year]

## 2017-02-26 NOTE — Patient Instructions (Addendum)
HIP: Abduction - Supine (Band)    Place band around legs. Move one leg away from body. Keep knee and toes straight. Hold _5__ seconds. Use ___green_____ band. _10__ reps per set, _2__ sets per day, _5__ days per week Move both legs away from body at same time.  DO NOT DO  WITH LEGS STRAIGHT!! Keep both legs bent - lying on back - place band around knees - move out to side  Copyright  VHI. All rights reserved.  SINGLE LIMB STANCE    Stance: single leg on floor. Raise leg. Hold _10__ seconds. Repeat with other leg. _2__ reps per set, _2__ sets per day, _5__ days per week  Copyright  VHI. All rights reserved.

## 2017-02-27 NOTE — Therapy (Signed)
[facility] [address] [place], [place], [address] Phone: [phone]   Fax:  [phone]  Physical Therapy Treatment  Patient Details  Name: Joanne Morrison MRN: [id number] Date of Birth: [birth year] Referring Provider: Dr. Alger Simons  Encounter Date: 02/26/2017      PT End of Session - 02/27/17 1341    Visit Number 2   Number of Visits 16   Date for PT Re-Evaluation 04/25/17   Authorization Type BCBS   Authorization - Visit Number 2   Authorization - Number of Visits 20   PT Start Time 9767   PT Stop Time 3419   PT Time Calculation (min) 48 min      Past Medical History:  Diagnosis Date  . Anxiety   . Arthritis    back  . Chronic cystitis   . Chronic low back pain   . Depression   . Diverticulosis of colon   . Hemorrhoids   . History of colon polyps    09-24-2009  rectal hyperplastic polyp/   and 2016 non-cancerous  . History of endometriosis   . History of panic attacks   . Hyperlipidemia   . Osteopenia   . Pelvic pain   . Sensation of pressure in bladder area   . Wears glasses     Past Surgical History:  Procedure Laterality Date  . BUNIONECTOMY Left 2004 approx  . CARPAL TUNNEL RELEASE Bilateral right 2014;  left 2007  . COLONOSCOPY W/ POLYPECTOMY  09/24/2009  . CYSTO WITH HYDRODISTENSION [name] 10/15/2016   Procedure: CYSTOSCOPY/HYDRODISTENSION AND MARCAINE INSTILLATION;  Surgeon: Rana Snare, MD;  Location[facility];  Service: Urology;  Laterality: [name];  . CYSTO/  HYDRODISTENTION/  INSTILLATION THERAPY  1997 approx  . HYSTEROSCOPY W/D&C  11/01/2004   POLYPECTOMY  . IR GENERIC HISTORICAL  01/26/2017   IR GASTROSTOMY TUBE MOD SED 01/26/2017 Sandi Mariscal, MD [facility]  . LAPAROSCOPIC VAGINAL HYSTERECTOMY WITH SALPINGO OOPHORECTOMY Bilateral 04/20/2012  . TUBAL LIGATION  yrs ago    There were no vitals filed for this visit.      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 No/denies                         [facility] Adult PT Treatment/Exercise - 02/27/17 0001      Berg Balance Test   Sit to Stand Able to stand without using hands and stabilize independently   Standing Unsupported Able to stand safely 2 minutes   Sitting with Back Unsupported but Feet Supported on Floor or Stool Able to sit safely and securely 2 minutes   Stand to Sit Sits safely with minimal use of hands   Transfers Able to transfer safely, minor use of hands   Standing Unsupported with Eyes Closed Able to stand 10 seconds with supervision   Standing Ubsupported with Feet Together Able to place feet together independently and stand for 1 minute with supervision   From Standing, Reach Forward with Outstretched Arm Can reach confidently >25 cm (10")  From Standing Position, Pick up Object from [place] to pick up shoe, needs supervision   From Standing Position, Turn to Look Behind Over each Shoulder Looks behind from both sides and weight shifts well   Turn 360 Degrees Able to turn 360 degrees safely but slowly  R= 6.75  L=6.78   Standing Unsupported, Alternately Place Feet on Step/Stool Able to complete 4 steps without aid or supervision   Standing Unsupported, One Foot in Front Able to place foot tandem independently and hold 30 seconds   Standing on One Leg Tries to lift leg/unable to hold 3 seconds but remains standing independently   Total Score 46     Lumbar Exercises: Supine   Clam 10 reps  green theraband used   Bridge 10 reps    Straight Leg Raise 10 reps  bil. LE's   Other Supine Lumbar Exercises Pt performed bridging with hip abdct/adduction x 5 reps; bridging with RLE extension x 5 reps    Other Supine Lumbar Exercises Bil. hip flexion with knee flexed in supine - green band used for resistance - 10 reps each leg     Lumbar Exercises: Sidelying   Hip Abduction 10 reps  bil. LE's     Knee/Hip Exercises: Standing   Heel Raises Both;1 set;10 reps   Hip Extension AROM;Both;1 set;10 reps  bil. UE support at // bars      Pt performed SLS on RLE and on LLE - 10 sec hold - 2 reps each with UE support at parallel bar prn - added this exercise  to HEP           PT Education - 02/27/17 1340    Education provided Yes   Education Details gave hip abduction with green theraband in hooklying posiiton; SLS added to HEP   Person(s) Educated Patient;Spouse   Methods Explanation;Demonstration;Handout   Comprehension Verbalized understanding;Returned demonstration          PT Short Term Goals - 02/24/17 1519      PT SHORT TERM GOAL #1   Title Pt will ambulate household distances modified independently without use of RW.  03-27-17   Time 4   Period Weeks   Status New     PT SHORT TERM GOAL #2   Title Amb. 500' with RW with SBA for increased community accessibility.  03-27-17   Time 4   Period Weeks   Status New     PT SHORT TERM GOAL #3   Title Improve TUG score to </= 13.5 secs for decreased fall risk.  03-27-17   Baseline 15.22 secs without RW   Time 4   Period Weeks   Status New     PT SHORT TERM GOAL #4   Title Increase gait velocity to >/= 2.6 ft/sec without device for incr. gait efficiency.  03-27-17   Time 4   Period Weeks   Status New     PT SHORT TERM GOAL #5   Title Increase Berg score by at least 5 points to reduce fall risk.  03-27-17   Baseline TBA next session   Time 4   Period Weeks   Status New     Additional Short Term Goals   Additional Short Term Goals Yes     PT SHORT  TERM GOAL #6   Title Independent in HEP for lower extremity strengthening and balance exercises.  03-27-17   Time 4   Period Weeks   Status New  PT Long Term Goals - 02/24/17 1524      PT LONG TERM GOAL #1   Title Modified independent ambulation 1000' on even surfaces without device.  04-26-17   Time 8   Period Weeks   Status New     PT LONG TERM GOAL #2   Title Increase Berg score by at least 10 points for improved balance and for reduced fall risk.  04-26-17   Time 8   Period Weeks   Status New     PT LONG TERM GOAL #3   Title Improve TUG score to </= 11.5 secs without device to demo improved functional mobility.  04-26-17   Baseline 17.38 secs with RW:  15.22 secs without RW   Time 8   Period Weeks   Status New     PT LONG TERM GOAL #4   Title Negotiate steps using step over step sequence without rail modified independently.  04-26-17   Baseline SBA with use of 2 rails step by step   Time 8   Period Weeks   Status New     PT LONG TERM GOAL #5   Title Independent in updated HEP for strengthening and balance.  04-26-17   Time 8   Period Weeks   Status New               Plan - 02/27/17 1342    Clinical Impression Statement Pt's legs were visibly tremoring today - pt had OT prior to PT and stated legs were beginning to get tired.  Pt required frequent rest periods due to fatigue and decreased endurance:  LLE noted to be weaker than RLE.  Pt fatigued quickly with bridging exercises and requested to stop ex. with bridging with LE extension.                                                                                                    Rehab Potential Good   PT Frequency 2x / week  Discussed reducing frequency to 2x/week since visit limit is 20 visits - pt requests that this be reduced to maximize visits   PT Duration 8 weeks   PT Treatment/Interventions ADLs/Self Care Home Management;Gait training;Stair training;Functional mobility training;Therapeutic  activities;Therapeutic exercise;Balance training;Neuromuscular re-education;Patient/family education   PT Next Visit Plan check 2 exs (SLS & hip abdct with green band) added to HEP; cont LE strengthening; try hip ext. with red band in standing   PT Home Exercise Plan see above - plan to add theraband for LE strengthening;  add hip extension in standing to HEP   Consulted and Agree with Plan of Care Patient;Family member/caregiver   Family Member Consulted spouse Cheral Bay      Patient will benefit from skilled therapeutic intervention in order to improve the following deficits and impairments:  Abnormal gait, Decreased endurance, Decreased activity tolerance, Decreased balance, Decreased mobility, Decreased strength, Impaired vision/preception, Pain, Impaired sensation  Visit Diagnosis: Muscle weakness (generalized)  Other abnormalities of gait and mobility     Problem List Patient Active Problem List   Diagnosis Date Noted  . Acute  blood loss anemia   . Physical deconditioning   . Tracheostomy status ([place])   . Hypoxemia   . Dysphagia   . Numbness and tingling   . S/P percutaneous endoscopic gastrostomy (PEG) tube placement ([place])   . Bradycardia   . Left corneal abrasion   . Abnormal chest x-ray   . Acute respiratory failure with hypoxia ([place])   . Guillain Barr syndrome ([place])   . Atrial fibrillation with RVR ([place])   . Dysphagia, pharyngoesophageal phase   . History of ETT   . Encounter for central line placement   . Encounter for feeding tube placement   . Weakness of right lower extremity   . SOB (shortness of breath)   . Pneumonia   . Pressure injury of skin 01/16/2017  . Acute respiratory failure ([place])   . Hyponatremia with decreased serum osmolality   . Hyponatremia   . Quadriplegia and quadriparesis ([place]) 01/14/2017  . GBS (Guillain Barre syndrome) ([place]) 01/13/2017  . Weakness 01/12/2017  . Hypokalemia 01/12/2017  . Endometriosis 12/05/2013  . Chronic low back pain  08/19/2012  . HYPERLIPIDEMIA 12/10/2010  . COLONIC POLYPS, HX OF 12/13/2009  . HYPERGLYCEMIA, FASTING 11/22/2008  . Depression 02/07/2008  . ANOSMIA 02/07/2008  . HEADACHE, CHRONIC 02/07/2008  . MITRAL VALVE PROLAPSE 10/05/2007  . OSTEOPENIA 10/05/2007    Alda Lea, PT 02/27/2017, 1:49 PM  [facility] [address] [place], [place], [address] Phone: [phone]   Fax:  [phone]  Name: Sibbie Flammia MRN: [id number] Date of Birth: 08-01-1951

## 2017-03-02 ENCOUNTER — Ambulatory Visit: Payer: BLUE CROSS/BLUE SHIELD | Admitting: Physical Therapy

## 2017-03-02 DIAGNOSIS — R41841 Cognitive communication deficit: Secondary | ICD-10-CM | POA: Diagnosis not present

## 2017-03-02 DIAGNOSIS — M6281 Muscle weakness (generalized): Secondary | ICD-10-CM

## 2017-03-02 NOTE — Therapy (Signed)
[facility] [address] [place], [place], [address] Phone: [phone]   Fax:  [phone]  Physical Therapy Treatment  Patient Details  Name: Joanne Morrison MRN: [id number] Date of Birth: 1951/04/29 Referring Provider: Dr. Alger Simons  Encounter Date: 03/02/2017      PT End of Session - 03/02/17 1027    Visit Number 3   Number of Visits 16   Date for PT Re-Evaluation 04/25/17   Authorization Type BCBS   Authorization - Visit Number 3   Authorization - Number of Visits 20   PT Start Time 0845   PT Stop Time 0915  Pt had to leave 15" early for urgent eye MD appt   PT Time Calculation (min) 30 min      Past Medical History:  Diagnosis Date  . Anxiety   . Arthritis    back  . Chronic cystitis   . Chronic low back pain   . Depression   . Diverticulosis of colon   . Hemorrhoids   . History of colon polyps    09-24-2009  rectal hyperplastic polyp/   and 2016 non-cancerous  . History of endometriosis   . History of panic attacks   . Hyperlipidemia   . Osteopenia   . Pelvic pain   . Sensation of pressure in bladder area   . Wears glasses     Past Surgical History:  Procedure Laterality Date  . BUNIONECTOMY Left 2004 approx  . CARPAL TUNNEL RELEASE Bilateral right 2014;  left 2007  . COLONOSCOPY W/ POLYPECTOMY  09/24/2009  . CYSTO WITH HYDRODISTENSION [name] 10/15/2016   Procedure: CYSTOSCOPY/HYDRODISTENSION AND MARCAINE INSTILLATION;  Surgeon: Rana Snare, MD;  Location: [facility];  Service: Urology;  Laterality: [name];  . CYSTO/  HYDRODISTENTION/  INSTILLATION THERAPY  1997 approx  . HYSTEROSCOPY W/D&C  11/01/2004   POLYPECTOMY  . IR GENERIC HISTORICAL  01/26/2017   IR GASTROSTOMY TUBE MOD SED 01/26/2017 Sandi Mariscal, MD [facility]  . LAPAROSCOPIC VAGINAL HYSTERECTOMY WITH SALPINGO OOPHORECTOMY Bilateral 04/20/2012  . TUBAL LIGATION  yrs ago    There were no vitals filed for this  visit.      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 No/denies                         [facility] Adult PT Treatment/Exercise - 03/02/17 0850      Transfers   Transfers Sit to Stand   Number of Reps Other reps (comment)  2   Comments no UE support; feet on floor     Ambulation/Gait   Ambulation/Gait Yes   Ambulation/Gait Assistance 4: Min guard   Ambulation Distance (Feet) 50 Feet   Assistive device None   Gait Pattern Step-through pattern;Decreased arm swing - right;Decreased arm swing - left   Ambulation Surface Level;Indoor     Lumbar Exercises: Machines for Strengthening   Leg Press Pt performed 10 reps at  50#, 60#, 70# bil. LE's     Lumbar Exercises: Supine   Clam 10 reps  RLE only due to time constraint   Bridge 10 reps   Straight Leg Raise 10 reps  RLE and LLE   Other Supine Lumbar Exercises 1/2 bridging 10 reps each leg     Lumbar Exercises: Sidelying   Hip Abduction 10 reps  RLE and LLE     Knee/Hip Exercises: Stretches   Active Hamstring Stretch Both;1 rep;30 seconds  Runner's stretch     Knee/Hip Exercises: Aerobic   Nustep Level 5 x 5" - UE's and LE's     Knee/Hip Exercises: Standing   Heel Raises Both;1 set;10 reps                  PT Short Term Goals - 02/24/17 1519      PT SHORT TERM GOAL #1   Title Pt will ambulate household distances modified independently without use of RW.  03-27-17   Time 4    Period Weeks   Status New     PT SHORT TERM GOAL #2   Title Amb. 500' with RW with SBA for increased community accessibility.  03-27-17   Time 4   Period Weeks   Status New     PT SHORT TERM GOAL #3   Title Improve TUG score to </= 13.5 secs for decreased fall risk.  03-27-17   Baseline 15.22 secs without RW   Time 4   Period Weeks   Status New     PT SHORT TERM GOAL #4   Title Increase gait velocity to >/= 2.6 ft/sec without device for incr. gait efficiency.  03-27-17   Time 4   Period Weeks   Status New     PT SHORT TERM GOAL #5   Title Increase Berg score by at least 5 points to reduce fall risk.  03-27-17   Baseline TBA next session   Time 4   Period Weeks   Status New     Additional Short Term Goals   Additional Short Term Goals Yes     PT SHORT TERM GOAL #6   Title Independent in HEP for lower extremity strengthening and balance exercises.  03-27-17   Time 4   Period Weeks   Status New           PT Long Term Goals - 02/24/17 1524      PT LONG TERM GOAL #1   Title Modified independent ambulation 1000' on even surfaces without device.  04-26-17   Time 8   Period Weeks   Status New     PT LONG TERM GOAL #2   Title Increase Berg score by at least 10 points for improved balance and for reduced fall risk.  04-26-17   Time 8   Period Weeks   Status New     PT LONG TERM GOAL #3   Title Improve TUG score to </= 11.5 secs without device to demo improved functional mobility.  04-26-17   Baseline 17.38 secs with RW:  15.22 secs without RW   Time 8   Period Weeks   Status New     PT LONG TERM GOAL #4   Title Negotiate steps using step over step sequence without rail modified independently.  04-26-17   Baseline SBA with use of 2 rails step by step   Time 8   Period Weeks   Status New     PT LONG TERM GOAL #  5   Title Independent in updated HEP for strengthening and balance.  04-26-17   Time 8   Period Weeks   Status New               Plan -  03/02/17 1029    Clinical Impression Statement Pt's legs trembling today; pt states that it may be due to anxiety about her left eye (stitches popped loose on Saturday) and she has appt with eye doctor this am.  Requested to not attempt doing very strenuous exercises today   Rehab Potential Good   PT Frequency 2x / week   PT Duration 8 weeks   PT Treatment/Interventions ADLs/Self Care Home Management;Gait training;Stair training;Functional mobility training;Therapeutic activities;Therapeutic exercise;Balance training;Neuromuscular re-education;Patient/family education   PT Next Visit Plan check 2 exs (SLS & hip abdct with green band) added to HEP; cont LE strengthening; try hip ext. with red band in standing   PT Home Exercise Plan see above - plan to add theraband for LE strengthening;  add hip extension in standing to HEP   Consulted and Agree with Plan of Care Patient;Family member/caregiver   Family Member Consulted spouse Cheral Bay      Patient will benefit from skilled therapeutic intervention in order to improve the following deficits and impairments:  Abnormal gait, Decreased endurance, Decreased activity tolerance, Decreased balance, Decreased mobility, Decreased strength, Impaired vision/preception, Pain, Impaired sensation  Visit Diagnosis: Muscle weakness (generalized)     Problem List Patient Active Problem List   Diagnosis Date Noted  . Acute blood loss anemia   . Physical deconditioning   . Tracheostomy status ([place])   . Hypoxemia   . Dysphagia   . Numbness and tingling   . S/P percutaneous endoscopic gastrostomy (PEG) tube placement ([place])   . Bradycardia   . Left corneal abrasion   . Abnormal chest x-ray   . Acute respiratory failure with hypoxia ([place])   . Guillain Barr syndrome ([place])   . Atrial fibrillation with RVR ([place])   . Dysphagia, pharyngoesophageal phase   . History of ETT   . Encounter for central line placement   . Encounter for feeding tube placement    . Weakness of right lower extremity   . SOB (shortness of breath)   . Pneumonia   . Pressure injury of skin 01/16/2017  . Acute respiratory failure ([place])   . Hyponatremia with decreased serum osmolality   . Hyponatremia   . Quadriplegia and quadriparesis ([place]) 01/14/2017  . GBS (Guillain Barre syndrome) ([place]) 01/13/2017  . Weakness 01/12/2017  . Hypokalemia 01/12/2017  . Endometriosis 12/05/2013  . Chronic low back pain 08/19/2012  . HYPERLIPIDEMIA 12/10/2010  . COLONIC POLYPS, HX OF 12/13/2009  . HYPERGLYCEMIA, FASTING 11/22/2008  . Depression 02/07/2008  . ANOSMIA 02/07/2008  . HEADACHE, CHRONIC 02/07/2008  . MITRAL VALVE PROLAPSE 10/05/2007  . OSTEOPENIA 10/05/2007    Alda Lea, PT 03/02/2017, 10:35 AM  [facility] [address] [place], [place], [address] Phone: [phone]   Fax:  [phone]  Name: Joanne Morrison MRN: [id number] Date of Birth: 25-Apr-1951

## 2017-03-03 ENCOUNTER — Ambulatory Visit: Payer: BLUE CROSS/BLUE SHIELD | Admitting: Occupational Therapy

## 2017-03-03 DIAGNOSIS — R41841 Cognitive communication deficit: Secondary | ICD-10-CM | POA: Diagnosis not present

## 2017-03-03 DIAGNOSIS — M25512 Pain in left shoulder: Secondary | ICD-10-CM

## 2017-03-03 DIAGNOSIS — M6281 Muscle weakness (generalized): Secondary | ICD-10-CM

## 2017-03-03 NOTE — Patient Instructions (Signed)
   Scapular Retraction (Prone)    Lie with arms at sides. Pinch shoulder blades together - shoulders up towards ceiling, NOT ears. Repeat _10___ times per set.  Do _2-3___ sessions per day.   Extension - Prone (Dumbbell)    Lie with right arm hanging off side of bed. Lift hand back and up using 1 lb. weight. Repeat _10___ times per set.  Do __2-3__ sessions per day. Use _1___ lb weight.   All Fours Shoulder Flexion / Extension    Place hands and knees shoulder-width apart, rock back and sit on legs, then rock forward over hands and forearms, controlled tummy. Repeat __10__ times or for __5__ seconds. Do __2-3__ sessions per day. Do NOT go as far as picture   SELF ASSISTED WITH OBJECT: Shoulder Flexion - Supine    Hold BALL with both hands. Raise arms overhead, keep elbows straight. _10__ reps per set, 2-3 sessions/day.   Copyright  [employer]. All rights reserved.     Circle (Supine)    Lie on back, holding _1_ pound dumbbell above chest in right/ then left hand. Move arm in small circles clockwise, then counterclockwise, arm straight. Repeat 5__ times. Repeat with other hand for set.  Do 2-3__ sets per day.   Continue 1 exercise from video for external rotation of Lt shoulder

## 2017-03-03 NOTE — Therapy (Signed)
[facility] [address] [place], [place], [address] Phone: [phone]   Fax:  [phone]  Occupational Therapy Treatment  Patient Details  Name: Joanne Morrison MRN: [id number] Date of Birth: 10-27-1951 Referring Provider: Dr Naaman Plummer  Encounter Date: 03/03/2017      OT End of Session - 03/03/17 1251    Visit Number 4   Number of Visits 20   Date for OT Re-Evaluation 04/25/17   Authorization Type [employer] 20 VISIT limit - no authorization required   Authorization - Visit Number 4   Authorization - Number of Visits 20   OT Start Time 0850   OT Stop Time 0935   OT Time Calculation (min) 45 min   Activity Tolerance Patient tolerated treatment well      Past Medical History:  Diagnosis Date  . Anxiety   . Arthritis    back  . Chronic cystitis   . Chronic low back pain   . Depression   . Diverticulosis of colon   . Hemorrhoids   . History of colon polyps    09-24-2009  rectal hyperplastic polyp/   and 2016 non-cancerous  . History of endometriosis   . History of panic attacks   . Hyperlipidemia   . Osteopenia   . Pelvic pain   . Sensation of pressure in bladder area   . Wears glasses     Past Surgical History:  Procedure Laterality Date  . BUNIONECTOMY Left 2004 approx  . CARPAL TUNNEL RELEASE Bilateral right 2014;  left 2007  . COLONOSCOPY W/ POLYPECTOMY  09/24/2009  . CYSTO WITH HYDRODISTENSION [name] 10/15/2016   Procedure: CYSTOSCOPY/HYDRODISTENSION AND MARCAINE INSTILLATION;  Surgeon: Rana Snare, MD;  Location: [facility];  Service: Urology;  Laterality: [name];  . CYSTO/  HYDRODISTENTION/  INSTILLATION THERAPY  1997 approx  . HYSTEROSCOPY W/D&C  11/01/2004   POLYPECTOMY  . IR GENERIC HISTORICAL  01/26/2017   IR GASTROSTOMY TUBE MOD SED 01/26/2017 Sandi Mariscal, MD [facility]  . LAPAROSCOPIC VAGINAL HYSTERECTOMY WITH SALPINGO OOPHORECTOMY Bilateral 04/20/2012  . TUBAL LIGATION  yrs ago     There were no vitals filed for this visit.      Subjective Assessment - 03/03/17 0854    Subjective  It affected my eye too   Patient is accompained by: Family member   Currently in Pain? Yes   Pain Score 4    Pain Location Arm   Pain Orientation Right;Left   Pain Descriptors / Indicators Sore   Pain Type Chronic pain   Pain Frequency Intermittent            [facility] OT Assessment - 03/03/17 0001      Observation/Other Assessments   Observations Noted significant scapula winging RUE. Pt goes into abduction and some IR when lifting LUE. Pt unable to perform ER LUE in gravity eliminated plane, therefore practiced into gravity in supine.                   OT Treatments/Exercises (OP) - 03/03/17 0001      Neurological Re-education Exercises   Scapular Stabilization Supine;Prone;Quadraped   Other Exercises 1 Supine: BUE sh. flexion with ball to control movement, prevent compensations LUE, and stabalize scapula RUE. Progressed to small circumduction ex's at 90* sh. flexion RUE for scapula stabalization/strengthening with 1 lb. weight   Other Exercises 2 Supine: LUE ER into gravity with upper arm supported to prevent abd. Pt's husband videotaped for proper technique. Prone: scapula retraction  bilaterally for strengthening, followed by sh. extension off EOB with 1 lb. weight - Pt able to control to keep neutral rotation of arm and facilitate downward rotation of scapula   Other Weight-Bearing Exercises 1 Quadraped: worked on slow controlled movements with A/P wt. shifts in small range. Noted increased scapula winging Rt side with Ant wt. shifts - cued pt to move smaller and controlled with abdominal involvement to decrease winging.                 OT Education - 03/03/17 1130    Education provided Yes   Education Details Pt/husband educated in the exercises performed in treatment to do at home, however unable to issue handout d/t time constraints    Person(s)  Educated Patient;Spouse   Methods Explanation;Demonstration;Verbal cues   Comprehension Verbalized understanding;Returned demonstration;Verbal cues required          OT Short Term Goals - 02/25/17 1641      OT SHORT TERM GOAL #1   Title Patient will complete a home activity program daily with min prompting or set up    Time 4   Period Weeks   Status On-going     OT SHORT TERM GOAL #2   Title Patient will complete a home exercise program designed to improve range of motion in BUE's with no more than set up assistance   Time 4   Period Weeks   Status On-going     OT SHORT TERM GOAL #3   Title Patient will utilize bilateral upper extremities to cut a piece of meat during a meal with modified independence   Time 4   Period Weeks   Status On-going     OT SHORT TERM GOAL #4   Title Patient will don compression hose with increased time and without physical assistance   Period Weeks   Status On-going     OT SHORT TERM GOAL #5   Title Patient will demonstrate sufficient activity tolerance to complete light housekeeping task in standing for greater than 5 minutes.     Time 4   Period Weeks   Status On-going     Additional Short Term Goals   Additional Short Term Goals Yes     OT SHORT TERM GOAL #6   Title Patient will demonstrate effective mid level reach to retrieve lightweight item less than 2 lbs from chest height shelf in standing using BUE's   Time 4   Period Weeks   Status New           OT Long Term Goals - 02/25/17 1643      OT LONG TERM GOAL #1   Title Patient will complete an updated home exercise program designed for UE strengthening daily   Time 8   Period Weeks   Status On-going     OT LONG TERM GOAL #2   Title Patient will prepare simple hot meal for she and her husband with no rest breaks- 30 min of activity   Time 8   Period Weeks   Status On-going     OT LONG TERM GOAL #3   Title Patient will complete laundry task- including transporting items  to washer, and transferring to dryer, folding and putting away clothing with intermittent assistance only   Time 8   Period Weeks   Status On-going     OT LONG TERM GOAL #4   Title Patient will utilize bilateral upper extremities to blow dry and style her hair with modified independence  Time 8   Period Weeks   Status On-going     OT LONG TERM GOAL #5   Title Patient will demonstrate sufficient UE/LE strength and balance to reach to floor to retrieve a lightweight (no more than 2 lb) object without loss of balance   Time 8   Period Weeks   Status On-going     Long Term Additional Goals   Additional Long Term Goals Yes     OT LONG TERM GOAL #6   Title Patient will demonstrate sufficient strength for unilateral reach to retrieve a 3 lb object from overhead shelf while standing with right hand and left hand (not bilateral reach)     OT LONG TERM GOAL #7   Title Patient will demonstrate sufficient strength to get down to floor to clean tub, and get back up without assistance   Time 8   Period Weeks   Status New               Plan - 03/03/17 1252    Clinical Impression Statement Pt appears very motivated to improve UE function. Noted scapula winging Rt side, and goes into abduction LUE.    Rehab Potential Excellent   OT Frequency 3x / week   OT Duration 4 weeks  then 2x/wk for 4 weeks   OT Treatment/Interventions Self-care/ADL training;Electrical Stimulation;Therapeutic exercise;Neuromuscular education;Energy conservation;Manual Therapy;Functional Mobility Training;DME and/or AE instruction;Visual/perceptual remediation/compensation;Cognitive remediation/compensation;Therapeutic activities;Balance training;Patient/family education   Plan Issue HEP already printed on 03/03/17 and review with patient/husband, continue scapula stabilization and controlled proximal UE strengthening as able   OT Home Exercise Plan Initiated HEP shoulders    Consulted and Agree with Plan of Care  Patient;Family member/caregiver      Patient will benefit from skilled therapeutic intervention in order to improve the following deficits and impairments:  Decreased coordination, Decreased range of motion, Decreased endurance, Decreased safety awareness, Decreased balance, Decreased cognition, Decreased strength, Impaired perceived functional ability, Impaired vision/preception, Impaired UE functional use, Decreased knowledge of use of DME, Impaired tone, Pain, Impaired sensation, Improper body mechanics  Visit Diagnosis: Muscle weakness (generalized)  Acute pain of left shoulder    Problem List Patient Active Problem List   Diagnosis Date Noted  . Acute blood loss anemia   . Physical deconditioning   . Tracheostomy status ([place])   . Hypoxemia   . Dysphagia   . Numbness and tingling   . S/P percutaneous endoscopic gastrostomy (PEG) tube placement ([place])   . Bradycardia   . Left corneal abrasion   . Abnormal chest x-ray   . Acute respiratory failure with hypoxia ([place])   . Guillain Barr syndrome ([place])   . Atrial fibrillation with RVR ([place])   . Dysphagia, pharyngoesophageal phase   . History of ETT   . Encounter for central line placement   . Encounter for feeding tube placement   . Weakness of right lower extremity   . SOB (shortness of breath)   . Pneumonia   . Pressure injury of skin 01/16/2017  . Acute respiratory failure ([place])   . Hyponatremia with decreased serum osmolality   . Hyponatremia   . Quadriplegia and quadriparesis ([place]) 01/14/2017  . GBS (Guillain Barre syndrome) ([place]) 01/13/2017  . Weakness 01/12/2017  . Hypokalemia 01/12/2017  . Endometriosis 12/05/2013  . Chronic low back pain 08/19/2012  . HYPERLIPIDEMIA 12/10/2010  . COLONIC POLYPS, HX OF 12/13/2009  . HYPERGLYCEMIA, FASTING 11/22/2008  . Depression 02/07/2008  . ANOSMIA 02/07/2008  . HEADACHE, CHRONIC 02/07/2008  .  MITRAL VALVE PROLAPSE 10/05/2007  . OSTEOPENIA 10/05/2007    Carey Bullocks, OTR/L 03/03/2017, 12:55 PM  [facility] [address] [place], [place], [address] Phone: [phone]   Fax:  [phone]  Name: Joanne Morrison MRN: [id number] Date of Birth: December 15, 1951

## 2017-03-04 ENCOUNTER — Encounter
Payer: BLUE CROSS/BLUE SHIELD | Attending: Physical Medicine & Rehabilitation | Admitting: Physical Medicine & Rehabilitation

## 2017-03-04 ENCOUNTER — Encounter: Payer: Self-pay | Admitting: Physical Medicine & Rehabilitation

## 2017-03-04 ENCOUNTER — Ambulatory Visit: Payer: BLUE CROSS/BLUE SHIELD | Admitting: Occupational Therapy

## 2017-03-04 VITALS — BP 153/85 | HR 88

## 2017-03-04 DIAGNOSIS — L299 Pruritus, unspecified: Secondary | ICD-10-CM | POA: Insufficient documentation

## 2017-03-04 DIAGNOSIS — M8580 Other specified disorders of bone density and structure, unspecified site: Secondary | ICD-10-CM | POA: Diagnosis not present

## 2017-03-04 DIAGNOSIS — E785 Hyperlipidemia, unspecified: Secondary | ICD-10-CM | POA: Diagnosis not present

## 2017-03-04 DIAGNOSIS — R41841 Cognitive communication deficit: Secondary | ICD-10-CM | POA: Diagnosis not present

## 2017-03-04 DIAGNOSIS — Z8249 Family history of ischemic heart disease and other diseases of the circulatory system: Secondary | ICD-10-CM | POA: Insufficient documentation

## 2017-03-04 DIAGNOSIS — R1312 Dysphagia, oropharyngeal phase: Secondary | ICD-10-CM | POA: Diagnosis not present

## 2017-03-04 DIAGNOSIS — F419 Anxiety disorder, unspecified: Secondary | ICD-10-CM | POA: Insufficient documentation

## 2017-03-04 DIAGNOSIS — I4891 Unspecified atrial fibrillation: Secondary | ICD-10-CM | POA: Diagnosis not present

## 2017-03-04 DIAGNOSIS — Z8262 Family history of osteoporosis: Secondary | ICD-10-CM | POA: Insufficient documentation

## 2017-03-04 DIAGNOSIS — Z79899 Other long term (current) drug therapy: Secondary | ICD-10-CM | POA: Diagnosis not present

## 2017-03-04 DIAGNOSIS — R2681 Unsteadiness on feet: Secondary | ICD-10-CM

## 2017-03-04 DIAGNOSIS — H02206 Unspecified lagophthalmos left eye, unspecified eyelid: Secondary | ICD-10-CM | POA: Insufficient documentation

## 2017-03-04 DIAGNOSIS — G8250 Quadriplegia, unspecified: Secondary | ICD-10-CM | POA: Insufficient documentation

## 2017-03-04 DIAGNOSIS — M545 Low back pain, unspecified: Secondary | ICD-10-CM

## 2017-03-04 DIAGNOSIS — G8929 Other chronic pain: Secondary | ICD-10-CM | POA: Insufficient documentation

## 2017-03-04 DIAGNOSIS — R001 Bradycardia, unspecified: Secondary | ICD-10-CM | POA: Insufficient documentation

## 2017-03-04 DIAGNOSIS — G610 Guillain-Barre syndrome: Secondary | ICD-10-CM | POA: Insufficient documentation

## 2017-03-04 DIAGNOSIS — Z931 Gastrostomy status: Secondary | ICD-10-CM | POA: Diagnosis not present

## 2017-03-04 DIAGNOSIS — F329 Major depressive disorder, single episode, unspecified: Secondary | ICD-10-CM | POA: Diagnosis not present

## 2017-03-04 DIAGNOSIS — R208 Other disturbances of skin sensation: Secondary | ICD-10-CM

## 2017-03-04 DIAGNOSIS — M6281 Muscle weakness (generalized): Secondary | ICD-10-CM

## 2017-03-04 DIAGNOSIS — M25512 Pain in left shoulder: Secondary | ICD-10-CM

## 2017-03-04 MED ORDER — FENTANYL 12 MCG/HR TD PT72: 12.5000 ug | MEDICATED_PATCH | TRANSDERMAL | 0 refills | Status: DC

## 2017-03-04 MED ORDER — TRAMADOL HCL 50 MG PO TABS: 50.0000 mg | ORAL_TABLET | Freq: Four times a day (QID) | ORAL | 1 refills | Status: DC | PRN

## 2017-03-04 NOTE — Progress Notes (Signed)
Subjective:    Patient ID: Joanne Morrison, female    DOB: 04/27/1951, 66 y.o.   MRN: [id number]  HPI   This is a Transitional Care Visit for Joanne Morrison.  She is here in follow up of her GBS. She continues to see PT, OT, SLP for her functional deficits. She is addressing her upper extremity ROM, gait. She is walking without walker often now, using it only for longer dx outside the home.   She is having minimal pain. She remains on fentanyl patch 20mcg/hr for pain control. She tried to stop but couldn't do so because of pain in her legs. She didn't tolerate gabapentin in the hospital.   Her appetite has been good. She is moving her bowels and bladder without problems. Mood has been stable. She is anxious to get her PEG out  Pain Inventory Average Pain 3 Pain Right Now 1 My pain is dull  In the last 24 hours, has pain interfered with the following? General activity 6 Relation with others 1 Enjoyment of life 8 What TIME of day is your pain at its worst? evening Sleep (in general) Fair  Pain is worse with: bending and some activites Pain improves with: medication Relief from Meds: 0  Mobility use a walker ability to climb steps?  no do you drive?  no  Function retired I need assistance with the following:  bathing, meal prep, household duties and shopping  Neuro/Psych weakness numbness tremor tingling trouble walking anxiety  Prior Studies Any changes since last visit?  no  Physicians involved in your care Any changes since last visit?  no   Family History  Problem Relation Age of Onset  . Hypertension Mother   . Dementia Mother   . Lung cancer Mother     smoker  . Osteoporosis Mother   . Heart attack Father 37  . Hypertension Brother   . Pulmonary embolism Brother 60    ?etilology  . Diabetes Maternal Aunt   . Cancer Maternal Uncle     RENAL  . Heart attack Maternal Grandmother 86  . Lung cancer Maternal Grandfather     Ecologist  . Anesthesia  problems Neg Hx   . Stroke Neg Hx    Social History   Social History  . Marital status: Married    Spouse name: [name]  . Number of children: [name]  . Years of education: [name]   Social History Main Topics  . Smoking status: Never Smoker  . Smokeless tobacco: Never Used  . Alcohol use No  . Drug use: No  . Sexual activity: Yes    Birth control/ protection: None   Other Topics Concern  . Not on file   Social History Narrative  . No narrative on file   Past Surgical History:  Procedure Laterality Date  . BUNIONECTOMY Left 2004 approx  . CARPAL TUNNEL RELEASE Bilateral right 2014;  left 2007  . COLONOSCOPY W/ POLYPECTOMY  09/24/2009  . CYSTO WITH HYDRODISTENSION [name] 10/15/2016   Procedure: CYSTOSCOPY/HYDRODISTENSION AND MARCAINE INSTILLATION;  Surgeon: Rana Snare, MD;  Location: [facility];  Service: Urology;  Laterality: [name];  . CYSTO/  HYDRODISTENTION/  INSTILLATION THERAPY  1997 approx  . HYSTEROSCOPY W/D&C  11/01/2004   POLYPECTOMY  . IR GENERIC HISTORICAL  01/26/2017   IR GASTROSTOMY TUBE MOD SED 01/26/2017 Sandi Mariscal, MD [facility]  . LAPAROSCOPIC VAGINAL HYSTERECTOMY WITH SALPINGO OOPHORECTOMY Bilateral 04/20/2012  . TUBAL LIGATION  yrs ago   Past Medical  History:  Diagnosis Date  . Anxiety   . Arthritis    back  . Chronic cystitis   . Chronic low back pain   . Depression   . Diverticulosis of colon   . Hemorrhoids   . History of colon polyps    09-24-2009  rectal hyperplastic polyp/   and 2016 non-cancerous  . History of endometriosis   . History of panic attacks   . Hyperlipidemia   . Osteopenia   . Pelvic pain   . Sensation of pressure in bladder area   . Wears glasses    There were no vitals taken for this visit.  Opioid Risk Score:   Fall Risk Score:  `1  Depression screen PHQ 2/9  Depression screen PHQ 2/9 12/05/2013  Decreased Interest 0  Down, Depressed, Hopeless 0  PHQ - 2 Score 0    Review of Systems  Constitutional:  Negative.   HENT: Negative.   Eyes: Negative.   Respiratory: Negative.   Cardiovascular: Negative.   Gastrointestinal: Negative.   Endocrine: Negative.   Genitourinary: Negative.   Musculoskeletal: Negative.   Skin: Negative.   Allergic/Immunologic: Negative.   Neurological: Negative.   Psychiatric/Behavioral: Negative.   All other systems reviewed and are negative.      Objective:   Physical Exam  Constitutional: She appears well-developed and well-nourished. NAD.  HENT: Normocephalic. Atraumatic.  Eyes: Left lid with attached weight. Sclera with minimal irritation Neck: trach scar Cardiovascular: RRR Respiratory: CTA B.  GI: Soft. Bowel sounds are normal. +PEG--site clean Musculoskeletal: She exhibits no edema or tenderness. Some atrophy of shoulder girdle including traps/rhomboids, left more than right normal PROM, ?decrease AROM left shoulder Neurological: She is alert and oriented.  Motor:  LUE: 4+/5 proximally, 4+/5 distally   RUE: 4+/5  Decreased sensation over toes which is resolving  left CN 7 injury with facial droop. Almost able to close left eye lid Ambulated without walker. Gait slightly wide based---good balance and weight shift. Skin: Skin is warm and dry. Irritation on back from scratching. Don't see frank rash Psychiatric: Normal mood and behavior.        Assessment & Plan:  Medical Problem List and Plan: 1. 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    -pt is making nice gains  -continue with therapy addressing balance, upper extremity strengthening, oral-facial control, etc 2. Pain Management: Lidoderm patch, Duragesic patch--  -will decrease fentanyl to 8mcg. wil seek insurance coverage for the medication---goal is to wean off over the next month or so  -tramadol 50mg  q6 prn #60 was rx'ed to help transition 3. Mood: Zoloft 50 mg daily, Ativan as needed 4. Gastrostomy tube 01/26/2017 per interventional radiology. Diet advanced to regular  02/04/2017 after modified barium swallow Follow-up speech therapy                -needs G tube removed at end of month  -will refer to INR for removal  -if needed I can remove it here 10. Atrial fibrillation/bradycardia. HR controlled                            11. 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 Underwent tarorhaphy 02/04/2017. Continue erythromycin ointment for now follow-up ophthalmology services as needed, Contacted ophthalmology advised continued eyedrops.             -eye improving  -follow up per optho  -discussed options to help protect eye while sleeping 13. Pruritus: likely  fentanyl related--continue to wean to off  -can use benadryl prn---this should resolve completely

## 2017-03-04 NOTE — Patient Instructions (Signed)
PLEASE FEEL FREE TO CALL OUR OFFICE WITH ANY PROBLEMS OR QUESTIONS ([phone])      

## 2017-03-04 NOTE — Therapy (Signed)
[facility] [address] [place], [place], [address] Phone: [phone]   Fax:  [phone]  Occupational Therapy Treatment  Patient Details  Name: Joanne Morrison MRN: [id number] Date of Birth: 1951-11-12 Referring Provider: Dr Naaman Plummer  Encounter Date: 03/04/2017      OT End of Session - 03/04/17 0855    Visit Number 5   Number of Visits 20   Date for OT Re-Evaluation 04/25/17   Authorization Type [employer] 20 VISIT limit - no authorization required   Authorization - Visit Number 5   Authorization - Number of Visits 20   OT Start Time 0850   OT Stop Time 0930   OT Time Calculation (min) 40 min   Activity Tolerance Patient tolerated treatment well   Behavior During Therapy [facility] for tasks assessed/performed      Past Medical History:  Diagnosis Date  . Anxiety   . Arthritis    back  . Chronic cystitis   . Chronic low back pain   . Depression   . Diverticulosis of colon   . Hemorrhoids   . History of colon polyps    09-24-2009  rectal hyperplastic polyp/   and 2016 non-cancerous  . History of endometriosis   . History of panic attacks   . Hyperlipidemia   . Osteopenia   . Pelvic pain   . Sensation of pressure in bladder area   . Wears glasses     Past Surgical History:  Procedure Laterality Date  . BUNIONECTOMY Left 2004 approx  . CARPAL TUNNEL RELEASE Bilateral right 2014;  left 2007  . COLONOSCOPY W/ POLYPECTOMY  09/24/2009  . CYSTO WITH HYDRODISTENSION [name] 10/15/2016   Procedure: CYSTOSCOPY/HYDRODISTENSION AND MARCAINE INSTILLATION;  Surgeon: Rana Snare, MD;  Location: [facility];  Service: Urology;  Laterality: [name];  . CYSTO/  HYDRODISTENTION/  INSTILLATION THERAPY  1997 approx  . HYSTEROSCOPY W/D&C  11/01/2004   POLYPECTOMY  . IR GENERIC HISTORICAL  01/26/2017   IR GASTROSTOMY TUBE MOD SED 01/26/2017 Sandi Mariscal, MD [facility]  . LAPAROSCOPIC VAGINAL HYSTERECTOMY WITH SALPINGO  OOPHORECTOMY Bilateral 04/20/2012  . TUBAL LIGATION  yrs ago    There were no vitals filed for this visit.      Subjective Assessment - 03/04/17 0852    Subjective  Denies pain   Currently in Pain? No/denies              Arm bike x 5 mins level 1 for conditioning, min v.c. For positioning                OT Education - 03/03/17 1130    Education provided Yes   Education Details Pt/husband educated in the exercises performed in treatment to do at home, however unable to issue handout d/t time constraints    Person(s) Educated Patient;Spouse   Methods Explanation;Demonstration;Verbal cues   Comprehension Verbalized understanding;Returned demonstration;Verbal cues required          OT Short Term Goals - 02/25/17 1641      OT SHORT TERM GOAL #1   Title Patient will complete a home activity program daily with min prompting or set up    Time 4   Period Weeks   Status On-going     OT SHORT TERM GOAL #2   Title Patient will complete a home exercise program designed to improve range of motion in BUE's with no more than set up assistance   Time 4   Period Weeks  Status On-going     OT SHORT TERM GOAL #3   Title Patient will utilize bilateral upper extremities to cut a piece of meat during a meal with modified independence   Time 4   Period Weeks   Status On-going     OT SHORT TERM GOAL #4   Title Patient will don compression hose with increased time and without physical assistance   Period Weeks   Status On-going     OT SHORT TERM GOAL #5   Title Patient will demonstrate sufficient activity tolerance to complete light housekeeping task in standing for greater than 5 minutes.     Time 4   Period Weeks   Status On-going     Additional Short Term Goals   Additional Short Term Goals Yes     OT SHORT TERM GOAL #6   Title Patient will demonstrate effective mid level reach to retrieve lightweight item less than 2 lbs from chest height shelf in standing  using BUE's   Time 4   Period Weeks   Status New           OT Long Term Goals - 02/25/17 1643      OT LONG TERM GOAL #1   Title Patient will complete an updated home exercise program designed for UE strengthening daily   Time 8   Period Weeks   Status On-going     OT LONG TERM GOAL #2   Title Patient will prepare simple hot meal for she and her husband with no rest breaks- 30 min of activity   Time 8   Period Weeks   Status On-going     OT LONG TERM GOAL #3   Title Patient will complete laundry task- including transporting items to washer, and transferring to dryer, folding and putting away clothing with intermittent assistance only   Time 8   Period Weeks   Status On-going     OT LONG TERM GOAL #4   Title Patient will utilize bilateral upper extremities to blow dry and style her hair with modified independence   Time 8   Period Weeks   Status On-going     OT LONG TERM GOAL #5   Title Patient will demonstrate sufficient UE/LE strength and balance to reach to floor to retrieve a lightweight (no more than 2 lb) object without loss of balance   Time 8   Period Weeks   Status On-going     Long Term Additional Goals   Additional Long Term Goals Yes     OT LONG TERM GOAL #6   Title Patient will demonstrate sufficient strength for unilateral reach to retrieve a 3 lb object from overhead shelf while standing with right hand and left hand (not bilateral reach)     OT LONG TERM GOAL #7   Title Patient will demonstrate sufficient strength to get down to floor to clean tub, and get back up without assistance   Time 8   Period Weeks   Status New               Plan - 03/04/17 0944    Clinical Impression Statement Pt is progressing towards goals. Pt and husband demonstrate understanding of updated HEP.   Rehab Potential Excellent   OT Frequency 3x / week   OT Duration 4 weeks  then 2x week x 4 weeks   OT Treatment/Interventions Self-care/ADL training;Electrical  Stimulation;Therapeutic exercise;Neuromuscular education;Energy conservation;Manual Therapy;Functional Mobility Training;DME and/or AE instruction;Visual/perceptual remediation/compensation;Cognitive remediation/compensation;Therapeutic activities;Balance training;Patient/family education  Plan continue scapular stabilization, work towards unmet goals   OT Home Exercise Plan Initiated HEP shoulders    Consulted and Agree with Plan of Care Patient;Family member/caregiver      Patient will benefit from skilled therapeutic intervention in order to improve the following deficits and impairments:  Decreased coordination, Decreased range of motion, Decreased endurance, Decreased safety awareness, Decreased balance, Decreased cognition, Decreased strength, Impaired perceived functional ability, Impaired vision/preception, Impaired UE functional use, Decreased knowledge of use of DME, Impaired tone, Pain, Impaired sensation, Improper body mechanics  Visit Diagnosis: Muscle weakness (generalized)  Acute pain of left shoulder  Unsteadiness on feet  Other disturbances of skin sensation    Problem List Patient Active Problem List   Diagnosis Date Noted  . Acute blood loss anemia   . Physical deconditioning   . Tracheostomy status ([place])   . Hypoxemia   . Dysphagia   . Numbness and tingling   . S/P percutaneous endoscopic gastrostomy (PEG) tube placement ([place])   . Bradycardia   . Left corneal abrasion   . Abnormal chest x-ray   . Acute respiratory failure with hypoxia ([place])   . Guillain Barr syndrome ([place])   . Atrial fibrillation with RVR ([place])   . Dysphagia, pharyngoesophageal phase   . History of ETT   . Encounter for central line placement   . Encounter for feeding tube placement   . Weakness of right lower extremity   . SOB (shortness of breath)   . Pneumonia   . Pressure injury of skin 01/16/2017  . Acute respiratory failure ([place])   . Hyponatremia with decreased serum osmolality    . Hyponatremia   . Quadriplegia and quadriparesis ([place]) 01/14/2017  . GBS (Guillain Barre syndrome) ([place]) 01/13/2017  . Weakness 01/12/2017  . Hypokalemia 01/12/2017  . Endometriosis 12/05/2013  . Chronic low back pain 08/19/2012  . HYPERLIPIDEMIA 12/10/2010  . COLONIC POLYPS, HX OF 12/13/2009  . HYPERGLYCEMIA, FASTING 11/22/2008  . Depression 02/07/2008  . ANOSMIA 02/07/2008  . HEADACHE, CHRONIC 02/07/2008  . MITRAL VALVE PROLAPSE 10/05/2007  . OSTEOPENIA 10/05/2007    Woodie Trusty 03/04/2017, 9:53 AM  [facility] [address] [place], [place], [address] Phone: [phone]   Fax:  [phone]  Name: Joanne Morrison MRN: [id number] Date of Birth: May 16, 1951

## 2017-03-04 NOTE — Patient Instructions (Signed)
Scapular Retraction (Prone)    Lie with arms at sides. Pinch shoulder blades together - shoulders up towards ceiling, NOT ears. Repeat _10___ times per set.  Do _2-3___ sessions per day.    Lie with right arm hanging off side of bed. Lift hand back and up using 1 lb. weight. Repeat _10___ times per set.  Do __2-3__ sessions per day. Use _1___ lb weight.   All Fours Shoulder Flexion / Extension    Place hands and knees shoulder-width apart, rock back and sit on legs, then rock forward over hands and forearms, controlled tummy. Repeat __10__ times or for __5__ seconds. Do __2-3__ sessions per day. Do NOT go as far as picture   SELF ASSISTED WITH OBJECT: Shoulder Flexion - Supine    Hold BALL with both hands. Raise arms overhead, keep elbows straight. _10__ reps per set, 2-3 sessions/day.   Copyright  VHI. All rights reserved.     Circle (Supine)    Lie on back, holding _1_ pound dumbbell above chest in right/ then left hand. Move arm in small circles clockwise, then counterclockwise, arm straight. Repeat 5__ times. Repeat with other hand for set.  Do 2-3__ sets per day.   Continue 1 exercise from video for external rotation of Lt shoulder

## 2017-03-05 ENCOUNTER — Other Ambulatory Visit: Payer: Self-pay | Admitting: Physical Medicine & Rehabilitation

## 2017-03-05 ENCOUNTER — Ambulatory Visit: Payer: BLUE CROSS/BLUE SHIELD | Admitting: Physical Therapy

## 2017-03-05 DIAGNOSIS — R1312 Dysphagia, oropharyngeal phase: Secondary | ICD-10-CM

## 2017-03-06 ENCOUNTER — Encounter: Payer: Self-pay | Admitting: Physical Therapy

## 2017-03-06 ENCOUNTER — Ambulatory Visit: Payer: BLUE CROSS/BLUE SHIELD | Admitting: Occupational Therapy

## 2017-03-06 ENCOUNTER — Ambulatory Visit: Payer: BLUE CROSS/BLUE SHIELD | Admitting: Physical Therapy

## 2017-03-06 DIAGNOSIS — M6281 Muscle weakness (generalized): Secondary | ICD-10-CM

## 2017-03-06 DIAGNOSIS — R2689 Other abnormalities of gait and mobility: Secondary | ICD-10-CM

## 2017-03-06 DIAGNOSIS — R2681 Unsteadiness on feet: Secondary | ICD-10-CM

## 2017-03-06 DIAGNOSIS — M25512 Pain in left shoulder: Secondary | ICD-10-CM

## 2017-03-06 DIAGNOSIS — R41841 Cognitive communication deficit: Secondary | ICD-10-CM | POA: Diagnosis not present

## 2017-03-06 NOTE — Therapy (Signed)
[facility] [address] [place], [place], [address] Phone: [phone]   Fax:  [phone]  Occupational Therapy Treatment  Patient Details  Name: Joanne Morrison MRN: [id number] Date of Birth: 1951/01/21 Referring Provider: Dr Naaman Plummer  Encounter Date: 03/06/2017      OT End of Session - 03/06/17 1244    Visit Number 6   Number of Visits 20   Date for OT Re-Evaluation 04/25/17   Authorization Type [employer] 20 VISIT limit - no authorization required   Authorization - Visit Number 6   Authorization - Number of Visits 20   OT Start Time 0850   OT Stop Time 0930   OT Time Calculation (min) 40 min   Activity Tolerance Patient tolerated treatment well   Behavior During Therapy [facility] for tasks assessed/performed      Past Medical History:  Diagnosis Date  . Anxiety   . Arthritis    back  . Chronic cystitis   . Chronic low back pain   . Depression   . Diverticulosis of colon   . Hemorrhoids   . History of colon polyps    09-24-2009  rectal hyperplastic polyp/   and 2016 non-cancerous  . History of endometriosis   . History of panic attacks   . Hyperlipidemia   . Osteopenia   . Pelvic pain   . Sensation of pressure in bladder area   . Wears glasses     Past Surgical History:  Procedure Laterality Date  . BUNIONECTOMY Left 2004 approx  . CARPAL TUNNEL RELEASE Bilateral right 2014;  left 2007  . COLONOSCOPY W/ POLYPECTOMY  09/24/2009  . CYSTO WITH HYDRODISTENSION [name] 10/15/2016   Procedure: CYSTOSCOPY/HYDRODISTENSION AND MARCAINE INSTILLATION;  Surgeon: Rana Snare, MD;  Location: [facility];  Service: Urology;  Laterality: [name];  . CYSTO/  HYDRODISTENTION/  INSTILLATION THERAPY  1997 approx  . HYSTEROSCOPY W/D&C  11/01/2004   POLYPECTOMY  . IR GENERIC HISTORICAL  01/26/2017   IR GASTROSTOMY TUBE MOD SED 01/26/2017 Sandi Mariscal, MD [facility]  . LAPAROSCOPIC VAGINAL HYSTERECTOMY WITH SALPINGO  OOPHORECTOMY Bilateral 04/20/2012  . TUBAL LIGATION  yrs ago    There were no vitals filed for this visit.      Subjective Assessment - 03/06/17 1242    Subjective  Denies pain   Patient is accompained by: Family member   Currently in Pain? No/denies              Supine closed chain shoulder flexion and chest press  holding medium ball, 2 sets of 5 reps each, min mod facilitation. Quadraped overball lifting arms in alternately in low range, cat anc cow over ball, tall kneeling rolling ball forwards and backwards, min -mod facilitation for scapular stability/ trunk control. Standing for low- mid range functional reaching with LUE, min v.c facilitation, UE ranger for AAROM shoulder flexion, elbow ext for left then right UE's. Pt reported tightness in left shoulder neck, pt performed later neck flexion and rotation, therapist discussed importance of left UE positioning to minimize compensation.                  OT Short Term Goals - 02/25/17 1641      OT SHORT TERM GOAL #1   Title Patient will complete a home activity program daily with min prompting or set up    Time 4   Period Weeks   Status On-going     OT SHORT TERM GOAL #2  Title Patient will complete a home exercise program designed to improve range of motion in BUE's with no more than set up assistance   Time 4   Period Weeks   Status On-going     OT SHORT TERM GOAL #3   Title Patient will utilize bilateral upper extremities to cut a piece of meat during a meal with modified independence   Time 4   Period Weeks   Status On-going     OT SHORT TERM GOAL #4   Title Patient will don compression hose with increased time and without physical assistance   Period Weeks   Status On-going     OT SHORT TERM GOAL #5   Title Patient will demonstrate sufficient activity tolerance to complete light housekeeping task in standing for greater than 5 minutes.     Time 4   Period Weeks   Status On-going      Additional Short Term Goals   Additional Short Term Goals Yes     OT SHORT TERM GOAL #6   Title Patient will demonstrate effective mid level reach to retrieve lightweight item less than 2 lbs from chest height shelf in standing using BUE's   Time 4   Period Weeks   Status New           OT Long Term Goals - 02/25/17 1643      OT LONG TERM GOAL #1   Title Patient will complete an updated home exercise program designed for UE strengthening daily   Time 8   Period Weeks   Status On-going     OT LONG TERM GOAL #2   Title Patient will prepare simple hot meal for she and her husband with no rest breaks- 30 min of activity   Time 8   Period Weeks   Status On-going     OT LONG TERM GOAL #3   Title Patient will complete laundry task- including transporting items to washer, and transferring to dryer, folding and putting away clothing with intermittent assistance only   Time 8   Period Weeks   Status On-going     OT LONG TERM GOAL #4   Title Patient will utilize bilateral upper extremities to blow dry and style her hair with modified independence   Time 8   Period Weeks   Status On-going     OT LONG TERM GOAL #5   Title Patient will demonstrate sufficient UE/LE strength and balance to reach to floor to retrieve a lightweight (no more than 2 lb) object without loss of balance   Time 8   Period Weeks   Status On-going     Long Term Additional Goals   Additional Long Term Goals Yes     OT LONG TERM GOAL #6   Title Patient will demonstrate sufficient strength for unilateral reach to retrieve a 3 lb object from overhead shelf while standing with right hand and left hand (not bilateral reach)     OT LONG TERM GOAL #7   Title Patient will demonstrate sufficient strength to get down to floor to clean tub, and get back up without assistance   Time 8   Period Weeks   Status New               Plan - 03/06/17 1243    Clinical Impression Statement Pt is progressing towards  goals for UE strength and functional use.   Rehab Potential Good   OT Frequency 3x / week   OT  Duration 4 weeks  followed by 2x week x 4 weeks   OT Treatment/Interventions Self-care/ADL training;Electrical Stimulation;Therapeutic exercise;Neuromuscular education;Energy conservation;Manual Therapy;Functional Mobility Training;DME and/or AE instruction;Visual/perceptual remediation/compensation;Cognitive remediation/compensation;Therapeutic activities;Balance training;Patient/family education   Plan continue to work towards short term goals, scapular stabilization/ UE functional use   OT Home Exercise Plan Initiated HEP shoulders    Consulted and Agree with Plan of Care Patient;Family member/caregiver      Patient will benefit from skilled therapeutic intervention in order to improve the following deficits and impairments:  Decreased coordination, Decreased range of motion, Decreased endurance, Decreased safety awareness, Decreased balance, Decreased cognition, Decreased strength, Impaired perceived functional ability, Impaired vision/preception, Impaired UE functional use, Decreased knowledge of use of DME, Impaired tone, Pain, Impaired sensation, Improper body mechanics  Visit Diagnosis: Muscle weakness (generalized)  Unsteadiness on feet  Other abnormalities of gait and mobility  Acute pain of left shoulder    Problem List Patient Active Problem List   Diagnosis Date Noted  . Acute blood loss anemia   . Physical deconditioning   . Tracheostomy status ([place])   . Hypoxemia   . Dysphagia   . Numbness and tingling   . S/P percutaneous endoscopic gastrostomy (PEG) tube placement ([place])   . Bradycardia   . Left corneal abrasion   . Abnormal chest x-ray   . Acute respiratory failure with hypoxia ([place])   . Guillain Barr syndrome ([place])   . Atrial fibrillation with RVR ([place])   . Dysphagia, pharyngoesophageal phase   . History of ETT   . Encounter for central line placement   .  Encounter for feeding tube placement   . Weakness of right lower extremity   . SOB (shortness of breath)   . Pneumonia   . Pressure injury of skin 01/16/2017  . Acute respiratory failure ([place])   . Hyponatremia with decreased serum osmolality   . Hyponatremia   . Quadriplegia and quadriparesis ([place]) 01/14/2017  . GBS (Guillain Barre syndrome) ([place]) 01/13/2017  . Weakness 01/12/2017  . Hypokalemia 01/12/2017  . Endometriosis 12/05/2013  . Chronic low back pain 08/19/2012  . HYPERLIPIDEMIA 12/10/2010  . COLONIC POLYPS, HX OF 12/13/2009  . HYPERGLYCEMIA, FASTING 11/22/2008  . Depression 02/07/2008  . ANOSMIA 02/07/2008  . HEADACHE, CHRONIC 02/07/2008  . MITRAL VALVE PROLAPSE 10/05/2007  . OSTEOPENIA 10/05/2007    Shahidah Nesbitt 03/06/2017, 12:45 PM  [facility] [address] [place], [place], [address] Phone: [phone]   Fax:  [phone]  Name: Marlyn Tondreau MRN: [id number] Date of Birth: [birth year]

## 2017-03-06 NOTE — Therapy (Signed)
[facility] [address] [place], [place], [address] Phone: [phone]   Fax:  [phone]  Physical Therapy Treatment  Patient Details  Name: Joanne Morrison MRN: [id number] Date of Birth: 09/19/1951 Referring Provider: Dr. Alger Simons  Encounter Date: 03/06/2017      PT End of Session - 03/06/17 0953    Visit Number 4   Number of Visits 16   Date for PT Re-Evaluation 04/25/17   Authorization Type [employer]   Authorization - Visit Number 4   Authorization - Number of Visits 20   PT Start Time 0802   PT Stop Time 0843   PT Time Calculation (min) 41 min   Equipment Utilized During Treatment Gait belt   Activity Tolerance Patient limited by fatigue  seated rest breaks x 2      Past Medical History:  Diagnosis Date  . Anxiety   . Arthritis    back  . Chronic cystitis   . Chronic low back pain   . Depression   . Diverticulosis of colon   . Hemorrhoids   . History of colon polyps    09-24-2009  rectal hyperplastic polyp/   and 2016 non-cancerous  . History of endometriosis   . History of panic attacks   . Hyperlipidemia   . Osteopenia   . Pelvic pain   . Sensation of pressure in bladder area   . Wears glasses     Past Surgical History:  Procedure Laterality Date  . BUNIONECTOMY Left 2004 approx  . CARPAL TUNNEL RELEASE Bilateral right 2014;  left 2007  . COLONOSCOPY W/ POLYPECTOMY  09/24/2009  . CYSTO WITH HYDRODISTENSION [name] 10/15/2016   Procedure: CYSTOSCOPY/HYDRODISTENSION AND MARCAINE INSTILLATION;  Surgeon: Rana Snare, MD;  Location: [facility];  Service: Urology;  Laterality: [name];  . CYSTO/  HYDRODISTENTION/  INSTILLATION THERAPY  1997 approx  . HYSTEROSCOPY W/D&C  11/01/2004   POLYPECTOMY  . IR GENERIC HISTORICAL  01/26/2017   IR GASTROSTOMY TUBE MOD SED 01/26/2017 Sandi Mariscal, MD [facility]  . LAPAROSCOPIC VAGINAL HYSTERECTOMY WITH SALPINGO OOPHORECTOMY Bilateral 04/20/2012   . TUBAL LIGATION  yrs ago    There were no vitals filed for this visit.      Subjective Assessment - 03/06/17 0806    Subjective Reports they have done exercises when they can squeeze them in. Doing more housework as exercise.    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 No/denies                         [facility] Adult PT Treatment/Exercise - 03/06/17 0813      Transfers   Transfers Sit to Stand;Stand to Sit   Sit to Stand 4: Min guard  no device   Stand to Sit 4: Min guard   Comments no UE support     Ambulation/Gait   Ambulation/Gait Yes   Ambulation/Gait Assistance 4: Min guard   Ambulation Distance (Feet) 330 Feet   Assistive device None   Gait Pattern Step-through pattern;Decreased arm swing - right;Decreased arm swing - left   Ambulation Surface Level;Indoor         Lumbar Exercises: Sidelying  Hip Abduction 10 reps;5 seconds  RLE and LLE; hooklying green band; move one leg     Knee/Hip Exercises: Stretches   Active Hamstring Stretch Both;1 rep;30 seconds  Runner's stretch     Knee/Hip Exercises: Aerobic   Nustep Level 4 x 3" - UE's and LE's, Steps/min >70                 Balance Exercises - 03/06/17 0836      Balance Exercises: Standing   SLS Eyes open;3 reps;10 secs   Retro Gait Foam/compliant surface;2 reps  red mat, 12' x 2   Sidestepping Foam/compliant support;4 reps  red mat 12'   Marching Limitations 12' red mat fwd and bckwad x 2    Other Standing Exercises minguard-min assist for all activities on compliant surface with unsteadiness throughout           PT Education - 03/06/17 0952     Education provided Yes   Education Details Reviewed HEP given last visit; pt required max cues for each exercise   Person(s) Educated Patient;Spouse   Methods Explanation;Demonstration;Verbal cues   Comprehension Verbalized understanding;Returned demonstration;Need further instruction          PT Short Term Goals - 02/24/17 1519      PT SHORT TERM GOAL #1   Title Pt will ambulate household distances modified independently without use of RW.  03-27-17   Time 4   Period Weeks   Status New     PT SHORT TERM GOAL #2   Title Amb. 500' with RW with SBA for increased community accessibility.  03-27-17   Time 4   Period Weeks   Status New     PT SHORT TERM GOAL #3   Title Improve TUG score to </= 13.5 secs for decreased fall risk.  03-27-17   Baseline 15.22 secs without RW   Time 4   Period Weeks   Status New     PT SHORT TERM GOAL #4   Title Increase gait velocity to >/= 2.6 ft/sec without device for incr. gait efficiency.  03-27-17   Time 4   Period Weeks   Status New     PT SHORT TERM GOAL #5   Title Increase Berg score by at least 5 points to reduce fall risk.  03-27-17   Baseline TBA next session   Time 4   Period Weeks   Status New     Additional Short Term Goals   Additional Short Term Goals Yes     PT SHORT TERM GOAL #6   Title Independent in HEP for lower extremity strengthening and balance exercises.  03-27-17   Time 4   Period Weeks   Status New           PT Long Term Goals - 02/24/17 1524      PT LONG TERM GOAL #1   Title Modified independent ambulation 1000' on even surfaces without device.  04-26-17   Time 8   Period Weeks   Status New     PT LONG TERM GOAL #2   Title Increase Berg score by at least 10 points for improved balance and for reduced fall risk.  04-26-17   Time 8   Period Weeks   Status New     PT LONG TERM GOAL #3   Title Improve TUG score to </= 11.5 secs without device to demo improved functional mobility.  04-26-17   Baseline  17.38 secs with RW:  15.22 secs without  RW   Time 8   Period Weeks   Status New     PT LONG TERM GOAL #4   Title Negotiate steps using step over step sequence without rail modified independently.  04-26-17   Baseline SBA with use of 2 rails step by step   Time 8   Period Weeks   Status New     PT LONG TERM GOAL #5   Title Independent in updated HEP for strengthening and balance.  04-26-17   Time 8   Period Weeks   Status New               Plan - 03/06/17 0954    Clinical Impression Statement Patient eager to get active and at times impulsive (starting to move before PT even finished instructions). Poor recall of HEP exercises given last visit. Session focused on standing balance and gait. Pt progressing towards goals.    Rehab Potential Good   PT Frequency 2x / week   PT Duration 8 weeks   PT Treatment/Interventions ADLs/Self Care Home Management;Gait training;Stair training;Functional mobility training;Therapeutic activities;Therapeutic exercise;Balance training;Neuromuscular re-education;Patient/family education   PT Next Visit Plan check 2 exs again (SLS & hip abdct with green band) added to HEP; try hip ext. with red band in standing (?add to HEP); cont LE strengthening;    PT Home Exercise Plan see above - plan to add theraband for LE strengthening;  add hip extension in standing to HEP   Consulted and Agree with Plan of Care Patient;Family member/caregiver   Family Member Consulted spouse [place]      Patient will benefit from skilled therapeutic intervention in order to improve the following deficits and impairments:  Abnormal gait, Decreased endurance, Decreased activity tolerance, Decreased balance, Decreased mobility, Decreased strength, Impaired vision/preception, Pain, Impaired sensation  Visit Diagnosis: Muscle weakness (generalized)  Unsteadiness on feet  Other abnormalities of gait and mobility     Problem List Patient Active Problem List   Diagnosis  Date Noted  . Acute blood loss anemia   . Physical deconditioning   . Tracheostomy status ([place])   . Hypoxemia   . Dysphagia   . Numbness and tingling   . S/P percutaneous endoscopic gastrostomy (PEG) tube placement ([place])   . Bradycardia   . Left corneal abrasion   . Abnormal chest x-ray   . Acute respiratory failure with hypoxia ([place])   . Guillain Barr syndrome ([place])   . Atrial fibrillation with RVR ([place])   . Dysphagia, pharyngoesophageal phase   . History of ETT   . Encounter for central line placement   . Encounter for feeding tube placement   . Weakness of right lower extremity   . SOB (shortness of breath)   . Pneumonia   . Pressure injury of skin 01/16/2017  . Acute respiratory failure ([place])   . Hyponatremia with decreased serum osmolality   . Hyponatremia   . Quadriplegia and quadriparesis ([place]) 01/14/2017  . GBS (Guillain Barre syndrome) ([place]) 01/13/2017  . Weakness 01/12/2017  . Hypokalemia 01/12/2017  . Endometriosis 12/05/2013  . Chronic low back pain 08/19/2012  . HYPERLIPIDEMIA 12/10/2010  . COLONIC POLYPS, HX OF 12/13/2009  . HYPERGLYCEMIA, FASTING 11/22/2008  . Depression 02/07/2008  . ANOSMIA 02/07/2008  . HEADACHE, CHRONIC 02/07/2008  . MITRAL VALVE PROLAPSE 10/05/2007  . OSTEOPENIA 10/05/2007    Rexanne Mano, PT 03/06/2017, 9:59 AM  [facility] [address] [place], [place], [address] Phone: [phone]  Fax:  [phone]  Name: Brunetta Newingham MRN: [id number] Date of Birth: [birth year]

## 2017-03-09 ENCOUNTER — Ambulatory Visit: Payer: BLUE CROSS/BLUE SHIELD | Admitting: Speech Pathology

## 2017-03-09 ENCOUNTER — Ambulatory Visit: Payer: BLUE CROSS/BLUE SHIELD | Admitting: Occupational Therapy

## 2017-03-09 ENCOUNTER — Ambulatory Visit: Payer: BLUE CROSS/BLUE SHIELD | Admitting: Physical Therapy

## 2017-03-09 DIAGNOSIS — R2681 Unsteadiness on feet: Secondary | ICD-10-CM

## 2017-03-09 DIAGNOSIS — R278 Other lack of coordination: Secondary | ICD-10-CM

## 2017-03-09 DIAGNOSIS — R41841 Cognitive communication deficit: Secondary | ICD-10-CM | POA: Diagnosis not present

## 2017-03-09 DIAGNOSIS — M6281 Muscle weakness (generalized): Secondary | ICD-10-CM

## 2017-03-09 DIAGNOSIS — R208 Other disturbances of skin sensation: Secondary | ICD-10-CM

## 2017-03-09 DIAGNOSIS — R2689 Other abnormalities of gait and mobility: Secondary | ICD-10-CM

## 2017-03-09 DIAGNOSIS — M25512 Pain in left shoulder: Secondary | ICD-10-CM

## 2017-03-09 NOTE — Therapy (Signed)
[facility] [address] [place], [place], [address] Phone: [phone]   Fax:  [phone]  Speech Language Pathology Treatment  Patient Details  Name: Joanne Morrison MRN: [id number] Date of Birth: February 06, 1951 Referring Provider: Dr. Alger Simons  Encounter Date: 03/09/2017      End of Session - 03/09/17 1141    Visit Number 2   Number of Visits 17   Date for SLP Re-Evaluation 04/24/17   SLP Start Time 0935   SLP Stop Time  1015   SLP Time Calculation (min) 40 min   Activity Tolerance Patient limited by fatigue      Past Medical History:  Diagnosis Date  . Anxiety   . Arthritis    back  . Chronic cystitis   . Chronic low back pain   . Depression   . Diverticulosis of colon   . Hemorrhoids   . History of colon polyps    09-24-2009  rectal hyperplastic polyp/   and 2016 non-cancerous  . History of endometriosis   . History of panic attacks   . Hyperlipidemia   . Osteopenia   . Pelvic pain   . Sensation of pressure in bladder area   . Wears glasses     Past Surgical History:  Procedure Laterality Date  . BUNIONECTOMY Left 2004 approx  . CARPAL TUNNEL RELEASE Bilateral right 2014;  left 2007  . COLONOSCOPY W/ POLYPECTOMY  09/24/2009  . CYSTO WITH HYDRODISTENSION [name] 10/15/2016   Procedure: CYSTOSCOPY/HYDRODISTENSION AND MARCAINE INSTILLATION;  Surgeon: Rana Snare, MD;  Location: [facility];  Service: Urology;  Laterality: [name];  . CYSTO/  HYDRODISTENTION/  INSTILLATION THERAPY  1997 approx  . HYSTEROSCOPY W/D&C  11/01/2004   POLYPECTOMY  . IR GENERIC HISTORICAL  01/26/2017   IR GASTROSTOMY TUBE MOD SED 01/26/2017 Sandi Mariscal, MD [facility]  . LAPAROSCOPIC VAGINAL HYSTERECTOMY WITH SALPINGO OOPHORECTOMY Bilateral 04/20/2012  . TUBAL LIGATION  yrs ago    There were no vitals filed for this visit.      Subjective Assessment - 03/09/17 0938    Subjective "When I try to remember  something it comes and goes"   Patient is accompained by: Family member   Currently in Pain? No/denies               ADULT SLP TREATMENT - 03/09/17 0939      General Information   Behavior/Cognition Alert;Cooperative   Patient Positioning Upright in chair   Oral care provided [name]     Treatment Provided   Treatment provided Cognitive-Linquistic     Pain Assessment   Pain Assessment No/denies pain     Cognitive-Linquistic Treatment   Treatment focused on Cognition   Skilled Treatment SLP provided education to patient and her husband regarding results of previous session's evaluation, course of treatment, and discussed goals of care. SLP facilitated awareness of strategies pt uses currently to improve function with planning, attention and memory. Pt describes to-do list and calendar system she has been using for years, verbalizes several strategies including writing down information at the time it occurs, writing to do lists, keeping information in one place, referring to list frequently. She states she has been continuing to use this system for managing daily tasks at home. Engaged pt in functional online bill-paying task targeting focused, sustained and alternating attention. Pt followed steps with 80% accuracy, requiring occasional min cues to read all information on the page, check her work for accuracy. Provided education about functional  implications of attention deficits, importance of self-checking to ensure accuracy. Assigned math word problems for home exercise.     Assessment / Recommendations / Plan   Plan Continue with current plan of care     Progression Toward Goals   Progression toward goals Progressing toward goals          SLP Education - 03/09/17 1141    Education provided Yes   Education Details deficit areas, strategies for memory and attention   Person(s) Educated Patient;Spouse   Methods Explanation;Demonstration;Handout;Verbal cues   Comprehension  Verbalized understanding;Returned demonstration          SLP Short Term Goals - 03/09/17 1142      SLP SHORT TERM GOAL #1   Title Pt will perform HEP for facial weakness with mod I   Time 4   Period Weeks   Status On-going     SLP SHORT TERM GOAL #2   Title Pt will solve moderately complex reasoning, organizing, and functional math problems with rare min A and 85% accuracy   Time 4   Period Weeks   Status On-going     SLP SHORT TERM GOAL #3   Title Pt will alternate attention between 2 simple cognitive lingusitic tasks with 85% on each and occasional min A   Time 4   Period Weeks   Status On-going          SLP Long Term Goals - 03/09/17 1142      SLP LONG TERM GOAL #1   Title Pt will divide attention between 2 simple cognitive linguistic tasks with  90% on each and rare min A   Time 8   Period Weeks   Status On-going     SLP LONG TERM GOAL #2   Title Pt will verbalize 3 compensations for attention with mod I   Time 8   Period Weeks   Status On-going          Plan - 03/09/17 1142    Clinical Impression Statement Pt presents with high level cognitive deficits including abstract reasoning, attention to details and memory. She appears motivated to address these deficits and demonstrates understanding of benefits of external aids to improve functioning. Recommend skilled ST to maximize high level cognition, train HEP for facial weakness for improved QOL and independence.    Speech Therapy Frequency 2x / week   Treatment/Interventions Oral motor exercises;Compensatory strategies;Functional tasks;Patient/family education;Cognitive reorganization;SLP instruction and feedback;Internal/external aids   Potential to Achieve Goals Good   Potential Considerations Ability to learn/carryover information   SLP Home Exercise Plan math word problems   Consulted and Agree with Plan of Care Patient;Family member/caregiver   Family Member Consulted spouse      Patient will  benefit from skilled therapeutic intervention in order to improve the following deficits and impairments:   Cognitive communication deficit    Problem List Patient Active Problem List   Diagnosis Date Noted  . Acute blood loss anemia   . Physical deconditioning   . Tracheostomy status ([place])   . Hypoxemia   . Dysphagia   . Numbness and tingling   . S/P percutaneous endoscopic gastrostomy (PEG) tube placement ([place])   . Bradycardia   . Left corneal abrasion   . Abnormal chest x-ray   . Acute respiratory failure with hypoxia ([place])   . Guillain Barr syndrome ([place])   . Atrial fibrillation with RVR ([place])   . Dysphagia, pharyngoesophageal phase   . History of ETT   . Encounter  for central line placement   . Encounter for feeding tube placement   . Weakness of right lower extremity   . SOB (shortness of breath)   . Pneumonia   . Pressure injury of skin 01/16/2017  . Acute respiratory failure ([place])   . Hyponatremia with decreased serum osmolality   . Hyponatremia   . Quadriplegia and quadriparesis ([place]) 01/14/2017  . GBS (Guillain Barre syndrome) ([place]) 01/13/2017  . Weakness 01/12/2017  . Hypokalemia 01/12/2017  . Endometriosis 12/05/2013  . Chronic low back pain 08/19/2012  . HYPERLIPIDEMIA 12/10/2010  . COLONIC POLYPS, HX OF 12/13/2009  . HYPERGLYCEMIA, FASTING 11/22/2008  . Depression 02/07/2008  . ANOSMIA 02/07/2008  . HEADACHE, CHRONIC 02/07/2008  . MITRAL VALVE PROLAPSE 10/05/2007  . OSTEOPENIA 10/05/2007   Deneise Lever, [place] CF-SLP Speech-Language Pathologist  Aliene Altes 03/09/2017, 11:46 AM  [facility] [address] [place], [place], [address] Phone: [phone]   Fax:  [phone]   Name: Joanne Morrison MRN: [id number] Date of Birth: 06-26-1951

## 2017-03-09 NOTE — Therapy (Signed)
[facility] [address] [place], [place], [address] Phone: [phone]   Fax:  [phone]  Occupational Therapy Treatment  Patient Details  Name: Joanne Morrison MRN: [id number] Date of Birth: [birth year] Referring Provider: Dr Naaman Plummer  Encounter Date: 03/09/2017      OT End of Session - 03/09/17 1509    Visit Number 7   Number of Visits 20   Date for OT Re-Evaluation 04/25/17   Authorization Type [employer] 20 VISIT limit - no authorization required   Authorization - Visit Number 7   Authorization - Number of Visits 20   OT Start Time 0850   OT Stop Time 0930   OT Time Calculation (min) 40 min   Activity Tolerance Patient tolerated treatment well   Behavior During Therapy [facility] for tasks assessed/performed      Past Medical History:  Diagnosis Date  . Anxiety   . Arthritis    back  . Chronic cystitis   . Chronic low back pain   . Depression   . Diverticulosis of colon   . Hemorrhoids   . History of colon polyps    09-24-2009  rectal hyperplastic polyp/   and 2016 non-cancerous  . History of endometriosis   . History of panic attacks   . Hyperlipidemia   . Osteopenia   . Pelvic pain   . Sensation of pressure in bladder area   . Wears glasses     Past Surgical History:  Procedure Laterality Date  . BUNIONECTOMY Left 2004 approx  . CARPAL TUNNEL RELEASE Bilateral right 2014;  left 2007  . COLONOSCOPY W/ POLYPECTOMY  09/24/2009  . CYSTO WITH HYDRODISTENSION [name] 10/15/2016   Procedure: CYSTOSCOPY/HYDRODISTENSION AND MARCAINE INSTILLATION;  Surgeon: Rana Snare, MD;  Location: [facility];  Service: Urology;  Laterality: [name];  . CYSTO/  HYDRODISTENTION/  INSTILLATION THERAPY  1997 approx  . HYSTEROSCOPY W/D&C  11/01/2004   POLYPECTOMY  . IR GENERIC HISTORICAL  01/26/2017   IR GASTROSTOMY TUBE MOD SED 01/26/2017 Sandi Mariscal, MD [facility]  . LAPAROSCOPIC VAGINAL HYSTERECTOMY WITH SALPINGO  OOPHORECTOMY Bilateral 04/20/2012  . TUBAL LIGATION  yrs ago    There were no vitals filed for this visit.      Subjective Assessment - 03/09/17 0850    Subjective  Denies pain   Patient is accompained by: Family member   Currently in Pain? No/denies           Treatment: closed chain shoulder flexion and chest press win supine with ball, min v.c/ facilitation. Reviewed supine external rotation exercise with pt/ husband, pt's husband returned demonstration. Seated AA/ROM shoulder flexion to left then right UE's with UE ranger, pt required mod facilitation/ v.c. For LUE, min v.c. For RUE. Quadraped cat/ cow over ball then tall kneeling to roll ball forwards/ backwards for trunk control/ UE strength, min facilitation. Standing to place washers on various height targets with left and right UE's min v.c to avoid compensation.                      OT Short Term Goals - 02/25/17 1641      OT SHORT TERM GOAL #1   Title Patient will complete a home activity program daily with min prompting or set up    Time 4   Period Weeks   Status On-going     OT SHORT TERM GOAL #2   Title Patient will complete a home exercise program designed to  improve range of motion in BUE's with no more than set up assistance   Time 4   Period Weeks   Status On-going     OT SHORT TERM GOAL #3   Title Patient will utilize bilateral upper extremities to cut a piece of meat during a meal with modified independence   Time 4   Period Weeks   Status On-going     OT SHORT TERM GOAL #4   Title Patient will don compression hose with increased time and without physical assistance   Period Weeks   Status On-going     OT SHORT TERM GOAL #5   Title Patient will demonstrate sufficient activity tolerance to complete light housekeeping task in standing for greater than 5 minutes.     Time 4   Period Weeks   Status On-going     Additional Short Term Goals   Additional Short Term Goals Yes      OT SHORT TERM GOAL #6   Title Patient will demonstrate effective mid level reach to retrieve lightweight item less than 2 lbs from chest height shelf in standing using BUE's   Time 4   Period Weeks   Status New           OT Long Term Goals - 02/25/17 1643      OT LONG TERM GOAL #1   Title Patient will complete an updated home exercise program designed for UE strengthening daily   Time 8   Period Weeks   Status On-going     OT LONG TERM GOAL #2   Title Patient will prepare simple hot meal for she and her husband with no rest breaks- 30 min of activity   Time 8   Period Weeks   Status On-going     OT LONG TERM GOAL #3   Title Patient will complete laundry task- including transporting items to washer, and transferring to dryer, folding and putting away clothing with intermittent assistance only   Time 8   Period Weeks   Status On-going     OT LONG TERM GOAL #4   Title Patient will utilize bilateral upper extremities to blow dry and style her hair with modified independence   Time 8   Period Weeks   Status On-going     OT LONG TERM GOAL #5   Title Patient will demonstrate sufficient UE/LE strength and balance to reach to floor to retrieve a lightweight (no more than 2 lb) object without loss of balance   Time 8   Period Weeks   Status On-going     Long Term Additional Goals   Additional Long Term Goals Yes     OT LONG TERM GOAL #6   Title Patient will demonstrate sufficient strength for unilateral reach to retrieve a 3 lb object from overhead shelf while standing with right hand and left hand (not bilateral reach)     OT LONG TERM GOAL #7   Title Patient will demonstrate sufficient strength to get down to floor to clean tub, and get back up without assistance   Time 8   Period Weeks   Status New               Plan - 03/09/17 1507    Clinical Impression Statement Pt is progressing towards goals for UE functional use.   Rehab Potential Good   OT Frequency  3x / week   OT Duration 4 weeks  then 2x week x 4 weeks  OT Treatment/Interventions Self-care/ADL training;Electrical Stimulation;Therapeutic exercise;Neuromuscular education;Energy conservation;Manual Therapy;Functional Mobility Training;DME and/or AE instruction;Visual/perceptual remediation/compensation;Cognitive remediation/compensation;Therapeutic activities;Balance training;Patient/family education   Plan practice cutting food, donn compression hose   OT Home Exercise Plan Initiated HEP shoulders    Consulted and Agree with Plan of Care Patient;Family member/caregiver      Patient will benefit from skilled therapeutic intervention in order to improve the following deficits and impairments:  Decreased coordination, Decreased range of motion, Decreased endurance, Decreased safety awareness, Decreased balance, Decreased cognition, Decreased strength, Impaired perceived functional ability, Impaired vision/preception, Impaired UE functional use, Decreased knowledge of use of DME, Impaired tone, Pain, Impaired sensation, Improper body mechanics  Visit Diagnosis: Muscle weakness (generalized)  Unsteadiness on feet  Other abnormalities of gait and mobility  Acute pain of left shoulder  Other disturbances of skin sensation  Other lack of coordination    Problem List Patient Active Problem List   Diagnosis Date Noted  . Acute blood loss anemia   . Physical deconditioning   . Tracheostomy status ([place])   . Hypoxemia   . Dysphagia   . Numbness and tingling   . S/P percutaneous endoscopic gastrostomy (PEG) tube placement ([place])   . Bradycardia   . Left corneal abrasion   . Abnormal chest x-ray   . Acute respiratory failure with hypoxia ([place])   . Guillain Barr syndrome ([place])   . Atrial fibrillation with RVR ([place])   . Dysphagia, pharyngoesophageal phase   . History of ETT   . Encounter for central line placement   . Encounter for feeding tube placement   . Weakness of right  lower extremity   . SOB (shortness of breath)   . Pneumonia   . Pressure injury of skin 01/16/2017  . Acute respiratory failure ([place])   . Hyponatremia with decreased serum osmolality   . Hyponatremia   . Quadriplegia and quadriparesis ([place]) 01/14/2017  . GBS (Guillain Barre syndrome) ([place]) 01/13/2017  . Weakness 01/12/2017  . Hypokalemia 01/12/2017  . Endometriosis 12/05/2013  . Chronic low back pain 08/19/2012  . HYPERLIPIDEMIA 12/10/2010  . COLONIC POLYPS, HX OF 12/13/2009  . HYPERGLYCEMIA, FASTING 11/22/2008  . Depression 02/07/2008  . ANOSMIA 02/07/2008  . HEADACHE, CHRONIC 02/07/2008  . MITRAL VALVE PROLAPSE 10/05/2007  . OSTEOPENIA 10/05/2007    Eugune Sine 03/09/2017, 3:10 PM  [facility] [address] [place], [place], [address] Phone: [phone]   Fax:  [phone]  Name: Blue Ruggerio MRN: [id number] Date of Birth: [birth year]

## 2017-03-10 ENCOUNTER — Ambulatory Visit: Payer: BLUE CROSS/BLUE SHIELD | Admitting: Occupational Therapy

## 2017-03-10 ENCOUNTER — Telehealth: Payer: Self-pay

## 2017-03-10 ENCOUNTER — Ambulatory Visit: Payer: BLUE CROSS/BLUE SHIELD | Admitting: Physical Therapy

## 2017-03-10 DIAGNOSIS — M6281 Muscle weakness (generalized): Secondary | ICD-10-CM

## 2017-03-10 DIAGNOSIS — R2681 Unsteadiness on feet: Secondary | ICD-10-CM

## 2017-03-10 DIAGNOSIS — R2689 Other abnormalities of gait and mobility: Secondary | ICD-10-CM

## 2017-03-10 DIAGNOSIS — R278 Other lack of coordination: Secondary | ICD-10-CM

## 2017-03-10 DIAGNOSIS — R41841 Cognitive communication deficit: Secondary | ICD-10-CM | POA: Diagnosis not present

## 2017-03-10 DIAGNOSIS — R208 Other disturbances of skin sensation: Secondary | ICD-10-CM

## 2017-03-10 NOTE — Patient Instructions (Signed)
Strengthening: Hip Extension - Resisted    With tubing around right ankle, face anchor and pull leg straight back. Repeat _10___ times per set. Do _3___ sets per session. Do __1-2__ sessions per day.  http://orth.exer.us/637   Copyright  VHI. All rights reserved.

## 2017-03-10 NOTE — Therapy (Signed)
[facility] [address] [place], [place], [address] Phone: [phone]   Fax:  [phone]  Physical Therapy Treatment  Patient Details  Name: Joanne Morrison MRN: [id number] Date of Birth: 09-20-1951 Referring Provider: Dr. Alger Simons  Encounter Date: 03/10/2017      PT End of Session - 03/10/17 1654    Visit Number 5   Number of Visits 16   Date for PT Re-Evaluation 04/25/17   Authorization Type BCBS   Authorization - Visit Number 5   Authorization - Number of Visits 20   PT Start Time 0933   PT Stop Time 1016   PT Time Calculation (min) 43 min      Past Medical History:  Diagnosis Date  . Anxiety   . Arthritis    back  . Chronic cystitis   . Chronic low back pain   . Depression   . Diverticulosis of colon   . Hemorrhoids   . History of colon polyps    09-24-2009  rectal hyperplastic polyp/   and 2016 non-cancerous  . History of endometriosis   . History of panic attacks   . Hyperlipidemia   . Osteopenia   . Pelvic pain   . Sensation of pressure in bladder area   . Wears glasses     Past Surgical History:  Procedure Laterality Date  . BUNIONECTOMY Left 2004 approx  . CARPAL TUNNEL RELEASE Bilateral right 2014;  left 2007  . COLONOSCOPY W/ POLYPECTOMY  09/24/2009  . CYSTO WITH HYDRODISTENSION [name] 10/15/2016   Procedure: CYSTOSCOPY/HYDRODISTENSION AND MARCAINE INSTILLATION;  Surgeon: Rana Snare, MD;  Location: [facility];  Service: Urology;  Laterality: [name];  . CYSTO/  HYDRODISTENTION/  INSTILLATION THERAPY  1997 approx  . HYSTEROSCOPY W/D&C  11/01/2004   POLYPECTOMY  . IR GENERIC HISTORICAL  01/26/2017   IR GASTROSTOMY TUBE MOD SED 01/26/2017 Sandi Mariscal, MD [facility]  . LAPAROSCOPIC VAGINAL HYSTERECTOMY WITH SALPINGO OOPHORECTOMY Bilateral 04/20/2012  . TUBAL LIGATION  yrs ago    There were no vitals filed for this visit.      Subjective Assessment - 03/10/17 1638     Subjective Pt reports MD put a weight on her eye to help hold lid down; is doing better with walking at home. 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 No/denies                         [facility] Adult PT Treatment/Exercise - 03/10/17 0958      Transfers   Transfers Sit to Stand;Stand to Sit   Sit to Stand 4: Min guard   Stand to Sit 5: Supervision   Comments no UE support - performed on blue AIrEx     Ambulation/Gait   Ambulation/Gait Yes   Ambulation/Gait Assistance 4: Min guard   Ambulation Distance (Feet) 100 Feet   Assistive device None   Gait Pattern Step-through pattern;Decreased arm swing - right;Decreased arm swing - left   Ambulation Surface Level;Indoor     Lumbar Exercises: Machines for Strengthening  Leg Press 50# 10 reps. 60# 2 sets 10 reps     Knee/Hip Exercises: Stretches   Active Hamstring Stretch Both;1 rep;30 seconds     Knee/Hip Exercises: Standing   Heel Raises Both;1 set;10 reps   Other Standing Knee Exercises Pt performed standing hip abduction, flexion and extension with red theraband x 10 reps each             Balance Exercises - 03/10/17 1653      Balance Exercises: Standing   Step Ups 6 inch;UE support 2  10 reps each   Sidestepping 2 reps     Crossovers front 10' x 2 reps inside bars with UE support prn  Pt amb. 10' x 2 reps inside // bars on tip toes with minimal UE support Amb. 10' x 2 reps backward on tiptoes inside // bars with UE support prn  Tall kneeling position on mat - UE support on green  physioball; lifting opposite UE for core stabilization x 2 reps each  Partial squats back onto heels x 10 reps with CGA for hip extensor strengthening        PT Short Term Goals - 02/24/17 1519      PT SHORT TERM GOAL #1   Title Pt will ambulate household distances modified independently without use of RW.  03-27-17   Time 4   Period Weeks   Status New     PT SHORT TERM GOAL #2   Title Amb. 500' with RW with SBA for increased community accessibility.  03-27-17   Time 4   Period Weeks   Status New     PT SHORT TERM GOAL #3   Title Improve TUG score to </= 13.5 secs for decreased fall risk.  03-27-17   Baseline 15.22 secs without RW   Time 4   Period Weeks   Status New     PT SHORT TERM GOAL #4   Title Increase gait velocity to >/= 2.6 ft/sec without device for incr. gait efficiency.  03-27-17   Time 4   Period Weeks   Status New     PT SHORT TERM GOAL #5   Title Increase Berg score by at least 5 points to reduce fall risk.  03-27-17   Baseline TBA next session   Time 4   Period Weeks   Status New     Additional Short Term Goals   Additional Short Term Goals Yes     PT SHORT TERM GOAL #6   Title Independent in HEP for lower extremity strengthening and balance exercises.  03-27-17   Time 4   Period Weeks   Status New           PT Long Term Goals - 02/24/17 1524      PT LONG TERM GOAL #1   Title Modified independent ambulation 1000' on even surfaces without device.  04-26-17   Time 8   Period Weeks   Status New     PT LONG TERM GOAL #2   Title Increase Berg score by at least 10 points for improved balance and for reduced fall risk.  04-26-17   Time 8   Period Weeks   Status New     PT LONG TERM GOAL #3   Title Improve TUG score to </= 11.5 secs without device to demo improved functional mobility.  04-26-17   Baseline 17.38 secs with RW:  15.22 secs without RW   Time 8   Period Weeks   Status  New     PT LONG TERM GOAL #4   Title Negotiate steps using  step over step sequence without rail modified independently.  04-26-17   Baseline SBA with use of 2 rails step by step   Time 8   Period Weeks   Status New     PT LONG TERM GOAL #5   Title Independent in updated HEP for strengthening and balance.  04-26-17   Time 8   Period Weeks   Status New               Plan - 03/10/17 1655    Clinical Impression Statement Pt demonstrating improvement in LE strength but continues to have tremors which increase with fatigue.  Pt demonstrates slightly decr. core stabilization as UE support needed for safety in tall kneeling position.   Rehab Potential Good   PT Frequency 2x / week   PT Duration 8 weeks   PT Treatment/Interventions ADLs/Self Care Home Management;Gait training;Stair training;Functional mobility training;Therapeutic activities;Therapeutic exercise;Balance training;Neuromuscular re-education;Patient/family education   PT Next Visit Plan cont. LE strengthening and balance/gait training   PT Home Exercise Plan see above - plan to add theraband for LE strengthening;  add hip extension in standing to HEP - added hip ext with red theraband on 03-10-17   Consulted and Agree with Plan of Care Patient;Family member/caregiver   Family Member Consulted spouse [place]      Patient will benefit from skilled therapeutic intervention in order to improve the following deficits and impairments:  Abnormal gait, Decreased endurance, Decreased activity tolerance, Decreased balance, Decreased mobility, Decreased strength, Impaired vision/preception, Pain, Impaired sensation  Visit Diagnosis: Muscle weakness (generalized)  Other abnormalities of gait and mobility     Problem List Patient Active Problem List   Diagnosis Date Noted  . Acute blood loss anemia   . Physical deconditioning   . Tracheostomy status ([place])   . Hypoxemia   . Dysphagia   . Numbness and tingling   . S/P percutaneous endoscopic gastrostomy (PEG) tube placement ([place])   .  Bradycardia   . Left corneal abrasion   . Abnormal chest x-ray   . Acute respiratory failure with hypoxia ([place])   . Guillain Barr syndrome ([place])   . Atrial fibrillation with RVR ([place])   . Dysphagia, pharyngoesophageal phase   . History of ETT   . Encounter for central line placement   . Encounter for feeding tube placement   . Weakness of right lower extremity   . SOB (shortness of breath)   . Pneumonia   . Pressure injury of skin 01/16/2017  . Acute respiratory failure ([place])   . Hyponatremia with decreased serum osmolality   . Hyponatremia   . Quadriplegia and quadriparesis ([place]) 01/14/2017  . GBS (Guillain Barre syndrome) ([place]) 01/13/2017  . Weakness 01/12/2017  . Hypokalemia 01/12/2017  . Endometriosis 12/05/2013  . Chronic low back pain 08/19/2012  . HYPERLIPIDEMIA 12/10/2010  . COLONIC POLYPS, HX OF 12/13/2009  . HYPERGLYCEMIA, FASTING 11/22/2008  . Depression 02/07/2008  . ANOSMIA 02/07/2008  . HEADACHE, CHRONIC 02/07/2008  . MITRAL VALVE PROLAPSE 10/05/2007  . OSTEOPENIA 10/05/2007    Alda Lea, PT 03/10/2017, 5:05 PM  [facility] [address] [place], [place], [address] Phone: [phone]   Fax:  [phone]  Name: Joanne Morrison MRN: [id number] Date of Birth: [birth year]

## 2017-03-10 NOTE — Therapy (Signed)
[place] [address] [place], [place], [address] Phone: [phone]   Fax:  [phone]  Occupational Therapy Treatment  Patient Details  Name: Joanne Morrison MRN: [id number] Date of Birth: [birth year] Referring Provider: Dr Naaman Plummer  Encounter Date: 03/10/2017      OT End of Session - 03/10/17 1610    Visit Number 8   Number of Visits 20   Date for OT Re-Evaluation 04/25/17   Authorization Type [employer] 20 VISIT limit - no authorization required   Authorization - Visit Number 8   Authorization - Number of Visits 20   OT Start Time 1020   OT Stop Time 1105   OT Time Calculation (min) 45 min   Activity Tolerance Patient tolerated treatment well   Behavior During Therapy [facility] for tasks assessed/performed      Past Medical History:  Diagnosis Date  . Anxiety   . Arthritis    back  . Chronic cystitis   . Chronic low back pain   . Depression   . Diverticulosis of colon   . Hemorrhoids   . History of colon polyps    09-24-2009  rectal hyperplastic polyp/   and 2016 non-cancerous  . History of endometriosis   . History of panic attacks   . Hyperlipidemia   . Osteopenia   . Pelvic pain   . Sensation of pressure in bladder area   . Wears glasses     Past Surgical History:  Procedure Laterality Date  . BUNIONECTOMY Left 2004 approx  . CARPAL TUNNEL RELEASE Bilateral right 2014;  left 2007  . COLONOSCOPY W/ POLYPECTOMY  09/24/2009  . CYSTO WITH HYDRODISTENSION [name] 10/15/2016   Procedure: CYSTOSCOPY/HYDRODISTENSION AND MARCAINE INSTILLATION;  Surgeon: Rana Snare, MD;  Location: [facility];  Service: Urology;  Laterality: [name];  . CYSTO/  HYDRODISTENTION/  INSTILLATION THERAPY  1997 approx  . HYSTEROSCOPY W/D&C  11/01/2004   POLYPECTOMY  . IR GENERIC HISTORICAL  01/26/2017   IR GASTROSTOMY TUBE MOD SED 01/26/2017 Sandi Mariscal, MD [facility]  . LAPAROSCOPIC VAGINAL HYSTERECTOMY WITH SALPINGO  OOPHORECTOMY Bilateral 04/20/2012  . TUBAL LIGATION  yrs ago    There were no vitals filed for this visit.      Subjective Assessment - 03/10/17 1022    Subjective  Pt reports that vision is improved in L eye and that she doesn't need stitches any longer.   Patient is accompained by: Family member   Currently in Pain? No/denies            [facility] OT Assessment - 03/10/17 0001      Precautions   Precautions Fall;Other (comment)   Precaution Comments decr ability to close L eye with decr vision       In sitting, AAROM shoulder flex BUEs with ball for AAROM shoulder flex with min-mod facilitation/cues for compensation.  In modified quadraped, cat/cow positions for incr scapular gliding/stability with min facilitation/cues.  Followed by rolling ball on mat in tall kneeling for AAROM shoulder flex BUEs for incr trunk control and scapular gliding with min-mod facilitation.  In quadraped over ball, shoulder extension with each UE with scapular retraction/depression with min-mod facilitation/cues.  Standing, AAROM shoulder flex/elbow ext with UE ranger along diagonal with min facilitation RUE and min-mod facilitation LUE.  In sitting, with shoulders positioned in ER, scapular retraction/depression with chest lift with min facilitation/cues.  Discussed progress with ADLs--see STGs section  Pt needed multiple rest breaks due to fatigue.  OT Short Term Goals - 03/10/17 1611      OT SHORT TERM GOAL #1   Title Patient will complete a home activity program daily with min prompting or set up    Time 4   Period Weeks   Status On-going     OT SHORT TERM GOAL #2   Title Patient will complete a home exercise program designed to improve range of motion in BUE's with no more than set up assistance   Time 4   Period Weeks   Status On-going     OT SHORT TERM GOAL #3   Title Patient will utilize bilateral upper extremities to cut a piece of meat during a meal  with modified independence   Time 4   Period Weeks   Status Achieved  03/10/17  per pt report     OT SHORT TERM GOAL #4   Title Patient will don compression hose with increased time and without physical assistance   Period Weeks   Status Achieved  Met 03/10/17 per pt report     OT SHORT TERM GOAL #5   Title Patient will demonstrate sufficient activity tolerance to complete light housekeeping task in standing for greater than 5 minutes.     Time 4   Period Weeks   Status On-going     OT SHORT TERM GOAL #6   Title Patient will demonstrate effective mid level reach to retrieve lightweight item less than 2 lbs from chest height shelf in standing using BUE's   Time 4   Period Weeks   Status New           OT Long Term Goals - 02/25/17 1643      OT LONG TERM GOAL #1   Title Patient will complete an updated home exercise program designed for UE strengthening daily   Time 8   Period Weeks   Status On-going     OT LONG TERM GOAL #2   Title Patient will prepare simple hot meal for she and her husband with no rest breaks- 30 min of activity   Time 8   Period Weeks   Status On-going     OT LONG TERM GOAL #3   Title Patient will complete laundry task- including transporting items to washer, and transferring to dryer, folding and putting away clothing with intermittent assistance only   Time 8   Period Weeks   Status On-going     OT LONG TERM GOAL #4   Title Patient will utilize bilateral upper extremities to blow dry and style her hair with modified independence   Time 8   Period Weeks   Status On-going     OT LONG TERM GOAL #5   Title Patient will demonstrate sufficient UE/LE strength and balance to reach to floor to retrieve a lightweight (no more than 2 lb) object without loss of balance   Time 8   Period Weeks   Status On-going     Long Term Additional Goals   Additional Long Term Goals Yes     OT LONG TERM GOAL #6   Title Patient will demonstrate sufficient  strength for unilateral reach to retrieve a 3 lb object from overhead shelf while standing with right hand and left hand (not bilateral reach)     OT LONG TERM GOAL #7   Title Patient will demonstrate sufficient strength to get down to floor to clean tub, and get back up without assistance   Time 8   Period  Weeks   Status New               Plan - 03/10/17 1610    Clinical Impression Statement Pt is progressing towards goals for incr UE functional use and is now able to cut food and don compression stockings.   Rehab Potential Good   OT Frequency 3x / week   OT Duration 4 weeks  then 2x week x 4 weeks   OT Treatment/Interventions Self-care/ADL training;Electrical Stimulation;Therapeutic exercise;Neuromuscular education;Energy conservation;Manual Therapy;Functional Mobility Training;DME and/or AE instruction;Visual/perceptual remediation/compensation;Cognitive remediation/compensation;Therapeutic activities;Balance training;Patient/family education   Plan continue with neuro re-ed BUEs, work toward [place] Initiated HEP shoulders    Consulted and Agree with Plan of Care Patient;Family member/caregiver      Patient will benefit from skilled therapeutic intervention in order to improve the following deficits and impairments:  Decreased coordination, Decreased range of motion, Decreased endurance, Decreased safety awareness, Decreased balance, Decreased cognition, Decreased strength, Impaired perceived functional ability, Impaired vision/preception, Impaired UE functional use, Decreased knowledge of use of DME, Impaired tone, Pain, Impaired sensation, Improper body mechanics  Visit Diagnosis: Muscle weakness (generalized)  Unsteadiness on feet  Other abnormalities of gait and mobility  Other disturbances of skin sensation  Other lack of coordination    Problem List Patient Active Problem List   Diagnosis Date Noted  . Acute blood loss anemia   .  Physical deconditioning   . Tracheostomy status ([place])   . Hypoxemia   . Dysphagia   . Numbness and tingling   . S/P percutaneous endoscopic gastrostomy (PEG) tube placement ([place])   . Bradycardia   . Left corneal abrasion   . Abnormal chest x-ray   . Acute respiratory failure with hypoxia ([place])   . Guillain Barr syndrome ([place])   . Atrial fibrillation with RVR ([place])   . Dysphagia, pharyngoesophageal phase   . History of ETT   . Encounter for central line placement   . Encounter for feeding tube placement   . Weakness of right lower extremity   . SOB (shortness of breath)   . Pneumonia   . Pressure injury of skin 01/16/2017  . Acute respiratory failure ([place])   . Hyponatremia with decreased serum osmolality   . Hyponatremia   . Quadriplegia and quadriparesis ([place]) 01/14/2017  . GBS (Guillain Barre syndrome) ([place]) 01/13/2017  . Weakness 01/12/2017  . Hypokalemia 01/12/2017  . Endometriosis 12/05/2013  . Chronic low back pain 08/19/2012  . HYPERLIPIDEMIA 12/10/2010  . COLONIC POLYPS, HX OF 12/13/2009  . HYPERGLYCEMIA, FASTING 11/22/2008  . Depression 02/07/2008  . ANOSMIA 02/07/2008  . HEADACHE, CHRONIC 02/07/2008  . MITRAL VALVE PROLAPSE 10/05/2007  . OSTEOPENIA 10/05/2007    [facility] 03/10/2017, 4:14 PM  [place] [address] [place], [place], [address] Phone: [phone]   Fax:  [phone]  Name: Silva Aamodt MRN: [id number] Date of Birth: 1951-07-02   Vianne Bulls, OTR/L [facility] [address] [place], [place]  [address] [phone] phone [phone] 03/10/17 4:20 PM

## 2017-03-10 NOTE — Telephone Encounter (Signed)
Error

## 2017-03-11 ENCOUNTER — Ambulatory Visit: Payer: BLUE CROSS/BLUE SHIELD | Admitting: Occupational Therapy

## 2017-03-11 ENCOUNTER — Ambulatory Visit: Payer: BLUE CROSS/BLUE SHIELD | Admitting: Physical Therapy

## 2017-03-12 ENCOUNTER — Encounter: Payer: Self-pay | Admitting: *Deleted

## 2017-03-16 ENCOUNTER — Ambulatory Visit: Payer: BLUE CROSS/BLUE SHIELD | Admitting: Occupational Therapy

## 2017-03-16 ENCOUNTER — Encounter: Payer: Self-pay | Admitting: Physical Therapy

## 2017-03-16 ENCOUNTER — Encounter: Payer: Self-pay | Admitting: *Deleted

## 2017-03-16 ENCOUNTER — Ambulatory Visit: Payer: BLUE CROSS/BLUE SHIELD | Attending: Physical Medicine & Rehabilitation | Admitting: Physical Therapy

## 2017-03-16 DIAGNOSIS — R278 Other lack of coordination: Secondary | ICD-10-CM | POA: Insufficient documentation

## 2017-03-16 DIAGNOSIS — R208 Other disturbances of skin sensation: Secondary | ICD-10-CM | POA: Diagnosis present

## 2017-03-16 DIAGNOSIS — R2689 Other abnormalities of gait and mobility: Secondary | ICD-10-CM

## 2017-03-16 DIAGNOSIS — M6281 Muscle weakness (generalized): Secondary | ICD-10-CM

## 2017-03-16 DIAGNOSIS — R2681 Unsteadiness on feet: Secondary | ICD-10-CM | POA: Insufficient documentation

## 2017-03-16 DIAGNOSIS — M25512 Pain in left shoulder: Secondary | ICD-10-CM

## 2017-03-16 NOTE — Therapy (Signed)
[place] [address] [place], [place], [address] Phone: [phone]   Fax:  [phone]  Occupational Therapy Treatment  Patient Details  Name: Joanne Morrison MRN: [id number] Date of Birth: 1951-12-02 Referring Provider: Dr Naaman Plummer  Encounter Date: 03/16/2017      OT End of Session - 03/16/17 1106    Visit Number 9   Number of Visits 20   Date for OT Re-Evaluation 04/25/17   Authorization Type [employer] 20 VISIT limit - no authorization required   Authorization - Visit Number 9   Authorization - Number of Visits 20   OT Start Time 1104   OT Stop Time 1145   OT Time Calculation (min) 41 min   Activity Tolerance Patient tolerated treatment well   Behavior During Therapy [facility] for tasks assessed/performed      Past Medical History:  Diagnosis Date  . Anxiety   . Arthritis    back  . Chronic cystitis   . Chronic low back pain   . Depression   . Diverticulosis of colon   . Hemorrhoids   . History of colon polyps    09-24-2009  rectal hyperplastic polyp/   and 2016 non-cancerous  . History of endometriosis   . History of panic attacks   . Hyperlipidemia   . Osteopenia   . Pelvic pain   . Sensation of pressure in bladder area   . Wears glasses     Past Surgical History:  Procedure Laterality Date  . BUNIONECTOMY Left 2004 approx  . CARPAL TUNNEL RELEASE Bilateral right 2014;  left 2007  . COLONOSCOPY W/ POLYPECTOMY  09/24/2009  . CYSTO WITH HYDRODISTENSION [name] 10/15/2016   Procedure: CYSTOSCOPY/HYDRODISTENSION AND MARCAINE INSTILLATION;  Surgeon: Rana Snare, MD;  Location: [facility];  Service: Urology;  Laterality: [name];  . CYSTO/  HYDRODISTENTION/  INSTILLATION THERAPY  1997 approx  . HYSTEROSCOPY W/D&C  11/01/2004   POLYPECTOMY  . IR GENERIC HISTORICAL  01/26/2017   IR GASTROSTOMY TUBE MOD SED 01/26/2017 Sandi Mariscal, MD [facility]  . LAPAROSCOPIC VAGINAL HYSTERECTOMY WITH SALPINGO  OOPHORECTOMY Bilateral 04/20/2012  . TUBAL LIGATION  yrs ago    There were no vitals filed for this visit.      Subjective Assessment - 03/16/17 1104    Subjective  Pt reports that she was very sore after last session and stayed in bed on Wed (cancelled appt).   Patient is accompained by: Family member   Currently in Pain? No/denies      In supine, AAROM shoulder flex and chest press with BUEs with ball for AAROM with min-mod facilitation/cues for LUE for compensation.  In sitting, AAROM shoulder flex BUEs with ball (low range) with mod cues for avoiding compensation/relaxing neck/shoulder, particularly RUE and min-mod facilitation for scapula.  In modified quadraped, cat/cow positions for incr scapular gliding/stability with mod facilitation/cues, particularly for depression.    Standing, AAROM shoulder flex/elbow ext with UE ranger along diagonal with mod facilitation RUE and min-mod facilitation LUE.  In sitting, with shoulders positioned in ER, scapular retraction/depression with chest lift with min facilitation/cues.  Pt needed multiple rest breaks due to fatigue.  Recommended pt avoid doing high range activities/reaching with UEs currently due to compensation patterns, decr awareness and risk of injury.  Pt/husband verbalized understanding.                      OT Education - 03/16/17 1218    Education Details Reviewed ER  for LUE in supine (from HEP) and husband hand placement   Person(s) Educated Patient   Methods Explanation;Demonstration;Verbal cues   Comprehension Verbalized understanding;Returned demonstration          OT Short Term Goals - 03/16/17 1219      OT SHORT TERM GOAL #1   Title Patient will complete a home activity program daily with min prompting or set up --check STGs 03/27/17   Time 4   Period Weeks   Status On-going     OT SHORT TERM GOAL #2   Title Patient will complete a home exercise program designed to improve range of  motion in BUE's with no more than set up assistance   Time 4   Period Weeks   Status On-going     OT SHORT TERM GOAL #3   Title Patient will utilize bilateral upper extremities to cut a piece of meat during a meal with modified independence   Time 4   Period Weeks   Status Achieved  03/10/17  per pt report     OT SHORT TERM GOAL #4   Title Patient will don compression hose with increased time and without physical assistance   Period Weeks   Status Achieved  Met 03/10/17 per pt report     OT SHORT TERM GOAL #5   Title Patient will demonstrate sufficient activity tolerance to complete light housekeeping task in standing for greater than 5 minutes.     Time 4   Period Weeks   Status On-going     OT SHORT TERM GOAL #6   Title Patient will demonstrate effective mid level reach to retrieve lightweight item less than 2 lbs from chest height shelf in standing using BUE's   Time 4   Period Weeks   Status New           OT Long Term Goals - 03/16/17 1219      OT LONG TERM GOAL #1   Title Patient will complete an updated home exercise program designed for UE strengthening daily--check LTGs 04/26/17   Time 8   Period Weeks   Status On-going     OT LONG TERM GOAL #2   Title Patient will prepare simple hot meal for she and her husband with no rest breaks- 30 min of activity   Time 8   Period Weeks   Status On-going     OT LONG TERM GOAL #3   Title Patient will complete laundry task- including transporting items to washer, and transferring to dryer, folding and putting away clothing with intermittent assistance only   Time 8   Period Weeks   Status On-going     OT LONG TERM GOAL #4   Title Patient will utilize bilateral upper extremities to blow dry and style her hair with modified independence   Time 8   Period Weeks   Status On-going     OT LONG TERM GOAL #5   Title Patient will demonstrate sufficient UE/LE strength and balance to reach to floor to retrieve a lightweight  (no more than 2 lb) object without loss of balance   Time 8   Period Weeks   Status On-going     OT LONG TERM GOAL #6   Title Patient will demonstrate sufficient strength for unilateral reach to retrieve a 3 lb object from overhead shelf while standing with right hand and left hand (not bilateral reach)     OT LONG TERM GOAL #7   Title Patient will  demonstrate sufficient strength to get down to floor to clean tub, and get back up without assistance   Time 8   Period Weeks   Status New               Plan - 03/16/17 1218    Rehab Potential Good   OT Frequency 3x / week   OT Duration 4 weeks  then 2x week x 4 weeks   OT Treatment/Interventions Self-care/ADL training;Electrical Stimulation;Therapeutic exercise;Neuromuscular education;Energy conservation;Manual Therapy;Functional Mobility Training;DME and/or AE instruction;Visual/perceptual remediation/compensation;Cognitive remediation/compensation;Therapeutic activities;Balance training;Patient/family education   Plan continue with neuro re-ed BUEs   OT Home Exercise Plan Initiated HEP shoulders    Consulted and Agree with Plan of Care Patient;Family member/caregiver      Patient will benefit from skilled therapeutic intervention in order to improve the following deficits and impairments:  Decreased coordination, Decreased range of motion, Decreased endurance, Decreased safety awareness, Decreased balance, Decreased cognition, Decreased strength, Impaired perceived functional ability, Impaired vision/preception, Impaired UE functional use, Decreased knowledge of use of DME, Impaired tone, Pain, Impaired sensation, Improper body mechanics  Visit Diagnosis: Muscle weakness (generalized)  Unsteadiness on feet  Other abnormalities of gait and mobility  Other disturbances of skin sensation  Other lack of coordination  Acute pain of left shoulder    Problem List Patient Active Problem List   Diagnosis Date Noted  . Acute  blood loss anemia   . Physical deconditioning   . Tracheostomy status ([place])   . Hypoxemia   . Dysphagia   . Numbness and tingling   . S/P percutaneous endoscopic gastrostomy (PEG) tube placement ([place])   . Bradycardia   . Left corneal abrasion   . Abnormal chest x-ray   . Acute respiratory failure with hypoxia ([place])   . Guillain Barr syndrome ([place])   . Atrial fibrillation with RVR ([place])   . Dysphagia, pharyngoesophageal phase   . History of ETT   . Encounter for central line placement   . Encounter for feeding tube placement   . Weakness of right lower extremity   . SOB (shortness of breath)   . Pneumonia   . Pressure injury of skin 01/16/2017  . Acute respiratory failure ([place])   . Hyponatremia with decreased serum osmolality   . Hyponatremia   . Quadriplegia and quadriparesis ([place]) 01/14/2017  . GBS (Guillain Barre syndrome) ([place]) 01/13/2017  . Weakness 01/12/2017  . Hypokalemia 01/12/2017  . Endometriosis 12/05/2013  . Chronic low back pain 08/19/2012  . HYPERLIPIDEMIA 12/10/2010  . COLONIC POLYPS, HX OF 12/13/2009  . HYPERGLYCEMIA, FASTING 11/22/2008  . Depression 02/07/2008  . ANOSMIA 02/07/2008  . HEADACHE, CHRONIC 02/07/2008  . MITRAL VALVE PROLAPSE 10/05/2007  . OSTEOPENIA 10/05/2007    [facility] 03/16/2017, 12:21 PM  [place] [address] [place], [place], [address] Phone: [phone]   Fax:  [phone]  Name: Joanne Morrison MRN: [id number] Date of Birth: 06-07-1951   Vianne Bulls, OTR/L [facility] [address] [place], [place]  [address] [phone] phone [phone] 03/16/17 12:21 PM

## 2017-03-16 NOTE — Therapy (Signed)
[facility] [address] [place], [place], [address] Phone: [phone]   Fax:  [phone]  Physical Therapy Treatment  Patient Details  Name: Joanne Morrison MRN: [id number] Date of Birth: Aug 28, 1951 Referring Provider: Dr. Alger Simons  Encounter Date: 03/16/2017      PT End of Session - 03/16/17 1710    Visit Number 6   Number of Visits 16   Date for PT Re-Evaluation 04/25/17   Authorization Type BCBS   Authorization - Visit Number 6   Authorization - Number of Visits 20   PT Start Time 6962   PT Stop Time 1057   PT Time Calculation (min) 42 min   Equipment Utilized During Treatment Gait belt   Activity Tolerance Patient limited by fatigue  multiple seated rest breaks   Behavior During Therapy WFL for tasks assessed/performed      Past Medical History:  Diagnosis Date  . Anxiety   . Arthritis    back  . Chronic cystitis   . Chronic low back pain   . Depression   . Diverticulosis of colon   . Hemorrhoids   . History of colon polyps    09-24-2009  rectal hyperplastic polyp/   and 2016 non-cancerous  . History of endometriosis   . History of panic attacks   . Hyperlipidemia   . Osteopenia   . Pelvic pain   . Sensation of pressure in bladder area   . Wears glasses     Past Surgical History:  Procedure Laterality Date  . BUNIONECTOMY Left 2004 approx  . CARPAL TUNNEL RELEASE Bilateral right 2014;  left 2007  . COLONOSCOPY W/ POLYPECTOMY  09/24/2009  . CYSTO WITH HYDRODISTENSION [name] 10/15/2016   Procedure: CYSTOSCOPY/HYDRODISTENSION AND MARCAINE INSTILLATION;  Surgeon: Rana Snare, MD;  Location: [facility]  Service: Urology;  Laterality: [name];  . CYSTO/  HYDRODISTENTION/  INSTILLATION THERAPY  1997 approx  . HYSTEROSCOPY W/D&C  11/01/2004   POLYPECTOMY  . IR GENERIC HISTORICAL  01/26/2017   IR GASTROSTOMY TUBE MOD SED 01/26/2017 Sandi Mariscal, MD [facility]  . LAPAROSCOPIC  VAGINAL HYSTERECTOMY WITH SALPINGO OOPHORECTOMY Bilateral 04/20/2012  . TUBAL LIGATION  yrs ago    There were no vitals filed for this visit.      Subjective Assessment - 03/16/17 1016    Subjective I was so sore after last visit that I could not come last Wednesday. I know now that I need to tell you guys when I've had enough.    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 No/denies                         [facility] Adult PT Treatment/Exercise - 03/16/17 0001      Transfers   Transfers Sit to Stand;Stand to Sit   Sit to Stand 4: Min guard   Stand to Sit 4: Min guard   Number of Reps 10 reps;1 set   Comments no UE support - performed on blue AIrEx     Ambulation/Gait   Ambulation/Gait Assistance 4: Min guard   Ambulation Distance (Feet) 240 Feet   Assistive device None   Gait Pattern Step-through pattern;Decreased arm swing -  right;Decreased arm swing - left;Wide base of support     Neuro Re-ed    Neuro Re-ed Details  on mat table, tall kneeling to 1/2 kneeling with min UE support (HHA); raise left x 5; raise rt x 10     Lumbar Exercises: Stretches   Active Hamstring Stretch 1 rep;30 seconds  each leg; sitting, leg extended             Balance Exercises - 03/16/17 1706      Balance Exercises: Standing   Other Standing Exercises braided walking in front, each leg 2 lengths // bars bil UE support             PT Short Term Goals - 02/24/17 1519      PT SHORT TERM GOAL #1   Title Pt will ambulate household distances modified independently without use of RW.  03-27-17   Time 4   Period Weeks   Status New     PT SHORT TERM GOAL #2   Title  Amb. 500' with RW with SBA for increased community accessibility.  03-27-17   Time 4   Period Weeks   Status New     PT SHORT TERM GOAL #3   Title Improve TUG score to </= 13.5 secs for decreased fall risk.  03-27-17   Baseline 15.22 secs without RW   Time 4   Period Weeks   Status New     PT SHORT TERM GOAL #4   Title Increase gait velocity to >/= 2.6 ft/sec without device for incr. gait efficiency.  03-27-17   Time 4   Period Weeks   Status New     PT SHORT TERM GOAL #5   Title Increase Berg score by at least 5 points to reduce fall risk.  03-27-17   Baseline TBA next session   Time 4   Period Weeks   Status New     Additional Short Term Goals   Additional Short Term Goals Yes     PT SHORT TERM GOAL #6   Title Independent in HEP for lower extremity strengthening and balance exercises.  03-27-17   Time 4   Period Weeks   Status New           PT Long Term Goals - 02/24/17 1524      PT LONG TERM GOAL #1   Title Modified independent ambulation 1000' on even surfaces without device.  04-26-17   Time 8   Period Weeks   Status New     PT LONG TERM GOAL #2   Title Increase Berg score by at least 10 points for improved balance and for reduced fall risk.  04-26-17   Time 8   Period Weeks   Status New     PT LONG TERM GOAL #3   Title Improve TUG score to </= 11.5 secs without device to demo improved functional mobility.  04-26-17   Baseline 17.38 secs with RW:  15.22 secs without RW   Time 8   Period Weeks   Status New     PT LONG TERM GOAL #4   Title Negotiate steps using step over step sequence without rail modified independently.  04-26-17   Baseline SBA with use of 2 rails step by step   Time 8   Period Weeks   Status New     PT LONG TERM GOAL #5   Title Independent in updated HEP for strengthening and balance.  04-26-17   Time 8  Period Weeks   Status New               Plan - 03/16/17 1711    Clinical Impression Statement Session focused on  strengthening, balance, and gait training. Patient continues with bil LE tremors as she fatigues and offered/given multiple rest breaks during session. Anticipate continued progress towards LTGs.    Rehab Potential Good   PT Frequency 2x / week   PT Duration 8 weeks   PT Treatment/Interventions ADLs/Self Care Home Management;Gait training;Stair training;Functional mobility training;Therapeutic activities;Therapeutic exercise;Balance training;Neuromuscular re-education;Patient/family education   PT Next Visit Plan cont. core and LE strengthening and balance/gait training; work on floor transfers   [place] see above - plan to add theraband for LE strengthening;  add hip extension in standing to HEP - added hip ext with red theraband on 03-10-17   Consulted and Agree with Plan of Care Patient;Family member/caregiver   Family Member Consulted spouse [place]      Patient will benefit from skilled therapeutic intervention in order to improve the following deficits and impairments:  Abnormal gait, Decreased endurance, Decreased activity tolerance, Decreased balance, Decreased mobility, Decreased strength, Impaired vision/preception, Pain, Impaired sensation  Visit Diagnosis: Muscle weakness (generalized)  Unsteadiness on feet  Other abnormalities of gait and mobility     Problem List Patient Active Problem List   Diagnosis Date Noted  . Acute blood loss anemia   . Physical deconditioning   . Tracheostomy status ([place])   . Hypoxemia   . Dysphagia   . Numbness and tingling   . S/P percutaneous endoscopic gastrostomy (PEG) tube placement ([place])   . Bradycardia   . Left corneal abrasion   . Abnormal chest x-ray   . Acute respiratory failure with hypoxia ([place])   . Guillain Barr syndrome ([place])   . Atrial fibrillation with RVR ([place])   . Dysphagia, pharyngoesophageal phase   . History of ETT   . Encounter for central line placement   . Encounter for feeding tube placement   .  Weakness of right lower extremity   . SOB (shortness of breath)   . Pneumonia   . Pressure injury of skin 01/16/2017  . Acute respiratory failure ([place])   . Hyponatremia with decreased serum osmolality   . Hyponatremia   . Quadriplegia and quadriparesis ([place]) 01/14/2017  . GBS (Guillain Barre syndrome) ([place]) 01/13/2017  . Weakness 01/12/2017  . Hypokalemia 01/12/2017  . Endometriosis 12/05/2013  . Chronic low back pain 08/19/2012  . HYPERLIPIDEMIA 12/10/2010  . COLONIC POLYPS, HX OF 12/13/2009  . HYPERGLYCEMIA, FASTING 11/22/2008  . Depression 02/07/2008  . ANOSMIA 02/07/2008  . HEADACHE, CHRONIC 02/07/2008  . MITRAL VALVE PROLAPSE 10/05/2007  . OSTEOPENIA 10/05/2007    Jeanie Cooks Rose-Marie Hickling. PT 03/16/2017, 5:14 PM  [facility] [address] [place], [place], [address] Phone: [phone]   Fax:  [phone]  Name: Suellen Durocher MRN: [id number] Date of Birth: 1951/04/12

## 2017-03-17 ENCOUNTER — Ambulatory Visit: Payer: BLUE CROSS/BLUE SHIELD | Admitting: Physical Therapy

## 2017-03-17 ENCOUNTER — Encounter: Payer: Self-pay | Admitting: Physical Therapy

## 2017-03-17 ENCOUNTER — Ambulatory Visit: Payer: BLUE CROSS/BLUE SHIELD | Admitting: Occupational Therapy

## 2017-03-17 DIAGNOSIS — R2681 Unsteadiness on feet: Secondary | ICD-10-CM

## 2017-03-17 DIAGNOSIS — R278 Other lack of coordination: Secondary | ICD-10-CM

## 2017-03-17 DIAGNOSIS — M6281 Muscle weakness (generalized): Secondary | ICD-10-CM | POA: Diagnosis not present

## 2017-03-17 DIAGNOSIS — M25512 Pain in left shoulder: Secondary | ICD-10-CM

## 2017-03-17 DIAGNOSIS — R208 Other disturbances of skin sensation: Secondary | ICD-10-CM

## 2017-03-17 NOTE — Therapy (Signed)
[facility] [address] [place], [place], [address] Phone: [phone]   Fax:  [phone]  Occupational Therapy Treatment  Patient Details  Name: Joanne Morrison MRN: [id number] Date of Birth: [birth year] Referring Provider: Dr Naaman Plummer  Encounter Date: 03/17/2017      OT End of Session - 03/17/17 1108    Visit Number 10   Number of Visits 20   Date for OT Re-Evaluation 04/25/17   Authorization Type [employer] 20 VISIT limit - no authorization required   Authorization - Visit Number 10   Authorization - Number of Visits 20   OT Start Time 1104   OT Stop Time 1145   OT Time Calculation (min) 41 min   Activity Tolerance Patient tolerated treatment well   Behavior During Therapy [facility] for tasks assessed/performed      Past Medical History:  Diagnosis Date  . Anxiety   . Arthritis    back  . Chronic cystitis   . Chronic low back pain   . Depression   . Diverticulosis of colon   . Hemorrhoids   . History of colon polyps    09-24-2009  rectal hyperplastic polyp/   and 2016 non-cancerous  . History of endometriosis   . History of panic attacks   . Hyperlipidemia   . Osteopenia   . Pelvic pain   . Sensation of pressure in bladder area   . Wears glasses     Past Surgical History:  Procedure Laterality Date  . BUNIONECTOMY Left 2004 approx  . CARPAL TUNNEL RELEASE Bilateral right 2014;  left 2007  . COLONOSCOPY W/ POLYPECTOMY  09/24/2009  . CYSTO WITH HYDRODISTENSION [name] 10/15/2016   Procedure: CYSTOSCOPY/HYDRODISTENSION AND MARCAINE INSTILLATION;  Surgeon: Rana Snare, MD;  Location: [facility];  Service: Urology;  Laterality: [name];  . CYSTO/  HYDRODISTENTION/  INSTILLATION THERAPY  1997 approx  . HYSTEROSCOPY W/D&C  11/01/2004   POLYPECTOMY  . IR GENERIC HISTORICAL  01/26/2017   IR GASTROSTOMY TUBE MOD SED 01/26/2017 Sandi Mariscal, MD [facility]  . LAPAROSCOPIC VAGINAL HYSTERECTOMY WITH SALPINGO  OOPHORECTOMY Bilateral 04/20/2012  . TUBAL LIGATION  yrs ago    There were no vitals filed for this visit.      Subjective Assessment - 03/17/17 1256    Subjective  Pt reports that she was a little sore last night, but it was gone this morning.   Patient is accompained by: Family member   Currently in Pain? No/denies         In supine, AAROM shoulder flex and chest press with BUEs with ball for AAROM with min-mod facilitation/cues for LUE and min cues for RUE.  Scapular retraction with chest lift in supine, followed by scapular depression.  L ER AROM with min facilitation.  In prone, R scapular ex with shoulders in flex, abduction, and row positions with min cueing/facilitation for compensation at times.  Wt. Bearing on elbows for scapular depression with chest lift with min-mod cues.    In standing, low to mid range functional reaching with min cues/facilitation for normal movement patterns with each UE.  In sitting, with shoulders positioned in ER, scapular retraction/depression with with min facilitation/cues.                       OT Short Term Goals - 03/16/17 1219      OT SHORT TERM GOAL #1   Title Patient will complete a home activity program daily with min  prompting or set up --check STGs 03/27/17   Time 4   Period Weeks   Status On-going     OT SHORT TERM GOAL #2   Title Patient will complete a home exercise program designed to improve range of motion in BUE's with no more than set up assistance   Time 4   Period Weeks   Status On-going     OT SHORT TERM GOAL #3   Title Patient will utilize bilateral upper extremities to cut a piece of meat during a meal with modified independence   Time 4   Period Weeks   Status Achieved  03/10/17  per pt report     OT SHORT TERM GOAL #4   Title Patient will don compression hose with increased time and without physical assistance   Period Weeks   Status Achieved  Met 03/10/17 per pt report     OT SHORT  TERM GOAL #5   Title Patient will demonstrate sufficient activity tolerance to complete light housekeeping task in standing for greater than 5 minutes.     Time 4   Period Weeks   Status On-going     OT SHORT TERM GOAL #6   Title Patient will demonstrate effective mid level reach to retrieve lightweight item less than 2 lbs from chest height shelf in standing using BUE's   Time 4   Period Weeks   Status New           OT Long Term Goals - 03/16/17 1219      OT LONG TERM GOAL #1   Title Patient will complete an updated home exercise program designed for UE strengthening daily--check LTGs 04/26/17   Time 8   Period Weeks   Status On-going     OT LONG TERM GOAL #2   Title Patient will prepare simple hot meal for she and her husband with no rest breaks- 30 min of activity   Time 8   Period Weeks   Status On-going     OT LONG TERM GOAL #3   Title Patient will complete laundry task- including transporting items to washer, and transferring to dryer, folding and putting away clothing with intermittent assistance only   Time 8   Period Weeks   Status On-going     OT LONG TERM GOAL #4   Title Patient will utilize bilateral upper extremities to blow dry and style her hair with modified independence   Time 8   Period Weeks   Status On-going     OT LONG TERM GOAL #5   Title Patient will demonstrate sufficient UE/LE strength and balance to reach to floor to retrieve a lightweight (no more than 2 lb) object without loss of balance   Time 8   Period Weeks   Status On-going     OT LONG TERM GOAL #6   Title Patient will demonstrate sufficient strength for unilateral reach to retrieve a 3 lb object from overhead shelf while standing with right hand and left hand (not bilateral reach)     OT LONG TERM GOAL #7   Title Patient will demonstrate sufficient strength to get down to floor to clean tub, and get back up without assistance   Time 8   Period Weeks   Status New                Plan - 03/17/17 1251    Clinical Impression Statement Pt is progressing towards goals with improved control and less compensation for  low-mid range functional reaching.     Rehab Potential Good   OT Frequency 3x / week   OT Duration 4 weeks  then 2x week x 4 weeks   OT Treatment/Interventions Self-care/ADL training;Electrical Stimulation;Therapeutic exercise;Neuromuscular education;Energy conservation;Manual Therapy;Functional Mobility Training;DME and/or AE instruction;Visual/perceptual remediation/compensation;Cognitive remediation/compensation;Therapeutic activities;Balance training;Patient/family education   Plan check STGs, reduce to 2x/wk next week   OT Home Exercise Plan Initiated HEP shoulders    Consulted and Agree with Plan of Care Patient;Family member/caregiver      Patient will benefit from skilled therapeutic intervention in order to improve the following deficits and impairments:  Decreased coordination, Decreased range of motion, Decreased endurance, Decreased safety awareness, Decreased balance, Decreased cognition, Decreased strength, Impaired perceived functional ability, Impaired vision/preception, Impaired UE functional use, Decreased knowledge of use of DME, Impaired tone, Pain, Impaired sensation, Improper body mechanics  Visit Diagnosis: Muscle weakness (generalized)  Unsteadiness on feet  Other disturbances of skin sensation  Other lack of coordination  Acute pain of left shoulder    Problem List Patient Active Problem List   Diagnosis Date Noted  . Acute blood loss anemia   . Physical deconditioning   . Tracheostomy status ([place])   . Hypoxemia   . Dysphagia   . Numbness and tingling   . S/P percutaneous endoscopic gastrostomy (PEG) tube placement ([place])   . Bradycardia   . Left corneal abrasion   . Abnormal chest x-ray   . Acute respiratory failure with hypoxia ([place])   . Guillain Barr syndrome ([place])   . Atrial fibrillation with  RVR ([place])   . Dysphagia, pharyngoesophageal phase   . History of ETT   . Encounter for central line placement   . Encounter for feeding tube placement   . Weakness of right lower extremity   . SOB (shortness of breath)   . Pneumonia   . Pressure injury of skin 01/16/2017  . Acute respiratory failure ([place])   . Hyponatremia with decreased serum osmolality   . Hyponatremia   . Quadriplegia and quadriparesis ([place]) 01/14/2017  . GBS (Guillain Barre syndrome) ([place]) 01/13/2017  . Weakness 01/12/2017  . Hypokalemia 01/12/2017  . Endometriosis 12/05/2013  . Chronic low back pain 08/19/2012  . HYPERLIPIDEMIA 12/10/2010  . COLONIC POLYPS, HX OF 12/13/2009  . HYPERGLYCEMIA, FASTING 11/22/2008  . Depression 02/07/2008  . ANOSMIA 02/07/2008  . HEADACHE, CHRONIC 02/07/2008  . MITRAL VALVE PROLAPSE 10/05/2007  . OSTEOPENIA 10/05/2007    [facility] 03/17/2017, 12:58 PM  [facility] [address] [place], [place], [address] Phone: [phone]   Fax:  [phone]  Name: Joanne Morrison MRN: [id number] Date of Birth: [birth year]   Vianne Bulls, OTR/L [facility] [address] [place], [place]  [phone] phone [phone] 03/17/17 12:58 PM

## 2017-03-17 NOTE — Therapy (Signed)
[facility] [address] [place], [place], [address] Phone: [phone]   Fax:  [phone]  Physical Therapy Treatment  Patient Details  Name: Joanne Morrison MRN: [id number] Date of Birth: 1951-06-17 Referring Provider: Dr. Alger Simons  Encounter Date: 03/17/2017      PT End of Session - 03/17/17 1300    Visit Number 7   Number of Visits 16   Date for PT Re-Evaluation 04/25/17   Authorization Type BCBS   Authorization - Visit Number 7   Authorization - Number of Visits 20   PT Start Time 4081   PT Stop Time 1229   PT Time Calculation (min) 41 min   Equipment Utilized During Treatment Gait belt   Activity Tolerance Patient tolerated treatment well  multiple seated rest breaks   Behavior During Therapy WFL for tasks assessed/performed      Past Medical History:  Diagnosis Date  . Anxiety   . Arthritis    back  . Chronic cystitis   . Chronic low back pain   . Depression   . Diverticulosis of colon   . Hemorrhoids   . History of colon polyps    09-24-2009  rectal hyperplastic polyp/   and 2016 non-cancerous  . History of endometriosis   . History of panic attacks   . Hyperlipidemia   . Osteopenia   . Pelvic pain   . Sensation of pressure in bladder area   . Wears glasses     Past Surgical History:  Procedure Laterality Date  . BUNIONECTOMY Left 2004 approx  . CARPAL TUNNEL RELEASE Bilateral right 2014;  left 2007  . COLONOSCOPY W/ POLYPECTOMY  09/24/2009  . CYSTO WITH HYDRODISTENSION [name] 10/15/2016   Procedure: CYSTOSCOPY/HYDRODISTENSION AND MARCAINE INSTILLATION;  Surgeon: Rana Snare, MD;  Location: [facility];  Service: Urology;  Laterality: [name];  . CYSTO/  HYDRODISTENTION/  INSTILLATION THERAPY  1997 approx  . HYSTEROSCOPY W/D&C  11/01/2004   POLYPECTOMY  . IR GENERIC HISTORICAL  01/26/2017   IR GASTROSTOMY TUBE MOD SED 01/26/2017 Sandi Mariscal, MD [facility]  . LAPAROSCOPIC  VAGINAL HYSTERECTOMY WITH SALPINGO OOPHORECTOMY Bilateral 04/20/2012  . TUBAL LIGATION  yrs ago    There were no vitals filed for this visit.      Subjective Assessment - 03/17/17 1152    Subjective No pain today. Sore middle of the night, but woke up fine.    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   Patient Stated Goals walk without walker, improve balance and leg strength                         [facility] Adult PT Treatment/Exercise - 03/17/17 1155      Ambulation/Gait   Ambulation/Gait Assistance 5: Supervision   Ambulation Distance (Feet) 120 Feet   Gait Pattern Step-through pattern;Decreased arm swing - right;Decreased arm swing - left;Wide base of support   Ambulation Surface Level;Indoor     Exercises   Exercises Other Exercises   Other Exercises  seated on green ball; alternating raising each arm to 90* shoulder flexion x 10; lateral weight shifts with trunk lengthening/shortening  10 each side     Lumbar Exercises: Machines for Strengthening   Leg Press 50# 10 reps. 60# 2 sets 15 reps  Lumbar Exercises: Seated   Hip Flexion on Ball AROM;Both;5 reps  alternating     Knee/Hip Exercises: Stretches   Active Hamstring Stretch Both;2 reps;30 seconds  seated x1 set; supine with strap x 1 set   Gastroc Stretch Both;1 rep;30 seconds  runners stretch     Knee/Hip Exercises: Standing   Forward Step Up Both;1 set;15 reps;Hand Hold: 2;Step Height: 6"  light UE      Knee/Hip Exercises: Seated   Stool Scoot - Round Trips 25 ft x 1; 30 ft x 1             Balance Exercises - 03/17/17 1255      Balance Exercises: Standing   Standing Eyes Opened Narrow base of support (BOS);Head turns;Foam/compliant surface;Other reps (comment)  10 reps            PT Education - 03/17/17 1257    Education provided Yes   Education Details Hamstring stretch   Person(s) Educated Patient;Spouse   Methods Explanation;Demonstration;Verbal cues  forgot to print handout; will provide next session   Comprehension Verbalized understanding;Returned demonstration;Need further instruction          PT Short Term Goals - 02/24/17 1519      PT SHORT TERM GOAL #1   Title Pt will ambulate household distances modified independently without use of RW.  03-27-17   Time 4   Period Weeks   Status New     PT SHORT TERM GOAL #2   Title Amb. 500' with RW with SBA for increased community accessibility.  03-27-17   Time 4   Period Weeks   Status New     PT SHORT TERM GOAL #3   Title Improve TUG score to </= 13.5 secs for decreased fall risk.  03-27-17   Baseline 15.22 secs without RW   Time 4   Period Weeks   Status New     PT SHORT TERM GOAL #4   Title Increase gait velocity to >/= 2.6 ft/sec without device for incr. gait efficiency.  03-27-17   Time 4   Period Weeks   Status New     PT SHORT TERM GOAL #5   Title Increase Berg score by at least 5 points to reduce fall risk.  03-27-17   Baseline TBA next session   Time 4   Period Weeks   Status New     Additional Short Term Goals   Additional Short Term Goals Yes     PT SHORT TERM GOAL #6   Title Independent in HEP for lower extremity strengthening and balance exercises.  03-27-17   Time 4   Period Weeks   Status New           PT Long Term Goals - 02/24/17 1524      PT LONG TERM GOAL #1   Title Modified independent ambulation 1000' on even surfaces without device.  04-26-17   Time 8   Period Weeks   Status New     PT LONG TERM GOAL #2   Title Increase Berg score by at least 10 points for improved balance and for reduced fall risk.  04-26-17   Time 8   Period Weeks   Status New     PT LONG TERM GOAL #3   Title Improve TUG score to </= 11.5 secs without device to demo  improved functional mobility.  04-26-17   Baseline 17.38 secs with RW:  15.22 secs without RW   Time 8  Period Weeks   Status New     PT LONG TERM GOAL #4   Title Negotiate steps using step over step sequence without rail modified independently.  04-26-17   Baseline SBA with use of 2 rails step by step   Time 8   Period Weeks   Status New     PT LONG TERM GOAL #5   Title Independent in updated HEP for strengthening and balance.  04-26-17   Time 8   Period Weeks   Status New               Plan - 03/17/17 1302    Clinical Impression Statement Session focused on strengthening legs and core, ROM, and balance training. Patient continues with bil LE tremors with sustained postures (most evident with balance activities). Patient reports she is pleased with her progress.   PT Frequency 2x / week   PT Duration 8 weeks   PT Treatment/Interventions ADLs/Self Care Home Management;Gait training;Stair training;Functional mobility training;Therapeutic activities;Therapeutic exercise;Balance training;Neuromuscular re-education;Patient/family education   PT Next Visit Plan cont. core and LE strengthening and balance/gait training; work on floor transfers (pt request)   PT Home Exercise Plan see above - plan to add theraband for LE strengthening;  add hip extension in standing to HEP - added hip ext with red theraband on 03-10-17   Consulted and Agree with Plan of Care Patient;Family member/caregiver   Family Member Consulted spouse [place]      Patient will benefit from skilled therapeutic intervention in order to improve the following deficits and impairments:  Abnormal gait, Decreased endurance, Decreased activity tolerance, Decreased balance, Decreased mobility, Decreased strength, Impaired vision/preception, Pain, Impaired sensation  Visit Diagnosis: Muscle weakness (generalized)  Unsteadiness on feet     Problem List Patient Active Problem List   Diagnosis Date Noted  . Acute  blood loss anemia   . Physical deconditioning   . Tracheostomy status ([place])   . Hypoxemia   . Dysphagia   . Numbness and tingling   . S/P percutaneous endoscopic gastrostomy (PEG) tube placement ([place])   . Bradycardia   . Left corneal abrasion   . Abnormal chest x-ray   . Acute respiratory failure with hypoxia ([place])   . Guillain Barr syndrome ([place])   . Atrial fibrillation with RVR ([place])   . Dysphagia, pharyngoesophageal phase   . History of ETT   . Encounter for central line placement   . Encounter for feeding tube placement   . Weakness of right lower extremity   . SOB (shortness of breath)   . Pneumonia   . Pressure injury of skin 01/16/2017  . Acute respiratory failure ([place])   . Hyponatremia with decreased serum osmolality   . Hyponatremia   . Quadriplegia and quadriparesis ([place]) 01/14/2017  . GBS (Guillain Barre syndrome) ([place]) 01/13/2017  . Weakness 01/12/2017  . Hypokalemia 01/12/2017  . Endometriosis 12/05/2013  . Chronic low back pain 08/19/2012  . HYPERLIPIDEMIA 12/10/2010  . COLONIC POLYPS, HX OF 12/13/2009  . HYPERGLYCEMIA, FASTING 11/22/2008  . Depression 02/07/2008  . ANOSMIA 02/07/2008  . HEADACHE, CHRONIC 02/07/2008  . MITRAL VALVE PROLAPSE 10/05/2007  . OSTEOPENIA 10/05/2007    Rexanne Mano, PT 03/17/2017, 1:06 PM  [facility] [address] [place], [place], [address] Phone: [phone]   Fax:  [phone]  Name: Joanne Morrison MRN: [id number] Date of Birth: 08-Jun-1951

## 2017-03-17 NOTE — Patient Instructions (Addendum)
Hamstring: Towel Stretch (Supine)    Lie on back. Loop towel around left foot, hip and knee at 90. Straighten knee and pull foot toward body. Hold __30_ seconds. Relax. Repeat _3__ times. Do _1__ times a day. Repeat with other leg.    Copyright  VHI. All rights reserved.    Chair Sitting    Sit at edge of seat, spine straight, one leg extended. Bend forward from the hip, keeping spine straight.  Hold __30_ seconds. Repeat __3_ times per session for each leg. Do _1__ sessions per day.  Copyright  VHI. All rights reserved.

## 2017-03-19 ENCOUNTER — Ambulatory Visit: Payer: BLUE CROSS/BLUE SHIELD | Admitting: Physical Therapy

## 2017-03-19 ENCOUNTER — Ambulatory Visit: Payer: BLUE CROSS/BLUE SHIELD | Admitting: Occupational Therapy

## 2017-03-19 ENCOUNTER — Encounter: Payer: Self-pay | Admitting: Occupational Therapy

## 2017-03-19 DIAGNOSIS — R2681 Unsteadiness on feet: Secondary | ICD-10-CM

## 2017-03-19 DIAGNOSIS — M25512 Pain in left shoulder: Secondary | ICD-10-CM

## 2017-03-19 DIAGNOSIS — R208 Other disturbances of skin sensation: Secondary | ICD-10-CM

## 2017-03-19 DIAGNOSIS — M6281 Muscle weakness (generalized): Secondary | ICD-10-CM

## 2017-03-19 DIAGNOSIS — R278 Other lack of coordination: Secondary | ICD-10-CM

## 2017-03-19 NOTE — Therapy (Signed)
[facility] [address] [place], [place], [address] Phone: [phone]   Fax:  [phone]  Occupational Therapy Treatment  Patient Details  Name: Joanne Morrison MRN: [id number] Date of Birth: [birth year] Referring Provider: Dr Naaman Plummer  Encounter Date: 03/19/2017      OT End of Session - 03/19/17 1604    Visit Number 11   Number of Visits 20   Date for OT Re-Evaluation 04/25/17   Authorization Type [employer] 20 VISIT limit - no authorization required   Authorization - Visit Number 11   Authorization - Number of Visits 20   OT Start Time 9449   OT Stop Time 1530   OT Time Calculation (min) 45 min   Activity Tolerance Patient tolerated treatment well   Behavior During Therapy [facility] for tasks assessed/performed      Past Medical History:  Diagnosis Date  . Anxiety   . Arthritis    back  . Chronic cystitis   . Chronic low back pain   . Depression   . Diverticulosis of colon   . Hemorrhoids   . History of colon polyps    09-24-2009  rectal hyperplastic polyp/   and 2016 non-cancerous  . History of endometriosis   . History of panic attacks   . Hyperlipidemia   . Osteopenia   . Pelvic pain   . Sensation of pressure in bladder area   . Wears glasses     Past Surgical History:  Procedure Laterality Date  . BUNIONECTOMY Left 2004 approx  . CARPAL TUNNEL RELEASE Bilateral right 2014;  left 2007  . COLONOSCOPY W/ POLYPECTOMY  09/24/2009  . CYSTO WITH HYDRODISTENSION [name] 10/15/2016   Procedure: CYSTOSCOPY/HYDRODISTENSION AND MARCAINE INSTILLATION;  Surgeon: Rana Snare, MD;  Location[facility];  Service: Urology;  Laterality: [name];  . CYSTO/  HYDRODISTENTION/  INSTILLATION THERAPY  1997 approx  . HYSTEROSCOPY W/D&C  11/01/2004   POLYPECTOMY  . IR GENERIC HISTORICAL  01/26/2017   IR GASTROSTOMY TUBE MOD SED 01/26/2017 Sandi Mariscal, MD [facility]  . LAPAROSCOPIC VAGINAL HYSTERECTOMY WITH SALPINGO  OOPHORECTOMY Bilateral 04/20/2012  . TUBAL LIGATION  yrs ago    There were no vitals filed for this visit.      Subjective Assessment - 03/19/17 1557    Subjective  I am able to wash and dry dishes, I have to rest in between.     Patient is accompained by: Family member   Currently in Pain? No/denies   Pain Score 0-No pain                      OT Treatments/Exercises (OP) - 03/19/17 0001      ADLs   Home Maintenance Patient reports that she was able to clean the tub, getting down onto knees and back up from the floor!  Reviewed floor to sit transfers using hands - leading first with stronger right, than with weaker left.  Patient more reliant on UE's when leading with left leg.     ADL Comments Reviewed short term goals.  Patient making excellent progress toward OT goals.  Patient engaging in IADL activiity daily.       Neurological Re-education Exercises   Other Exercises 1 Addressed biomechanics of low to mid reach left arm. Patient with improved active anterior deltoid, with initial stage of reach, but quickly fatigues than reverts to shoulder abduction, internal rotation.  Patient improving motor pattern for reach.     Other  Exercises 2 Reviewed shoulder HEP.  Practiced isolated elbow flexion extension/ supine with 1lb dumb bell - BUE's x 10 reps.  Modified plank and 4 point to 3 point to address proximal stability / core strengthening.                  OT Education - 03/19/17 1603    Education provided Yes   Education Details Reviewed progress toward short term goals.  Reviewed remaining long term goals.     Person(s) Educated Patient;Spouse   Methods Explanation;Demonstration   Comprehension Verbalized understanding;Returned demonstration          OT Short Term Goals - 03/19/17 1457      OT SHORT TERM GOAL #1   Title Patient will complete a home activity program daily with min prompting or set up --check STGs 03/27/17   Status Achieved     OT  SHORT TERM GOAL #2   Title Patient will complete a home exercise program designed to improve range of motion in BUE's with no more than set up assistance   Status Achieved     OT SHORT TERM GOAL #3   Title Patient will utilize bilateral upper extremities to cut a piece of meat during a meal with modified independence   Status Achieved     OT SHORT TERM GOAL #4   Title Patient will don compression hose with increased time and without physical assistance   Status Achieved     OT SHORT TERM GOAL #5   Title Patient will demonstrate sufficient activity tolerance to complete light housekeeping task in standing for greater than 5 minutes.     Status Achieved     OT SHORT TERM GOAL #6   Title Patient will demonstrate effective mid level reach to retrieve lightweight item less than 2 lbs from chest height shelf in standing using BUE's   Status Achieved           OT Long Term Goals - 03/19/17 1506      OT LONG TERM GOAL #3   Title (P)  Patient will complete laundry task- including transporting items to washer, and transferring to dryer, folding and putting away clothing with intermittent assistance only   Status (P)  Achieved               Plan - 03/19/17 1604    Clinical Impression Statement Patient has made excellent progress thus far, and will decrease OT to 2x/week for remaider of this episode.  Patient and husbad in agreement.     Rehab Potential Good   OT Frequency 2x / week   OT Duration 4 weeks   OT Treatment/Interventions Self-care/ADL training;Electrical Stimulation;Therapeutic exercise;Neuromuscular education;Energy conservation;Manual Therapy;Functional Mobility Training;DME and/or AE instruction;Visual/perceptual remediation/compensation;Cognitive remediation/compensation;Therapeutic activities;Balance training;Patient/family education   Plan Isolated muscle control- proximal shoulders, core strengthening, mid to high reach (supine)    OT Home Exercise Plan Initiated  HEP shoulders , Patient very engaged in home activity program to return to IADL   Consulted and Agree with Plan of Care Patient;Family member/caregiver      Patient will benefit from skilled therapeutic intervention in order to improve the following deficits and impairments:  Decreased coordination, Decreased range of motion, Decreased endurance, Decreased safety awareness, Decreased balance, Decreased cognition, Decreased strength, Impaired perceived functional ability, Impaired vision/preception, Impaired UE functional use, Decreased knowledge of use of DME, Impaired tone, Pain, Impaired sensation, Improper body mechanics  Visit Diagnosis: Muscle weakness (generalized)  Unsteadiness on feet  Other disturbances of  skin sensation  Other lack of coordination  Acute pain of left shoulder    Problem List Patient Active Problem List   Diagnosis Date Noted  . Acute blood loss anemia   . Physical deconditioning   . Tracheostomy status ([place])   . Hypoxemia   . Dysphagia   . Numbness and tingling   . S/P percutaneous endoscopic gastrostomy (PEG) tube placement ([place])   . Bradycardia   . Left corneal abrasion   . Abnormal chest x-ray   . Acute respiratory failure with hypoxia ([place])   . Guillain Barr syndrome ([place])   . Atrial fibrillation with RVR ([place])   . Dysphagia, pharyngoesophageal phase   . History of ETT   . Encounter for central line placement   . Encounter for feeding tube placement   . Weakness of right lower extremity   . SOB (shortness of breath)   . Pneumonia   . Pressure injury of skin 01/16/2017  . Acute respiratory failure ([place])   . Hyponatremia with decreased serum osmolality   . Hyponatremia   . Quadriplegia and quadriparesis ([place]) 01/14/2017  . GBS (Guillain Barre syndrome) ([place]) 01/13/2017  . Weakness 01/12/2017  . Hypokalemia 01/12/2017  . Endometriosis 12/05/2013  . Chronic low back pain 08/19/2012  . HYPERLIPIDEMIA 12/10/2010  . COLONIC POLYPS, HX OF  12/13/2009  . HYPERGLYCEMIA, FASTING 11/22/2008  . Depression 02/07/2008  . ANOSMIA 02/07/2008  . HEADACHE, CHRONIC 02/07/2008  . MITRAL VALVE PROLAPSE 10/05/2007  . OSTEOPENIA 10/05/2007    Mariah Milling, OTR/L 03/19/2017, 4:07 PM  [facility] [address] [place], [place], [address] Phone: [phone]   Fax:  [phone]  Name: Joanne Morrison MRN: [id number] Date of Birth: 08/31/1951

## 2017-03-20 ENCOUNTER — Ambulatory Visit (HOSPITAL_COMMUNITY)
Admission: RE | Admit: 2017-03-20 | Discharge: 2017-03-20 | Disposition: A | Discharge status: Home / Self Care | Payer: BLUE CROSS/BLUE SHIELD | Source: Ambulatory Visit | Attending: Physical Medicine & Rehabilitation | Admitting: Physical Medicine & Rehabilitation

## 2017-03-20 ENCOUNTER — Encounter (HOSPITAL_COMMUNITY): Payer: Self-pay | Admitting: Interventional Radiology

## 2017-03-20 DIAGNOSIS — G610 Guillain-Barre syndrome: Secondary | ICD-10-CM | POA: Insufficient documentation

## 2017-03-20 DIAGNOSIS — Z431 Encounter for attention to gastrostomy: Secondary | ICD-10-CM | POA: Diagnosis not present

## 2017-03-20 DIAGNOSIS — R1312 Dysphagia, oropharyngeal phase: Secondary | ICD-10-CM

## 2017-03-20 HISTORY — PX: IR GASTROSTOMY TUBE REMOVAL: IMG5492

## 2017-03-20 MED ORDER — LIDOCAINE VISCOUS 2 % MT SOLN
OROMUCOSAL | Status: DC | PRN
  Administered 2017-03-20: 15 mL via OROMUCOSAL

## 2017-03-20 MED ORDER — LIDOCAINE VISCOUS 2 % MT SOLN
OROMUCOSAL | Status: AC
  Filled 2017-03-20: qty 15

## 2017-03-20 NOTE — Procedures (Signed)
guillian barre syndrome  Successful Gtube removal  No comp EBL 0 Full report in PACS

## 2017-03-23 ENCOUNTER — Ambulatory Visit: Payer: BLUE CROSS/BLUE SHIELD | Admitting: Physical Therapy

## 2017-03-23 ENCOUNTER — Encounter: Payer: Self-pay | Admitting: Physical Therapy

## 2017-03-23 ENCOUNTER — Ambulatory Visit: Payer: BLUE CROSS/BLUE SHIELD | Admitting: Occupational Therapy

## 2017-03-23 ENCOUNTER — Encounter: Payer: Self-pay | Admitting: Neurology

## 2017-03-23 ENCOUNTER — Ambulatory Visit (HOSPITAL_COMMUNITY): Payer: BLUE CROSS/BLUE SHIELD

## 2017-03-23 ENCOUNTER — Encounter: Payer: BLUE CROSS/BLUE SHIELD | Admitting: Speech Pathology

## 2017-03-23 ENCOUNTER — Ambulatory Visit (INDEPENDENT_AMBULATORY_CARE_PROVIDER_SITE_OTHER): Payer: BLUE CROSS/BLUE SHIELD | Admitting: Neurology

## 2017-03-23 VITALS — BP 133/90 | HR 75 | Ht 62.0 in | Wt 138.5 lb

## 2017-03-23 DIAGNOSIS — M25512 Pain in left shoulder: Secondary | ICD-10-CM

## 2017-03-23 DIAGNOSIS — M6281 Muscle weakness (generalized): Secondary | ICD-10-CM

## 2017-03-23 DIAGNOSIS — G610 Guillain-Barre syndrome: Secondary | ICD-10-CM | POA: Diagnosis not present

## 2017-03-23 DIAGNOSIS — R2681 Unsteadiness on feet: Secondary | ICD-10-CM

## 2017-03-23 DIAGNOSIS — R2689 Other abnormalities of gait and mobility: Secondary | ICD-10-CM

## 2017-03-23 DIAGNOSIS — R278 Other lack of coordination: Secondary | ICD-10-CM

## 2017-03-23 DIAGNOSIS — R208 Other disturbances of skin sensation: Secondary | ICD-10-CM

## 2017-03-23 NOTE — Therapy (Signed)
[facility] [address] [place], [place], [address] Phone: [phone]   Fax:  [phone]  Physical Therapy Treatment  Patient Details  Name: Joanne Morrison MRN: [id number] Date of Birth: 03-25-1951 Referring Provider: Dr. Alger Simons  Encounter Date: 03/23/2017      PT End of Session - 03/23/17 1100    Visit Number 8   Number of Visits 16   Date for PT Re-Evaluation 04/25/17   Authorization Type BCBS   Authorization - Visit Number 7   Authorization - Number of Visits 20   PT Start Time 1017   PT Stop Time 1055   PT Time Calculation (min) 38 min   Behavior During Therapy [facility] for tasks assessed/performed      Past Medical History:  Diagnosis Date  . Anxiety   . Arthritis    back  . Chronic cystitis   . Chronic low back pain   . Depression   . Diverticulosis of colon   . Hemorrhoids   . History of colon polyps    09-24-2009  rectal hyperplastic polyp/   and 2016 non-cancerous  . History of endometriosis   . History of panic attacks   . Hyperlipidemia   . Osteopenia   . Pelvic pain   . Sensation of pressure in bladder area   . Wears glasses     Past Surgical History:  Procedure Laterality Date  . BUNIONECTOMY Left 2004 approx  . CARPAL TUNNEL RELEASE Bilateral right 2014;  left 2007  . COLONOSCOPY W/ POLYPECTOMY  09/24/2009  . CYSTO WITH HYDRODISTENSION [name] 10/15/2016   Procedure: CYSTOSCOPY/HYDRODISTENSION AND MARCAINE INSTILLATION;  Surgeon: Rana Snare, MD;  Location: [facility];  Service: Urology;  Laterality: [name];  . CYSTO/  HYDRODISTENTION/  INSTILLATION THERAPY  1997 approx  . HYSTEROSCOPY W/D&C  11/01/2004   POLYPECTOMY  . IR GASTROSTOMY TUBE REMOVAL  03/20/2017  . IR GENERIC HISTORICAL  01/26/2017   IR GASTROSTOMY TUBE MOD SED 01/26/2017 Sandi Mariscal, MD [facility]-INTER[facility] RAD  . LAPAROSCOPIC VAGINAL HYSTERECTOMY WITH SALPINGO OOPHORECTOMY Bilateral 04/20/2012  . TUBAL LIGATION   yrs ago    There were no vitals filed for this visit.      Subjective Assessment - 03/23/17 1018    Subjective Pt reports doing HEP at home.  Pt does "real good" with walking without the walker during the middle of the day but using it in the morning and at night when more "wobbly/tired".   Currently in Pain? No/denies                         [facility] Adult PT Treatment/Exercise - 03/23/17 0001      Ambulation/Gait   Ambulation/Gait Yes   Ambulation/Gait Assistance 5: Supervision   Ambulation/Gait Assistance Details Working on balance with dynamic gait: head turns, changes in speed and direction   Ambulation Distance (Feet) 600 Feet   Assistive device None   Gait Pattern Step-through pattern;Decreased arm swing - right;Decreased arm swing - left;Wide base of support   Ambulation Surface Level;Outdoor             Balance Exercises - 03/23/17 1032      Balance Exercises: Standing   SLS Eyes open;Intermittent upper extremity support;30 secs;4 reps  one foot on small ball   Toe Raise Limitations heel walk x2 requiring intermittent UE support.   Other Standing Exercises Quadruped- side lying to all fours, 5x each side; all fours to tall  1/2 kneel switching each side 5 x each, only 1 LOB x1. at supervision level.             PT Short Term Goals - 02/24/17 1519      PT SHORT TERM GOAL #1   Title Pt will ambulate household distances modified independently without use of RW.  03-27-17   Time 4   Period Weeks   Status New     PT SHORT TERM GOAL #2   Title Amb. 500' with RW with SBA for increased community accessibility.  03-27-17   Time 4   Period Weeks   Status New     PT SHORT TERM GOAL #3   Title Improve TUG score to </= 13.5 secs for decreased fall risk.  03-27-17   Baseline 15.22 secs without RW   Time 4   Period Weeks   Status New     PT SHORT TERM GOAL #4   Title Increase gait velocity to >/= 2.6 ft/sec without device for incr. gait  efficiency.  03-27-17   Time 4   Period Weeks   Status New     PT SHORT TERM GOAL #5   Title Increase Berg score by at least 5 points to reduce fall risk.  03-27-17   Baseline TBA next session   Time 4   Period Weeks   Status New     Additional Short Term Goals   Additional Short Term Goals Yes     PT SHORT TERM GOAL #6   Title Independent in HEP for lower extremity strengthening and balance exercises.  03-27-17   Time 4   Period Weeks   Status New           PT Long Term Goals - 02/24/17 1524      PT LONG TERM GOAL #1   Title Modified independent ambulation 1000' on even surfaces without device.  04-26-17   Time 8   Period Weeks   Status New     PT LONG TERM GOAL #2   Title Increase Berg score by at least 10 points for improved balance and for reduced fall risk.  04-26-17   Time 8   Period Weeks   Status New     PT LONG TERM GOAL #3   Title Improve TUG score to </= 11.5 secs without device to demo improved functional mobility.  04-26-17   Baseline 17.38 secs with RW:  15.22 secs without RW   Time 8   Period Weeks   Status New     PT LONG TERM GOAL #4   Title Negotiate steps using step over step sequence without rail modified independently.  04-26-17   Baseline SBA with use of 2 rails step by step   Time 8   Period Weeks   Status New     PT LONG TERM GOAL #5   Title Independent in updated HEP for strengthening and balance.  04-26-17   Time 8   Period Weeks   Status New               Plan - 03/23/17 1022    Clinical Impression Statement Pt was challenged with heel walk and SLS stance activities requiring  intermittent UE support.  Quadruped to 1/2 tall kneel without UE support was challenging; pt performed at supervision level with LOB x1.   PT Frequency 2x / week   PT Duration 8 weeks   PT Treatment/Interventions ADLs/Self Care Home Management;Gait training;Stair training;Functional mobility training;Therapeutic  activities;Therapeutic exercise;Balance  training;Neuromuscular re-education;Patient/family education   PT Next Visit Plan cont. core and LE strengthening and balance/gait training; work on floor transfers (pt request) , quadruped activities.   Consulted and Agree with Plan of Care Patient;Family member/caregiver   Family Member Consulted spouse [place]      Patient will benefit from skilled therapeutic intervention in order to improve the following deficits and impairments:  Abnormal gait, Decreased endurance, Decreased activity tolerance, Decreased balance, Decreased mobility, Decreased strength, Impaired vision/preception, Pain, Impaired sensation  Visit Diagnosis: Muscle weakness (generalized)  Unsteadiness on feet  Other abnormalities of gait and mobility     Problem List Patient Active Problem List   Diagnosis Date Noted  . Acute blood loss anemia   . Physical deconditioning   . Tracheostomy status ([place])   . Hypoxemia   . Dysphagia   . Numbness and tingling   . S/P percutaneous endoscopic gastrostomy (PEG) tube placement ([place])   . Bradycardia   . Left corneal abrasion   . Abnormal chest x-ray   . Acute respiratory failure with hypoxia ([place])   . Guillain Barr syndrome ([place])   . Atrial fibrillation with RVR ([place])   . Dysphagia, pharyngoesophageal phase   . History of ETT   . Encounter for central line placement   . Encounter for feeding tube placement   . Weakness of right lower extremity   . SOB (shortness of breath)   . Pneumonia   . Pressure injury of skin 01/16/2017  . Acute respiratory failure ([place])   . Hyponatremia with decreased serum osmolality   . Hyponatremia   . Quadriplegia and quadriparesis ([place]) 01/14/2017  . GBS (Guillain Barre syndrome) ([place]) 01/13/2017  . Weakness 01/12/2017  . Hypokalemia 01/12/2017  . Endometriosis 12/05/2013  . Chronic low back pain 08/19/2012  . HYPERLIPIDEMIA 12/10/2010  . COLONIC POLYPS, HX OF 12/13/2009  . HYPERGLYCEMIA, FASTING 11/22/2008  . Depression  02/07/2008  . ANOSMIA 02/07/2008  . HEADACHE, CHRONIC 02/07/2008  . MITRAL VALVE PROLAPSE 10/05/2007  . OSTEOPENIA 10/05/2007   Bjorn Loser, PTA  03/23/17, 11:25 AM [facility] [address] [place], [place], [address] Phone: [phone]   Fax:  [phone]  Name: Edwardine Deschepper MRN: [id number] Date of Birth: [birth year]

## 2017-03-23 NOTE — Therapy (Signed)
[facility] [address] [place], [place], [address] Phone: [phone]   Fax:  [phone]  Occupational Therapy Treatment  Patient Details  Name: Joanne Morrison MRN: [id number] Date of Birth: [birth year] Referring Provider: Dr Naaman Plummer  Encounter Date: 03/23/2017      OT End of Session - 03/23/17 0930    Visit Number 12   Number of Visits 20   Date for OT Re-Evaluation 04/25/17   Authorization Type [employer] 20 VISIT limit - no authorization required   Authorization - Visit Number 12   Authorization - Number of Visits 20   OT Start Time 0932   OT Stop Time 1015   OT Time Calculation (min) 43 min   Activity Tolerance Patient tolerated treatment well   Behavior During Therapy [facility] for tasks assessed/performed      Past Medical History:  Diagnosis Date  . Anxiety   . Arthritis    back  . Chronic cystitis   . Chronic low back pain   . Depression   . Diverticulosis of colon   . Hemorrhoids   . History of colon polyps    09-24-2009  rectal hyperplastic polyp/   and 2016 non-cancerous  . History of endometriosis   . History of panic attacks   . Hyperlipidemia   . Osteopenia   . Pelvic pain   . Sensation of pressure in bladder area   . Wears glasses     Past Surgical History:  Procedure Laterality Date  . BUNIONECTOMY Left 2004 approx  . CARPAL TUNNEL RELEASE Bilateral right 2014;  left 2007  . COLONOSCOPY W/ POLYPECTOMY  09/24/2009  . CYSTO WITH HYDRODISTENSION [name] 10/15/2016   Procedure: CYSTOSCOPY/HYDRODISTENSION AND MARCAINE INSTILLATION;  Surgeon: Rana Snare, MD;  Location: [facility];  Service: Urology;  Laterality: [name];  . CYSTO/  HYDRODISTENTION/  INSTILLATION THERAPY  1997 approx  . HYSTEROSCOPY W/D&C  11/01/2004   POLYPECTOMY  . IR GASTROSTOMY TUBE REMOVAL  03/20/2017  . IR GENERIC HISTORICAL  01/26/2017   IR GASTROSTOMY TUBE MOD SED 01/26/2017 Sandi Mariscal, MD [facility]INTER[facility]  .  LAPAROSCOPIC VAGINAL HYSTERECTOMY WITH SALPINGO OOPHORECTOMY Bilateral 04/20/2012  . TUBAL LIGATION  yrs ago    There were no vitals filed for this visit.      Subjective Assessment - 03/23/17 0932    Subjective  I got on the floor and then stood up.  I'm weak because we did a lot of exercises this weekend   Patient is accompained by: Family member   Pertinent History hx of back pain   Currently in Pain? No/denies           In supine, AAROM shoulder flex and chest press with BUEs with ball for AAROM with min facilitation/cues for LUE and min cues for RUE.  Scapular retraction with chest lift in supine, followed by scapular depression.  L ER AROM with min facilitation.  Functional reaching across body and laterally with each UE with min-mod cueing/facilitation for normal movement patterns (incorporating trunk rotation).  AAROM RUE in along hemi-glide for mid-high range shoulder flex with min-mod cueing/facilitation.  In sidelying, L shoulder ER with min facilitation/cues.  In prone, bilateral scapular ex with shoulders in extension with min cueing/facilitation for compensation at times.  Wt. Bearing on elbows for scapular depression with chest lift with min-mod cues.    In standing, mid range functional reaching with min-mod cues/facilitation for normal movement patterns with each UE incorporating wt.shift and trunk rotation with  1 LOB with min A to recover.  In quadraped, cat/cow positions for incr scapular gliding/stability with min facilitation/cues.                    OT Short Term Goals - 03/19/17 1457      OT SHORT TERM GOAL #1   Title Patient will complete a home activity program daily with min prompting or set up --check STGs 03/27/17   Status Achieved     OT SHORT TERM GOAL #2   Title Patient will complete a home exercise program designed to improve range of motion in BUE's with no more than set up assistance   Status Achieved     OT SHORT TERM GOAL #3    Title Patient will utilize bilateral upper extremities to cut a piece of meat during a meal with modified independence   Status Achieved     OT SHORT TERM GOAL #4   Title Patient will don compression hose with increased time and without physical assistance   Status Achieved     OT SHORT TERM GOAL #5   Title Patient will demonstrate sufficient activity tolerance to complete light housekeeping task in standing for greater than 5 minutes.     Status Achieved     OT SHORT TERM GOAL #6   Title Patient will demonstrate effective mid level reach to retrieve lightweight item less than 2 lbs from chest height shelf in standing using BUE's   Status Achieved           OT Long Term Goals - 03/23/17 1307      OT LONG TERM GOAL #1   Title Patient will complete an updated home exercise program designed for UE strengthening daily--check LTGs 04/26/17   Time 8   Period Weeks   Status On-going     OT LONG TERM GOAL #2   Title Patient will prepare simple hot meal for she and her husband with no rest breaks- 30 min of activity   Time 8   Period Weeks   Status On-going     OT LONG TERM GOAL #3   Title Patient will complete laundry task- including transporting items to washer, and transferring to dryer, folding and putting away clothing with intermittent assistance only   Status Achieved     OT LONG TERM GOAL #4   Title Patient will utilize bilateral upper extremities to blow dry and style her hair with modified independence   Time 8   Period Weeks   Status On-going     OT LONG TERM GOAL #5   Title Patient will demonstrate sufficient UE/LE strength and balance to reach to floor to retrieve a lightweight (no more than 2 lb) object without loss of balance   Time 8   Period Weeks   Status On-going     OT LONG TERM GOAL #6   Title Patient will demonstrate sufficient strength for unilateral reach to retrieve a 3 lb object from overhead shelf while standing with right hand and left hand (not  bilateral reach)   Time 8   Period Weeks   Status New     OT LONG TERM GOAL #7   Title Patient will demonstrate sufficient strength to get down to floor to clean tub, and get back up without assistance   Time 8   Period Weeks   Status New               Plan - 03/23/17 1306  Clinical Impression Statement Pt is continuing to progress towards goals with improving UE control and activity tolerance.     Rehab Potential Good   OT Frequency 2x / week   OT Duration 4 weeks   OT Treatment/Interventions Self-care/ADL training;Electrical Stimulation;Therapeutic exercise;Neuromuscular education;Energy conservation;Manual Therapy;Functional Mobility Training;DME and/or AE instruction;Visual/perceptual remediation/compensation;Cognitive remediation/compensation;Therapeutic activities;Balance training;Patient/family education   Plan continue with UE functional use, proximal UE and core strengthening/stability   OT Home Exercise Plan Initiated HEP shoulders , Patient very engaged in home activity program to return to IADL   Consulted and Agree with Plan of Care Patient;Family member/caregiver      Patient will benefit from skilled therapeutic intervention in order to improve the following deficits and impairments:  Decreased coordination, Decreased range of motion, Decreased endurance, Decreased safety awareness, Decreased balance, Decreased cognition, Decreased strength, Impaired perceived functional ability, Impaired vision/preception, Impaired UE functional use, Decreased knowledge of use of DME, Impaired tone, Pain, Impaired sensation, Improper body mechanics  Visit Diagnosis: Muscle weakness (generalized)  Unsteadiness on feet  Other disturbances of skin sensation  Other lack of coordination  Acute pain of left shoulder    Problem List Patient Active Problem List   Diagnosis Date Noted  . Acute blood loss anemia   . Physical deconditioning   . Tracheostomy status ([place])   .  Hypoxemia   . Dysphagia   . Numbness and tingling   . S/P percutaneous endoscopic gastrostomy (PEG) tube placement ([place])   . Bradycardia   . Left corneal abrasion   . Abnormal chest x-ray   . Acute respiratory failure with hypoxia ([place])   . Guillain Barr syndrome ([place])   . Atrial fibrillation with RVR ([place])   . Dysphagia, pharyngoesophageal phase   . History of ETT   . Encounter for central line placement   . Encounter for feeding tube placement   . Weakness of right lower extremity   . SOB (shortness of breath)   . Pneumonia   . Pressure injury of skin 01/16/2017  . Acute respiratory failure ([place])   . Hyponatremia with decreased serum osmolality   . Hyponatremia   . Quadriplegia and quadriparesis ([place]) 01/14/2017  . GBS (Guillain Barre syndrome) ([place]) 01/13/2017  . Weakness 01/12/2017  . Hypokalemia 01/12/2017  . Endometriosis 12/05/2013  . Chronic low back pain 08/19/2012  . HYPERLIPIDEMIA 12/10/2010  . COLONIC POLYPS, HX OF 12/13/2009  . HYPERGLYCEMIA, FASTING 11/22/2008  . Depression 02/07/2008  . ANOSMIA 02/07/2008  . HEADACHE, CHRONIC 02/07/2008  . MITRAL VALVE PROLAPSE 10/05/2007  . OSTEOPENIA 10/05/2007    [facility] 03/23/2017, 1:08 PM  [facility] [address] [place], [place], [address] Phone: [phone]   Fax:  [phone]  Name: Michel Eskelson MRN: [id number] Date of Birth: 11-19-1951   Vianne Bulls, OTR/L [facility] [address] [place], [place]  [phone] phone [phone] 03/23/17 1:09 PM

## 2017-03-23 NOTE — Patient Instructions (Signed)
Strengthening: Hip Extension - Resisted    With tubing around right ankle, face anchor and pull leg straight back. Repeat _10___ times per set. Do _3___ sets per session. Do __1-2__ sessions per day.  http://orth.exer.us/637   Copyright  VHI. All rights reserved.

## 2017-03-23 NOTE — Progress Notes (Signed)
Reason for visit: Guillain-Barr syndrome  Referring physician: [facility]  Joanne Morrison is a 66 y.o. female  History of present illness:  Joanne Morrison is a 66 year old white female with a history of onset of weakness of the legs that began relatively suddenly around 12/31/2016. The patient claims that she was doing housework earlier that day but then suddenly noted that her legs will not support her, and she collapsed. The patient eventually went to the hospital and underwent MRI evaluation of the brain, cervical spine, thoracic spine, and lumbar spine which were relatively unremarkable. The patient underwent a lumbar puncture, was noted to have slightly elevated spinal fluid protein of 79, without elevation in the white blood count. The patient was given IVIG for treatment of Guillain-Barr syndrome, she received a seven-day course. The patient was in the intensive care unit as she required intubation and eventually required tracheotomy. The patient developed a left facial weakness, and weakness of all 4 extremities. The patient has had some numbness in the toes. She did initially have some trouble controlling the bowels and the bladder. This issue has improved. The patient has had some discomfort, she has required a fentanyl patch, but this is being tapered off rapidly. Since being out of the hospital, the patient is getting outpatient therapy, she has made a very rapid recovery, she is fully ambulatory. The patient uses a walker, but she can walk independently. She is able to control the bowels and bladder at this time. She has some minimal tingling in the toes. She continues to have prominent left facial weakness. She is sent to this office for an evaluation.  Past Medical History:  Diagnosis Date  . Anxiety   . Arthritis    back  . Chronic cystitis   . Chronic low back pain   . Depression   . Diverticulosis of colon   . Hemorrhoids   . History of colon polyps    09-24-2009  rectal  hyperplastic polyp/   and 2016 non-cancerous  . History of endometriosis   . History of panic attacks   . Hyperlipidemia   . Osteopenia   . Pelvic pain   . Sensation of pressure in bladder area   . Wears glasses     Past Surgical History:  Procedure Laterality Date  . BUNIONECTOMY Left 2004 approx  . CARPAL TUNNEL RELEASE Bilateral right 2014;  left 2007  . COLONOSCOPY W/ POLYPECTOMY  09/24/2009  . CYSTO WITH HYDRODISTENSION [name] 10/15/2016   Procedure: CYSTOSCOPY/HYDRODISTENSION AND MARCAINE INSTILLATION;  Surgeon: Rana Snare, MD;  Location: [facility];  Service: Urology;  Laterality: [name];  . CYSTO/  HYDRODISTENTION/  INSTILLATION THERAPY  1997 approx  . HYSTEROSCOPY W/D&C  11/01/2004   POLYPECTOMY  . IR GASTROSTOMY TUBE REMOVAL  03/20/2017  . IR GENERIC HISTORICAL  01/26/2017   IR GASTROSTOMY TUBE MOD SED 01/26/2017 Sandi Mariscal, MD [facility]  . LAPAROSCOPIC VAGINAL HYSTERECTOMY WITH SALPINGO OOPHORECTOMY Bilateral 04/20/2012  . TUBAL LIGATION  yrs ago    Family History  Problem Relation Age of Onset  . Hypertension Mother   . Dementia Mother   . Lung cancer Mother     smoker  . Osteoporosis Mother   . Heart attack Father 52  . Hypertension Brother   . Pulmonary embolism Brother 60    ?etilology  . Diabetes Maternal Aunt   . Cancer Maternal Uncle     RENAL  . Heart attack Maternal Grandmother 86  . Lung cancer Maternal  Grandfather     Ecologist  . Anesthesia problems Neg Hx   . Stroke Neg Hx     Social history:  reports that she has never smoked. She has never used smokeless tobacco. She reports that she does not drink alcohol or use drugs.  Medications:  Prior to Admission medications   Medication Sig Start Date End Date Taking? Authorizing Provider  diazepam (VALIUM) 10 MG tablet Take 10 mg by mouth 2 (two) times daily. 12/16/16  Yes Historical Provider, MD  erythromycin ophthalmic ointment Place 1 application into both eyes every 4 (four) hours.  02/20/17  Yes Daniel J Angiulli, PA-C  fentaNYL (DURAGESIC - DOSED MCG/HR) 12 MCG/HR Place 1 patch (12.5 mcg total) onto the skin every 3 (three) days. 03/04/17  Yes Meredith Staggers, MD  sertraline (ZOLOFT) 50 MG tablet Take 1 tablet (50 mg total) by mouth at bedtime. 02/20/17  Yes Daniel J Angiulli, PA-C      Allergies  Allergen Reactions  . Olanzapine Other (See Comments)    STIFF JOINTS, EXTREMITY WEAKNESS, MEMORY LOSS, LOSS OF BALANCE  . Gabapentin     Causes confusion and extreme drowsiness   . Sumatriptan Other (See Comments)    REACTION: altered mental status  . Graciella Freer D-S] Other (See Comments)    lethargic    ROS:  Out of a complete 14 system review of symptoms, the patient complains only of the following symptoms, and all other reviewed systems are negative.  Weight loss, fatigue Itching Feeling hot, cold, increased thirst, flushing Achy muscles Depression, anxiety, decreased energy, change in appetite  Blood pressure 133/90, pulse 75, height 5\' 2"  (1.575 m), weight 138 lb 8 oz (62.8 kg).  Physical Exam  General: The patient is alert and cooperative at the time of the examination.  Eyes: Pupils are equal, round, and reactive to light. Discs are flat bilaterally.  Neck: The neck is supple, no carotid bruits are noted.  Respiratory: The respiratory examination is clear.  Cardiovascular: The cardiovascular examination reveals a regular rate and rhythm, no obvious murmurs or rubs are noted.  Skin: Extremities are without significant edema.  Neurologic Exam  Mental status: The patient is alert and oriented x 3 at the time of the examination. The patient has apparent normal recent and remote memory, with an apparently normal attention span and concentration ability.  Cranial nerves: Facial symmetry is not present. There is good sensation of the face to pinprick and soft touch bilaterally. The strength of the facial muscles and the muscles to head turning and  shoulder shrug are normal bilaterally, with exception that there is prominent peripheral facial weakness on the left. Speech is well enunciated, no aphasia or dysarthria is noted. Extraocular movements are full. Visual fields are full. The tongue is midline, and the patient has symmetric elevation of the soft palate. No obvious hearing deficits are noted.  Motor: The motor testing reveals 5 over 5 strength of all 4 extremities, with exception of 4/5 strength with the triceps muscles bilaterally and the left deltoid muscle. Good symmetric motor tone is noted throughout.  Sensory: Sensory testing is intact to pinprick, soft touch, vibration sensation, and position sense on all 4 extremities. No evidence of extinction is noted.  Coordination: Cerebellar testing reveals good finger-nose-finger and heel-to-shin bilaterally.  Gait and station: Gait is minimally wide-based, the patient can walk independently. Tandem gait is unsteady. Romberg is negative. No drift is seen.  Reflexes: Deep tendon reflexes are symmetric, but are depressed  to absent bilaterally. Toes are downgoing bilaterally.   Assessment/Plan:  1. Guillain-Barr syndrome  The patient appears to have had a rapid onset of weakness with the legs initially following a strep throat infection and pneumonia. The patient has consequently made a very rapid recovery, she is now fully ambulatory. The patient has a persistent left peripheral facial weakness. The patient will be sent for some further blood work today, she will continue physical therapy, she will follow-up through this office in 3 or 4 months.  Jill Alexanders MD 03/23/2017 2:33 PM  [facility] [address] [place], [place] [address]  Phone [phone] Fax [phone]

## 2017-03-25 ENCOUNTER — Ambulatory Visit: Payer: BLUE CROSS/BLUE SHIELD | Admitting: Physical Therapy

## 2017-03-25 ENCOUNTER — Telehealth: Payer: Self-pay | Admitting: Neurology

## 2017-03-25 ENCOUNTER — Encounter: Payer: BLUE CROSS/BLUE SHIELD | Admitting: Occupational Therapy

## 2017-03-25 LAB — ANGIOTENSIN CONVERTING ENZYME: ANGIO CONVERT ENZYME: 52 U/L (ref 14–82)

## 2017-03-25 LAB — MULTIPLE MYELOMA PANEL, SERUM
ALBUMIN SERPL ELPH-MCNC: 3.8 g/dL (ref 2.9–4.4)
ALBUMIN/GLOB SERPL: 1.3 (ref 0.7–1.7)
Alpha 1: 0.3 g/dL (ref 0.0–0.4)
Alpha2 Glob SerPl Elph-Mcnc: 0.7 g/dL (ref 0.4–1.0)
B-GLOBULIN SERPL ELPH-MCNC: 0.9 g/dL (ref 0.7–1.3)
GAMMA GLOB SERPL ELPH-MCNC: 1.2 g/dL (ref 0.4–1.8)
GLOBULIN, TOTAL: 3.1 g/dL (ref 2.2–3.9)
IGA/IMMUNOGLOBULIN A, SERUM: 173 mg/dL (ref 87–352)
IgG (Immunoglobin G), Serum: 1145 mg/dL (ref 700–1600)
IgM (Immunoglobulin M), Srm: 101 mg/dL (ref 26–217)
Total Protein: 6.9 g/dL (ref 6.0–8.5)

## 2017-03-25 LAB — PAN-ANCA
ANCA Proteinase 3: 7.7 U/mL — ABNORMAL HIGH (ref 0.0–3.5)
Atypical pANCA: <1:20 {titer}
C-ANCA: <1:20 {titer}

## 2017-03-25 LAB — ANA W/REFLEX: ANA: NEGATIVE

## 2017-03-25 LAB — B. BURGDORFI ANTIBODIES

## 2017-03-25 LAB — HIV ANTIBODY (ROUTINE TESTING W REFLEX): HIV Screen 4th Generation wRfx: NONREACTIVE

## 2017-03-25 LAB — SEDIMENTATION RATE: SED RATE: 7 mm/h (ref 0–40)

## 2017-03-25 NOTE — Telephone Encounter (Signed)
I called patient. The blood work was unremarkable with exception that the ANCA proteinase-3 antibody level was elevated. This is usually associated with Wegener's granulomatosis with active disease, but the patient has no symptoms of this, she has no problems with sinus disease, pulmonary issues, or renal disease. I suspect this may be a false positive, although the false positive rate with this test is usually fairly low, less than 2%.

## 2017-03-26 ENCOUNTER — Encounter: Payer: Self-pay | Admitting: Physical Therapy

## 2017-03-26 ENCOUNTER — Ambulatory Visit: Payer: BLUE CROSS/BLUE SHIELD | Admitting: Occupational Therapy

## 2017-03-26 ENCOUNTER — Ambulatory Visit: Payer: BLUE CROSS/BLUE SHIELD | Admitting: Physical Therapy

## 2017-03-26 DIAGNOSIS — M6281 Muscle weakness (generalized): Secondary | ICD-10-CM

## 2017-03-26 DIAGNOSIS — R2689 Other abnormalities of gait and mobility: Secondary | ICD-10-CM

## 2017-03-26 DIAGNOSIS — R2681 Unsteadiness on feet: Secondary | ICD-10-CM

## 2017-03-26 DIAGNOSIS — R208 Other disturbances of skin sensation: Secondary | ICD-10-CM

## 2017-03-26 DIAGNOSIS — R278 Other lack of coordination: Secondary | ICD-10-CM

## 2017-03-26 DIAGNOSIS — M25512 Pain in left shoulder: Secondary | ICD-10-CM

## 2017-03-26 NOTE — Therapy (Signed)
[facility] [address] [place], [place], [address] Phone: [phone]   Fax:  [phone]  Occupational Therapy Treatment  Patient Details  Name: Joanne Morrison MRN: [id number] Date of Birth: May 13, 1951 Referring Provider: Dr Naaman Plummer  Encounter Date: 03/26/2017      OT End of Session - 03/26/17 0845    Visit Number 13   Number of Visits 20   Date for OT Re-Evaluation 04/25/17   Authorization Type [employer] 20 VISIT limit - no authorization required   Authorization - Visit Number 13   Authorization - Number of Visits 20   OT Start Time 0849   OT Stop Time 0930   OT Time Calculation (min) 41 min   Activity Tolerance Patient tolerated treatment well   Behavior During Therapy [facility] for tasks assessed/performed      Past Medical History:  Diagnosis Date  . Anxiety   . Arthritis    back  . Chronic cystitis   . Chronic low back pain   . Depression   . Diverticulosis of colon   . Hemorrhoids   . History of colon polyps    09-24-2009  rectal hyperplastic polyp/   and 2016 non-cancerous  . History of endometriosis   . History of panic attacks   . Hyperlipidemia   . Osteopenia   . Pelvic pain   . Sensation of pressure in bladder area   . Wears glasses     Past Surgical History:  Procedure Laterality Date  . BUNIONECTOMY Left 2004 approx  . CARPAL TUNNEL RELEASE Bilateral right 2014;  left 2007  . COLONOSCOPY W/ POLYPECTOMY  09/24/2009  . CYSTO WITH HYDRODISTENSION [name] 10/15/2016   Procedure: CYSTOSCOPY/HYDRODISTENSION AND MARCAINE INSTILLATION;  Surgeon: Rana Snare, MD;  Location: [facility];  Service: Urology;  Laterality: [name];  . CYSTO/  HYDRODISTENTION/  INSTILLATION THERAPY  1997 approx  . HYSTEROSCOPY W/D&C  11/01/2004   POLYPECTOMY  . IR GASTROSTOMY TUBE REMOVAL  03/20/2017  . IR GENERIC HISTORICAL  01/26/2017   IR GASTROSTOMY TUBE MOD SED 01/26/2017 Sandi Mariscal, MD [facility]  .  LAPAROSCOPIC VAGINAL HYSTERECTOMY WITH SALPINGO OOPHORECTOMY Bilateral 04/20/2012  . TUBAL LIGATION  yrs ago    There were no vitals filed for this visit.      Subjective Assessment - 03/26/17 0845    Subjective  Pt reports that her L forearm feels weak   Patient is accompained by: Family member   Pertinent History hx of back pain (went to pain management center)   Currently in Pain? No/denies        In supine, AAROM shoulder flex and chest press with BUEs with ball for AAROM with min facilitation/cues for LUE and min cues for RUE.    In prone, bilateral scapular ex with shoulders in ext with min cueing/facilitation.    Wt. Bearing on elbows in prone for scapular depression with chest lift with min-mod cues.  Modified prone on knees attempted, but discontinued due to back discomfort.  In sitting, with shoulders positioned in ER, scapular retraction/depression with with min facilitation/cues.                      OT Education - 03/26/17 1230    Education Details Added ex to HEP--see pt instructions (red putty, 1lb wt for forearm/wrist, scapular strengthening)   Person(s) Educated Patient   Methods Explanation;Demonstration;Verbal cues;Handout   Comprehension Verbalized understanding;Verbal cues required;Returned demonstration  OT Short Term Goals - 03/19/17 1457      OT SHORT TERM GOAL #1   Title Patient will complete a home activity program daily with min prompting or set up --check STGs 03/27/17   Status Achieved     OT SHORT TERM GOAL #2   Title Patient will complete a home exercise program designed to improve range of motion in BUE's with no more than set up assistance   Status Achieved     OT SHORT TERM GOAL #3   Title Patient will utilize bilateral upper extremities to cut a piece of meat during a meal with modified independence   Status Achieved     OT SHORT TERM GOAL #4   Title Patient will don compression hose with increased time and  without physical assistance   Status Achieved     OT SHORT TERM GOAL #5   Title Patient will demonstrate sufficient activity tolerance to complete light housekeeping task in standing for greater than 5 minutes.     Status Achieved     OT SHORT TERM GOAL #6   Title Patient will demonstrate effective mid level reach to retrieve lightweight item less than 2 lbs from chest height shelf in standing using BUE's   Status Achieved           OT Long Term Goals - 03/23/17 1307      OT LONG TERM GOAL #1   Title Patient will complete an updated home exercise program designed for UE strengthening daily--check LTGs 04/26/17   Time 8   Period Weeks   Status On-going     OT LONG TERM GOAL #2   Title Patient will prepare simple hot meal for she and her husband with no rest breaks- 30 min of activity   Time 8   Period Weeks   Status On-going     OT LONG TERM GOAL #3   Title Patient will complete laundry task- including transporting items to washer, and transferring to dryer, folding and putting away clothing with intermittent assistance only   Status Achieved     OT LONG TERM GOAL #4   Title Patient will utilize bilateral upper extremities to blow dry and style her hair with modified independence   Time 8   Period Weeks   Status On-going     OT LONG TERM GOAL #5   Title Patient will demonstrate sufficient UE/LE strength and balance to reach to floor to retrieve a lightweight (no more than 2 lb) object without loss of balance   Time 8   Period Weeks   Status On-going     OT LONG TERM GOAL #6   Title Patient will demonstrate sufficient strength for unilateral reach to retrieve a 3 lb object from overhead shelf while standing with right hand and left hand (not bilateral reach)   Time 8   Period Weeks   Status New     OT LONG TERM GOAL #7   Title Patient will demonstrate sufficient strength to get down to floor to clean tub, and get back up without assistance   Time 8   Period Weeks    Status New               Plan - 03/26/17 0846    Clinical Impression Statement Pt is progressing towards goals with improving UE control, decr compensation, and improved activity tolerance.   Rehab Potential Good   OT Frequency 2x / week   OT Duration 4 weeks  OT Treatment/Interventions Self-care/ADL training;Electrical Stimulation;Therapeutic exercise;Neuromuscular education;Energy conservation;Manual Therapy;Functional Mobility Training;DME and/or AE instruction;Visual/perceptual remediation/compensation;Cognitive remediation/compensation;Therapeutic activities;Balance training;Patient/family education   Plan continue with UE functional use, proximal UE and core strengthening/stability; IADLs   OT Home Exercise Plan Initiated HEP shoulders , Patient very engaged in home activity program to return to IADL   Consulted and Agree with Plan of Care Patient;Family member/caregiver      Patient will benefit from skilled therapeutic intervention in order to improve the following deficits and impairments:  Decreased coordination, Decreased range of motion, Decreased endurance, Decreased safety awareness, Decreased balance, Decreased cognition, Decreased strength, Impaired perceived functional ability, Impaired vision/preception, Impaired UE functional use, Impaired tone, Pain, Impaired sensation, Improper body mechanics  Visit Diagnosis: Muscle weakness (generalized)  Other lack of coordination  Acute pain of left shoulder  Other abnormalities of gait and mobility  Other disturbances of skin sensation  Unsteadiness on feet    Problem List Patient Active Problem List   Diagnosis Date Noted  . Acute blood loss anemia   . Physical deconditioning   . Tracheostomy status ([place])   . Hypoxemia   . Dysphagia   . Numbness and tingling   . S/P percutaneous endoscopic gastrostomy (PEG) tube placement ([place])   . Bradycardia   . Left corneal abrasion   . Abnormal chest x-ray   .  Acute respiratory failure with hypoxia ([place])   . Guillain Barr syndrome ([place])   . Atrial fibrillation with RVR ([place])   . Dysphagia, pharyngoesophageal phase   . History of ETT   . Encounter for central line placement   . Encounter for feeding tube placement   . Weakness of right lower extremity   . SOB (shortness of breath)   . Pneumonia   . Pressure injury of skin 01/16/2017  . Acute respiratory failure ([place])   . Hyponatremia with decreased serum osmolality   . Hyponatremia   . Quadriplegia and quadriparesis ([place]) 01/14/2017  . GBS (Guillain Barre syndrome) ([place]) 01/13/2017  . Weakness 01/12/2017  . Hypokalemia 01/12/2017  . Endometriosis 12/05/2013  . Chronic low back pain 08/19/2012  . HYPERLIPIDEMIA 12/10/2010  . COLONIC POLYPS, HX OF 12/13/2009  . HYPERGLYCEMIA, FASTING 11/22/2008  . Depression 02/07/2008  . ANOSMIA 02/07/2008  . HEADACHE, CHRONIC 02/07/2008  . MITRAL VALVE PROLAPSE 10/05/2007  . OSTEOPENIA 10/05/2007    [facility] 03/26/2017, 12:56 PM  [facility] [address] [place], [place], [address] Phone: [phone]   Fax:  [phone]  Name: Joanne Morrison MRN: [id number] Date of Birth: 02/01/1951   Vianne Bulls, OTR/L [facility] [address] [place], [place]  [address] [phone] phone [phone] 03/26/17 1:09 PM

## 2017-03-26 NOTE — Patient Instructions (Addendum)
OTHER: Scapular Protraction    Use shoulder muscles to lift chest off floor. Do not lift hips. Keep head in line with body, lift chest so that shoulders down. Hold 10 seconds. 10 per day    Sit with hands positioned behind you (fingers pointing back).  Squeeze shoulder blades back (not up to your ears), chest/bellybutton up, relax head and keep in line with body.  Hold 10sec, repeat 10x.     Wrist Flexion: Resisted   With right palm up, 1 pound weight in hand, bend wrist up. Return slowly. Repeat 10 times per set.  Do 1 sessions per day.  Wrist Extension: Resisted   With right palm down, 1 pound weight in hand, bend wrist up. Return slowly. Repeat 10 times per set. Do 1 sessions per day.      Forearm Supination / Pronation: Resisted    With 1 pound object in right hand, slowly turn palm up, then down.  Sit and keep elbow by your side.  Repeat 10 times per set. Do 1 sessions per day.   Wrist Activities: Extensors / Flexors    Move hand by bending at wrist: Extensors: Palm directed away. Flexors: Palm facing body. Twist putty, large sponge or washcloth using wringing motion.   1. Grip Strengthening (Resistive Putty)   Squeeze putty using thumb and all fingers. Repeat 10 times. Do 1 sessions per day.   Extension (Assistive Putty)   Roll putty back and forth, being sure to use all fingertips.  Keep elbow close to body and shoulder down. Repeat 5 times. Do 1 sessions per day.  Then pinch as below.   Palmar Pinch Strengthening (Resistive Putty)   Pinch putty between thumb and each fingertip in turn after rolling out.  Keep shoulder down.

## 2017-03-26 NOTE — Therapy (Signed)
[facility] [address] [place], [place], [address] Phone: [phone]   Fax:  [phone]  Physical Therapy Treatment  Patient Details  Name: Joanne Morrison MRN: [id number] Date of Birth: 01-23-1951 Referring Provider: Dr. Alger Simons  Encounter Date: 03/26/2017      PT End of Session - 03/26/17 1016    Visit Number 9   Number of Visits 16   Date for PT Re-Evaluation 04/25/17   Authorization Type BCBS   Authorization - Visit Number 7   Authorization - Number of Visits 20   PT Start Time [id number]   PT Stop Time 1013   PT Time Calculation (min) 39 min   Behavior During Therapy [facility] for tasks assessed/performed      Past Medical History:  Diagnosis Date  . Anxiety   . Arthritis    back  . Chronic cystitis   . Chronic low back pain   . Depression   . Diverticulosis of colon   . Hemorrhoids   . History of colon polyps    09-24-2009  rectal hyperplastic polyp/   and 2016 non-cancerous  . History of endometriosis   . History of panic attacks   . Hyperlipidemia   . Osteopenia   . Pelvic pain   . Sensation of pressure in bladder area   . Wears glasses     Past Surgical History:  Procedure Laterality Date  . BUNIONECTOMY Left 2004 approx  . CARPAL TUNNEL RELEASE Bilateral right 2014;  left 2007  . COLONOSCOPY W/ POLYPECTOMY  09/24/2009  . CYSTO WITH HYDRODISTENSION [name] 10/15/2016   Procedure: CYSTOSCOPY/HYDRODISTENSION AND MARCAINE INSTILLATION;  Surgeon: Rana Snare, MD;  Location: [facility];  Service: Urology;  Laterality: [name];  . CYSTO/  HYDRODISTENTION/  INSTILLATION THERAPY  1997 approx  . HYSTEROSCOPY W/D&C  11/01/2004   POLYPECTOMY  . IR GASTROSTOMY TUBE REMOVAL  03/20/2017  . IR GENERIC HISTORICAL  01/26/2017   IR GASTROSTOMY TUBE MOD SED 01/26/2017 Sandi Mariscal, MD [facility]  . LAPAROSCOPIC VAGINAL HYSTERECTOMY WITH SALPINGO OOPHORECTOMY Bilateral 04/20/2012  . TUBAL LIGATION   yrs ago    There were no vitals filed for this visit.      Subjective Assessment - 03/26/17 0942    Subjective Reports negotiating stairs without rails since last visit.   Currently in Pain? No/denies                         [facility] Adult PT Treatment/Exercise - 03/26/17 0001      Ambulation/Gait   Ambulation/Gait Yes   Ambulation/Gait Assistance 5: Supervision   Ambulation/Gait Assistance Details cues for visual scanning/ upright gaze   Ambulation Distance (Feet) 400 Feet   Assistive device None   Gait Pattern Step-through pattern;Decreased arm swing - right;Decreased arm swing - left;Wide base of support   Ambulation Surface Level;Outdoor   Stairs Yes   Stairs Assistance 5: Supervision;4: Restaurant manager, fast food Details (indicate cue type and reason) working on decreased UE support; 1 to no rails   Stair Management Technique One rail Right;No rails;Step to pattern;Alternating pattern             Balance Exercises - 03/26/17 0948      Balance Exercises: Standing   Standing Eyes Opened Wide (BOA);Narrow base of support (BOS);Head turns;Foam/compliant surface  Narrow BOS with diagonal head turns   Step Ups Forward;6 inch;Intermittent UE support  x10 each leading   Gait with  Head Turns Retro  with head turns   Tandem Gait Forward;Intermittent upper extremity support   Heel Raises Limitations toe walk, x4, no UE support needed.   Toe Raise Limitations heel walk x2 requiring intermittent UE support.   Sit to Stand Time balance coming to stand on compliant surface             PT Short Term Goals - 02/24/17 1519      PT SHORT TERM GOAL #1   Title Pt will ambulate household distances modified independently without use of RW.  03-27-17   Time 4   Period Weeks   Status New     PT SHORT TERM GOAL #2   Title Amb. 500' with RW with SBA for increased community accessibility.  03-27-17   Time 4   Period Weeks   Status New     PT SHORT TERM  GOAL #3   Title Improve TUG score to </= 13.5 secs for decreased fall risk.  03-27-17   Baseline 15.22 secs without RW   Time 4   Period Weeks   Status New     PT SHORT TERM GOAL #4   Title Increase gait velocity to >/= 2.6 ft/sec without device for incr. gait efficiency.  03-27-17   Time 4   Period Weeks   Status New     PT SHORT TERM GOAL #5   Title Increase Berg score by at least 5 points to reduce fall risk.  03-27-17   Baseline TBA next session   Time 4   Period Weeks   Status New     Additional Short Term Goals   Additional Short Term Goals Yes     PT SHORT TERM GOAL #6   Title Independent in HEP for lower extremity strengthening and balance exercises.  03-27-17   Time 4   Period Weeks   Status New           PT Long Term Goals - 02/24/17 1524      PT LONG TERM GOAL #1   Title Modified independent ambulation 1000' on even surfaces without device.  04-26-17   Time 8   Period Weeks   Status New     PT LONG TERM GOAL #2   Title Increase Berg score by at least 10 points for improved balance and for reduced fall risk.  04-26-17   Time 8   Period Weeks   Status New     PT LONG TERM GOAL #3   Title Improve TUG score to </= 11.5 secs without device to demo improved functional mobility.  04-26-17   Baseline 17.38 secs with RW:  15.22 secs without RW   Time 8   Period Weeks   Status New     PT LONG TERM GOAL #4   Title Negotiate steps using step over step sequence without rail modified independently.  04-26-17   Baseline SBA with use of 2 rails step by step   Time 8   Period Weeks   Status New     PT LONG TERM GOAL #5   Title Independent in updated HEP for strengthening and balance.  04-26-17   Time 8   Period Weeks   Status New               Plan - 03/26/17 1451    Clinical Impression Statement Pt performed standing balance on compliant surface; pt required intermittent UE support with narrow BOS.  Dynamic balance activites on solid surface  with narrow  BOS; required intermittent UE support.  Pt contniues to demonstrate quad weakenss  L>R with step ups.                                        PT Frequency 2x / week   PT Duration 8 weeks   PT Treatment/Interventions ADLs/Self Care Home Management;Gait training;Stair training;Functional mobility training;Therapeutic activities;Therapeutic exercise;Balance training;Neuromuscular re-education;Patient/family education   PT Next Visit Plan cont. core and LE strengthening and balance( compliant, SLS, tandem) /gait training; , quadruped activities.   Consulted and Agree with Plan of Care Patient;Family member/caregiver   Family Member Consulted spouse [place]      Patient will benefit from skilled therapeutic intervention in order to improve the following deficits and impairments:  Abnormal gait, Decreased endurance, Decreased activity tolerance, Decreased balance, Decreased mobility, Decreased strength, Impaired vision/preception, Pain, Impaired sensation  Visit Diagnosis: Muscle weakness (generalized)  Other abnormalities of gait and mobility  Unsteadiness on feet     Problem List Patient Active Problem List   Diagnosis Date Noted  . Acute blood loss anemia   . Physical deconditioning   . Tracheostomy status ([place])   . Hypoxemia   . Dysphagia   . Numbness and tingling   . S/P percutaneous endoscopic gastrostomy (PEG) tube placement ([place])   . Bradycardia   . Left corneal abrasion   . Abnormal chest x-ray   . Acute respiratory failure with hypoxia ([place])   . Guillain Barr syndrome ([place])   . Atrial fibrillation with RVR ([place])   . Dysphagia, pharyngoesophageal phase   . History of ETT   . Encounter for central line placement   . Encounter for feeding tube placement   . Weakness of right lower extremity   . SOB (shortness of breath)   . Pneumonia   . Pressure injury of skin 01/16/2017  . Acute respiratory failure ([place])   . Hyponatremia with decreased serum osmolality   .  Hyponatremia   . Quadriplegia and quadriparesis ([place]) 01/14/2017  . GBS (Guillain Barre syndrome) ([place]) 01/13/2017  . Weakness 01/12/2017  . Hypokalemia 01/12/2017  . Endometriosis 12/05/2013  . Chronic low back pain 08/19/2012  . HYPERLIPIDEMIA 12/10/2010  . COLONIC POLYPS, HX OF 12/13/2009  . HYPERGLYCEMIA, FASTING 11/22/2008  . Depression 02/07/2008  . ANOSMIA 02/07/2008  . HEADACHE, CHRONIC 02/07/2008  . MITRAL VALVE PROLAPSE 10/05/2007  . OSTEOPENIA 10/05/2007   Bjorn Loser, PTA  03/26/17, 3:55 PM [facility] [address] [place], [place], [address] Phone: [phone]   Fax:  [phone]  Name: Joanne Morrison MRN: [id number] Date of Birth: 1951-03-21

## 2017-03-27 ENCOUNTER — Ambulatory Visit: Payer: BLUE CROSS/BLUE SHIELD | Admitting: Physical Therapy

## 2017-03-27 ENCOUNTER — Encounter: Payer: BLUE CROSS/BLUE SHIELD | Admitting: Occupational Therapy

## 2017-03-30 ENCOUNTER — Encounter: Payer: BLUE CROSS/BLUE SHIELD | Admitting: Speech Pathology

## 2017-03-30 ENCOUNTER — Ambulatory Visit: Payer: BLUE CROSS/BLUE SHIELD | Admitting: Physical Therapy

## 2017-03-30 ENCOUNTER — Encounter: Payer: BLUE CROSS/BLUE SHIELD | Admitting: Occupational Therapy

## 2017-03-31 ENCOUNTER — Ambulatory Visit: Payer: BLUE CROSS/BLUE SHIELD | Admitting: Physical Therapy

## 2017-03-31 ENCOUNTER — Encounter: Payer: BLUE CROSS/BLUE SHIELD | Admitting: Registered Nurse

## 2017-03-31 ENCOUNTER — Encounter: Payer: BLUE CROSS/BLUE SHIELD | Admitting: Speech Pathology

## 2017-03-31 ENCOUNTER — Encounter: Payer: BLUE CROSS/BLUE SHIELD | Admitting: Occupational Therapy

## 2017-04-01 ENCOUNTER — Ambulatory Visit: Payer: BLUE CROSS/BLUE SHIELD | Admitting: Physical Therapy

## 2017-04-01 ENCOUNTER — Encounter: Payer: Self-pay | Admitting: Occupational Therapy

## 2017-04-01 ENCOUNTER — Ambulatory Visit: Payer: BLUE CROSS/BLUE SHIELD | Admitting: Occupational Therapy

## 2017-04-01 DIAGNOSIS — R2689 Other abnormalities of gait and mobility: Secondary | ICD-10-CM

## 2017-04-01 DIAGNOSIS — R2681 Unsteadiness on feet: Secondary | ICD-10-CM

## 2017-04-01 DIAGNOSIS — M6281 Muscle weakness (generalized): Secondary | ICD-10-CM

## 2017-04-01 DIAGNOSIS — R278 Other lack of coordination: Secondary | ICD-10-CM

## 2017-04-01 DIAGNOSIS — M25512 Pain in left shoulder: Secondary | ICD-10-CM

## 2017-04-01 NOTE — Patient Instructions (Signed)
Dorsiflexion: Resisted   Place green band on top of foot - hold down with opposite foot; 10 reps   - 3 sets of 10   1-2 times/day  Gastroc / Heel Cord Stretch - On Step      Stand with heels over edge of stair. Holding rail, lower heels until stretch is felt in calf of legs. Repeat _1-2__ times. Do __2-3_ times per day.  HOLD 30- 60 secs; USE BLOCK OR BOOK 2"-3" high  http://orth.exer.us/9   Copyright  VHI. All rights reserved.

## 2017-04-01 NOTE — Progress Notes (Signed)
   04/01/17 1125  8 Minute Rule OT  NEUROMUSCULAR RE ED 43

## 2017-04-01 NOTE — Therapy (Signed)
[facility] [address] [place], [place], [address] Phone: [phone]   Fax:  [phone]  Occupational Therapy Treatment  Patient Details  Name: Joanne Morrison MRN: [id number] Date of Birth: 1951/06/24 Referring Provider: Dr Naaman Plummer  Encounter Date: 04/01/2017      OT End of Session - 04/01/17 1116    Visit Number 14   Number of Visits 20   Date for OT Re-Evaluation 04/25/17   Authorization Type BCBS 20 VISIT limit - no authorization required   Authorization - Visit Number 14   Authorization - Number of Visits 20   OT Start Time 1017   OT Stop Time 1100   OT Time Calculation (min) 43 min   Activity Tolerance Patient tolerated treatment well   Behavior During Therapy [facility] for tasks assessed/performed      Past Medical History:  Diagnosis Date  . Anxiety   . Arthritis    back  . Chronic cystitis   . Chronic low back pain   . Depression   . Diverticulosis of colon   . Hemorrhoids   . History of colon polyps    09-24-2009  rectal hyperplastic polyp/   and 2016 non-cancerous  . History of endometriosis   . History of panic attacks   . Hyperlipidemia   . Osteopenia   . Pelvic pain   . Sensation of pressure in bladder area   . Wears glasses     Past Surgical History:  Procedure Laterality Date  . BUNIONECTOMY Left 2004 approx  . CARPAL TUNNEL RELEASE Bilateral right 2014;  left 2007  . COLONOSCOPY W/ POLYPECTOMY  09/24/2009  . CYSTO WITH HYDRODISTENSION [name] 10/15/2016   Procedure: CYSTOSCOPY/HYDRODISTENSION AND MARCAINE INSTILLATION;  Surgeon: Rana Snare, MD;  Location: [facility];  Service: Urology;  Laterality: [name];  . CYSTO/  HYDRODISTENTION/  INSTILLATION THERAPY  1997 approx  . HYSTEROSCOPY W/D&C  11/01/2004   POLYPECTOMY  . IR GASTROSTOMY TUBE REMOVAL  03/20/2017  . IR GENERIC HISTORICAL  01/26/2017   IR GASTROSTOMY TUBE MOD SED 01/26/2017 Sandi Mariscal, MD [facility]  .  LAPAROSCOPIC VAGINAL HYSTERECTOMY WITH SALPINGO OOPHORECTOMY Bilateral 04/20/2012  . TUBAL LIGATION  yrs ago    There were no vitals filed for this visit.      Subjective Assessment - 04/01/17 1023    Subjective  I made him breakfast this morning, eggs, bacon, and grits, and I timed it- more than 30 minutes.     Patient is accompained by: Family member   Pertinent History hx of back pain (went to pain management center)   Currently in Pain? No/denies   Pain Score 0-No pain                      OT Treatments/Exercises (OP) - 04/01/17 0001      ADLs   Functional Mobility Patient walked into clinic without her walker today!  Husband brought walker in case she fatigues.     Cooking Patient reported, and husband confirmed that she was able to make full breakfast - cooking on stovetop- for greater than 30 minutes without rest breaks.       Neurological Re-education Exercises   Other Exercises 1 Neuromuscular reeducation to address proximal strengthening bilateral shoulders, and to address interlimb coordination - shoulder and elbow left arm.  Patient has improved shoulder flexion, yet continues to struggle with isolated elbow flexion (with shoulder extension- as returning from forward reach) Light resistance through yellow  theraband- and guided motion to address folding arm in half so hand moves toward face, while proximal arm is close to body.  Immediately following this practice, patient with improved shoulder flex with elbow extension, and shoulder ext with elbow flexion.  Issued HEP with yellow resistance band                OT Education - 04/01/17 1115    Education provided Yes   Education Details Added to HEP- Shoulder - yellow resistance band   Person(s) Educated Patient;Spouse   Methods Explanation;Demonstration;Handout   Comprehension Verbalized understanding;Returned demonstration          OT Short Term Goals - 03/19/17 1457      OT SHORT TERM GOAL  #1   Title Patient will complete a home activity program daily with min prompting or set up --check STGs 03/27/17   Status Achieved     OT SHORT TERM GOAL #2   Title Patient will complete a home exercise program designed to improve range of motion in BUE's with no more than set up assistance   Status Achieved     OT SHORT TERM GOAL #3   Title Patient will utilize bilateral upper extremities to cut a piece of meat during a meal with modified independence   Status Achieved     OT SHORT TERM GOAL #4   Title Patient will don compression hose with increased time and without physical assistance   Status Achieved     OT SHORT TERM GOAL #5   Title Patient will demonstrate sufficient activity tolerance to complete light housekeeping task in standing for greater than 5 minutes.     Status Achieved     OT SHORT TERM GOAL #6   Title Patient will demonstrate effective mid level reach to retrieve lightweight item less than 2 lbs from chest height shelf in standing using BUE's   Status Achieved           OT Long Term Goals - 04/01/17 1024      OT LONG TERM GOAL #2   Title (P)  Patient will prepare simple hot meal for she and her husband with no rest breaks- 30 min of activity   Status (P)  Achieved               Plan - 04/01/17 1116    Clinical Impression Statement Patient met another long term goal, and is showing improvemwent with proximal stability in shoulders, as well as functional mobility.  Patient more involved with IADL at this time.     Rehab Potential Good   OT Frequency 2x / week   OT Duration 4 weeks   OT Treatment/Interventions Self-care/ADL training;Electrical Stimulation;Therapeutic exercise;Neuromuscular education;Energy conservation;Manual Therapy;Functional Mobility Training;DME and/or AE instruction;Visual/perceptual remediation/compensation;Cognitive remediation/compensation;Therapeutic activities;Balance training;Patient/family education   Plan continue with  proximal strengthening BUE's, mid level reach LUE   OT Home Exercise Plan Added to HEP - shoulder strengthening   Consulted and Agree with Plan of Care Patient;Family member/caregiver      Patient will benefit from skilled therapeutic intervention in order to improve the following deficits and impairments:  Decreased coordination, Decreased range of motion, Decreased endurance, Decreased safety awareness, Decreased balance, Decreased cognition, Decreased strength, Impaired perceived functional ability, Impaired vision/preception, Impaired UE functional use, Impaired tone, Pain, Impaired sensation, Improper body mechanics  Visit Diagnosis: Muscle weakness (generalized)  Other lack of coordination  Acute pain of left shoulder  Unsteadiness on feet    Problem List Patient Active Problem  List   Diagnosis Date Noted  . Acute blood loss anemia   . Physical deconditioning   . Tracheostomy status ([place])   . Hypoxemia   . Dysphagia   . Numbness and tingling   . S/P percutaneous endoscopic gastrostomy (PEG) tube placement ([place])   . Bradycardia   . Left corneal abrasion   . Abnormal chest x-ray   . Acute respiratory failure with hypoxia ([place])   . Guillain Barr syndrome ([place])   . Atrial fibrillation with RVR ([place])   . Dysphagia, pharyngoesophageal phase   . History of ETT   . Encounter for central line placement   . Encounter for feeding tube placement   . Weakness of right lower extremity   . SOB (shortness of breath)   . Pneumonia   . Pressure injury of skin 01/16/2017  . Acute respiratory failure ([place])   . Hyponatremia with decreased serum osmolality   . Hyponatremia   . Quadriplegia and quadriparesis ([place]) 01/14/2017  . GBS (Guillain Barre syndrome) ([place]) 01/13/2017  . Weakness 01/12/2017  . Hypokalemia 01/12/2017  . Endometriosis 12/05/2013  . Chronic low back pain 08/19/2012  . HYPERLIPIDEMIA 12/10/2010  . COLONIC POLYPS, HX OF 12/13/2009  . HYPERGLYCEMIA, FASTING  11/22/2008  . Depression 02/07/2008  . ANOSMIA 02/07/2008  . HEADACHE, CHRONIC 02/07/2008  . MITRAL VALVE PROLAPSE 10/05/2007  . OSTEOPENIA 10/05/2007    Mariah Milling, OTR/L 04/01/2017, 11:30 AM  [facility] [address] [place], [place], [address] Phone: [phone]   Fax:  [phone]  Name: Chelsee Hosie MRN: [id number] Date of Birth: Dec 22, 1950

## 2017-04-01 NOTE — Patient Instructions (Signed)
Resistance band training - shoulders  Lying on your back  With band wrapped over both wrists.  Reach arms up toward the ceiling at shoulder height  with elbows straight. Palms facing each other move hands away from center.   Move evenly, slowly, hold end range for a count of 3.  Repeat with arms at forehead height.    Repeat with arms at chest height.    5 repetitions, 3 sets.

## 2017-04-02 ENCOUNTER — Encounter: Payer: BLUE CROSS/BLUE SHIELD | Admitting: Occupational Therapy

## 2017-04-02 ENCOUNTER — Ambulatory Visit: Payer: BLUE CROSS/BLUE SHIELD | Admitting: Physical Therapy

## 2017-04-02 NOTE — Therapy (Signed)
[facility] [address] [place], [place], [address] Phone: [phone]   Fax:  [phone]  Physical Therapy Treatment  Patient Details  Name: Joanne Morrison MRN: [id number] Date of Birth: [birth year] Referring Provider: Dr. Alger Simons  Encounter Date: 04/01/2017      PT End of Session - 04/02/17 2021    Visit Number 10   Number of Visits 16   Date for PT Re-Evaluation 04/25/17   Authorization Type BC[employer]   Authorization - Visit Number 8   Authorization - Number of Visits 20   PT Start Time 8003   PT Stop Time 1146   PT Time Calculation (min) 44 min      Past Medical History:  Diagnosis Date  . Anxiety   . Arthritis    back  . Chronic cystitis   . Chronic low back pain   . Depression   . Diverticulosis of colon   . Hemorrhoids   . History of colon polyps    09-24-2009  rectal hyperplastic polyp/   and 2016 non-cancerous  . History of endometriosis   . History of panic attacks   . Hyperlipidemia   . Osteopenia   . Pelvic pain   . Sensation of pressure in bladder area   . Wears glasses     Past Surgical History:  Procedure Laterality Date  . BUNIONECTOMY Left 2004 approx  . CARPAL TUNNEL RELEASE Bilateral right 2014;  left 2007  . COLONOSCOPY W/ POLYPECTOMY  09/24/2009  . CYSTO WITH HYDRODISTENSION [name] 10/15/2016   Procedure: CYSTOSCOPY/HYDRODISTENSION AND MARCAINE INSTILLATION;  Surgeon: Rana Snare, MD;  Location: [facility];  Service: Urology;  Laterality: [name];  . CYSTO/  HYDRODISTENTION/  INSTILLATION THERAPY  1997 approx  . HYSTEROSCOPY W/D&C  11/01/2004   POLYPECTOMY  . IR GASTROSTOMY TUBE REMOVAL  03/20/2017  . IR GENERIC HISTORICAL  01/26/2017   IR GASTROSTOMY TUBE MOD SED 01/26/2017 Sandi Mariscal, MD [facility]  . LAPAROSCOPIC VAGINAL HYSTERECTOMY WITH SALPINGO OOPHORECTOMY Bilateral 04/20/2012  . TUBAL LIGATION  yrs ago    There were no vitals filed for this visit.       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 No/denies                         [facility] Adult PT Treatment/Exercise - 04/02/17 0001      Ambulation/Gait   Ambulation/Gait Yes   Ambulation/Gait Assistance 5: Supervision   Ambulation/Gait Assistance Details cues to look up    Ambulation Distance (Feet) 350 Feet   Assistive device None   Gait Pattern Step-through pattern;Decreased arm swing - right;Decreased arm swing - left;Wide base of support   Ambulation Surface Level;Indoor   Stairs Yes   Stairs Assistance 5: Supervision;4: Min guard   Stair Management Technique One rail Right;No rails;Step to pattern;Alternating pattern     Lumbar Exercises: Quadruped   Opposite Arm/Leg Raise Right arm/Left leg;Left arm/Right leg;3 seconds  3 reps each side     Knee/Hip  Exercises: Stretches   Active Hamstring Stretch Both;1 rep;30 seconds  runner's stretch   Gastroc Stretch Both;1 rep;30 seconds     Knee/Hip Exercises: Standing   Forward Step Up Both;1 set;10 reps   Step Down Both;1 set;10 reps;Step Height: 6"   Functional Squat 1 set;10 reps  standing on blue balance beam     Tall kneeling - sitting back on heels and pushing up into tall kneeling x 10 reps  TherAct: pt jogged approx. 100' on flat, even surface with SBA  NeuroRe-ed; sidestepping without UE support with partial squats 10' x 2 reps; crossovers front and then stepping behind 10' x 2 reps; pt performed amb. On toes and then on heels 10' x 2  reps each inside // bars  TherEx:  Instructed in Rt and Lt dorsiflexion strengthening with green theraband - 10 reps each LE:  Added this ex. To HEP  Pt performed standing heel cord stretch with 2" block 30 sec hold each leg - added this ex. to HEP       PT Education - 04/02/17 2027    Education provided Yes   Education Details heel cord stretch in standing;  dorsiflexion with green theraband in seated position   Person(s) Educated Patient;Spouse   Methods Explanation;Demonstration;Handout   Comprehension Verbalized understanding;Returned demonstration          PT Short Term Goals - 02/24/17 1519      PT SHORT TERM GOAL #1   Title Pt will ambulate household distances modified independently without use of RW.  03-27-17   Time 4   Period Weeks   Status New     PT SHORT TERM GOAL #2   Title Amb. 500' with RW with SBA for increased community accessibility.  03-27-17   Time 4   Period Weeks   Status New     PT SHORT TERM GOAL #3   Title Improve TUG score to </= 13.5 secs for decreased fall risk.  03-27-17   Baseline 15.22 secs without RW   Time 4   Period Weeks   Status New     PT SHORT TERM GOAL #4   Title Increase gait velocity to >/= 2.6 ft/sec without device for incr. gait efficiency.  03-27-17   Time 4   Period Weeks   Status New     PT SHORT TERM GOAL #5   Title Increase Berg score by at least 5 points to reduce fall risk.  03-27-17   Baseline TBA next session   Time 4   Period Weeks   Status New     Additional Short Term Goals   Additional Short Term Goals Yes     PT SHORT TERM GOAL #6   Title Independent in HEP for lower extremity strengthening and balance exercises.  03-27-17   Time 4   Period Weeks   Status New           PT Long Term Goals - 02/24/17 1524      PT LONG TERM GOAL #1   Title Modified independent ambulation 1000' on even surfaces without device.  04-26-17   Time 8   Period Weeks   Status New     PT LONG TERM GOAL #2   Title  Increase Berg score by at least 10 points for improved balance and for reduced fall risk.  04-26-17   Time 8   Period Weeks   Status New     PT LONG TERM GOAL #3   Title  Improve TUG score to </= 11.5 secs without device to demo improved functional mobility.  04-26-17   Baseline 17.38 secs with RW:  15.22 secs without RW   Time 8   Period Weeks   Status New     PT LONG TERM GOAL #4   Title Negotiate steps using step over step sequence without rail modified independently.  04-26-17   Baseline SBA with use of 2 rails step by step   Time 8   Period Weeks   Status New     PT LONG TERM GOAL #5   Title Independent in updated HEP for strengthening and balance.  04-26-17   Time 8   Period Weeks   Status New               Plan - 04/02/17 2023    Clinical Impression Statement Pt improving with balance and lower extremity strength, but continues to have Lt quad eccentric weakness as evidenced by need for UE support with step down exercise.  Pt continues to have dorsiflexor weakness bilaterally.   Pt had no LOB with jogging approx. 100'   Rehab Potential Good   PT Frequency 2x / week   PT Duration 8 weeks   PT Treatment/Interventions ADLs/Self Care Home Management;Gait training;Stair training;Functional mobility training;Therapeutic activities;Therapeutic exercise;Balance training;Neuromuscular re-education;Patient/family education   PT Next Visit Plan  PLEASE CHECK STG's!   cont LE strengthening; try leg press; high level balance training as time allows   PT Home Exercise Plan see above - plan to add theraband for LE strengthening;  add hip extension in standing to HEP - added hip ext with red theraband on 03-10-17; added DF strenthening with green band on 04-02-17   Consulted and Agree with Plan of Care Patient;Family member/caregiver   Family Member Consulted spouse Cheral Bay      Patient will benefit from skilled therapeutic intervention in order to improve the following deficits and  impairments:  Abnormal gait, Decreased endurance, Decreased activity tolerance, Decreased balance, Decreased mobility, Decreased strength, Impaired vision/preception, Pain, Impaired sensation  Visit Diagnosis: Muscle weakness (generalized)  Other abnormalities of gait and mobility     Problem List Patient Active Problem List   Diagnosis Date Noted  . Acute blood loss anemia   . Physical deconditioning   . Tracheostomy status ([place])   . Hypoxemia   . Dysphagia   . Numbness and tingling   . S/P percutaneous endoscopic gastrostomy (PEG) tube placement ([place])   . Bradycardia   . Left corneal abrasion   . Abnormal chest x-ray   . Acute respiratory failure with hypoxia ([place])   . Guillain Barr syndrome ([place])   . Atrial fibrillation with RVR ([place])   . Dysphagia, pharyngoesophageal phase   . History of ETT   . Encounter for central line placement   . Encounter for feeding tube placement   . Weakness of right lower extremity   . SOB (shortness of breath)   . Pneumonia   . Pressure injury of skin 01/16/2017  . Acute respiratory failure ([place])   . Hyponatremia with decreased serum osmolality   . Hyponatremia   . Quadriplegia and quadriparesis ([place]) 01/14/2017  . GBS (Guillain Barre syndrome) ([place]) 01/13/2017  . Weakness 01/12/2017  . Hypokalemia 01/12/2017  . Endometriosis 12/05/2013  . Chronic low back pain 08/19/2012  . HYPERLIPIDEMIA 12/10/2010  . COLONIC POLYPS, HX OF 12/13/2009  . HYPERGLYCEMIA, FASTING 11/22/2008  . Depression 02/07/2008  . ANOSMIA 02/07/2008  . HEADACHE, CHRONIC 02/07/2008  .  MITRAL VALVE PROLAPSE 10/05/2007  . OSTEOPENIA 10/05/2007    Alda Lea, PT 04/02/2017, 8:42 PM  [facility] [address] [place], [place], [address] Phone: [phone]   Fax:  [phone]  Name: Joanne Morrison MRN: [id number] Date of Birth: November 27, 1951

## 2017-04-03 ENCOUNTER — Encounter: Payer: Self-pay | Admitting: Occupational Therapy

## 2017-04-03 ENCOUNTER — Encounter: Payer: Self-pay | Admitting: Physical Therapy

## 2017-04-03 ENCOUNTER — Ambulatory Visit: Payer: BLUE CROSS/BLUE SHIELD | Admitting: Occupational Therapy

## 2017-04-03 ENCOUNTER — Ambulatory Visit: Payer: BLUE CROSS/BLUE SHIELD | Admitting: Physical Therapy

## 2017-04-03 DIAGNOSIS — M6281 Muscle weakness (generalized): Secondary | ICD-10-CM | POA: Diagnosis not present

## 2017-04-03 DIAGNOSIS — R2689 Other abnormalities of gait and mobility: Secondary | ICD-10-CM

## 2017-04-03 DIAGNOSIS — R2681 Unsteadiness on feet: Secondary | ICD-10-CM

## 2017-04-03 DIAGNOSIS — R208 Other disturbances of skin sensation: Secondary | ICD-10-CM

## 2017-04-03 DIAGNOSIS — R278 Other lack of coordination: Secondary | ICD-10-CM

## 2017-04-03 DIAGNOSIS — M25512 Pain in left shoulder: Secondary | ICD-10-CM

## 2017-04-03 NOTE — Therapy (Signed)
[facility] [address] [place], [place], [address] Phone: [phone]   Fax:  [phone]  Physical Therapy Treatment  Patient Details  Name: Joanne Morrison MRN: [id number] Date of Birth: [birth year] Referring Provider: Dr. Alger Simons  Encounter Date: 04/03/2017      PT End of Session - 04/03/17 0854    Visit Number 11   Number of Visits 16   Date for PT Re-Evaluation 04/25/17   Authorization Type BCBS   Authorization - Visit Number 11   Authorization - Number of Visits 20   PT Start Time 9485   PT Stop Time 0845   PT Time Calculation (min) 47 min   Activity Tolerance Patient tolerated treatment well   Behavior During Therapy [facility] for tasks assessed/performed      Past Medical History:  Diagnosis Date  . Anxiety   . Arthritis    back  . Chronic cystitis   . Chronic low back pain   . Depression   . Diverticulosis of colon   . Hemorrhoids   . History of colon polyps    09-24-2009  rectal hyperplastic polyp/   and 2016 non-cancerous  . History of endometriosis   . History of panic attacks   . Hyperlipidemia   . Osteopenia   . Pelvic pain   . Sensation of pressure in bladder area   . Wears glasses     Past Surgical History:  Procedure Laterality Date  . BUNIONECTOMY Left 2004 approx  . CARPAL TUNNEL RELEASE Bilateral right 2014;  left 2007  . COLONOSCOPY W/ POLYPECTOMY  09/24/2009  . CYSTO WITH HYDRODISTENSION [name] 10/15/2016   Procedure: CYSTOSCOPY/HYDRODISTENSION AND MARCAINE INSTILLATION;  Surgeon: Rana Snare, MD;  Location: [facility];  Service: Urology;  Laterality: [name];  . CYSTO/  HYDRODISTENTION/  INSTILLATION THERAPY  1997 approx  . HYSTEROSCOPY W/D&C  11/01/2004   POLYPECTOMY  . IR GASTROSTOMY TUBE REMOVAL  03/20/2017  . IR GENERIC HISTORICAL  01/26/2017   IR GASTROSTOMY TUBE MOD SED 01/26/2017 Sandi Mariscal, MD [facility]  . LAPAROSCOPIC VAGINAL HYSTERECTOMY WITH  SALPINGO OOPHORECTOMY Bilateral 04/20/2012  . TUBAL LIGATION  yrs ago    There were no vitals filed for this visit.      Subjective Assessment - 04/03/17 0756    Subjective Legs are a little sore. Was able to squat all the way down to look into bottom cabinets and use hands on floor to push to stand up.    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 No/denies                         [facility] Adult PT Treatment/Exercise - 04/03/17 0803      Ambulation/Gait   Gait velocity 3.22 ft/sec (normal); 4.65 ft/sec (fast)     Berg Balance Test   Sit to Stand Able to stand without using hands and stabilize independently   Standing Unsupported Able to stand safely 2 minutes   Sitting with Back Unsupported but Feet Supported on Floor or Stool Able to sit safely and securely 2 minutes   Stand to Sit Sits safely with minimal use of hands   Transfers Able to transfer safely,  minor use of hands   Standing Unsupported with Eyes Closed Able to stand 10 seconds safely   Standing Ubsupported with Feet Together Able to place feet together independently and stand 1 minute safely   From Standing, Reach Forward with Outstretched Arm Can reach confidently >25 cm (10")   From Standing Position, Pick up Object from Floor Able to pick up shoe safely and easily   From Standing Position, Turn to Look Behind Over each Shoulder Looks behind from both sides and weight shifts well   Turn 360 Degrees Able to turn 360 degrees safely in 4 seconds or less   Standing Unsupported, Alternately Place Feet on Step/Stool Able to stand independently and safely and complete 8 steps in 20 seconds   Standing  Unsupported, One Foot in Front Able to plae foot ahead of the other independently and hold 30 seconds   Standing on One Leg Able to lift leg independently and hold > 10 seconds  RLE >10 sec; LLE 3.49 sec   Total Score 55     Timed Up and Go Test   Normal TUG (seconds) 10.97  normal; 8.00 sec (fastest)     Lumbar Exercises: Machines for Strengthening   Leg Press bilateral 70# x 15 x 3; unilateral 50# x 15, 70# x 10             Balance Exercises - 04/03/17 0853      Balance Exercises: Standing   Other Standing Exercises BAPS board PF and DF to touch edge to floor x 10           PT Education - 04/03/17 0853    Education provided Yes   Education Details results of assessment of STGs   Person(s) Educated Patient;Spouse   Methods Explanation   Comprehension Verbalized understanding          PT Short Term Goals - 04/03/17 0854      PT SHORT TERM GOAL #1   Title Pt will ambulate household distances modified independently without use of RW.  03-27-17   04/03/17 met   Time 4   Period Weeks   Status Achieved     PT SHORT TERM GOAL #2   Title Amb. 500' with RW with SBA for increased community accessibility.  03-27-17   04/03/17 met   Time 4   Period Weeks   Status Achieved     PT SHORT TERM GOAL #3   Title Improve TUG score to </= 13.5 secs for decreased fall risk.  03-27-17  04/03/17  10.97 sec no device   Baseline 15.22 secs without RW   Time 4   Period Weeks   Status Achieved     PT SHORT TERM GOAL #4   Title Increase gait velocity to >/= 2.6 ft/sec without device for incr. gait efficiency.  03-27-17  04/03/17  3.22 ft/sec no device   Time 4   Period Weeks   Status Achieved     PT SHORT TERM GOAL #5   Title Increase Berg score by at least 5 points to reduce fall risk.  03-27-17  04/03/17  55/56   Baseline 46    Time 4   Period Weeks   Status Achieved     PT SHORT TERM GOAL #6   Title Independent in HEP for lower extremity strengthening and balance exercises.   03-27-17   Time 4   Period Weeks   Status Achieved           PT Long  Term Goals - 04/03/17 0901      PT LONG TERM GOAL #1   Title Modified independent ambulation 1000' on even surfaces without device.  04-26-17   Time 8   Period Weeks   Status New     PT LONG TERM GOAL #2   Title Increase Berg score by at least 10 points for improved balance and for reduced fall risk.  04-26-17  04/03/17 met 55/56   Time 8   Period Weeks   Status Achieved     PT LONG TERM GOAL #3   Title Improve TUG score to </= 11.5 secs without device to demo improved functional mobility.  04-26-17  04/03/17 met 10.97   Baseline 17.38 secs with RW:  15.22 secs without RW   Time 8   Period Weeks   Status Achieved     PT LONG TERM GOAL #4   Title Negotiate steps using step over step sequence without rail modified independently.  04-26-17   Baseline SBA with use of 2 rails step by step   Time 8   Period Weeks   Status New     PT LONG TERM GOAL #5   Title Independent in updated HEP for strengthening and balance.  04-26-17   Time 8   Period Weeks   Status New               Plan - 04/03/17 3710    Clinical Impression Statement Patient continues to make great progress with 6 of 6 STGs met (and looking ahead she has met 2 of 5 LTGs--& if tested has likely met 4/5). Remainder of session focused on LE strengthening and balance. Patient is very pleased with her progress and feels her balance is the thing that is slower to return to normal. Will plan to discuss discharge after 1-2 more visits at next visit.    Rehab Potential Good   PT Frequency 2x / week   PT Duration 8 weeks   PT Treatment/Interventions ADLs/Self Care Home Management;Gait training;Stair training;Functional mobility training;Therapeutic activities;Therapeutic exercise;Balance training;Neuromuscular re-education;Patient/family education   PT Next Visit Plan  check remaining LTGs with thought towards d/c at 4/27 visit; review HEP for any  updates or additions needed with eye towards d/c 4/27 (can also be done at d/c visit); cont LE strengthening; high level balance training    PT Home Exercise Plan see above - plan to add theraband for LE strengthening;  add hip extension in standing to HEP - added hip ext with red theraband on 03-10-17; added DF strenthening with green band on 04-02-17   Consulted and Agree with Plan of Care Patient;Family member/caregiver   Family Member Consulted spouse Cheral Bay      Patient will benefit from skilled therapeutic intervention in order to improve the following deficits and impairments:  Abnormal gait, Decreased endurance, Decreased activity tolerance, Decreased balance, Decreased mobility, Decreased strength, Impaired vision/preception, Pain, Impaired sensation  Visit Diagnosis: Muscle weakness (generalized)  Other abnormalities of gait and mobility  Unsteadiness on feet     Problem List Patient Active Problem List   Diagnosis Date Noted  . Acute blood loss anemia   . Physical deconditioning   . Tracheostomy status ([place])   . Hypoxemia   . Dysphagia   . Numbness and tingling   . S/P percutaneous endoscopic gastrostomy (PEG) tube placement ([place])   . Bradycardia   . Left corneal abrasion   . Abnormal chest x-ray   . Acute respiratory failure with hypoxia ([place])   .  Guillain Barr syndrome ([place])   . Atrial fibrillation with RVR ([place])   . Dysphagia, pharyngoesophageal phase   . History of ETT   . Encounter for central line placement   . Encounter for feeding tube placement   . Weakness of right lower extremity   . SOB (shortness of breath)   . Pneumonia   . Pressure injury of skin 01/16/2017  . Acute respiratory failure ([place])   . Hyponatremia with decreased serum osmolality   . Hyponatremia   . Quadriplegia and quadriparesis ([place]) 01/14/2017  . GBS (Guillain Barre syndrome) ([place]) 01/13/2017  . Weakness 01/12/2017  . Hypokalemia 01/12/2017  . Endometriosis 12/05/2013  . Chronic  low back pain 08/19/2012  . HYPERLIPIDEMIA 12/10/2010  . COLONIC POLYPS, HX OF 12/13/2009  . HYPERGLYCEMIA, FASTING 11/22/2008  . Depression 02/07/2008  . ANOSMIA 02/07/2008  . HEADACHE, CHRONIC 02/07/2008  . MITRAL VALVE PROLAPSE 10/05/2007  . OSTEOPENIA 10/05/2007    Rexanne Mano, PT 04/03/2017, 9:12 AM  [facility] [address] [place], [place], [address] Phone: [phone]   Fax:  [phone]  Name: Joanne Morrison MRN: [id number] Date of Birth: November 06, 1951

## 2017-04-03 NOTE — Therapy (Signed)
[facility] [address] [place], [place], [address] Phone: [phone]   Fax:  [phone]  Occupational Therapy Treatment  Patient Details  Name: Joanne Morrison MRN: [id number] Date of Birth: January 16, 1951 Referring Provider: Dr Naaman Plummer  Encounter Date: 04/03/2017      OT End of Session - 04/03/17 0945    Visit Number 15   Number of Visits 20   Date for OT Re-Evaluation 04/25/17   Authorization Type [employer] 20 VISIT limit - no authorization required   Authorization - Visit Number 15   Authorization - Number of Visits 20   OT Start Time 0845   OT Stop Time 0935   OT Time Calculation (min) 50 min   Activity Tolerance Patient tolerated treatment well   Behavior During Therapy [facility] for tasks assessed/performed      Past Medical History:  Diagnosis Date  . Anxiety   . Arthritis    back  . Chronic cystitis   . Chronic low back pain   . Depression   . Diverticulosis of colon   . Hemorrhoids   . History of colon polyps    09-24-2009  rectal hyperplastic polyp/   and 2016 non-cancerous  . History of endometriosis   . History of panic attacks   . Hyperlipidemia   . Osteopenia   . Pelvic pain   . Sensation of pressure in bladder area   . Wears glasses     Past Surgical History:  Procedure Laterality Date  . BUNIONECTOMY Left 2004 approx  . CARPAL TUNNEL RELEASE Bilateral right 2014;  left 2007  . COLONOSCOPY W/ POLYPECTOMY  09/24/2009  . CYSTO WITH HYDRODISTENSION [name] 10/15/2016   Procedure: CYSTOSCOPY/HYDRODISTENSION AND MARCAINE INSTILLATION;  Surgeon: Rana Snare, MD;  Location: [facility];  Service: Urology;  Laterality: [name];  . CYSTO/  HYDRODISTENTION/  INSTILLATION THERAPY  1997 approx  . HYSTEROSCOPY W/D&C  11/01/2004   POLYPECTOMY  . IR GASTROSTOMY TUBE REMOVAL  03/20/2017  . IR GENERIC HISTORICAL  01/26/2017   IR GASTROSTOMY TUBE MOD SED 01/26/2017 Sandi Mariscal, MD [facility]  .  LAPAROSCOPIC VAGINAL HYSTERECTOMY WITH SALPINGO OOPHORECTOMY Bilateral 04/20/2012  . TUBAL LIGATION  yrs ago    There were no vitals filed for this visit.      Subjective Assessment - 04/03/17 0849    Subjective  I used to not be able to put the towel on the towel rack.  I did that this morning.   Patient is accompained by: Family member   Pertinent History hx of back pain (went to pain management center)   Pain Score 0-No pain                      OT Treatments/Exercises (OP) - 04/03/17 0001      Neurological Re-education Exercises   Other Exercises 1 Neuromuscular reeducation to address scapular stability, and proxinmal strengthening for mid level reach.  Patient with significant proximal weaknesss right, and poor interlimb coordination left.  Developed HEP to address both issues.  Reviewed each exercise in detail with patient and husband.  Husband videotaped exercises to reinforce correct technique at home.               Balance Exercises - 04/03/17 0853      Balance Exercises: Standing   Other Standing Exercises BAPS board PF and DF to touch edge to floor x 10           OT Education -  04/03/17 0945    Education provided Yes   Education Details Continued shoulder HEP   Person(s) Educated Patient;Spouse   Methods Explanation;Demonstration;Handout   Comprehension Verbalized understanding;Returned demonstration;Need further instruction          OT Short Term Goals - 03/19/17 1457      OT SHORT TERM GOAL #1   Title Patient will complete a home activity program daily with min prompting or set up --check STGs 03/27/17   Status Achieved     OT SHORT TERM GOAL #2   Title Patient will complete a home exercise program designed to improve range of motion in BUE's with no more than set up assistance   Status Achieved     OT SHORT TERM GOAL #3   Title Patient will utilize bilateral upper extremities to cut a piece of meat during a meal with modified  independence   Status Achieved     OT SHORT TERM GOAL #4   Title Patient will don compression hose with increased time and without physical assistance   Status Achieved     OT SHORT TERM GOAL #5   Title Patient will demonstrate sufficient activity tolerance to complete light housekeeping task in standing for greater than 5 minutes.     Status Achieved     OT SHORT TERM GOAL #6   Title Patient will demonstrate effective mid level reach to retrieve lightweight item less than 2 lbs from chest height shelf in standing using BUE's   Status Achieved           OT Long Term Goals - 04/03/17 0947      OT LONG TERM GOAL #1   Title Patient will complete an updated home exercise program designed for UE strengthening daily--check LTGs 04/26/17   Status On-going     OT LONG TERM GOAL #2   Title Patient will prepare simple hot meal for she and her husband with no rest breaks- 30 min of activity   Status Achieved     OT LONG TERM GOAL #3   Title Patient will complete laundry task- including transporting items to washer, and transferring to dryer, folding and putting away clothing with intermittent assistance only   Status Achieved     OT LONG TERM GOAL #4   Title Patient will utilize bilateral upper extremities to blow dry and style her hair with modified independence   Status On-going     OT LONG TERM GOAL #5   Title Patient will demonstrate sufficient UE/LE strength and balance to reach to floor to retrieve a lightweight (no more than 2 lb) object without loss of balance   Status On-going     OT LONG TERM GOAL #6   Title Patient will demonstrate sufficient strength for unilateral reach to retrieve a 3 lb object from overhead shelf while standing with right hand and left hand (not bilateral reach)   Status On-going     OT LONG TERM GOAL #7   Title Patient will demonstrate sufficient strength to get down to floor to clean tub, and get back up without assistance   Status On-going                Plan - 04/03/17 0946    Clinical Impression Statement Discussed remaining OT visits.  Will consider decreasing to 1x/week to extend time of rehab program.  Patient making excellent progress, and working dilligently at home as well.     Rehab Potential Good   OT Frequency 2x /  week   OT Duration 4 weeks   OT Treatment/Interventions Self-care/ADL training;Electrical Stimulation;Therapeutic exercise;Neuromuscular education;Energy conservation;Manual Therapy;Functional Mobility Training;DME and/or AE instruction;Visual/perceptual remediation/compensation;Cognitive remediation/compensation;Therapeutic activities;Balance training;Patient/family education   Plan proximal strengthening - scapular stability, biomechanicw of low or supported mid reach   OT Home Exercise Plan Added to HEP - shoulder strengthening   Consulted and Agree with Plan of Care Patient;Family member/caregiver      Patient will benefit from skilled therapeutic intervention in order to improve the following deficits and impairments:  Decreased coordination, Decreased range of motion, Decreased endurance, Decreased safety awareness, Decreased balance, Decreased cognition, Decreased strength, Impaired perceived functional ability, Impaired vision/preception, Impaired UE functional use, Impaired tone, Pain, Impaired sensation, Improper body mechanics  Visit Diagnosis: Muscle weakness (generalized)  Unsteadiness on feet  Other lack of coordination  Acute pain of left shoulder  Other disturbances of skin sensation    Problem List Patient Active Problem List   Diagnosis Date Noted  . Acute blood loss anemia   . Physical deconditioning   . Tracheostomy status ([place])   . Hypoxemia   . Dysphagia   . Numbness and tingling   . S/P percutaneous endoscopic gastrostomy (PEG) tube placement ([place])   . Bradycardia   . Left corneal abrasion   . Abnormal chest x-ray   . Acute respiratory failure with hypoxia  ([place])   . Guillain Barr syndrome ([place])   . Atrial fibrillation with RVR ([place])   . Dysphagia, pharyngoesophageal phase   . History of ETT   . Encounter for central line placement   . Encounter for feeding tube placement   . Weakness of right lower extremity   . SOB (shortness of breath)   . Pneumonia   . Pressure injury of skin 01/16/2017  . Acute respiratory failure ([place])   . Hyponatremia with decreased serum osmolality   . Hyponatremia   . Quadriplegia and quadriparesis ([place]) 01/14/2017  . GBS (Guillain Barre syndrome) ([place]) 01/13/2017  . Weakness 01/12/2017  . Hypokalemia 01/12/2017  . Endometriosis 12/05/2013  . Chronic low back pain 08/19/2012  . HYPERLIPIDEMIA 12/10/2010  . COLONIC POLYPS, HX OF 12/13/2009  . HYPERGLYCEMIA, FASTING 11/22/2008  . Depression 02/07/2008  . ANOSMIA 02/07/2008  . HEADACHE, CHRONIC 02/07/2008  . MITRAL VALVE PROLAPSE 10/05/2007  . OSTEOPENIA 10/05/2007    Marlowe Sax M,OTR/L 04/03/2017, 9:49 AM  [facility] [address] [place], [place], [address] Phone: [phone]   Fax:  [phone]  Name: Joanne Morrison MRN: [id number] Date of Birth: [birth year]

## 2017-04-03 NOTE — Patient Instructions (Addendum)
Chair push ups: Sitting in a chair with arms, press up with both arms so bottom lifts off the surface.   Hold the lift for 5 seconds. Repeat 10 times, 3 sets..3 times/day.    Seated on bed or couch. Use wooden dowel vertical to the floor.  1) Pole reaching Grab dowel with left hand at chest height. Push dowel forward and pul back- keep arm close to body throughout motion.  Keep shoulder pressed down.   Repeat 10 times, 3 sets..3 times/day.  2) Pole tiliting Place tip of pole to left of left toes. Tilt pole left and right- following arc of motion. Do not use your body to help change directions.    3)  Pole sliding Place tip of pole in between two feet, towards heel or behind heels Wrap washcloth around pole and slide hand up chest to chin Slide back down Press shoulder blade down throughout motion. Repeat 10 times, 3 sets..3 times/day.  Pain indicates a problem- check posture. If discomfort- attempt slide forward lean to reduce gravity.    Right shoulder rotation. Seated up tall- using yellow resistance band. Hold band stable with left hand at belly button.   Wrap band around right wrist. Tuck right elbow against waist, and slowly rotate arm so hand moves away from body. Hold stretch x 3 seconds, return slowly. 10 repetitions, 3 sets.  3 times/day

## 2017-04-06 ENCOUNTER — Ambulatory Visit: Payer: BLUE CROSS/BLUE SHIELD | Admitting: Occupational Therapy

## 2017-04-06 ENCOUNTER — Ambulatory Visit: Payer: BLUE CROSS/BLUE SHIELD | Admitting: Physical Therapy

## 2017-04-07 ENCOUNTER — Ambulatory Visit: Payer: BLUE CROSS/BLUE SHIELD | Admitting: Physical Therapy

## 2017-04-07 ENCOUNTER — Encounter: Payer: BLUE CROSS/BLUE SHIELD | Admitting: Occupational Therapy

## 2017-04-07 ENCOUNTER — Encounter: Payer: BLUE CROSS/BLUE SHIELD | Admitting: Speech Pathology

## 2017-04-09 ENCOUNTER — Ambulatory Visit: Payer: BLUE CROSS/BLUE SHIELD | Admitting: Physical Therapy

## 2017-04-09 ENCOUNTER — Encounter: Payer: BLUE CROSS/BLUE SHIELD | Admitting: Occupational Therapy

## 2017-04-09 ENCOUNTER — Encounter: Payer: BLUE CROSS/BLUE SHIELD | Admitting: Speech Pathology

## 2017-04-10 ENCOUNTER — Encounter: Payer: Self-pay | Admitting: Physical Therapy

## 2017-04-10 ENCOUNTER — Ambulatory Visit: Payer: BLUE CROSS/BLUE SHIELD | Admitting: Physical Therapy

## 2017-04-10 ENCOUNTER — Ambulatory Visit: Payer: BLUE CROSS/BLUE SHIELD | Admitting: Occupational Therapy

## 2017-04-10 DIAGNOSIS — M6281 Muscle weakness (generalized): Secondary | ICD-10-CM | POA: Diagnosis not present

## 2017-04-10 DIAGNOSIS — R278 Other lack of coordination: Secondary | ICD-10-CM

## 2017-04-10 DIAGNOSIS — R2681 Unsteadiness on feet: Secondary | ICD-10-CM

## 2017-04-10 DIAGNOSIS — R208 Other disturbances of skin sensation: Secondary | ICD-10-CM

## 2017-04-10 NOTE — Therapy (Signed)
[facility] [address] [place], [place], [address] Phone: [phone]   Fax:  [phone]  Occupational Therapy Treatment  Patient Details  Name: Joanne Morrison MRN: [id number] Date of Birth: July 29, 1951 Referring Provider: Dr Naaman Plummer  Encounter Date: 04/10/2017      OT End of Session - 04/10/17 0948    Visit Number 16   Number of Visits 20   Date for OT Re-Evaluation 04/25/17   Authorization Type BCBS  no visit limit   Authorization - Visit Number 16   Authorization - Number of Visits 20   OT Start Time 0840   OT Stop Time 0928   OT Time Calculation (min) 48 min   Activity Tolerance Patient tolerated treatment well   Behavior During Therapy [facility] for tasks assessed/performed      Past Medical History:  Diagnosis Date  . Anxiety   . Arthritis    back  . Chronic cystitis   . Chronic low back pain   . Depression   . Diverticulosis of colon   . Hemorrhoids   . History of colon polyps    09-24-2009  rectal hyperplastic polyp/   and 2016 non-cancerous  . History of endometriosis   . History of panic attacks   . Hyperlipidemia   . Osteopenia   . Pelvic pain   . Sensation of pressure in bladder area   . Wears glasses     Past Surgical History:  Procedure Laterality Date  . BUNIONECTOMY Left 2004 approx  . CARPAL TUNNEL RELEASE Bilateral right 2014;  left 2007  . COLONOSCOPY W/ POLYPECTOMY  09/24/2009  . CYSTO WITH HYDRODISTENSION [name] 10/15/2016   Procedure: CYSTOSCOPY/HYDRODISTENSION AND MARCAINE INSTILLATION;  Surgeon: Rana Snare, MD;  Location: [facility];  Service: Urology;  Laterality: [name];  . CYSTO/  HYDRODISTENTION/  INSTILLATION THERAPY  1997 approx  . HYSTEROSCOPY W/D&C  11/01/2004   POLYPECTOMY  . IR GASTROSTOMY TUBE REMOVAL  03/20/2017  . IR GENERIC HISTORICAL  01/26/2017   IR GASTROSTOMY TUBE MOD SED 01/26/2017 Sandi Mariscal, MD [facility]  . LAPAROSCOPIC VAGINAL HYSTERECTOMY  WITH SALPINGO OOPHORECTOMY Bilateral 04/20/2012  . TUBAL LIGATION  yrs ago    There were no vitals filed for this visit.      Subjective Assessment - 04/10/17 0937    Subjective  Pt wants to continue OT   Pertinent History hx of back pain (went to pain management center)   Currently in Pain? No/denies                    Prone on elbows lifting chest x 10 reps, min v.c  Low range AA/ROM with RUE on hemiglide, min-mod facilitation. Standing pt was instructed in supported reach using RUE, while supporting with LUE for low range reaching.          OT Education - 04/10/17 [phone]    Education provided Yes   Education Details Reviewed shoulder HEP for pole exercises and theraband in supine, 10 reps each   Person(s) Educated Patient;Spouse   Methods Explanation;Demonstration;Tactile cues;Verbal cues   Comprehension Verbalized understanding;Returned demonstration;Verbal cues required          OT Short Term Goals - 03/19/17 1457      OT SHORT TERM GOAL #1   Title Patient will complete a home activity program daily with min prompting or set up --check STGs 03/27/17   Status Achieved     OT SHORT TERM GOAL #2   Title  Patient will complete a home exercise program designed to improve range of motion in BUE's with no more than set up assistance   Status Achieved     OT SHORT TERM GOAL #3   Title Patient will utilize bilateral upper extremities to cut a piece of meat during a meal with modified independence   Status Achieved     OT SHORT TERM GOAL #4   Title Patient will don compression hose with increased time and without physical assistance   Status Achieved     OT SHORT TERM GOAL #5   Title Patient will demonstrate sufficient activity tolerance to complete light housekeeping task in standing for greater than 5 minutes.     Status Achieved     OT SHORT TERM GOAL #6   Title Patient will demonstrate effective mid level reach to retrieve lightweight item less than  2 lbs from chest height shelf in standing using BUE's   Status Achieved           OT Long Term Goals - 04/03/17 0947      OT LONG TERM GOAL #1   Title Patient will complete an updated home exercise program designed for UE strengthening daily--check LTGs 04/26/17   Status On-going     OT LONG TERM GOAL #2   Title Patient will prepare simple hot meal for she and her husband with no rest breaks- 30 min of activity   Status Achieved     OT LONG TERM GOAL #3   Title Patient will complete laundry task- including transporting items to washer, and transferring to dryer, folding and putting away clothing with intermittent assistance only   Status Achieved     OT LONG TERM GOAL #4   Title Patient will utilize bilateral upper extremities to blow dry and style her hair with modified independence   Status On-going     OT LONG TERM GOAL #5   Title Patient will demonstrate sufficient UE/LE strength and balance to reach to floor to retrieve a lightweight (no more than 2 lb) object without loss of balance   Status On-going     OT LONG TERM GOAL #6   Title Patient will demonstrate sufficient strength for unilateral reach to retrieve a 3 lb object from overhead shelf while standing with right hand and left hand (not bilateral reach)   Status On-going     OT LONG TERM GOAL #7   Title Patient will demonstrate sufficient strength to get down to floor to clean tub, and get back up without assistance   Status On-going               Plan - 04/10/17 0937    Clinical Impression Statement Pt/ husband agrre with continuing OT 2x week. Pt is progressing towards goals. She continues to be limited by decreased body awareness.   Rehab Potential Good   OT Frequency 2x / week   OT Duration 4 weeks   OT Treatment/Interventions Self-care/ADL training;Electrical Stimulation;Therapeutic exercise;Neuromuscular education;Energy conservation;Manual Therapy;Functional Mobility Training;DME and/or AE  instruction;Visual/perceptual remediation/compensation;Cognitive remediation/compensation;Therapeutic activities;Balance training;Patient/family education   Plan continue proximal strengthening and low range reach   OT Home Exercise Plan Added to HEP - shoulder strengthening   Consulted and Agree with Plan of Care Patient;Family member/caregiver      Patient will benefit from skilled therapeutic intervention in order to improve the following deficits and impairments:  Decreased coordination, Decreased range of motion, Decreased endurance, Decreased safety awareness, Decreased balance, Decreased cognition, Decreased strength, Impaired perceived  functional ability, Impaired vision/preception, Impaired UE functional use, Impaired tone, Pain, Impaired sensation, Improper body mechanics  Visit Diagnosis: Muscle weakness (generalized)  Other lack of coordination  Other disturbances of skin sensation    Problem List Patient Active Problem List   Diagnosis Date Noted  . Acute blood loss anemia   . Physical deconditioning   . Tracheostomy status ([place])   . Hypoxemia   . Dysphagia   . Numbness and tingling   . S/P percutaneous endoscopic gastrostomy (PEG) tube placement ([place])   . Bradycardia   . Left corneal abrasion   . Abnormal chest x-ray   . Acute respiratory failure with hypoxia ([place])   . Guillain Barr syndrome ([place])   . Atrial fibrillation with RVR ([place])   . Dysphagia, pharyngoesophageal phase   . History of ETT   . Encounter for central line placement   . Encounter for feeding tube placement   . Weakness of right lower extremity   . SOB (shortness of breath)   . Pneumonia   . Pressure injury of skin 01/16/2017  . Acute respiratory failure ([place])   . Hyponatremia with decreased serum osmolality   . Hyponatremia   . Quadriplegia and quadriparesis ([place]) 01/14/2017  . GBS (Guillain Barre syndrome) ([place]) 01/13/2017  . Weakness 01/12/2017  . Hypokalemia 01/12/2017  .  Endometriosis 12/05/2013  . Chronic low back pain 08/19/2012  . HYPERLIPIDEMIA 12/10/2010  . COLONIC POLYPS, HX OF 12/13/2009  . HYPERGLYCEMIA, FASTING 11/22/2008  . Depression 02/07/2008  . ANOSMIA 02/07/2008  . HEADACHE, CHRONIC 02/07/2008  . MITRAL VALVE PROLAPSE 10/05/2007  . OSTEOPENIA 10/05/2007    Dannie Hattabaugh 04/10/2017, 9:49 AM  [facility] [address] [place], [place], [address] Phone: [phone]   Fax:  [phone]  Name: Joanne Morrison MRN: [id number] Date of Birth: 06/22/1951

## 2017-04-10 NOTE — Therapy (Signed)
[facility] [address] [place], [place], [address] Phone: [phone]   Fax:  [phone]  Physical Therapy Treatment  Patient Details  Name: Tahjanae Morrison MRN: [id number] Date of Birth: [birth year] Referring Provider: Dr. Alger Simons  Encounter Date: 04/10/2017      PT End of Session - 04/10/17 1702    Visit Number 12   Number of Visits 16   Date for PT Re-Evaluation 04/25/17   Authorization Type BCBS   Authorization - Visit Number 12   Authorization - Number of Visits 20   PT Start Time 0930   PT Stop Time 1015   PT Time Calculation (min) 45 min   Activity Tolerance Patient tolerated treatment well   Behavior During Therapy [facility] for tasks assessed/performed      Past Medical History:  Diagnosis Date  . Anxiety   . Arthritis    back  . Chronic cystitis   . Chronic low back pain   . Depression   . Diverticulosis of colon   . Hemorrhoids   . History of colon polyps    09-24-2009  rectal hyperplastic polyp/   and 2016 non-cancerous  . History of endometriosis   . History of panic attacks   . Hyperlipidemia   . Osteopenia   . Pelvic pain   . Sensation of pressure in bladder area   . Wears glasses     Past Surgical History:  Procedure Laterality Date  . BUNIONECTOMY Left 2004 approx  . CARPAL TUNNEL RELEASE Bilateral right 2014;  left 2007  . COLONOSCOPY W/ POLYPECTOMY  09/24/2009  . CYSTO WITH HYDRODISTENSION [name] 10/15/2016   Procedure: CYSTOSCOPY/HYDRODISTENSION AND MARCAINE INSTILLATION;  Surgeon: Rana Snare, MD;  Location: [facility];  Service: Urology;  Laterality: [name];  . CYSTO/  HYDRODISTENTION/  INSTILLATION THERAPY  1997 approx  . HYSTEROSCOPY W/D&C  11/01/2004   POLYPECTOMY  . IR GASTROSTOMY TUBE REMOVAL  03/20/2017  . IR GENERIC HISTORICAL  01/26/2017   IR GASTROSTOMY TUBE MOD SED 01/26/2017 Sandi Mariscal, MD [facility]-INTERV RAD  . LAPAROSCOPIC VAGINAL HYSTERECTOMY WITH  SALPINGO OOPHORECTOMY Bilateral 04/20/2012  . TUBAL LIGATION  yrs ago    There were no vitals filed for this visit.      Subjective Assessment - 04/10/17 0932    Subjective Able to go to lunch with sister yesterday and noticed standing still is more difficult than moving. Has been increasing her walking in neighborhood and is doing just over 1 mile. Reports she used to go to [facility] to exercise.   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 No/denies            [facility] PT Assessment - 04/10/17 0001      Functional Gait  Assessment   Gait assessed  Yes                     [facility] Adult PT Treatment/Exercise - 04/10/17 0001      Transfers   Sit to Stand 7: Independent   Stand to Sit 7: Independent     Ambulation/Gait   Ambulation/Gait Yes   Ambulation/Gait Assistance 7: Independent   Ambulation Distance (Feet) 1200 Feet   Assistive device  None   Gait Pattern Within Functional Limits   Ambulation Surface Level;Unlevel;Indoor;Outdoor;Paved;Gravel;Grass   Stairs Yes   Stairs Assistance 6: Modified independent (Device/Increase time)   Stair Management Technique No rails;Alternating pattern   Number of Stairs 8   Height of Stairs 6             Balance Exercises - 04/10/17 1715      Balance Exercises: Standing   Standing Eyes Opened Narrow base of support (BOS);Foam/compliant surface;30 secs   Standing Eyes Closed Narrow base of support (BOS);Foam/compliant surface;Solid surface;10 secs;30 secs   Tandem Stance Eyes open;4 reps;30 secs   Balance Beam black, parallel EO EC   Tandem Gait Forward;2 reps   Other Standing Exercises Bosu in //  bars; EO 30 sec no UE support; 10 minisquats with light UE support           PT Education - 04/10/17 1701    Education provided Yes   Education Details reviewed HEP with no questions; reviewed need to continue balance exercises and walking program   Person(s) Educated Patient   Methods Explanation   Comprehension Verbalized understanding          PT Short Term Goals - 04/03/17 0854      PT SHORT TERM GOAL #1   Title Pt will ambulate household distances modified independently without use of RW.  03-27-17   04/03/17 met   Time 4   Period Weeks   Status Achieved     PT SHORT TERM GOAL #2   Title Amb. 500' with RW with SBA for increased community accessibility.  03-27-17   04/03/17 met   Time 4   Period Weeks   Status Achieved     PT SHORT TERM GOAL #3   Title Improve TUG score to </= 13.5 secs for decreased fall risk.  03-27-17  04/03/17  10.97 sec no device   Baseline 15.22 secs without RW   Time 4   Period Weeks   Status Achieved     PT SHORT TERM GOAL #4   Title Increase gait velocity to >/= 2.6 ft/sec without device for incr. gait efficiency.  03-27-17  04/03/17  3.22 ft/sec no device   Time 4   Period Weeks   Status Achieved     PT SHORT TERM GOAL #5   Title Increase Berg score by at least 5 points to reduce fall risk.  03-27-17  04/03/17  55/56   Baseline 46    Time 4   Period Weeks   Status Achieved     PT SHORT TERM GOAL #6   Title Independent in HEP for lower extremity strengthening and balance exercises.  03-27-17   Time 4   Period Weeks   Status Achieved           PT Long Term Goals - 04/10/17 0935      PT LONG TERM GOAL #1   Title Modified independent ambulation 1000' on even surfaces without device.  04-26-17  04/10/17 1200 ft outdoors various surfaces, head turns   Time 8   Period Weeks   Status Achieved     PT LONG TERM GOAL #2   Title Increase Berg score by at least 10 points for improved balance and for reduced fall risk.  04-26-17  04/03/17 met  55/56   Time 8   Period Weeks   Status Achieved     PT LONG TERM GOAL #3   Title Improve TUG score to </= 11.5 secs  without device to demo improved functional mobility.  04-26-17  04/03/17 met 10.97   Baseline 17.38 secs with RW:  15.22 secs without RW   Time 8   Period Weeks   Status Achieved     PT LONG TERM GOAL #4   Title Negotiate steps using step over step sequence without rail modified independently.  04-26-17   Baseline SBA with use of 2 rails step by step   Time 8   Period Weeks   Status Achieved     PT LONG TERM GOAL #5   Title Independent in updated HEP for strengthening and balance.  04-26-17   Time 8   Period Weeks   Status Achieved               Plan - 04/10/17 1712    Clinical Impression Statement Session focused on re-assessment and noted 5 of 5 LTGs achieved. Patient reports she is trying to walk daily in her neighborhood (weather permitting) and has been considering return to [facility] to workout. Patient reports no issues with mobility other than building her endurance. Patient/spouse agree with discharge from PT.    Rehab Potential Good   PT Treatment/Interventions ADLs/Self Care Home Management;Gait training;Stair training;Functional mobility training;Therapeutic activities;Therapeutic exercise;Balance training;Neuromuscular re-education;Patient/family education   PT Home Exercise Plan see above - plan to add theraband for LE strengthening;  add hip extension in standing to HEP - added hip ext with red theraband on 03-10-17; added DF strenthening with green band on 04-02-17   Consulted and Agree with Plan of Care Patient;Family member/caregiver   Family Member Consulted spouse [place]      Patient will benefit from skilled therapeutic intervention in order to improve the following deficits and impairments:  Abnormal gait, Decreased endurance, Decreased activity tolerance, Decreased balance, Decreased mobility, Decreased strength, Impaired  vision/preception, Pain, Impaired sensation  Visit Diagnosis: Muscle weakness (generalized)  Unsteadiness on feet     Problem List Patient Active Problem List   Diagnosis Date Noted  . Acute blood loss anemia   . Physical deconditioning   . Tracheostomy status ([place])   . Hypoxemia   . Dysphagia   . Numbness and tingling   . S/P percutaneous endoscopic gastrostomy (PEG) tube placement ([place])   . Bradycardia   . Left corneal abrasion   . Abnormal chest x-ray   . Acute respiratory failure with hypoxia ([place])   . Guillain Barr syndrome ([place])   . Atrial fibrillation with RVR ([place])   . Dysphagia, pharyngoesophageal phase   . History of ETT   . Encounter for central line placement   . Encounter for feeding tube placement   . Weakness of right lower extremity   . SOB (shortness of breath)   . Pneumonia   . Pressure injury of skin 01/16/2017  . Acute respiratory failure ([place])   . Hyponatremia with decreased serum osmolality   . Hyponatremia   . Quadriplegia and quadriparesis ([place]) 01/14/2017  . GBS (Guillain Barre syndrome) ([place]) 01/13/2017  . Weakness 01/12/2017  . Hypokalemia 01/12/2017  . Endometriosis 12/05/2013  . Chronic low back pain 08/19/2012  . HYPERLIPIDEMIA 12/10/2010  . COLONIC POLYPS, HX OF 12/13/2009  . HYPERGLYCEMIA, FASTING 11/22/2008  . Depression 02/07/2008  . ANOSMIA 02/07/2008  . HEADACHE, CHRONIC 02/07/2008  . MITRAL VALVE PROLAPSE 10/05/2007  . OSTEOPENIA 10/05/2007  PHYSICAL THERAPY DISCHARGE SUMMARY  Visits from Start of Care: 12  Current functional level related to goals / functional outcomes: Gait velocity 3.22 ft/sec, Merrilee Jansky  55/56, TUG 10.97   Remaining deficits: Minimal difficulty with static balance with narrow BOS; incr difficulty same with eyes closed   Education / Equipment: HEP  Plan: Patient agrees to discharge.  Patient goals were met. Patient is being discharged due to meeting the stated rehab goals.  ?????       Rexanne Mano, PT 04/10/2017, 5:18 PM  [facility] [address] [place], [place], [address] Phone: [phone]   Fax:  [phone]  Name: Emmakate Hypes MRN: [id number] Date of Birth: Dec 14, 1951

## 2017-04-13 ENCOUNTER — Encounter: Payer: BLUE CROSS/BLUE SHIELD | Admitting: Speech Pathology

## 2017-04-13 ENCOUNTER — Ambulatory Visit: Payer: BLUE CROSS/BLUE SHIELD | Admitting: Physical Therapy

## 2017-04-13 ENCOUNTER — Encounter: Payer: BLUE CROSS/BLUE SHIELD | Admitting: Occupational Therapy

## 2017-04-14 ENCOUNTER — Ambulatory Visit: Payer: BLUE CROSS/BLUE SHIELD | Attending: Physical Medicine & Rehabilitation | Admitting: Occupational Therapy

## 2017-04-14 ENCOUNTER — Ambulatory Visit: Payer: BLUE CROSS/BLUE SHIELD | Admitting: Physical Therapy

## 2017-04-14 DIAGNOSIS — R2689 Other abnormalities of gait and mobility: Secondary | ICD-10-CM | POA: Diagnosis present

## 2017-04-14 DIAGNOSIS — R278 Other lack of coordination: Secondary | ICD-10-CM | POA: Insufficient documentation

## 2017-04-14 DIAGNOSIS — R2681 Unsteadiness on feet: Secondary | ICD-10-CM

## 2017-04-14 DIAGNOSIS — R208 Other disturbances of skin sensation: Secondary | ICD-10-CM | POA: Diagnosis present

## 2017-04-14 DIAGNOSIS — M6281 Muscle weakness (generalized): Secondary | ICD-10-CM | POA: Insufficient documentation

## 2017-04-14 DIAGNOSIS — M25512 Pain in left shoulder: Secondary | ICD-10-CM | POA: Insufficient documentation

## 2017-04-14 NOTE — Therapy (Signed)
[facility] [address] [place], [place], [address] Phone: [phone]   Fax:  [phone]  Occupational Therapy Treatment  Patient Details  Name: Joanne Morrison MRN: [id number] Date of Birth: 1951/11/18 Referring Provider: Dr Naaman Plummer  Encounter Date: 04/14/2017      OT End of Session - 04/14/17 1300    Visit Number 17   Number of Visits 20   Date for OT Re-Evaluation 04/25/17   Authorization Type BCBS  no visit limit   Authorization - Visit Number 17   Authorization - Number of Visits 20   OT Start Time 1102   OT Stop Time 1145   OT Time Calculation (min) 43 min   Activity Tolerance Patient tolerated treatment well   Behavior During Therapy [facility] for tasks assessed/performed      Past Medical History:  Diagnosis Date  . Anxiety   . Arthritis    back  . Chronic cystitis   . Chronic low back pain   . Depression   . Diverticulosis of colon   . Hemorrhoids   . History of colon polyps    09-24-2009  rectal hyperplastic polyp/   and 2016 non-cancerous  . History of endometriosis   . History of panic attacks   . Hyperlipidemia   . Osteopenia   . Pelvic pain   . Sensation of pressure in bladder area   . Wears glasses     Past Surgical History:  Procedure Laterality Date  . BUNIONECTOMY Left 2004 approx  . CARPAL TUNNEL RELEASE Bilateral right 2014;  left 2007  . COLONOSCOPY W/ POLYPECTOMY  09/24/2009  . CYSTO WITH HYDRODISTENSION [name] 10/15/2016   Procedure: CYSTOSCOPY/HYDRODISTENSION AND MARCAINE INSTILLATION;  Surgeon: Rana Snare, MD;  Location: [facility];  Service: Urology;  Laterality: [name];  . CYSTO/  HYDRODISTENTION/  INSTILLATION THERAPY  1997 approx  . HYSTEROSCOPY W/D&C  11/01/2004   POLYPECTOMY  . IR GASTROSTOMY TUBE REMOVAL  03/20/2017  . IR GENERIC HISTORICAL  01/26/2017   IR GASTROSTOMY TUBE MOD SED 01/26/2017 Sandi Mariscal, MD [facility]INTER[facility]  . LAPAROSCOPIC VAGINAL HYSTERECTOMY  WITH SALPINGO OOPHORECTOMY Bilateral 04/20/2012  . TUBAL LIGATION  yrs ago    There were no vitals filed for this visit.      Subjective Assessment - 04/14/17 1256    Pertinent History hx of back pain (went to pain management center)   Currently in Pain? No/denies         Treatment: supine ball ex for chest press and shoulder flexion, min v.c. For shoulder positioning LUE shoulder flexion with hemiglide and shoulder flexion with elbow extension pushing pole forwards and backwards, min facilitation Tall kneeling to roll ball forwards and backwards, min facilitation for core stability/ UE strength Prone on elbows lifting chest x 10 reps, min v.c  Low range AA/ROM with RUE on hemiglide, min-mod facilitation Supine shoulder flexion with yellow band performing shoulder abduction small range at various heights, then with gentle resistance throughout range                       OT Short Term Goals - 03/19/17 1457      OT SHORT TERM GOAL #1   Title Patient will complete a home activity program daily with min prompting or set up --check STGs 03/27/17   Status Achieved     OT SHORT TERM GOAL #2   Title Patient will complete a home exercise program designed to improve range of  motion in BUE's with no more than set up assistance   Status Achieved     OT SHORT TERM GOAL #3   Title Patient will utilize bilateral upper extremities to cut a piece of meat during a meal with modified independence   Status Achieved     OT SHORT TERM GOAL #4   Title Patient will don compression hose with increased time and without physical assistance   Status Achieved     OT SHORT TERM GOAL #5   Title Patient will demonstrate sufficient activity tolerance to complete light housekeeping task in standing for greater than 5 minutes.     Status Achieved     OT SHORT TERM GOAL #6   Title Patient will demonstrate effective mid level reach to retrieve lightweight item less than 2 lbs from chest  height shelf in standing using BUE's   Status Achieved           OT Long Term Goals - 04/03/17 0947      OT LONG TERM GOAL #1   Title Patient will complete an updated home exercise program designed for UE strengthening daily--check LTGs 04/26/17   Status On-going     OT LONG TERM GOAL #2   Title Patient will prepare simple hot meal for she and her husband with no rest breaks- 30 min of activity   Status Achieved     OT LONG TERM GOAL #3   Title Patient will complete laundry task- including transporting items to washer, and transferring to dryer, folding and putting away clothing with intermittent assistance only   Status Achieved     OT LONG TERM GOAL #4   Title Patient will utilize bilateral upper extremities to blow dry and style her hair with modified independence   Status On-going     OT LONG TERM GOAL #5   Title Patient will demonstrate sufficient UE/LE strength and balance to reach to floor to retrieve a lightweight (no more than 2 lb) object without loss of balance   Status On-going     OT LONG TERM GOAL #6   Title Patient will demonstrate sufficient strength for unilateral reach to retrieve a 3 lb object from overhead shelf while standing with right hand and left hand (not bilateral reach)   Status On-going     OT LONG TERM GOAL #7   Title Patient will demonstrate sufficient strength to get down to floor to clean tub, and get back up without assistance   Status On-going               Plan - 04/14/17 1257    Clinical Impression Statement Pt is progressing towards goals. She demonstrates understanding of updated HEP.   Rehab Potential Good   OT Frequency 2x / week   OT Duration 4 weeks   OT Treatment/Interventions Self-care/ADL training;Electrical Stimulation;Therapeutic exercise;Neuromuscular education;Energy conservation;Manual Therapy;Functional Mobility Training;DME and/or AE instruction;Visual/perceptual remediation/compensation;Cognitive  remediation/compensation;Therapeutic activities;Balance training;Patient/family education   Plan continue  to address UE strength/ functional use   Consulted and Agree with Plan of Care Patient      Patient will benefit from skilled therapeutic intervention in order to improve the following deficits and impairments:  Decreased coordination, Decreased range of motion, Decreased endurance, Decreased safety awareness, Decreased balance, Decreased cognition, Decreased strength, Impaired perceived functional ability, Impaired vision/preception, Impaired UE functional use, Impaired tone, Pain, Impaired sensation, Improper body mechanics  Visit Diagnosis: Muscle weakness (generalized)  Other lack of coordination  Other disturbances of skin sensation  Unsteadiness  on feet    Problem List Patient Active Problem List   Diagnosis Date Noted  . Acute blood loss anemia   . Physical deconditioning   . Tracheostomy status ([place])   . Hypoxemia   . Dysphagia   . Numbness and tingling   . S/P percutaneous endoscopic gastrostomy (PEG) tube placement ([place])   . Bradycardia   . Left corneal abrasion   . Abnormal chest x-ray   . Acute respiratory failure with hypoxia ([place])   . Guillain Barr syndrome ([place])   . Atrial fibrillation with RVR ([place])   . Dysphagia, pharyngoesophageal phase   . History of ETT   . Encounter for central line placement   . Encounter for feeding tube placement   . Weakness of right lower extremity   . SOB (shortness of breath)   . Pneumonia   . Pressure injury of skin 01/16/2017  . Acute respiratory failure ([place])   . Hyponatremia with decreased serum osmolality   . Hyponatremia   . Quadriplegia and quadriparesis ([place]) 01/14/2017  . GBS (Guillain Barre syndrome) ([place]) 01/13/2017  . Weakness 01/12/2017  . Hypokalemia 01/12/2017  . Endometriosis 12/05/2013  . Chronic low back pain 08/19/2012  . HYPERLIPIDEMIA 12/10/2010  . COLONIC POLYPS, HX OF 12/13/2009  .  HYPERGLYCEMIA, FASTING 11/22/2008  . Depression 02/07/2008  . ANOSMIA 02/07/2008  . HEADACHE, CHRONIC 02/07/2008  . MITRAL VALVE PROLAPSE 10/05/2007  . OSTEOPENIA 10/05/2007    Joanne Morrison 04/14/2017, 1:00 PM  [facility] [address] [place], [place], [address] Phone: [phone]   Fax:  [phone]  Name: Joanne Morrison MRN: [id number] Date of Birth: 01-22-1951

## 2017-04-15 ENCOUNTER — Other Ambulatory Visit: Payer: Self-pay

## 2017-04-15 MED ORDER — SERTRALINE HCL 50 MG PO TABS: 50.0000 mg | ORAL_TABLET | Freq: Every day | ORAL | 1 refills | Status: DC

## 2017-04-16 ENCOUNTER — Ambulatory Visit: Payer: BLUE CROSS/BLUE SHIELD | Admitting: Physical Therapy

## 2017-04-16 ENCOUNTER — Encounter: Payer: BLUE CROSS/BLUE SHIELD | Admitting: Occupational Therapy

## 2017-04-17 ENCOUNTER — Ambulatory Visit: Payer: BLUE CROSS/BLUE SHIELD | Admitting: Physical Therapy

## 2017-04-17 ENCOUNTER — Ambulatory Visit: Payer: BLUE CROSS/BLUE SHIELD | Admitting: Occupational Therapy

## 2017-04-17 ENCOUNTER — Encounter: Payer: BLUE CROSS/BLUE SHIELD | Admitting: Occupational Therapy

## 2017-04-17 DIAGNOSIS — R208 Other disturbances of skin sensation: Secondary | ICD-10-CM

## 2017-04-17 DIAGNOSIS — R278 Other lack of coordination: Secondary | ICD-10-CM

## 2017-04-17 DIAGNOSIS — M6281 Muscle weakness (generalized): Secondary | ICD-10-CM

## 2017-04-17 NOTE — Therapy (Signed)
[facility] [address] [place], [place], [address] Phone: [phone]   Fax:  [phone]  Occupational Therapy Treatment  Patient Details  Name: Joanne Morrison MRN: [id number] Date of Birth: [birth year] Referring Provider: Dr Naaman Plummer  Encounter Date: 04/17/2017      OT End of Session - 04/17/17 1327    Visit Number 18   Number of Visits 20   Date for OT Re-Evaluation 04/25/17   Authorization Type BCBS  no visit limit   Authorization - Visit Number 18   Authorization - Number of Visits 20   OT Start Time 2536   OT Stop Time 1015   OT Time Calculation (min) 40 min   Activity Tolerance Patient tolerated treatment well   Behavior During Therapy [facility] for tasks assessed/performed      Past Medical History:  Diagnosis Date  . Anxiety   . Arthritis    back  . Chronic cystitis   . Chronic low back pain   . Depression   . Diverticulosis of colon   . Hemorrhoids   . History of colon polyps    09-24-2009  rectal hyperplastic polyp/   and 2016 non-cancerous  . History of endometriosis   . History of panic attacks   . Hyperlipidemia   . Osteopenia   . Pelvic pain   . Sensation of pressure in bladder area   . Wears glasses     Past Surgical History:  Procedure Laterality Date  . BUNIONECTOMY Left 2004 approx  . CARPAL TUNNEL RELEASE Bilateral right 2014;  left 2007  . COLONOSCOPY W/ POLYPECTOMY  09/24/2009  . CYSTO WITH HYDRODISTENSION [name] 10/15/2016   Procedure: CYSTOSCOPY/HYDRODISTENSION AND MARCAINE INSTILLATION;  Surgeon: Rana Snare, MD;  Location: [facility];  Service: Urology;  Laterality: [name];  . CYSTO/  HYDRODISTENTION/  INSTILLATION THERAPY  1997 approx  . HYSTEROSCOPY W/D&C  11/01/2004   POLYPECTOMY  . IR GASTROSTOMY TUBE REMOVAL  03/20/2017  . IR GENERIC HISTORICAL  01/26/2017   IR GASTROSTOMY TUBE MOD SED 01/26/2017 Sandi Mariscal, MD [facility]-INTER[facility] RAD  . LAPAROSCOPIC VAGINAL HYSTERECTOMY  WITH SALPINGO OOPHORECTOMY Bilateral 04/20/2012  . TUBAL LIGATION  yrs ago    There were no vitals filed for this visit.      Subjective Assessment - 04/17/17 0932    Subjective  A little pain where I worked my arm   Pertinent History hx of back pain (went to pain management center)   Currently in Pain? Yes   Pain Score 3    Pain Location Arm   Pain Orientation Right;Left   Pain Descriptors / Indicators Sore   Pain Type Chronic pain   Pain Frequency Intermittent   Aggravating Factors  exercise   Pain Relieving Factors movement   Multiple Pain Sites No                     Treatment: supine ball ex for chest press and shoulder flexion, min v.c. For shoulder positioning LUE shoulder flexion with hemiglide and shoulder flexion with elbow extension pushing pole forwards and backwards, min facilitation Tall kneeling to roll ball forwards and backwards, min facilitation for core stability/ UE strength  kneeling sliding bilateral arms along mat in shoulder flexion, x10 reps Low range AA/ROM with RUE on hemiglide, holding pole and bringing it forward to knee then back by side for scapular retraction, min facilitation added to HEP Supine shoulder flexion with bilateral UE's with 2lbs weight as well as  chest press for strengthening 5-10 reps at a time prior to rest,  Supine shoulder circles with left and right UE's individually holding a 1 lbs weight, 10 reps each min v.c.           OT Short Term Goals - 03/19/17 1457      OT SHORT TERM GOAL #1   Title Patient will complete a home activity program daily with min prompting or set up --check STGs 03/27/17   Status Achieved     OT [place] #2   Title Patient will complete a home exercise program designed to improve range of motion in BUE's with no more than set up assistance   Status Achieved     OT SHORT TERM GOAL #3   Title Patient will utilize bilateral upper extremities to cut a piece of meat during a meal  with modified independence   Status Achieved     OT SHORT TERM GOAL #4   Title Patient will don compression hose with increased time and without physical assistance   Status Achieved     OT SHORT TERM GOAL #5   Title Patient will demonstrate sufficient activity tolerance to complete light housekeeping task in standing for greater than 5 minutes.     Status Achieved     OT SHORT TERM GOAL #6   Title Patient will demonstrate effective mid level reach to retrieve lightweight item less than 2 lbs from chest height shelf in standing using BUE's   Status Achieved           OT Long Term Goals - 04/17/17 0957      OT LONG TERM GOAL #1   Title Patient will complete an updated home exercise program designed for UE strengthening daily--check LTGs 04/26/17   Time 8   Period Weeks   Status On-going  Pt reports performing every other day     OT LONG TERM GOAL #2   Title Patient will prepare simple hot meal for she and her husband with no rest breaks- 30 min of activity   Status Achieved     OT LONG TERM GOAL #3   Title Patient will complete laundry task- including transporting items to washer, and transferring to dryer, folding and putting away clothing with intermittent assistance only   Status Achieved     OT LONG TERM GOAL #4   Title Patient will utilize bilateral upper extremities to blow dry and style her hair with modified independence   Status On-going  requires assistance for the back of her hair from husband     OT LONG TERM GOAL #5   Title Patient will demonstrate sufficient UE/LE strength and balance to reach to floor to retrieve a lightweight (no more than 2 lb) object without loss of balance   Time 8   Period Weeks   Status On-going     OT LONG TERM GOAL #6   Title Patient will demonstrate sufficient strength for unilateral reach to retrieve a 3 lb object from overhead shelf while standing with right hand and left hand (not bilateral reach)   Time 8   Period Weeks    Status On-going     OT LONG TERM GOAL #7   Title Patient will demonstrate sufficient strength to get down to floor to clean tub, and get back up without assistance   Period Weeks   Status On-going               Plan - 04/17/17 1324  Clinical Impression Statement Pt is progressing towards goals. Anticipate renewing in the next 1-2 weeks as is progressing and can benefit from additional therapy. Pt reports today that she can see improvements.   Rehab Potential Good   OT Frequency 2x / week   OT Duration 4 weeks   OT Treatment/Interventions Self-care/ADL training;Electrical Stimulation;Therapeutic exercise;Neuromuscular education;Energy conservation;Manual Therapy;Functional Mobility Training;DME and/or AE instruction;Visual/perceptual remediation/compensation;Cognitive remediation/compensation;Therapeutic activities;Balance training;Patient/family education   Plan UE strength and functional use, low range reaching   Consulted and Agree with Plan of Care Patient;Family member/caregiver      Patient will benefit from skilled therapeutic intervention in order to improve the following deficits and impairments:  Decreased coordination, Decreased range of motion, Decreased endurance, Decreased safety awareness, Decreased balance, Decreased cognition, Decreased strength, Impaired perceived functional ability, Impaired vision/preception, Impaired UE functional use, Impaired tone, Pain, Impaired sensation, Improper body mechanics  Visit Diagnosis: Other lack of coordination  Other disturbances of skin sensation  Muscle weakness (generalized)    Problem List Patient Active Problem List   Diagnosis Date Noted  . Acute blood loss anemia   . Physical deconditioning   . Tracheostomy status ([place])   . Hypoxemia   . Dysphagia   . Numbness and tingling   . S/P percutaneous endoscopic gastrostomy (PEG) tube placement ([place])   . Bradycardia   . Left corneal abrasion   . Abnormal chest  x-ray   . Acute respiratory failure with hypoxia ([place])   . Guillain Barr syndrome ([place])   . Atrial fibrillation with RVR ([place])   . Dysphagia, pharyngoesophageal phase   . History of ETT   . Encounter for central line placement   . Encounter for feeding tube placement   . Weakness of right lower extremity   . SOB (shortness of breath)   . Pneumonia   . Pressure injury of skin 01/16/2017  . Acute respiratory failure ([place])   . Hyponatremia with decreased serum osmolality   . Hyponatremia   . Quadriplegia and quadriparesis ([place]) 01/14/2017  . GBS (Guillain Barre syndrome) ([place]) 01/13/2017  . Weakness 01/12/2017  . Hypokalemia 01/12/2017  . Endometriosis 12/05/2013  . Chronic low back pain 08/19/2012  . HYPERLIPIDEMIA 12/10/2010  . COLONIC POLYPS, HX OF 12/13/2009  . HYPERGLYCEMIA, FASTING 11/22/2008  . Depression 02/07/2008  . ANOSMIA 02/07/2008  . HEADACHE, CHRONIC 02/07/2008  . MITRAL VALVE PROLAPSE 10/05/2007  . OSTEOPENIA 10/05/2007    Linzie Criss 04/17/2017, 1:28 PM Theone Murdoch, OTR/L [phone] Phone: [phone] 1:33 PM 04/17/17 [facility] [address] [place], [place], [address] Phone: [phone]   Fax:  [phone]  Name: Kym Scannell MRN: [id number] Date of Birth: 05-19-1951

## 2017-04-20 ENCOUNTER — Ambulatory Visit: Payer: BLUE CROSS/BLUE SHIELD | Admitting: Physical Therapy

## 2017-04-20 ENCOUNTER — Encounter: Payer: BLUE CROSS/BLUE SHIELD | Admitting: Speech Pathology

## 2017-04-20 ENCOUNTER — Ambulatory Visit: Payer: BLUE CROSS/BLUE SHIELD | Admitting: Occupational Therapy

## 2017-04-20 DIAGNOSIS — R278 Other lack of coordination: Secondary | ICD-10-CM

## 2017-04-20 DIAGNOSIS — M6281 Muscle weakness (generalized): Secondary | ICD-10-CM | POA: Diagnosis not present

## 2017-04-20 DIAGNOSIS — R208 Other disturbances of skin sensation: Secondary | ICD-10-CM

## 2017-04-20 DIAGNOSIS — R2681 Unsteadiness on feet: Secondary | ICD-10-CM

## 2017-04-20 NOTE — Therapy (Signed)
[facility] [address] [place], [place], [address] Phone: [phone]   Fax:  [phone]  Occupational Therapy Treatment  Patient Details  Name: Joanne Morrison MRN: [id number] Date of Birth: 02-02-1951 Referring Provider: Dr Naaman Plummer  Encounter Date: 04/20/2017      OT End of Session - 04/20/17 1040    Visit Number 19   Number of Visits 20   Date for OT Re-Evaluation 04/25/17   Authorization Type BCBS  no visit limit   Authorization - Visit Number 19   Authorization - Number of Visits 20   OT Start Time 0850   OT Stop Time 0930   OT Time Calculation (min) 40 min   Activity Tolerance Patient tolerated treatment well   Behavior During Therapy [facility] for tasks assessed/performed      Past Medical History:  Diagnosis Date  . Anxiety   . Arthritis    back  . Chronic cystitis   . Chronic low back pain   . Depression   . Diverticulosis of colon   . Hemorrhoids   . History of colon polyps    09-24-2009  rectal hyperplastic polyp/   and 2016 non-cancerous  . History of endometriosis   . History of panic attacks   . Hyperlipidemia   . Osteopenia   . Pelvic pain   . Sensation of pressure in bladder area   . Wears glasses     Past Surgical History:  Procedure Laterality Date  . BUNIONECTOMY Left 2004 approx  . CARPAL TUNNEL RELEASE Bilateral right 2014;  left 2007  . COLONOSCOPY W/ POLYPECTOMY  09/24/2009  . CYSTO WITH HYDRODISTENSION [name] 10/15/2016   Procedure: CYSTOSCOPY/HYDRODISTENSION AND MARCAINE INSTILLATION;  Surgeon: Rana Snare, MD;  Location: [facility];  Service: Urology;  Laterality: [name];  . CYSTO/  HYDRODISTENTION/  INSTILLATION THERAPY  1997 approx  . HYSTEROSCOPY W/D&C  11/01/2004   POLYPECTOMY  . IR GASTROSTOMY TUBE REMOVAL  03/20/2017  . IR GENERIC HISTORICAL  01/26/2017   IR GASTROSTOMY TUBE MOD SED 01/26/2017 Sandi Mariscal, MD [facility]INTER[facility]  . LAPAROSCOPIC VAGINAL HYSTERECTOMY  WITH SALPINGO OOPHORECTOMY Bilateral 04/20/2012  . TUBAL LIGATION  yrs ago    There were no vitals filed for this visit.      Subjective Assessment - 04/20/17 1041    Subjective  A little pain where I worked my arms doing w/c pushups   Pertinent History hx of back pain (went to pain management center)   Currently in Pain? Yes   Pain Score 3    Pain Location Arm   Pain Orientation Right;Left   Pain Descriptors / Indicators Sore   Pain Type Chronic pain   Pain Frequency Intermittent   Aggravating Factors  overdoing it   Pain Relieving Factors movement   Multiple Pain Sites No               Treatment: supine ball exercises for shoulder flexion and chest press, 10 reps each min v.c Tall kneeling to roll ball forwards and backwards x 10 reps, min v.c. For positioing,  Prone scapular stability exercise with arms in extension x 10 reps min v.c Functional reaching with bilateral UE's to place graded clothespins on vertical antennae, low range with RUE, mid range with LUE with emphasis on proper positioning, min v.c./ facilitation                 OT Short Term Goals - 04/20/17 1038      OT  SHORT TERM GOAL #1   Title Patient will complete a home activity program daily with min prompting or set up --check STGs 03/27/17   Status Achieved     OT SHORT TERM GOAL #2   Title Patient will complete a home exercise program designed to improve range of motion in BUE's with no more than set up assistance   Status Achieved     OT SHORT TERM GOAL #3   Title Patient will utilize bilateral upper extremities to cut a piece of meat during a meal with modified independence   Status Achieved     OT SHORT TERM GOAL #4   Title Patient will don compression hose with increased time and without physical assistance   Status Achieved     OT SHORT TERM GOAL #5   Title Patient will demonstrate sufficient activity tolerance to complete light housekeeping task in standing for greater  than 5 minutes.     Status Achieved     OT SHORT TERM GOAL #6   Title Patient will demonstrate effective mid level reach to retrieve lightweight item less than 2 lbs from chest height shelf in standing using BUE's   Status Achieved           OT Long Term Goals - 04/20/17 0916      OT LONG TERM GOAL #1   Title Patient will complete an updated home exercise program designed for UE strengthening daily--check LTGs 04/26/17   Time 8   Period Weeks   Status On-going  Pt reports performing every other day     OT LONG TERM GOAL #2   Title Patient will prepare simple hot meal for she and her husband with no rest breaks- 30 min of activity   Status Achieved     OT LONG TERM GOAL #3   Title Patient will complete laundry task- including transporting items to washer, and transferring to dryer, folding and putting away clothing with intermittent assistance only   Status Achieved     OT LONG TERM GOAL #4   Title Patient will utilize bilateral upper extremities to blow dry and style her hair with modified independence   Status On-going  requires assistance for the back of her hair from husband     OT [place] #5   Title Patient will demonstrate sufficient UE/LE strength and balance to reach to floor to retrieve a lightweight (no more than 2 lb) object without loss of balance   Time 8   Period Weeks   Status Achieved     OT LONG TERM GOAL #6   Title Patient will demonstrate sufficient strength for unilateral reach to retrieve a 3 lb object from overhead shelf while standing with right hand and left hand (not bilateral reach)   Time 8   Period Weeks   Status Partially Met  able to retrieve however pt demonstrates mod compensations with RUE     OT LONG TERM GOAL #7   Title Patient will demonstrate sufficient strength to get down to floor to clean tub, and get back up without assistance   Period Weeks   Status On-going               Plan - 04/20/17 1034    Clinical  Impression Statement Pt is progressing towards goals. She demonstrates improving UE strength and functional use.   Rehab Potential Good   OT Frequency 2x / week   OT Duration 4 weeks   OT Treatment/Interventions Self-care/ADL training;Electrical  Stimulation;Therapeutic exercise;Neuromuscular education;Energy conservation;Manual Therapy;Functional Mobility Training;DME and/or AE instruction;Visual/perceptual remediation/compensation;Cognitive remediation/compensation;Therapeutic activities;Balance training;Patient/family education   Plan low range reaching, continue strength   OT Home Exercise Plan Added to HEP - shoulder strengthening   Consulted and Agree with Plan of Care Patient;Family member/caregiver      Patient will benefit from skilled therapeutic intervention in order to improve the following deficits and impairments:  Decreased coordination, Decreased range of motion, Decreased endurance, Decreased safety awareness, Decreased balance, Decreased cognition, Decreased strength, Impaired perceived functional ability, Impaired vision/preception, Impaired UE functional use, Impaired tone, Pain, Impaired sensation, Improper body mechanics  Visit Diagnosis: Other lack of coordination  Other disturbances of skin sensation  Muscle weakness (generalized)  Unsteadiness on feet    Problem List Patient Active Problem List   Diagnosis Date Noted  . Acute blood loss anemia   . Physical deconditioning   . Tracheostomy status ([place])   . Hypoxemia   . Dysphagia   . Numbness and tingling   . S/P percutaneous endoscopic gastrostomy (PEG) tube placement ([place])   . Bradycardia   . Left corneal abrasion   . Abnormal chest x-ray   . Acute respiratory failure with hypoxia ([place])   . Guillain Barr syndrome ([place])   . Atrial fibrillation with RVR ([place])   . Dysphagia, pharyngoesophageal phase   . History of ETT   . Encounter for central line placement   . Encounter for feeding tube placement    . Weakness of right lower extremity   . SOB (shortness of breath)   . Pneumonia   . Pressure injury of skin 01/16/2017  . Acute respiratory failure ([place])   . Hyponatremia with decreased serum osmolality   . Hyponatremia   . Quadriplegia and quadriparesis ([place]) 01/14/2017  . GBS (Guillain Barre syndrome) ([place]) 01/13/2017  . Weakness 01/12/2017  . Hypokalemia 01/12/2017  . Endometriosis 12/05/2013  . Chronic low back pain 08/19/2012  . HYPERLIPIDEMIA 12/10/2010  . COLONIC POLYPS, HX OF 12/13/2009  . HYPERGLYCEMIA, FASTING 11/22/2008  . Depression 02/07/2008  . ANOSMIA 02/07/2008  . HEADACHE, CHRONIC 02/07/2008  . MITRAL VALVE PROLAPSE 10/05/2007  . OSTEOPENIA 10/05/2007    Oline Belk 04/20/2017, 10:42 AM  [facility] [address] [place], [place], [address] Phone: [phone]   Fax:  [phone]  Name: Caylah Plouff MRN: [id number] Date of Birth: July 01, 1951

## 2017-04-21 ENCOUNTER — Ambulatory Visit: Payer: BLUE CROSS/BLUE SHIELD | Admitting: Physical Therapy

## 2017-04-21 ENCOUNTER — Encounter: Payer: BLUE CROSS/BLUE SHIELD | Admitting: Occupational Therapy

## 2017-04-22 ENCOUNTER — Encounter
Payer: BLUE CROSS/BLUE SHIELD | Attending: Physical Medicine & Rehabilitation | Admitting: Physical Medicine & Rehabilitation

## 2017-04-22 ENCOUNTER — Encounter: Payer: Self-pay | Admitting: Physical Medicine & Rehabilitation

## 2017-04-22 VITALS — BP 136/92 | HR 71 | Resp 14

## 2017-04-22 DIAGNOSIS — Z79899 Other long term (current) drug therapy: Secondary | ICD-10-CM | POA: Diagnosis not present

## 2017-04-22 DIAGNOSIS — R001 Bradycardia, unspecified: Secondary | ICD-10-CM | POA: Insufficient documentation

## 2017-04-22 DIAGNOSIS — I4891 Unspecified atrial fibrillation: Secondary | ICD-10-CM | POA: Diagnosis not present

## 2017-04-22 DIAGNOSIS — Z8249 Family history of ischemic heart disease and other diseases of the circulatory system: Secondary | ICD-10-CM | POA: Insufficient documentation

## 2017-04-22 DIAGNOSIS — M545 Low back pain: Secondary | ICD-10-CM | POA: Insufficient documentation

## 2017-04-22 DIAGNOSIS — F419 Anxiety disorder, unspecified: Secondary | ICD-10-CM | POA: Diagnosis not present

## 2017-04-22 DIAGNOSIS — Z931 Gastrostomy status: Secondary | ICD-10-CM | POA: Insufficient documentation

## 2017-04-22 DIAGNOSIS — F329 Major depressive disorder, single episode, unspecified: Secondary | ICD-10-CM | POA: Insufficient documentation

## 2017-04-22 DIAGNOSIS — Z8262 Family history of osteoporosis: Secondary | ICD-10-CM | POA: Insufficient documentation

## 2017-04-22 DIAGNOSIS — G8929 Other chronic pain: Secondary | ICD-10-CM | POA: Insufficient documentation

## 2017-04-22 DIAGNOSIS — H02206 Unspecified lagophthalmos left eye, unspecified eyelid: Secondary | ICD-10-CM | POA: Insufficient documentation

## 2017-04-22 DIAGNOSIS — M8580 Other specified disorders of bone density and structure, unspecified site: Secondary | ICD-10-CM | POA: Diagnosis not present

## 2017-04-22 DIAGNOSIS — G8250 Quadriplegia, unspecified: Secondary | ICD-10-CM | POA: Insufficient documentation

## 2017-04-22 DIAGNOSIS — G610 Guillain-Barre syndrome: Secondary | ICD-10-CM | POA: Diagnosis not present

## 2017-04-22 DIAGNOSIS — L299 Pruritus, unspecified: Secondary | ICD-10-CM | POA: Diagnosis not present

## 2017-04-22 DIAGNOSIS — M75102 Unspecified rotator cuff tear or rupture of left shoulder, not specified as traumatic: Secondary | ICD-10-CM | POA: Diagnosis not present

## 2017-04-22 DIAGNOSIS — M75101 Unspecified rotator cuff tear or rupture of right shoulder, not specified as traumatic: Secondary | ICD-10-CM

## 2017-04-22 DIAGNOSIS — E785 Hyperlipidemia, unspecified: Secondary | ICD-10-CM | POA: Diagnosis not present

## 2017-04-22 MED ORDER — DICLOFENAC SODIUM 75 MG PO TBEC: 75.0000 mg | DELAYED_RELEASE_TABLET | Freq: Two times a day (BID) | ORAL | 3 refills | Status: DC

## 2017-04-22 NOTE — Progress Notes (Addendum)
Subjective:    Patient ID: Joanne Morrison, female    DOB: [birth year], 66 y.o.   MRN: [id number]  HPI   Nahjae is here in follow up of her GBS. She is improving from a pain standpoint but still has tingling in her toes with associated pain at night time. She also has left and right shoulder pain which affects her sleep. Her sleep is pretty interrupted, as she tends to toss and turn. Her right arm is also restless at night.   She has come off fentanyl and has done well. She didn't tolerate tramadol which caused profuse sweating. She is largely using ibuprofen for breakthrough pain.    Pain Inventory Average Pain 4 Pain Right Now 4 My pain is dull  In the last 24 hours, has pain interfered with the following? General activity 10 Relation with others 10 Enjoyment of life 10 What TIME of day is your pain at its worst? night Sleep (in general) Fair  Pain is worse with: bending and standing Pain improves with: rest and pacing activities Relief from Meds: n/a  Mobility walk without assistance walk with assistance how many minutes can you walk? 30 ability to climb steps?  yes do you drive?  no  Function not employed: date last employed . disabled: date disabled . retired I need assistance with the following:  household duties and shopping Do you have any goals in this area?  yes  Neuro/Psych weakness numbness tingling spasms confusion depression anxiety  Prior Studies Any changes since last visit?  no  Physicians involved in your care Any changes since last visit?  no   Family History  Problem Relation Age of Onset  . Hypertension Mother   . Dementia Mother   . Lung cancer Mother     smoker  . Osteoporosis Mother   . Heart attack Father 3  . Hypertension Brother   . Pulmonary embolism Brother 60    ?etilology  . Diabetes Maternal Aunt   . Cancer Maternal Uncle     RENAL  . Heart attack Maternal Grandmother 86  . Lung cancer Maternal Grandfather     Ecologist  . Anesthesia problems Neg Hx   . Stroke Neg Hx    Social History   Social History  . Marital status: Married    Spouse name: [name] Resides  . Number of children: 1  . Years of education: 65   Social History Main Topics  . Smoking status: Never Smoker  . Smokeless tobacco: Never Used  . Alcohol use No  . Drug use: No  . Sexual activity: Yes    Birth control/ protection: None   Other Topics Concern  . None   Social History Narrative   Lives w/ husband   Caffeine use: 2 drinks per month    Right handed   Past Surgical History:  Procedure Laterality Date  . BUNIONECTOMY Left 2004 approx  . CARPAL TUNNEL RELEASE Bilateral right 2014;  left 2007  . COLONOSCOPY W/ POLYPECTOMY  09/24/2009  . CYSTO WITH HYDRODISTENSION [name] 10/15/2016   Procedure: CYSTOSCOPY/HYDRODISTENSION AND MARCAINE INSTILLATION;  Surgeon: Rana Snare, MD;  Location: [facility];  Service: Urology;  Laterality: [name];  . CYSTO/  HYDRODISTENTION/  INSTILLATION THERAPY  1997 approx  . HYSTEROSCOPY W/D&C  11/01/2004   POLYPECTOMY  . IR GASTROSTOMY TUBE REMOVAL  03/20/2017  . IR GENERIC HISTORICAL  01/26/2017   IR GASTROSTOMY TUBE MOD SED 01/26/2017 Sandi Mariscal, MD [facility]  . LAPAROSCOPIC VAGINAL  HYSTERECTOMY WITH SALPINGO OOPHORECTOMY Bilateral 04/20/2012  . TUBAL LIGATION  yrs ago   Past Medical History:  Diagnosis Date  . Anxiety   . Arthritis    back  . Chronic cystitis   . Chronic low back pain   . Depression   . Diverticulosis of colon   . Hemorrhoids   . History of colon polyps    09-24-2009  rectal hyperplastic polyp/   and 2016 non-cancerous  . History of endometriosis   . History of panic attacks   . Hyperlipidemia   . Osteopenia   . Pelvic pain   . Sensation of pressure in bladder area   . Wears glasses    BP (!) 136/92 (BP Location: Right Arm, Patient Position: Sitting, Cuff Size: Normal)   Pulse 71   Resp 14   SpO2 98%   Opioid Risk Score:   Fall Risk Score:   `1  Depression screen PHQ 2/9  Depression screen [facility] 2/9 03/04/2017 12/05/2013  Decreased Interest 3 0  Down, Depressed, Hopeless 3 0  PHQ - 2 Score 6 0  Altered sleeping 2 -  Tired, decreased energy 3 -  Change in appetite 0 -  Feeling bad or failure about yourself  0 -  Trouble concentrating 3 -  Moving slowly or fidgety/restless 3 -  Suicidal thoughts 0 -  PHQ-9 Score 17 -  Difficult doing work/chores Extremely dIfficult -    Review of Systems  Constitutional: Positive for appetite change and diaphoresis.  HENT: Negative.   Eyes: Negative.   Respiratory: Negative.   Cardiovascular: Negative.   Endocrine: Negative.   Genitourinary: Positive for difficulty urinating.  Musculoskeletal: Positive for arthralgias and myalgias.  Skin: Negative.   Allergic/Immunologic: Negative.   Neurological: Positive for weakness and numbness.       Tingling  Hematological: Negative.   Psychiatric/Behavioral: Positive for confusion and dysphoric mood. The patient is nervous/anxious.        Objective:   Physical Exam Constitutional: She appears well-developed and well-nourished. NAD.  HENT: Normocephalic. Atraumatic.  Eyes: Left lid with attached weight. Can almost close. Sclera generally white Neck: trach scar Cardiovascular: RRR Respiratory: CTA B  GI: Soft. Bowel sounds are normal.  Musculoskeletal: pain with IR/ER bilateral shoulders, +impingement signs Neurological: She is alert and oriented.  Motor:  LUE: 4+/5 proximally, 4+/5 distally  RUE: 4+/5  Decreased sensation over toes which is resolving  Mild left facial droop. Speech clear. Lid almost closes Gait nearly normal now. Tandem gait with little LOB Skin: Skin is warm and dry.  Psychiatric: Normal mood and behavior.        Assessment & Plan:  Medical Problem List and Plan: 1. 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               -pt is making nice gains             -continue with therapy addressing  balance, upper extremity strengthening, oral-facial control, etc 2. Neuropathic pain/bilateral RTC syndrome  -reviewed the importance of HEP/therapy to strengthen shoulders and address shoulder mechanics  -should avoid sleeping on shoulders when possible             -off fentanyl patch   -begin a trail of voltaren 75mg  BID with food  -consider tricyclic for nerve pain 3. Mood: Zoloft 50 mg daily, Ativan as needed 3. Atrial fibrillation/bradycardia. HR controlled 4. 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 Underwent tarorhaphy 02/04/2017. Continue erythromycin ointment for now follow-up ophthalmology  services as needed,             -follow up per optho             -discussed options to help protect eye while sleeping 5. Pruritus: likely fentanyl related--continue to wean to off             -can use benadryl prn---this should resolve completely  Fifteen minutes of face to face patient care time were spent during this visit. All questions were encouraged and answered.  Greater than 50% of time during this encounter was spent counseling patient/family in regard to gait and shoulder mechanics, pain control, GBS recovery.

## 2017-04-22 NOTE — Patient Instructions (Addendum)
Continue with your home exercise program. Make sure you're using appropriate form and mechanics when you exercise.   See how your pain is at night with arm/feet/shoulders over the next couple weeks. If you're still having problems call me and we can consider something else for the pain.    PLEASE FEEL FREE TO CALL OUR OFFICE WITH ANY PROBLEMS OR QUESTIONS ([phone])

## 2017-04-23 ENCOUNTER — Ambulatory Visit: Payer: BLUE CROSS/BLUE SHIELD | Admitting: Physical Therapy

## 2017-04-23 ENCOUNTER — Ambulatory Visit: Payer: BLUE CROSS/BLUE SHIELD | Admitting: Occupational Therapy

## 2017-04-23 ENCOUNTER — Encounter: Payer: BLUE CROSS/BLUE SHIELD | Admitting: Speech Pathology

## 2017-04-23 DIAGNOSIS — R208 Other disturbances of skin sensation: Secondary | ICD-10-CM

## 2017-04-23 DIAGNOSIS — M6281 Muscle weakness (generalized): Secondary | ICD-10-CM | POA: Diagnosis not present

## 2017-04-23 DIAGNOSIS — R2681 Unsteadiness on feet: Secondary | ICD-10-CM

## 2017-04-23 DIAGNOSIS — M25512 Pain in left shoulder: Secondary | ICD-10-CM

## 2017-04-23 DIAGNOSIS — R278 Other lack of coordination: Secondary | ICD-10-CM

## 2017-04-23 NOTE — Therapy (Signed)
[facility] [address] [place], [place], [address] Phone: [phone]   Fax:  [phone]  Occupational Therapy Treatment  Patient Details  Name: Joanne Morrison MRN: [id number] Date of Birth: Sep 20, 1951 Referring Provider: Dr Naaman Plummer  Encounter Date: 04/23/2017      OT End of Session - 04/23/17 0938    Visit Number 20   Number of Visits 20   Date for OT Re-Evaluation 04/25/17   Authorization Type [employer]  no visit limit   Authorization - Visit Number 20   Authorization - Number of Visits --  unlimited   OT Start Time [phone]   OT Stop Time 1015   OT Time Calculation (min) 39 min   Activity Tolerance Patient tolerated treatment well   Behavior During Therapy [facility] for tasks assessed/performed      Past Medical History:  Diagnosis Date  . Anxiety   . Arthritis    back  . Chronic cystitis   . Chronic low back pain   . Depression   . Diverticulosis of colon   . Hemorrhoids   . History of colon polyps    09-24-2009  rectal hyperplastic polyp/   and 2016 non-cancerous  . History of endometriosis   . History of panic attacks   . Hyperlipidemia   . Osteopenia   . Pelvic pain   . Sensation of pressure in bladder area   . Wears glasses     Past Surgical History:  Procedure Laterality Date  . BUNIONECTOMY Left 2004 approx  . CARPAL TUNNEL RELEASE Bilateral right 2014;  left 2007  . COLONOSCOPY W/ POLYPECTOMY  09/24/2009  . CYSTO WITH HYDRODISTENSION [name] 10/15/2016   Procedure: CYSTOSCOPY/HYDRODISTENSION AND MARCAINE INSTILLATION;  Surgeon: Rana Snare, MD;  Location[facility];  Service: Urology;  Laterality: [name];  . CYSTO/  HYDRODISTENTION/  INSTILLATION THERAPY  1997 approx  . HYSTEROSCOPY W/D&C  11/01/2004   POLYPECTOMY  . IR GASTROSTOMY TUBE REMOVAL  03/20/2017  . IR GENERIC HISTORICAL  01/26/2017   IR GASTROSTOMY TUBE MOD SED 01/26/2017 Sandi Mariscal, MD [facility]  . LAPAROSCOPIC VAGINAL  HYSTERECTOMY WITH SALPINGO OOPHORECTOMY Bilateral 04/20/2012  . TUBAL LIGATION  yrs ago    There were no vitals filed for this visit.      Subjective Assessment - 04/23/17 1647    Currently in Pain? Yes   Pain Score 4    Pain Location Arm   Pain Orientation Left   Pain Descriptors / Indicators Aching   Pain Type Acute pain   Pain Onset 1 to 4 weeks ago   Aggravating Factors  overuse   Pain Relieving Factors repostioning           Treatment:Treatment: supine ball exercises for shoulder flexion and chest press, 10 reps each min v.c Tall kneeling to roll ball forwards and backwards x 10 reps, min v.c. For positioning,  Quadraped overball, lifting alternate UE/LE, with mod/ max faciliation Prone scapular stability exercise with arms in extension x 10 reps min v.c Supine shoulder circles with left and right UE's holding 1 lbs weight in right then left hand, 2 sets of 10 reps. Pt attempted theraband exercises in supine, after 2-3 sets pt reprts deltoid pain exercise discontinued. Pt was instructed to discontinue at home and to avoid chair pushups for the time being.                     OT Short Term Goals - 04/20/17 1038  OT SHORT TERM GOAL #1   Title Patient will complete a home activity program daily with min prompting or set up --check STGs 03/27/17   Status Achieved     OT SHORT TERM GOAL #2   Title Patient will complete a home exercise program designed to improve range of motion in BUE's with no more than set up assistance   Status Achieved     OT SHORT TERM GOAL #3   Title Patient will utilize bilateral upper extremities to cut a piece of meat during a meal with modified independence   Status Achieved     OT SHORT TERM GOAL #4   Title Patient will don compression hose with increased time and without physical assistance   Status Achieved     OT SHORT TERM GOAL #5   Title Patient will demonstrate sufficient activity tolerance to complete light  housekeeping task in standing for greater than 5 minutes.     Status Achieved     OT SHORT TERM GOAL #6   Title Patient will demonstrate effective mid level reach to retrieve lightweight item less than 2 lbs from chest height shelf in standing using BUE's   Status Achieved           OT Long Term Goals - 04/23/17 0940      OT LONG TERM GOAL #1   Title Patient will complete an updated home exercise program designed for UE strengthening daily--check LTGs 04/26/17   Time 8   Period Weeks   Status On-going  Pt reports performing every other day     OT LONG TERM GOAL #2   Title Patient will prepare simple hot meal for she and her husband with no rest breaks- 30 min of activity   Status Achieved     OT LONG TERM GOAL #3   Title Patient will complete laundry task- including transporting items to washer, and transferring to dryer, folding and putting away clothing with intermittent assistance only   Status Achieved     OT LONG TERM GOAL #4   Title Patient will utilize bilateral upper extremities to blow dry and style her hair with modified independence   Status On-going  requires assistance for the back of her hair from husband     OT [place] #5   Title Patient will demonstrate sufficient UE/LE strength and balance to reach to floor to retrieve a lightweight (no more than 2 lb) object without loss of balance   Time 8   Period Weeks   Status Achieved     OT LONG TERM GOAL #6   Title Patient will demonstrate sufficient strength for unilateral reach to retrieve a 3 lb object from overhead shelf while standing with right hand and left hand (not bilateral reach)   Time 8   Period Weeks   Status Partially Met  able to retrieve however pt demonstrates mod compensations with RUE     OT LONG TERM GOAL #7   Title Patient will demonstrate sufficient strength to get down to floor to clean tub, and get back up without assistance   Period Weeks   Status On-going  partially met                Plan - 04/23/17 1637    Clinical Impression Statement Pt is progressing towards goals. She remains limited by lateral deltoid pain in LUE.   Rehab Potential Good   OT Frequency 2x / week   OT Duration 4 weeks  OT Treatment/Interventions Self-care/ADL training;Electrical Stimulation;Therapeutic exercise;Neuromuscular education;Energy conservation;Manual Therapy;Functional Mobility Training;DME and/or AE instruction;Visual/perceptual remediation/compensation;Cognitive remediation/compensation;Therapeutic activities;Balance training;Patient/family education   Plan plan to renew next visit   Consulted and Agree with Plan of Care Patient;Family member/caregiver      Patient will benefit from skilled therapeutic intervention in order to improve the following deficits and impairments:  Decreased coordination, Decreased range of motion, Decreased endurance, Decreased safety awareness, Decreased balance, Decreased cognition, Decreased strength, Impaired perceived functional ability, Impaired vision/preception, Impaired UE functional use, Impaired tone, Pain, Impaired sensation, Improper body mechanics  Visit Diagnosis: Other lack of coordination  Other disturbances of skin sensation  Muscle weakness (generalized)  Unsteadiness on feet  Acute pain of left shoulder    Problem List Patient Active Problem List   Diagnosis Date Noted  . Bilateral rotator cuff syndrome 04/22/2017  . Acute blood loss anemia   . Physical deconditioning   . Tracheostomy status ([place])   . Hypoxemia   . Dysphagia   . Numbness and tingling   . S/P percutaneous endoscopic gastrostomy (PEG) tube placement ([place])   . Bradycardia   . Left corneal abrasion   . Abnormal chest x-ray   . Acute respiratory failure with hypoxia ([place])   . Atrial fibrillation with RVR ([place])   . Dysphagia, pharyngoesophageal phase   . History of ETT   . Encounter for central line placement   . Encounter for feeding  tube placement   . Weakness of right lower extremity   . SOB (shortness of breath)   . Pneumonia   . Pressure injury of skin 01/16/2017  . Acute respiratory failure ([place])   . Hyponatremia with decreased serum osmolality   . Hyponatremia   . Quadriplegia and quadriparesis ([place]) 01/14/2017  . GBS (Guillain Barre syndrome) ([place]) 01/13/2017  . Weakness 01/12/2017  . Hypokalemia 01/12/2017  . Endometriosis 12/05/2013  . Chronic low back pain 08/19/2012  . HYPERLIPIDEMIA 12/10/2010  . COLONIC POLYPS, HX OF 12/13/2009  . HYPERGLYCEMIA, FASTING 11/22/2008  . Depression 02/07/2008  . ANOSMIA 02/07/2008  . HEADACHE, CHRONIC 02/07/2008  . MITRAL VALVE PROLAPSE 10/05/2007  . OSTEOPENIA 10/05/2007    Kyra Laffey 04/23/2017, 4:48 PM Theone Murdoch, OTR/L [phone] Phone: [phone] 4:48 PM 04/23/17 [facility] [address] [place], [place], [address] Phone: [phone]   Fax:  [phone]  Name: Joanne Morrison MRN: [id number] Date of Birth: 12/01/1951

## 2017-04-27 ENCOUNTER — Encounter: Payer: BLUE CROSS/BLUE SHIELD | Admitting: Occupational Therapy

## 2017-04-27 ENCOUNTER — Ambulatory Visit: Payer: BLUE CROSS/BLUE SHIELD | Admitting: Physical Therapy

## 2017-04-27 ENCOUNTER — Encounter: Payer: BLUE CROSS/BLUE SHIELD | Admitting: Speech Pathology

## 2017-04-28 ENCOUNTER — Ambulatory Visit: Payer: BLUE CROSS/BLUE SHIELD | Admitting: Physical Therapy

## 2017-04-28 ENCOUNTER — Encounter: Payer: BLUE CROSS/BLUE SHIELD | Admitting: Speech Pathology

## 2017-04-28 ENCOUNTER — Encounter: Payer: BLUE CROSS/BLUE SHIELD | Admitting: Occupational Therapy

## 2017-04-29 ENCOUNTER — Ambulatory Visit: Payer: BLUE CROSS/BLUE SHIELD | Admitting: Occupational Therapy

## 2017-04-29 DIAGNOSIS — M6281 Muscle weakness (generalized): Secondary | ICD-10-CM

## 2017-04-29 DIAGNOSIS — R278 Other lack of coordination: Secondary | ICD-10-CM

## 2017-04-29 DIAGNOSIS — R2681 Unsteadiness on feet: Secondary | ICD-10-CM

## 2017-04-29 DIAGNOSIS — R2689 Other abnormalities of gait and mobility: Secondary | ICD-10-CM

## 2017-04-29 DIAGNOSIS — M25512 Pain in left shoulder: Secondary | ICD-10-CM

## 2017-04-29 DIAGNOSIS — R208 Other disturbances of skin sensation: Secondary | ICD-10-CM

## 2017-04-30 ENCOUNTER — Encounter: Payer: BLUE CROSS/BLUE SHIELD | Admitting: Speech Pathology

## 2017-04-30 ENCOUNTER — Ambulatory Visit: Payer: BLUE CROSS/BLUE SHIELD | Admitting: Physical Therapy

## 2017-04-30 ENCOUNTER — Encounter: Payer: BLUE CROSS/BLUE SHIELD | Admitting: Occupational Therapy

## 2017-04-30 NOTE — Therapy (Signed)
[facility] [address] [place], [place], [address] Phone: [phone]   Fax:  [phone]  Occupational Therapy Treatment  Patient Details  Name: Joanne Morrison MRN: [id number] Date of Birth: 07-Sep-1951 Referring Provider: Dr Naaman Plummer  Encounter Date: 04/29/2017      OT End of Session - 04/30/17 1334    Visit Number 21   Number of Visits 20   Date for OT Re-Evaluation 04/25/17   Authorization Type BCBS  no visit limit   Authorization - Visit Number 21   Authorization - Number of Visits --  unlimited   OT Start Time 0805   OT Stop Time 0845   OT Time Calculation (min) 40 min   Activity Tolerance Patient tolerated treatment well   Behavior During Therapy [facility] for tasks assessed/performed      Past Medical History:  Diagnosis Date  . Anxiety   . Arthritis    back  . Chronic cystitis   . Chronic low back pain   . Depression   . Diverticulosis of colon   . Hemorrhoids   . History of colon polyps    09-24-2009  rectal hyperplastic polyp/   and 2016 non-cancerous  . History of endometriosis   . History of panic attacks   . Hyperlipidemia   . Osteopenia   . Pelvic pain   . Sensation of pressure in bladder area   . Wears glasses     Past Surgical History:  Procedure Laterality Date  . BUNIONECTOMY Left 2004 approx  . CARPAL TUNNEL RELEASE Bilateral right 2014;  left 2007  . COLONOSCOPY W/ POLYPECTOMY  09/24/2009  . CYSTO WITH HYDRODISTENSION [name] 10/15/2016   Procedure: CYSTOSCOPY/HYDRODISTENSION AND MARCAINE INSTILLATION;  Surgeon: Rana Snare, MD;  Location[facility];  Service: Urology;  Laterality: [name];  . CYSTO/  HYDRODISTENTION/  INSTILLATION THERAPY  1997 approx  . HYSTEROSCOPY W/D&C  11/01/2004   POLYPECTOMY  . IR GASTROSTOMY TUBE REMOVAL  03/20/2017  . IR GENERIC HISTORICAL  01/26/2017   IR GASTROSTOMY TUBE MOD SED 01/26/2017 Sandi Mariscal, MD [facility]-INTERV RAD  . LAPAROSCOPIC VAGINAL  HYSTERECTOMY WITH SALPINGO OOPHORECTOMY Bilateral 04/20/2012  . TUBAL LIGATION  yrs ago    There were no vitals filed for this visit.      Subjective Assessment - 04/29/17 1645    Pertinent History hx of back pain (went to pain management center)   Currently in Pain? Yes   Pain Score 1    Pain Location Arm   Pain Orientation Left   Pain Descriptors / Indicators Aching   Pain Type Acute pain   Pain Onset 1 to 4 weeks ago   Pain Frequency Intermittent   Aggravating Factors  overuse   Pain Relieving Factors repositioning   Multiple Pain Sites No            Treatment:Treatment: supine ball exercises for shoulder flexion and chest press, 10 reps each min v.c Tall kneeling to roll ball forwards and backwards x 10 reps, min v.c. For positioning,  Quadraped overball, lifting alternate UE, with mod faciliation then lifting alternate LE, mod facilitation Prone scapular stability exercise with arms in extension x 10 reps min v.c Supine shoulder circles with left and right UE's holding 1 lbs weight in right then left hand, 2 sets of 10 reps. Seated AA/ROM shoulder flexion with RUE low range pushing tilted rod forwards and backwards, hemiglide for LUE shoulder flexion mid range  min facilitation.. Biceps curls in seated with  1 lbs weight for LUE, no weight for RUE, x 10 reps each, min v.c.                     OT Short Term Goals - 04/30/17 1335      OT SHORT TERM GOAL #1   Title Patient will complete a home activity program daily with min prompting or set up --check STGs 03/27/17   Status Achieved     OT SHORT TERM GOAL #2   Title Patient will complete a home exercise program designed to improve range of motion in BUE's with no more than set up assistance   Status Achieved     OT SHORT TERM GOAL #3   Title Patient will utilize bilateral upper extremities to cut a piece of meat during a meal with modified independence   Status Achieved     OT SHORT TERM GOAL #4    Title Patient will don compression hose with increased time and without physical assistance   Status Achieved     OT SHORT TERM GOAL #5   Title Patient will demonstrate sufficient activity tolerance to complete light housekeeping task in standing for greater than 5 minutes.     Status Achieved     OT SHORT TERM GOAL #6   Title Patient will demonstrate effective mid level reach to retrieve lightweight item less than 2 lbs from chest height shelf in standing using BUE's   Status Achieved     OT SHORT TERM GOAL #7   Title I with updated HEP- due 05/29/17   Time 4   Period Weeks   Status New     OT SHORT TERM GOAL #8   Title Pt will demonstrate RUE shoulder flexion to retrieve a lightweight object at 60 with only minimal compensations/ winging.   Baseline 40* prior to compensations/ winging   Time 4   Period Weeks   Status New     OT SHORT TERM GOAL  #9   TITLE Pt will demonstrate ability to retrieve a lightweight object at 110 shoulder flexion with LUE without reports of pain.   Baseline pain with shoulder flexion greater than 100*   Time 4   Period Weeks   Status New           OT Long Term Goals - 04/30/17 1335      OT LONG TERM GOAL #1   Title Patient will complete an updated home exercise program designed for UE strengthening daily--check LTGs 04/26/17   Time 8   Period Weeks   Status Partially Met  Pt reports performing every other day     OT LONG TERM GOAL #2   Title Patient will prepare simple hot meal for she and her husband with no rest breaks- 30 min of activity   Status Achieved     OT LONG TERM GOAL #3   Title Patient will complete laundry task- including transporting items to washer, and transferring to dryer, folding and putting away clothing with intermittent assistance only   Status Achieved     OT LONG TERM GOAL #4   Title Patient will utilize bilateral upper extremities to blow dry and style her hair with modified independence   Time 8   Period  Weeks   Status On-going  requires assistance for the back of her hair from husband     OT LONG TERM GOAL #5   Title Patient will demonstrate sufficient UE/LE strength and balance to reach to  floor to retrieve a lightweight (no more than 2 lb) object without loss of balance   Time --   Period --   Status Achieved     OT LONG TERM GOAL #6   Title Patient will demonstrate sufficient strength for unilateral reach to retrieve a 3 lb object from overhead shelf while standing with right hand and left hand (not bilateral reach)   Time --   Period --   Status Partially Met  able to retrieve however pt demonstrates mod compensations with RUE     OT LONG TERM GOAL #7   Title Patient will demonstrate sufficient strength to get down to floor to clean tub, and get back up without assistance- LTG's due 06/27/17   Time 8   Period Weeks   Status On-going  partially met     OT LONG TERM GOAL #8   Title Pt will demonstrate ability to retrieve a 3 lbs weight from overhead shelf with RUE with no more than min compensations.   Baseline mod compensations retrieveing 3lbs weight from overhead shelf with RUE   Time 8   Period Weeks   Status New     OT LONG TERM GOAL  #9   Baseline Pt will resume performance of mod complex cooking and home management tasks at a modified independent level.   Time 8   Period Weeks   Status New               Plan - 04/30/17 1348    Clinical Impression Statement Pt demonstrates good overall progress towards towals. Her performance of ADLS/IADLS continues to be limited by dereased UE strength, endurance and pain. Pt can benefit from skilled occupational therapy to maximize pt's functional indpdnence and to improve quality of life.   Rehab Potential Good   OT Frequency 2x / week   OT Duration 4 weeks   OT Treatment/Interventions Self-care/ADL training;Electrical Stimulation;Therapeutic exercise;Neuromuscular education;Energy conservation;Manual Therapy;Functional  Mobility Training;DME and/or AE instruction;Visual/perceptual remediation/compensation;Cognitive remediation/compensation;Therapeutic activities;Balance training;Patient/family education   Plan work towards updated goals, gentle strengthing, UE functional use.   Consulted and Agree with Plan of Care Patient;Family member/caregiver      Patient will benefit from skilled therapeutic intervention in order to improve the following deficits and impairments:  Decreased coordination, Decreased range of motion, Decreased endurance, Decreased safety awareness, Decreased balance, Decreased cognition, Decreased strength, Impaired perceived functional ability, Impaired vision/preception, Impaired UE functional use, Impaired tone, Pain, Impaired sensation, Improper body mechanics  Visit Diagnosis: Other lack of coordination  Other disturbances of skin sensation  Muscle weakness (generalized)  Unsteadiness on feet  Acute pain of left shoulder  Other abnormalities of gait and mobility    Problem List Patient Active Problem List   Diagnosis Date Noted  . Bilateral rotator cuff syndrome 04/22/2017  . Acute blood loss anemia   . Physical deconditioning   . Tracheostomy status ([place])   . Hypoxemia   . Dysphagia   . Numbness and tingling   . S/P percutaneous endoscopic gastrostomy (PEG) tube placement ([place])   . Bradycardia   . Left corneal abrasion   . Abnormal chest x-ray   . Acute respiratory failure with hypoxia ([place])   . Atrial fibrillation with RVR ([place])   . Dysphagia, pharyngoesophageal phase   . History of ETT   . Encounter for central line placement   . Encounter for feeding tube placement   . Weakness of right lower extremity   . SOB (shortness of breath)   .  Pneumonia   . Pressure injury of skin 01/16/2017  . Acute respiratory failure ([place])   . Hyponatremia with decreased serum osmolality   . Hyponatremia   . Quadriplegia and quadriparesis ([place]) 01/14/2017  . GBS (Guillain  Barre syndrome) ([place]) 01/13/2017  . Weakness 01/12/2017  . Hypokalemia 01/12/2017  . Endometriosis 12/05/2013  . Chronic low back pain 08/19/2012  . HYPERLIPIDEMIA 12/10/2010  . COLONIC POLYPS, HX OF 12/13/2009  . HYPERGLYCEMIA, FASTING 11/22/2008  . Depression 02/07/2008  . ANOSMIA 02/07/2008  . HEADACHE, CHRONIC 02/07/2008  . MITRAL VALVE PROLAPSE 10/05/2007  . OSTEOPENIA 10/05/2007    Neo Yepiz 04/30/2017, 1:52 PM  [facility] [address] [place], [place], [address] Phone: [phone]   Fax:  [phone]  Name: Joanne Morrison MRN: [id number] Date of Birth: Aug 19, 1951

## 2017-05-01 ENCOUNTER — Ambulatory Visit: Payer: BLUE CROSS/BLUE SHIELD | Admitting: Occupational Therapy

## 2017-05-01 DIAGNOSIS — M25512 Pain in left shoulder: Secondary | ICD-10-CM

## 2017-05-01 DIAGNOSIS — R208 Other disturbances of skin sensation: Secondary | ICD-10-CM

## 2017-05-01 DIAGNOSIS — R278 Other lack of coordination: Secondary | ICD-10-CM

## 2017-05-01 DIAGNOSIS — M6281 Muscle weakness (generalized): Secondary | ICD-10-CM | POA: Diagnosis not present

## 2017-05-01 NOTE — Therapy (Signed)
[facility] [address] [place], [place], [address] Phone: [phone]   Fax:  [phone]  Occupational Therapy Treatment  Patient Details  Name: Joanne Morrison MRN: [id number] Date of Birth: September 27, 1951 Referring Provider: Dr Naaman Plummer  Encounter Date: 05/01/2017      OT End of Session - 05/01/17 1155    Visit Number 22   Number of Visits 84   Date for OT Re-Evaluation 04/25/17   Authorization Type BCBS  no visit limit   OT Start Time 1149   OT Stop Time 1230   OT Time Calculation (min) 41 min   Activity Tolerance Patient tolerated treatment well   Behavior During Therapy [facility] for tasks assessed/performed      Past Medical History:  Diagnosis Date  . Anxiety   . Arthritis    back  . Chronic cystitis   . Chronic low back pain   . Depression   . Diverticulosis of colon   . Hemorrhoids   . History of colon polyps    09-24-2009  rectal hyperplastic polyp/   and 2016 non-cancerous  . History of endometriosis   . History of panic attacks   . Hyperlipidemia   . Osteopenia   . Pelvic pain   . Sensation of pressure in bladder area   . Wears glasses     Past Surgical History:  Procedure Laterality Date  . BUNIONECTOMY Left 2004 approx  . CARPAL TUNNEL RELEASE Bilateral right 2014;  left 2007  . COLONOSCOPY W/ POLYPECTOMY  09/24/2009  . CYSTO WITH HYDRODISTENSION [name] 10/15/2016   Procedure: CYSTOSCOPY/HYDRODISTENSION AND MARCAINE INSTILLATION;  Surgeon: Rana Snare, MD;  Location: [facility];  Service: Urology;  Laterality: [name];  . CYSTO/  HYDRODISTENTION/  INSTILLATION THERAPY  1997 approx  . HYSTEROSCOPY W/D&C  11/01/2004   POLYPECTOMY  . IR GASTROSTOMY TUBE REMOVAL  03/20/2017  . IR GENERIC HISTORICAL  01/26/2017   IR GASTROSTOMY TUBE MOD SED 01/26/2017 Sandi Mariscal, MD [facility]INTER[facility] RAD  . LAPAROSCOPIC VAGINAL HYSTERECTOMY WITH SALPINGO OOPHORECTOMY Bilateral 04/20/2012  . TUBAL LIGATION  yrs ago     There were no vitals filed for this visit.      Subjective Assessment - 05/01/17 1150    Pertinent History hx of back pain (went to pain management center)   Currently in Pain? Yes   Pain Score 5    Pain Location Back   Pain Descriptors / Indicators Aching   Pain Type Acute pain   Pain Onset 1 to 4 weeks ago   Pain Frequency Intermittent   Aggravating Factors  overdoing it   Pain Relieving Factors repositioning         Treatment: supine closed chain shoulder flexion and chest press with PVC pipe frame, min facilitation/ v.c. Tall kneeling to roll ball forwards and backwards x 10 reps, min v.c. For positioning,  Prone scapular stability exercise(pillow under head / neck to protect eye, ) with arms in extension x 10 reps min v.c Supine shoulder circles with left and right UE's holding 1 lbs weight in right then left hand, 2 sets of 10 reps. Attempted prone on elbows reaching forward with each ahdn, pt demo significant scapular winging therefore discontinued. Seated AA/ROM shoulder flexion with RUE low range pushing tilted rod forwards and backwards, and small range hemiglide with therapist facilitating scapula hemiglide for LUE shoulder flexion mid range  min facilitation.. Biceps curls in seated with 1 lbs weight for LUE, no weight for RUE, x 10  reps each, min v.c. Functional midrange reaching with LUE to place graded clothespins on antennae with LUE 1 rest break required, min v.c./ facilitation Therapist discussed pt positioning to minimize left upper arm/ deltoid pain when reaching for dishes to wash, pt was encouraged to incorporate trunk rotation and avoid shoulder hiking.                      OT Short Term Goals - 04/30/17 1335      OT SHORT TERM GOAL #1   Title Patient will complete a home activity program daily with min prompting or set up -   Status Achieved     OT SHORT TERM GOAL #2   Title Patient will complete a home exercise program designed to  improve range of motion in BUE's with no more than set up assistance   Status Achieved     OT SHORT TERM GOAL #3   Title Patient will utilize bilateral upper extremities to cut a piece of meat during a meal with modified independence   Status Achieved     OT SHORT TERM GOAL #4   Title Patient will don compression hose with increased time and without physical assistance   Status Achieved     OT SHORT TERM GOAL #5   Title Patient will demonstrate sufficient activity tolerance to complete light housekeeping task in standing for greater than 5 minutes.     Status Achieved     OT SHORT TERM GOAL #6   Title Patient will demonstrate effective mid level reach to retrieve lightweight item less than 2 lbs from chest height shelf in standing using BUE's   Status Achieved     OT SHORT TERM GOAL #7   Title I with updated HEP- due 05/29/17   Time 4   Period Weeks   Status New     OT SHORT TERM GOAL #8   Title Pt will demonstrate RUE shoulder flexion to retrieve a lightweight object at 60 with only minimal compensations/ winging.   Baseline 40* prior to compensations/ winging   Time 4   Period Weeks   Status New     OT SHORT TERM GOAL  #9   TITLE Pt will demonstrate ability to retrieve a lightweight object at 110 shoulder flexion with LUE without reports of pain.   Baseline pain with shoulder flexion greater than 100*   Time 4   Period Weeks   Status New           OT Long Term Goals - 04/30/17 1335      OT LONG TERM GOAL #1   Title Patient will complete an updated home exercise program designed for UE strengthening daily-   Time 8   Period Weeks   Status Partially Met  Pt reports performing every other day     OT LONG TERM GOAL #2   Title Patient will prepare simple hot meal for she and her husband with no rest breaks- 30 min of activity   Status Achieved     OT LONG TERM GOAL #3   Title Patient will complete laundry task- including transporting items to washer, and  transferring to dryer, folding and putting away clothing with intermittent assistance only   Status Achieved     OT LONG TERM GOAL #4   Title Patient will utilize bilateral upper extremities to blow dry and style her hair with modified independence   Time 8   Period Weeks   Status On-going  requires assistance for the back of her hair from husband     OT [place] #5   Title Patient will demonstrate sufficient UE/LE strength and balance to reach to floor to retrieve a lightweight (no more than 2 lb) object without loss of balance   Time --   Period --   Status Achieved     OT LONG TERM GOAL #6   Title Patient will demonstrate sufficient strength for unilateral reach to retrieve a 3 lb object from overhead shelf while standing with right hand and left hand (not bilateral reach)   Time --   Period --   Status Partially Met  able to retrieve however pt demonstrates mod compensations with RUE     OT LONG TERM GOAL #7   Title Patient will demonstrate sufficient strength to get down to floor to clean tub, and get back up without assistance- LTG's due 06/27/17   Time 8   Period Weeks   Status On-going  partially met     OT LONG TERM GOAL #8   Title Pt will demonstrate ability to retrieve a 3 lbs weight from overhead shelf with RUE with no more than min compensations.   Baseline mod compensations retrieveing 3lbs weight from overhead shelf with RUE   Time 8   Period Weeks   Status New     OT LONG TERM GOAL  #9   Baseline Pt will resume performance of mod complex cooking and home management tasks at a modified independent level.   Time 8   Period Weeks   Status New               Plan - 05/01/17 1256    Clinical Impression Statement Pt is progressing towards goals. RUE is progressing more slowly than left.   Rehab Potential Good   OT Frequency 2x / week   OT Duration 8 weeks   OT Treatment/Interventions Self-care/ADL training;Electrical Stimulation;Therapeutic  exercise;Neuromuscular education;Energy conservation;Manual Therapy;Functional Mobility Training;DME and/or AE instruction;Visual/perceptual remediation/compensation;Cognitive remediation/compensation;Therapeutic activities;Balance training;Patient/family education;Therapeutic exercises;Passive range of motion   Plan continue to address gentle strengthening, mid range functional reaching LUE, low range functional use RUE to avoid scapular winging   Consulted and Agree with Plan of Care Patient;Family member/caregiver      Patient will benefit from skilled therapeutic intervention in order to improve the following deficits and impairments:  Decreased coordination, Decreased range of motion, Decreased endurance, Decreased safety awareness, Decreased balance, Decreased cognition, Decreased strength, Impaired perceived functional ability, Impaired vision/preception, Impaired UE functional use, Impaired tone, Pain, Impaired sensation, Improper body mechanics, Decreased activity tolerance  Visit Diagnosis: Other lack of coordination  Other disturbances of skin sensation  Muscle weakness (generalized)  Acute pain of left shoulder    Problem List Patient Active Problem List   Diagnosis Date Noted  . Bilateral rotator cuff syndrome 04/22/2017  . Acute blood loss anemia   . Physical deconditioning   . Tracheostomy status ([place])   . Hypoxemia   . Dysphagia   . Numbness and tingling   . S/P percutaneous endoscopic gastrostomy (PEG) tube placement ([place])   . Bradycardia   . Left corneal abrasion   . Abnormal chest x-ray   . Acute respiratory failure with hypoxia ([place])   . Atrial fibrillation with RVR ([place])   . Dysphagia, pharyngoesophageal phase   . History of ETT   . Encounter for central line placement   . Encounter for feeding tube placement   . Weakness of right lower  extremity   . SOB (shortness of breath)   . Pneumonia   . Pressure injury of skin 01/16/2017  . Acute respiratory  failure ([place])   . Hyponatremia with decreased serum osmolality   . Hyponatremia   . Quadriplegia and quadriparesis ([place]) 01/14/2017  . GBS (Guillain Barre syndrome) ([place]) 01/13/2017  . Weakness 01/12/2017  . Hypokalemia 01/12/2017  . Endometriosis 12/05/2013  . Chronic low back pain 08/19/2012  . HYPERLIPIDEMIA 12/10/2010  . COLONIC POLYPS, HX OF 12/13/2009  . HYPERGLYCEMIA, FASTING 11/22/2008  . Depression 02/07/2008  . ANOSMIA 02/07/2008  . HEADACHE, CHRONIC 02/07/2008  . MITRAL VALVE PROLAPSE 10/05/2007  . OSTEOPENIA 10/05/2007    RINE,KATHRYN 05/01/2017, 12:59 PM  [facility] [address] [place], [place], [address] Phone: [phone]   Fax:  [phone]  Name: Joanne Morrison MRN: [id number] Date of Birth: [birth year]

## 2017-05-05 ENCOUNTER — Ambulatory Visit: Payer: BLUE CROSS/BLUE SHIELD | Admitting: Occupational Therapy

## 2017-05-05 DIAGNOSIS — R2681 Unsteadiness on feet: Secondary | ICD-10-CM

## 2017-05-05 DIAGNOSIS — M6281 Muscle weakness (generalized): Secondary | ICD-10-CM | POA: Diagnosis not present

## 2017-05-05 DIAGNOSIS — R278 Other lack of coordination: Secondary | ICD-10-CM

## 2017-05-05 DIAGNOSIS — R208 Other disturbances of skin sensation: Secondary | ICD-10-CM

## 2017-05-05 NOTE — Therapy (Signed)
[facility] [address] [place], [place], [address] Phone: [phone]   Fax:  [phone]  Occupational Therapy Treatment  Patient Details  Name: Joanne Morrison MRN: [id number] Date of Birth: 11-Mar-1951 Referring Provider: Dr Naaman Plummer  Encounter Date: 05/05/2017      OT End of Session - 05/05/17 1017    Visit Number 23   Number of Visits 37   Date for OT Re-Evaluation 04/25/17   Authorization Type BCBS  no visit limit   OT Start Time 1018   OT Stop Time 1100   OT Time Calculation (min) 42 min   Activity Tolerance Patient tolerated treatment well   Behavior During Therapy [facility] for tasks assessed/performed      Past Medical History:  Diagnosis Date  . Anxiety   . Arthritis    back  . Chronic cystitis   . Chronic low back pain   . Depression   . Diverticulosis of colon   . Hemorrhoids   . History of colon polyps    09-24-2009  rectal hyperplastic polyp/   and 2016 non-cancerous  . History of endometriosis   . History of panic attacks   . Hyperlipidemia   . Osteopenia   . Pelvic pain   . Sensation of pressure in bladder area   . Wears glasses     Past Surgical History:  Procedure Laterality Date  . BUNIONECTOMY Left 2004 approx  . CARPAL TUNNEL RELEASE Bilateral right 2014;  left 2007  . COLONOSCOPY W/ POLYPECTOMY  09/24/2009  . CYSTO WITH HYDRODISTENSION [name] 10/15/2016   Procedure: CYSTOSCOPY/HYDRODISTENSION AND MARCAINE INSTILLATION;  Surgeon: Rana Snare, MD;  Location: [facility];  Service: Urology;  Laterality: [name];  . CYSTO/  HYDRODISTENTION/  INSTILLATION THERAPY  1997 approx  . HYSTEROSCOPY W/D&C  11/01/2004   POLYPECTOMY  . IR GASTROSTOMY TUBE REMOVAL  03/20/2017  . IR GENERIC HISTORICAL  01/26/2017   IR GASTROSTOMY TUBE MOD SED 01/26/2017 Sandi Mariscal, MD [facility]-INTERV RAD  . LAPAROSCOPIC VAGINAL HYSTERECTOMY WITH SALPINGO OOPHORECTOMY Bilateral 04/20/2012  . TUBAL LIGATION  yrs ago     There were no vitals filed for this visit.      Subjective Assessment - 05/05/17 1637    Subjective  Pt reports that she can't tell if she is still improving   Pertinent History hx of back pain (went to pain management center)   Currently in Pain? No/denies       In supine, shoulder flex, chest press, and diagonals AAROM with BUEs with min cues/facilitation.  In prone, wt. Bearing on elbows with chest/head lift for scapular depression with min cues.  Scapular retraction with shoulders positioned in extension and abduction with min cueing.  In quadraped, cat/cow positions for incr scapular stability and UE strength with min cueing.  In tall kneeling, rolling ball forward/backward for AAROM shoulder flex with min cueing.   Wall push-ups for incr scapular stability with mod cueing/facilitation.  In sitting, wt. Bearing through [place] on mat with body on arm movements/mid-range LUE functional reaching with min-mod cueing for normal movement patterns then wt. Bearing through LUE with body on arm movements/low range RUE functional reaching with min-mod cueing for normal movement patterns.                                OT Short Term Goals - 04/30/17 1335      OT SHORT TERM GOAL #1  Title Patient will complete a home activity program daily with min prompting or set up --check STGs 03/27/17   Status Achieved     OT SHORT TERM GOAL #2   Title Patient will complete a home exercise program designed to improve range of motion in BUE's with no more than set up assistance   Status Achieved     OT SHORT TERM GOAL #3   Title Patient will utilize bilateral upper extremities to cut a piece of meat during a meal with modified independence   Status Achieved     OT SHORT TERM GOAL #4   Title Patient will don compression hose with increased time and without physical assistance   Status Achieved     OT SHORT TERM GOAL #5   Title Patient will demonstrate sufficient  activity tolerance to complete light housekeeping task in standing for greater than 5 minutes.     Status Achieved     OT SHORT TERM GOAL #6   Title Patient will demonstrate effective mid level reach to retrieve lightweight item less than 2 lbs from chest height shelf in standing using BUE's   Status Achieved     OT SHORT TERM GOAL #7   Title I with updated HEP- due 05/29/17   Time 4   Period Weeks   Status New     OT SHORT TERM GOAL #8   Title Pt will demonstrate RUE shoulder flexion to retrieve a lightweight object at 60 with only minimal compensations/ winging.   Baseline 40* prior to compensations/ winging   Time 4   Period Weeks   Status New     OT SHORT TERM GOAL  #9   TITLE Pt will demonstrate ability to retrieve a lightweight object at 110 shoulder flexion with LUE without reports of pain.   Baseline pain with shoulder flexion greater than 100*   Time 4   Period Weeks   Status New           OT Long Term Goals - 04/30/17 1335      OT LONG TERM GOAL #1   Title Patient will complete an updated home exercise program designed for UE strengthening daily--check LTGs 04/26/17   Time 8   Period Weeks   Status Partially Met  Pt reports performing every other day     OT LONG TERM GOAL #2   Title Patient will prepare simple hot meal for she and her husband with no rest breaks- 30 min of activity   Status Achieved     OT LONG TERM GOAL #3   Title Patient will complete laundry task- including transporting items to washer, and transferring to dryer, folding and putting away clothing with intermittent assistance only   Status Achieved     OT LONG TERM GOAL #4   Title Patient will utilize bilateral upper extremities to blow dry and style her hair with modified independence   Time 8   Period Weeks   Status On-going  requires assistance for the back of her hair from husband     OT LONG TERM GOAL #5   Title Patient will demonstrate sufficient UE/LE strength and balance to  reach to floor to retrieve a lightweight (no more than 2 lb) object without loss of balance   Time --   Period --   Status Achieved     OT LONG TERM GOAL #6   Title Patient will demonstrate sufficient strength for unilateral reach to retrieve a 3 lb object from  overhead shelf while standing with right hand and left hand (not bilateral reach)   Time --   Period --   Status Partially Met  able to retrieve however pt demonstrates mod compensations with RUE     OT LONG TERM GOAL #7   Title Patient will demonstrate sufficient strength to get down to floor to clean tub, and get back up without assistance- LTG's due 06/27/17   Time 8   Period Weeks   Status On-going  partially met     OT LONG TERM GOAL #8   Title Pt will demonstrate ability to retrieve a 3 lbs weight from overhead shelf with RUE with no more than min compensations.   Baseline mod compensations retrieveing 3lbs weight from overhead shelf with RUE   Time 8   Period Weeks   Status New     OT LONG TERM GOAL  #9   Baseline Pt will resume performance of mod complex cooking and home management tasks at a modified independent level.   Time 8   Period Weeks   Status New               Plan - 05/05/17 1103    Clinical Impression Statement Pt is progressing toards goals with improving UE control with movement, however, pt continues to need cueing for compensation/normal movement patterns.   Rehab Potential Good   OT Frequency 2x / week   OT Duration 8 weeks   OT Treatment/Interventions Self-care/ADL training;Electrical Stimulation;Therapeutic exercise;Neuromuscular education;Energy conservation;Manual Therapy;Functional Mobility Training;DME and/or AE instruction;Visual/perceptual remediation/compensation;Cognitive remediation/compensation;Therapeutic activities;Balance training;Patient/family education;Therapeutic exercises;Passive range of motion   Plan mid range functional reaching LUE, low range reaching RUE, neuro  re-ed   Consulted and Agree with Plan of Care Patient;Family member/caregiver      Patient will benefit from skilled therapeutic intervention in order to improve the following deficits and impairments:  Decreased coordination, Decreased range of motion, Decreased endurance, Decreased safety awareness, Decreased balance, Decreased cognition, Decreased strength, Impaired perceived functional ability, Impaired vision/preception, Impaired UE functional use, Impaired tone, Pain, Impaired sensation, Improper body mechanics, Decreased activity tolerance  Visit Diagnosis: Muscle weakness (generalized)  Other lack of coordination  Other disturbances of skin sensation  Unsteadiness on feet    Problem List Patient Active Problem List   Diagnosis Date Noted  . Bilateral rotator cuff syndrome 04/22/2017  . Acute blood loss anemia   . Physical deconditioning   . Tracheostomy status ([place])   . Hypoxemia   . Dysphagia   . Numbness and tingling   . S/P percutaneous endoscopic gastrostomy (PEG) tube placement ([place])   . Bradycardia   . Left corneal abrasion   . Abnormal chest x-ray   . Acute respiratory failure with hypoxia ([place])   . Atrial fibrillation with RVR ([place])   . Dysphagia, pharyngoesophageal phase   . History of ETT   . Encounter for central line placement   . Encounter for feeding tube placement   . Weakness of right lower extremity   . SOB (shortness of breath)   . Pneumonia   . Pressure injury of skin 01/16/2017  . Acute respiratory failure ([place])   . Hyponatremia with decreased serum osmolality   . Hyponatremia   . Quadriplegia and quadriparesis ([place]) 01/14/2017  . GBS (Guillain Barre syndrome) ([place]) 01/13/2017  . Weakness 01/12/2017  . Hypokalemia 01/12/2017  . Endometriosis 12/05/2013  . Chronic low back pain 08/19/2012  . HYPERLIPIDEMIA 12/10/2010  . COLONIC POLYPS, HX OF 12/13/2009  . HYPERGLYCEMIA, FASTING  11/22/2008  . Depression 02/07/2008  . ANOSMIA 02/07/2008   . HEADACHE, CHRONIC 02/07/2008  . MITRAL VALVE PROLAPSE 10/05/2007  . OSTEOPENIA 10/05/2007    [facility] 05/05/2017, 4:38 PM  [facility] [address] [place], [place], [address] Phone: [phone]   Fax:  [phone]  Name: Joanne Morrison MRN: [id number] Date of Birth: [birth year]   Vianne Bulls, OTR/L [facility] [address] [place], [place]  [address] [phone] phone [phone] 05/05/17 4:38 PM

## 2017-05-07 ENCOUNTER — Ambulatory Visit: Payer: BLUE CROSS/BLUE SHIELD | Admitting: Occupational Therapy

## 2017-05-07 DIAGNOSIS — R2681 Unsteadiness on feet: Secondary | ICD-10-CM

## 2017-05-07 DIAGNOSIS — M6281 Muscle weakness (generalized): Secondary | ICD-10-CM

## 2017-05-07 DIAGNOSIS — R278 Other lack of coordination: Secondary | ICD-10-CM

## 2017-05-07 DIAGNOSIS — R208 Other disturbances of skin sensation: Secondary | ICD-10-CM

## 2017-05-07 NOTE — Patient Instructions (Addendum)
  Scapular Retraction: Abduction / Extension (Prone)   Lie with arms out from sides 90. Pinch shoulder blades together and raise arms a few inches from floor. Repeat 10-15 times per set. Do 1 sessions per day.     OTHER: Scapular Protraction    Use shoulder muscles to lift chest/head off floor. Do not lift hips. Hold 5 seconds. 10x per day,  (elbows under shoulders, hands down flat)

## 2017-05-07 NOTE — Therapy (Signed)
[facility] [address] [place], [place], [address] Phone: [phone]   Fax:  [phone]  Occupational Therapy Treatment  Patient Details  Name: Joanne Morrison MRN: [id number] Date of Birth: 1951-05-19 Referring Provider: Dr Naaman Plummer  Encounter Date: 05/07/2017      OT End of Session - 05/07/17 0955    Visit Number 24   Number of Visits 37   Date for OT Re-Evaluation 04/25/17   Authorization Type BCBS  no visit limit   OT Start Time 0933   OT Stop Time 1015   OT Time Calculation (min) 42 min   Activity Tolerance Patient tolerated treatment well   Behavior During Therapy [facility] for tasks assessed/performed      Past Medical History:  Diagnosis Date  . Anxiety   . Arthritis    back  . Chronic cystitis   . Chronic low back pain   . Depression   . Diverticulosis of colon   . Hemorrhoids   . History of colon polyps    09-24-2009  rectal hyperplastic polyp/   and 2016 non-cancerous  . History of endometriosis   . History of panic attacks   . Hyperlipidemia   . Osteopenia   . Pelvic pain   . Sensation of pressure in bladder area   . Wears glasses     Past Surgical History:  Procedure Laterality Date  . BUNIONECTOMY Left 2004 approx  . CARPAL TUNNEL RELEASE Bilateral right 2014;  left 2007  . COLONOSCOPY W/ POLYPECTOMY  09/24/2009  . CYSTO WITH HYDRODISTENSION [name] 10/15/2016   Procedure: CYSTOSCOPY/HYDRODISTENSION AND MARCAINE INSTILLATION;  Surgeon: Rana Snare, MD;  Location: [facility];  Service: Urology;  Laterality: [name];  . CYSTO/  HYDRODISTENTION/  INSTILLATION THERAPY  1997 approx  . HYSTEROSCOPY W/D&C  11/01/2004   POLYPECTOMY  . IR GASTROSTOMY TUBE REMOVAL  03/20/2017  . IR GENERIC HISTORICAL  01/26/2017   IR GASTROSTOMY TUBE MOD SED 01/26/2017 Sandi Mariscal, MD [facility]INTER[facility]  . LAPAROSCOPIC VAGINAL HYSTERECTOMY WITH SALPINGO OOPHORECTOMY Bilateral 04/20/2012  . TUBAL LIGATION  yrs ago     There were no vitals filed for this visit.      Subjective Assessment - 05/07/17 0953    Subjective  My L arm wasn't as sore.  My L arm doesn't "fly" as much in the morning when I reach up   Pertinent History hx of back pain (went to pain management center)   Currently in Pain? No/denies      In supine, shoulder flex, chest press, with BUEs with min cues/facilitation.  Shoulder flex, chest press, and circumduction with each UE with 1lb wt. With min-mod facilitation/cues.   In prone, wt. Bearing on elbows with chest/head lift for scapular depression with min cues.  Scapular retraction with shoulders positioned in extension and abduction with min cueing.  In sitting, wt. Bearing through [place] on mat with body on arm movements/mid-range LUE functional reaching with min-mod cueing for normal movement patterns then wt. Bearing through LUE with body on arm movements/low range RUE functional reaching with min-mod cueing for normal movement patterns.  Functional reaching with LUE in low-mid range with mod cueing/facilitation for normal movement patterns.             OT Education - 05/07/17 0954    Education Details Scapular retraction with shoulders in abduction, wt. bearing in prone with scapuar depression--see pt instructions   Person(s) Educated Patient;Spouse   Methods Explanation;Demonstration;Verbal cues;Handout   Comprehension Returned  demonstration;Verbalized understanding;Verbal cues required          OT Short Term Goals - 04/30/17 1335      OT SHORT TERM GOAL #1   Title Patient will complete a home activity program daily with min prompting or set up --check STGs 03/27/17   Status Achieved     OT SHORT TERM GOAL #2   Title Patient will complete a home exercise program designed to improve range of motion in BUE's with no more than set up assistance   Status Achieved     OT SHORT TERM GOAL #3   Title Patient will utilize bilateral upper extremities to cut a piece of  meat during a meal with modified independence   Status Achieved     OT SHORT TERM GOAL #4   Title Patient will don compression hose with increased time and without physical assistance   Status Achieved     OT SHORT TERM GOAL #5   Title Patient will demonstrate sufficient activity tolerance to complete light housekeeping task in standing for greater than 5 minutes.     Status Achieved     OT SHORT TERM GOAL #6   Title Patient will demonstrate effective mid level reach to retrieve lightweight item less than 2 lbs from chest height shelf in standing using BUE's   Status Achieved     OT SHORT TERM GOAL #7   Title I with updated HEP- due 05/29/17   Time 4   Period Weeks   Status New     OT SHORT TERM GOAL #8   Title Pt will demonstrate RUE shoulder flexion to retrieve a lightweight object at 60 with only minimal compensations/ winging.   Baseline 40* prior to compensations/ winging   Time 4   Period Weeks   Status New     OT SHORT TERM GOAL  #9   TITLE Pt will demonstrate ability to retrieve a lightweight object at 110 shoulder flexion with LUE without reports of pain.   Baseline pain with shoulder flexion greater than 100*   Time 4   Period Weeks   Status New           OT Long Term Goals - 04/30/17 1335      OT LONG TERM GOAL #1   Title Patient will complete an updated home exercise program designed for UE strengthening daily--check LTGs 04/26/17   Time 8   Period Weeks   Status Partially Met  Pt reports performing every other day     OT LONG TERM GOAL #2   Title Patient will prepare simple hot meal for she and her husband with no rest breaks- 30 min of activity   Status Achieved     OT LONG TERM GOAL #3   Title Patient will complete laundry task- including transporting items to washer, and transferring to dryer, folding and putting away clothing with intermittent assistance only   Status Achieved     OT LONG TERM GOAL #4   Title Patient will utilize bilateral  upper extremities to blow dry and style her hair with modified independence   Time 8   Period Weeks   Status On-going  requires assistance for the back of her hair from husband     OT LONG TERM GOAL #5   Title Patient will demonstrate sufficient UE/LE strength and balance to reach to floor to retrieve a lightweight (no more than 2 lb) object without loss of balance   Time --   Period --  Status Achieved     OT LONG TERM GOAL #6   Title Patient will demonstrate sufficient strength for unilateral reach to retrieve a 3 lb object from overhead shelf while standing with right hand and left hand (not bilateral reach)   Time --   Period --   Status Partially Met  able to retrieve however pt demonstrates mod compensations with RUE     OT LONG TERM GOAL #7   Title Patient will demonstrate sufficient strength to get down to floor to clean tub, and get back up without assistance- LTG's due 06/27/17   Time 8   Period Weeks   Status On-going  partially met     OT LONG TERM GOAL #8   Title Pt will demonstrate ability to retrieve a 3 lbs weight from overhead shelf with RUE with no more than min compensations.   Baseline mod compensations retrieveing 3lbs weight from overhead shelf with RUE   Time 8   Period Weeks   Status New     OT LONG TERM GOAL  #9   Baseline Pt will resume performance of mod complex cooking and home management tasks at a modified independent level.   Time 8   Period Weeks   Status New               Plan - 05/07/17 1610    Clinical Impression Statement Pt continues to progress towards goals with improving UE control.  Pt reports incr ability to reach with LUE without compensation.   Rehab Potential Good   OT Frequency 2x / week   OT Duration 8 weeks   OT Treatment/Interventions Self-care/ADL training;Electrical Stimulation;Therapeutic exercise;Neuromuscular education;Energy conservation;Manual Therapy;Functional Mobility Training;DME and/or AE  instruction;Visual/perceptual remediation/compensation;Cognitive remediation/compensation;Therapeutic activities;Balance training;Patient/family education;Therapeutic exercises;Passive range of motion   Plan mid-range functional reaching LUE, low range functional reaching RUE, neuro re-ed, scapular strengthening   Consulted and Agree with Plan of Care Patient;Family member/caregiver      Patient will benefit from skilled therapeutic intervention in order to improve the following deficits and impairments:  Decreased coordination, Decreased range of motion, Decreased endurance, Decreased safety awareness, Decreased balance, Decreased cognition, Decreased strength, Impaired perceived functional ability, Impaired vision/preception, Impaired UE functional use, Impaired tone, Pain, Impaired sensation, Improper body mechanics, Decreased activity tolerance  Visit Diagnosis: Muscle weakness (generalized)  Other lack of coordination  Other disturbances of skin sensation  Unsteadiness on feet    Problem List Patient Active Problem List   Diagnosis Date Noted  . Bilateral rotator cuff syndrome 04/22/2017  . Acute blood loss anemia   . Physical deconditioning   . Tracheostomy status ([place])   . Hypoxemia   . Dysphagia   . Numbness and tingling   . S/P percutaneous endoscopic gastrostomy (PEG) tube placement ([place])   . Bradycardia   . Left corneal abrasion   . Abnormal chest x-ray   . Acute respiratory failure with hypoxia ([place])   . Atrial fibrillation with RVR ([place])   . Dysphagia, pharyngoesophageal phase   . History of ETT   . Encounter for central line placement   . Encounter for feeding tube placement   . Weakness of right lower extremity   . SOB (shortness of breath)   . Pneumonia   . Pressure injury of skin 01/16/2017  . Acute respiratory failure ([place])   . Hyponatremia with decreased serum osmolality   . Hyponatremia   . Quadriplegia and quadriparesis ([place]) 01/14/2017  . GBS  (Guillain Barre syndrome) ([place]) 01/13/2017  .  Weakness 01/12/2017  . Hypokalemia 01/12/2017  . Endometriosis 12/05/2013  . Chronic low back pain 08/19/2012  . HYPERLIPIDEMIA 12/10/2010  . COLONIC POLYPS, HX OF 12/13/2009  . HYPERGLYCEMIA, FASTING 11/22/2008  . Depression 02/07/2008  . ANOSMIA 02/07/2008  . HEADACHE, CHRONIC 02/07/2008  . MITRAL VALVE PROLAPSE 10/05/2007  . OSTEOPENIA 10/05/2007    [facility] 05/07/2017, 12:43 PM  [place] [address] [place], [place], [address] Phone: [phone]   Fax:  [phone]  Name: Joanne Morrison MRN: [id number] Date of Birth: 15-Dec-1951   Vianne Bulls, OTR/L [facility] [address] [employer] [place], [place]  [address] [phone] phone [phone] 05/07/17 12:43 PM

## 2017-05-12 ENCOUNTER — Ambulatory Visit: Payer: BLUE CROSS/BLUE SHIELD | Admitting: Occupational Therapy

## 2017-05-12 DIAGNOSIS — R208 Other disturbances of skin sensation: Secondary | ICD-10-CM

## 2017-05-12 DIAGNOSIS — R278 Other lack of coordination: Secondary | ICD-10-CM

## 2017-05-12 DIAGNOSIS — M6281 Muscle weakness (generalized): Secondary | ICD-10-CM

## 2017-05-12 NOTE — Therapy (Signed)
[facility] [address] [place], [place], [address] Phone: [phone]   Fax:  [phone]  Occupational Therapy Treatment  Patient Details  Name: Joanne Morrison MRN: [id number] Date of Birth: [birth year] Referring Provider: Dr Riley Kill  Encounter Date: 05/12/2017      OT End of Session - 05/12/17 1008    Visit Number 25   Number of Visits 37   Authorization Type BCBS  no visit limit   OT Start Time 0848   OT Stop Time 0935   OT Time Calculation (min) 47 min   Activity Tolerance Patient tolerated treatment well   Behavior During Therapy [facility] for tasks assessed/performed      Past Medical History:  Diagnosis Date  . Anxiety   . Arthritis    back  . Chronic cystitis   . Chronic low back pain   . Depression   . Diverticulosis of colon   . Hemorrhoids   . History of colon polyps    09-24-2009  rectal hyperplastic polyp/   and 2016 non-cancerous  . History of endometriosis   . History of panic attacks   . Hyperlipidemia   . Osteopenia   . Pelvic pain   . Sensation of pressure in bladder area   . Wears glasses     Past Surgical History:  Procedure Laterality Date  . BUNIONECTOMY Left 2004 approx  . CARPAL TUNNEL RELEASE Bilateral right 2014;  left 2007  . COLONOSCOPY W/ POLYPECTOMY  09/24/2009  . CYSTO WITH HYDRODISTENSION [name] 10/15/2016   Procedure: CYSTOSCOPY/HYDRODISTENSION AND MARCAINE INSTILLATION;  Surgeon: Barron Alvine, MD;  Location: [facility];  Service: Urology;  Laterality: [name];  . CYSTO/  HYDRODISTENTION/  INSTILLATION THERAPY  1997 approx  . HYSTEROSCOPY W/D&C  11/01/2004   POLYPECTOMY  . IR GASTROSTOMY TUBE REMOVAL  03/20/2017  . IR GENERIC HISTORICAL  01/26/2017   IR GASTROSTOMY TUBE MOD SED 01/26/2017 Simonne Come, MD [facility]INTER[facility] RAD  . LAPAROSCOPIC VAGINAL HYSTERECTOMY WITH SALPINGO OOPHORECTOMY Bilateral 04/20/2012  . TUBAL LIGATION  yrs ago    There were no vitals filed for  this visit.      Subjective Assessment - 05/12/17 0902    Pertinent History hx of back pain (went to pain management center)   Currently in Pain? Yes   Pain Score 2    Pain Location Arm   Pain Orientation Left;Right   Pain Descriptors / Indicators Aching   Pain Type Chronic pain   Pain Onset More than a month ago   Pain Frequency Intermittent   Aggravating Factors  overdoing it   Pain Relieving Factors repositioning   Multiple Pain Sites No        Treatment:In supine, chest press, with BUEs with min cues/facilitation.  In prone, wt. Bearing on elbows with chest/head lift for scapular depression with min cues.  Scapular retraction with shoulders positioned in extension and abduction with min cueing.  Low range wall push ups with therapist facilitating right scapula, then unilateral wall slides with RUE, therapist facilitating elbow position   Hemiglide for LUE shoulder flexion AA/ROM, AA/ROM with RUE holding pole to move forwards and backwards with min facilitation. Biceps curls bilateral UE's in seated with 1 lbs weight.                      OT Short Term Goals - 04/30/17 1335      OT SHORT TERM GOAL #1   Title Patient will complete a home  activity program daily with min prompting or set up --check STGs 03/27/17   Status Achieved     OT SHORT TERM GOAL #2   Title Patient will complete a home exercise program designed to improve range of motion in BUE's with no more than set up assistance   Status Achieved     OT SHORT TERM GOAL #3   Title Patient will utilize bilateral upper extremities to cut a piece of meat during a meal with modified independence   Status Achieved     OT SHORT TERM GOAL #4   Title Patient will don compression hose with increased time and without physical assistance   Status Achieved     OT SHORT TERM GOAL #5   Title Patient will demonstrate sufficient activity tolerance to complete light housekeeping task in standing for greater  than 5 minutes.     Status Achieved     OT SHORT TERM GOAL #6   Title Patient will demonstrate effective mid level reach to retrieve lightweight item less than 2 lbs from chest height shelf in standing using BUE's   Status Achieved     OT SHORT TERM GOAL #7   Title I with updated HEP- due 05/29/17   Time 4   Period Weeks   Status New     OT SHORT TERM GOAL #8   Title Pt will demonstrate RUE shoulder flexion to retrieve a lightweight object at 60 with only minimal compensations/ winging.   Baseline 40* prior to compensations/ winging   Time 4   Period Weeks   Status New     OT SHORT TERM GOAL  #9   TITLE Pt will demonstrate ability to retrieve a lightweight object at 110 shoulder flexion with LUE without reports of pain.   Baseline pain with shoulder flexion greater than 100*   Time 4   Period Weeks   Status New           OT Long Term Goals - 04/30/17 1335      OT LONG TERM GOAL #1   Title Patient will complete an updated home exercise program designed for UE strengthening daily--check LTGs 04/26/17   Time 8   Period Weeks   Status Partially Met  Pt reports performing every other day     OT LONG TERM GOAL #2   Title Patient will prepare simple hot meal for she and her husband with no rest breaks- 30 min of activity   Status Achieved     OT LONG TERM GOAL #3   Title Patient will complete laundry task- including transporting items to washer, and transferring to dryer, folding and putting away clothing with intermittent assistance only   Status Achieved     OT LONG TERM GOAL #4   Title Patient will utilize bilateral upper extremities to blow dry and style her hair with modified independence   Time 8   Period Weeks   Status On-going  requires assistance for the back of her hair from husband     OT LONG TERM GOAL #5   Title Patient will demonstrate sufficient UE/LE strength and balance to reach to floor to retrieve a lightweight (no more than 2 lb) object without loss  of balance   Time --   Period --   Status Achieved     OT LONG TERM GOAL #6   Title Patient will demonstrate sufficient strength for unilateral reach to retrieve a 3 lb object from overhead shelf while standing with right  hand and left hand (not bilateral reach)   Time --   Period --   Status Partially Met  able to retrieve however pt demonstrates mod compensations with RUE     OT LONG TERM GOAL #7   Title Patient will demonstrate sufficient strength to get down to floor to clean tub, and get back up without assistance- LTG's due 06/27/17   Time 8   Period Weeks   Status On-going  partially met     OT LONG TERM GOAL #8   Title Pt will demonstrate ability to retrieve a 3 lbs weight from overhead shelf with RUE with no more than min compensations.   Baseline mod compensations retrieveing 3lbs weight from overhead shelf with RUE   Time 8   Period Weeks   Status New     OT LONG TERM GOAL  #9   Baseline Pt will resume performance of mod complex cooking and home management tasks at a modified independent level.   Time 8   Period Weeks   Status New               Plan - 05/12/17 1009    Clinical Impression Statement Pt is progressing towards goals with improving UE strength and control. Pt reports styling her own hair yet she fatigues quickly.   Occupational performance deficits (Please refer to evaluation for details): ADL's;IADL's;Play;Leisure;Social Participation   Rehab Potential Good   OT Frequency 2x / week   OT Duration 8 weeks   OT Treatment/Interventions Self-care/ADL training;Electrical Stimulation;Therapeutic exercise;Neuromuscular education;Energy conservation;Manual Therapy;Functional Mobility Training;DME and/or AE instruction;Visual/perceptual remediation/compensation;Cognitive remediation/compensation;Therapeutic activities;Balance training;Patient/family education;Therapeutic exercises;Passive range of motion   Clinical Decision Making Several treatment  options, min-mod task modification necessary   Consulted and Agree with Plan of Care Patient;Family member/caregiver   Plan continue to address neuro re-ed, scapular  strengthening      Patient will benefit from skilled therapeutic intervention in order to improve the following deficits and impairments:  Decreased coordination, Decreased range of motion, Decreased endurance, Decreased safety awareness, Decreased balance, Decreased cognition, Decreased strength, Impaired perceived functional ability, Impaired vision/preception, Impaired UE functional use, Impaired tone, Pain, Impaired sensation, Improper body mechanics, Decreased activity tolerance  Visit Diagnosis: Muscle weakness (generalized)  Other lack of coordination  Other disturbances of skin sensation    Problem List Patient Active Problem List   Diagnosis Date Noted  . Bilateral rotator cuff syndrome 04/22/2017  . Acute blood loss anemia   . Physical deconditioning   . Tracheostomy status ([place])   . Hypoxemia   . Dysphagia   . Numbness and tingling   . S/P percutaneous endoscopic gastrostomy (PEG) tube placement ([place])   . Bradycardia   . Left corneal abrasion   . Abnormal chest x-ray   . Acute respiratory failure with hypoxia ([place])   . Atrial fibrillation with RVR ([place])   . Dysphagia, pharyngoesophageal phase   . History of ETT   . Encounter for central line placement   . Encounter for feeding tube placement   . Weakness of right lower extremity   . SOB (shortness of breath)   . Pneumonia   . Pressure injury of skin 01/16/2017  . Acute respiratory failure ([place])   . Hyponatremia with decreased serum osmolality   . Hyponatremia   . Quadriplegia and quadriparesis ([place]) 01/14/2017  . GBS (Guillain Barre syndrome) ([place]) 01/13/2017  . Weakness 01/12/2017  . Hypokalemia 01/12/2017  . Endometriosis 12/05/2013  . Chronic low back pain 08/19/2012  . HYPERLIPIDEMIA  12/10/2010  . COLONIC POLYPS, HX OF 12/13/2009  .  HYPERGLYCEMIA, FASTING 11/22/2008  . Depression 02/07/2008  . ANOSMIA 02/07/2008  . HEADACHE, CHRONIC 02/07/2008  . MITRAL VALVE PROLAPSE 10/05/2007  . OSTEOPENIA 10/05/2007    RINE,KATHRYN 05/12/2017, 10:12 AM  [facility] [address] [place], [place], [address] Phone: [phone]   Fax:  [phone]  Name: Biannca Scantlin MRN: [id number] Date of Birth: [birth year]

## 2017-05-14 ENCOUNTER — Ambulatory Visit: Payer: BLUE CROSS/BLUE SHIELD | Admitting: Occupational Therapy

## 2017-05-14 DIAGNOSIS — M6281 Muscle weakness (generalized): Secondary | ICD-10-CM

## 2017-05-14 DIAGNOSIS — R208 Other disturbances of skin sensation: Secondary | ICD-10-CM

## 2017-05-14 DIAGNOSIS — R278 Other lack of coordination: Secondary | ICD-10-CM

## 2017-05-14 NOTE — Therapy (Signed)
[facility] [address] [place], [place], [address] Phone: [phone]   Fax:  [phone]  Occupational Therapy Treatment  Patient Details  Name: Joanne Morrison MRN: [id number] Date of Birth: [birth year] Referring Provider: Dr Naaman Plummer  Encounter Date: 05/14/2017      OT End of Session - 05/14/17 1157    Visit Number 26   Number of Visits 37   Authorization Type BCBS  no visit limit   OT Start Time 1019   OT Stop Time 1103   OT Time Calculation (min) 44 min   Activity Tolerance Patient tolerated treatment well   Behavior During Therapy [facility] for tasks assessed/performed      Past Medical History:  Diagnosis Date  . Anxiety   . Arthritis    back  . Chronic cystitis   . Chronic low back pain   . Depression   . Diverticulosis of colon   . Hemorrhoids   . History of colon polyps    09-24-2009  rectal hyperplastic polyp/   and 2016 non-cancerous  . History of endometriosis   . History of panic attacks   . Hyperlipidemia   . Osteopenia   . Pelvic pain   . Sensation of pressure in bladder area   . Wears glasses     Past Surgical History:  Procedure Laterality Date  . BUNIONECTOMY Left 2004 approx  . CARPAL TUNNEL RELEASE Bilateral right 2014;  left 2007  . COLONOSCOPY W/ POLYPECTOMY  09/24/2009  . CYSTO WITH HYDRODISTENSION [name] 10/15/2016   Procedure: CYSTOSCOPY/HYDRODISTENSION AND MARCAINE INSTILLATION;  Surgeon: Rana Snare, MD;  Location: [facility];  Service: Urology;  Laterality: [name];  . CYSTO/  HYDRODISTENTION/  INSTILLATION THERAPY  1997 approx  . HYSTEROSCOPY W/D&C  11/01/2004   POLYPECTOMY  . IR GASTROSTOMY TUBE REMOVAL  03/20/2017  . IR GENERIC HISTORICAL  01/26/2017   IR GASTROSTOMY TUBE MOD SED 01/26/2017 Sandi Mariscal, MD [facility]  . LAPAROSCOPIC VAGINAL HYSTERECTOMY WITH SALPINGO OOPHORECTOMY Bilateral 04/20/2012  . TUBAL LIGATION  yrs ago    There were no vitals filed for  this visit.      Subjective Assessment - 05/14/17 1030    Subjective  Pt denies pain today   Pertinent History hx of back pain (went to pain management center)   Currently in Pain? No/denies       Treatment:In supine,shoulder flexion and  chest press, with BUEs with min cues/facilitation.  In prone, wt. Bearing on elbows with chest/head lift for scapular depression with min cues.  Scapular retraction with shoulders positioned in extension and abduction with min cueing.  Low range wall push ups with therapist facilitating right scapula, then unilateral wall slides with RUE, therapist facilitating elbow position   Hemiglide for LUE shoulder flexion AA/ROM, AA/ROM with RUE holding pole to move forwards and backwards with min facilitation.  UE ranger for midrange RUE AA/ROM, mod facilitation at right scapula  Rolling ball forwards and backwards while standing facing mat for bilateral shoulder flexion AA/ROM.  Low range functional reaching with RUE and mid-high range functional reaching with LUE, min facilitation and min v.c to avoid compensation, frequent rest breaks required.                           OT Short Term Goals - 04/30/17 1335      OT SHORT TERM GOAL #1   Title Patient will complete a home activity program daily with  min prompting or set up --check STGs 03/27/17   Status Achieved     OT SHORT TERM GOAL #2   Title Patient will complete a home exercise program designed to improve range of motion in BUE's with no more than set up assistance   Status Achieved     OT SHORT TERM GOAL #3   Title Patient will utilize bilateral upper extremities to cut a piece of meat during a meal with modified independence   Status Achieved     OT SHORT TERM GOAL #4   Title Patient will don compression hose with increased time and without physical assistance   Status Achieved     OT SHORT TERM GOAL #5   Title Patient will demonstrate sufficient activity tolerance to  complete light housekeeping task in standing for greater than 5 minutes.     Status Achieved     OT SHORT TERM GOAL #6   Title Patient will demonstrate effective mid level reach to retrieve lightweight item less than 2 lbs from chest height shelf in standing using BUE's   Status Achieved     OT SHORT TERM GOAL #7   Title I with updated HEP- due 05/29/17   Time 4   Period Weeks   Status New     OT SHORT TERM GOAL #8   Title Pt will demonstrate RUE shoulder flexion to retrieve a lightweight object at 60 with only minimal compensations/ winging.   Baseline 40* prior to compensations/ winging   Time 4   Period Weeks   Status New     OT SHORT TERM GOAL  #9   TITLE Pt will demonstrate ability to retrieve a lightweight object at 110 shoulder flexion with LUE without reports of pain.   Baseline pain with shoulder flexion greater than 100*   Time 4   Period Weeks   Status New           OT Long Term Goals - 04/30/17 1335      OT LONG TERM GOAL #1   Title Patient will complete an updated home exercise program designed for UE strengthening daily--check LTGs 04/26/17   Time 8   Period Weeks   Status Partially Met  Pt reports performing every other day     OT LONG TERM GOAL #2   Title Patient will prepare simple hot meal for she and her husband with no rest breaks- 30 min of activity   Status Achieved     OT LONG TERM GOAL #3   Title Patient will complete laundry task- including transporting items to washer, and transferring to dryer, folding and putting away clothing with intermittent assistance only   Status Achieved     OT LONG TERM GOAL #4   Title Patient will utilize bilateral upper extremities to blow dry and style her hair with modified independence   Time 8   Period Weeks   Status On-going  requires assistance for the back of her hair from husband     OT LONG TERM GOAL #5   Title Patient will demonstrate sufficient UE/LE strength and balance to reach to floor to  retrieve a lightweight (no more than 2 lb) object without loss of balance   Time --   Period --   Status Achieved     OT LONG TERM GOAL #6   Title Patient will demonstrate sufficient strength for unilateral reach to retrieve a 3 lb object from overhead shelf while standing with right hand and left hand (  not bilateral reach)   Time --   Period --   Status Partially Met  able to retrieve however pt demonstrates mod compensations with RUE     OT LONG TERM GOAL #7   Title Patient will demonstrate sufficient strength to get down to floor to clean tub, and get back up without assistance- LTG's due 06/27/17   Time 8   Period Weeks   Status On-going  partially met     OT LONG TERM GOAL #8   Title Pt will demonstrate ability to retrieve a 3 lbs weight from overhead shelf with RUE with no more than min compensations.   Baseline mod compensations retrieveing 3lbs weight from overhead shelf with RUE   Time 8   Period Weeks   Status New     OT LONG TERM GOAL  #9   Baseline Pt will resume performance of mod complex cooking and home management tasks at a modified independent level.   Time 8   Period Weeks   Status New               Plan - 05/14/17 1158    Clinical Impression Statement Pt is progressing towards goals with improving UE strengh and functional use.   Rehab Potential Good   OT Frequency 2x / week   OT Duration 8 weeks   OT Treatment/Interventions Self-care/ADL training;Electrical Stimulation;Therapeutic exercise;Neuromuscular education;Energy conservation;Manual Therapy;Functional Mobility Training;DME and/or AE instruction;Visual/perceptual remediation/compensation;Cognitive remediation/compensation;Therapeutic activities;Balance training;Patient/family education;Therapeutic exercises;Passive range of motion   Plan neuro re-ed, UE functional use   OT Home Exercise Plan Added to HEP - shoulder strengthening   Consulted and Agree with Plan of Care Patient;Family  member/caregiver      Patient will benefit from skilled therapeutic intervention in order to improve the following deficits and impairments:  Decreased coordination, Decreased range of motion, Decreased endurance, Decreased safety awareness, Decreased balance, Decreased cognition, Decreased strength, Impaired perceived functional ability, Impaired vision/preception, Impaired UE functional use, Impaired tone, Pain, Impaired sensation, Improper body mechanics, Decreased activity tolerance  Visit Diagnosis: Muscle weakness (generalized)  Other lack of coordination  Other disturbances of skin sensation    Problem List Patient Active Problem List   Diagnosis Date Noted  . Bilateral rotator cuff syndrome 04/22/2017  . Acute blood loss anemia   . Physical deconditioning   . Tracheostomy status ([place])   . Hypoxemia   . Dysphagia   . Numbness and tingling   . S/P percutaneous endoscopic gastrostomy (PEG) tube placement ([place])   . Bradycardia   . Left corneal abrasion   . Abnormal chest x-ray   . Acute respiratory failure with hypoxia ([place])   . Atrial fibrillation with RVR ([place])   . Dysphagia, pharyngoesophageal phase   . History of ETT   . Encounter for central line placement   . Encounter for feeding tube placement   . Weakness of right lower extremity   . SOB (shortness of breath)   . Pneumonia   . Pressure injury of skin 01/16/2017  . Acute respiratory failure ([place])   . Hyponatremia with decreased serum osmolality   . Hyponatremia   . Quadriplegia and quadriparesis ([place]) 01/14/2017  . GBS (Guillain Barre syndrome) ([place]) 01/13/2017  . Weakness 01/12/2017  . Hypokalemia 01/12/2017  . Endometriosis 12/05/2013  . Chronic low back pain 08/19/2012  . HYPERLIPIDEMIA 12/10/2010  . COLONIC POLYPS, HX OF 12/13/2009  . HYPERGLYCEMIA, FASTING 11/22/2008  . Depression 02/07/2008  . ANOSMIA 02/07/2008  . HEADACHE, CHRONIC 02/07/2008  . MITRAL  VALVE PROLAPSE 10/05/2007  . OSTEOPENIA  10/05/2007    Joanne Morrison 05/14/2017, 11:59 AM Theone Murdoch, OTR/L [phone] Phone: [phone] 12:10 PM 05/14/17 [facility] [address] [place], [place], [address] Phone: [phone]   Fax:  [phone]  Name: Joanne Morrison MRN: [id number] Date of Birth: 11-10-1951

## 2017-05-19 ENCOUNTER — Ambulatory Visit: Payer: BLUE CROSS/BLUE SHIELD | Admitting: Occupational Therapy

## 2017-05-21 ENCOUNTER — Encounter: Payer: BLUE CROSS/BLUE SHIELD | Admitting: Occupational Therapy

## 2017-05-22 ENCOUNTER — Ambulatory Visit: Payer: BLUE CROSS/BLUE SHIELD | Attending: Physical Medicine & Rehabilitation | Admitting: Occupational Therapy

## 2017-05-22 DIAGNOSIS — R208 Other disturbances of skin sensation: Secondary | ICD-10-CM | POA: Insufficient documentation

## 2017-05-22 DIAGNOSIS — M25512 Pain in left shoulder: Secondary | ICD-10-CM | POA: Insufficient documentation

## 2017-05-22 DIAGNOSIS — M6281 Muscle weakness (generalized): Secondary | ICD-10-CM | POA: Insufficient documentation

## 2017-05-22 DIAGNOSIS — R2681 Unsteadiness on feet: Secondary | ICD-10-CM | POA: Insufficient documentation

## 2017-05-22 DIAGNOSIS — R278 Other lack of coordination: Secondary | ICD-10-CM | POA: Insufficient documentation

## 2017-05-22 NOTE — Therapy (Signed)
[facility] [address] [place], [place], [address] Phone: [phone]   Fax:  [phone]  Occupational Therapy Treatment  Patient Details  Name: Joanne Morrison MRN: [id number] Date of Birth: 04-Dec-1951 Referring Provider: Dr Riley Kill  Encounter Date: 05/22/2017      OT End of Session - 05/22/17 1655    Visit Number 27   Number of Visits 37   Date for OT Re-Evaluation 07/29/17   Authorization Type BCBS  no visit limit   Authorization - Visit Number 22   OT Start Time 0850   OT Stop Time 0930   OT Time Calculation (min) 40 min      Past Medical History:  Diagnosis Date  . Anxiety   . Arthritis    back  . Chronic cystitis   . Chronic low back pain   . Depression   . Diverticulosis of colon   . Hemorrhoids   . History of colon polyps    09-24-2009  rectal hyperplastic polyp/   and 2016 non-cancerous  . History of endometriosis   . History of panic attacks   . Hyperlipidemia   . Osteopenia   . Pelvic pain   . Sensation of pressure in bladder area   . Wears glasses     Past Surgical History:  Procedure Laterality Date  . BUNIONECTOMY Left 2004 approx  . CARPAL TUNNEL RELEASE Bilateral right 2014;  left 2007  . COLONOSCOPY W/ POLYPECTOMY  09/24/2009  . CYSTO WITH HYDRODISTENSION [name] 10/15/2016   Procedure: CYSTOSCOPY/HYDRODISTENSION AND MARCAINE INSTILLATION;  Surgeon: Barron Alvine, MD;  Location: [facility];  Service: Urology;  Laterality: [name];  . CYSTO/  HYDRODISTENTION/  INSTILLATION THERAPY  1997 approx  . HYSTEROSCOPY W/D&C  11/01/2004   POLYPECTOMY  . IR GASTROSTOMY TUBE REMOVAL  03/20/2017  . IR GENERIC HISTORICAL  01/26/2017   IR GASTROSTOMY TUBE MOD SED 01/26/2017 Simonne Come, MD [facility]INTER[facility] RAD  . LAPAROSCOPIC VAGINAL HYSTERECTOMY WITH SALPINGO OOPHORECTOMY Bilateral 04/20/2012  . TUBAL LIGATION  yrs ago    There were no vitals filed for this visit.      Subjective Assessment  - 05/22/17 1657    Subjective  Pt denies pain today   Pertinent History hx of back pain (went to pain management center)   Currently in Pain? No/denies                Treatment:In supine,shoulder flexion and  chest press, with BUEs with min cues/facilitation.  In prone, wt. Bearing on elbows with chest/head lift for scapular depression with min cues.  Scapular retraction with shoulders positioned in extension and abduction with min cueing.  Low range wall push ups with therapist facilitating right scapula, then unilateral wall slides with RUE, therapist facilitating elbow position   Hemiglide for LUE shoulder flexion AA/ROM, AA/ROM with RUE holding pole to move forwards and backwards with min facilitation.  UE ranger for midrange RUE AA/ROM, mod facilitation at right scapula  Rolling ball forwards and backwards while standing facing mat for bilateral shoulder flexion AA/ROM.  Low range functional reaching with RUE and mid-high range functional reaching with LUE, min facilitation and min v.c to avoid compensation, frequent rest breaks required.                  OT Short Term Goals - 04/30/17 1335      OT SHORT TERM GOAL #1   Title Patient will complete a home activity program daily with min prompting or  set up --check STGs 03/27/17   Status Achieved     OT SHORT TERM GOAL #2   Title Patient will complete a home exercise program designed to improve range of motion in BUE's with no more than set up assistance   Status Achieved     OT SHORT TERM GOAL #3   Title Patient will utilize bilateral upper extremities to cut a piece of meat during a meal with modified independence   Status Achieved     OT SHORT TERM GOAL #4   Title Patient will don compression hose with increased time and without physical assistance   Status Achieved     OT SHORT TERM GOAL #5   Title Patient will demonstrate sufficient activity tolerance to complete light housekeeping task in standing  for greater than 5 minutes.     Status Achieved     OT SHORT TERM GOAL #6   Title Patient will demonstrate effective mid level reach to retrieve lightweight item less than 2 lbs from chest height shelf in standing using BUE's   Status Achieved     OT SHORT TERM GOAL #7   Title I with updated HEP- due 05/29/17   Time 4   Period Weeks   Status New     OT SHORT TERM GOAL #8   Title Pt will demonstrate RUE shoulder flexion to retrieve a lightweight object at 60 with only minimal compensations/ winging.   Baseline 40* prior to compensations/ winging   Time 4   Period Weeks   Status New     OT SHORT TERM GOAL  #9   TITLE Pt will demonstrate ability to retrieve a lightweight object at 110 shoulder flexion with LUE without reports of pain.   Baseline pain with shoulder flexion greater than 100*   Time 4   Period Weeks   Status New           OT Long Term Goals - 04/30/17 1335      OT LONG TERM GOAL #1   Title Patient will complete an updated home exercise program designed for UE strengthening daily--check LTGs 04/26/17   Time 8   Period Weeks   Status Partially Met  Pt reports performing every other day     OT LONG TERM GOAL #2   Title Patient will prepare simple hot meal for she and her husband with no rest breaks- 30 min of activity   Status Achieved     OT LONG TERM GOAL #3   Title Patient will complete laundry task- including transporting items to washer, and transferring to dryer, folding and putting away clothing with intermittent assistance only   Status Achieved     OT LONG TERM GOAL #4   Title Patient will utilize bilateral upper extremities to blow dry and style her hair with modified independence   Time 8   Period Weeks   Status On-going  requires assistance for the back of her hair from husband     OT LONG TERM GOAL #5   Title Patient will demonstrate sufficient UE/LE strength and balance to reach to floor to retrieve a lightweight (no more than 2 lb) object  without loss of balance   Time --   Period --   Status Achieved     OT LONG TERM GOAL #6   Title Patient will demonstrate sufficient strength for unilateral reach to retrieve a 3 lb object from overhead shelf while standing with right hand and left hand (not bilateral reach)  Time --   Period --   Status Partially Met  able to retrieve however pt demonstrates mod compensations with RUE     OT LONG TERM GOAL #7   Title Patient will demonstrate sufficient strength to get down to floor to clean tub, and get back up without assistance- LTG's due 06/27/17   Time 8   Period Weeks   Status On-going  partially met     OT LONG TERM GOAL #8   Title Pt will demonstrate ability to retrieve a 3 lbs weight from overhead shelf with RUE with no more than min compensations.   Baseline mod compensations retrieveing 3lbs weight from overhead shelf with RUE   Time 8   Period Weeks   Status New     OT LONG TERM GOAL  #9   Baseline Pt will resume performance of mod complex cooking and home management tasks at a modified independent level.   Time 8   Period Weeks   Status New               Plan - 05/22/17 1656    Clinical Impression Statement Pt is progressing towards goals with improving UE strength and functional use   Rehab Potential Good   OT Frequency 2x / week   OT Duration 8 weeks   OT Treatment/Interventions Self-care/ADL training;Electrical Stimulation;Therapeutic exercise;Neuromuscular education;Energy conservation;Manual Therapy;Functional Mobility Training;DME and/or AE instruction;Visual/perceptual remediation/compensation;Cognitive remediation/compensation;Therapeutic activities;Balance training;Patient/family education;Therapeutic exercises;Passive range of motion   Plan neuro re-ed   OT Home Exercise Plan Added to HEP - shoulder strengthening   Consulted and Agree with Plan of Care Patient;Family member/caregiver      Patient will benefit from skilled therapeutic  intervention in order to improve the following deficits and impairments:     Visit Diagnosis: Muscle weakness (generalized)  Other lack of coordination  Other disturbances of skin sensation    Problem List Patient Active Problem List   Diagnosis Date Noted  . Bilateral rotator cuff syndrome 04/22/2017  . Acute blood loss anemia   . Physical deconditioning   . Tracheostomy status ([place])   . Hypoxemia   . Dysphagia   . Numbness and tingling   . S/P percutaneous endoscopic gastrostomy (PEG) tube placement ([place])   . Bradycardia   . Left corneal abrasion   . Abnormal chest x-ray   . Acute respiratory failure with hypoxia ([place])   . Atrial fibrillation with RVR ([place])   . Dysphagia, pharyngoesophageal phase   . History of ETT   . Encounter for central line placement   . Encounter for feeding tube placement   . Weakness of right lower extremity   . SOB (shortness of breath)   . Pneumonia   . Pressure injury of skin 01/16/2017  . Acute respiratory failure ([place])   . Hyponatremia with decreased serum osmolality   . Hyponatremia   . Quadriplegia and quadriparesis ([place]) 01/14/2017  . GBS (Guillain Barre syndrome) ([place]) 01/13/2017  . Weakness 01/12/2017  . Hypokalemia 01/12/2017  . Endometriosis 12/05/2013  . Chronic low back pain 08/19/2012  . HYPERLIPIDEMIA 12/10/2010  . COLONIC POLYPS, HX OF 12/13/2009  . HYPERGLYCEMIA, FASTING 11/22/2008  . Depression 02/07/2008  . ANOSMIA 02/07/2008  . HEADACHE, CHRONIC 02/07/2008  . MITRAL VALVE PROLAPSE 10/05/2007  . OSTEOPENIA 10/05/2007    Lennart Gladish 05/22/2017, 4:58 PM  [facility] [address] [place], [place], [address] Phone: [phone]   Fax:  [phone]  Name: Joanne Morrison MRN: [id number] Date of  Birth: Nov 21, 1951

## 2017-05-26 ENCOUNTER — Ambulatory Visit: Payer: BLUE CROSS/BLUE SHIELD | Admitting: Occupational Therapy

## 2017-05-28 ENCOUNTER — Ambulatory Visit: Payer: BLUE CROSS/BLUE SHIELD | Admitting: Occupational Therapy

## 2017-05-28 DIAGNOSIS — M6281 Muscle weakness (generalized): Secondary | ICD-10-CM | POA: Diagnosis not present

## 2017-05-28 DIAGNOSIS — R278 Other lack of coordination: Secondary | ICD-10-CM

## 2017-05-28 DIAGNOSIS — R208 Other disturbances of skin sensation: Secondary | ICD-10-CM

## 2017-05-28 NOTE — Therapy (Signed)
[facility] [address] [place], [place], [address] Phone: [phone]   Fax:  [phone]  Occupational Therapy Treatment  Patient Details  Name: Joanne Morrison MRN: [id number] Date of Birth: 09-22-1951 Referring Provider: Dr Naaman Plummer  Encounter Date: 05/28/2017      OT End of Session - 05/28/17 1238    Visit Number 28   Number of Visits 37   Date for OT Re-Evaluation 07/29/17   Authorization Type BCBS  no visit limit   Authorization - Visit Number 28   OT Start Time 0935   OT Stop Time 1015   OT Time Calculation (min) 40 min      Past Medical History:  Diagnosis Date  . Anxiety   . Arthritis    back  . Chronic cystitis   . Chronic low back pain   . Depression   . Diverticulosis of colon   . Hemorrhoids   . History of colon polyps    09-24-2009  rectal hyperplastic polyp/   and 2016 non-cancerous  . History of endometriosis   . History of panic attacks   . Hyperlipidemia   . Osteopenia   . Pelvic pain   . Sensation of pressure in bladder area   . Wears glasses     Past Surgical History:  Procedure Laterality Date  . BUNIONECTOMY Left 2004 approx  . CARPAL TUNNEL RELEASE Bilateral right 2014;  left 2007  . COLONOSCOPY W/ POLYPECTOMY  09/24/2009  . CYSTO WITH HYDRODISTENSION [name] 10/15/2016   Procedure: CYSTOSCOPY/HYDRODISTENSION AND MARCAINE INSTILLATION;  Surgeon: Rana Snare, MD;  Location[facility];  Service: Urology;  Laterality: [name];  . CYSTO/  HYDRODISTENTION/  INSTILLATION THERAPY  1997 approx  . HYSTEROSCOPY W/D&C  11/01/2004   POLYPECTOMY  . IR GASTROSTOMY TUBE REMOVAL  03/20/2017  . IR GENERIC HISTORICAL  01/26/2017   IR GASTROSTOMY TUBE MOD SED 01/26/2017 Sandi Mariscal, MD [facility]  . LAPAROSCOPIC VAGINAL HYSTERECTOMY WITH SALPINGO OOPHORECTOMY Bilateral 04/20/2012  . TUBAL LIGATION  yrs ago    There were no vitals filed for this visit.      Subjective Assessment  - 05/28/17 0939    Subjective  Pt denies pain today   Pertinent History hx of back pain (went to pain management center)   Currently in Pain? No/denies         Treatment: In supine,shoulder flexion and  chest press, with BUEs with min cues/facilitation.  Followed by supine unilateral chest press 10 reps  With RUE and 5 reps with LUE holding 2 # weight  followed by bilateral chest press x 10 holding 2# weight between hands, min v.c for positioning  Unilateral shoulder circles with left and right UE's holding 1 lbs weight 2 sets of 10 reps,  Supine unilateral shoulder flexion RUE with 1 # weight x 10 reps  Low range wall push ups with therapist cueing for proper positoning   Low range functional reaching with RUE and mid-high range functional reaching with LUE, min facilitation and min v.c to avoid compensation, frequent rest breaks required.  Arm bike x 5 mins level 1 with seat elevated, min v.c for positioning for reciprocal movement                                  OT Short Term Goals - 05/28/17 1100      OT SHORT TERM GOAL #7   Title  I with updated HEP- due 05/29/17   Time 4   Period Weeks   Status Achieved     OT SHORT TERM GOAL #8   Title Pt will demonstrate RUE shoulder flexion to retrieve a lightweight object at 60 with only minimal compensations/ winging.   Time 4   Period Weeks   Status On-going     OT SHORT TERM GOAL  #9   TITLE Pt will demonstrate ability to retrieve a lightweight object at 110 shoulder flexion with LUE without reports of pain.   Time 4   Period Weeks   Status On-going           OT Long Term Goals - 05/28/17 1101      OT LONG TERM GOAL #4   Title Patient will utilize bilateral upper extremities to blow dry and style her hair with modified independence   Time 8   Period Weeks   Status On-going     OT LONG TERM GOAL #5   Time 8     OT LONG TERM GOAL #7   Title Patient will demonstrate sufficient strength  to get down to floor to clean tub, and get back up without assistance- LTG's due 06/27/17   Time 8   Period Weeks   Status On-going     OT LONG TERM GOAL #8   Title Pt will demonstrate ability to retrieve a 3 lbs weight from overhead shelf with RUE with no more than min compensations.   Time 8   Period Weeks   Status On-going     OT LONG TERM GOAL  #9   Baseline Pt will resume performance of mod complex cooking and home management tasks at a modified independent level.   Period Weeks   Status On-going               Plan - 05/28/17 1102    Clinical Impression Statement Pt is progressing towards goals.      Patient will benefit from skilled therapeutic intervention in order to improve the following deficits and impairments:     Visit Diagnosis: Muscle weakness (generalized)  Other lack of coordination  Other disturbances of skin sensation    Problem List Patient Active Problem List   Diagnosis Date Noted  . Bilateral rotator cuff syndrome 04/22/2017  . Acute blood loss anemia   . Physical deconditioning   . Tracheostomy status ([place])   . Hypoxemia   . Dysphagia   . Numbness and tingling   . S/P percutaneous endoscopic gastrostomy (PEG) tube placement ([place])   . Bradycardia   . Left corneal abrasion   . Abnormal chest x-ray   . Acute respiratory failure with hypoxia ([place])   . Atrial fibrillation with RVR ([place])   . Dysphagia, pharyngoesophageal phase   . History of ETT   . Encounter for central line placement   . Encounter for feeding tube placement   . Weakness of right lower extremity   . SOB (shortness of breath)   . Pneumonia   . Pressure injury of skin 01/16/2017  . Acute respiratory failure ([place])   . Hyponatremia with decreased serum osmolality   . Hyponatremia   . Quadriplegia and quadriparesis ([place]) 01/14/2017  . GBS (Guillain Barre syndrome) ([place]) 01/13/2017  . Weakness 01/12/2017  . Hypokalemia 01/12/2017  . Endometriosis 12/05/2013  .  Chronic low back pain 08/19/2012  . HYPERLIPIDEMIA 12/10/2010  . COLONIC POLYPS, HX OF 12/13/2009  . HYPERGLYCEMIA, FASTING 11/22/2008  . Depression 02/07/2008  .  ANOSMIA 02/07/2008  . HEADACHE, CHRONIC 02/07/2008  . MITRAL VALVE PROLAPSE 10/05/2007  . OSTEOPENIA 10/05/2007    [place] 05/28/2017, 12:40 PM  [facility] [address] [place], [place], [address] Phone: [phone]   Fax:  [phone]  Name: Joanne Morrison MRN: [id number] Date of Birth: [birth year]

## 2017-06-01 ENCOUNTER — Ambulatory Visit: Payer: BLUE CROSS/BLUE SHIELD | Admitting: Occupational Therapy

## 2017-06-01 ENCOUNTER — Encounter: Payer: Self-pay | Admitting: Occupational Therapy

## 2017-06-01 DIAGNOSIS — M25512 Pain in left shoulder: Secondary | ICD-10-CM

## 2017-06-01 DIAGNOSIS — M6281 Muscle weakness (generalized): Secondary | ICD-10-CM

## 2017-06-01 DIAGNOSIS — R2681 Unsteadiness on feet: Secondary | ICD-10-CM

## 2017-06-01 DIAGNOSIS — R208 Other disturbances of skin sensation: Secondary | ICD-10-CM

## 2017-06-01 DIAGNOSIS — R278 Other lack of coordination: Secondary | ICD-10-CM

## 2017-06-01 NOTE — Therapy (Signed)
[facility] [address] [place], [place], [address] Phone: [phone]   Fax:  [phone]  Occupational Therapy Treatment  Patient Details  Name: Joanne Morrison MRN: [id number] Date of Birth: [birth year] Referring Provider: Dr Naaman Plummer  Encounter Date: 06/01/2017      OT End of Session - 06/01/17 1118    Visit Number 29   Number of Visits 37   Date for OT Re-Evaluation 07/29/17   Authorization Type BCBS  no visit limit   Authorization - Visit Number 14   OT Start Time 1017   OT Stop Time 1100   OT Time Calculation (min) 43 min   Activity Tolerance Patient tolerated treatment well   Behavior During Therapy [facility] for tasks assessed/performed      Past Medical History:  Diagnosis Date  . Anxiety   . Arthritis    back  . Chronic cystitis   . Chronic low back pain   . Depression   . Diverticulosis of colon   . Hemorrhoids   . History of colon polyps    09-24-2009  rectal hyperplastic polyp/   and 2016 non-cancerous  . History of endometriosis   . History of panic attacks   . Hyperlipidemia   . Osteopenia   . Pelvic pain   . Sensation of pressure in bladder area   . Wears glasses     Past Surgical History:  Procedure Laterality Date  . BUNIONECTOMY Left 2004 approx  . CARPAL TUNNEL RELEASE Bilateral right 2014;  left 2007  . COLONOSCOPY W/ POLYPECTOMY  09/24/2009  . CYSTO WITH HYDRODISTENSION [name] 10/15/2016   Procedure: CYSTOSCOPY/HYDRODISTENSION AND MARCAINE INSTILLATION;  Surgeon: Rana Snare, MD;  Location: [facility];  Service: Urology;  Laterality: [name];  . CYSTO/  HYDRODISTENTION/  INSTILLATION THERAPY  1997 approx  . HYSTEROSCOPY W/D&C  11/01/2004   POLYPECTOMY  . IR GASTROSTOMY TUBE REMOVAL  03/20/2017  . IR GENERIC HISTORICAL  01/26/2017   IR GASTROSTOMY TUBE MOD SED 01/26/2017 Sandi Mariscal, MD [facility]  . LAPAROSCOPIC VAGINAL HYSTERECTOMY WITH SALPINGO OOPHORECTOMY Bilateral  04/20/2012  . TUBAL LIGATION  yrs ago    There were no vitals filed for this visit.      Subjective Assessment - 06/01/17 1020    Subjective  He gave me some medicine for arthritis pain   Patient is accompained by: Family member   Pertinent History hx of back pain (went to pain management center)   Currently in Pain? Yes   Pain Score 2    Pain Location Arm   Pain Orientation Left   Pain Descriptors / Indicators Aching   Pain Type Chronic pain   Pain Onset More than a month ago                      OT Treatments/Exercises (OP) - 06/01/17 0001      Neurological Re-education Exercises   Other Exercises 1 Neuromuscular reeducation to address biomechanics of reach pattern in left UE, and proximal strengthening in right.  Patient able to activate left external rotators in supine, with facilitation and feedback for correct motor pattern.  Patient even able to tolerate slight resistance of humerus placed in slight flexion and slight abduction.  Patient ha ddifficulty recruiting same motor pattern in sitting.  Used yellow theraband to give proprioceptive input to maintain humeri in dependent position to help elicit true external rotation in left arm.  (Used clock analogy - patient able to get  to neutral ~12 o'clock) this session, without assistance, and about 5 degrees further with faciltiation techniques.     Other Exercises 2 Prone on elbows to address use of sustained ground reaction force for scapular stabilization.                   OT Education - 06/01/17 1117    Education provided Yes   Education Details prone on elbows with chest lift for scapular stabilization and sustained control   Person(s) Educated Patient;Spouse   Methods Explanation;Demonstration;Tactile cues;Verbal cues   Comprehension Verbalized understanding;Returned demonstration;Need further instruction          OT Short Term Goals - 05/28/17 1100      OT SHORT TERM GOAL #7   Title I with  updated HEP- due 05/29/17   Time 4   Period Weeks   Status Achieved     OT SHORT TERM GOAL #8   Title Pt will demonstrate RUE shoulder flexion to retrieve a lightweight object at 60 with only minimal compensations/ winging.   Time 4   Period Weeks   Status On-going     OT SHORT TERM GOAL  #9   TITLE Pt will demonstrate ability to retrieve a lightweight object at 110 shoulder flexion with LUE without reports of pain.   Time 4   Period Weeks   Status On-going           OT Long Term Goals - 05/28/17 1101      OT LONG TERM GOAL #4   Title Patient will utilize bilateral upper extremities to blow dry and style her hair with modified independence   Time 8   Period Weeks   Status On-going     OT LONG TERM GOAL #5   Time 8     OT LONG TERM GOAL #7   Title Patient will demonstrate sufficient strength to get down to floor to clean tub, and get back up without assistance- LTG's due 06/27/17   Time 8   Period Weeks   Status On-going     OT LONG TERM GOAL #8   Title Pt will demonstrate ability to retrieve a 3 lbs weight from overhead shelf with RUE with no more than min compensations.   Time 8   Period Weeks   Status On-going     OT LONG TERM GOAL  #9   Baseline Pt will resume performance of mod complex cooking and home management tasks at a modified independent level.   Period Weeks   Status On-going               Plan - 06/01/17 1118    Clinical Impression Statement Patient is showing steady progress toward OT goals.     Rehab Potential Good   OT Frequency 2x / week   OT Duration 8 weeks   OT Treatment/Interventions Self-care/ADL training;Electrical Stimulation;Therapeutic exercise;Neuromuscular education;Energy conservation;Manual Therapy;Functional Mobility Training;DME and/or AE instruction;Visual/perceptual remediation/compensation;Cognitive remediation/compensation;Therapeutic activities;Balance training;Patient/family education;Therapeutic exercises;Passive  range of motion   Plan UE strength and mecahnics for reach, functional use of UEs   OT Home Exercise Plan Added to HEP - shoulder strengthening   Consulted and Agree with Plan of Care Patient;Family member/caregiver      Patient will benefit from skilled therapeutic intervention in order to improve the following deficits and impairments:  Decreased coordination, Decreased range of motion, Decreased endurance, Decreased safety awareness, Decreased balance, Decreased cognition, Decreased strength, Impaired perceived functional ability, Impaired vision/preception, Impaired UE functional use, Impaired tone,  Pain, Impaired sensation, Improper body mechanics, Decreased activity tolerance  Visit Diagnosis: Muscle weakness (generalized)  Other lack of coordination  Other disturbances of skin sensation  Unsteadiness on feet  Acute pain of left shoulder    Problem List Patient Active Problem List   Diagnosis Date Noted  . Bilateral rotator cuff syndrome 04/22/2017  . Acute blood loss anemia   . Physical deconditioning   . Tracheostomy status ([place])   . Hypoxemia   . Dysphagia   . Numbness and tingling   . S/P percutaneous endoscopic gastrostomy (PEG) tube placement ([place])   . Bradycardia   . Left corneal abrasion   . Abnormal chest x-ray   . Acute respiratory failure with hypoxia ([place])   . Atrial fibrillation with RVR ([place])   . Dysphagia, pharyngoesophageal phase   . History of ETT   . Encounter for central line placement   . Encounter for feeding tube placement   . Weakness of right lower extremity   . SOB (shortness of breath)   . Pneumonia   . Pressure injury of skin 01/16/2017  . Acute respiratory failure ([place])   . Hyponatremia with decreased serum osmolality   . Hyponatremia   . Quadriplegia and quadriparesis ([place]) 01/14/2017  . GBS (Guillain Barre syndrome) ([place]) 01/13/2017  . Weakness 01/12/2017  . Hypokalemia 01/12/2017  . Endometriosis 12/05/2013  . Chronic low back  pain 08/19/2012  . HYPERLIPIDEMIA 12/10/2010  . COLONIC POLYPS, HX OF 12/13/2009  . HYPERGLYCEMIA, FASTING 11/22/2008  . Depression 02/07/2008  . ANOSMIA 02/07/2008  . HEADACHE, CHRONIC 02/07/2008  . MITRAL VALVE PROLAPSE 10/05/2007  . OSTEOPENIA 10/05/2007    Mariah Milling, OTR/L 06/01/2017, 11:19 AM  [facility] [address] [place], [place], [address] Phone: [phone]   Fax:  [phone]  Name: Joanne Morrison MRN: [id number] Date of Birth: 04-29-1951

## 2017-06-05 ENCOUNTER — Encounter: Payer: Self-pay | Admitting: Occupational Therapy

## 2017-06-05 ENCOUNTER — Ambulatory Visit: Payer: BLUE CROSS/BLUE SHIELD | Admitting: Occupational Therapy

## 2017-06-05 DIAGNOSIS — R208 Other disturbances of skin sensation: Secondary | ICD-10-CM

## 2017-06-05 DIAGNOSIS — M6281 Muscle weakness (generalized): Secondary | ICD-10-CM | POA: Diagnosis not present

## 2017-06-05 DIAGNOSIS — M25512 Pain in left shoulder: Secondary | ICD-10-CM

## 2017-06-05 DIAGNOSIS — R2681 Unsteadiness on feet: Secondary | ICD-10-CM

## 2017-06-05 DIAGNOSIS — R278 Other lack of coordination: Secondary | ICD-10-CM

## 2017-06-05 NOTE — Therapy (Signed)
[facility] [address] [place], [place], [address] Phone: [phone]   Fax:  [phone]  Occupational Therapy Treatment  Patient Details  Name: Joanne Morrison MRN: [id number] Date of Birth: December 25, 1950 Referring Provider: Dr Naaman Plummer  Encounter Date: 06/05/2017      OT End of Session - 06/05/17 1256    Visit Number 30   Number of Visits 37   Date for OT Re-Evaluation 07/29/17   Authorization Type BCBS  no visit limit   Authorization - Visit Number 30   OT Start Time 1147   OT Stop Time 1230   OT Time Calculation (min) 43 min   Activity Tolerance Patient tolerated treatment well   Behavior During Therapy [facility] for tasks assessed/performed      Past Medical History:  Diagnosis Date  . Anxiety   . Arthritis    back  . Chronic cystitis   . Chronic low back pain   . Depression   . Diverticulosis of colon   . Hemorrhoids   . History of colon polyps    09-24-2009  rectal hyperplastic polyp/   and 2016 non-cancerous  . History of endometriosis   . History of panic attacks   . Hyperlipidemia   . Osteopenia   . Pelvic pain   . Sensation of pressure in bladder area   . Wears glasses     Past Surgical History:  Procedure Laterality Date  . BUNIONECTOMY Left 2004 approx  . CARPAL TUNNEL RELEASE Bilateral right 2014;  left 2007  . COLONOSCOPY W/ POLYPECTOMY  09/24/2009  . CYSTO WITH HYDRODISTENSION [name] 10/15/2016   Procedure: CYSTOSCOPY/HYDRODISTENSION AND MARCAINE INSTILLATION;  Surgeon: Rana Snare, MD;  Location: [facility];  Service: Urology;  Laterality: [name];  . CYSTO/  HYDRODISTENTION/  INSTILLATION THERAPY  1997 approx  . HYSTEROSCOPY W/D&C  11/01/2004   POLYPECTOMY  . IR GASTROSTOMY TUBE REMOVAL  03/20/2017  . IR GENERIC HISTORICAL  01/26/2017   IR GASTROSTOMY TUBE MOD SED 01/26/2017 Sandi Mariscal, MD [facility]  . LAPAROSCOPIC VAGINAL HYSTERECTOMY WITH SALPINGO OOPHORECTOMY Bilateral  04/20/2012  . TUBAL LIGATION  yrs ago    There were no vitals filed for this visit.      Subjective Assessment - 06/05/17 1153    Subjective  I wore myself out yesterday.  Once my arm is warmed up it can really go!   Patient is accompained by: Family member   Pertinent History hx of back pain (went to pain management center)   Currently in Pain? Yes   Pain Score 1    Pain Location Arm   Pain Orientation Left   Pain Descriptors / Indicators Aching   Pain Type Chronic pain   Pain Onset More than a month ago   Pain Frequency Intermittent   Aggravating Factors  over work   Pain Relieving Factors rest                      OT Treatments/Exercises (OP) - 06/05/17 0001      ADLs   Grooming Patient reports that she is now able to blow dry her hair using both hands - although she fatigues quickly with this activity.       Neurological Re-education Exercises   Other Exercises 1 Neuromuscular reeducation to address proximal strengthening of upper extremities.  Worked in PNF diagonals in supine with 4 lb cuff weights.  Worked on isolating external rotation in left shoulder.  Other Exercises 2 Seated on ball to incorporate more core activity, worked on bilateral PNF diagonals, and trunk rotation.                  OT Education - June 18, 2017 1255    Education provided Yes   Education Details Importance of postural control for UE strength and function   Person(s) Educated Patient;Spouse   Methods Explanation;Demonstration;Tactile cues;Verbal cues   Comprehension Verbalized understanding;Returned demonstration;Need further instruction          OT Short Term Goals - 05/28/17 1100      OT SHORT TERM GOAL #7   Title I with updated HEP- due 05/29/17   Time 4   Period Weeks   Status Achieved     OT SHORT TERM GOAL #8   Title Pt will demonstrate RUE shoulder flexion to retrieve a lightweight object at 60 with only minimal compensations/ winging.   Time 4    Period Weeks   Status On-going     OT SHORT TERM GOAL  #9   TITLE Pt will demonstrate ability to retrieve a lightweight object at 110 shoulder flexion with LUE without reports of pain.   Time 4   Period Weeks   Status On-going           OT Long Term Goals - 18-Jun-2017 1258      OT LONG TERM GOAL #4   Title Patient will utilize bilateral upper extremities to blow dry and style her hair with modified independence   Status Achieved               Plan - 18-Jun-2017 1256    Clinical Impression Statement Patient continues to progress with her ability to perform ADL/IADL due to improved balance, improved postural control, and improved extremity strength.     Rehab Potential Good   OT Frequency 2x / week   OT Duration 8 weeks   OT Treatment/Interventions Self-care/ADL training;Electrical Stimulation;Therapeutic exercise;Neuromuscular education;Energy conservation;Manual Therapy;Functional Mobility Training;DME and/or AE instruction;Visual/perceptual remediation/compensation;Cognitive remediation/compensation;Therapeutic activities;Balance training;Patient/family education;Therapeutic exercises;Passive range of motion   Plan UE strengthening and mechanics for mid to high reach (mid seated) high supine   OT Home Exercise Plan Added to HEP - shoulder strengthening   Consulted and Agree with Plan of Care Patient;Family member/caregiver      Patient will benefit from skilled therapeutic intervention in order to improve the following deficits and impairments:  Decreased coordination, Decreased range of motion, Decreased endurance, Decreased safety awareness, Decreased balance, Decreased cognition, Decreased strength, Impaired perceived functional ability, Impaired vision/preception, Impaired UE functional use, Impaired tone, Pain, Impaired sensation, Improper body mechanics, Decreased activity tolerance  Visit Diagnosis: Muscle weakness (generalized)  Other lack of coordination  Other  disturbances of skin sensation  Unsteadiness on feet  Acute pain of left shoulder      G-Codes - 2017-06-18 1259    Functional Limitation Carrying, moving and handling objects   Carrying, Moving and Handling Objects Current Status (Y1950) At least 40 percent but less than 60 percent impaired, limited or restricted   Carrying, Moving and Handling Objects Goal Status (D3267) At least 20 percent but less than 40 percent impaired, limited or restricted      Problem List Patient Active Problem List   Diagnosis Date Noted  . Bilateral rotator cuff syndrome 04/22/2017  . Acute blood loss anemia   . Physical deconditioning   . Tracheostomy status ([place])   . Hypoxemia   . Dysphagia   . Numbness and tingling   .  S/P percutaneous endoscopic gastrostomy (PEG) tube placement ([place])   . Bradycardia   . Left corneal abrasion   . Abnormal chest x-ray   . Acute respiratory failure with hypoxia ([place])   . Atrial fibrillation with RVR ([place])   . Dysphagia, pharyngoesophageal phase   . History of ETT   . Encounter for central line placement   . Encounter for feeding tube placement   . Weakness of right lower extremity   . SOB (shortness of breath)   . Pneumonia   . Pressure injury of skin 01/16/2017  . Acute respiratory failure ([place])   . Hyponatremia with decreased serum osmolality   . Hyponatremia   . Quadriplegia and quadriparesis ([place]) 01/14/2017  . GBS (Guillain Barre syndrome) ([place]) 01/13/2017  . Weakness 01/12/2017  . Hypokalemia 01/12/2017  . Endometriosis 12/05/2013  . Chronic low back pain 08/19/2012  . HYPERLIPIDEMIA 12/10/2010  . COLONIC POLYPS, HX OF 12/13/2009  . HYPERGLYCEMIA, FASTING 11/22/2008  . Depression 02/07/2008  . ANOSMIA 02/07/2008  . HEADACHE, CHRONIC 02/07/2008  . MITRAL VALVE PROLAPSE 10/05/2007  . OSTEOPENIA 10/05/2007    Mariah Milling , OTR/L 06/05/2017, 1:00 PM  [facility] [address] [place], [place], [address] Phone: [phone]   Fax:  [phone]  Name: Joanne Morrison MRN: [id number] Date of Birth: [birth year]

## 2017-06-09 ENCOUNTER — Ambulatory Visit: Payer: BLUE CROSS/BLUE SHIELD | Admitting: Occupational Therapy

## 2017-06-09 DIAGNOSIS — M6281 Muscle weakness (generalized): Secondary | ICD-10-CM

## 2017-06-09 DIAGNOSIS — R278 Other lack of coordination: Secondary | ICD-10-CM

## 2017-06-09 DIAGNOSIS — R2681 Unsteadiness on feet: Secondary | ICD-10-CM

## 2017-06-09 DIAGNOSIS — R208 Other disturbances of skin sensation: Secondary | ICD-10-CM

## 2017-06-09 NOTE — Therapy (Signed)
[facility] [address] [place], [place], [address] Phone: [phone]   Fax:  [phone]  Occupational Therapy Treatment  Patient Details  Name: Joanne Morrison MRN: [id number] Date of Birth: 12-09-1951 Referring Provider: Dr Naaman Plummer  Encounter Date: 06/09/2017      OT End of Session - 06/09/17 1309    Visit Number 31   Number of Visits 37   Date for OT Re-Evaluation 07/29/17   Authorization Type BCBS  no visit limit   Authorization - Visit Number 67   OT Start Time 0935   OT Stop Time 1015   OT Time Calculation (min) 40 min   Activity Tolerance Patient tolerated treatment well   Behavior During Therapy [facility] for tasks assessed/performed      Past Medical History:  Diagnosis Date  . Anxiety   . Arthritis    back  . Chronic cystitis   . Chronic low back pain   . Depression   . Diverticulosis of colon   . Hemorrhoids   . History of colon polyps    09-24-2009  rectal hyperplastic polyp/   and 2016 non-cancerous  . History of endometriosis   . History of panic attacks   . Hyperlipidemia   . Osteopenia   . Pelvic pain   . Sensation of pressure in bladder area   . Wears glasses     Past Surgical History:  Procedure Laterality Date  . BUNIONECTOMY Left 2004 approx  . CARPAL TUNNEL RELEASE Bilateral right 2014;  left 2007  . COLONOSCOPY W/ POLYPECTOMY  09/24/2009  . CYSTO WITH HYDRODISTENSION [name] 10/15/2016   Procedure: CYSTOSCOPY/HYDRODISTENSION AND MARCAINE INSTILLATION;  Surgeon: Rana Snare, MD;  Location: [facility];  Service: Urology;  Laterality: [name];  . CYSTO/  HYDRODISTENTION/  INSTILLATION THERAPY  1997 approx  . HYSTEROSCOPY W/D&C  11/01/2004   POLYPECTOMY  . IR GASTROSTOMY TUBE REMOVAL  03/20/2017  . IR GENERIC HISTORICAL  01/26/2017   IR GASTROSTOMY TUBE MOD SED 01/26/2017 Sandi Mariscal, MD [facility]  . LAPAROSCOPIC VAGINAL HYSTERECTOMY WITH SALPINGO OOPHORECTOMY Bilateral  04/20/2012  . TUBAL LIGATION  yrs ago    There were no vitals filed for this visit.      Subjective Assessment - 06/09/17 1308    Subjective  Pt reports only minimal pain   Pertinent History hx of back pain (went to pain management center)   Currently in Pain? Yes   Pain Score 1    Pain Location Arm   Pain Orientation Left   Pain Descriptors / Indicators Aching   Pain Type Chronic pain   Pain Onset More than a month ago   Pain Frequency Intermittent   Aggravating Factors  over work   Pain Relieving Factors rest              supine PNF patterns with 4 lbs wrist weight on RUE and 2 lbs wrist weight on LUE 2 sets of 10 reps , min v.c/ facilitation for proper positioning Seated mid range shoulder flexion with ball min facilitation for proper positioning x 15 reps Wall pushups 2 sets of 10 reps, min facilitation/ v.c,  rolling ball up vertical surface with bilateral UE's individually, mod facilitation Rolling ball up vertical surface with right and left UE AA/ROM, mod facilitation for proper postioning Prone on elbows lifting chest x 10 for scapular stability Arm bike x 5 mins level 4 for conditioning  OT Short Term Goals - 05/28/17 1100      OT SHORT TERM GOAL #7   Title I with updated HEP- due 05/29/17   Time 4   Period Weeks   Status Achieved     OT SHORT TERM GOAL #8   Title Pt will demonstrate RUE shoulder flexion to retrieve a lightweight object at 60 with only minimal compensations/ winging.   Time 4   Period Weeks   Status On-going     OT SHORT TERM GOAL  #9   TITLE Pt will demonstrate ability to retrieve a lightweight object at 110 shoulder flexion with LUE without reports of pain.   Time 4   Period Weeks   Status On-going           OT Long Term Goals - 06/09/17 1313      OT LONG TERM GOAL #1   Status --  Pt reports performing every other day     OT LONG TERM GOAL #4   Title Patient will utilize bilateral upper  extremities to blow dry and style her hair with modified independence   Time 8   Period Weeks   Status Achieved  requires assistance for the back of her hair from husband     OT LONG TERM GOAL #6   Title Patient will demonstrate sufficient strength for unilateral reach to retrieve a 3 lb object from overhead shelf while standing with right hand and left hand (not bilateral reach)   Time 8   Period Weeks   Status Partially Met  able to retrieve however pt demonstrates mod compensations with RUE     OT LONG TERM GOAL #7   Title Patient will demonstrate sufficient strength to get down to floor to clean tub, and get back up without assistance- LTG's due 06/27/17   Time 8   Period Weeks   Status On-going  partially met     OT LONG TERM GOAL #8   Title Pt will demonstrate ability to retrieve a 3 lbs weight from overhead shelf with RUE with no more than min compensations.   Baseline mod compensations retrieveing 3lbs weight from overhead shelf with RUE   Time 8   Period Weeks   Status On-going     OT LONG TERM GOAL  #9   Baseline Pt will resume performance of mod complex cooking and home management tasks at a modified independent level.   Time 8   Period Weeks   Status On-going               Plan - 06/09/17 1310    Clinical Impression Statement Pt is progressing towards goals with improving bilateral strength and functional use.   Rehab Potential Good   OT Frequency 2x / week   OT Duration 8 weeks   OT Treatment/Interventions Self-care/ADL training;Electrical Stimulation;Therapeutic exercise;Neuromuscular education;Energy conservation;Manual Therapy;Functional Mobility Training;DME and/or AE instruction;Visual/perceptual remediation/compensation;Cognitive remediation/compensation;Therapeutic activities;Balance training;Patient/family education;Therapeutic exercises;Passive range of motion   Plan continue UE strengthening, mid-high reach   OT Home Exercise Plan Added to HEP -  shoulder strengthening   Consulted and Agree with Plan of Care Patient;Family member/caregiver      Patient will benefit from skilled therapeutic intervention in order to improve the following deficits and impairments:  Decreased coordination, Decreased range of motion, Decreased endurance, Decreased safety awareness, Decreased balance, Decreased cognition, Decreased strength, Impaired perceived functional ability, Impaired vision/preception, Impaired UE functional use, Impaired tone, Pain, Impaired sensation, Improper body mechanics, Decreased activity tolerance  Visit  Diagnosis: Muscle weakness (generalized)  Other lack of coordination  Other disturbances of skin sensation  Unsteadiness on feet    Problem List Patient Active Problem List   Diagnosis Date Noted  . Bilateral rotator cuff syndrome 04/22/2017  . Acute blood loss anemia   . Physical deconditioning   . Tracheostomy status ([place])   . Hypoxemia   . Dysphagia   . Numbness and tingling   . S/P percutaneous endoscopic gastrostomy (PEG) tube placement ([place])   . Bradycardia   . Left corneal abrasion   . Abnormal chest x-ray   . Acute respiratory failure with hypoxia ([place])   . Atrial fibrillation with RVR ([place])   . Dysphagia, pharyngoesophageal phase   . History of ETT   . Encounter for central line placement   . Encounter for feeding tube placement   . Weakness of right lower extremity   . SOB (shortness of breath)   . Pneumonia   . Pressure injury of skin 01/16/2017  . Acute respiratory failure ([place])   . Hyponatremia with decreased serum osmolality   . Hyponatremia   . Quadriplegia and quadriparesis ([place]) 01/14/2017  . GBS (Guillain Barre syndrome) ([place]) 01/13/2017  . Weakness 01/12/2017  . Hypokalemia 01/12/2017  . Endometriosis 12/05/2013  . Chronic low back pain 08/19/2012  . HYPERLIPIDEMIA 12/10/2010  . COLONIC POLYPS, HX OF 12/13/2009  . HYPERGLYCEMIA, FASTING 11/22/2008  . Depression 02/07/2008  .  ANOSMIA 02/07/2008  . HEADACHE, CHRONIC 02/07/2008  . MITRAL VALVE PROLAPSE 10/05/2007  . OSTEOPENIA 10/05/2007    , 06/09/2017, 1:14 PM Theone Murdoch, OTR/L [phone] Phone: [phone] 5:16 PM 06/09/17 [facility] [address] [place], [place], [address] Phone: [phone]   Fax:  [phone]  Name: Triston Lisanti MRN: [id number] Date of Birth: 1951/12/03

## 2017-06-11 ENCOUNTER — Ambulatory Visit: Payer: BLUE CROSS/BLUE SHIELD | Admitting: Occupational Therapy

## 2017-06-11 DIAGNOSIS — M6281 Muscle weakness (generalized): Secondary | ICD-10-CM

## 2017-06-11 DIAGNOSIS — R278 Other lack of coordination: Secondary | ICD-10-CM

## 2017-06-11 DIAGNOSIS — R208 Other disturbances of skin sensation: Secondary | ICD-10-CM

## 2017-06-11 NOTE — Therapy (Signed)
[facility] [address] [place], [place], [address] Phone: [phone]   Fax:  [phone]  Occupational Therapy Treatment  Patient Details  Name: Joanne Morrison MRN: [id number] Date of Birth: 13-Aug-1951 Referring Provider: Dr Naaman Plummer  Encounter Date: 06/11/2017      OT End of Session - 06/11/17 0937    Visit Number 32   Number of Visits 37   Date for OT Re-Evaluation 07/29/17   Authorization Type BCBS  no visit limit   Authorization - Visit Number 23   OT Start Time 0932   OT Stop Time 1015   OT Time Calculation (min) 43 min   Activity Tolerance Patient tolerated treatment well   Behavior During Therapy [facility] for tasks assessed/performed      Past Medical History:  Diagnosis Date  . Anxiety   . Arthritis    back  . Chronic cystitis   . Chronic low back pain   . Depression   . Diverticulosis of colon   . Hemorrhoids   . History of colon polyps    09-24-2009  rectal hyperplastic polyp/   and 2016 non-cancerous  . History of endometriosis   . History of panic attacks   . Hyperlipidemia   . Osteopenia   . Pelvic pain   . Sensation of pressure in bladder area   . Wears glasses     Past Surgical History:  Procedure Laterality Date  . BUNIONECTOMY Left 2004 approx  . CARPAL TUNNEL RELEASE Bilateral right 2014;  left 2007  . COLONOSCOPY W/ POLYPECTOMY  09/24/2009  . CYSTO WITH HYDRODISTENSION [name] 10/15/2016   Procedure: CYSTOSCOPY/HYDRODISTENSION AND MARCAINE INSTILLATION;  Surgeon: Rana Snare, MD;  Location: [facility];  Service: Urology;  Laterality: [name];  . CYSTO/  HYDRODISTENTION/  INSTILLATION THERAPY  1997 approx  . HYSTEROSCOPY W/D&C  11/01/2004   POLYPECTOMY  . IR GASTROSTOMY TUBE REMOVAL  03/20/2017  . IR GENERIC HISTORICAL  01/26/2017   IR GASTROSTOMY TUBE MOD SED 01/26/2017 Sandi Mariscal, MD [facility]  . LAPAROSCOPIC VAGINAL HYSTERECTOMY WITH SALPINGO OOPHORECTOMY Bilateral  04/20/2012  . TUBAL LIGATION  yrs ago    There were no vitals filed for this visit.      Subjective Assessment - 06/11/17 0935    Pertinent History hx of back pain (went to pain management center)   Currently in Pain? No/denies                 supine PNF patterns with 4 lbs wrist weight on RUE and 2 lbs wrist weight on LUE 2 sets of 10 reps , min v.c/ facilitation for proper positioning Seated mid range shoulder flexion with ball min facilitation for proper positioning x 15 reps Wall pushups 2 sets of 10 reps, min facilitation/ v.c,  rolling ball up vertical surface with bilateral UE's individually, mod facilitation Rolling ball up vertical surface with right and left UE AA/ROM, mod facilitation for proper postioning Prone on elbows lifting chest x 10 for scapular stability Arm bike x 5 mins level 4 for conditioning Low - mid range functional reaching with bilateral UE's open chain min v.c. For postioning                  OT Short Term Goals - 05/28/17 1100      OT SHORT TERM GOAL #7   Title I with updated HEP- due 05/29/17   Time 4   Period Weeks   Status Achieved  OT SHORT TERM GOAL #8   Title Pt will demonstrate RUE shoulder flexion to retrieve a lightweight object at 60 with only minimal compensations/ winging.   Time 4   Period Weeks   Status On-going     OT SHORT TERM GOAL  #9   TITLE Pt will demonstrate ability to retrieve a lightweight object at 110 shoulder flexion with LUE without reports of pain.   Time 4   Period Weeks   Status On-going           OT Long Term Goals - 06/09/17 1313      OT LONG TERM GOAL #1   Status --  Pt reports performing every other day     OT LONG TERM GOAL #4   Title Patient will utilize bilateral upper extremities to blow dry and style her hair with modified independence   Time 8   Period Weeks   Status Achieved  requires assistance for the back of her hair from husband     OT LONG TERM GOAL #6    Title Patient will demonstrate sufficient strength for unilateral reach to retrieve a 3 lb object from overhead shelf while standing with right hand and left hand (not bilateral reach)   Time 8   Period Weeks   Status Partially Met  able to retrieve however pt demonstrates mod compensations with RUE     OT LONG TERM GOAL #7   Title Patient will demonstrate sufficient strength to get down to floor to clean tub, and get back up without assistance- LTG's due 06/27/17   Time 8   Period Weeks   Status On-going  partially met     OT LONG TERM GOAL #8   Title Pt will demonstrate ability to retrieve a 3 lbs weight from overhead shelf with RUE with no more than min compensations.   Baseline mod compensations retrieveing 3lbs weight from overhead shelf with RUE   Time 8   Period Weeks   Status On-going     OT LONG TERM GOAL  #9   Baseline Pt will resume performance of mod complex cooking and home management tasks at a modified independent level.   Time 8   Period Weeks   Status On-going               Plan - 06/11/17 3614    Clinical Impression Statement Pt  is progressing towards goals with improving functional use of bilateral UE's   Rehab Potential Good   OT Frequency 2x / week   OT Duration 8 weeks   OT Treatment/Interventions Self-care/ADL training;Electrical Stimulation;Therapeutic exercise;Neuromuscular education;Energy conservation;Manual Therapy;Functional Mobility Training;DME and/or AE instruction;Visual/perceptual remediation/compensation;Cognitive remediation/compensation;Therapeutic activities;Balance training;Patient/family education;Therapeutic exercises;Passive range of motion   Plan continue bilateral UE functional reach , neuro re-ed for scapular, core stability   OT Home Exercise Plan Added to HEP - shoulder strengthening   Consulted and Agree with Plan of Care Patient;Family member/caregiver      Patient will benefit from skilled therapeutic intervention in  order to improve the following deficits and impairments:  Decreased coordination, Decreased range of motion, Decreased endurance, Decreased safety awareness, Decreased balance, Decreased cognition, Decreased strength, Impaired perceived functional ability, Impaired vision/preception, Impaired UE functional use, Impaired tone, Pain, Impaired sensation, Improper body mechanics, Decreased activity tolerance  Visit Diagnosis: Muscle weakness (generalized)  Other lack of coordination  Other disturbances of skin sensation    Problem List Patient Active Problem List   Diagnosis Date Noted  . Bilateral rotator cuff  syndrome 04/22/2017  . Acute blood loss anemia   . Physical deconditioning   . Tracheostomy status ([place])   . Hypoxemia   . Dysphagia   . Numbness and tingling   . S/P percutaneous endoscopic gastrostomy (PEG) tube placement ([place])   . Bradycardia   . Left corneal abrasion   . Abnormal chest x-ray   . Acute respiratory failure with hypoxia ([place])   . Atrial fibrillation with RVR ([place])   . Dysphagia, pharyngoesophageal phase   . History of ETT   . Encounter for central line placement   . Encounter for feeding tube placement   . Weakness of right lower extremity   . SOB (shortness of breath)   . Pneumonia   . Pressure injury of skin 01/16/2017  . Acute respiratory failure ([place])   . Hyponatremia with decreased serum osmolality   . Hyponatremia   . Quadriplegia and quadriparesis ([place]) 01/14/2017  . GBS (Guillain Barre syndrome) ([place]) 01/13/2017  . Weakness 01/12/2017  . Hypokalemia 01/12/2017  . Endometriosis 12/05/2013  . Chronic low back pain 08/19/2012  . HYPERLIPIDEMIA 12/10/2010  . COLONIC POLYPS, HX OF 12/13/2009  . HYPERGLYCEMIA, FASTING 11/22/2008  . Depression 02/07/2008  . ANOSMIA 02/07/2008  . HEADACHE, CHRONIC 02/07/2008  . MITRAL VALVE PROLAPSE 10/05/2007  . OSTEOPENIA 10/05/2007    RINE,KATHRYN 06/12/2017, 12:57 PM  [facility] [address] [place], [place], [address] Phone: [phone]   Fax:  [phone]  Name: Joanne Morrison MRN: [id number] Date of Birth: 05/22/1951

## 2017-06-16 ENCOUNTER — Ambulatory Visit: Payer: BLUE CROSS/BLUE SHIELD | Attending: Physical Medicine & Rehabilitation | Admitting: Occupational Therapy

## 2017-06-16 DIAGNOSIS — M25512 Pain in left shoulder: Secondary | ICD-10-CM | POA: Insufficient documentation

## 2017-06-16 DIAGNOSIS — M6281 Muscle weakness (generalized): Secondary | ICD-10-CM | POA: Diagnosis not present

## 2017-06-16 DIAGNOSIS — R208 Other disturbances of skin sensation: Secondary | ICD-10-CM | POA: Insufficient documentation

## 2017-06-16 DIAGNOSIS — R2681 Unsteadiness on feet: Secondary | ICD-10-CM | POA: Diagnosis present

## 2017-06-16 DIAGNOSIS — R278 Other lack of coordination: Secondary | ICD-10-CM | POA: Insufficient documentation

## 2017-06-16 NOTE — Therapy (Signed)
[facility] [address] [place], [place], [address] Phone: [phone]   Fax:  [phone]  Occupational Therapy Treatment  Patient Details  Name: Joanne Morrison MRN: [id number] Date of Birth: July 26, 1951 Referring Provider: Dr Naaman Plummer  Encounter Date: 06/16/2017      OT End of Session - 06/16/17 1651    Visit Number 33   Number of Visits 37   Date for OT Re-Evaluation 07/29/17   Authorization - Visit Number 57   OT Start Time 0933   OT Stop Time 1015   OT Time Calculation (min) 42 min   Activity Tolerance Patient tolerated treatment well   Behavior During Therapy [facility] for tasks assessed/performed      Past Medical History:  Diagnosis Date  . Anxiety   . Arthritis    back  . Chronic cystitis   . Chronic low back pain   . Depression   . Diverticulosis of colon   . Hemorrhoids   . History of colon polyps    09-24-2009  rectal hyperplastic polyp/   and 2016 non-cancerous  . History of endometriosis   . History of panic attacks   . Hyperlipidemia   . Osteopenia   . Pelvic pain   . Sensation of pressure in bladder area   . Wears glasses     Past Surgical History:  Procedure Laterality Date  . BUNIONECTOMY Left 2004 approx  . CARPAL TUNNEL RELEASE Bilateral right 2014;  left 2007  . COLONOSCOPY W/ POLYPECTOMY  09/24/2009  . CYSTO WITH HYDRODISTENSION [name] 10/15/2016   Procedure: CYSTOSCOPY/HYDRODISTENSION AND MARCAINE INSTILLATION;  Surgeon: Rana Snare, MD;  Location: [facility];  Service: Urology;  Laterality: [name];  . CYSTO/  HYDRODISTENTION/  INSTILLATION THERAPY  1997 approx  . HYSTEROSCOPY W/D&C  11/01/2004   POLYPECTOMY  . IR GASTROSTOMY TUBE REMOVAL  03/20/2017  . IR GENERIC HISTORICAL  01/26/2017   IR GASTROSTOMY TUBE MOD SED 01/26/2017 Sandi Mariscal, MD [facility]  . LAPAROSCOPIC VAGINAL HYSTERECTOMY WITH SALPINGO OOPHORECTOMY Bilateral 04/20/2012  . TUBAL LIGATION  yrs ago     There were no vitals filed for this visit.      Subjective Assessment - 06/16/17 1644    Subjective  Pt reports only minimal pain   Pertinent History hx of back pain (went to pain management center)   Currently in Pain? Yes   Pain Score 3    Pain Location Arm   Pain Orientation Left   Pain Descriptors / Indicators Aching   Pain Type Chronic pain   Pain Onset In the past 7 days   Pain Frequency Intermittent   Aggravating Factors  over working it   Pain Relieving Factors rest   Multiple Pain Sites No             Treatment: Supine holding 2 kg ball(4.4 lbs) to perform chest press and shoulder flexion, x 10 reps each. Seated mid range shoulder flexion with ball min facilitation for proper positioning x 15 reps Wall pushups 2 sets of 10 reps, min facilitation/ v.c,  rolling ball up vertical surface with bilateral UE's individually, mod facilitation Prone on elbows lifting chest x 10 for scapular stability Arm bike x 6 mins level 5 for conditioning Low - mid range functional reaching with bilateral RUE  open chain min v.c. For postioning while seated on medium ball, with trunk rotation, min facilitation Standing to perform low range functional reach with LUE, min v.c facilitation.  OT Short Term Goals - 05/28/17 1100      OT SHORT TERM GOAL #7   Title I with updated HEP- due 05/29/17   Time 4   Period Weeks   Status Achieved     OT SHORT TERM GOAL #8   Title Pt will demonstrate RUE shoulder flexion to retrieve a lightweight object at 60 with only minimal compensations/ winging.   Time 4   Period Weeks   Status On-going     OT SHORT TERM GOAL  #9   TITLE Pt will demonstrate ability to retrieve a lightweight object at 110 shoulder flexion with LUE without reports of pain.   Time 4   Period Weeks   Status On-going           OT Long Term Goals - 06/09/17 1313      OT LONG TERM GOAL #1   Status --  Pt  reports performing every other day     OT LONG TERM GOAL #4   Title Patient will utilize bilateral upper extremities to blow dry and style her hair with modified independence   Time 8   Period Weeks   Status Achieved  requires assistance for the back of her hair from husband     OT LONG TERM GOAL #6   Title Patient will demonstrate sufficient strength for unilateral reach to retrieve a 3 lb object from overhead shelf while standing with right hand and left hand (not bilateral reach)   Time 8   Period Weeks   Status Partially Met  able to retrieve however pt demonstrates mod compensations with RUE     OT LONG TERM GOAL #7   Title Patient will demonstrate sufficient strength to get down to floor to clean tub, and get back up without assistance- LTG's due 06/27/17   Time 8   Period Weeks   Status On-going  partially met     OT LONG TERM GOAL #8   Title Pt will demonstrate ability to retrieve a 3 lbs weight from overhead shelf with RUE with no more than min compensations.   Baseline mod compensations retrieveing 3lbs weight from overhead shelf with RUE   Time 8   Period Weeks   Status On-going     OT LONG TERM GOAL  #9   Baseline Pt will resume performance of mod complex cooking and home management tasks at a modified independent level.   Time 8   Period Weeks   Status On-going               Plan - 06/16/17 1651    Clinical Impression Statement Pt is progressing towards goals. She demonstrates improved bilateral UE strength and functional use.   Rehab Potential Good   OT Frequency 2x / week   OT Duration 8 weeks   OT Treatment/Interventions Self-care/ADL training;Electrical Stimulation;Therapeutic exercise;Neuromuscular education;Energy conservation;Manual Therapy;Functional Mobility Training;DME and/or AE instruction;Visual/perceptual remediation/compensation;Cognitive remediation/compensation;Therapeutic activities;Balance training;Patient/family education;Therapeutic  exercises;Passive range of motion   Plan neuro re-ed bilateral UE's, scapula, core   OT Home Exercise Plan Added to HEP - shoulder strengthening   Consulted and Agree with Plan of Care Patient;Family member/caregiver      Patient will benefit from skilled therapeutic intervention in order to improve the following deficits and impairments:  Decreased coordination, Decreased range of motion, Decreased endurance, Decreased safety awareness, Decreased balance, Decreased cognition, Decreased strength, Impaired perceived functional ability, Impaired vision/preception, Impaired UE functional use, Impaired tone, Pain, Impaired sensation, Improper body mechanics, Decreased activity  tolerance  Visit Diagnosis: Muscle weakness (generalized)  Other lack of coordination  Other disturbances of skin sensation    Problem List Patient Active Problem List   Diagnosis Date Noted  . Bilateral rotator cuff syndrome 04/22/2017  . Acute blood loss anemia   . Physical deconditioning   . Tracheostomy status ([place])   . Hypoxemia   . Dysphagia   . Numbness and tingling   . S/P percutaneous endoscopic gastrostomy (PEG) tube placement ([place])   . Bradycardia   . Left corneal abrasion   . Abnormal chest x-ray   . Acute respiratory failure with hypoxia ([place])   . Atrial fibrillation with RVR ([place])   . Dysphagia, pharyngoesophageal phase   . History of ETT   . Encounter for central line placement   . Encounter for feeding tube placement   . Weakness of right lower extremity   . SOB (shortness of breath)   . Pneumonia   . Pressure injury of skin 01/16/2017  . Acute respiratory failure ([place])   . Hyponatremia with decreased serum osmolality   . Hyponatremia   . Quadriplegia and quadriparesis ([place]) 01/14/2017  . GBS (Guillain Barre syndrome) ([place]) 01/13/2017  . Weakness 01/12/2017  . Hypokalemia 01/12/2017  . Endometriosis 12/05/2013  . Chronic low back pain 08/19/2012  . HYPERLIPIDEMIA 12/10/2010  .  COLONIC POLYPS, HX OF 12/13/2009  . HYPERGLYCEMIA, FASTING 11/22/2008  . Depression 02/07/2008  . ANOSMIA 02/07/2008  . HEADACHE, CHRONIC 02/07/2008  . MITRAL VALVE PROLAPSE 10/05/2007  . OSTEOPENIA 10/05/2007    RINE,KATHRYN 06/16/2017, 5:00 PM  [facility] [address] [place], [place], [address] Phone: [phone]   Fax:  [phone]  Name: Joanne Morrison MRN: [id number] Date of Birth: [birth year]

## 2017-06-18 ENCOUNTER — Ambulatory Visit: Payer: BLUE CROSS/BLUE SHIELD | Admitting: Occupational Therapy

## 2017-06-18 DIAGNOSIS — M6281 Muscle weakness (generalized): Secondary | ICD-10-CM

## 2017-06-18 DIAGNOSIS — R278 Other lack of coordination: Secondary | ICD-10-CM

## 2017-06-18 DIAGNOSIS — R208 Other disturbances of skin sensation: Secondary | ICD-10-CM

## 2017-06-18 NOTE — Therapy (Signed)
[facility] [address] [place], [place], [address] Phone: [phone]   Fax:  [phone]  Occupational Therapy Treatment  Patient Details  Name: Joanne Morrison MRN: [id number] Date of Birth: [birth year] Referring Provider: Dr Naaman Plummer  Encounter Date: 06/18/2017      OT End of Session - 06/18/17 1115    Visit Number 34   Number of Visits 37   Date for OT Re-Evaluation 07/29/17   Authorization Type [employer]  no visit limit   Authorization - Visit Number 9   OT Start Time 1018   OT Stop Time 1100   OT Time Calculation (min) 42 min   Activity Tolerance Patient tolerated treatment well   Behavior During Therapy [facility] for tasks assessed/performed      Past Medical History:  Diagnosis Date  . Anxiety   . Arthritis    back  . Chronic cystitis   . Chronic low back pain   . Depression   . Diverticulosis of colon   . Hemorrhoids   . History of colon polyps    09-24-2009  rectal hyperplastic polyp/   and 2016 non-cancerous  . History of endometriosis   . History of panic attacks   . Hyperlipidemia   . Osteopenia   . Pelvic pain   . Sensation of pressure in bladder area   . Wears glasses     Past Surgical History:  Procedure Laterality Date  . BUNIONECTOMY Left 2004 approx  . CARPAL TUNNEL RELEASE Bilateral right 2014;  left 2007  . COLONOSCOPY W/ POLYPECTOMY  09/24/2009  . CYSTO WITH HYDRODISTENSION [name] 10/15/2016   Procedure: CYSTOSCOPY/HYDRODISTENSION AND MARCAINE INSTILLATION;  Surgeon: Rana Snare, MD;  Location: [facility];  Service: Urology;  Laterality: [name];  . CYSTO/  HYDRODISTENTION/  INSTILLATION THERAPY  1997 approx  . HYSTEROSCOPY W/D&C  11/01/2004   POLYPECTOMY  . IR GASTROSTOMY TUBE REMOVAL  03/20/2017  . IR GENERIC HISTORICAL  01/26/2017   IR GASTROSTOMY TUBE MOD SED 01/26/2017 Sandi Mariscal, MD [facility]  . LAPAROSCOPIC VAGINAL HYSTERECTOMY WITH SALPINGO OOPHORECTOMY Bilateral  04/20/2012  . TUBAL LIGATION  yrs ago    There were no vitals filed for this visit.      Subjective Assessment - 06/18/17 1114    Pertinent History hx of back pain (went to pain management center)   Currently in Pain? No/denies         Treatment:supine holding 2 kg ball(4.4 lbs) to perform chest press and shoulder flexion, x 10 reps each. Supine shoulder circles 10 reps each direction with 1 lbs weight followed by shoulder flexion with 1 lbs weight for each UE individually. Hemiglide with bilateral UE's for low range shoulder flexion x 10 reps Arm bike x 6 mins level 5 for conditioning Low - mid range functional reaching with RUE to place graded clothes pins on vertical antennae min v.c.Mid range reaching with LUE to place itemss on low shelf, min v.c/ faciliation.                       OT Short Term Goals - 05/28/17 1100      OT SHORT TERM GOAL #7   Title I with updated HEP- due 05/29/17   Time 4   Period Weeks   Status Achieved     OT SHORT TERM GOAL #8   Title Pt will demonstrate RUE shoulder flexion to retrieve a lightweight object at 60 with only minimal compensations/ winging.  Time 4   Period Weeks   Status On-going     OT SHORT TERM GOAL  #9   TITLE Pt will demonstrate ability to retrieve a lightweight object at 110 shoulder flexion with LUE without reports of pain.   Time 4   Period Weeks   Status On-going           OT Long Term Goals - 06/09/17 1313      OT LONG TERM GOAL #1   Status --  Pt reports performing every other day     OT LONG TERM GOAL #4   Title Patient will utilize bilateral upper extremities to blow dry and style her hair with modified independence   Time 8   Period Weeks   Status Achieved  requires assistance for the back of her hair from husband     OT LONG TERM GOAL #6   Title Patient will demonstrate sufficient strength for unilateral reach to retrieve a 3 lb object from overhead shelf while standing with  right hand and left hand (not bilateral reach)   Time 8   Period Weeks   Status Partially Met  able to retrieve however pt demonstrates mod compensations with RUE     OT LONG TERM GOAL #7   Title Patient will demonstrate sufficient strength to get down to floor to clean tub, and get back up without assistance- LTG's due 06/27/17   Time 8   Period Weeks   Status On-going  partially met     OT LONG TERM GOAL #8   Title Pt will demonstrate ability to retrieve a 3 lbs weight from overhead shelf with RUE with no more than min compensations.   Baseline mod compensations retrieveing 3lbs weight from overhead shelf with RUE   Time 8   Period Weeks   Status On-going     OT LONG TERM GOAL  #9   Baseline Pt will resume performance of mod complex cooking and home management tasks at a modified independent level.   Time 8   Period Weeks   Status On-going               Plan - 06/18/17 1115    Clinical Impression Statement Pt is progressing towards goals. She demonstrates improved functional reach to a mid level shelf with LUE today with only min difficulty, v.c..   Rehab Potential Good   OT Frequency 2x / week   OT Duration 8 weeks   OT Treatment/Interventions Self-care/ADL training;Electrical Stimulation;Therapeutic exercise;Neuromuscular education;Energy conservation;Manual Therapy;Functional Mobility Training;DME and/or AE instruction;Visual/perceptual remediation/compensation;Cognitive remediation/compensation;Therapeutic activities;Balance training;Patient/family education;Therapeutic exercises;Passive range of motion   Plan neuro re-ed bilateral UE's, scapula and core strength   Consulted and Agree with Plan of Care Patient;Family member/caregiver      Patient will benefit from skilled therapeutic intervention in order to improve the following deficits and impairments:  Decreased coordination, Decreased range of motion, Decreased endurance, Decreased safety awareness, Decreased  balance, Decreased cognition, Decreased strength, Impaired perceived functional ability, Impaired vision/preception, Impaired UE functional use, Impaired tone, Pain, Impaired sensation, Improper body mechanics, Decreased activity tolerance  Visit Diagnosis: Muscle weakness (generalized)  Other lack of coordination  Other disturbances of skin sensation    Problem List Patient Active Problem List   Diagnosis Date Noted  . Bilateral rotator cuff syndrome 04/22/2017  . Acute blood loss anemia   . Physical deconditioning   . Tracheostomy status ([place])   . Hypoxemia   . Dysphagia   . Numbness and tingling   .  S/P percutaneous endoscopic gastrostomy (PEG) tube placement ([place])   . Bradycardia   . Left corneal abrasion   . Abnormal chest x-ray   . Acute respiratory failure with hypoxia ([place])   . Atrial fibrillation with RVR ([place])   . Dysphagia, pharyngoesophageal phase   . History of ETT   . Encounter for central line placement   . Encounter for feeding tube placement   . Weakness of right lower extremity   . SOB (shortness of breath)   . Pneumonia   . Pressure injury of skin 01/16/2017  . Acute respiratory failure ([place])   . Hyponatremia with decreased serum osmolality   . Hyponatremia   . Quadriplegia and quadriparesis ([place]) 01/14/2017  . GBS (Guillain Barre syndrome) ([place]) 01/13/2017  . Weakness 01/12/2017  . Hypokalemia 01/12/2017  . Endometriosis 12/05/2013  . Chronic low back pain 08/19/2012  . HYPERLIPIDEMIA 12/10/2010  . COLONIC POLYPS, HX OF 12/13/2009  . HYPERGLYCEMIA, FASTING 11/22/2008  . Depression 02/07/2008  . ANOSMIA 02/07/2008  . HEADACHE, CHRONIC 02/07/2008  . MITRAL VALVE PROLAPSE 10/05/2007  . OSTEOPENIA 10/05/2007    Jamonte Curfman 06/18/2017, 11:17 AM Theone Murdoch, OTR/L [phone] Phone: [phone] 11:36 AM 07/05/18[facility] [address] [place], [place], [address] Phone:  [phone]   Fax:  [phone]  Name: Semaj Kham MRN: [id number] Date of Birth: 09/16/1951

## 2017-06-23 ENCOUNTER — Encounter: Payer: Self-pay | Admitting: Physical Medicine & Rehabilitation

## 2017-06-23 ENCOUNTER — Encounter: Payer: BLUE CROSS/BLUE SHIELD | Admitting: Occupational Therapy

## 2017-06-23 ENCOUNTER — Encounter
Payer: BLUE CROSS/BLUE SHIELD | Attending: Physical Medicine & Rehabilitation | Admitting: Physical Medicine & Rehabilitation

## 2017-06-23 VITALS — BP 154/92 | HR 68

## 2017-06-23 DIAGNOSIS — H02206 Unspecified lagophthalmos left eye, unspecified eyelid: Secondary | ICD-10-CM | POA: Insufficient documentation

## 2017-06-23 DIAGNOSIS — M545 Low back pain: Secondary | ICD-10-CM | POA: Insufficient documentation

## 2017-06-23 DIAGNOSIS — F419 Anxiety disorder, unspecified: Secondary | ICD-10-CM | POA: Diagnosis not present

## 2017-06-23 DIAGNOSIS — Z79899 Other long term (current) drug therapy: Secondary | ICD-10-CM | POA: Insufficient documentation

## 2017-06-23 DIAGNOSIS — M75102 Unspecified rotator cuff tear or rupture of left shoulder, not specified as traumatic: Secondary | ICD-10-CM

## 2017-06-23 DIAGNOSIS — M8580 Other specified disorders of bone density and structure, unspecified site: Secondary | ICD-10-CM | POA: Insufficient documentation

## 2017-06-23 DIAGNOSIS — Z931 Gastrostomy status: Secondary | ICD-10-CM | POA: Diagnosis not present

## 2017-06-23 DIAGNOSIS — Z8249 Family history of ischemic heart disease and other diseases of the circulatory system: Secondary | ICD-10-CM | POA: Diagnosis not present

## 2017-06-23 DIAGNOSIS — G8929 Other chronic pain: Secondary | ICD-10-CM | POA: Insufficient documentation

## 2017-06-23 DIAGNOSIS — I4891 Unspecified atrial fibrillation: Secondary | ICD-10-CM | POA: Diagnosis not present

## 2017-06-23 DIAGNOSIS — Z8262 Family history of osteoporosis: Secondary | ICD-10-CM | POA: Insufficient documentation

## 2017-06-23 DIAGNOSIS — E785 Hyperlipidemia, unspecified: Secondary | ICD-10-CM | POA: Diagnosis not present

## 2017-06-23 DIAGNOSIS — G610 Guillain-Barre syndrome: Secondary | ICD-10-CM | POA: Insufficient documentation

## 2017-06-23 DIAGNOSIS — M75101 Unspecified rotator cuff tear or rupture of right shoulder, not specified as traumatic: Secondary | ICD-10-CM

## 2017-06-23 DIAGNOSIS — F329 Major depressive disorder, single episode, unspecified: Secondary | ICD-10-CM | POA: Diagnosis not present

## 2017-06-23 DIAGNOSIS — G8250 Quadriplegia, unspecified: Secondary | ICD-10-CM | POA: Insufficient documentation

## 2017-06-23 DIAGNOSIS — R001 Bradycardia, unspecified: Secondary | ICD-10-CM | POA: Insufficient documentation

## 2017-06-23 DIAGNOSIS — L299 Pruritus, unspecified: Secondary | ICD-10-CM | POA: Diagnosis not present

## 2017-06-23 MED ORDER — DICLOFENAC SODIUM 75 MG PO TBEC: 75.0000 mg | DELAYED_RELEASE_TABLET | Freq: Two times a day (BID) | ORAL | 3 refills | Status: DC

## 2017-06-23 NOTE — Patient Instructions (Signed)
RETURN TO DRIVING PLAN:  WITH THE SUPERVISION OF A LICENSED DRIVER, PLEASE DRIVE IN AN EMPTY PARKING LOT FOR AT LEAST 2-3 TRIALS TO TEST REACTION TIME, VISION, USE OF EQUIPMENT IN CAR, ETC.  IF SUCCESSFUL WITH THE PARKING LOT DRIVING, PROCEED TO SUPERVISED DRIVING TRIALS IN YOUR NEIGHBORHOOD STREETS AT LOW TRAFFIC TIMES TO TEST OBSERVATION TO TRAFFIC SIGNALS, REACTION TIME, ETC. PLEASE ATTEMPT AT LEAST 2-3 TRIALS IN YOUR NEIGHBORHOOD.  IF NEIGHBORHOOD DRIVING IS SUCCESSFUL, YOU MAY PROCEED TO DRIVING IN BUSIER AREAS IN YOUR COMMUNITY WITH SUPERVISION OF A LICENSED DRIVER. PLEASE ATTEMPT AT LEAST 4-5 TRIALS.  IF COMMUNITY DRIVING IS SUCCESSFUL, YOU MAY PROCEED TO DRIVING ALONE, DURING THE DAY TIME, IN NON-PEAK TRAFFIC TIMES. YOU SHOULD DRIVE NO FURTHER THAN 20 MINUTES IN ONE DIRECTION. PLEASE DO NOT DRIVE IF YOU FEEL FATIGUED OR UNDER THE INFLUENCE OF MEDICATION.   

## 2017-06-23 NOTE — Progress Notes (Signed)
Subjective:    Patient ID: Joanne Morrison, female    DOB: 1951-01-11, 66 y.o.   MRN: [id number]  HPI   Mrs. Vitiello is here in follow up of her GBS. She continues to make gains. She still is involved in OT at neuro-rehab. Her shoulder pain is better. She really likes the voltaren and has had no side effects.   She is independent at home. She still struggles a bit with overhead activities but is improving. She follows up with optho regularly and has seen some improvement in her left eye. She is still wearing a weight. She tried without and eye became irritated. She has noticed some more "numbness" in the left side of her mouth.   Pain Inventory Average Pain 0 Pain Right Now 0 My pain is dull  In the last 24 hours, has pain interfered with the following? General activity 10 Relation with others 0 Enjoyment of life 5 What TIME of day is your pain at its worst? . Sleep (in general) Fair  Pain is worse with: moving wrong Pain improves with: . Relief from Meds: 9  Mobility walk without assistance ability to climb steps?  yes do you drive?  no  Function retired  Neuro/Psych depression anxiety  Prior Studies Any changes since last visit?  no  Physicians involved in your care eye doctor   Family History  Problem Relation Age of Onset  . Hypertension Mother   . Dementia Mother   . Lung cancer Mother        smoker  . Osteoporosis Mother   . Heart attack Father 59  . Hypertension Brother   . Pulmonary embolism Brother 60       ?etilology  . Diabetes Maternal Aunt   . Cancer Maternal Uncle        RENAL  . Heart attack Maternal Grandmother 86  . Lung cancer Maternal Grandfather        Ecologist  . Anesthesia problems Neg Hx   . Stroke Neg Hx    Social History   Social History  . Marital status: Married    Spouse name: [name] Resides  . Number of children: 1  . Years of education: 7   Social History Main Topics  . Smoking status: Never Smoker  . Smokeless  tobacco: Never Used  . Alcohol use No  . Drug use: No  . Sexual activity: Yes    Birth control/ protection: None   Other Topics Concern  . Not on file   Social History Narrative   Lives w/ husband   Caffeine use: 2 drinks per month    Right handed   Past Surgical History:  Procedure Laterality Date  . BUNIONECTOMY Left 2004 approx  . CARPAL TUNNEL RELEASE Bilateral right 2014;  left 2007  . COLONOSCOPY W/ POLYPECTOMY  09/24/2009  . CYSTO WITH HYDRODISTENSION [name] 10/15/2016   Procedure: CYSTOSCOPY/HYDRODISTENSION AND MARCAINE INSTILLATION;  Surgeon: Rana Snare, MD;  Location: [facility];  Service: Urology;  Laterality: [name];  . CYSTO/  HYDRODISTENTION/  INSTILLATION THERAPY  1997 approx  . HYSTEROSCOPY W/D&C  11/01/2004   POLYPECTOMY  . IR GASTROSTOMY TUBE REMOVAL  03/20/2017  . IR GENERIC HISTORICAL  01/26/2017   IR GASTROSTOMY TUBE MOD SED 01/26/2017 Sandi Mariscal, MD [facility]  . LAPAROSCOPIC VAGINAL HYSTERECTOMY WITH SALPINGO OOPHORECTOMY Bilateral 04/20/2012  . TUBAL LIGATION  yrs ago   Past Medical History:  Diagnosis Date  . Anxiety   . Arthritis  back  . Chronic cystitis   . Chronic low back pain   . Depression   . Diverticulosis of colon   . Hemorrhoids   . History of colon polyps    09-24-2009  rectal hyperplastic polyp/   and 2016 non-cancerous  . History of endometriosis   . History of panic attacks   . Hyperlipidemia   . Osteopenia   . Pelvic pain   . Sensation of pressure in bladder area   . Wears glasses    There were no vitals taken for this visit.  Opioid Risk Score:   Fall Risk Score:  `1  Depression screen PHQ 2/9  Depression screen [facility] 2/9 03/04/2017 12/05/2013  Decreased Interest 3 0  Down, Depressed, Hopeless 3 0  PHQ - 2 Score 6 0  Altered sleeping 2 -  Tired, decreased energy 3 -  Change in appetite 0 -  Feeling bad or failure about yourself  0 -  Trouble concentrating 3 -  Moving slowly or fidgety/restless 3 -    Suicidal thoughts 0 -  PHQ-9 Score 17 -  Difficult doing work/chores Extremely dIfficult -     Review of Systems  Constitutional:       Hot flashes  HENT: Negative.   Eyes: Negative.   Respiratory: Negative.   Cardiovascular: Negative.   Gastrointestinal: Negative.   Endocrine: Negative.   Genitourinary: Negative.   Musculoskeletal: Negative.   Skin: Negative.   Allergic/Immunologic: Negative.   Neurological: Negative.   Hematological: Negative.   Psychiatric/Behavioral: Negative.   All other systems reviewed and are negative.      Objective:   Physical Exam Constitutional: She appears well-developed and well-nourished. NAD.  HENT: Normocephalic. Atraumatic.  Eyes: Left lid with attached weight. Sclera white.  Neck: trach scar Cardiovascular:  RRR Respiratory: CTA B  GI: Soft. Bowel sounds are normal.  Musculoskeletal: mild IR/ER pain right upper ext. LUE with miimal pain Neurological: She is alert and oriented.  Motor:  LUE: 4+/5 proximally, 4+/5 distally  RUE: 4 to 4++/5. Still a little more weak proximally.  Decreased sensation over toes which is resolving  Mild left facial droop. Speech clear. Lid Closes with weight Normal gait.  Skin: Skin is warm and dry.  Psychiatric: Normal mood and behavior.      Assessment & Plan:  Medical Problem List and Plan: 1. 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  -pt continues to make nice gains. Should be careful to not over do it.  -continue with outpt OT at neuro-rehab  -reassured her that her speech was stable to improved 2. Neuropathic pain/bilateral RTC syndrome             -continue outpt OT and HEP             -continue with voltaren 75mg  bid, want to work towards prn dosing. She's aware.  3. Mood: Zoloft 50 mg daily,  3. Atrial fibrillation/bradycardia. HR controlled 4. 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 Underwent tarorhaphy 02/04/2017. Continue  erythromycin ointment for now follow-up ophthalmology services as needed, -continue with plan per optho. I see progress there although lid still lags 5. Pruritus: resolved   15 minutes of face to face patient care time were spent during this visit. All questions were encouraged and answered.

## 2017-06-25 ENCOUNTER — Ambulatory Visit: Payer: BLUE CROSS/BLUE SHIELD | Admitting: Occupational Therapy

## 2017-06-25 DIAGNOSIS — M6281 Muscle weakness (generalized): Secondary | ICD-10-CM

## 2017-06-25 DIAGNOSIS — R278 Other lack of coordination: Secondary | ICD-10-CM

## 2017-06-25 DIAGNOSIS — R208 Other disturbances of skin sensation: Secondary | ICD-10-CM

## 2017-06-25 NOTE — Therapy (Signed)
[facility] [address] [place], [place], [place], [address] Phone: [phone]   Fax:  [phone]  Occupational Therapy Treatment  Patient Details  Name: Joanne Morrison MRN: [id number] Date of Birth: [birth year] Referring Provider: Dr Naaman Plummer  Encounter Date: 06/25/2017      OT End of Session - 06/25/17 1022    Visit Number 35   Number of Visits 37   Date for OT Re-Evaluation 07/29/17   Authorization Type BC[employer]  no visit limit   Authorization - Visit Number 35   OT Start Time [phone]   OT Stop Time 1023   OT Time Calculation (min) 44 min   Activity Tolerance Patient tolerated treatment well   Behavior During Therapy [facility] for tasks assessed/performed      Past Medical History:  Diagnosis Date  . Anxiety   . Arthritis    back  . Chronic cystitis   . Chronic low back pain   . Depression   . Diverticulosis of colon   . Hemorrhoids   . History of colon polyps    09-24-2009  rectal hyperplastic polyp/   and 2016 non-cancerous  . History of endometriosis   . History of panic attacks   . Hyperlipidemia   . Osteopenia   . Pelvic pain   . Sensation of pressure in bladder area   . Wears glasses     Past Surgical History:  Procedure Laterality Date  . BUNIONECTOMY Left 2004 approx  . CARPAL TUNNEL RELEASE Bilateral right 2014;  left 2007  . COLONOSCOPY W/ POLYPECTOMY  09/24/2009  . CYSTO WITH HYDRODISTENSION [name] 10/15/2016   Procedure: CYSTOSCOPY/HYDRODISTENSION AND MARCAINE INSTILLATION;  Surgeon: Rana Snare, MD;  Location: [facility];  Service: Urology;  Laterality: [name];  . CYSTO/  HYDRODISTENTION/  INSTILLATION THERAPY  1997 approx  . HYSTEROSCOPY W/D&C  11/01/2004   POLYPECTOMY  . IR GASTROSTOMY TUBE REMOVAL  03/20/2017  . IR GENERIC HISTORICAL  01/26/2017   IR GASTROSTOMY TUBE MOD SED 01/26/2017 Sandi Mariscal, MD [facility]-INTERV RAD  . LAPAROSCOPIC VAGINAL HYSTERECTOMY WITH SALPINGO OOPHORECTOMY Bilateral  04/20/2012  . TUBAL LIGATION  yrs ago    There were no vitals filed for this visit.      Subjective Assessment - 06/25/17 1022    Subjective  Dr. Naaman Plummer may release me next time   Pertinent History hx of back pain (went to pain management center)   Currently in Pain? No/denies        Supine holding 4.4 lbs ball with BUEs to perform chest press and shoulder flexion, x 10 reps each.  Supine shoulder circles 10 reps each direction with 1 lbs weight followed by shoulder flexion with 1 lbs weight for each UE individually.  In prone, scapular retraction BUEs with shoulders in extension with min cueing, scapular depression with chest lifts, plank position of elbows for incr core/scapular stability--all with min cueing.  In quadruped, cat/cow positions for incr scapular stability with min cueing/facilitation for compensatory patterns.   Arm bike x 6 mins level 5 for conditioning (forward/backward)  Mid range functional reaching with RUE with min cueing/facilitation  Low-Mid range reaching with LUE with min v.c/ faciliation.                           OT Short Term Goals - 05/28/17 1100      OT SHORT TERM GOAL #7   Title I with updated HEP- due 05/29/17   Time  4   Period Weeks   Status Achieved     OT SHORT TERM GOAL #8   Title Pt will demonstrate RUE shoulder flexion to retrieve a lightweight object at 60 with only minimal compensations/ winging.   Time 4   Period Weeks   Status On-going     OT SHORT TERM GOAL  #9   TITLE Pt will demonstrate ability to retrieve a lightweight object at 110 shoulder flexion with LUE without reports of pain.   Time 4   Period Weeks   Status On-going           OT Long Term Goals - 06/09/17 1313      OT LONG TERM GOAL #1   Status --  Pt reports performing every other day     OT LONG TERM GOAL #4   Title Patient will utilize bilateral upper extremities to blow dry and style her hair with modified independence    Time 8   Period Weeks   Status Achieved  requires assistance for the back of her hair from husband     OT LONG TERM GOAL #6   Title Patient will demonstrate sufficient strength for unilateral reach to retrieve a 3 lb object from overhead shelf while standing with right hand and left hand (not bilateral reach)   Time 8   Period Weeks   Status Partially Met  able to retrieve however pt demonstrates mod compensations with RUE     OT LONG TERM GOAL #7   Title Patient will demonstrate sufficient strength to get down to floor to clean tub, and get back up without assistance- LTG's due 06/27/17   Time 8   Period Weeks   Status On-going  partially met     OT LONG TERM GOAL #8   Title Pt will demonstrate ability to retrieve a 3 lbs weight from overhead shelf with RUE with no more than min compensations.   Baseline mod compensations retrieveing 3lbs weight from overhead shelf with RUE   Time 8   Period Weeks   Status On-going     OT LONG TERM GOAL  #9   Baseline Pt will resume performance of mod complex cooking and home management tasks at a modified independent level.   Time 8   Period Weeks   Status On-going               Plan - 06/25/17 1256    Clinical Impression Statement Pt is progressing towards goals with improving functional reaching with decr compensation overall.   Rehab Potential Good   OT Frequency 2x / week   OT Duration 8 weeks   OT Treatment/Interventions Self-care/ADL training;Electrical Stimulation;Therapeutic exercise;Neuromuscular education;Energy conservation;Manual Therapy;Functional Mobility Training;DME and/or AE instruction;Visual/perceptual remediation/compensation;Cognitive remediation/compensation;Therapeutic activities;Balance training;Patient/family education;Therapeutic exercises;Passive range of motion   Plan check LTGs; neuro re-ed BUEs, scapula/core strength, functional reaching   Consulted and Agree with Plan of Care Patient;Family  member/caregiver      Patient will benefit from skilled therapeutic intervention in order to improve the following deficits and impairments:  Decreased coordination, Decreased range of motion, Decreased endurance, Decreased safety awareness, Decreased balance, Decreased cognition, Decreased strength, Impaired perceived functional ability, Impaired vision/preception, Impaired UE functional use, Impaired tone, Pain, Impaired sensation, Improper body mechanics, Decreased activity tolerance  Visit Diagnosis: Muscle weakness (generalized)  Other lack of coordination  Other disturbances of skin sensation    Problem List Patient Active Problem List   Diagnosis Date Noted  . Bilateral rotator cuff syndrome 04/22/2017  .  Acute blood loss anemia   . Physical deconditioning   . Tracheostomy status ([place])   . Hypoxemia   . Dysphagia   . Numbness and tingling   . S/P percutaneous endoscopic gastrostomy (PEG) tube placement ([place])   . Bradycardia   . Left corneal abrasion   . Abnormal chest x-ray   . Acute respiratory failure with hypoxia ([place])   . Atrial fibrillation with RVR ([place])   . Dysphagia, pharyngoesophageal phase   . History of ETT   . Encounter for central line placement   . Encounter for feeding tube placement   . Weakness of right lower extremity   . SOB (shortness of breath)   . Pneumonia   . Pressure injury of skin 01/16/2017  . Acute respiratory failure ([place])   . Hyponatremia with decreased serum osmolality   . Hyponatremia   . Quadriplegia and quadriparesis ([place]) 01/14/2017  . GBS (Guillain Barre syndrome) ([place]) 01/13/2017  . Weakness 01/12/2017  . Hypokalemia 01/12/2017  . Endometriosis 12/05/2013  . Chronic low back pain 08/19/2012  . HYPERLIPIDEMIA 12/10/2010  . COLONIC POLYPS, HX OF 12/13/2009  . HYPERGLYCEMIA, FASTING 11/22/2008  . Depression 02/07/2008  . ANOSMIA 02/07/2008  . HEADACHE, CHRONIC 02/07/2008  . MITRAL VALVE PROLAPSE 10/05/2007  . OSTEOPENIA  10/05/2007    [facility] 06/25/2017, 5:06 PM  [facility] [address] [place], [place], [address] Phone: [phone]   Fax:  [phone]  Name: Joanne Morrison  MRN: [id number] Date of Birth: Feb 17, 1951   Vianne Bulls, OTR/L [facility] [address]. [place], [place]  [address] [phone] phone [phone] 06/25/17 5:06 PM

## 2017-06-30 ENCOUNTER — Ambulatory Visit: Payer: BLUE CROSS/BLUE SHIELD | Admitting: Occupational Therapy

## 2017-06-30 DIAGNOSIS — R208 Other disturbances of skin sensation: Secondary | ICD-10-CM

## 2017-06-30 DIAGNOSIS — R278 Other lack of coordination: Secondary | ICD-10-CM

## 2017-06-30 DIAGNOSIS — R2681 Unsteadiness on feet: Secondary | ICD-10-CM

## 2017-06-30 DIAGNOSIS — M6281 Muscle weakness (generalized): Secondary | ICD-10-CM | POA: Diagnosis not present

## 2017-07-01 NOTE — Therapy (Signed)
[facility] [address] [place], [place], [address] Phone: [phone]   Fax:  [phone]  Occupational Therapy Treatment  Patient Details  Name: Joanne Morrison MRN: [id number] Date of Birth: [birth year] Referring Provider: Dr Naaman Plummer  Encounter Date: 06/30/2017      OT End of Session - 07/01/17 1145    Visit Number 36   Number of Visits 37   Authorization Type BCBS  no visit limit   Authorization - Visit Number 33   OT Start Time 1150   OT Stop Time 1233   OT Time Calculation (min) 43 min   Activity Tolerance Patient tolerated treatment well   Behavior During Therapy [facility] for tasks assessed/performed      Past Medical History:  Diagnosis Date  . Anxiety   . Arthritis    back  . Chronic cystitis   . Chronic low back pain   . Depression   . Diverticulosis of colon   . Hemorrhoids   . History of colon polyps    09-24-2009  rectal hyperplastic polyp/   and 2016 non-cancerous  . History of endometriosis   . History of panic attacks   . Hyperlipidemia   . Osteopenia   . Pelvic pain   . Sensation of pressure in bladder area   . Wears glasses     Past Surgical History:  Procedure Laterality Date  . BUNIONECTOMY Left 2004 approx  . CARPAL TUNNEL RELEASE Bilateral right 2014;  left 2007  . COLONOSCOPY W/ POLYPECTOMY  09/24/2009  . CYSTO WITH HYDRODISTENSION [name] 10/15/2016   Procedure: CYSTOSCOPY/HYDRODISTENSION AND MARCAINE INSTILLATION;  Surgeon: Rana Snare, MD;  Location: [facility];  Service: Urology;  Laterality: [name];  . CYSTO/  HYDRODISTENTION/  INSTILLATION THERAPY  1997 approx  . HYSTEROSCOPY W/D&C  11/01/2004   POLYPECTOMY  . IR GASTROSTOMY TUBE REMOVAL  03/20/2017  . IR GENERIC HISTORICAL  01/26/2017   IR GASTROSTOMY TUBE MOD SED 01/26/2017 Sandi Mariscal, MD [facility]INTER[facility]  . LAPAROSCOPIC VAGINAL HYSTERECTOMY WITH SALPINGO OOPHORECTOMY Bilateral 04/20/2012  . TUBAL LIGATION  yrs ago     There were no vitals filed for this visit.      Subjective Assessment - 07/01/17 1142    Subjective  Pt reports she still fatigues quiclkly when washing dishes   Currently in Pain? No/denies        Treatment; checked progress towards goals. Pt wishes to continue therapy, renewal to follow. supine holding 2 kg ball(4.4 lbs) to perform chest press and shoulder flexion, x 10 reps each. Supine shoulder circles 10 reps each direction with 1 lbs weight followed by shoulder flexion with 1 lbs weight for each UE individually. Hemiglide with bilateral UE's for low range shoulder flexion x 10 reps Arm bike x 6 mins level 5 for conditioning Wall push ups 2 sets of 10 reps Pt reports significant fatigued today and pt was noted to have increased RUE scapular winging. Pt was able to retrieve a lightweight item at 110 shoulder flexion with LUE pain free today.  Pt continues to demonstrate moderate scapular winging in RUE with shoulder flexion greater than 90* Pt reports she continues to require up to 4 rest breaks for drying and styling her hair and washing a load of dishes due to fatigue, decreased UE endurance and strength.                        OT Short Term Goals - 06/30/17 1202  OT SHORT TERM GOAL #8   Title Pt will demonstrate RUE shoulder flexion to retrieve a lightweight object at 60 with only minimal compensations/ winging.   Status Achieved  grossly 60*     OT SHORT TERM GOAL  #9   TITLE Pt will demonstrate ability to retrieve a lightweight object at 110 shoulder flexion with LUE without reports of pain.   Status Achieved  met no reports of pain      OT SHORT TERM GOAL  #10   TITLE I with updated HEP- due 08/14/17   Time 6   Period Weeks   Status New     OT SHORT TERM GOAL  #11   TITLE Pt will demonstrate improved bilateral UE strength and functional use as evidenced by drying and styling her hair with no more than 2 rest breaks.   Baseline required  4 rest breaks today   Time 6   Period Weeks   Status New     OT SHORT TERM GOAL  #12   TITLE Pt will demonstrate ability to retrieve a lightweight  object at 75* with RUE with only min compensations/ scapular winging   Baseline mod compensations, mod winging when retrieving items greater than 60*   Time 6   Period Weeks   Status New     OT SHORT TERM GOAL  #13   TITLE Pt will report increased ease with opening containers and plastic bags.   Time 6   Period Weeks   Status New           OT Long Term Goals - 06/30/17 1205      OT LONG TERM GOAL #1   Status --     OT LONG TERM GOAL #4   Title Patient will utilize bilateral upper extremities to blow dry and style her hair with modified independence   Time 8   Period Weeks   Status Achieved  requires assistance for the back of her hair from husband     Long Term Additional Goals   Additional Long Term Goals Yes     OT LONG TERM GOAL #6   Title Patient will demonstrate sufficient strength for unilateral reach to retrieve a 3 lb object from overhead shelf while standing with right hand and left hand (not bilateral reach)   Time 8   Period Weeks   Status Partially Met  able to retrieve however pt demonstrates mod compensations with RUE     OT LONG TERM GOAL #7   Title Patient will demonstrate sufficient strength to get down to floor to clean tub, and get back up without assistance- LTG's due 06/27/17   Time 8   Period Weeks   Status Achieved  partially met     OT LONG TERM GOAL #8   Title Pt will demonstrate ability to retrieve a 3 lbs weight from overhead shelf with RUE with no more than min compensations.- due 09/27/17   Baseline mod compensations retrieving 3lbs weight from overhead shelf with RUE   Time 12   Period Weeks   Status On-going  moderate scapular winging, difficulty     OT LONG TERM GOAL  #9   Baseline Pt will resume performance of mod complex cooking and home management tasks at a modified  independent level.   Time --   Period Weeks   Status Achieved     OT LONG TERM GOAL  #10   TITLE Pt will demonstrate ability to retrieve a lightweight object  at 71* with LUE, with only min compensations, minimal scapular winging in prep for functional use.   Time 12   Period Weeks   Status New     OT LONG TERM GOAL  #11   TITLE Pt will demonstrate increased UE strength and endurance as evidenced by washing a load of dishes, including odd shaped items with no more than 1 rest break.   Baseline 4 rest breaks today   Time 12   Period Weeks   Status New               Plan - 07/01/17 1204    Clinical Impression Statement Pt is progressing towards goals with improving strength and UE functional use. Pt remains limited by decreased UE strength, decreased activity tolerance which impacts pt's ability to perform ADLs/IADLS. Pt can benefit from continued skilled occupational therapy to maximize pt's functional independence with daily activities.   Rehab Potential Good   OT Frequency 2x / week  dependnet upon progress may decrease frequency   OT Duration 12 weeks   OT Treatment/Interventions Self-care/ADL training;Electrical Stimulation;Therapeutic exercise;Neuromuscular education;Energy conservation;Manual Therapy;Functional Mobility Training;DME and/or AE instruction;Visual/perceptual remediation/compensation;Cognitive remediation/compensation;Therapeutic activities;Balance training;Patient/family education;Therapeutic exercises;Passive range of motion   Plan work towards updated goals, UE functional use   Consulted and Agree with Plan of Care Patient;Family member/caregiver   Family Member Consulted husband      Patient will benefit from skilled therapeutic intervention in order to improve the following deficits and impairments:  Decreased coordination, Decreased range of motion, Decreased endurance, Decreased safety awareness, Decreased balance, Decreased cognition, Decreased  strength, Impaired perceived functional ability, Impaired vision/preception, Impaired UE functional use, Impaired tone, Pain, Impaired sensation, Improper body mechanics, Decreased activity tolerance  Visit Diagnosis: Muscle weakness (generalized) - Plan: Ot plan of care cert/re-cert  Other disturbances of skin sensation - Plan: Ot plan of care cert/re-cert  Unsteadiness on feet - Plan: Ot plan of care cert/re-cert  Other lack of coordination - Plan: Ot plan of care cert/re-cert    Problem List Patient Active Problem List   Diagnosis Date Noted  . Bilateral rotator cuff syndrome 04/22/2017  . Acute blood loss anemia   . Physical deconditioning   . Tracheostomy status ([place])   . Hypoxemia   . Dysphagia   . Numbness and tingling   . S/P percutaneous endoscopic gastrostomy (PEG) tube placement ([place])   . Bradycardia   . Left corneal abrasion   . Abnormal chest x-ray   . Acute respiratory failure with hypoxia ([place])   . Atrial fibrillation with RVR ([place])   . Dysphagia, pharyngoesophageal phase   . History of ETT   . Encounter for central line placement   . Encounter for feeding tube placement   . Weakness of right lower extremity   . SOB (shortness of breath)   . Pneumonia   . Pressure injury of skin 01/16/2017  . Acute respiratory failure ([place])   . Hyponatremia with decreased serum osmolality   . Hyponatremia   . Quadriplegia and quadriparesis ([place]) 01/14/2017  . GBS (Guillain Barre syndrome) ([place]) 01/13/2017  . Weakness 01/12/2017  . Hypokalemia 01/12/2017  . Endometriosis 12/05/2013  . Chronic low back pain 08/19/2012  . HYPERLIPIDEMIA 12/10/2010  . COLONIC POLYPS, HX OF 12/13/2009  . HYPERGLYCEMIA, FASTING 11/22/2008  . Depression 02/07/2008  . ANOSMIA 02/07/2008  . HEADACHE, CHRONIC 02/07/2008  . MITRAL VALVE PROLAPSE 10/05/2007  . OSTEOPENIA 10/05/2007    [facility] 07/01/2017, 12:15 PM Theone Murdoch, OTR/L [phone] Phone: [phone] 12:15 PM  07/01/17 [facility] [address] [place], [place], [address] Phone: [phone]   Fax:  [phone]  Name: Maniya Donovan MRN: [id number] Date of Birth: 1951/01/07

## 2017-07-02 ENCOUNTER — Ambulatory Visit: Payer: BLUE CROSS/BLUE SHIELD | Admitting: Occupational Therapy

## 2017-07-02 DIAGNOSIS — R2681 Unsteadiness on feet: Secondary | ICD-10-CM

## 2017-07-02 DIAGNOSIS — R278 Other lack of coordination: Secondary | ICD-10-CM

## 2017-07-02 DIAGNOSIS — M6281 Muscle weakness (generalized): Secondary | ICD-10-CM

## 2017-07-02 DIAGNOSIS — R208 Other disturbances of skin sensation: Secondary | ICD-10-CM

## 2017-07-02 NOTE — Therapy (Signed)
[facility] [address] [place], [place], [address] Phone: [phone]   Fax:  [phone]  Occupational Therapy Treatment  Patient Details  Name: Joanne Morrison MRN: [id number] Date of Birth: Jan 04, 1951 Referring Provider: Dr Naaman Plummer  Encounter Date: 07/02/2017      OT End of Session - 07/02/17 1026    Visit Number 37   Number of Visits 60   Date for OT Re-Evaluation 09/30/17   Authorization Type BCBS  no visit limit   Authorization - Visit Number 72   OT Start Time 1020   OT Stop Time 1100   OT Time Calculation (min) 40 min   Activity Tolerance Patient tolerated treatment well      Past Medical History:  Diagnosis Date  . Anxiety   . Arthritis    back  . Chronic cystitis   . Chronic low back pain   . Depression   . Diverticulosis of colon   . Hemorrhoids   . History of colon polyps    09-24-2009  rectal hyperplastic polyp/   and 2016 non-cancerous  . History of endometriosis   . History of panic attacks   . Hyperlipidemia   . Osteopenia   . Pelvic pain   . Sensation of pressure in bladder area   . Wears glasses     Past Surgical History:  Procedure Laterality Date  . BUNIONECTOMY Left 2004 approx  . CARPAL TUNNEL RELEASE Bilateral right 2014;  left 2007  . COLONOSCOPY W/ POLYPECTOMY  09/24/2009  . CYSTO WITH HYDRODISTENSION [name] 10/15/2016   Procedure: CYSTOSCOPY/HYDRODISTENSION AND MARCAINE INSTILLATION;  Surgeon: Rana Snare, MD;  Location: [facility];  Service: Urology;  Laterality: [name];  . CYSTO/  HYDRODISTENTION/  INSTILLATION THERAPY  1997 approx  . HYSTEROSCOPY W/D&C  11/01/2004   POLYPECTOMY  . IR GASTROSTOMY TUBE REMOVAL  03/20/2017  . IR GENERIC HISTORICAL  01/26/2017   IR GASTROSTOMY TUBE MOD SED 01/26/2017 Sandi Mariscal, MD [facility]  . LAPAROSCOPIC VAGINAL HYSTERECTOMY WITH SALPINGO OOPHORECTOMY Bilateral 04/20/2012  . TUBAL LIGATION  yrs ago    There were no  vitals filed for this visit.      Subjective Assessment - 07/02/17 1025    Pertinent History hx of back pain (went to pain management center)   Currently in Pain? No/denies              Treatment:supine holding 2 kg ball(4.4 lbs) to perform chest press and shoulder flexion, x 10 reps each. Supine shoulder circles 10 reps each direction with 1 lbs weight followed by shoulder flexion with 1 lbs weight for each UE individually. Hemiglide with bilateral UE's for low range shoulder flexion x 10 reps Arm bike x 6 mins level 5 for conditioning Wall push ups 2 sets of 10 reps Low range functional reach with RUE, mid to high range reaching with LUE, min v.c facilitation Prone on elbows lifting chest x 10 reps, min v.c Attempted cat cow but discontinued due to discomfort.                  OT Short Term Goals - 06/30/17 1202      OT SHORT TERM GOAL #8   Title Pt will demonstrate RUE shoulder flexion to retrieve a lightweight object at 60 with only minimal compensations/ winging.   Status Achieved  grossly 60*     OT SHORT TERM GOAL  #9   TITLE Pt will demonstrate ability to retrieve a lightweight object  at 110 shoulder flexion with LUE without reports of pain.   Status Achieved  met no reports of pain      OT SHORT TERM GOAL  #10   TITLE I with updated HEP- due 08/14/17   Time 6   Period Weeks   Status New     OT SHORT TERM GOAL  #11   TITLE Pt will demonstrate improved bilateral UE strength and functional use as evidenced by drying and styling her hair with no more than 2 rest breaks.   Baseline required 4 rest breaks today   Time 6   Period Weeks   Status New     OT SHORT TERM GOAL  #12   TITLE Pt will demonstrate ability to retrieve a lightweight  object at 75* with RUE with only min compensations/ scapular winging   Baseline mod compensations, mod winging when retrieving items greater than 60*   Time 6   Period Weeks   Status New     OT SHORT TERM GOAL   #13   TITLE Pt will report increased ease with opening containers and plastic bags.   Time 6   Period Weeks   Status New           OT Long Term Goals - 06/30/17 1205      OT LONG TERM GOAL #1   Status --     OT LONG TERM GOAL #4   Title Patient will utilize bilateral upper extremities to blow dry and style her hair with modified independence   Time 8   Period Weeks   Status Achieved  requires assistance for the back of her hair from husband     Long Term Additional Goals   Additional Long Term Goals Yes     OT LONG TERM GOAL #6   Title Patient will demonstrate sufficient strength for unilateral reach to retrieve a 3 lb object from overhead shelf while standing with right hand and left hand (not bilateral reach)   Time 8   Period Weeks   Status Partially Met  able to retrieve however pt demonstrates mod compensations with RUE     OT LONG TERM GOAL #7   Title Patient will demonstrate sufficient strength to get down to floor to clean tub, and get back up without assistance- LTG's due 06/27/17   Time 8   Period Weeks   Status Achieved  partially met     OT LONG TERM GOAL #8   Title Pt will demonstrate ability to retrieve a 3 lbs weight from overhead shelf with RUE with no more than min compensations.- due 09/27/17   Baseline mod compensations retrieving 3lbs weight from overhead shelf with RUE   Time 12   Period Weeks   Status On-going  moderate scapular winging, difficulty     OT LONG TERM GOAL  #9   Baseline Pt will resume performance of mod complex cooking and home management tasks at a modified independent level.   Time --   Period Weeks   Status Achieved     OT LONG TERM GOAL  #10   TITLE Pt will demonstrate ability to retrieve a lightweight object at 90* with LUE, with only min compensations, minimal scapular winging in prep for functional use.   Time 12   Period Weeks   Status New     OT LONG TERM GOAL  #11   TITLE Pt will demonstrate increased UE  strength and endurance as evidenced by washing a load  of dishes, including odd shaped items with no more than 1 rest break.   Baseline 4 rest breaks today   Time 12   Period Weeks   Status New               Plan - 07/02/17 1048    Clinical Impression Statement Ptreports se is felling better today and pt demonstrates improved right scapular stability today.   Rehab Potential Good   OT Frequency 2x / week   OT Duration 12 weeks   OT Treatment/Interventions Self-care/ADL training;Electrical Stimulation;Therapeutic exercise;Neuromuscular education;Energy conservation;Manual Therapy;Functional Mobility Training;DME and/or AE instruction;Visual/perceptual remediation/compensation;Cognitive remediation/compensation;Therapeutic activities;Balance training;Patient/family education;Therapeutic exercises;Passive range of motion   Plan strengthening, UE functional use   Consulted and Agree with Plan of Care Patient   Family Member Consulted husband      Patient will benefit from skilled therapeutic intervention in order to improve the following deficits and impairments:  Decreased coordination, Decreased range of motion, Decreased endurance, Decreased safety awareness, Decreased balance, Decreased cognition, Decreased strength, Impaired perceived functional ability, Impaired vision/preception, Impaired UE functional use, Impaired tone, Pain, Impaired sensation, Improper body mechanics, Decreased activity tolerance  Visit Diagnosis: Muscle weakness (generalized)  Other disturbances of skin sensation  Unsteadiness on feet  Other lack of coordination    Problem List Patient Active Problem List   Diagnosis Date Noted  . Bilateral rotator cuff syndrome 04/22/2017  . Acute blood loss anemia   . Physical deconditioning   . Tracheostomy status ([place])   . Hypoxemia   . Dysphagia   . Numbness and tingling   . S/P percutaneous endoscopic gastrostomy (PEG) tube placement ([place])   .  Bradycardia   . Left corneal abrasion   . Abnormal chest x-ray   . Acute respiratory failure with hypoxia ([place])   . Atrial fibrillation with RVR ([place])   . Dysphagia, pharyngoesophageal phase   . History of ETT   . Encounter for central line placement   . Encounter for feeding tube placement   . Weakness of right lower extremity   . SOB (shortness of breath)   . Pneumonia   . Pressure injury of skin 01/16/2017  . Acute respiratory failure ([place])   . Hyponatremia with decreased serum osmolality   . Hyponatremia   . Quadriplegia and quadriparesis ([place]) 01/14/2017  . GBS (Guillain Barre syndrome) ([place]) 01/13/2017  . Weakness 01/12/2017  . Hypokalemia 01/12/2017  . Endometriosis 12/05/2013  . Chronic low back pain 08/19/2012  . HYPERLIPIDEMIA 12/10/2010  . COLONIC POLYPS, HX OF 12/13/2009  . HYPERGLYCEMIA, FASTING 11/22/2008  . Depression 02/07/2008  . ANOSMIA 02/07/2008  . HEADACHE, CHRONIC 02/07/2008  . MITRAL VALVE PROLAPSE 10/05/2007  . OSTEOPENIA 10/05/2007    Joanne Morrison 07/02/2017, 10:49 AM  [facility] [address] [place], [place], [address] Phone: [phone]   Fax:  [phone]  Name: Joanne Morrison MRN: [id number] Date of Birth: 12/23/1950

## 2017-07-07 ENCOUNTER — Ambulatory Visit: Payer: BLUE CROSS/BLUE SHIELD | Admitting: Occupational Therapy

## 2017-07-07 DIAGNOSIS — R278 Other lack of coordination: Secondary | ICD-10-CM

## 2017-07-07 DIAGNOSIS — R208 Other disturbances of skin sensation: Secondary | ICD-10-CM

## 2017-07-07 DIAGNOSIS — R2681 Unsteadiness on feet: Secondary | ICD-10-CM

## 2017-07-07 DIAGNOSIS — M6281 Muscle weakness (generalized): Secondary | ICD-10-CM

## 2017-07-07 DIAGNOSIS — M25512 Pain in left shoulder: Secondary | ICD-10-CM

## 2017-07-07 NOTE — Patient Instructions (Signed)
Floor exercises to address shoulder stability:  1)  Lie on your stomach in the living room on the floor. Rest your forearms on either side of you. Lift your chest by pushing down through your forearms. Goal is to spread your shoulder blades apart as far as possible and hold for 3-5 seconds. Repeat 5 times every other day.      2)  Modified Push-Up    Lie on stomach with palms by shoulders, feet together.  Using arms, chest and shoulders, raise upper body. Keep back straight. Slowly lower upper body to floor, keeping knees bent. Breathe IN while pushing up and OUT when lowering.  Discontinue if any back pain.   Repeat 5 times. Do every other  day.  http://gt2.exer.us/887   Copyright  VHI. All rights reserved.

## 2017-07-07 NOTE — Therapy (Signed)
[facility] [address] [place], [place], [address] Phone: [phone]   Fax:  [phone]  Occupational Therapy Treatment  Patient Details  Name: Joanne Morrison MRN: [id number] Date of Birth: Apr 14, 1951 Referring Provider: Dr Naaman Plummer  Encounter Date: 07/07/2017      OT End of Session - 07/07/17 1115    Visit Number 38   Number of Visits 60   Date for OT Re-Evaluation 09/30/17   Authorization Type BCBS  no visit limit   Authorization - Visit Number 61   OT Start Time 6962   OT Stop Time 1148   OT Time Calculation (min) 46 min   Activity Tolerance Patient tolerated treatment well   Behavior During Therapy [facility] for tasks assessed/performed      Past Medical History:  Diagnosis Date  . Anxiety   . Arthritis    back  . Chronic cystitis   . Chronic low back pain   . Depression   . Diverticulosis of colon   . Hemorrhoids   . History of colon polyps    09-24-2009  rectal hyperplastic polyp/   and 2016 non-cancerous  . History of endometriosis   . History of panic attacks   . Hyperlipidemia   . Osteopenia   . Pelvic pain   . Sensation of pressure in bladder area   . Wears glasses     Past Surgical History:  Procedure Laterality Date  . BUNIONECTOMY Left 2004 approx  . CARPAL TUNNEL RELEASE Bilateral right 2014;  left 2007  . COLONOSCOPY W/ POLYPECTOMY  09/24/2009  . CYSTO WITH HYDRODISTENSION [name] 10/15/2016   Procedure: CYSTOSCOPY/HYDRODISTENSION AND MARCAINE INSTILLATION;  Surgeon: Rana Snare, MD;  Location: [facility];  Service: Urology;  Laterality: [name];  . CYSTO/  HYDRODISTENTION/  INSTILLATION THERAPY  1997 approx  . HYSTEROSCOPY W/D&C  11/01/2004   POLYPECTOMY  . IR GASTROSTOMY TUBE REMOVAL  03/20/2017  . IR GENERIC HISTORICAL  01/26/2017   IR GASTROSTOMY TUBE MOD SED 01/26/2017 Sandi Mariscal, MD [facility]  . LAPAROSCOPIC VAGINAL HYSTERECTOMY WITH SALPINGO OOPHORECTOMY Bilateral  04/20/2012  . TUBAL LIGATION  yrs ago    There were no vitals filed for this visit.      Subjective Assessment - 07/07/17 1022    Subjective  Patient thinks that she was able to style her hair with only two rest breaks this morning.     Patient is accompained by: Family member   Pertinent History hx of back pain (went to pain management center)   Currently in Pain? No/denies   Pain Score 0-No pain                      OT Treatments/Exercises (OP) - 07/07/17 0001      Exercises   Exercises Shoulder     Shoulder Exercises: Supine   Flexion AROM;Strengthening;Both;15 reps;Weights   Shoulder Flexion Weight (lbs) 5.5 weighted ball - held bilaterally   Other Supine Exercises Unilateral chest press left with 2.2. lbs,  right with 5.5 lbs x 10 reps     Neurological Re-education Exercises   Other Exercises 1 Neuromuscular reeducation to address reach pattern for mid to high reach with left UE - incorportaing more neutral rotation of humerus throughout higher reach.                  OT Education - 07/07/17 1114    Education provided Yes   Education Details added floor exercises for scapular  stability   Person(s) Educated Patient;Spouse   Methods Explanation;Demonstration;Tactile cues;Verbal cues;Handout   Comprehension Verbalized understanding;Returned demonstration          OT Short Term Goals - 06/30/17 1202      OT SHORT TERM GOAL #8   Title Pt will demonstrate RUE shoulder flexion to retrieve a lightweight object at 60 with only minimal compensations/ winging.   Status Achieved  grossly 60*     OT SHORT TERM GOAL  #9   TITLE Pt will demonstrate ability to retrieve a lightweight object at 110 shoulder flexion with LUE without reports of pain.   Status Achieved  met no reports of pain      OT SHORT TERM GOAL  #10   TITLE I with updated HEP- due 08/14/17   Time 6   Period Weeks   Status New     OT SHORT TERM GOAL  #11   TITLE Pt will  demonstrate improved bilateral UE strength and functional use as evidenced by drying and styling her hair with no more than 2 rest breaks.   Baseline required 4 rest breaks today   Time 6   Period Weeks   Status New     OT SHORT TERM GOAL  #12   TITLE Pt will demonstrate ability to retrieve a lightweight  object at 75* with RUE with only min compensations/ scapular winging   Baseline mod compensations, mod winging when retrieving items greater than 60*   Time 6   Period Weeks   Status New     OT SHORT TERM GOAL  #13   TITLE Pt will report increased ease with opening containers and plastic bags.   Time 6   Period Weeks   Status New           OT Long Term Goals - 06/30/17 1205      OT LONG TERM GOAL #1   Status --     OT LONG TERM GOAL #4   Title Patient will utilize bilateral upper extremities to blow dry and style her hair with modified independence   Time 8   Period Weeks   Status Achieved  requires assistance for the back of her hair from husband     Long Term Additional Goals   Additional Long Term Goals Yes     OT LONG TERM GOAL #6   Title Patient will demonstrate sufficient strength for unilateral reach to retrieve a 3 lb object from overhead shelf while standing with right hand and left hand (not bilateral reach)   Time 8   Period Weeks   Status Partially Met  able to retrieve however pt demonstrates mod compensations with RUE     OT LONG TERM GOAL #7   Title Patient will demonstrate sufficient strength to get down to floor to clean tub, and get back up without assistance- LTG's due 06/27/17   Time 8   Period Weeks   Status Achieved  partially met     OT LONG TERM GOAL #8   Title Pt will demonstrate ability to retrieve a 3 lbs weight from overhead shelf with RUE with no more than min compensations.- due 09/27/17   Baseline mod compensations retrieving 3lbs weight from overhead shelf with RUE   Time 12   Period Weeks   Status On-going  moderate scapular  winging, difficulty     OT LONG TERM GOAL  #9   Baseline Pt will resume performance of mod complex cooking and home management tasks  at a modified independent level.   Time --   Period Weeks   Status Achieved     OT LONG TERM GOAL  #10   TITLE Pt will demonstrate ability to retrieve a lightweight object at 90* with LUE, with only min compensations, minimal scapular winging in prep for functional use.   Time 12   Period Weeks   Status New     OT LONG TERM GOAL  #11   TITLE Pt will demonstrate increased UE strength and endurance as evidenced by washing a load of dishes, including odd shaped items with no more than 1 rest break.   Baseline 4 rest breaks today   Time 12   Period Weeks   Status New               Plan - 07/07/17 1115    Clinical Impression Statement Patient continues to show steady improvement, discussed possibility of reducing to 1x/week.     Rehab Potential Good   OT Frequency 2x / week   OT Duration 12 weeks   OT Treatment/Interventions Self-care/ADL training;Electrical Stimulation;Therapeutic exercise;Neuromuscular education;Energy conservation;Manual Therapy;Functional Mobility Training;DME and/or AE instruction;Visual/perceptual remediation/compensation;Cognitive remediation/compensation;Therapeutic activities;Balance training;Patient/family education;Therapeutic exercises;Passive range of motion   Plan Mid to high reach, LUE function   OT Home Exercise Plan Added to HEP - shoulder strengthening, floor exxercises for scapular stability   Consulted and Agree with Plan of Care Patient   Family Member Consulted husband      Patient will benefit from skilled therapeutic intervention in order to improve the following deficits and impairments:  Decreased coordination, Decreased range of motion, Decreased endurance, Decreased safety awareness, Decreased balance, Decreased cognition, Decreased strength, Impaired perceived functional ability, Impaired  vision/preception, Impaired UE functional use, Impaired tone, Pain, Impaired sensation, Improper body mechanics, Decreased activity tolerance  Visit Diagnosis: Muscle weakness (generalized)  Other disturbances of skin sensation  Unsteadiness on feet  Other lack of coordination  Acute pain of left shoulder    Problem List Patient Active Problem List   Diagnosis Date Noted  . Bilateral rotator cuff syndrome 04/22/2017  . Acute blood loss anemia   . Physical deconditioning   . Tracheostomy status ([place])   . Hypoxemia   . Dysphagia   . Numbness and tingling   . S/P percutaneous endoscopic gastrostomy (PEG) tube placement ([place])   . Bradycardia   . Left corneal abrasion   . Abnormal chest x-ray   . Acute respiratory failure with hypoxia ([place])   . Atrial fibrillation with RVR ([place])   . Dysphagia, pharyngoesophageal phase   . History of ETT   . Encounter for central line placement   . Encounter for feeding tube placement   . Weakness of right lower extremity   . SOB (shortness of breath)   . Pneumonia   . Pressure injury of skin 01/16/2017  . Acute respiratory failure ([place])   . Hyponatremia with decreased serum osmolality   . Hyponatremia   . Quadriplegia and quadriparesis ([place]) 01/14/2017  . GBS (Guillain Barre syndrome) ([place]) 01/13/2017  . Weakness 01/12/2017  . Hypokalemia 01/12/2017  . Endometriosis 12/05/2013  . Chronic low back pain 08/19/2012  . HYPERLIPIDEMIA 12/10/2010  . COLONIC POLYPS, HX OF 12/13/2009  . HYPERGLYCEMIA, FASTING 11/22/2008  . Depression 02/07/2008  . ANOSMIA 02/07/2008  . HEADACHE, CHRONIC 02/07/2008  . MITRAL VALVE PROLAPSE 10/05/2007  . OSTEOPENIA 10/05/2007    Mariah Milling, OTR/L 07/07/2017, 11:19 AM  [facility] [address]  [place], [place], [address] Phone: [phone]   Fax:  [phone]  Name: Joanne Morrison MRN: [id number] Date of Birth: 10/11/1951

## 2017-07-09 ENCOUNTER — Ambulatory Visit: Payer: BLUE CROSS/BLUE SHIELD | Admitting: Occupational Therapy

## 2017-07-09 ENCOUNTER — Encounter: Payer: Self-pay | Admitting: Occupational Therapy

## 2017-07-09 DIAGNOSIS — R2681 Unsteadiness on feet: Secondary | ICD-10-CM

## 2017-07-09 DIAGNOSIS — R208 Other disturbances of skin sensation: Secondary | ICD-10-CM

## 2017-07-09 DIAGNOSIS — R278 Other lack of coordination: Secondary | ICD-10-CM

## 2017-07-09 DIAGNOSIS — M6281 Muscle weakness (generalized): Secondary | ICD-10-CM | POA: Diagnosis not present

## 2017-07-09 DIAGNOSIS — M25512 Pain in left shoulder: Secondary | ICD-10-CM

## 2017-07-09 NOTE — Therapy (Signed)
[facility] [address] [place], [place], [address] Phone: [phone]   Fax:  [phone]  Occupational Therapy Treatment  Patient Details  Name: Joanne Morrison MRN: [id number] Date of Birth: [birth year] Referring Provider: Dr Naaman Plummer  Encounter Date: 07/09/2017      OT End of Session - 07/09/17 1255    Visit Number 39   Number of Visits 60   Date for OT Re-Evaluation 09/30/17   Authorization Type BCBS  no visit limit   Authorization - Visit Number 56   OT Start Time 3235   OT Stop Time 1100   OT Time Calculation (min) 45 min   Activity Tolerance Patient tolerated treatment well   Behavior During Therapy [facility] for tasks assessed/performed      Past Medical History:  Diagnosis Date  . Anxiety   . Arthritis    back  . Chronic cystitis   . Chronic low back pain   . Depression   . Diverticulosis of colon   . Hemorrhoids   . History of colon polyps    09-24-2009  rectal hyperplastic polyp/   and 2016 non-cancerous  . History of endometriosis   . History of panic attacks   . Hyperlipidemia   . Osteopenia   . Pelvic pain   . Sensation of pressure in bladder area   . Wears glasses     Past Surgical History:  Procedure Laterality Date  . BUNIONECTOMY Left 2004 approx  . CARPAL TUNNEL RELEASE Bilateral right 2014;  left 2007  . COLONOSCOPY W/ POLYPECTOMY  09/24/2009  . CYSTO WITH HYDRODISTENSION [name] 10/15/2016   Procedure: CYSTOSCOPY/HYDRODISTENSION AND MARCAINE INSTILLATION;  Surgeon: Rana Snare, MD;  Location: [facility];  Service: Urology;  Laterality: [name];  . CYSTO/  HYDRODISTENTION/  INSTILLATION THERAPY  1997 approx  . HYSTEROSCOPY W/D&C  11/01/2004   POLYPECTOMY  . IR GASTROSTOMY TUBE REMOVAL  03/20/2017  . IR GENERIC HISTORICAL  01/26/2017   IR GASTROSTOMY TUBE MOD SED 01/26/2017 Sandi Mariscal, MD [facility]-INTERV RAD  . LAPAROSCOPIC VAGINAL HYSTERECTOMY WITH SALPINGO OOPHORECTOMY Bilateral  04/20/2012  . TUBAL LIGATION  yrs ago    There were no vitals filed for this visit.      Subjective Assessment - 07/09/17 1032    Currently in Pain? Yes   Pain Score 2    Pain Location Arm   Pain Orientation Left   Pain Descriptors / Indicators Aching;Dull   Pain Onset In the past 7 days   Pain Frequency Intermittent   Aggravating Factors  from workout   Pain Relieving Factors rest   Multiple Pain Sites No                      OT Treatments/Exercises (OP) - 07/09/17 0001      Neurological Re-education Exercises   Other Exercises 1 Neuromuscular reeducation to address mid to high reach in bilateral UE's.     Other Exercises 2 Worked on assisted very high reach overhead to incorporate LE into task.  Patient with improrved ability to reach overhead, but still struggles with sustaining activation in high ranges.  Worked specifically with left arm in mid level reach - facilitating external rotation ( or reduction in internal rotation) during reach pattern.  Worked in supine on resistive ER training with 1,2,3, and 4 lb weight.  Patient unable to maintain alignment / form with 4 lb weight so resistance chosen was 3 lbs.  Patient still unable to complete  any resistance with band for ER left.                  OT Education - 07/09/17 1254    Education provided Yes   Education Details importance of rotation of humerus for mid tohigh reach   Person(s) Educated Patient;Spouse   Methods Explanation;Demonstration;Tactile cues   Comprehension Verbalized understanding;Returned demonstration;Need further instruction          OT Short Term Goals - 06/30/17 1202      OT SHORT TERM GOAL #8   Title Pt will demonstrate RUE shoulder flexion to retrieve a lightweight object at 60 with only minimal compensations/ winging.   Status Achieved  grossly 60*     OT SHORT TERM GOAL  #9   TITLE Pt will demonstrate ability to retrieve a lightweight object at 110 shoulder flexion  with LUE without reports of pain.   Status Achieved  met no reports of pain      OT SHORT TERM GOAL  #10   TITLE I with updated HEP- due 08/14/17   Time 6   Period Weeks   Status New     OT SHORT TERM GOAL  #11   TITLE Pt will demonstrate improved bilateral UE strength and functional use as evidenced by drying and styling her hair with no more than 2 rest breaks.   Baseline required 4 rest breaks today   Time 6   Period Weeks   Status New     OT SHORT TERM GOAL  #12   TITLE Pt will demonstrate ability to retrieve a lightweight  object at 75* with RUE with only min compensations/ scapular winging   Baseline mod compensations, mod winging when retrieving items greater than 60*   Time 6   Period Weeks   Status New     OT SHORT TERM GOAL  #13   TITLE Pt will report increased ease with opening containers and plastic bags.   Time 6   Period Weeks   Status New           OT Long Term Goals - 06/30/17 1205      OT LONG TERM GOAL #1   Status --     OT LONG TERM GOAL #4   Title Patient will utilize bilateral upper extremities to blow dry and style her hair with modified independence   Time 8   Period Weeks   Status Achieved  requires assistance for the back of her hair from husband     Long Term Additional Goals   Additional Long Term Goals Yes     OT LONG TERM GOAL #6   Title Patient will demonstrate sufficient strength for unilateral reach to retrieve a 3 lb object from overhead shelf while standing with right hand and left hand (not bilateral reach)   Time 8   Period Weeks   Status Partially Met  able to retrieve however pt demonstrates mod compensations with RUE     OT LONG TERM GOAL #7   Title Patient will demonstrate sufficient strength to get down to floor to clean tub, and get back up without assistance- LTG's due 06/27/17   Time 8   Period Weeks   Status Achieved  partially met     OT LONG TERM GOAL #8   Title Pt will demonstrate ability to retrieve a 3  lbs weight from overhead shelf with RUE with no more than min compensations.- due 09/27/17   Baseline mod compensations retrieving 3lbs  weight from overhead shelf with RUE   Time 12   Period Weeks   Status On-going  moderate scapular winging, difficulty     OT LONG TERM GOAL  #9   Baseline Pt will resume performance of mod complex cooking and home management tasks at a modified independent level.   Time --   Period Weeks   Status Achieved     OT LONG TERM GOAL  #10   TITLE Pt will demonstrate ability to retrieve a lightweight object at 90* with LUE, with only min compensations, minimal scapular winging in prep for functional use.   Time 12   Period Weeks   Status New     OT LONG TERM GOAL  #11   TITLE Pt will demonstrate increased UE strength and endurance as evidenced by washing a load of dishes, including odd shaped items with no more than 1 rest break.   Baseline 4 rest breaks today   Time 12   Period Weeks   Status New               Plan - 07/09/17 1255    Clinical Impression Statement Patient shows steady progress functionally due to improved motor control, and strength in upper extremities.     Rehab Potential Good   OT Frequency 2x / week   OT Duration 12 weeks   OT Treatment/Interventions Self-care/ADL training;Electrical Stimulation;Therapeutic exercise;Neuromuscular education;Energy conservation;Manual Therapy;Functional Mobility Training;DME and/or AE instruction;Visual/perceptual remediation/compensation;Cognitive remediation/compensation;Therapeutic activities;Balance training;Patient/family education;Therapeutic exercises;Passive range of motion   Plan mid to high reach, LUE function   OT Home Exercise Plan Added to HEP - shoulder strengthening, floor exxercises for scapular stability   Consulted and Agree with Plan of Care Patient   Family Member Consulted husband      Patient will benefit from skilled therapeutic intervention in order to improve the  following deficits and impairments:  Decreased coordination, Decreased range of motion, Decreased endurance, Decreased safety awareness, Decreased balance, Decreased cognition, Decreased strength, Impaired perceived functional ability, Impaired vision/preception, Impaired UE functional use, Impaired tone, Pain, Impaired sensation, Improper body mechanics, Decreased activity tolerance  Visit Diagnosis: Muscle weakness (generalized)  Other disturbances of skin sensation  Unsteadiness on feet  Other lack of coordination  Acute pain of left shoulder    Problem List Patient Active Problem List   Diagnosis Date Noted  . Bilateral rotator cuff syndrome 04/22/2017  . Acute blood loss anemia   . Physical deconditioning   . Tracheostomy status ([place])   . Hypoxemia   . Dysphagia   . Numbness and tingling   . S/P percutaneous endoscopic gastrostomy (PEG) tube placement ([place])   . Bradycardia   . Left corneal abrasion   . Abnormal chest x-ray   . Acute respiratory failure with hypoxia ([place])   . Atrial fibrillation with RVR ([place])   . Dysphagia, pharyngoesophageal phase   . History of ETT   . Encounter for central line placement   . Encounter for feeding tube placement   . Weakness of right lower extremity   . SOB (shortness of breath)   . Pneumonia   . Pressure injury of skin 01/16/2017  . Acute respiratory failure ([place])   . Hyponatremia with decreased serum osmolality   . Hyponatremia   . Quadriplegia and quadriparesis ([place]) 01/14/2017  . GBS (Guillain Barre syndrome) ([place]) 01/13/2017  . Weakness 01/12/2017  . Hypokalemia 01/12/2017  . Endometriosis 12/05/2013  . Chronic low back pain 08/19/2012  . HYPERLIPIDEMIA 12/10/2010  . COLONIC POLYPS,  HX OF 12/13/2009  . HYPERGLYCEMIA, FASTING 11/22/2008  . Depression 02/07/2008  . ANOSMIA 02/07/2008  . HEADACHE, CHRONIC 02/07/2008  . MITRAL VALVE PROLAPSE 10/05/2007  . OSTEOPENIA 10/05/2007    Mariah Milling , OTR/L 07/09/2017,  12:57 PM  [facility] [address] [place], [place], [address] Phone: [phone]   Fax:  [phone]  Name: Joanne Morrison MRN: [id number] Date of Birth: [birth year]

## 2017-07-14 ENCOUNTER — Ambulatory Visit: Payer: BLUE CROSS/BLUE SHIELD | Admitting: Occupational Therapy

## 2017-07-14 DIAGNOSIS — R208 Other disturbances of skin sensation: Secondary | ICD-10-CM

## 2017-07-14 DIAGNOSIS — R278 Other lack of coordination: Secondary | ICD-10-CM

## 2017-07-14 DIAGNOSIS — M6281 Muscle weakness (generalized): Secondary | ICD-10-CM | POA: Diagnosis not present

## 2017-07-14 NOTE — Therapy (Signed)
[facility] [address] [place], [place], [address] Phone: [phone]   Fax:  [phone]  Occupational Therapy Treatment  Patient Details  Name: Joanne Morrison MRN: [id number] Date of Birth: Nov 13, 1951 Referring Provider: Dr Naaman Plummer  Encounter Date: 07/14/2017      OT End of Session - 07/14/17 1258    Visit Number 40   Number of Visits 60   Date for OT Re-Evaluation 09/30/17   Authorization Type BCBS  no visit limit   Authorization - Visit Number 40   OT Start Time 1108   OT Stop Time 1150   OT Time Calculation (min) 42 min   Activity Tolerance Patient tolerated treatment well   Behavior During Therapy [facility] for tasks assessed/performed      Past Medical History:  Diagnosis Date  . Anxiety   . Arthritis    back  . Chronic cystitis   . Chronic low back pain   . Depression   . Diverticulosis of colon   . Hemorrhoids   . History of colon polyps    09-24-2009  rectal hyperplastic polyp/   and 2016 non-cancerous  . History of endometriosis   . History of panic attacks   . Hyperlipidemia   . Osteopenia   . Pelvic pain   . Sensation of pressure in bladder area   . Wears glasses     Past Surgical History:  Procedure Laterality Date  . BUNIONECTOMY Left 2004 approx  . CARPAL TUNNEL RELEASE Bilateral right 2014;  left 2007  . COLONOSCOPY W/ POLYPECTOMY  09/24/2009  . CYSTO WITH HYDRODISTENSION [name] 10/15/2016   Procedure: CYSTOSCOPY/HYDRODISTENSION AND MARCAINE INSTILLATION;  Surgeon: Rana Snare, MD;  Location: [facility];  Service: Urology;  Laterality: [name];  . CYSTO/  HYDRODISTENTION/  INSTILLATION THERAPY  1997 approx  . HYSTEROSCOPY W/D&C  11/01/2004   POLYPECTOMY  . IR GASTROSTOMY TUBE REMOVAL  03/20/2017  . IR GENERIC HISTORICAL  01/26/2017   IR GASTROSTOMY TUBE MOD SED 01/26/2017 Sandi Mariscal, MD [facility]  . LAPAROSCOPIC VAGINAL HYSTERECTOMY WITH SALPINGO OOPHORECTOMY Bilateral  04/20/2012  . TUBAL LIGATION  yrs ago    There were no vitals filed for this visit.      Subjective Assessment - 07/14/17 1258    Pertinent History hx of back pain (went to pain management center)   Currently in Pain? No/denies        Treatment:wall pushups, 2 sets of 10 reps,wall slides with left and right UE's, min v.c  prone on elbows lifting and lowering chest, cat and cow positions in quadraped with min facilitation/ v.c Supine triceps extension overhead with bilateral UE's x 15 reps with 5 .5 lbs weight x 15 reps, Shoulder flexion and external rotation in supine with RUE with 2.2 lbs ball, and LUE with 1 lbs ball AA/ROM shoulder flexion in mid to high range with hemiglide and UE ranger, min facilitation for proper positioning. Arm bike x 5 mins level 4 for conditioning.                        OT Short Term Goals - 06/30/17 1202      OT SHORT TERM GOAL #8   Title Pt will demonstrate RUE shoulder flexion to retrieve a lightweight object at 60 with only minimal compensations/ winging.   Status Achieved  grossly 60*     OT SHORT TERM GOAL  #9   TITLE Pt will demonstrate ability to retrieve  a lightweight object at 110 shoulder flexion with LUE without reports of pain.   Status Achieved  met no reports of pain      OT SHORT TERM GOAL  #10   TITLE I with updated HEP- due 08/14/17   Time 6   Period Weeks   Status New     OT SHORT TERM GOAL  #11   TITLE Pt will demonstrate improved bilateral UE strength and functional use as evidenced by drying and styling her hair with no more than 2 rest breaks.   Baseline required 4 rest breaks today   Time 6   Period Weeks   Status New     OT SHORT TERM GOAL  #12   TITLE Pt will demonstrate ability to retrieve a lightweight  object at 75* with RUE with only min compensations/ scapular winging   Baseline mod compensations, mod winging when retrieving items greater than 60*   Time 6   Period Weeks   Status New      OT SHORT TERM GOAL  #13   TITLE Pt will report increased ease with opening containers and plastic bags.   Time 6   Period Weeks   Status New           OT Long Term Goals - 06/30/17 1205      OT LONG TERM GOAL #1   Status --     OT LONG TERM GOAL #4   Title Patient will utilize bilateral upper extremities to blow dry and style her hair with modified independence   Time 8   Period Weeks   Status Achieved  requires assistance for the back of her hair from husband     Long Term Additional Goals   Additional Long Term Goals Yes     OT LONG TERM GOAL #6   Title Patient will demonstrate sufficient strength for unilateral reach to retrieve a 3 lb object from overhead shelf while standing with right hand and left hand (not bilateral reach)   Time 8   Period Weeks   Status Partially Met  able to retrieve however pt demonstrates mod compensations with RUE     OT LONG TERM GOAL #7   Title Patient will demonstrate sufficient strength to get down to floor to clean tub, and get back up without assistance- LTG's due 06/27/17   Time 8   Period Weeks   Status Achieved  partially met     OT LONG TERM GOAL #8   Title Pt will demonstrate ability to retrieve a 3 lbs weight from overhead shelf with RUE with no more than min compensations.- due 09/27/17   Baseline mod compensations retrieving 3lbs weight from overhead shelf with RUE   Time 12   Period Weeks   Status On-going  moderate scapular winging, difficulty     OT LONG TERM GOAL  #9   Baseline Pt will resume performance of mod complex cooking and home management tasks at a modified independent level.   Time --   Period Weeks   Status Achieved     OT LONG TERM GOAL  #10   TITLE Pt will demonstrate ability to retrieve a lightweight object at 90* with LUE, with only min compensations, minimal scapular winging in prep for functional use.   Time 12   Period Weeks   Status New     OT LONG TERM GOAL  #11   TITLE Pt will  demonstrate increased UE strength and endurance as evidenced by  washing a load of dishes, including odd shaped items with no more than 1 rest break.   Baseline 4 rest breaks today   Time 12   Period Weeks   Status New               Plan - 07/14/17 1259    Clinical Impression Statement Pt is progressing towards goals with improving bilateral UE strength and functional use.   Rehab Potential Good   OT Frequency 2x / week   OT Duration 12 weeks   OT Treatment/Interventions Self-care/ADL training;Electrical Stimulation;Therapeutic exercise;Neuromuscular education;Energy conservation;Manual Therapy;Functional Mobility Training;DME and/or AE instruction;Visual/perceptual remediation/compensation;Cognitive remediation/compensation;Therapeutic activities;Balance training;Patient/family education;Therapeutic exercises;Passive range of motion   Plan continue to address mid to hight reach, LUE functional use.   OT Home Exercise Plan Added to HEP - shoulder strengthening, floor exxercises for scapular stability   Consulted and Agree with Plan of Care Patient   Family Member Consulted husband      Patient will benefit from skilled therapeutic intervention in order to improve the following deficits and impairments:  Decreased coordination, Decreased range of motion, Decreased endurance, Decreased safety awareness, Decreased balance, Decreased cognition, Decreased strength, Impaired perceived functional ability, Impaired vision/preception, Impaired UE functional use, Impaired tone, Pain, Impaired sensation, Improper body mechanics, Decreased activity tolerance  Visit Diagnosis: Muscle weakness (generalized)  Other disturbances of skin sensation  Other lack of coordination    Problem List Patient Active Problem List   Diagnosis Date Noted  . Bilateral rotator cuff syndrome 04/22/2017  . Acute blood loss anemia   . Physical deconditioning   . Tracheostomy status ([place])   . Hypoxemia   .  Dysphagia   . Numbness and tingling   . S/P percutaneous endoscopic gastrostomy (PEG) tube placement ([place])   . Bradycardia   . Left corneal abrasion   . Abnormal chest x-ray   . Acute respiratory failure with hypoxia ([place])   . Atrial fibrillation with RVR ([place])   . Dysphagia, pharyngoesophageal phase   . History of ETT   . Encounter for central line placement   . Encounter for feeding tube placement   . Weakness of right lower extremity   . SOB (shortness of breath)   . Pneumonia   . Pressure injury of skin 01/16/2017  . Acute respiratory failure ([place])   . Hyponatremia with decreased serum osmolality   . Hyponatremia   . Quadriplegia and quadriparesis ([place]) 01/14/2017  . GBS (Guillain Barre syndrome) ([place]) 01/13/2017  . Weakness 01/12/2017  . Hypokalemia 01/12/2017  . Endometriosis 12/05/2013  . Chronic low back pain 08/19/2012  . HYPERLIPIDEMIA 12/10/2010  . COLONIC POLYPS, HX OF 12/13/2009  . HYPERGLYCEMIA, FASTING 11/22/2008  . Depression 02/07/2008  . ANOSMIA 02/07/2008  . HEADACHE, CHRONIC 02/07/2008  . MITRAL VALVE PROLAPSE 10/05/2007  . OSTEOPENIA 10/05/2007    RINE,KATHRYN 07/14/2017, 1:00 PM  [facility] [address] [place], [place], [address] Phone: [phone]   Fax:  [phone]  Name: Joanne Morrison MRN: [id number] Date of Birth: 1951/01/11

## 2017-07-16 ENCOUNTER — Encounter: Payer: BLUE CROSS/BLUE SHIELD | Admitting: Occupational Therapy

## 2017-07-21 ENCOUNTER — Ambulatory Visit: Payer: BLUE CROSS/BLUE SHIELD | Attending: Physical Medicine & Rehabilitation | Admitting: Occupational Therapy

## 2017-07-21 DIAGNOSIS — R278 Other lack of coordination: Secondary | ICD-10-CM | POA: Diagnosis present

## 2017-07-21 DIAGNOSIS — R208 Other disturbances of skin sensation: Secondary | ICD-10-CM | POA: Insufficient documentation

## 2017-07-21 DIAGNOSIS — M25512 Pain in left shoulder: Secondary | ICD-10-CM | POA: Diagnosis present

## 2017-07-21 DIAGNOSIS — M6281 Muscle weakness (generalized): Secondary | ICD-10-CM | POA: Insufficient documentation

## 2017-07-21 NOTE — Therapy (Signed)
[facility] [address] [place], [place], [address] Phone: [phone]   Fax:  [phone]  Occupational Therapy Treatment  Patient Details  Name: Joanne Morrison MRN: [id number] Date of Birth: 1951/09/18 Referring Provider: Dr Naaman Plummer  Encounter Date: 07/21/2017      OT End of Session - 07/21/17 1113    Visit Number 41   Number of Visits 71   Date for OT Re-Evaluation 09/30/17   Authorization Type BCBS  no visit limit   Authorization - Visit Number 35   OT Start Time 0786   OT Stop Time 1145   OT Time Calculation (min) 40 min   Activity Tolerance Patient tolerated treatment well   Behavior During Therapy [facility] for tasks assessed/performed      Past Medical History:  Diagnosis Date  . Anxiety   . Arthritis    back  . Chronic cystitis   . Chronic low back pain   . Depression   . Diverticulosis of colon   . Hemorrhoids   . History of colon polyps    09-24-2009  rectal hyperplastic polyp/   and 2016 non-cancerous  . History of endometriosis   . History of panic attacks   . Hyperlipidemia   . Osteopenia   . Pelvic pain   . Sensation of pressure in bladder area   . Wears glasses     Past Surgical History:  Procedure Laterality Date  . BUNIONECTOMY Left 2004 approx  . CARPAL TUNNEL RELEASE Bilateral right 2014;  left 2007  . COLONOSCOPY W/ POLYPECTOMY  09/24/2009  . CYSTO WITH HYDRODISTENSION [name] 10/15/2016   Procedure: CYSTOSCOPY/HYDRODISTENSION AND MARCAINE INSTILLATION;  Surgeon: Rana Snare, MD;  Location: [facility];  Service: Urology;  Laterality: [name];  . CYSTO/  HYDRODISTENTION/  INSTILLATION THERAPY  1997 approx  . HYSTEROSCOPY W/D&C  11/01/2004   POLYPECTOMY  . IR GASTROSTOMY TUBE REMOVAL  03/20/2017  . IR GENERIC HISTORICAL  01/26/2017   IR GASTROSTOMY TUBE MOD SED 01/26/2017 Sandi Mariscal, MD [facility]-INTER[facility] RAD  . LAPAROSCOPIC VAGINAL HYSTERECTOMY WITH SALPINGO OOPHORECTOMY Bilateral  04/20/2012  . TUBAL LIGATION  yrs ago    There were no vitals filed for this visit.      Subjective Assessment - 07/21/17 1111    Subjective  Pt reports she has been doing wall pushups several time a day   Pertinent History hx of back pain (went to pain management center)   Currently in Pain? No/denies          Treatment:wall slides with left and right UE's, min v.c  cat and cow positions in quadraped with min facilitation/ v.c Supine triceps extension overhead with bilateral UE's x 15 reps with 5 .5 lbs weight x 15 reps, Shoulder flexion in supine with bilateral UE's with 5.5 lbs ball,  AA/ROM shoulder flexion in mid to high range with hemiglide and UE ranger, min facilitation for proper positioning. Followed by open chain reaching with LUE, mid-high reach, min v.c Closed chain shoulder flexion seated with bilateral UE's and medium ball followed by diagonals, to each side, min v.c/ facilitation Prone with LUE off mat to perform shoulder extension and rowing with 2 lbs weight, min v.c Arm bike x 5 mins level 4 for conditioning.                      OT Short Term Goals - 06/30/17 1202      OT SHORT TERM GOAL #8   Title  Pt will demonstrate RUE shoulder flexion to retrieve a lightweight object at 60 with only minimal compensations/ winging.   Status Achieved  grossly 60*     OT SHORT TERM GOAL  #9   TITLE Pt will demonstrate ability to retrieve a lightweight object at 110 shoulder flexion with LUE without reports of pain.   Status Achieved  met no reports of pain      OT SHORT TERM GOAL  #10   TITLE I with updated HEP- due 08/14/17   Time 6   Period Weeks   Status New     OT SHORT TERM GOAL  #11   TITLE Pt will demonstrate improved bilateral UE strength and functional use as evidenced by drying and styling her hair with no more than 2 rest breaks.   Baseline required 4 rest breaks today   Time 6   Period Weeks   Status New     OT SHORT TERM GOAL   #12   TITLE Pt will demonstrate ability to retrieve a lightweight  object at 75* with RUE with only min compensations/ scapular winging   Baseline mod compensations, mod winging when retrieving items greater than 60*   Time 6   Period Weeks   Status New     OT SHORT TERM GOAL  #13   TITLE Pt will report increased ease with opening containers and plastic bags.   Time 6   Period Weeks   Status New           OT Long Term Goals - 06/30/17 1205      OT LONG TERM GOAL #1   Status --     OT LONG TERM GOAL #4   Title Patient will utilize bilateral upper extremities to blow dry and style her hair with modified independence   Time 8   Period Weeks   Status Achieved  requires assistance for the back of her hair from husband     Long Term Additional Goals   Additional Long Term Goals Yes     OT LONG TERM GOAL #6   Title Patient will demonstrate sufficient strength for unilateral reach to retrieve a 3 lb object from overhead shelf while standing with right hand and left hand (not bilateral reach)   Time 8   Period Weeks   Status Partially Met  able to retrieve however pt demonstrates mod compensations with RUE     OT LONG TERM GOAL #7   Title Patient will demonstrate sufficient strength to get down to floor to clean tub, and get back up without assistance- LTG's due 06/27/17   Time 8   Period Weeks   Status Achieved  partially met     OT LONG TERM GOAL #8   Title Pt will demonstrate ability to retrieve a 3 lbs weight from overhead shelf with RUE with no more than min compensations.- due 09/27/17   Baseline mod compensations retrieving 3lbs weight from overhead shelf with RUE   Time 12   Period Weeks   Status On-going  moderate scapular winging, difficulty     OT LONG TERM GOAL  #9   Baseline Pt will resume performance of mod complex cooking and home management tasks at a modified independent level.   Time --   Period Weeks   Status Achieved     OT LONG TERM GOAL  #10    TITLE Pt will demonstrate ability to retrieve a lightweight object at 90* with LUE, with only min compensations,  minimal scapular winging in prep for functional use.   Time 12   Period Weeks   Status New     OT LONG TERM GOAL  #11   TITLE Pt will demonstrate increased UE strength and endurance as evidenced by washing a load of dishes, including odd shaped items with no more than 1 rest break.   Baseline 4 rest breaks today   Time 12   Period Weeks   Status New               Plan - 07/21/17 1222    Clinical Impression Statement Pt demonstrates excellent progress towards goals and improving LUE strength and functional use.   Rehab Potential Good   OT Frequency 2x / week   OT Duration 12 weeks   OT Treatment/Interventions Self-care/ADL training;Electrical Stimulation;Therapeutic exercise;Neuromuscular education;Energy conservation;Manual Therapy;Functional Mobility Training;DME and/or AE instruction;Visual/perceptual remediation/compensation;Cognitive remediation/compensation;Therapeutic activities;Balance training;Patient/family education;Therapeutic exercises;Passive range of motion   Plan continue to address mid to high reach and LUE functional use.   Consulted and Agree with Plan of Care Patient;Family member/caregiver      Patient will benefit from skilled therapeutic intervention in order to improve the following deficits and impairments:  Decreased coordination, Decreased range of motion, Decreased endurance, Decreased safety awareness, Decreased balance, Decreased cognition, Decreased strength, Impaired perceived functional ability, Impaired vision/preception, Impaired UE functional use, Impaired tone, Pain, Impaired sensation, Improper body mechanics, Decreased activity tolerance  Visit Diagnosis: Muscle weakness (generalized)  Other disturbances of skin sensation  Other lack of coordination    Problem List Patient Active Problem List   Diagnosis Date Noted  .  Bilateral rotator cuff syndrome 04/22/2017  . Acute blood loss anemia   . Physical deconditioning   . Tracheostomy status ([place])   . Hypoxemia   . Dysphagia   . Numbness and tingling   . S/P percutaneous endoscopic gastrostomy (PEG) tube placement ([place])   . Bradycardia   . Left corneal abrasion   . Abnormal chest x-ray   . Acute respiratory failure with hypoxia ([place])   . Atrial fibrillation with RVR ([place])   . Dysphagia, pharyngoesophageal phase   . History of ETT   . Encounter for central line placement   . Encounter for feeding tube placement   . Weakness of right lower extremity   . SOB (shortness of breath)   . Pneumonia   . Pressure injury of skin 01/16/2017  . Acute respiratory failure ([place])   . Hyponatremia with decreased serum osmolality   . Hyponatremia   . Quadriplegia and quadriparesis ([place]) 01/14/2017  . GBS (Guillain Barre syndrome) ([place]) 01/13/2017  . Weakness 01/12/2017  . Hypokalemia 01/12/2017  . Endometriosis 12/05/2013  . Chronic low back pain 08/19/2012  . HYPERLIPIDEMIA 12/10/2010  . COLONIC POLYPS, HX OF 12/13/2009  . HYPERGLYCEMIA, FASTING 11/22/2008  . Depression 02/07/2008  . ANOSMIA 02/07/2008  . HEADACHE, CHRONIC 02/07/2008  . MITRAL VALVE PROLAPSE 10/05/2007  . OSTEOPENIA 10/05/2007    Jaymie Mckiddy 07/21/2017, 12:26 PM  [facility] [address] [place], [place], [address] Phone: [phone]   Fax:  [phone]  Name: Joanne Morrison MRN: [id number] Date of Birth: 09/23/1951

## 2017-07-23 ENCOUNTER — Encounter: Payer: BLUE CROSS/BLUE SHIELD | Admitting: Occupational Therapy

## 2017-07-28 ENCOUNTER — Ambulatory Visit: Payer: BLUE CROSS/BLUE SHIELD | Admitting: Occupational Therapy

## 2017-07-28 DIAGNOSIS — R278 Other lack of coordination: Secondary | ICD-10-CM

## 2017-07-28 DIAGNOSIS — M6281 Muscle weakness (generalized): Secondary | ICD-10-CM

## 2017-07-28 DIAGNOSIS — R208 Other disturbances of skin sensation: Secondary | ICD-10-CM

## 2017-07-28 NOTE — Therapy (Signed)
[facility] [address] [place], [place], [address] Phone: [phone]   Fax:  [phone]  Occupational Therapy Treatment  Patient Details  Name: Joanne Morrison MRN: [id number] Date of Birth: 1951-04-27 Referring Provider: Dr Naaman Plummer  Encounter Date: 07/28/2017      OT End of Session - 07/28/17 1108    Visit Number 42   Number of Visits 13   Date for OT Re-Evaluation 09/30/17   Authorization Type BCBS  no visit limit   Authorization - Visit Number 19   OT Start Time 1103   OT Stop Time 1145   OT Time Calculation (min) 42 min   Activity Tolerance Patient tolerated treatment well   Behavior During Therapy [facility] for tasks assessed/performed      Past Medical History:  Diagnosis Date  . Anxiety   . Arthritis    back  . Chronic cystitis   . Chronic low back pain   . Depression   . Diverticulosis of colon   . Hemorrhoids   . History of colon polyps    09-24-2009  rectal hyperplastic polyp/   and 2016 non-cancerous  . History of endometriosis   . History of panic attacks   . Hyperlipidemia   . Osteopenia   . Pelvic pain   . Sensation of pressure in bladder area   . Wears glasses     Past Surgical History:  Procedure Laterality Date  . BUNIONECTOMY Left 2004 approx  . CARPAL TUNNEL RELEASE Bilateral right 2014;  left 2007  . COLONOSCOPY W/ POLYPECTOMY  09/24/2009  . CYSTO WITH HYDRODISTENSION [name] 10/15/2016   Procedure: CYSTOSCOPY/HYDRODISTENSION AND MARCAINE INSTILLATION;  Surgeon: Rana Snare, MD;  Location: [facility];  Service: Urology;  Laterality: [name];  . CYSTO/  HYDRODISTENTION/  INSTILLATION THERAPY  1997 approx  . HYSTEROSCOPY W/D&C  11/01/2004   POLYPECTOMY  . IR GASTROSTOMY TUBE REMOVAL  03/20/2017  . IR GENERIC HISTORICAL  01/26/2017   IR GASTROSTOMY TUBE MOD SED 01/26/2017 Sandi Mariscal, MD [facility]  . LAPAROSCOPIC VAGINAL HYSTERECTOMY WITH SALPINGO OOPHORECTOMY Bilateral  04/20/2012  . TUBAL LIGATION  yrs ago    There were no vitals filed for this visit.      Subjective Assessment - 07/28/17 1119    Subjective  Pt reports she can reach into cabinets with both hands now   Pertinent History hx of back pain (went to pain management center)   Currently in Pain? No/denies          Treatment: Prone, scapular stabilization exercise for shoulder flexion and rowing with RUE off edge of mat 10 reps each with  2 LBS weight Quadraped for cat/ cow positions, x 10 reps, Supine shoulder circles  With bilateral UE's each direction, and external rotation with each UE holding a 3 lbs weight, min v.c. Seated hemiglide for AA/ROM shoulder flexion bilateral UE's min facilitation, pt with increased scapular winging at right scapula today, min facilitation High level reaching with LUE to place remove items from overhead shelf, low range reaching with LUE to place remove graded clothespins, mid to high range reach with LUE to place remove clothespins, min v.c Arm bike x 5 mins level 4 for conditioning Closed chain shoulder flexion, mid range with medium ball, min facilitation right shoulder/ scapula                         OT Short Term Goals - 06/30/17 1202  OT SHORT TERM GOAL #8   Title Pt will demonstrate RUE shoulder flexion to retrieve a lightweight object at 60 with only minimal compensations/ winging.   Status Achieved  grossly 60*     OT SHORT TERM GOAL  #9   TITLE Pt will demonstrate ability to retrieve a lightweight object at 110 shoulder flexion with LUE without reports of pain.   Status Achieved  met no reports of pain      OT SHORT TERM GOAL  #10   TITLE I with updated HEP- due 08/14/17   Time 6   Period Weeks   Status New     OT SHORT TERM GOAL  #11   TITLE Pt will demonstrate improved bilateral UE strength and functional use as evidenced by drying and styling her hair with no more than 2 rest breaks.   Baseline required 4  rest breaks today   Time 6   Period Weeks   Status New     OT SHORT TERM GOAL  #12   TITLE Pt will demonstrate ability to retrieve a lightweight  object at 75* with RUE with only min compensations/ scapular winging   Baseline mod compensations, mod winging when retrieving items greater than 60*   Time 6   Period Weeks   Status New     OT SHORT TERM GOAL  #13   TITLE Pt will report increased ease with opening containers and plastic bags.   Time 6   Period Weeks   Status New           OT Long Term Goals - 06/30/17 1205      OT LONG TERM GOAL #1   Status --     OT LONG TERM GOAL #4   Title Patient will utilize bilateral upper extremities to blow dry and style her hair with modified independence   Time 8   Period Weeks   Status Achieved  requires assistance for the back of her hair from husband     Long Term Additional Goals   Additional Long Term Goals Yes     OT LONG TERM GOAL #6   Title Patient will demonstrate sufficient strength for unilateral reach to retrieve a 3 lb object from overhead shelf while standing with right hand and left hand (not bilateral reach)   Time 8   Period Weeks   Status Partially Met  able to retrieve however pt demonstrates mod compensations with RUE     OT LONG TERM GOAL #7   Title Patient will demonstrate sufficient strength to get down to floor to clean tub, and get back up without assistance- LTG's due 06/27/17   Time 8   Period Weeks   Status Achieved  partially met     OT LONG TERM GOAL #8   Title Pt will demonstrate ability to retrieve a 3 lbs weight from overhead shelf with RUE with no more than min compensations.- due 09/27/17   Baseline mod compensations retrieving 3lbs weight from overhead shelf with RUE   Time 12   Period Weeks   Status On-going  moderate scapular winging, difficulty     OT LONG TERM GOAL  #9   Baseline Pt will resume performance of mod complex cooking and home management tasks at a modified independent  level.   Time --   Period Weeks   Status Achieved     OT LONG TERM GOAL  #10   TITLE Pt will demonstrate ability to retrieve a lightweight object  at 75* with LUE, with only min compensations, minimal scapular winging in prep for functional use.   Time 12   Period Weeks   Status New     OT LONG TERM GOAL  #11   TITLE Pt will demonstrate increased UE strength and endurance as evidenced by washing a load of dishes, including odd shaped items with no more than 1 rest break.   Baseline 4 rest breaks today   Time 12   Period Weeks   Status New               Plan - 07/28/17 1114    Clinical Impression Statement Pt is progressing towards goals with increasing overall strength.   Rehab Potential Good   OT Frequency 2x / week   OT Duration 12 weeks   OT Treatment/Interventions Self-care/ADL training;Electrical Stimulation;Therapeutic exercise;Neuromuscular education;Energy conservation;Manual Therapy;Functional Mobility Training;DME and/or AE instruction;Visual/perceptual remediation/compensation;Cognitive remediation/compensation;Therapeutic activities;Balance training;Patient/family education;Therapeutic exercises;Passive range of motion   Plan continue to address functional overhead reach and LUE functional use   OT Home Exercise Plan Added to HEP - shoulder strengthening, floor exxercises for scapular stability   Consulted and Agree with Plan of Care Patient;Family member/caregiver   Family Member Consulted husband      Patient will benefit from skilled therapeutic intervention in order to improve the following deficits and impairments:  Decreased coordination, Decreased range of motion, Decreased endurance, Decreased safety awareness, Decreased balance, Decreased cognition, Decreased strength, Impaired perceived functional ability, Impaired vision/preception, Impaired UE functional use, Impaired tone, Pain, Impaired sensation, Improper body mechanics, Decreased activity  tolerance  Visit Diagnosis: Muscle weakness (generalized)  Other disturbances of skin sensation  Other lack of coordination  Unsteadiness on feet    Problem List Patient Active Problem List   Diagnosis Date Noted  . Bilateral rotator cuff syndrome 04/22/2017  . Acute blood loss anemia   . Physical deconditioning   . Tracheostomy status ([place])   . Hypoxemia   . Dysphagia   . Numbness and tingling   . S/P percutaneous endoscopic gastrostomy (PEG) tube placement ([place])   . Bradycardia   . Left corneal abrasion   . Abnormal chest x-ray   . Acute respiratory failure with hypoxia ([place])   . Atrial fibrillation with RVR ([place])   . Dysphagia, pharyngoesophageal phase   . History of ETT   . Encounter for central line placement   . Encounter for feeding tube placement   . Weakness of right lower extremity   . SOB (shortness of breath)   . Pneumonia   . Pressure injury of skin 01/16/2017  . Acute respiratory failure ([place])   . Hyponatremia with decreased serum osmolality   . Hyponatremia   . Quadriplegia and quadriparesis ([place]) 01/14/2017  . GBS (Guillain Barre syndrome) ([place]) 01/13/2017  . Weakness 01/12/2017  . Hypokalemia 01/12/2017  . Endometriosis 12/05/2013  . Chronic low back pain 08/19/2012  . HYPERLIPIDEMIA 12/10/2010  . COLONIC POLYPS, HX OF 12/13/2009  . HYPERGLYCEMIA, FASTING 11/22/2008  . Depression 02/07/2008  . ANOSMIA 02/07/2008  . HEADACHE, CHRONIC 02/07/2008  . MITRAL VALVE PROLAPSE 10/05/2007  . OSTEOPENIA 10/05/2007    Joanne Morrison 07/28/2017, 11:21 AM  [facility] [address] [place], [place], [address] Phone: [phone]   Fax:  [phone]  Name: Joanne Morrison MRN: [id number] Date of Birth: 09/07/1951

## 2017-07-30 ENCOUNTER — Ambulatory Visit: Payer: BLUE CROSS/BLUE SHIELD | Admitting: Occupational Therapy

## 2017-08-04 ENCOUNTER — Ambulatory Visit: Payer: BLUE CROSS/BLUE SHIELD | Admitting: Occupational Therapy

## 2017-08-04 DIAGNOSIS — R278 Other lack of coordination: Secondary | ICD-10-CM

## 2017-08-04 DIAGNOSIS — M25512 Pain in left shoulder: Secondary | ICD-10-CM

## 2017-08-04 DIAGNOSIS — R208 Other disturbances of skin sensation: Secondary | ICD-10-CM

## 2017-08-04 DIAGNOSIS — M6281 Muscle weakness (generalized): Secondary | ICD-10-CM | POA: Diagnosis not present

## 2017-08-04 NOTE — Therapy (Signed)
[facility] [address] [place], [place], [address] Phone: [phone]   Fax:  [phone]  Occupational Therapy Treatment  Patient Details  Name: Joanne Morrison MRN: [id number] Date of Birth: [birth year] Referring Provider: Dr Naaman Plummer  Encounter Date: 08/04/2017      OT End of Session - 08/04/17 1703    Visit Number 43   Number of Visits 60   Date for OT Re-Evaluation 09/30/17   Authorization Type BCBS  no visit limit   Authorization - Visit Number 63   OT Start Time 4037   OT Stop Time 1145   OT Time Calculation (min) 40 min   Activity Tolerance Patient tolerated treatment well   Behavior During Therapy [facility] for tasks assessed/performed      Past Medical History:  Diagnosis Date  . Anxiety   . Arthritis    back  . Chronic cystitis   . Chronic low back pain   . Depression   . Diverticulosis of colon   . Hemorrhoids   . History of colon polyps    09-24-2009  rectal hyperplastic polyp/   and 2016 non-cancerous  . History of endometriosis   . History of panic attacks   . Hyperlipidemia   . Osteopenia   . Pelvic pain   . Sensation of pressure in bladder area   . Wears glasses     Past Surgical History:  Procedure Laterality Date  . BUNIONECTOMY Left 2004 approx  . CARPAL TUNNEL RELEASE Bilateral right 2014;  left 2007  . COLONOSCOPY W/ POLYPECTOMY  09/24/2009  . CYSTO WITH HYDRODISTENSION [name] 10/15/2016   Procedure: CYSTOSCOPY/HYDRODISTENSION AND MARCAINE INSTILLATION;  Surgeon: Rana Snare, MD;  Location: [facility];  Service: Urology;  Laterality: [name];  . CYSTO/  HYDRODISTENTION/  INSTILLATION THERAPY  1997 approx  . HYSTEROSCOPY W/D&C  11/01/2004   POLYPECTOMY  . IR GASTROSTOMY TUBE REMOVAL  03/20/2017  . IR GENERIC HISTORICAL  01/26/2017   IR GASTROSTOMY TUBE MOD SED 01/26/2017 Sandi Mariscal, MD [facility]-INTER[facility] RAD  . LAPAROSCOPIC VAGINAL HYSTERECTOMY WITH SALPINGO OOPHORECTOMY Bilateral  04/20/2012  . TUBAL LIGATION  yrs ago    There were no vitals filed for this visit.      Subjective Assessment - 08/04/17 1108    Subjective  Pt reports she can reach into cabinets with both hands now   Pertinent History hx of back pain (went to pain management center)   Currently in Pain? No/denies            Treatment: supine bilateral shoulder flexion with 5 lbs ball,  Prone, scapular stabilization exercise for shoulder flexion and rowing with RUE off edge of mat 10 reps each with  2 LBS weight(repeated with LUE) Quadraped for cat/ cow positions, x 10 reps, then rocking forwards and backwards in quadraped for scapular stability Supine shoulder circles  With bilateral UE's each direction, and external rotation with each UE holding a 3 lbs weight, min v.c. Functional mid range reaching with RUE and high range reaching with LUE min v.c Arm bike x 6 mins level 5 for conditioning                    OT Short Term Goals - 06/30/17 1202      OT SHORT TERM GOAL #8   Title Pt will demonstrate RUE shoulder flexion to retrieve a lightweight object at 60 with only minimal compensations/ winging.   Status Achieved  grossly 60*  OT SHORT TERM GOAL  #9   TITLE Pt will demonstrate ability to retrieve a lightweight object at 110 shoulder flexion with LUE without reports of pain.   Status Achieved  met no reports of pain      OT SHORT TERM GOAL  #10   TITLE I with updated HEP- due 08/14/17   Time 6   Period Weeks   Status New     OT SHORT TERM GOAL  #11   TITLE Pt will demonstrate improved bilateral UE strength and functional use as evidenced by drying and styling her hair with no more than 2 rest breaks.   Baseline required 4 rest breaks today   Time 6   Period Weeks   Status New     OT SHORT TERM GOAL  #12   TITLE Pt will demonstrate ability to retrieve a lightweight  object at 75* with RUE with only min compensations/ scapular winging   Baseline mod  compensations, mod winging when retrieving items greater than 60*   Time 6   Period Weeks   Status New     OT SHORT TERM GOAL  #13   TITLE Pt will report increased ease with opening containers and plastic bags.   Time 6   Period Weeks   Status New           OT Long Term Goals - 06/30/17 1205      OT LONG TERM GOAL #1   Status --     OT LONG TERM GOAL #4   Title Patient will utilize bilateral upper extremities to blow dry and style her hair with modified independence   Time 8   Period Weeks   Status Achieved  requires assistance for the back of her hair from husband     Long Term Additional Goals   Additional Long Term Goals Yes     OT LONG TERM GOAL #6   Title Patient will demonstrate sufficient strength for unilateral reach to retrieve a 3 lb object from overhead shelf while standing with right hand and left hand (not bilateral reach)   Time 8   Period Weeks   Status Partially Met  able to retrieve however pt demonstrates mod compensations with RUE     OT LONG TERM GOAL #7   Title Patient will demonstrate sufficient strength to get down to floor to clean tub, and get back up without assistance- LTG's due 06/27/17   Time 8   Period Weeks   Status Achieved  partially met     OT LONG TERM GOAL #8   Title Pt will demonstrate ability to retrieve a 3 lbs weight from overhead shelf with RUE with no more than min compensations.- due 09/27/17   Baseline mod compensations retrieving 3lbs weight from overhead shelf with RUE   Time 12   Period Weeks   Status On-going  moderate scapular winging, difficulty     OT LONG TERM GOAL  #9   Baseline Pt will resume performance of mod complex cooking and home management tasks at a modified independent level.   Time --   Period Weeks   Status Achieved     OT LONG TERM GOAL  #10   TITLE Pt will demonstrate ability to retrieve a lightweight object at 90* with LUE, with only min compensations, minimal scapular winging in prep for  functional use.   Time 12   Period Weeks   Status New     OT LONG TERM GOAL  #  11   TITLE Pt will demonstrate increased UE strength and endurance as evidenced by washing a load of dishes, including odd shaped items with no more than 1 rest break.   Baseline 4 rest breaks today   Time 12   Period Weeks   Status New               Plan - 08/04/17 1709    Clinical Impression Statement Pt demonstrates improving UE functional use. She continues to demonstrate some scapular weakness right UE.   Rehab Potential Good   OT Frequency 2x / week   OT Duration 12 weeks   OT Treatment/Interventions Self-care/ADL training;Electrical Stimulation;Therapeutic exercise;Neuromuscular education;Energy conservation;Manual Therapy;Functional Mobility Training;DME and/or AE instruction;Visual/perceptual remediation/compensation;Cognitive remediation/compensation;Therapeutic activities;Balance training;Patient/family education;Therapeutic exercises;Passive range of motion   Plan functional reach, strengthening UE's   OT Home Exercise Plan Added to HEP - shoulder strengthening, floor exxercises for scapular stability   Consulted and Agree with Plan of Care Patient;Family member/caregiver      Patient will benefit from skilled therapeutic intervention in order to improve the following deficits and impairments:  Decreased coordination, Decreased range of motion, Decreased endurance, Decreased safety awareness, Decreased balance, Decreased cognition, Decreased strength, Impaired perceived functional ability, Impaired vision/preception, Impaired UE functional use, Impaired tone, Pain, Impaired sensation, Improper body mechanics, Decreased activity tolerance  Visit Diagnosis: Muscle weakness (generalized)  Other disturbances of skin sensation  Other lack of coordination  Acute pain of left shoulder    Problem List Patient Active Problem List   Diagnosis Date Noted  . Bilateral rotator cuff syndrome  04/22/2017  . Acute blood loss anemia   . Physical deconditioning   . Tracheostomy status ([place])   . Hypoxemia   . Dysphagia   . Numbness and tingling   . S/P percutaneous endoscopic gastrostomy (PEG) tube placement ([place])   . Bradycardia   . Left corneal abrasion   . Abnormal chest x-ray   . Acute respiratory failure with hypoxia ([place])   . Atrial fibrillation with RVR ([place])   . Dysphagia, pharyngoesophageal phase   . History of ETT   . Encounter for central line placement   . Encounter for feeding tube placement   . Weakness of right lower extremity   . SOB (shortness of breath)   . Pneumonia   . Pressure injury of skin 01/16/2017  . Acute respiratory failure ([place])   . Hyponatremia with decreased serum osmolality   . Hyponatremia   . Quadriplegia and quadriparesis ([place]) 01/14/2017  . GBS (Guillain Barre syndrome) ([place]) 01/13/2017  . Weakness 01/12/2017  . Hypokalemia 01/12/2017  . Endometriosis 12/05/2013  . Chronic low back pain 08/19/2012  . HYPERLIPIDEMIA 12/10/2010  . COLONIC POLYPS, HX OF 12/13/2009  . HYPERGLYCEMIA, FASTING 11/22/2008  . Depression 02/07/2008  . ANOSMIA 02/07/2008  . HEADACHE, CHRONIC 02/07/2008  . MITRAL VALVE PROLAPSE 10/05/2007  . OSTEOPENIA 10/05/2007    Jasmeet Gehl 08/04/2017, 5:11 PM Theone Murdoch, OTR/L [phone] Phone: [phone] 5:12 PM 08/04/17 [facility] [address] [place], [place], [address] Phone: [phone]   Fax:  [phone]  Name: Joanne Morrison MRN: [id number] Date of Birth: 08/28/1951

## 2017-08-06 ENCOUNTER — Encounter: Payer: BLUE CROSS/BLUE SHIELD | Admitting: Occupational Therapy

## 2017-08-11 ENCOUNTER — Ambulatory Visit: Payer: BLUE CROSS/BLUE SHIELD | Admitting: Occupational Therapy

## 2017-08-11 DIAGNOSIS — M6281 Muscle weakness (generalized): Secondary | ICD-10-CM | POA: Diagnosis not present

## 2017-08-11 DIAGNOSIS — R278 Other lack of coordination: Secondary | ICD-10-CM

## 2017-08-11 DIAGNOSIS — R208 Other disturbances of skin sensation: Secondary | ICD-10-CM

## 2017-08-11 NOTE — Therapy (Signed)
[facility] [address] [place], [place], [address] Phone: [phone]   Fax:  [phone]  Occupational Therapy Treatment  Patient Details  Name: Joanne Morrison MRN: [id number] Date of Birth: [birth year] Referring Provider: Dr Naaman Plummer  Encounter Date: 08/11/2017      OT End of Session - 08/11/17 1413    Visit Number 50   Number of Visits 60   Date for OT Re-Evaluation 09/30/17   Authorization Type BCBS  no visit limit   Authorization - Visit Number 16   OT Start Time 1103   OT Stop Time 1145   OT Time Calculation (min) 42 min      Past Medical History:  Diagnosis Date  . Anxiety   . Arthritis    back  . Chronic cystitis   . Chronic low back pain   . Depression   . Diverticulosis of colon   . Hemorrhoids   . History of colon polyps    09-24-2009  rectal hyperplastic polyp/   and 2016 non-cancerous  . History of endometriosis   . History of panic attacks   . Hyperlipidemia   . Osteopenia   . Pelvic pain   . Sensation of pressure in bladder area   . Wears glasses     Past Surgical History:  Procedure Laterality Date  . BUNIONECTOMY Left 2004 approx  . CARPAL TUNNEL RELEASE Bilateral right 2014;  left 2007  . COLONOSCOPY W/ POLYPECTOMY  09/24/2009  . CYSTO WITH HYDRODISTENSION [name] 10/15/2016   Procedure: CYSTOSCOPY/HYDRODISTENSION AND MARCAINE INSTILLATION;  Surgeon: Rana Snare, MD;  Location: [facility];  Service: Urology;  Laterality: [name];  . CYSTO/  HYDRODISTENTION/  INSTILLATION THERAPY  1997 approx  . HYSTEROSCOPY W/D&C  11/01/2004   POLYPECTOMY  . IR GASTROSTOMY TUBE REMOVAL  03/20/2017  . IR GENERIC HISTORICAL  01/26/2017   IR GASTROSTOMY TUBE MOD SED 01/26/2017 Sandi Mariscal, MD [facility]  . LAPAROSCOPIC VAGINAL HYSTERECTOMY WITH SALPINGO OOPHORECTOMY Bilateral 04/20/2012  . TUBAL LIGATION  yrs ago    There were no vitals filed for this visit.      Subjective Assessment  - 08/11/17 1411    Subjective  Pt requests to take a break from therapy   Pertinent History hx of back pain (went to pain management center)   Currently in Pain? No/denies        Treatment: checked progress towards goals. Pt has made excellent overall progress. Pt to be placed on hold then pt to return in 4 weeks for updated HEP/POC PRN.   Prone, scapular stabilization exercise for shoulder flexion and rowing with RUE off edge of mat 10 reps each with  3 LBS weight(repeated with LUE) Quadraped rocking forwards and backwards in quadraped for scapular stability, prone lifting chest while weightbearing through elbows Supine shoulder circles  With bilateral UE's each direction, supine unilateral shoulder overhead press, and external rotation with each UE holding a 3 lbs weight, min v.c. Therapist recommends pt goes to the gym to perform walking, and nustep. Pt was encouraged to continue her supine weighted exercises( shoulder circles, external rotation with 3 lbs, prone shoulder ext and rowing with 3 lbs at gym). Pt was also encouraged to perform wall pushups, rocking forwards and back in quadraped, cat/ cow. Pt verbalizes understanding of HEP.                     OT Short Term Goals - 08/11/17 0001  OT SHORT TERM GOAL #8   Title Pt will demonstrate RUE shoulder flexion to retrieve a lightweight object at 60 with only minimal compensations/ winging.   Status Achieved  grossly 60*     OT SHORT TERM GOAL  #9   TITLE Pt will demonstrate ability to retrieve a lightweight object at 110 shoulder flexion with LUE without reports of pain.   Status Achieved  met no reports of pain      OT SHORT TERM GOAL  #10   TITLE I with updated HEP- due 08/14/17   Time 6   Period Weeks   Status Achieved  Pt verbalizes understanding of HEP     OT SHORT TERM GOAL  #11   TITLE Pt will demonstrate improved bilateral UE strength and functional use as evidenced by drying and styling her hair  with no more than 2 rest breaks.   Baseline required 4 rest breaks today   Time 6   Period Weeks   Status Achieved     OT SHORT TERM GOAL  #12   TITLE Pt will demonstrate ability to retrieve a lightweight  object at 75* with RUE with only min compensations/ scapular winging   Baseline mod compensations, mod winging when retrieving items greater than 60*   Time 6   Period Weeks   Status Achieved     OT SHORT TERM GOAL  #13   TITLE Pt will report increased ease with opening containers and plastic bags.  75 with min compensations   Time 6   Period Weeks   Status Achieved           OT Long Term Goals - 08/11/17 1128      OT LONG TERM GOAL #8   Title Pt will demonstrate ability to retrieve a 3 lbs weight from overhead shelf with RUE with no more than min compensations.- due 09/27/17   Status On-going  75 with lightweight object     OT LONG TERM GOAL  #9   Baseline Pt will resume performance of mod complex cooking and home management tasks at a modified independent level.   Status Achieved     OT LONG TERM GOAL  #10   TITLE Pt will demonstrate ability to retrieve a lightweight object at 90* with LUE, with only min compensations, minimal scapular winging in prep for functional use.   Time 12   Period Weeks   Status Achieved  135* with no significant compensations     OT LONG TERM GOAL  #11   TITLE Pt will demonstrate increased UE strength and endurance as evidenced by washing a load of dishes, including odd shaped items with no more than 1 rest break.   Time 12   Period Weeks   Status Achieved               Plan - 08/11/17 1415    Clinical Impression Statement Pt demonstrates improved bilateral UE functional use. Pt requests to take a month off from therapy then to return at the end of September.   Rehab Potential Good   OT Frequency 2x / week   OT Duration 12 weeks   OT Treatment/Interventions Self-care/ADL training;Electrical Stimulation;Therapeutic  exercise;Neuromuscular education;Energy conservation;Manual Therapy;Functional Mobility Training;DME and/or AE instruction;Visual/perceptual remediation/compensation;Cognitive remediation/compensation;Therapeutic activities;Balance training;Patient/family education;Therapeutic exercises;Passive range of motion   Plan Pt placed on hold x 1 month to allow for continued healing time, pt to return at end of Sept. for updated HEP/ POC if appropriate   OT Home  Exercise Plan Added to HEP - shoulder strengthening, floor exxercises for scapular stability   Consulted and Agree with Plan of Care Patient;Family member/caregiver   Family Member Consulted husband      Patient will benefit from skilled therapeutic intervention in order to improve the following deficits and impairments:  Decreased coordination, Decreased range of motion, Decreased endurance, Decreased safety awareness, Decreased balance, Decreased cognition, Decreased strength, Impaired perceived functional ability, Impaired vision/preception, Impaired UE functional use, Impaired tone, Pain, Impaired sensation, Improper body mechanics, Decreased activity tolerance  Visit Diagnosis: Muscle weakness (generalized)  Other disturbances of skin sensation  Other lack of coordination    Problem List Patient Active Problem List   Diagnosis Date Noted  . Bilateral rotator cuff syndrome 04/22/2017  . Acute blood loss anemia   . Physical deconditioning   . Tracheostomy status ([place])   . Hypoxemia   . Dysphagia   . Numbness and tingling   . S/P percutaneous endoscopic gastrostomy (PEG) tube placement ([place])   . Bradycardia   . Left corneal abrasion   . Abnormal chest x-ray   . Acute respiratory failure with hypoxia ([place])   . Atrial fibrillation with RVR ([place])   . Dysphagia, pharyngoesophageal phase   . History of ETT   . Encounter for central line placement   . Encounter for feeding tube placement   . Weakness of right lower extremity   .  SOB (shortness of breath)   . Pneumonia   . Pressure injury of skin 01/16/2017  . Acute respiratory failure ([place])   . Hyponatremia with decreased serum osmolality   . Hyponatremia   . Quadriplegia and quadriparesis ([place]) 01/14/2017  . GBS (Guillain Barre syndrome) ([place]) 01/13/2017  . Weakness 01/12/2017  . Hypokalemia 01/12/2017  . Endometriosis 12/05/2013  . Chronic low back pain 08/19/2012  . HYPERLIPIDEMIA 12/10/2010  . COLONIC POLYPS, HX OF 12/13/2009  . HYPERGLYCEMIA, FASTING 11/22/2008  . Depression 02/07/2008  . ANOSMIA 02/07/2008  . HEADACHE, CHRONIC 02/07/2008  . MITRAL VALVE PROLAPSE 10/05/2007  . OSTEOPENIA 10/05/2007    RINE,KATHRYN 08/11/2017, 2:18 PM Theone Murdoch, OTR/L [phone] Phone: [phone] 2:25 PM 08/11/17 [facility] [address] [place], [place], [address] Phone: [phone]   Fax:  [phone]  Name: Joanne [facility] MRN: [id number] Date of Birth: June 06, 1951

## 2017-08-13 ENCOUNTER — Encounter: Payer: BLUE CROSS/BLUE SHIELD | Admitting: Occupational Therapy

## 2017-08-18 ENCOUNTER — Encounter: Payer: BLUE CROSS/BLUE SHIELD | Admitting: Occupational Therapy

## 2017-08-25 ENCOUNTER — Encounter: Payer: BLUE CROSS/BLUE SHIELD | Admitting: Occupational Therapy

## 2017-09-01 ENCOUNTER — Encounter: Payer: BLUE CROSS/BLUE SHIELD | Admitting: Occupational Therapy

## 2017-09-08 ENCOUNTER — Ambulatory Visit: Payer: BLUE CROSS/BLUE SHIELD | Attending: Physical Medicine & Rehabilitation | Admitting: Occupational Therapy

## 2017-09-08 DIAGNOSIS — M6281 Muscle weakness (generalized): Secondary | ICD-10-CM | POA: Insufficient documentation

## 2017-09-08 DIAGNOSIS — R208 Other disturbances of skin sensation: Secondary | ICD-10-CM | POA: Diagnosis present

## 2017-09-08 DIAGNOSIS — M25512 Pain in left shoulder: Secondary | ICD-10-CM | POA: Diagnosis present

## 2017-09-08 DIAGNOSIS — R278 Other lack of coordination: Secondary | ICD-10-CM | POA: Diagnosis present

## 2017-09-08 NOTE — Patient Instructions (Signed)
  Strengthening: Resisted Flexion   Hold tubing with __one___ arm(s) at side. Pull forward and up. Move shoulder through pain-free range of motion. Repeat __10__ times per set.  Do _1-2_ sessions per day , every other day   Strengthening: Resisted Extension   Hold tubing in __both ___ hand(s), arm forward. Pull arm back, elbow straight. Repeat _10___ times per set. Do _1-2___ sessions per day, every other day. Repeat with elbows bent      Elbow Flexion: Resisted   With tubing held in __one____ hand(s) and other end secured under foot, curl arm up as far as possible. Repeat _10___ times per set. Do _1-2___ sessions per day, every other day.    Elbow Extension: Resisted   Sit in chair with resistive band holding  with other hand and __one_____ elbow bent. Straighten elbow. Repeat _10___ times per set.  Do _1-2___ sessions per day, every other day.   Copyright  VHI. All rights reserved.

## 2017-09-09 NOTE — Therapy (Signed)
[facility] [address] [place], [place], [address] Phone: [phone]   Fax:  [phone]  Occupational Therapy Treatment  Patient Details  Name: Joanne Morrison MRN: [id number] Date of Birth: 10-30-1951 Referring Provider: Dr Naaman Plummer  Encounter Date: 09/08/2017      OT End of Session - 09/09/17 0911    Visit Number 58   Number of Visits 60   Authorization Type [employer]  no visit limit   Authorization - Visit Number 52   OT Start Time 2035   OT Stop Time 1145   OT Time Calculation (min) 40 min   Activity Tolerance Patient tolerated treatment well   Behavior During Therapy [facility] for tasks assessed/performed      Past Medical History:  Diagnosis Date  . Anxiety   . Arthritis    back  . Chronic cystitis   . Chronic low back pain   . Depression   . Diverticulosis of colon   . Hemorrhoids   . History of colon polyps    09-24-2009  rectal hyperplastic polyp/   and 2016 non-cancerous  . History of endometriosis   . History of panic attacks   . Hyperlipidemia   . Osteopenia   . Pelvic pain   . Sensation of pressure in bladder area   . Wears glasses     Past Surgical History:  Procedure Laterality Date  . BUNIONECTOMY Left 2004 approx  . CARPAL TUNNEL RELEASE Bilateral right 2014;  left 2007  . COLONOSCOPY W/ POLYPECTOMY  09/24/2009  . CYSTO WITH HYDRODISTENSION [name] 10/15/2016   Procedure: CYSTOSCOPY/HYDRODISTENSION AND MARCAINE INSTILLATION;  Surgeon: Rana Snare, MD;  Location: [facility];  Service: Urology;  Laterality: [name];  . CYSTO/  HYDRODISTENTION/  INSTILLATION THERAPY  1997 approx  . HYSTEROSCOPY W/D&C  11/01/2004   POLYPECTOMY  . IR GASTROSTOMY TUBE REMOVAL  03/20/2017  . IR GENERIC HISTORICAL  01/26/2017   IR GASTROSTOMY TUBE MOD SED 01/26/2017 Sandi Mariscal, MD [facility]  . LAPAROSCOPIC VAGINAL HYSTERECTOMY WITH SALPINGO OOPHORECTOMY Bilateral 04/20/2012  . TUBAL LIGATION  yrs ago     There were no vitals filed for this visit.      Subjective Assessment - 09/09/17 0910    Subjective  Pt reports thumb pain   Pertinent History hx of back pain (went to pain management center)   Currently in Pain? Yes   Pain Score 3    Pain Location --  thumb   Pain Descriptors / Indicators Aching   Pain Type Chronic pain   Pain Onset 1 to 4 weeks ago   Pain Frequency Intermittent   Aggravating Factors  malpositioning   Pain Relieving Factors respositioning                              OT Education - 09/09/17 0918    Education provided Yes   Education Details theraband HEP- see pt instructions, safe use of exercise machine for rowing in both directions with 10 lbs weight using bilateral UE's   Person(s) Educated Patient   Methods Explanation;Demonstration;Verbal cues   Comprehension Verbalized understanding;Returned demonstration;Verbal cues required          OT Short Term Goals - 08/11/17 0001      OT SHORT TERM GOAL #8   Title Pt will demonstrate RUE shoulder flexion to retrieve a lightweight object at 60 with only minimal compensations/ winging.   Status Achieved  grossly 60*  OT SHORT TERM GOAL  #9   TITLE Pt will demonstrate ability to retrieve a lightweight object at 110 shoulder flexion with LUE without reports of pain.   Status Achieved  met no reports of pain      OT SHORT TERM GOAL  #10   TITLE I with updated HEP- due 08/14/17   Time 6   Period Weeks   Status Achieved  Pt verbalizes understanding of HEP     OT SHORT TERM GOAL  #11   TITLE Pt will demonstrate improved bilateral UE strength and functional use as evidenced by drying and styling her hair with no more than 2 rest breaks.   Baseline required 4 rest breaks today   Time 6   Period Weeks   Status Achieved     OT SHORT TERM GOAL  #12   TITLE Pt will demonstrate ability to retrieve a lightweight  object at 75* with RUE with only min compensations/ scapular winging    Baseline mod compensations, mod winging when retrieving items greater than 60*   Time 6   Period Weeks   Status Achieved     OT SHORT TERM GOAL  #13   TITLE Pt will report increased ease with opening containers and plastic bags.  75 with min compensations   Time 6   Period Weeks   Status Achieved           OT Long Term Goals - 09/09/17 2263      Long Term Additional Goals   Additional Long Term Goals Yes     OT LONG TERM GOAL #8   Title Pt will demonstrate ability to retrieve a 3 lbs weight from overhead shelf with RUE with no more than min compensations.- due 09/27/17   Status Achieved     OT LONG TERM GOAL  #9   Baseline Pt will resume performance of mod complex cooking and home management tasks at a modified independent level.   Status Achieved     OT LONG TERM GOAL  #10   TITLE Pt will demonstrate ability to retrieve a lightweight object at 90* with LUE, with only min compensations, minimal scapular winging in prep for functional use.   Time 12   Period Weeks   Status Achieved  135* with no significant compensations     OT LONG TERM GOAL  #11   TITLE Pt will demonstrate increased UE strength and endurance as evidenced by washing a load of dishes, including odd shaped items with no more than 1 rest break.   Time 12   Period Weeks   Status Achieved     OT LONG TERM GOAL  #12   TITLE I with updated HEP   Time 6   Period Weeks   Status New   Target Date 10/24/17               Plan - 09/09/17 3354    Clinical Impression Statement Pt returns to therapy after approximately 1 month break. Pt demonstrates excellent gains in strength.   Rehab Potential Good   OT Frequency 1x / week   OT Duration 6 weeks   OT Treatment/Interventions Self-care/ADL training;Electrical Stimulation;Therapeutic exercise;Neuromuscular education;Energy conservation;Manual Therapy;Functional Mobility Training;DME and/or AE instruction;Visual/perceptual  remediation/compensation;Cognitive remediation/compensation;Therapeutic activities;Balance training;Patient/family education;Therapeutic exercises;Passive range of motion   Plan Pt would like to work at home on HEP for the next month and then return in approximately 1 month for an updated HEP.   OT Home Exercise Plan theraband HEP  Consulted and Agree with Plan of Care Patient;Family member/caregiver   Family Member Consulted husband      Patient will benefit from skilled therapeutic intervention in order to improve the following deficits and impairments:  Decreased coordination, Decreased range of motion, Decreased endurance, Decreased safety awareness, Decreased balance, Decreased cognition, Decreased strength, Impaired perceived functional ability, Impaired vision/preception, Impaired UE functional use, Impaired tone, Pain, Impaired sensation, Improper body mechanics, Decreased activity tolerance  Visit Diagnosis: Muscle weakness (generalized)  Other disturbances of skin sensation  Other lack of coordination  Acute pain of left shoulder    Problem List Patient Active Problem List   Diagnosis Date Noted  . Bilateral rotator cuff syndrome 04/22/2017  . Acute blood loss anemia   . Physical deconditioning   . Tracheostomy status ([place])   . Hypoxemia   . Dysphagia   . Numbness and tingling   . S/P percutaneous endoscopic gastrostomy (PEG) tube placement ([place])   . Bradycardia   . Left corneal abrasion   . Abnormal chest x-ray   . Acute respiratory failure with hypoxia ([place])   . Atrial fibrillation with RVR ([place])   . Dysphagia, pharyngoesophageal phase   . History of ETT   . Encounter for central line placement   . Encounter for feeding tube placement   . Weakness of right lower extremity   . SOB (shortness of breath)   . Pneumonia   . Pressure injury of skin 01/16/2017  . Acute respiratory failure ([place])   . Hyponatremia with decreased serum osmolality   . Hyponatremia   .  Quadriplegia and quadriparesis ([place]) 01/14/2017  . GBS (Guillain Barre syndrome) ([place]) 01/13/2017  . Weakness 01/12/2017  . Hypokalemia 01/12/2017  . Endometriosis 12/05/2013  . Chronic low back pain 08/19/2012  . HYPERLIPIDEMIA 12/10/2010  . COLONIC POLYPS, HX OF 12/13/2009  . HYPERGLYCEMIA, FASTING 11/22/2008  . Depression 02/07/2008  . ANOSMIA 02/07/2008  . HEADACHE, CHRONIC 02/07/2008  . MITRAL VALVE PROLAPSE 10/05/2007  . OSTEOPENIA 10/05/2007    Myrth Dahan 09/09/2017, 9:27 AM Theone Murdoch, OTR/L [phone] Phone: [phone] 9:28 AM 09/09/17 [facility] [address] [place], [place], [address] Phone: [phone]   Fax:  [phone]  Name: Nari Vannatter MRN: [id number] Date of Birth: 1951-07-27

## 2017-09-18 ENCOUNTER — Encounter: Payer: Self-pay | Admitting: Neurology

## 2017-09-18 ENCOUNTER — Ambulatory Visit (INDEPENDENT_AMBULATORY_CARE_PROVIDER_SITE_OTHER): Payer: BLUE CROSS/BLUE SHIELD | Admitting: Neurology

## 2017-09-18 VITALS — BP 166/87 | HR 62 | Ht 62.0 in | Wt 149.5 lb

## 2017-09-18 DIAGNOSIS — R531 Weakness: Secondary | ICD-10-CM

## 2017-09-18 DIAGNOSIS — G610 Guillain-Barre syndrome: Secondary | ICD-10-CM

## 2017-09-18 DIAGNOSIS — M65341 Trigger finger, right ring finger: Secondary | ICD-10-CM

## 2017-09-18 NOTE — Progress Notes (Signed)
Reason for visit: Guillain-Barr syndrome  Joanne Morrison is an 66 y.o. female  History of present illness:  Joanne Morrison is a 66 year old white female with a history of Guillain-Barr syndrome that began relatively suddenly on 12/31/2016. The patient required intubation and a prolonged hospital stay, but she has recovered rapidly from this. She is still having some mild weakness in the left arm, she still gets some therapy for this. She has some residual left facial weakness, difficulty closing the left eye. The patient is using a weight on the left eyelid. She has some occasional cramping of the hands in the evening hours, she has developed trigger fingers for the right thumb and right ring finger. She may have some slight tingling in the toes at night. She has regained good balance, she has not had any falls, she has good energy level, she remains active. She returns for an evaluation.  Past Medical History:  Diagnosis Date  . Anxiety   . Arthritis    back  . Chronic cystitis   . Chronic low back pain   . Depression   . Diverticulosis of colon   . Hemorrhoids   . History of colon polyps    09-24-2009  rectal hyperplastic polyp/   and 2016 non-cancerous  . History of endometriosis   . History of panic attacks   . Hyperlipidemia   . Osteopenia   . Pelvic pain   . Sensation of pressure in bladder area   . Wears glasses     Past Surgical History:  Procedure Laterality Date  . BUNIONECTOMY Left 2004 approx  . CARPAL TUNNEL RELEASE Bilateral right 2014;  left 2007  . COLONOSCOPY W/ POLYPECTOMY  09/24/2009  . CYSTO WITH HYDRODISTENSION [name] 10/15/2016   Procedure: CYSTOSCOPY/HYDRODISTENSION AND MARCAINE INSTILLATION;  Surgeon: Rana Snare, MD;  Location: [facility];  Service: Urology;  Laterality: [name];  . CYSTO/  HYDRODISTENTION/  INSTILLATION THERAPY  1997 approx  . HYSTEROSCOPY W/D&C  11/01/2004   POLYPECTOMY  . IR GASTROSTOMY TUBE REMOVAL  03/20/2017  . IR  GENERIC HISTORICAL  01/26/2017   IR GASTROSTOMY TUBE MOD SED 01/26/2017 Sandi Mariscal, MD [facility]  . LAPAROSCOPIC VAGINAL HYSTERECTOMY WITH SALPINGO OOPHORECTOMY Bilateral 04/20/2012  . TUBAL LIGATION  yrs ago    Family History  Problem Relation Age of Onset  . Hypertension Mother   . Dementia Mother   . Lung cancer Mother        smoker  . Osteoporosis Mother   . Heart attack Father 81  . Hypertension Brother   . Pulmonary embolism Brother 60       ?etilology  . Diabetes Maternal Aunt   . Cancer Maternal Uncle        RENAL  . Heart attack Maternal Grandmother 86  . Lung cancer Maternal Grandfather        Ecologist  . Anesthesia problems Neg Hx   . Stroke Neg Hx     Social history:  reports that she has never smoked. She has never used smokeless tobacco. She reports that she does not drink alcohol or use drugs.    Allergies  Allergen Reactions  . Olanzapine Other (See Comments)    STIFF JOINTS, EXTREMITY WEAKNESS, MEMORY LOSS, LOSS OF BALANCE  . Gabapentin     Causes confusion and extreme drowsiness   . Sumatriptan Other (See Comments)    REACTION: altered mental status  . Graciella Freer D-S] Other (See Comments)    lethargic  Medications:  Prior to Admission medications   Medication Sig Start Date End Date Taking? Authorizing Provider  diazepam (VALIUM) 10 MG tablet Take 10 mg by mouth 2 (two) times daily. 12/16/16  Yes [provider]  erythromycin ophthalmic ointment Place 1 application into both eyes every 4 (four) hours. 02/20/17  Yes Angiulli, Lavon Paganini, PA-C  sertraline (ZOLOFT) 50 MG tablet Take 1 tablet (50 mg total) by mouth at bedtime. 04/15/17  Yes Meredith Staggers, MD  losartan (COZAAR) 25 MG tablet Take 25 mg by mouth daily.    [provider]    ROS:  Out of a complete 14 system review of symptoms, the patient complains only of the following symptoms, and all other reviewed systems are negative.  Cramping of the hands Trigger  finger, right hand  Blood pressure (!) 166/87, pulse 62, height 5\' 2"  (1.575 m), weight 149 lb 8 oz (67.8 kg).  Physical Exam  General: The patient is alert and cooperative at the time of the examination.  Skin: No significant peripheral edema is noted.   Neurologic Exam  Mental status: The patient is alert and oriented x 3 at the time of the examination. The patient has apparent normal recent and remote memory, with an apparently normal attention span and concentration ability.   Cranial nerves: Facial symmetry is not present. There is a depression of the right nasolabial fold, the patient has some asymmetry with smile, less on the left. Speech is normal, no aphasia or dysarthria is noted. Extraocular movements are full. Visual fields are full.  Motor: The patient has good strength in all 4 extremities, with exception that there is slight weakness with external rotation of the left arm.  Sensory examination: Soft touch sensation is symmetric on the face, arms, and legs.  Coordination: The patient has good finger-nose-finger and heel-to-shin bilaterally.  Gait and station: The patient has a normal gait. Tandem gait is normal. Romberg is negative. No drift is seen. The patient is able to walk on the toes bilaterally, she has slight difficulty walking on heels on the right, can do this on the left.  Reflexes: Deep tendon reflexes are symmetric, but are depressed absent bilaterally.   Assessment/Plan:  1. Guillain-Barr syndrome  2. Trigger fingers, right thumb and right ring finger  The patient has made a good recovery, she continues to improve slowly with the arm strength and facial strength. She is not having significant discomfort. She has developed trigger fingers in the right thumb and right ring finger, if these symptoms persist, a hand surgeon referral may be indicated. Otherwise, the patient will follow-up through this office on an as-needed basis.  Jill Alexanders  MD 09/18/2017 8:06 AM  [facility] [address] [place], [place] [address]  Phone [phone] Fax [phone]

## 2017-09-22 ENCOUNTER — Encounter: Payer: Self-pay | Admitting: Physical Medicine & Rehabilitation

## 2017-09-22 ENCOUNTER — Encounter
Payer: BLUE CROSS/BLUE SHIELD | Attending: Physical Medicine & Rehabilitation | Admitting: Physical Medicine & Rehabilitation

## 2017-09-22 VITALS — BP 148/86 | HR 75

## 2017-09-22 DIAGNOSIS — E785 Hyperlipidemia, unspecified: Secondary | ICD-10-CM | POA: Insufficient documentation

## 2017-09-22 DIAGNOSIS — F419 Anxiety disorder, unspecified: Secondary | ICD-10-CM | POA: Insufficient documentation

## 2017-09-22 DIAGNOSIS — Z8262 Family history of osteoporosis: Secondary | ICD-10-CM | POA: Diagnosis not present

## 2017-09-22 DIAGNOSIS — R001 Bradycardia, unspecified: Secondary | ICD-10-CM | POA: Diagnosis not present

## 2017-09-22 DIAGNOSIS — M6530 Trigger finger, unspecified finger: Secondary | ICD-10-CM

## 2017-09-22 DIAGNOSIS — L299 Pruritus, unspecified: Secondary | ICD-10-CM | POA: Insufficient documentation

## 2017-09-22 DIAGNOSIS — Z79899 Other long term (current) drug therapy: Secondary | ICD-10-CM | POA: Diagnosis not present

## 2017-09-22 DIAGNOSIS — F329 Major depressive disorder, single episode, unspecified: Secondary | ICD-10-CM | POA: Diagnosis not present

## 2017-09-22 DIAGNOSIS — G610 Guillain-Barre syndrome: Secondary | ICD-10-CM

## 2017-09-22 DIAGNOSIS — G8250 Quadriplegia, unspecified: Secondary | ICD-10-CM

## 2017-09-22 DIAGNOSIS — Z8249 Family history of ischemic heart disease and other diseases of the circulatory system: Secondary | ICD-10-CM | POA: Insufficient documentation

## 2017-09-22 DIAGNOSIS — Z931 Gastrostomy status: Secondary | ICD-10-CM | POA: Diagnosis not present

## 2017-09-22 DIAGNOSIS — M545 Low back pain: Secondary | ICD-10-CM | POA: Insufficient documentation

## 2017-09-22 DIAGNOSIS — I4891 Unspecified atrial fibrillation: Secondary | ICD-10-CM | POA: Insufficient documentation

## 2017-09-22 DIAGNOSIS — M8580 Other specified disorders of bone density and structure, unspecified site: Secondary | ICD-10-CM | POA: Diagnosis not present

## 2017-09-22 DIAGNOSIS — H02206 Unspecified lagophthalmos left eye, unspecified eyelid: Secondary | ICD-10-CM | POA: Diagnosis not present

## 2017-09-22 DIAGNOSIS — G8929 Other chronic pain: Secondary | ICD-10-CM | POA: Insufficient documentation

## 2017-09-22 MED ORDER — DICLOFENAC SODIUM 1 % TD GEL: 1.0000 "application " | Freq: Three times a day (TID) | TRANSDERMAL | 4 refills | Status: DC

## 2017-09-22 MED ORDER — BACLOFEN 10 MG PO TABS: 5.0000 mg | ORAL_TABLET | Freq: Every day | ORAL | 3 refills | Status: DC

## 2017-09-22 NOTE — Progress Notes (Signed)
Subjective:    Patient ID: Joanne Morrison, female    DOB: Aug 12, 1951, 66 y.o.   MRN: [id number]  HPI   Joanne Morrison is here in follow up of her GBS. She has continued to progress from a function and mobility standpoint. She hasn't had any falls. She is still wearing a weight on her left eye and is being followed by optho.   She has had issues with her hands being clawed and she has to physically open them at times. She has noticed that her right index finger and thumb is tight, sometimes locking.   She reports spasms at night which affect sleep. Her arms sometimes "jerk" and move. She notes improvement in strength though  She continues with the weight on her left eye. She follows up with optho for this. She is hoping to be weaned from the weight eventually.     Pain Inventory Average Pain 4 Pain Right Now 0 My pain is intermittent and when trying tp grasp things  In the last 24 hours, has pain interfered with the following? General activity 5 Relation with others 0 Enjoyment of life 5 What TIME of day is your pain at its worst? all day Sleep (in general) Fair  Pain is worse with: some activites Pain improves with: nothing-manipulation Relief from Meds: 4  Mobility do you drive?  yes  Function retired  Neuro/Psych bladder control problems anxiety  Prior Studies Any changes since last visit?  no  Physicians involved in your care Any changes since last visit?  no   Family History  Problem Relation Age of Onset  . Hypertension Mother   . Dementia Mother   . Lung cancer Mother        smoker  . Osteoporosis Mother   . Heart attack Father 14  . Hypertension Brother   . Pulmonary embolism Brother 60       ?etilology  . Diabetes Maternal Aunt   . Cancer Maternal Uncle        RENAL  . Heart attack Maternal Grandmother 86  . Lung cancer Maternal Grandfather        Ecologist  . Anesthesia problems Neg Hx   . Stroke Neg Hx    Social History   Social History    . Marital status: Married    Spouse name: [name] Resides  . Number of children: 1  . Years of education: 58   Social History Main Topics  . Smoking status: Never Smoker  . Smokeless tobacco: Never Used  . Alcohol use No  . Drug use: No  . Sexual activity: Yes    Birth control/ protection: None   Other Topics Concern  . Not on file   Social History Narrative   Lives w/ husband   Caffeine use: 2 drinks per month    Right handed   Past Surgical History:  Procedure Laterality Date  . BUNIONECTOMY Left 2004 approx  . CARPAL TUNNEL RELEASE Bilateral right 2014;  left 2007  . COLONOSCOPY W/ POLYPECTOMY  09/24/2009  . CYSTO WITH HYDRODISTENSION [name] 10/15/2016   Procedure: CYSTOSCOPY/HYDRODISTENSION AND MARCAINE INSTILLATION;  Surgeon: Rana Snare, MD;  Location: [facility];  Service: Urology;  Laterality: [name];  . CYSTO/  HYDRODISTENTION/  INSTILLATION THERAPY  1997 approx  . HYSTEROSCOPY W/D&C  11/01/2004   POLYPECTOMY  . IR GASTROSTOMY TUBE REMOVAL  03/20/2017  . IR GENERIC HISTORICAL  01/26/2017   IR GASTROSTOMY TUBE MOD SED 01/26/2017 Sandi Mariscal, MD [facility]  .  LAPAROSCOPIC VAGINAL HYSTERECTOMY WITH SALPINGO OOPHORECTOMY Bilateral 04/20/2012  . TUBAL LIGATION  yrs ago   Past Medical History:  Diagnosis Date  . Anxiety   . Arthritis    back  . Chronic cystitis   . Chronic low back pain   . Depression   . Diverticulosis of colon   . Hemorrhoids   . History of colon polyps    09-24-2009  rectal hyperplastic polyp/   and 2016 non-cancerous  . History of endometriosis   . History of panic attacks   . Hyperlipidemia   . Osteopenia   . Pelvic pain   . Sensation of pressure in bladder area   . Wears glasses    There were no vitals taken for this visit.  Opioid Risk Score:   Fall Risk Score:  `1  Depression screen PHQ 2/9  Depression screen [facility] 2/9 03/04/2017 12/05/2013  Decreased Interest 3 0  Down, Depressed, Hopeless 3 0  PHQ - 2 Score 6 0  Altered  sleeping 2 -  Tired, decreased energy 3 -  Change in appetite 0 -  Feeling bad or failure about yourself  0 -  Trouble concentrating 3 -  Moving slowly or fidgety/restless 3 -  Suicidal thoughts 0 -  PHQ-9 Score 17 -  Difficult doing work/chores Extremely dIfficult -    Review of Systems  Constitutional: Positive for diaphoresis.  HENT: Negative.   Eyes: Negative.   Respiratory: Negative.   Cardiovascular: Negative.   Gastrointestinal: Negative.   Endocrine: Negative.   Genitourinary: Positive for difficulty urinating.  Musculoskeletal: Negative.   Skin: Negative.   Allergic/Immunologic: Negative.   Neurological: Negative.   Hematological: Negative.   Psychiatric/Behavioral: The patient is nervous/anxious.   All other systems reviewed and are negative.      Objective:   Physical Exam  Constitutional: She appears well-developed and well-nourished. NAD.  HENT: Normocephalic. Atraumatic.  Eyes: Left lid with attached weight. Sclera white.  Neck: trach scar Cardiovascular:  RRR Respiratory: CTA B GI: Soft. Bowel sounds are normal.  Musculoskeletal: mild IR/ER pain right upper ext. LUE with miimal pain Neurological: She is alert and oriented.  Motor:  LUE: 4+/5 proximally, 4+/5 distally  RUE: 4 to 4++/5. Still a little more weak proximally.  Decreased sensation over toes which is resolving  Mild left facial droop. Speech clear. Lid Closes with weight Normal gait.  Skin: Skin is warm and dry.  Psychiatric: Normal mood and behavior.      Assessment & Plan:  Medical Problem List and Plan: 1. 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  -continue with HEP, outpt therapies to completion 2. Neuropathic pain/spasms -continue outpt OT and HEP -baclofen 5-10 mg at night for spasms and sleep 3. Mood: Zoloft 50 mg daily.  3. Right handed trigger finger  -voltaren gel   -hand exercises were provided 4. 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 Underwent tarorhaphy 02/04/2017. Continue erythromycin ointment for now follow-up ophthalmology services as needed, -continue with plan per optho. I see progress there although lid still lags     25 minutes of face to face patient care time were spent during this visit. All questions were encouraged and answered. Greater than 50% of time during this encounter was spent counseling patient/family in regard to trigger finger management, pain control, GBS.

## 2017-09-22 NOTE — Patient Instructions (Signed)
PLEASE FEEL FREE TO CALL OUR OFFICE WITH ANY PROBLEMS OR QUESTIONS ([phone])      

## 2017-09-30 ENCOUNTER — Telehealth: Payer: Self-pay

## 2017-09-30 NOTE — Telephone Encounter (Signed)
Patient called stating that her hands are hurting and that the prescription Dr. Naaman Plummer gave her is working but for a refill she needs prior authorization (diclofenac) here is the number she left to call for authorization [phone].

## 2017-10-01 NOTE — Telephone Encounter (Signed)
Prior authorization for diclofenac sodium gel approved

## 2017-10-07 ENCOUNTER — Ambulatory Visit: Payer: BLUE CROSS/BLUE SHIELD | Attending: Physical Medicine & Rehabilitation | Admitting: Occupational Therapy

## 2017-10-07 DIAGNOSIS — R278 Other lack of coordination: Secondary | ICD-10-CM | POA: Diagnosis present

## 2017-10-07 DIAGNOSIS — R208 Other disturbances of skin sensation: Secondary | ICD-10-CM | POA: Diagnosis present

## 2017-10-07 DIAGNOSIS — M6281 Muscle weakness (generalized): Secondary | ICD-10-CM | POA: Diagnosis not present

## 2017-10-07 DIAGNOSIS — M25512 Pain in left shoulder: Secondary | ICD-10-CM | POA: Diagnosis present

## 2017-10-08 NOTE — Therapy (Signed)
[facility] [address] [place], [place], [address] Phone: [phone]   Fax:  [phone]  Occupational Therapy Treatment  Patient Details  Name: Joanne Morrison MRN: [id number] Date of Birth: 1951/03/20 Referring Provider: Dr Naaman Plummer  Encounter Date: 10/07/2017      OT End of Session - 10/08/17 1002    Visit Number 22   Number of Visits 44   Date for OT Re-Evaluation 09/30/17   Authorization Type [employer]  no visit limit   Authorization Time Period renewal sent on 09/09/17 for 4 visits x 6 weeks   Authorization - Visit Number 29   OT Start Time 1962   OT Stop Time 1145   OT Time Calculation (min) 40 min   Activity Tolerance Patient tolerated treatment well   Behavior During Therapy [facility] for tasks assessed/performed      Past Medical History:  Diagnosis Date  . Anxiety   . Arthritis    back  . Chronic cystitis   . Chronic low back pain   . Depression   . Diverticulosis of colon   . Hemorrhoids   . History of colon polyps    09-24-2009  rectal hyperplastic polyp/   and 2016 non-cancerous  . History of endometriosis   . History of panic attacks   . Hyperlipidemia   . Osteopenia   . Pelvic pain   . Sensation of pressure in bladder area   . Wears glasses     Past Surgical History:  Procedure Laterality Date  . BUNIONECTOMY Left 2004 approx  . CARPAL TUNNEL RELEASE Bilateral right 2014;  left 2007  . COLONOSCOPY W/ POLYPECTOMY  09/24/2009  . CYSTO WITH HYDRODISTENSION [name] 10/15/2016   Procedure: CYSTOSCOPY/HYDRODISTENSION AND MARCAINE INSTILLATION;  Surgeon: Rana Snare, MD;  Location: [facility];  Service: Urology;  Laterality: [name];  . CYSTO/  HYDRODISTENTION/  INSTILLATION THERAPY  1997 approx  . HYSTEROSCOPY W/D&C  11/01/2004   POLYPECTOMY  . IR GASTROSTOMY TUBE REMOVAL  03/20/2017  . IR GENERIC HISTORICAL  01/26/2017   IR GASTROSTOMY TUBE MOD SED 01/26/2017 Sandi Mariscal, MD [facility]-INTERV RAD   . LAPAROSCOPIC VAGINAL HYSTERECTOMY WITH SALPINGO OOPHORECTOMY Bilateral 04/20/2012  . TUBAL LIGATION  yrs ago    There were no vitals filed for this visit.      Subjective Assessment - 10/07/17 1135    Subjective  Pt reports she is doing well   Pertinent History hx of back pain (went to pain management center)   Currently in Pain? No/denies                              OT Education - 10/08/17 1001    Education provided Yes   Education Details Updated HEP red theraband, exercises to perform at gym safely, checked progress towards goals and discussed plans for d/c   Person(s) Educated Patient   Methods Explanation;Demonstration;Verbal cues   Comprehension Verbalized understanding;Returned demonstration;Verbal cues required          OT Short Term Goals - 08/11/17 0001      OT SHORT TERM GOAL #8   Title Pt will demonstrate RUE shoulder flexion to retrieve a lightweight object at 60 with only minimal compensations/ winging.   Status Achieved  grossly 60*     OT SHORT TERM GOAL  #9   TITLE Pt will demonstrate ability to retrieve a lightweight object at 110 shoulder flexion with LUE without reports of  pain.   Status Achieved  met no reports of pain      OT SHORT TERM GOAL  #10   TITLE I with updated HEP- due 08/14/17   Time 6   Period Weeks   Status Achieved  Pt verbalizes understanding of HEP     OT SHORT TERM GOAL  #11   TITLE Pt will demonstrate improved bilateral UE strength and functional use as evidenced by drying and styling her hair with no more than 2 rest breaks.   Baseline required 4 rest breaks today   Time 6   Period Weeks   Status Achieved     OT SHORT TERM GOAL  #12   TITLE Pt will demonstrate ability to retrieve a lightweight  object at 75* with RUE with only min compensations/ scapular winging   Baseline mod compensations, mod winging when retrieving items greater than 60*   Time 6   Period Weeks   Status Achieved     OT  SHORT TERM GOAL  #13   TITLE Pt will report increased ease with opening containers and plastic bags.  75 with min compensations   Time 6   Period Weeks   Status Achieved           OT Long Term Goals - 10/07/17 1136      OT LONG TERM GOAL #8   Title Pt will demonstrate ability to retrieve a 3 lbs weight from overhead shelf with RUE with no more than min compensations.- due 09/27/17   Status Achieved     OT LONG TERM GOAL  #9   Baseline Pt will resume performance of mod complex cooking and home management tasks at a modified independent level.   Status Achieved     OT LONG TERM GOAL  #10   TITLE Pt will demonstrate ability to retrieve a lightweight object at 90* with LUE, with only min compensations, minimal scapular winging in prep for functional use.   Time 12   Period Weeks   Status Achieved  135* with no significant compensations     OT LONG TERM GOAL  #11   TITLE Pt will demonstrate increased UE strength and endurance as evidenced by washing a load of dishes, including odd shaped items with no more than 1 rest break.   Time 12   Period Weeks   Status Achieved     OT LONG TERM GOAL  #12   TITLE I with updated HEP   Time 6   Period Weeks   Status Achieved               Plan - 10/07/17 1137    Clinical Impression Statement Pt returns to therapy for updated HEP. Pt agrees with plans for d/c.   OT Frequency --  4 visits   OT Duration 6 weeks   OT Treatment/Interventions Self-care/ADL training;Electrical Stimulation;Therapeutic exercise;Neuromuscular education;Energy conservation;Manual Therapy;Functional Mobility Training;DME and/or AE instruction;Visual/perceptual remediation/compensation;Cognitive remediation/compensation;Therapeutic activities;Balance training;Patient/family education;Therapeutic exercises;Passive range of motion   Plan Pt agrees with plans for d/c, she plans to start going to gym regularly   [place]  and Agree with Plan of Care Patient;Family member/caregiver   Family Member Consulted husband      Patient will benefit from skilled therapeutic intervention in order to improve the following deficits and impairments:  Decreased coordination, Decreased range of motion, Decreased endurance, Decreased safety awareness, Decreased balance, Decreased cognition, Decreased strength, Impaired perceived functional ability, Impaired vision/preception, Impaired  UE functional use, Impaired tone, Pain, Impaired sensation, Improper body mechanics, Decreased activity tolerance, Abnormal gait  Visit Diagnosis: Muscle weakness (generalized)  Other disturbances of skin sensation  Other lack of coordination  Acute pain of left shoulder    Problem List Patient Active Problem List   Diagnosis Date Noted  . Trigger finger, acquired 09/22/2017  . Bilateral rotator cuff syndrome 04/22/2017  . Acute blood loss anemia   . Physical deconditioning   . Tracheostomy status ([place])   . Hypoxemia   . Dysphagia   . Numbness and tingling   . S/P percutaneous endoscopic gastrostomy (PEG) tube placement ([place])   . Bradycardia   . Left corneal abrasion   . Abnormal chest x-ray   . Acute respiratory failure with hypoxia ([place])   . Atrial fibrillation with RVR ([place])   . Dysphagia, pharyngoesophageal phase   . History of ETT   . Encounter for central line placement   . Encounter for feeding tube placement   . Weakness of right lower extremity   . SOB (shortness of breath)   . Pneumonia   . Pressure injury of skin 01/16/2017  . Acute respiratory failure ([place])   . Hyponatremia with decreased serum osmolality   . Hyponatremia   . Quadriplegia and quadriparesis ([place]) 01/14/2017  . GBS (Guillain Barre syndrome) ([place]) 01/13/2017  . Weakness 01/12/2017  . Hypokalemia 01/12/2017  . Endometriosis 12/05/2013  . Chronic low back pain 08/19/2012  . HYPERLIPIDEMIA 12/10/2010  . COLONIC POLYPS, HX OF 12/13/2009  .  HYPERGLYCEMIA, FASTING 11/22/2008  . Depression 02/07/2008  . ANOSMIA 02/07/2008  . HEADACHE, CHRONIC 02/07/2008  . MITRAL VALVE PROLAPSE 10/05/2007  . OSTEOPENIA 10/05/2007   OCCUPATIONAL THERAPY DISCHARGE SUMMARY    Current functional level related to goals / functional outcomes: Pt made excellent overall progress, she agrees with plans for d/c.   Remaining deficits: Mildly decreased strength  Education / Equipment Pt was educated in [employer] and activities to perform at the gym. Pt agrees with plans for discharge.  Plan: Patient agrees to discharge.  Patient goals were met. Patient is being discharged due to meeting the stated rehab goals.  ?????     RINE,KATHRYN 10/08/2017, 10:04 AM Theone Murdoch, OTR/L [phone] Phone: [phone] 10:04 AM 10/08/17 [facility] [address] [place], [place], [address] Phone: [phone]   Fax:  [phone]  Name: Joanne Morrison MRN: [id number] Date of Birth: Mar 18, 1951

## 2017-10-19 ENCOUNTER — Telehealth: Payer: Self-pay | Admitting: Physical Medicine & Rehabilitation

## 2017-10-19 NOTE — Telephone Encounter (Signed)
Pt phoned and stated last visit an injection into her fingers was discussed. She is inquiring as to whether she can go ahead and schedule that injection. Pt stated it was the two fingers that were "trigger fingers." Please advise.

## 2017-10-19 NOTE — Telephone Encounter (Signed)
We can set that up if she would like.  Please go ahead.  Thanks

## 2017-10-29 ENCOUNTER — Encounter: Payer: Self-pay | Admitting: Speech Pathology

## 2017-10-29 NOTE — Therapy (Signed)
SPEECH THERAPY DISCHARGE SUMMARY  Visits from Start of Care: 2  Current functional level related to goals / functional outcomes: See evaluation for details; insufficient time to assess/achieve functional outcomes as pt attended only evaluation and one treatment session.   Remaining deficits: See evaluation for details.   Education / Equipment: Pt was educated in strategies for memory and attention.   Plan: Patient agrees to discharge.  Patient goals were not met. Patient is being discharged due to not returning since the last visit.  ?????         SLP Short Term Goals - 10/29/17 1255      SLP SHORT TERM GOAL #1   Title  Pt will perform HEP for facial weakness with mod I    Status  Not Met      SLP SHORT TERM GOAL #2   Title  Pt will solve moderately complex reasoning, organizing, and functional math problems with rare min A and 85% accuracy    Status  Not Met      SLP SHORT TERM GOAL #3   Title  Pt will alternate attention between 2 simple cognitive lingusitic tasks with 85% on each and occasional min A    Status  Not Met      SLP Long Term Goals - 10/29/17 1255      SLP LONG TERM GOAL #1   Title  Pt will divide attention between 2 simple cognitive linguistic tasks with  90% on each and rare min A    Status  Not Met      SLP LONG TERM GOAL #2   Title  Pt will verbalize 3 compensations for attention with mod I    Status  Not [place], [place], [facility] [address] [place], [place], [address] Phone: [phone]   Fax:  [phone]  Patient Details  Name: Joanne Morrison MRN: [id number] Date of Birth: Dec 11, 1951 Referring Provider:  No ref. provider found  Encounter Date: 10/29/2017   Aliene Altes 10/29/2017, 12:56 PM  [facility] [address] [place], [place], [address] Phone:  [phone]   Fax:  [phone]

## 2017-11-04 ENCOUNTER — Encounter: Payer: Self-pay | Admitting: Physical Medicine & Rehabilitation

## 2017-11-04 ENCOUNTER — Encounter
Payer: BLUE CROSS/BLUE SHIELD | Attending: Physical Medicine & Rehabilitation | Admitting: Physical Medicine & Rehabilitation

## 2017-11-04 VITALS — BP 154/87 | HR 56

## 2017-11-04 DIAGNOSIS — E785 Hyperlipidemia, unspecified: Secondary | ICD-10-CM | POA: Insufficient documentation

## 2017-11-04 DIAGNOSIS — G8250 Quadriplegia, unspecified: Secondary | ICD-10-CM | POA: Diagnosis not present

## 2017-11-04 DIAGNOSIS — M545 Low back pain: Secondary | ICD-10-CM | POA: Insufficient documentation

## 2017-11-04 DIAGNOSIS — G610 Guillain-Barre syndrome: Secondary | ICD-10-CM | POA: Diagnosis present

## 2017-11-04 DIAGNOSIS — H02206 Unspecified lagophthalmos left eye, unspecified eyelid: Secondary | ICD-10-CM | POA: Insufficient documentation

## 2017-11-04 DIAGNOSIS — F329 Major depressive disorder, single episode, unspecified: Secondary | ICD-10-CM | POA: Insufficient documentation

## 2017-11-04 DIAGNOSIS — Z79899 Other long term (current) drug therapy: Secondary | ICD-10-CM | POA: Insufficient documentation

## 2017-11-04 DIAGNOSIS — Z931 Gastrostomy status: Secondary | ICD-10-CM | POA: Insufficient documentation

## 2017-11-04 DIAGNOSIS — F419 Anxiety disorder, unspecified: Secondary | ICD-10-CM | POA: Diagnosis not present

## 2017-11-04 DIAGNOSIS — M8580 Other specified disorders of bone density and structure, unspecified site: Secondary | ICD-10-CM | POA: Insufficient documentation

## 2017-11-04 DIAGNOSIS — Z8262 Family history of osteoporosis: Secondary | ICD-10-CM | POA: Diagnosis not present

## 2017-11-04 DIAGNOSIS — Z8249 Family history of ischemic heart disease and other diseases of the circulatory system: Secondary | ICD-10-CM | POA: Diagnosis not present

## 2017-11-04 DIAGNOSIS — I4891 Unspecified atrial fibrillation: Secondary | ICD-10-CM | POA: Diagnosis not present

## 2017-11-04 DIAGNOSIS — G8929 Other chronic pain: Secondary | ICD-10-CM | POA: Diagnosis not present

## 2017-11-04 DIAGNOSIS — M6530 Trigger finger, unspecified finger: Secondary | ICD-10-CM | POA: Diagnosis not present

## 2017-11-04 DIAGNOSIS — R001 Bradycardia, unspecified: Secondary | ICD-10-CM | POA: Insufficient documentation

## 2017-11-04 DIAGNOSIS — M1811 Unilateral primary osteoarthritis of first carpometacarpal joint, right hand: Secondary | ICD-10-CM

## 2017-11-04 DIAGNOSIS — L299 Pruritus, unspecified: Secondary | ICD-10-CM | POA: Insufficient documentation

## 2017-11-04 NOTE — Patient Instructions (Signed)
PLEASE FEEL FREE TO CALL OUR OFFICE WITH ANY PROBLEMS OR QUESTIONS ([phone])      

## 2017-11-04 NOTE — Progress Notes (Signed)
Procedure note:  Dx: Trigger finger right 4th finger and OA of right first MCP joint   Procedure: After informed consent and preparation of the skin with betadine and isopropyl alcohol, I injected 3mg  (0.5cc) of celestone and 2cc of 1% lidocaine into the right 1st MCP joint via anterior approach. Additionally, aspiration was performed prior to injection. The patient tolerated well, and no complications were encountered. Afterward the area was cleaned and dressed. Post- injection instructions were provided.    After informed consent and preparation of the skin with betadine and isopropyl alcohol, I injected 3mg  (0.5cc) of celestone and 2cc of 1% lidocaine into  the distal flexor pulley area via anterior approach. Additionally, aspiration was performed prior to injection. The patient tolerated well, and no complications were encountered. Afterward the area was cleaned and dressed. Post- injection instructions were provided.

## 2017-12-22 DIAGNOSIS — G510 Bell's palsy: Secondary | ICD-10-CM | POA: Diagnosis not present

## 2017-12-23 ENCOUNTER — Encounter
Payer: BLUE CROSS/BLUE SHIELD | Attending: Physical Medicine & Rehabilitation | Admitting: Physical Medicine & Rehabilitation

## 2017-12-23 ENCOUNTER — Encounter: Payer: Self-pay | Admitting: Physical Medicine & Rehabilitation

## 2017-12-23 VITALS — BP 135/85 | HR 78

## 2017-12-23 DIAGNOSIS — R001 Bradycardia, unspecified: Secondary | ICD-10-CM | POA: Diagnosis not present

## 2017-12-23 DIAGNOSIS — M8580 Other specified disorders of bone density and structure, unspecified site: Secondary | ICD-10-CM | POA: Diagnosis not present

## 2017-12-23 DIAGNOSIS — G610 Guillain-Barre syndrome: Secondary | ICD-10-CM | POA: Diagnosis not present

## 2017-12-23 DIAGNOSIS — Z8249 Family history of ischemic heart disease and other diseases of the circulatory system: Secondary | ICD-10-CM | POA: Diagnosis not present

## 2017-12-23 DIAGNOSIS — G8250 Quadriplegia, unspecified: Secondary | ICD-10-CM | POA: Diagnosis not present

## 2017-12-23 DIAGNOSIS — H02206 Unspecified lagophthalmos left eye, unspecified eyelid: Secondary | ICD-10-CM | POA: Diagnosis not present

## 2017-12-23 DIAGNOSIS — E785 Hyperlipidemia, unspecified: Secondary | ICD-10-CM | POA: Diagnosis not present

## 2017-12-23 DIAGNOSIS — Z931 Gastrostomy status: Secondary | ICD-10-CM | POA: Diagnosis not present

## 2017-12-23 DIAGNOSIS — M545 Low back pain: Secondary | ICD-10-CM | POA: Insufficient documentation

## 2017-12-23 DIAGNOSIS — Z79899 Other long term (current) drug therapy: Secondary | ICD-10-CM | POA: Insufficient documentation

## 2017-12-23 DIAGNOSIS — F329 Major depressive disorder, single episode, unspecified: Secondary | ICD-10-CM | POA: Insufficient documentation

## 2017-12-23 DIAGNOSIS — F419 Anxiety disorder, unspecified: Secondary | ICD-10-CM | POA: Diagnosis not present

## 2017-12-23 DIAGNOSIS — Z8262 Family history of osteoporosis: Secondary | ICD-10-CM | POA: Insufficient documentation

## 2017-12-23 DIAGNOSIS — M6530 Trigger finger, unspecified finger: Secondary | ICD-10-CM | POA: Diagnosis not present

## 2017-12-23 DIAGNOSIS — L299 Pruritus, unspecified: Secondary | ICD-10-CM | POA: Insufficient documentation

## 2017-12-23 DIAGNOSIS — G8929 Other chronic pain: Secondary | ICD-10-CM | POA: Insufficient documentation

## 2017-12-23 DIAGNOSIS — I4891 Unspecified atrial fibrillation: Secondary | ICD-10-CM | POA: Insufficient documentation

## 2017-12-23 NOTE — Progress Notes (Signed)
Subjective:    Patient ID: Joanne Morrison, female    DOB: 30-Jun-1951, 67 y.o.   MRN: [id number]  HPI  Mrs Neuberger is here in follow up of her GBS. She had great results with the trigger finger injections in February. She has noticed minimal "catching" since then. She sometimes tapes her fingers at night.   She had a fall in the snow in December but avoided any significant injury.   She has some residual perioral numbness and left lid droop although both are improving.   She has noticed some short term memory deficits and word finding problems but they are gradually resolving. Her swallow is slow but functional.   Pain Inventory Average Pain 3 Pain Right Now 1 My pain is sharp  In the last 24 hours, has pain interfered with the following? General activity 1 Relation with others 1 Enjoyment of life 1 What TIME of day is your pain at its worst? . Sleep (in general) Fair  Pain is worse with: walking, bending and standing Pain improves with: rest and heat/ice Relief from Meds: .  Mobility walk without assistance ability to climb steps?  yes do you drive?  yes  Function retired  Neuro/Psych bladder control problems confusion loss of taste or smell  Prior Studies Any changes since last visit?  no  Physicians involved in your care Any changes since last visit?  no   Family History  Problem Relation Age of Onset  . Hypertension Mother   . Dementia Mother   . Lung cancer Mother        smoker  . Osteoporosis Mother   . Heart attack Father 23  . Hypertension Brother   . Pulmonary embolism Brother 60       ?etilology  . Diabetes Maternal Aunt   . Cancer Maternal Uncle        RENAL  . Heart attack Maternal Grandmother 86  . Lung cancer Maternal Grandfather        Ecologist  . Anesthesia problems Neg Hx   . Stroke Neg Hx    Social History   Socioeconomic History  . Marital status: Married    Spouse name: [name] Resides  . Number of children: 1  . Years of  education: 22  . Highest education level: Not on file  Social Needs  . Financial resource strain: Not on file  . Food insecurity - worry: Not on file  . Food insecurity - inability: Not on file  . Transportation needs - medical: Not on file  . Transportation needs - non-medical: Not on file  Occupational History  . Not on file  Tobacco Use  . Smoking status: Never Smoker  . Smokeless tobacco: Never Used  Substance and Sexual Activity  . Alcohol use: No  . Drug use: No  . Sexual activity: Yes    Birth control/protection: None  Other Topics Concern  . Not on file  Social History Narrative   Lives w/ husband   Caffeine use: 2 drinks per month    Right handed   Past Surgical History:  Procedure Laterality Date  . BUNIONECTOMY Left 2004 approx  . CARPAL TUNNEL RELEASE Bilateral right 2014;  left 2007  . COLONOSCOPY W/ POLYPECTOMY  09/24/2009  . CYSTO WITH HYDRODISTENSION [name] 10/15/2016   Procedure: CYSTOSCOPY/HYDRODISTENSION AND MARCAINE INSTILLATION;  Surgeon: Rana Snare, MD;  Location[facility];  Service: Urology;  Laterality: [name];  . CYSTO/  HYDRODISTENTION/  INSTILLATION THERAPY  1997 approx  .  HYSTEROSCOPY W/D&C  11/01/2004   POLYPECTOMY  . IR GASTROSTOMY TUBE REMOVAL  03/20/2017  . IR GENERIC HISTORICAL  01/26/2017   IR GASTROSTOMY TUBE MOD SED 01/26/2017 Sandi Mariscal, MD [facility]-INTER[facility]  . LAPAROSCOPIC VAGINAL HYSTERECTOMY WITH SALPINGO OOPHORECTOMY Bilateral 04/20/2012  . TUBAL LIGATION  yrs ago   Past Medical History:  Diagnosis Date  . Anxiety   . Arthritis    back  . Chronic cystitis   . Chronic low back pain   . Depression   . Diverticulosis of colon   . Hemorrhoids   . History of colon polyps    09-24-2009  rectal hyperplastic polyp/   and 2016 non-cancerous  . History of endometriosis   . History of panic attacks   . Hyperlipidemia   . Osteopenia   . Pelvic pain   . Sensation of pressure in bladder area   . Wears glasses    There were  no vitals taken for this visit.  Opioid Risk Score:   Fall Risk Score:  `1  Depression screen PHQ 2/9  Depression screen [facility] 2/9 03/04/2017 12/05/2013  Decreased Interest 3 0  Down, Depressed, Hopeless 3 0  PHQ - 2 Score 6 0  Altered sleeping 2 -  Tired, decreased energy 3 -  Change in appetite 0 -  Feeling bad or failure about yourself  0 -  Trouble concentrating 3 -  Moving slowly or fidgety/restless 3 -  Suicidal thoughts 0 -  PHQ-9 Score 17 -  Difficult doing work/chores Extremely dIfficult -    Review of Systems  Constitutional: Positive for diaphoresis.  HENT: Negative.   Eyes: Negative.   Respiratory: Negative.   Cardiovascular: Negative.   Gastrointestinal: Negative.   Endocrine: Negative.   Genitourinary: Positive for difficulty urinating.  Musculoskeletal: Negative.   Skin: Negative.   Allergic/Immunologic: Negative.   Neurological: Negative.   Hematological: Negative.   Psychiatric/Behavioral: Negative.   All other systems reviewed and are negative.      Objective:   Physical Exam  Constitutional: She appears well-developed and well-nourished. NAD.  HENT: Normocephalic. Atraumatic.  Eyes: Left lid with attached weight. Sclera white.  Neck: trach scar Cardiovascular: reg rate Respiratory: cta b GI: Soft. Bowel sounds are normal.  Musculoskeletal: mild IR/ER pain right upper ext. LUE with miimal pain Neurological: She is alert and oriented.  Motor:  LUE: 4+/5 proximally, 4+/5 distally  RUE: 4 to 4++/5. Still a little more weak proximally.  Decreased sensation over toes which is resolving  Mild left facial droop. Speech clear. Lid Closes with weight Normal gait.  Skin: Skin is warm and dry.  Psychiatric: Normal mood and behavior.      Assessment & Plan:  Medical Problem List and Plan: 1. 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  -continue with HEP, outpt therapies to completion 2. Neuropathic  pain/spasms -improved -baclofen 5-10 mg at night for spasms and sleep 3. Mood: Zoloft 50 mg daily.  3. Right handed trigger finger             -voltaren gel                 -continue HEP, wrapping   -good results with trigger finger injections 4. 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 Underwent tarorhaphy 02/04/2017. Continue erythromycin ointment for now follow-up ophthalmology services as needed, -continue with plan per optho. Continues to slowly improve   15 minutes of face to face patient care time were spent during this visit. Follow up in  6 months. All questions were encouraged and answered. Greater than 50% of time during this encounter was spent counseling patient/family in regard to neurological recovery, orthotics, etc.

## 2017-12-23 NOTE — Patient Instructions (Signed)
PLEASE FEEL FREE TO CALL OUR OFFICE WITH ANY PROBLEMS OR QUESTIONS ([phone])      

## 2017-12-24 DIAGNOSIS — G894 Chronic pain syndrome: Secondary | ICD-10-CM | POA: Diagnosis not present

## 2017-12-24 DIAGNOSIS — M47816 Spondylosis without myelopathy or radiculopathy, lumbar region: Secondary | ICD-10-CM | POA: Diagnosis not present

## 2017-12-24 DIAGNOSIS — M4316 Spondylolisthesis, lumbar region: Secondary | ICD-10-CM | POA: Diagnosis not present

## 2017-12-24 DIAGNOSIS — Z79891 Long term (current) use of opiate analgesic: Secondary | ICD-10-CM | POA: Diagnosis not present

## 2018-01-08 ENCOUNTER — Other Ambulatory Visit: Payer: Self-pay | Admitting: Physical Medicine & Rehabilitation

## 2018-01-08 DIAGNOSIS — G8250 Quadriplegia, unspecified: Secondary | ICD-10-CM

## 2018-02-02 DIAGNOSIS — H04123 Dry eye syndrome of bilateral lacrimal glands: Secondary | ICD-10-CM | POA: Diagnosis not present

## 2018-02-11 DIAGNOSIS — N3010 Interstitial cystitis (chronic) without hematuria: Secondary | ICD-10-CM | POA: Diagnosis not present

## 2018-02-18 DIAGNOSIS — Z79891 Long term (current) use of opiate analgesic: Secondary | ICD-10-CM | POA: Diagnosis not present

## 2018-02-18 DIAGNOSIS — M47816 Spondylosis without myelopathy or radiculopathy, lumbar region: Secondary | ICD-10-CM | POA: Diagnosis not present

## 2018-02-18 DIAGNOSIS — M4316 Spondylolisthesis, lumbar region: Secondary | ICD-10-CM | POA: Diagnosis not present

## 2018-02-18 DIAGNOSIS — G894 Chronic pain syndrome: Secondary | ICD-10-CM | POA: Diagnosis not present

## 2018-03-09 ENCOUNTER — Emergency Department (HOSPITAL_COMMUNITY)
Admission: EM | Admit: 2018-03-09 | Discharge: 2018-03-09 | Disposition: A | Discharge status: Home / Self Care | Payer: Medicare Other | Attending: Emergency Medicine | Admitting: Emergency Medicine

## 2018-03-09 ENCOUNTER — Other Ambulatory Visit: Payer: Self-pay

## 2018-03-09 ENCOUNTER — Encounter (HOSPITAL_COMMUNITY): Payer: Self-pay | Admitting: *Deleted

## 2018-03-09 DIAGNOSIS — Z5321 Procedure and treatment not carried out due to patient leaving prior to being seen by health care provider: Secondary | ICD-10-CM | POA: Diagnosis not present

## 2018-03-09 DIAGNOSIS — R1110 Vomiting, unspecified: Secondary | ICD-10-CM | POA: Insufficient documentation

## 2018-03-09 HISTORY — DX: Guillain-Barre syndrome: G61.0

## 2018-03-09 LAB — COMPREHENSIVE METABOLIC PANEL
ALT: 20 U/L (ref 14–54)
AST: 24 U/L (ref 15–41)
Albumin: 3.7 g/dL (ref 3.5–5.0)
Alkaline Phosphatase: 82 U/L (ref 38–126)
Anion gap: 9 (ref 5–15)
BILIRUBIN TOTAL: 0.8 mg/dL (ref 0.3–1.2)
BUN: 17 mg/dL (ref 6–20)
CALCIUM: 9.1 mg/dL (ref 8.9–10.3)
CHLORIDE: 104 mmol/L (ref 101–111)
CO2: 24 mmol/L (ref 22–32)
CREATININE: 0.87 mg/dL (ref 0.44–1.00)
Glucose, Bld: 138 mg/dL — ABNORMAL HIGH (ref 65–99)
Potassium: 3.8 mmol/L (ref 3.5–5.1)
Sodium: 137 mmol/L (ref 135–145)
TOTAL PROTEIN: 6.7 g/dL (ref 6.5–8.1)

## 2018-03-09 LAB — URINALYSIS, ROUTINE W REFLEX MICROSCOPIC
Bilirubin Urine: NEGATIVE
Glucose, UA: NEGATIVE mg/dL
Hgb urine dipstick: NEGATIVE
KETONES UR: NEGATIVE mg/dL
LEUKOCYTES UA: NEGATIVE
NITRITE: NEGATIVE
PROTEIN: NEGATIVE mg/dL
Specific Gravity, Urine: 1.019 (ref 1.005–1.030)
pH: 7 (ref 5.0–8.0)

## 2018-03-09 LAB — CBC
HCT: 42.5 % (ref 36.0–46.0)
Hemoglobin: 13.3 g/dL (ref 12.0–15.0)
MCH: 25.7 pg — ABNORMAL LOW (ref 26.0–34.0)
MCHC: 31.3 g/dL (ref 30.0–36.0)
MCV: 82 fL (ref 78.0–100.0)
PLATELETS: 244 10*3/uL (ref 150–400)
RBC: 5.18 MIL/uL — AB (ref 3.87–5.11)
RDW: 14.5 % (ref 11.5–15.5)
WBC: 8.9 10*3/uL (ref 4.0–10.5)

## 2018-03-09 LAB — LIPASE, BLOOD: LIPASE: 31 U/L (ref 11–51)

## 2018-03-09 MED ORDER — ONDANSETRON 4 MG PO TBDP
4.0000 mg | ORAL_TABLET | Freq: Once | ORAL | Status: AC | PRN
  Administered 2018-03-09: 4 mg via ORAL
  Filled 2018-03-09: qty 1

## 2018-03-09 NOTE — ED Triage Notes (Signed)
Pt states felt great yesterday and then at 9pm started vomiting with diarrhea and had pain to upper abdominal area and in the lower abdominal area.

## 2018-03-14 IMAGING — MR MR HEAD WO/W CM
21 of 42 series · 25 of 48 positions shown · IV contrast (multihance)
Comparison: None.

CLINICAL DATA: Bilateral lower extremity weakness and paresthesia

EXAM:
MRI HEAD WITHOUT AND WITH CONTRAST
TECHNIQUE: Multiplanar, multiecho pulse sequences of the brain and surrounding
structures were obtained without and with intravenous contrast.
CONTRAST:  15mL MULTIHANCE GADOBENATE DIMEGLUMINE 529 MG/ML IV SOLN

[Series 2: T2 · sagittal · 3.0mm · 0.43mm/px · 1 of 14 slices shown (1 of 8)]
[im 1/14]
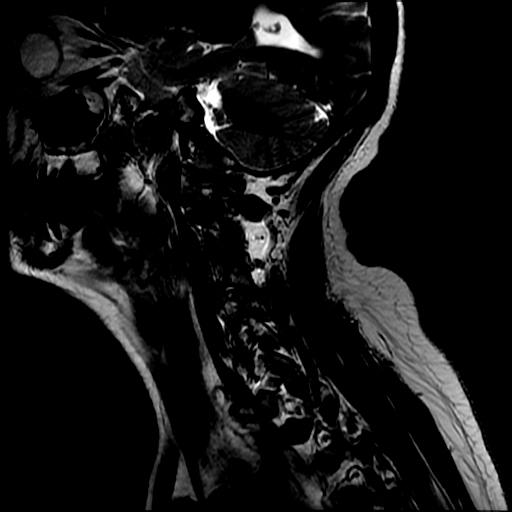

[Series 3: FLAIR · sagittal · 3.0mm · 0.43mm/px · 1 of 14 slices shown (1 of 3)]
[im 1/14]
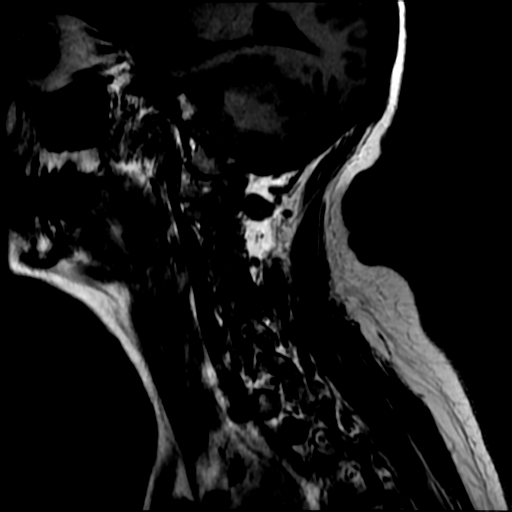

[Series 6: T2 · axial · 3.0mm · 0.35mm/px · 1 of 28 slices shown (2 of 8)]
[im 1/28]
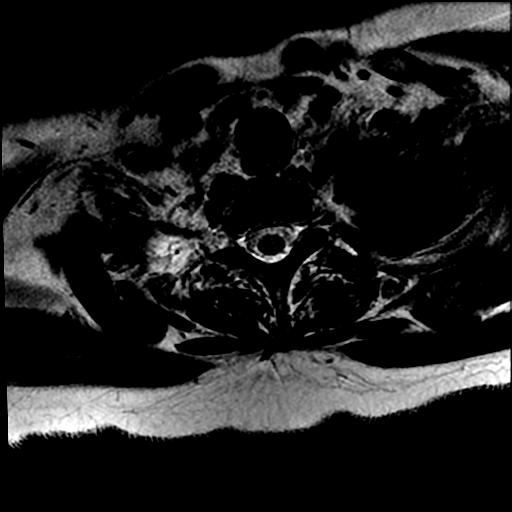

[Series 11: T2 · sagittal · 3.0mm · 0.66mm/px · 1 of 16 slices shown (3 of 8)]
[im 1/16]
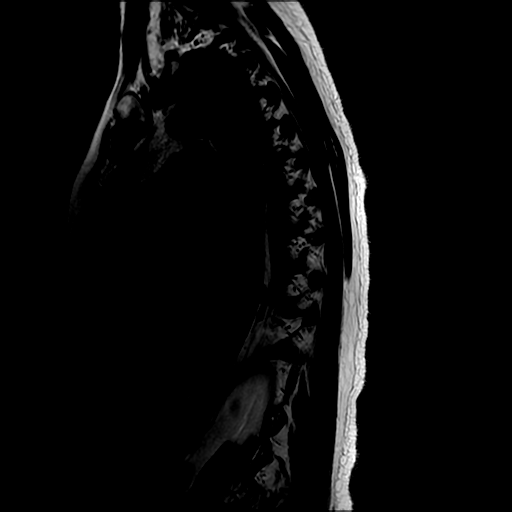

[Series 14: T2 · axial · 4.0mm · 0.39mm/px · 1 of 25 slices shown (4 of 8)]
[im 1/25]
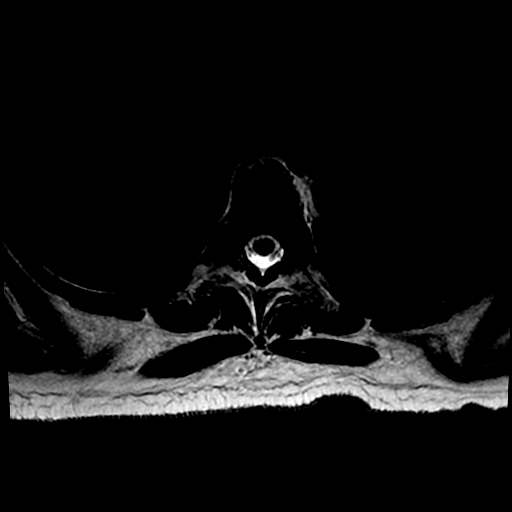

[Series 15: T2 · axial · 4.0mm · 0.39mm/px · 1 of 27 slices shown (5 of 8)]
[im 1/27]
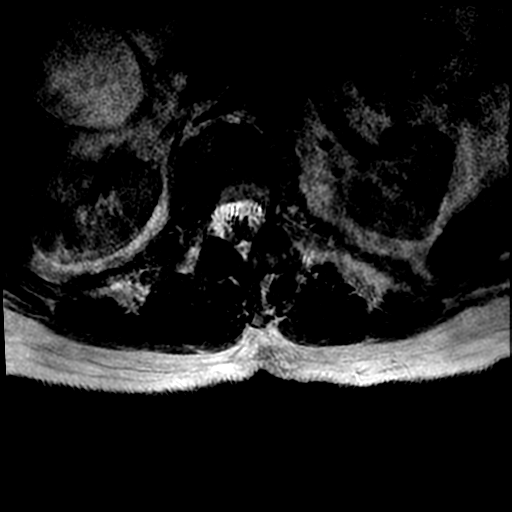

[Series 25: T2 · sagittal · 4.0mm · 0.55mm/px · 1 of 16 slices shown (6 of 8)]
[im 1/16]
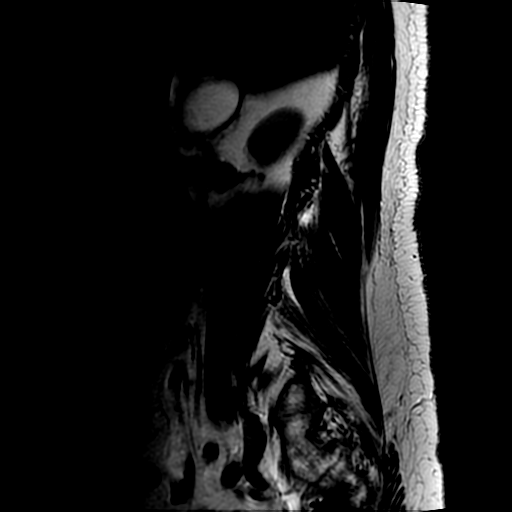

[Series 28: T2 · axial · 4.0mm · 0.39mm/px · 1 of 36 slices shown (7 of 8)]
[im 1/36]
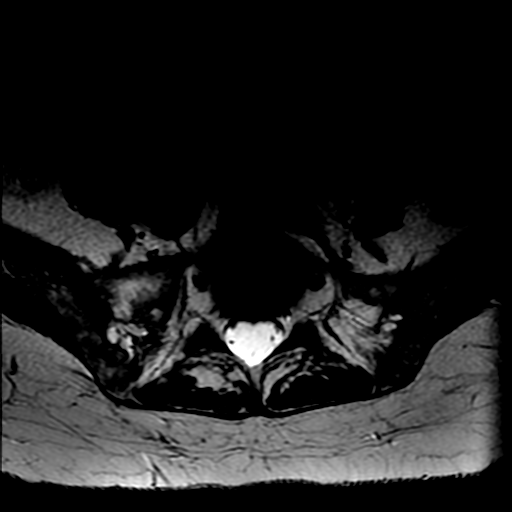

[Series 32: FLAIR · sagittal · 5.0mm · 0.47mm/px · 1 of 23 slices shown (2 of 3)]
[im 1/23]
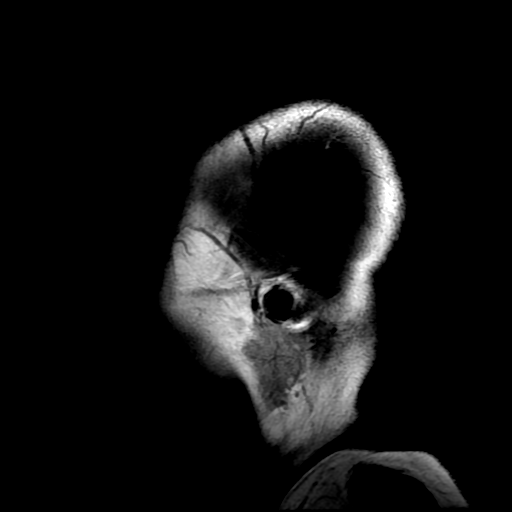

[Series 34: DWI · axial · 3.0mm · 0.94mm/px · z∈[+30,+185]mm · 3 of 106 slices shown (1 of 2)]
[im 1/106]
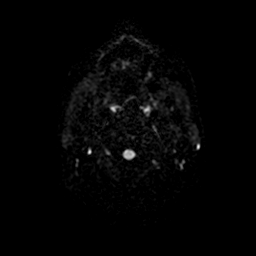
[im 53/106]
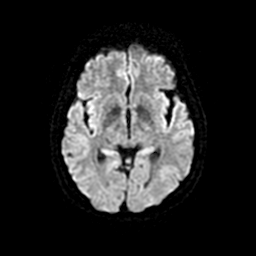
[im 106/106]
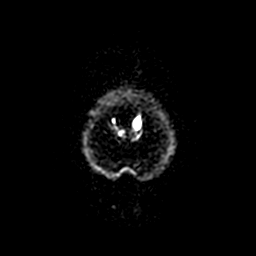

[Series 35: T2 · axial · 5.0mm · 0.47mm/px · 1 of 27 slices shown (8 of 8)]
[im 1/27]
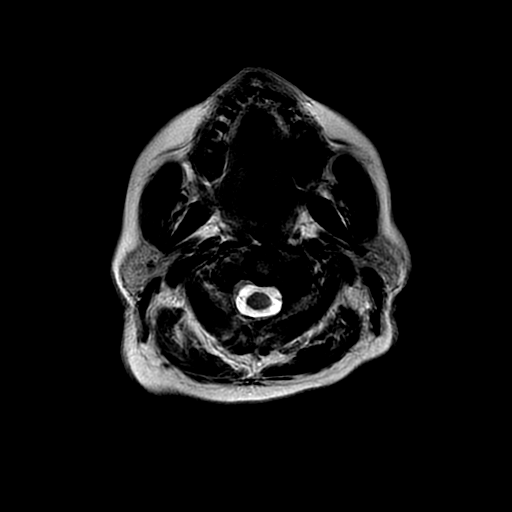

[Series 36: FLAIR · axial · 5.0mm · 0.47mm/px · 1 of 27 slices shown (3 of 3)]
[im 1/27]
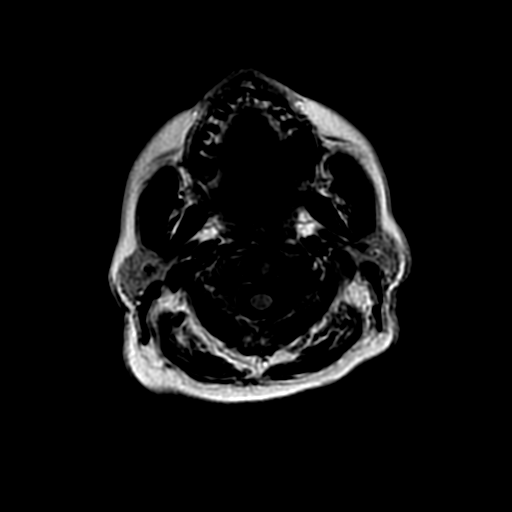

[Series 37: DWI · coronal · 4.0mm · 0.94mm/px · 2 of 68 slices shown (2 of 2)]
[im 1/68]
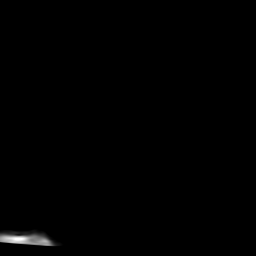
[im 68/68]
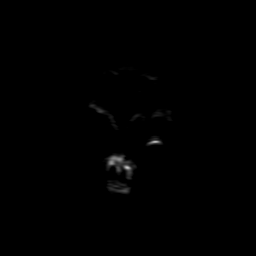

[Series 40: T2 post-contrast · coronal · 5.0mm · 0.43mm/px · 1 of 28 slices shown]
[im 1/28]
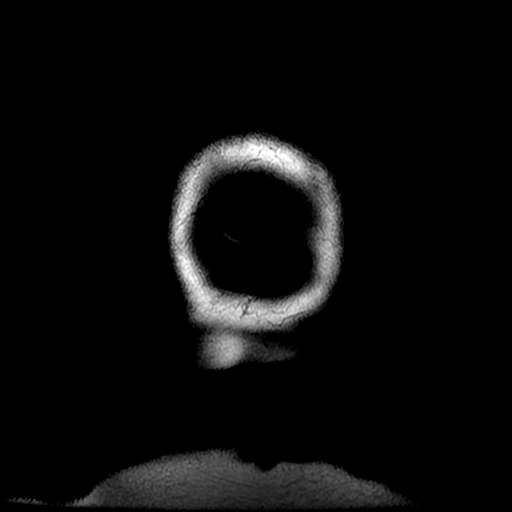

[Series 43: T1 post-contrast · sagittal · 4.0mm · 0.55mm/px · 1 of 16 slices shown (1 of 5)]
[im 1/16]
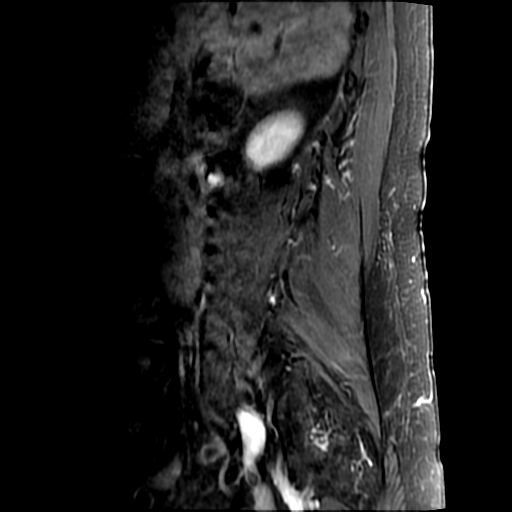

[Series 44: T1 post-contrast · axial · 4.0mm · 0.39mm/px · 1 of 37 slices shown (2 of 5)]
[im 1/37]
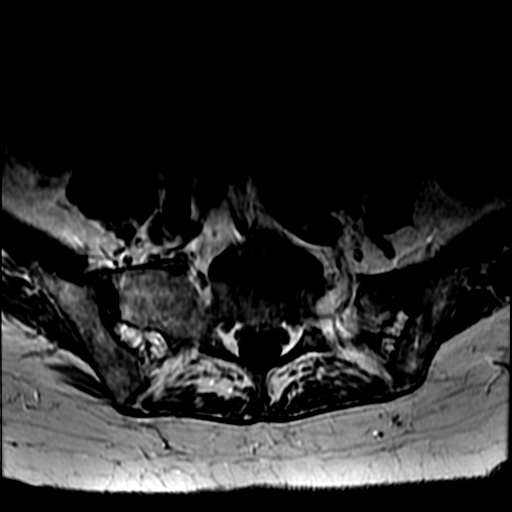

[Series 50: T1 post-contrast · axial · 3.0mm · 0.35mm/px · 1 of 28 slices shown (3 of 5)]
[im 1/28]
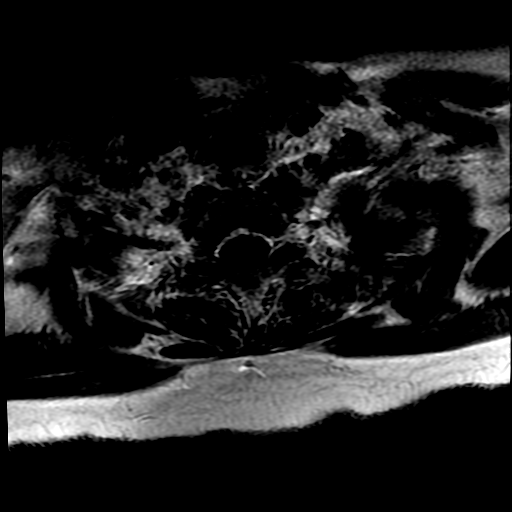

[Series 54: T1 post-contrast · axial · 4.0mm · 0.39mm/px · 1 of 30 slices shown (4 of 5)]
[im 1/30]
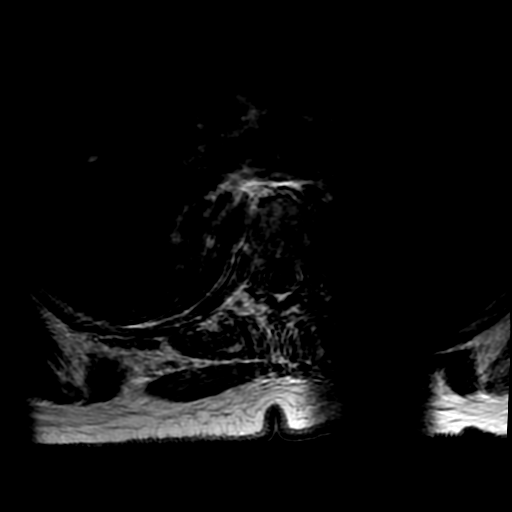

[Series 55: T1 post-contrast · axial · 4.0mm · 0.39mm/px · 1 of 28 slices shown (5 of 5)]
[im 1/28]
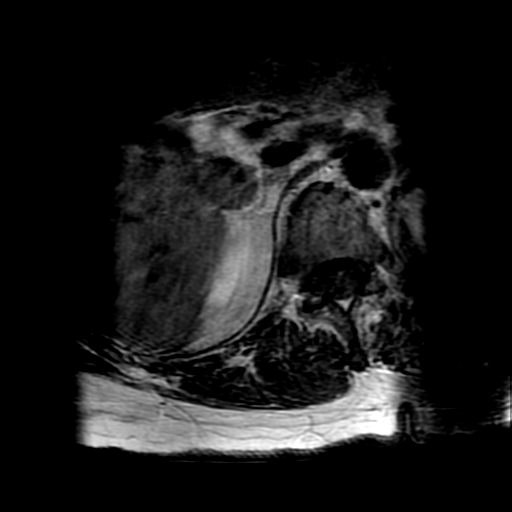

[Series 3450: ADC · axial · 3.0mm · 0.94mm/px · z∈[+30,+185]mm · 2 of 52 slices shown (1 of 2)]
[im 1/52]
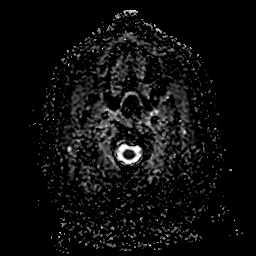
[im 52/52]
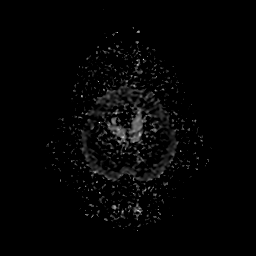

[Series 3750: ADC · coronal · 4.0mm · 0.94mm/px · 1 of 34 slices shown (2 of 2)]
[im 1/34]
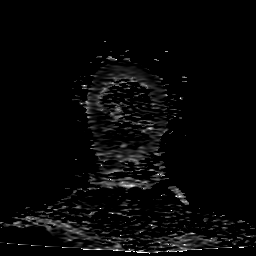

[25 of 48 positions shown; findings below may reference images not displayed]

FINDINGS: Brain: The brain has normal appearance without evidence of
malformation, atrophy, old or acute small or large vessel
infarction, hemorrhage, hydrocephalus or extra-axial collection. No
pituitary abnormality. No abnormal enhancement occurs.

Vascular: Major vessels at the base of the brain show flow.

Skull and upper cervical spine: Normal

Sinuses/Orbits: Clear/ normal.

Other: None significant.
IMPRESSION: Normal exam.

## 2018-03-16 DIAGNOSIS — Z6829 Body mass index (BMI) 29.0-29.9, adult: Secondary | ICD-10-CM | POA: Diagnosis not present

## 2018-03-16 DIAGNOSIS — G510 Bell's palsy: Secondary | ICD-10-CM | POA: Diagnosis not present

## 2018-03-16 DIAGNOSIS — A084 Viral intestinal infection, unspecified: Secondary | ICD-10-CM | POA: Diagnosis not present

## 2018-03-16 DIAGNOSIS — R197 Diarrhea, unspecified: Secondary | ICD-10-CM | POA: Diagnosis not present

## 2018-03-16 DIAGNOSIS — H04123 Dry eye syndrome of bilateral lacrimal glands: Secondary | ICD-10-CM | POA: Diagnosis not present

## 2018-03-18 DIAGNOSIS — R197 Diarrhea, unspecified: Secondary | ICD-10-CM | POA: Diagnosis not present

## 2018-03-22 DIAGNOSIS — R197 Diarrhea, unspecified: Secondary | ICD-10-CM | POA: Diagnosis not present

## 2018-03-30 DIAGNOSIS — F419 Anxiety disorder, unspecified: Secondary | ICD-10-CM | POA: Diagnosis not present

## 2018-04-16 ENCOUNTER — Telehealth: Payer: Self-pay | Admitting: Physical Medicine & Rehabilitation

## 2018-04-16 NOTE — Telephone Encounter (Signed)
Called mrs Reining to verify which injection, it is her TRIGGER FINGER INJECTIONS. Transferred call to [facility] for scheduling

## 2018-04-16 NOTE — Telephone Encounter (Signed)
Trigger finger injections??  Just schedule her for procedure. thx

## 2018-04-16 NOTE — Telephone Encounter (Signed)
Pt phoned to state that her inflammation has returned. She is requesting more TP injections. Please advise.

## 2018-04-20 DIAGNOSIS — G894 Chronic pain syndrome: Secondary | ICD-10-CM | POA: Diagnosis not present

## 2018-04-20 DIAGNOSIS — M4316 Spondylolisthesis, lumbar region: Secondary | ICD-10-CM | POA: Diagnosis not present

## 2018-04-20 DIAGNOSIS — Z79891 Long term (current) use of opiate analgesic: Secondary | ICD-10-CM | POA: Diagnosis not present

## 2018-04-20 DIAGNOSIS — M47816 Spondylosis without myelopathy or radiculopathy, lumbar region: Secondary | ICD-10-CM | POA: Diagnosis not present

## 2018-04-26 ENCOUNTER — Encounter: Payer: Self-pay | Admitting: Physical Medicine & Rehabilitation

## 2018-04-26 ENCOUNTER — Encounter: Payer: Medicare Other | Attending: Physical Medicine & Rehabilitation | Admitting: Physical Medicine & Rehabilitation

## 2018-04-26 ENCOUNTER — Other Ambulatory Visit: Payer: Self-pay

## 2018-04-26 VITALS — BP 143/89 | HR 72 | Ht 62.0 in | Wt 165.0 lb

## 2018-04-26 DIAGNOSIS — G610 Guillain-Barre syndrome: Secondary | ICD-10-CM

## 2018-04-26 DIAGNOSIS — M6530 Trigger finger, unspecified finger: Secondary | ICD-10-CM | POA: Insufficient documentation

## 2018-04-26 NOTE — Progress Notes (Signed)
PROCEDURE VISIT:   After informed consent and preparation of the skin with betadine and isopropyl alcohol, I injected 3mg  (0.5cc) of celestone and 1.5cc of 1% lidocaine around the A1 pulley/flexor tendon of the right first and fourth fingers  via anterior approach. Additionally, we injected the A2 pulley of the thumb. Aspiration was performed prior to each injection. The patient experienced some light-headedness after the procedure and was observed closely in the room. Brief syncopal episode. Pt was layed back on exam table and feet were elevated, and she began to become more alert immediately. Vitals checked, bp 102/60 and pulse was 62. Pt given water and rebounded nicely. Left office without any issues.   The injection sites were cleaned and dressed as appropriate. Post- injection instructions were provided.    Follow up scheduled for July.

## 2018-04-26 NOTE — Patient Instructions (Signed)
PLEASE FEEL FREE TO CALL OUR OFFICE WITH ANY PROBLEMS OR QUESTIONS ([phone])      

## 2018-04-27 DIAGNOSIS — G510 Bell's palsy: Secondary | ICD-10-CM | POA: Diagnosis not present

## 2018-05-26 ENCOUNTER — Other Ambulatory Visit: Payer: Self-pay | Admitting: Physical Medicine & Rehabilitation

## 2018-05-26 DIAGNOSIS — G8250 Quadriplegia, unspecified: Secondary | ICD-10-CM

## 2018-05-31 ENCOUNTER — Other Ambulatory Visit: Payer: Self-pay | Admitting: Physical Medicine & Rehabilitation

## 2018-05-31 DIAGNOSIS — G610 Guillain-Barre syndrome: Secondary | ICD-10-CM

## 2018-05-31 DIAGNOSIS — M75102 Unspecified rotator cuff tear or rupture of left shoulder, not specified as traumatic: Secondary | ICD-10-CM

## 2018-05-31 DIAGNOSIS — M75101 Unspecified rotator cuff tear or rupture of right shoulder, not specified as traumatic: Secondary | ICD-10-CM

## 2018-05-31 DIAGNOSIS — G8250 Quadriplegia, unspecified: Secondary | ICD-10-CM

## 2018-06-15 DIAGNOSIS — M47816 Spondylosis without myelopathy or radiculopathy, lumbar region: Secondary | ICD-10-CM | POA: Diagnosis not present

## 2018-06-15 DIAGNOSIS — Z79891 Long term (current) use of opiate analgesic: Secondary | ICD-10-CM | POA: Diagnosis not present

## 2018-06-15 DIAGNOSIS — M4316 Spondylolisthesis, lumbar region: Secondary | ICD-10-CM | POA: Diagnosis not present

## 2018-06-15 DIAGNOSIS — G894 Chronic pain syndrome: Secondary | ICD-10-CM | POA: Diagnosis not present

## 2018-06-22 ENCOUNTER — Encounter: Payer: Medicare Other | Attending: Physical Medicine & Rehabilitation | Admitting: Physical Medicine & Rehabilitation

## 2018-06-22 ENCOUNTER — Encounter: Payer: Self-pay | Admitting: Physical Medicine & Rehabilitation

## 2018-06-22 VITALS — BP 127/85 | HR 74 | Resp 14 | Ht 62.0 in | Wt 166.0 lb

## 2018-06-22 DIAGNOSIS — M545 Low back pain: Secondary | ICD-10-CM

## 2018-06-22 DIAGNOSIS — G610 Guillain-Barre syndrome: Secondary | ICD-10-CM

## 2018-06-22 DIAGNOSIS — G8929 Other chronic pain: Secondary | ICD-10-CM

## 2018-06-22 DIAGNOSIS — M6530 Trigger finger, unspecified finger: Secondary | ICD-10-CM | POA: Diagnosis not present

## 2018-06-22 NOTE — Progress Notes (Signed)
Subjective:    Patient ID: Joanne Morrison, female    DOB: [birth year], 67 y.o.   MRN: [id number]  HPI     Pain Inventory Average Pain 5 Pain Right Now 5 My pain is sharp, dull and stabbing  In the last 24 hours, has pain interfered with the following? General activity 10 Relation with others 0 Enjoyment of life 7 What TIME of day is your pain at its worst? varies Sleep (in general) Good  Pain is worse with: walking, bending and standing Pain improves with: therapy/exercise and medication Relief from Meds: 8  Mobility walk without assistance ability to climb steps?  yes do you drive?  yes  Function retired  Neuro/Psych loss of taste or smell  Prior Studies Any changes since last visit?  no  Physicians involved in your care Any changes since last visit?  no   Family History  Problem Relation Age of Onset  . Hypertension Mother   . Dementia Mother   . Lung cancer Mother        smoker  . Osteoporosis Mother   . Heart attack Father 44  . Hypertension Brother   . Pulmonary embolism Brother 60       ?etilology  . Diabetes Maternal Aunt   . Cancer Maternal Uncle        RENAL  . Heart attack Maternal Grandmother 86  . Lung cancer Maternal Grandfather        Ecologist  . Anesthesia problems Neg Hx   . Stroke Neg Hx    Social History   Socioeconomic History  . Marital status: Married    Spouse name: Joanne Morrison Resides  . Number of children: 1  . Years of education: 28  . Highest education level: Not on file  Occupational History  . Not on file  Social Needs  . Financial resource strain: Not on file  . Food insecurity:    Worry: Not on file    Inability: Not on file  . Transportation needs:    Medical: Not on file    Non-medical: Not on file  Tobacco Use  . Smoking status: Never Smoker  . Smokeless tobacco: Never Used  Substance and Sexual Activity  . Alcohol use: No  . Drug use: No  . Sexual activity: Yes    Birth control/protection: None  Lifestyle    . Physical activity:    Days per week: Not on file    Minutes per session: Not on file  . Stress: Not on file  Relationships  . Social connections:    Talks on phone: Not on file    Gets together: Not on file    Attends religious service: Not on file    Active member of club or organization: Not on file    Attends meetings of clubs or organizations: Not on file    Relationship status: Not on file  Other Topics Concern  . Not on file  Social History Narrative   Lives w/ husband   Caffeine use: 2 drinks per month    Right handed   Past Surgical History:  Procedure Laterality Date  . BUNIONECTOMY Left 2004 approx  . CARPAL TUNNEL RELEASE Bilateral right 2014;  left 2007  . COLONOSCOPY W/ POLYPECTOMY  09/24/2009  . CYSTO WITH HYDRODISTENSION [name] 10/15/2016   Procedure: CYSTOSCOPY/HYDRODISTENSION AND MARCAINE INSTILLATION;  Surgeon: Joanne Snare, MD;  Location: [facility];  Service: Urology;  Laterality: [name];  . CYSTO/  HYDRODISTENTION/  INSTILLATION THERAPY  1997 approx  . HYSTEROSCOPY W/D&C  11/01/2004   POLYPECTOMY  . IR GASTROSTOMY TUBE REMOVAL  03/20/2017  . IR GENERIC HISTORICAL  01/26/2017   IR GASTROSTOMY TUBE MOD SED 01/26/2017 Joanne Mariscal, MD [facility]  . LAPAROSCOPIC VAGINAL HYSTERECTOMY WITH SALPINGO OOPHORECTOMY Bilateral 04/20/2012  . TUBAL LIGATION  yrs ago   Past Medical History:  Diagnosis Date  . Anxiety   . Arthritis    back  . Chronic cystitis   . Chronic low back pain   . Depression   . Diverticulosis of colon   . GBS (Guillain-Barre syndrome) ([place])   . Hemorrhoids   . History of colon polyps    09-24-2009  rectal hyperplastic polyp/   and 2016 non-cancerous  . History of endometriosis   . History of panic attacks   . Hyperlipidemia   . Osteopenia   . Pelvic pain   . Sensation of pressure in bladder area   . Wears glasses    BP 127/85 (BP Location: Left Arm, Patient Position: Sitting, Cuff Size: Normal)   Pulse 74   Resp 14    Ht 5\' 2"  (1.575 m)   SpO2 95%   BMI 30.18 kg/m   Opioid Risk Score:   Fall Risk Score:  `1  Depression screen PHQ 2/9  Depression screen [facility] 2/9 04/26/2018 03/04/2017 12/05/2013  Decreased Interest 0 3 0  Down, Depressed, Hopeless 0 3 0  PHQ - 2 Score 0 6 0  Altered sleeping - 2 -  Tired, decreased energy - 3 -  Change in appetite - 0 -  Feeling bad or failure about yourself  - 0 -  Trouble concentrating - 3 -  Moving slowly or fidgety/restless - 3 -  Suicidal thoughts - 0 -  PHQ-9 Score - 17 -  Difficult doing work/chores - Extremely dIfficult -    Review of Systems  Constitutional: Positive for diaphoresis.  HENT: Negative.   Eyes: Negative.   Respiratory: Negative.   Cardiovascular: Negative.   Gastrointestinal: Negative.   Endocrine: Negative.   Genitourinary: Negative.   Musculoskeletal: Positive for arthralgias and back pain.  Skin: Negative.   Allergic/Immunologic: Negative.   Neurological: Negative.   Psychiatric/Behavioral: Negative.        Objective:   Physical Exam General: No acute distress HEENT: EOMI, oral membranes moist. Left lid weight in place. Better eye closure Cards: reg rate  Chest: normal effort Abdomen: Soft, NT, ND Skin: dry, intact Extremities: no edema Musculoskeletal: MILD right dextroscoliosis, right hemi-pelvis elevated. Right lumbar paraspinals taut/prominent, spine rotate clockwise. Some pain with flexion and extension and with palpation of lumbar spine and paraspinals Neurological: She is alert and oriented.  Motor:  LUE: 4+/5 proximally, 4+/5 distally  RUE: 4+/5.   Decreased sensation over left face, toes Normal gait.  Skin: Skin is warm and dry.  Psychiatric: Normal mood and behavior.      Assessment & Plan:  Medical Problem List and Plan: 1. Tetraplegia secondary to Joanne Morrison syndrome.  -continue with HEP  -provided pilates principles/exercises as well.   2. Neuropathic  pain/spasms -improved -baclofen 5-10 mg at night for spasms and sleep 3.Right handed trigger fingers--improved  -voltaren gel -provided trigger finger stretches today            -good results again with recent trigger finger injections 4. 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 Underwent tarorhaphy 02/04/2017. Continue erythromycin ointment for now follow-up ophthalmology services as needed, -continue with plan per optho. Continues to slowly  improve  5. Low back pain, facet arthropathy, mild dextroscoliosis  -reviewed pilates principles  -may use voltaren when needed.   10minutes of face to face patient care time were spent during this visit. Follow up in 6 months. All questions were encouraged and answered.

## 2018-06-22 NOTE — Patient Instructions (Signed)
MAINTAIN ACTIVITY AND WORK ON GOOD POSTURE AND MECHANICS

## 2018-06-28 DIAGNOSIS — H04123 Dry eye syndrome of bilateral lacrimal glands: Secondary | ICD-10-CM | POA: Diagnosis not present

## 2018-06-28 DIAGNOSIS — H02202 Unspecified lagophthalmos right lower eyelid: Secondary | ICD-10-CM | POA: Diagnosis not present

## 2018-07-21 DIAGNOSIS — Z1231 Encounter for screening mammogram for malignant neoplasm of breast: Secondary | ICD-10-CM | POA: Diagnosis not present

## 2018-07-21 DIAGNOSIS — Z1272 Encounter for screening for malignant neoplasm of vagina: Secondary | ICD-10-CM | POA: Diagnosis not present

## 2018-07-21 DIAGNOSIS — R351 Nocturia: Secondary | ICD-10-CM | POA: Diagnosis not present

## 2018-07-21 DIAGNOSIS — Z124 Encounter for screening for malignant neoplasm of cervix: Secondary | ICD-10-CM | POA: Diagnosis not present

## 2018-07-21 DIAGNOSIS — Z6831 Body mass index (BMI) 31.0-31.9, adult: Secondary | ICD-10-CM | POA: Diagnosis not present

## 2018-07-21 DIAGNOSIS — R350 Frequency of micturition: Secondary | ICD-10-CM | POA: Diagnosis not present

## 2018-07-27 DIAGNOSIS — F419 Anxiety disorder, unspecified: Secondary | ICD-10-CM | POA: Diagnosis not present

## 2018-08-18 DIAGNOSIS — M4316 Spondylolisthesis, lumbar region: Secondary | ICD-10-CM | POA: Diagnosis not present

## 2018-08-18 DIAGNOSIS — M47816 Spondylosis without myelopathy or radiculopathy, lumbar region: Secondary | ICD-10-CM | POA: Diagnosis not present

## 2018-08-18 DIAGNOSIS — G894 Chronic pain syndrome: Secondary | ICD-10-CM | POA: Diagnosis not present

## 2018-08-18 DIAGNOSIS — Z79891 Long term (current) use of opiate analgesic: Secondary | ICD-10-CM | POA: Diagnosis not present

## 2018-09-02 DIAGNOSIS — H02051 Trichiasis without entropian right upper eyelid: Secondary | ICD-10-CM | POA: Diagnosis not present

## 2018-09-02 DIAGNOSIS — G510 Bell's palsy: Secondary | ICD-10-CM | POA: Diagnosis not present

## 2018-09-09 DIAGNOSIS — H04122 Dry eye syndrome of left lacrimal gland: Secondary | ICD-10-CM | POA: Diagnosis not present

## 2018-09-09 DIAGNOSIS — H04121 Dry eye syndrome of right lacrimal gland: Secondary | ICD-10-CM | POA: Diagnosis not present

## 2018-09-14 ENCOUNTER — Other Ambulatory Visit: Payer: Self-pay | Admitting: Physical Medicine & Rehabilitation

## 2018-09-14 DIAGNOSIS — G8250 Quadriplegia, unspecified: Secondary | ICD-10-CM

## 2018-10-13 DIAGNOSIS — I10 Essential (primary) hypertension: Secondary | ICD-10-CM | POA: Diagnosis not present

## 2018-10-20 DIAGNOSIS — Z0000 Encounter for general adult medical examination without abnormal findings: Secondary | ICD-10-CM | POA: Diagnosis not present

## 2018-10-20 DIAGNOSIS — E7849 Other hyperlipidemia: Secondary | ICD-10-CM | POA: Diagnosis not present

## 2018-10-20 DIAGNOSIS — M1990 Unspecified osteoarthritis, unspecified site: Secondary | ICD-10-CM | POA: Diagnosis not present

## 2018-10-20 DIAGNOSIS — M5489 Other dorsalgia: Secondary | ICD-10-CM | POA: Diagnosis not present

## 2018-10-20 DIAGNOSIS — I10 Essential (primary) hypertension: Secondary | ICD-10-CM | POA: Diagnosis not present

## 2018-10-20 DIAGNOSIS — G510 Bell's palsy: Secondary | ICD-10-CM | POA: Diagnosis not present

## 2018-10-20 DIAGNOSIS — G610 Guillain-Barre syndrome: Secondary | ICD-10-CM | POA: Diagnosis not present

## 2018-10-20 DIAGNOSIS — G4709 Other insomnia: Secondary | ICD-10-CM | POA: Diagnosis not present

## 2018-10-20 DIAGNOSIS — Z6831 Body mass index (BMI) 31.0-31.9, adult: Secondary | ICD-10-CM | POA: Diagnosis not present

## 2018-10-20 DIAGNOSIS — D126 Benign neoplasm of colon, unspecified: Secondary | ICD-10-CM | POA: Diagnosis not present

## 2018-10-20 DIAGNOSIS — F418 Other specified anxiety disorders: Secondary | ICD-10-CM | POA: Diagnosis not present

## 2018-10-20 DIAGNOSIS — Z1389 Encounter for screening for other disorder: Secondary | ICD-10-CM | POA: Diagnosis not present

## 2018-10-25 DIAGNOSIS — M47816 Spondylosis without myelopathy or radiculopathy, lumbar region: Secondary | ICD-10-CM | POA: Diagnosis not present

## 2018-10-25 DIAGNOSIS — M4316 Spondylolisthesis, lumbar region: Secondary | ICD-10-CM | POA: Diagnosis not present

## 2018-10-25 DIAGNOSIS — G894 Chronic pain syndrome: Secondary | ICD-10-CM | POA: Diagnosis not present

## 2018-10-25 DIAGNOSIS — Z79891 Long term (current) use of opiate analgesic: Secondary | ICD-10-CM | POA: Diagnosis not present

## 2018-10-28 DIAGNOSIS — H04123 Dry eye syndrome of bilateral lacrimal glands: Secondary | ICD-10-CM | POA: Diagnosis not present

## 2018-10-29 DIAGNOSIS — Z1212 Encounter for screening for malignant neoplasm of rectum: Secondary | ICD-10-CM | POA: Diagnosis not present

## 2018-11-02 DIAGNOSIS — M859 Disorder of bone density and structure, unspecified: Secondary | ICD-10-CM | POA: Diagnosis not present

## 2018-12-21 ENCOUNTER — Encounter: Payer: Self-pay | Admitting: Physical Medicine & Rehabilitation

## 2018-12-21 ENCOUNTER — Encounter: Payer: Medicare Other | Attending: Physical Medicine & Rehabilitation | Admitting: Physical Medicine & Rehabilitation

## 2018-12-21 VITALS — BP 129/87 | HR 66 | Ht 62.0 in | Wt 170.6 lb

## 2018-12-21 DIAGNOSIS — M47816 Spondylosis without myelopathy or radiculopathy, lumbar region: Secondary | ICD-10-CM

## 2018-12-21 DIAGNOSIS — G610 Guillain-Barre syndrome: Secondary | ICD-10-CM | POA: Diagnosis not present

## 2018-12-21 DIAGNOSIS — M6530 Trigger finger, unspecified finger: Secondary | ICD-10-CM | POA: Diagnosis not present

## 2018-12-21 NOTE — Patient Instructions (Signed)
PLEASE FEEL FREE TO CALL OUR OFFICE WITH ANY PROBLEMS OR QUESTIONS ([phone])      

## 2018-12-21 NOTE — Progress Notes (Signed)
Subjective:    Patient ID: Joanne Morrison, female    DOB: 1951-09-29, 68 y.o.   MRN: [id number]  HPI   Joanne Morrison is here in follow up of her GBS. She has been doing well. She still having dryness in her left eye, but it seems manageable for the most part. The weight was removed from her lid by optho.   The diclofenac proved very helpful for her back.  She is saw her anesthesiologist who changed her to the combination form with misoprostol.  She takes the medication twice daily.  She is also working on some stretching.  She often feels most tight when she first gets up in the morning.  Otherwise things are going well.  She is active at home.  She is part of the GBS support group.   Pain Inventory Average Pain 4 Pain Right Now 4 My pain is dull and aching  In the last 24 hours, has pain interfered with the following? General activity 2 Relation with others 0 Enjoyment of life 3 What TIME of day is your pain at its worst? evening Sleep (in general) Fair  Pain is worse with: . Pain improves with: . Relief from Meds: .  Mobility ability to climb steps?  yes do you drive?  yes  Function retired  Neuro/Psych bladder control problems  Prior Studies Any changes since last visit?  no  Physicians involved in your care Any changes since last visit?  no   Family History  Problem Relation Age of Onset  . Hypertension Mother   . Dementia Mother   . Lung cancer Mother        smoker  . Osteoporosis Mother   . Heart attack Father 47  . Hypertension Brother   . Pulmonary embolism Brother 60       ?etilology  . Diabetes Maternal Aunt   . Cancer Maternal Uncle        RENAL  . Heart attack Maternal Grandmother 86  . Lung cancer Maternal Grandfather        Ecologist  . Anesthesia problems Neg Hx   . Stroke Neg Hx    Social History   Socioeconomic History  . Marital status: Married    Spouse name: [name] Resides  . Number of children: 1  . Years of education: 75  .  Highest education level: Not on file  Occupational History  . Not on file  Social Needs  . Financial resource strain: Not on file  . Food insecurity:    Worry: Not on file    Inability: Not on file  . Transportation needs:    Medical: Not on file    Non-medical: Not on file  Tobacco Use  . Smoking status: Never Smoker  . Smokeless tobacco: Never Used  Substance and Sexual Activity  . Alcohol use: No  . Drug use: No  . Sexual activity: Yes    Birth control/protection: None  Lifestyle  . Physical activity:    Days per week: Not on file    Minutes per session: Not on file  . Stress: Not on file  Relationships  . Social connections:    Talks on phone: Not on file    Gets together: Not on file    Attends religious service: Not on file    Active member of club or organization: Not on file    Attends meetings of clubs or organizations: Not on file    Relationship status: Not on file  Other Topics Concern  . Not on file  Social History Narrative   Lives w/ husband   Caffeine use: 2 drinks per month    Right handed   Past Surgical History:  Procedure Laterality Date  . BUNIONECTOMY Left 2004 approx  . CARPAL TUNNEL RELEASE Bilateral right 2014;  left 2007  . COLONOSCOPY W/ POLYPECTOMY  09/24/2009  . CYSTO WITH HYDRODISTENSION [name] 10/15/2016   Procedure: CYSTOSCOPY/HYDRODISTENSION AND MARCAINE INSTILLATION;  Surgeon: Rana Snare, MD;  Location: [facility];  Service: Urology;  Laterality: [name];  . CYSTO/  HYDRODISTENTION/  INSTILLATION THERAPY  1997 approx  . HYSTEROSCOPY W/D&C  11/01/2004   POLYPECTOMY  . IR GASTROSTOMY TUBE REMOVAL  03/20/2017  . IR GENERIC HISTORICAL  01/26/2017   IR GASTROSTOMY TUBE MOD SED 01/26/2017 Sandi Mariscal, MD [facility]  . LAPAROSCOPIC VAGINAL HYSTERECTOMY WITH SALPINGO OOPHORECTOMY Bilateral 04/20/2012  . TUBAL LIGATION  yrs ago   Past Medical History:  Diagnosis Date  . Anxiety   . Arthritis    back  . Chronic cystitis   .  Chronic low back pain   . Depression   . Diverticulosis of colon   . GBS (Guillain-Barre syndrome) ([place])   . Hemorrhoids   . History of colon polyps    09-24-2009  rectal hyperplastic polyp/   and 2016 non-cancerous  . History of endometriosis   . History of panic attacks   . Hyperlipidemia   . Osteopenia   . Pelvic pain   . Sensation of pressure in bladder area   . Wears glasses    BP 129/87   Pulse 66   Ht 5\' 2"  (1.575 m)   Wt 170 lb 9.6 oz (77.4 kg)   SpO2 98%   BMI 31.20 kg/m   Opioid Risk Score:   Fall Risk Score:  `1  Depression screen PHQ 2/9  Depression screen [facility] 2/9 04/26/2018 03/04/2017 12/05/2013  Decreased Interest 0 3 0  Down, Depressed, Hopeless 0 3 0  PHQ - 2 Score 0 6 0  Altered sleeping - 2 -  Tired, decreased energy - 3 -  Change in appetite - 0 -  Feeling bad or failure about yourself  - 0 -  Trouble concentrating - 3 -  Moving slowly or fidgety/restless - 3 -  Suicidal thoughts - 0 -  PHQ-9 Score - 17 -  Difficult doing work/chores - Extremely dIfficult -     Review of Systems  Constitutional: Positive for diaphoresis.  HENT: Negative.   Eyes: Negative.   Respiratory: Negative.   Cardiovascular: Negative.   Gastrointestinal: Negative.   Endocrine: Negative.   Genitourinary: Positive for difficulty urinating.  Musculoskeletal: Negative.   Skin: Negative.   Allergic/Immunologic: Negative.   Neurological: Negative.   Hematological: Negative.   Psychiatric/Behavioral: Negative.   All other systems reviewed and are negative.      Objective:   Physical Exam General: No acute distress HEENT: EOMI, oral membranes moist Cards: reg rate  Chest: normal effort Abdomen: Soft, NT, ND Skin: dry, intact Extremities: no edema  Musculoskeletal: MILD right dextroscoliosis, right hemi-pelvis elevated. Right lumbar paraspinals taut/prominent, spine rotate clockwise. Some pain with flexion and extension and with palpation of lumbar spine and  paraspinals Neurological: She is alert and oriented.  Motor:  LUE: 4+/5 proximally, 4+/5 distally  RUE: 4+/5.   Mild sensory loss over left face and distally in toes, lesser extent fingers.  Normal gait.  Skin: Skin is warm and dry.  Psychiatric:pleasant and  up beat.       Assessment & Plan:  Medical Problem List and Plan: 1. 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  -continue with HEP as we've discussed. Doing a great job!.   2. Neuropathic pain/spasms -improved -baclofen 5-10 mg at night for spasms and sleep 3.Right handed trigger fingers--improved  -voltaren gel -continue trigger finger stretches today -no injections needed today 4. 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 Underwent tarorhaphy 02/04/2017. Continue erythromycin ointment for now follow-up ophthalmology services as needed, -continue with plan per optho. Continues to slowly improve  5. Low back pain, facet arthropathy, mild dextroscoliosis          -discussed pilates principles          -  voltaren scheduled/ receiving combo with misoprostol. Not having any GI side effects at this point. Pain mgt prescribing  -lumbar facet exercises were provided   30minutes of face to face patient care time were spent during this visit.follow up with me prn. .All questions were encouraged and answered.

## 2018-12-23 DIAGNOSIS — M4316 Spondylolisthesis, lumbar region: Secondary | ICD-10-CM | POA: Diagnosis not present

## 2018-12-23 DIAGNOSIS — Z79891 Long term (current) use of opiate analgesic: Secondary | ICD-10-CM | POA: Diagnosis not present

## 2018-12-23 DIAGNOSIS — M47816 Spondylosis without myelopathy or radiculopathy, lumbar region: Secondary | ICD-10-CM | POA: Diagnosis not present

## 2018-12-23 DIAGNOSIS — G894 Chronic pain syndrome: Secondary | ICD-10-CM | POA: Diagnosis not present

## 2018-12-29 DIAGNOSIS — F419 Anxiety disorder, unspecified: Secondary | ICD-10-CM | POA: Diagnosis not present

## 2019-01-28 DIAGNOSIS — H04122 Dry eye syndrome of left lacrimal gland: Secondary | ICD-10-CM | POA: Diagnosis not present

## 2019-01-28 DIAGNOSIS — H04121 Dry eye syndrome of right lacrimal gland: Secondary | ICD-10-CM | POA: Diagnosis not present

## 2019-02-15 DIAGNOSIS — Z6831 Body mass index (BMI) 31.0-31.9, adult: Secondary | ICD-10-CM | POA: Diagnosis not present

## 2019-02-15 DIAGNOSIS — I10 Essential (primary) hypertension: Secondary | ICD-10-CM | POA: Diagnosis not present

## 2019-02-15 DIAGNOSIS — L309 Dermatitis, unspecified: Secondary | ICD-10-CM | POA: Diagnosis not present

## 2019-02-17 DIAGNOSIS — M4316 Spondylolisthesis, lumbar region: Secondary | ICD-10-CM | POA: Diagnosis not present

## 2019-02-17 DIAGNOSIS — Z79891 Long term (current) use of opiate analgesic: Secondary | ICD-10-CM | POA: Diagnosis not present

## 2019-02-17 DIAGNOSIS — G894 Chronic pain syndrome: Secondary | ICD-10-CM | POA: Diagnosis not present

## 2019-02-17 DIAGNOSIS — M47816 Spondylosis without myelopathy or radiculopathy, lumbar region: Secondary | ICD-10-CM | POA: Diagnosis not present

## 2019-03-03 DIAGNOSIS — T1512XA Foreign body in conjunctival sac, left eye, initial encounter: Secondary | ICD-10-CM | POA: Diagnosis not present

## 2019-03-03 DIAGNOSIS — H02054 Trichiasis without entropian left upper eyelid: Secondary | ICD-10-CM | POA: Diagnosis not present

## 2019-03-29 DIAGNOSIS — H02055 Trichiasis without entropian left lower eyelid: Secondary | ICD-10-CM | POA: Diagnosis not present

## 2019-04-14 DIAGNOSIS — M47816 Spondylosis without myelopathy or radiculopathy, lumbar region: Secondary | ICD-10-CM | POA: Diagnosis not present

## 2019-04-14 DIAGNOSIS — M4316 Spondylolisthesis, lumbar region: Secondary | ICD-10-CM | POA: Diagnosis not present

## 2019-04-14 DIAGNOSIS — G894 Chronic pain syndrome: Secondary | ICD-10-CM | POA: Diagnosis not present

## 2019-04-14 DIAGNOSIS — Z79891 Long term (current) use of opiate analgesic: Secondary | ICD-10-CM | POA: Diagnosis not present

## 2019-04-27 DIAGNOSIS — H02054 Trichiasis without entropian left upper eyelid: Secondary | ICD-10-CM | POA: Diagnosis not present

## 2019-04-27 DIAGNOSIS — H02052 Trichiasis without entropian right lower eyelid: Secondary | ICD-10-CM | POA: Diagnosis not present

## 2019-06-09 DIAGNOSIS — Z79891 Long term (current) use of opiate analgesic: Secondary | ICD-10-CM | POA: Diagnosis not present

## 2019-06-09 DIAGNOSIS — M47816 Spondylosis without myelopathy or radiculopathy, lumbar region: Secondary | ICD-10-CM | POA: Diagnosis not present

## 2019-06-09 DIAGNOSIS — G894 Chronic pain syndrome: Secondary | ICD-10-CM | POA: Diagnosis not present

## 2019-06-09 DIAGNOSIS — M4316 Spondylolisthesis, lumbar region: Secondary | ICD-10-CM | POA: Diagnosis not present

## 2019-06-24 DIAGNOSIS — G4700 Insomnia, unspecified: Secondary | ICD-10-CM | POA: Diagnosis not present

## 2019-07-05 DIAGNOSIS — H5203 Hypermetropia, bilateral: Secondary | ICD-10-CM | POA: Diagnosis not present

## 2019-07-05 DIAGNOSIS — H04123 Dry eye syndrome of bilateral lacrimal glands: Secondary | ICD-10-CM | POA: Diagnosis not present

## 2019-07-05 DIAGNOSIS — H02055 Trichiasis without entropian left lower eyelid: Secondary | ICD-10-CM | POA: Diagnosis not present

## 2019-07-26 DIAGNOSIS — F410 Panic disorder [episodic paroxysmal anxiety] without agoraphobia: Secondary | ICD-10-CM | POA: Diagnosis not present

## 2019-07-26 DIAGNOSIS — G4700 Insomnia, unspecified: Secondary | ICD-10-CM | POA: Diagnosis not present

## 2019-07-26 DIAGNOSIS — F419 Anxiety disorder, unspecified: Secondary | ICD-10-CM | POA: Diagnosis not present

## 2019-08-03 DIAGNOSIS — Z01419 Encounter for gynecological examination (general) (routine) without abnormal findings: Secondary | ICD-10-CM | POA: Diagnosis not present

## 2019-08-03 DIAGNOSIS — Z1231 Encounter for screening mammogram for malignant neoplasm of breast: Secondary | ICD-10-CM | POA: Diagnosis not present

## 2019-08-03 DIAGNOSIS — Z6831 Body mass index (BMI) 31.0-31.9, adult: Secondary | ICD-10-CM | POA: Diagnosis not present

## 2019-08-08 DIAGNOSIS — F419 Anxiety disorder, unspecified: Secondary | ICD-10-CM | POA: Diagnosis not present

## 2019-08-08 DIAGNOSIS — F410 Panic disorder [episodic paroxysmal anxiety] without agoraphobia: Secondary | ICD-10-CM | POA: Diagnosis not present

## 2019-08-08 DIAGNOSIS — F329 Major depressive disorder, single episode, unspecified: Secondary | ICD-10-CM | POA: Diagnosis not present

## 2019-08-09 DIAGNOSIS — M47816 Spondylosis without myelopathy or radiculopathy, lumbar region: Secondary | ICD-10-CM | POA: Diagnosis not present

## 2019-08-09 DIAGNOSIS — M4316 Spondylolisthesis, lumbar region: Secondary | ICD-10-CM | POA: Diagnosis not present

## 2019-08-09 DIAGNOSIS — Z79891 Long term (current) use of opiate analgesic: Secondary | ICD-10-CM | POA: Diagnosis not present

## 2019-08-09 DIAGNOSIS — G894 Chronic pain syndrome: Secondary | ICD-10-CM | POA: Diagnosis not present

## 2019-08-18 DIAGNOSIS — L309 Dermatitis, unspecified: Secondary | ICD-10-CM | POA: Diagnosis not present

## 2019-08-18 DIAGNOSIS — F329 Major depressive disorder, single episode, unspecified: Secondary | ICD-10-CM | POA: Diagnosis not present

## 2019-08-18 DIAGNOSIS — M549 Dorsalgia, unspecified: Secondary | ICD-10-CM | POA: Diagnosis not present

## 2019-08-18 DIAGNOSIS — I10 Essential (primary) hypertension: Secondary | ICD-10-CM | POA: Diagnosis not present

## 2019-08-18 DIAGNOSIS — R002 Palpitations: Secondary | ICD-10-CM | POA: Diagnosis not present

## 2019-08-18 DIAGNOSIS — F410 Panic disorder [episodic paroxysmal anxiety] without agoraphobia: Secondary | ICD-10-CM | POA: Diagnosis not present

## 2019-08-18 DIAGNOSIS — M8580 Other specified disorders of bone density and structure, unspecified site: Secondary | ICD-10-CM | POA: Diagnosis not present

## 2019-08-31 DIAGNOSIS — R002 Palpitations: Secondary | ICD-10-CM | POA: Diagnosis not present

## 2019-08-31 DIAGNOSIS — I10 Essential (primary) hypertension: Secondary | ICD-10-CM | POA: Diagnosis not present

## 2019-09-06 DIAGNOSIS — F329 Major depressive disorder, single episode, unspecified: Secondary | ICD-10-CM | POA: Diagnosis not present

## 2019-09-06 DIAGNOSIS — F410 Panic disorder [episodic paroxysmal anxiety] without agoraphobia: Secondary | ICD-10-CM | POA: Diagnosis not present

## 2019-09-08 DIAGNOSIS — H2513 Age-related nuclear cataract, bilateral: Secondary | ICD-10-CM | POA: Diagnosis not present

## 2019-09-08 DIAGNOSIS — H04123 Dry eye syndrome of bilateral lacrimal glands: Secondary | ICD-10-CM | POA: Diagnosis not present

## 2019-09-14 ENCOUNTER — Other Ambulatory Visit: Payer: Self-pay | Admitting: Internal Medicine

## 2019-09-14 DIAGNOSIS — R110 Nausea: Secondary | ICD-10-CM | POA: Diagnosis not present

## 2019-09-14 DIAGNOSIS — F419 Anxiety disorder, unspecified: Secondary | ICD-10-CM | POA: Diagnosis not present

## 2019-09-14 DIAGNOSIS — R1013 Epigastric pain: Secondary | ICD-10-CM

## 2019-09-14 DIAGNOSIS — I10 Essential (primary) hypertension: Secondary | ICD-10-CM | POA: Diagnosis not present

## 2019-09-19 ENCOUNTER — Ambulatory Visit
Admission: RE | Admit: 2019-09-19 | Discharge: 2019-09-19 | Disposition: A | Discharge status: Home / Self Care | Payer: Medicare Other | Source: Ambulatory Visit | Attending: Internal Medicine | Admitting: Internal Medicine

## 2019-09-19 DIAGNOSIS — R1013 Epigastric pain: Secondary | ICD-10-CM

## 2019-09-19 DIAGNOSIS — R110 Nausea: Secondary | ICD-10-CM | POA: Diagnosis not present

## 2019-09-28 DIAGNOSIS — F410 Panic disorder [episodic paroxysmal anxiety] without agoraphobia: Secondary | ICD-10-CM | POA: Diagnosis not present

## 2019-09-28 DIAGNOSIS — F329 Major depressive disorder, single episode, unspecified: Secondary | ICD-10-CM | POA: Diagnosis not present

## 2019-10-06 DIAGNOSIS — H04123 Dry eye syndrome of bilateral lacrimal glands: Secondary | ICD-10-CM | POA: Diagnosis not present

## 2019-10-06 DIAGNOSIS — H2513 Age-related nuclear cataract, bilateral: Secondary | ICD-10-CM | POA: Diagnosis not present

## 2019-10-20 DIAGNOSIS — F410 Panic disorder [episodic paroxysmal anxiety] without agoraphobia: Secondary | ICD-10-CM | POA: Diagnosis not present

## 2019-10-20 DIAGNOSIS — I10 Essential (primary) hypertension: Secondary | ICD-10-CM | POA: Diagnosis not present

## 2019-10-20 DIAGNOSIS — G610 Guillain-Barre syndrome: Secondary | ICD-10-CM | POA: Diagnosis not present

## 2019-10-20 DIAGNOSIS — G510 Bell's palsy: Secondary | ICD-10-CM | POA: Diagnosis not present

## 2019-10-20 DIAGNOSIS — R002 Palpitations: Secondary | ICD-10-CM | POA: Diagnosis not present

## 2019-10-20 DIAGNOSIS — E785 Hyperlipidemia, unspecified: Secondary | ICD-10-CM | POA: Diagnosis not present

## 2019-10-20 DIAGNOSIS — M8580 Other specified disorders of bone density and structure, unspecified site: Secondary | ICD-10-CM | POA: Diagnosis not present

## 2019-10-20 DIAGNOSIS — F329 Major depressive disorder, single episode, unspecified: Secondary | ICD-10-CM | POA: Diagnosis not present

## 2019-11-01 DIAGNOSIS — F410 Panic disorder [episodic paroxysmal anxiety] without agoraphobia: Secondary | ICD-10-CM | POA: Diagnosis not present

## 2019-11-01 DIAGNOSIS — F329 Major depressive disorder, single episode, unspecified: Secondary | ICD-10-CM | POA: Diagnosis not present

## 2019-12-06 DIAGNOSIS — I10 Essential (primary) hypertension: Secondary | ICD-10-CM | POA: Diagnosis not present

## 2019-12-06 DIAGNOSIS — M859 Disorder of bone density and structure, unspecified: Secondary | ICD-10-CM | POA: Diagnosis not present

## 2019-12-06 DIAGNOSIS — E7849 Other hyperlipidemia: Secondary | ICD-10-CM | POA: Diagnosis not present

## 2019-12-15 ENCOUNTER — Other Ambulatory Visit: Payer: Self-pay | Admitting: Internal Medicine

## 2019-12-15 DIAGNOSIS — E785 Hyperlipidemia, unspecified: Secondary | ICD-10-CM

## 2019-12-19 DIAGNOSIS — R82998 Other abnormal findings in urine: Secondary | ICD-10-CM | POA: Diagnosis not present

## 2019-12-20 DIAGNOSIS — K921 Melena: Secondary | ICD-10-CM | POA: Diagnosis not present

## 2020-01-20 DIAGNOSIS — K639 Disease of intestine, unspecified: Secondary | ICD-10-CM | POA: Diagnosis not present

## 2020-01-20 DIAGNOSIS — R195 Other fecal abnormalities: Secondary | ICD-10-CM | POA: Diagnosis not present

## 2020-01-26 DIAGNOSIS — H04123 Dry eye syndrome of bilateral lacrimal glands: Secondary | ICD-10-CM | POA: Diagnosis not present

## 2020-01-26 DIAGNOSIS — H02052 Trichiasis without entropian right lower eyelid: Secondary | ICD-10-CM | POA: Diagnosis not present

## 2020-02-02 DIAGNOSIS — Z1159 Encounter for screening for other viral diseases: Secondary | ICD-10-CM | POA: Diagnosis not present

## 2020-02-07 DIAGNOSIS — R195 Other fecal abnormalities: Secondary | ICD-10-CM | POA: Diagnosis not present

## 2020-02-07 DIAGNOSIS — R194 Change in bowel habit: Secondary | ICD-10-CM | POA: Diagnosis not present

## 2020-02-07 DIAGNOSIS — K5730 Diverticulosis of large intestine without perforation or abscess without bleeding: Secondary | ICD-10-CM | POA: Diagnosis not present

## 2020-02-07 DIAGNOSIS — K648 Other hemorrhoids: Secondary | ICD-10-CM | POA: Diagnosis not present

## 2020-02-10 DIAGNOSIS — R195 Other fecal abnormalities: Secondary | ICD-10-CM | POA: Diagnosis not present

## 2020-02-10 DIAGNOSIS — R194 Change in bowel habit: Secondary | ICD-10-CM | POA: Diagnosis not present

## 2020-03-30 DIAGNOSIS — R197 Diarrhea, unspecified: Secondary | ICD-10-CM | POA: Diagnosis not present

## 2020-03-30 DIAGNOSIS — Z8371 Family history of colonic polyps: Secondary | ICD-10-CM | POA: Diagnosis not present

## 2020-04-03 DIAGNOSIS — Z1152 Encounter for screening for COVID-19: Secondary | ICD-10-CM | POA: Diagnosis not present

## 2020-04-03 DIAGNOSIS — J029 Acute pharyngitis, unspecified: Secondary | ICD-10-CM | POA: Diagnosis not present

## 2020-04-03 DIAGNOSIS — J329 Chronic sinusitis, unspecified: Secondary | ICD-10-CM | POA: Diagnosis not present

## 2020-04-03 DIAGNOSIS — R05 Cough: Secondary | ICD-10-CM | POA: Diagnosis not present

## 2020-04-03 DIAGNOSIS — G610 Guillain-Barre syndrome: Secondary | ICD-10-CM | POA: Diagnosis not present

## 2020-04-09 DIAGNOSIS — R197 Diarrhea, unspecified: Secondary | ICD-10-CM | POA: Diagnosis not present

## 2020-04-19 DIAGNOSIS — R197 Diarrhea, unspecified: Secondary | ICD-10-CM | POA: Diagnosis not present

## 2020-05-03 DIAGNOSIS — G894 Chronic pain syndrome: Secondary | ICD-10-CM | POA: Diagnosis not present

## 2020-05-03 DIAGNOSIS — M47816 Spondylosis without myelopathy or radiculopathy, lumbar region: Secondary | ICD-10-CM | POA: Diagnosis not present

## 2020-05-03 DIAGNOSIS — Z79891 Long term (current) use of opiate analgesic: Secondary | ICD-10-CM | POA: Diagnosis not present

## 2020-05-03 DIAGNOSIS — M4316 Spondylolisthesis, lumbar region: Secondary | ICD-10-CM | POA: Diagnosis not present

## 2020-05-08 DIAGNOSIS — N760 Acute vaginitis: Secondary | ICD-10-CM | POA: Diagnosis not present

## 2020-05-22 ENCOUNTER — Other Ambulatory Visit: Payer: Medicare Other

## 2020-06-05 ENCOUNTER — Ambulatory Visit
Admission: RE | Admit: 2020-06-05 | Discharge: 2020-06-05 | Disposition: A | Discharge status: Home / Self Care | Payer: No Typology Code available for payment source | Source: Ambulatory Visit | Attending: Internal Medicine | Admitting: Internal Medicine

## 2020-06-05 DIAGNOSIS — E785 Hyperlipidemia, unspecified: Secondary | ICD-10-CM

## 2020-07-27 DIAGNOSIS — H04123 Dry eye syndrome of bilateral lacrimal glands: Secondary | ICD-10-CM | POA: Diagnosis not present

## 2020-07-27 DIAGNOSIS — H5310 Unspecified subjective visual disturbances: Secondary | ICD-10-CM | POA: Diagnosis not present

## 2020-08-02 DIAGNOSIS — G894 Chronic pain syndrome: Secondary | ICD-10-CM | POA: Diagnosis not present

## 2020-08-02 DIAGNOSIS — M47816 Spondylosis without myelopathy or radiculopathy, lumbar region: Secondary | ICD-10-CM | POA: Diagnosis not present

## 2020-08-02 DIAGNOSIS — Z79891 Long term (current) use of opiate analgesic: Secondary | ICD-10-CM | POA: Diagnosis not present

## 2020-08-02 DIAGNOSIS — M4316 Spondylolisthesis, lumbar region: Secondary | ICD-10-CM | POA: Diagnosis not present

## 2020-08-03 ENCOUNTER — Ambulatory Visit: Payer: Medicare Other | Attending: Internal Medicine

## 2020-08-03 DIAGNOSIS — Z23 Encounter for immunization: Secondary | ICD-10-CM

## 2020-08-03 NOTE — Progress Notes (Signed)
   Covid-19 Vaccination Clinic  Name:  Joanne Morrison    MRN: [id number] DOB: 03/15/1951  08/03/2020  Ms. Joanne Morrison was observed post Covid-19 immunization for 15 minutes without incident. She was provided with Vaccine Information Sheet and instruction to access the V-Safe system.   Ms. Joanne Morrison was instructed to call 911 with any severe reactions post vaccine: Marland Kitchen Difficulty breathing  . Swelling of face and throat  . A fast heartbeat  . A bad rash all over body  . Dizziness and weakness   Immunizations Administered    Name Date Dose VIS Date Route   Pfizer COVID-19 Vaccine 08/03/2020 10:43 AM 0.3 mL 02/08/2019 Intramuscular   Manufacturer: [place]   Lot: [id number]   [place]: [address]-5

## 2020-08-24 ENCOUNTER — Ambulatory Visit: Payer: Self-pay | Attending: Internal Medicine

## 2020-08-24 DIAGNOSIS — Z23 Encounter for immunization: Secondary | ICD-10-CM

## 2020-08-24 NOTE — Progress Notes (Signed)
° °  Covid-19 Vaccination Clinic  Name:  Eugena Rhue    MRN: [id number] DOB: 02-02-1951  08/24/2020  Ms. Tallman was observed post Covid-19 immunization for 15 minutes without incident. She was provided with Vaccine Information Sheet and instruction to access the V-Safe system.   Ms. Coccia was instructed to call 911 with any severe reactions post vaccine:  Difficulty breathing   Swelling of face and throat   A fast heartbeat   A bad rash all over body   Dizziness and weakness   Immunizations Administered    Name Date Dose VIS Date Route   Pfizer COVID-19 Vaccine 08/24/2020 10:39 AM 0.3 mL 02/08/2019 Intramuscular   Manufacturer: [place]   Lot: [id number]   [place]: [id number]

## 2020-10-16 DIAGNOSIS — Z6833 Body mass index (BMI) 33.0-33.9, adult: Secondary | ICD-10-CM | POA: Diagnosis not present

## 2020-10-16 DIAGNOSIS — Z1272 Encounter for screening for malignant neoplasm of vagina: Secondary | ICD-10-CM | POA: Diagnosis not present

## 2020-10-16 DIAGNOSIS — Z1231 Encounter for screening mammogram for malignant neoplasm of breast: Secondary | ICD-10-CM | POA: Diagnosis not present

## 2020-10-16 DIAGNOSIS — Z90710 Acquired absence of both cervix and uterus: Secondary | ICD-10-CM | POA: Diagnosis not present

## 2020-10-16 DIAGNOSIS — Z124 Encounter for screening for malignant neoplasm of cervix: Secondary | ICD-10-CM | POA: Diagnosis not present

## 2020-11-07 DIAGNOSIS — M47816 Spondylosis without myelopathy or radiculopathy, lumbar region: Secondary | ICD-10-CM | POA: Diagnosis not present

## 2020-11-07 DIAGNOSIS — Z79891 Long term (current) use of opiate analgesic: Secondary | ICD-10-CM | POA: Diagnosis not present

## 2020-11-07 DIAGNOSIS — G894 Chronic pain syndrome: Secondary | ICD-10-CM | POA: Diagnosis not present

## 2020-11-07 DIAGNOSIS — M4316 Spondylolisthesis, lumbar region: Secondary | ICD-10-CM | POA: Diagnosis not present

## 2021-01-10 DIAGNOSIS — M8589 Other specified disorders of bone density and structure, multiple sites: Secondary | ICD-10-CM | POA: Diagnosis not present

## 2021-01-10 DIAGNOSIS — E785 Hyperlipidemia, unspecified: Secondary | ICD-10-CM | POA: Diagnosis not present

## 2021-01-10 DIAGNOSIS — I10 Essential (primary) hypertension: Secondary | ICD-10-CM | POA: Diagnosis not present

## 2021-01-10 DIAGNOSIS — M1990 Unspecified osteoarthritis, unspecified site: Secondary | ICD-10-CM | POA: Diagnosis not present

## 2021-01-17 DIAGNOSIS — G510 Bell's palsy: Secondary | ICD-10-CM | POA: Diagnosis not present

## 2021-01-17 DIAGNOSIS — R946 Abnormal results of thyroid function studies: Secondary | ICD-10-CM | POA: Diagnosis not present

## 2021-01-17 DIAGNOSIS — E785 Hyperlipidemia, unspecified: Secondary | ICD-10-CM | POA: Diagnosis not present

## 2021-01-17 DIAGNOSIS — R82998 Other abnormal findings in urine: Secondary | ICD-10-CM | POA: Diagnosis not present

## 2021-01-17 DIAGNOSIS — I700 Atherosclerosis of aorta: Secondary | ICD-10-CM | POA: Diagnosis not present

## 2021-01-17 DIAGNOSIS — Z1212 Encounter for screening for malignant neoplasm of rectum: Secondary | ICD-10-CM | POA: Diagnosis not present

## 2021-01-17 DIAGNOSIS — I10 Essential (primary) hypertension: Secondary | ICD-10-CM | POA: Diagnosis not present

## 2021-01-17 DIAGNOSIS — Z0000 Encounter for general adult medical examination without abnormal findings: Secondary | ICD-10-CM | POA: Diagnosis not present

## 2021-01-17 DIAGNOSIS — M8580 Other specified disorders of bone density and structure, unspecified site: Secondary | ICD-10-CM | POA: Diagnosis not present

## 2021-01-17 DIAGNOSIS — K760 Fatty (change of) liver, not elsewhere classified: Secondary | ICD-10-CM | POA: Diagnosis not present

## 2021-01-17 DIAGNOSIS — M1990 Unspecified osteoarthritis, unspecified site: Secondary | ICD-10-CM | POA: Diagnosis not present

## 2021-01-29 DIAGNOSIS — H25013 Cortical age-related cataract, bilateral: Secondary | ICD-10-CM | POA: Diagnosis not present

## 2021-01-29 DIAGNOSIS — H04123 Dry eye syndrome of bilateral lacrimal glands: Secondary | ICD-10-CM | POA: Diagnosis not present

## 2021-02-06 DIAGNOSIS — Z79891 Long term (current) use of opiate analgesic: Secondary | ICD-10-CM | POA: Diagnosis not present

## 2021-02-06 DIAGNOSIS — M4316 Spondylolisthesis, lumbar region: Secondary | ICD-10-CM | POA: Diagnosis not present

## 2021-02-06 DIAGNOSIS — G894 Chronic pain syndrome: Secondary | ICD-10-CM | POA: Diagnosis not present

## 2021-02-06 DIAGNOSIS — M47816 Spondylosis without myelopathy or radiculopathy, lumbar region: Secondary | ICD-10-CM | POA: Diagnosis not present

## 2021-02-07 DIAGNOSIS — N760 Acute vaginitis: Secondary | ICD-10-CM | POA: Diagnosis not present

## 2021-03-20 ENCOUNTER — Other Ambulatory Visit: Payer: Self-pay | Admitting: Physician Assistant

## 2021-03-20 DIAGNOSIS — R1319 Other dysphagia: Secondary | ICD-10-CM | POA: Diagnosis not present

## 2021-04-02 ENCOUNTER — Ambulatory Visit
Admission: RE | Admit: 2021-04-02 | Discharge: 2021-04-02 | Disposition: A | Discharge status: Home / Self Care | Payer: Medicare Other | Source: Ambulatory Visit | Attending: Physician Assistant | Admitting: Physician Assistant

## 2021-04-02 DIAGNOSIS — R1319 Other dysphagia: Secondary | ICD-10-CM

## 2021-04-02 DIAGNOSIS — R1310 Dysphagia, unspecified: Secondary | ICD-10-CM | POA: Diagnosis not present

## 2021-05-02 DIAGNOSIS — R300 Dysuria: Secondary | ICD-10-CM | POA: Diagnosis not present

## 2021-05-06 DIAGNOSIS — M47816 Spondylosis without myelopathy or radiculopathy, lumbar region: Secondary | ICD-10-CM | POA: Diagnosis not present

## 2021-05-06 DIAGNOSIS — Z79891 Long term (current) use of opiate analgesic: Secondary | ICD-10-CM | POA: Diagnosis not present

## 2021-05-06 DIAGNOSIS — G894 Chronic pain syndrome: Secondary | ICD-10-CM | POA: Diagnosis not present

## 2021-05-06 DIAGNOSIS — M4316 Spondylolisthesis, lumbar region: Secondary | ICD-10-CM | POA: Diagnosis not present

## 2021-06-04 DIAGNOSIS — K319 Disease of stomach and duodenum, unspecified: Secondary | ICD-10-CM | POA: Diagnosis not present

## 2021-06-04 DIAGNOSIS — K2289 Other specified disease of esophagus: Secondary | ICD-10-CM | POA: Diagnosis not present

## 2021-06-04 DIAGNOSIS — K449 Diaphragmatic hernia without obstruction or gangrene: Secondary | ICD-10-CM | POA: Diagnosis not present

## 2021-06-04 DIAGNOSIS — K3189 Other diseases of stomach and duodenum: Secondary | ICD-10-CM | POA: Diagnosis not present

## 2021-06-04 DIAGNOSIS — R1310 Dysphagia, unspecified: Secondary | ICD-10-CM | POA: Diagnosis not present

## 2021-06-04 DIAGNOSIS — K222 Esophageal obstruction: Secondary | ICD-10-CM | POA: Diagnosis not present

## 2021-06-07 DIAGNOSIS — K319 Disease of stomach and duodenum, unspecified: Secondary | ICD-10-CM | POA: Diagnosis not present

## 2021-06-07 DIAGNOSIS — K2289 Other specified disease of esophagus: Secondary | ICD-10-CM | POA: Diagnosis not present

## 2021-07-24 DIAGNOSIS — F329 Major depressive disorder, single episode, unspecified: Secondary | ICD-10-CM | POA: Diagnosis not present

## 2021-07-24 DIAGNOSIS — E785 Hyperlipidemia, unspecified: Secondary | ICD-10-CM | POA: Diagnosis not present

## 2021-07-24 DIAGNOSIS — I10 Essential (primary) hypertension: Secondary | ICD-10-CM | POA: Diagnosis not present

## 2021-08-01 DIAGNOSIS — H04123 Dry eye syndrome of bilateral lacrimal glands: Secondary | ICD-10-CM | POA: Diagnosis not present

## 2021-08-01 DIAGNOSIS — H25013 Cortical age-related cataract, bilateral: Secondary | ICD-10-CM | POA: Diagnosis not present

## 2021-08-01 DIAGNOSIS — H5203 Hypermetropia, bilateral: Secondary | ICD-10-CM | POA: Diagnosis not present

## 2021-08-01 DIAGNOSIS — H524 Presbyopia: Secondary | ICD-10-CM | POA: Diagnosis not present

## 2021-08-06 DIAGNOSIS — Z79891 Long term (current) use of opiate analgesic: Secondary | ICD-10-CM | POA: Diagnosis not present

## 2021-08-06 DIAGNOSIS — M4316 Spondylolisthesis, lumbar region: Secondary | ICD-10-CM | POA: Diagnosis not present

## 2021-08-06 DIAGNOSIS — M47816 Spondylosis without myelopathy or radiculopathy, lumbar region: Secondary | ICD-10-CM | POA: Diagnosis not present

## 2021-08-06 DIAGNOSIS — G894 Chronic pain syndrome: Secondary | ICD-10-CM | POA: Diagnosis not present

## 2021-10-30 DIAGNOSIS — G894 Chronic pain syndrome: Secondary | ICD-10-CM | POA: Diagnosis not present

## 2021-10-30 DIAGNOSIS — M4316 Spondylolisthesis, lumbar region: Secondary | ICD-10-CM | POA: Diagnosis not present

## 2021-10-30 DIAGNOSIS — M47816 Spondylosis without myelopathy or radiculopathy, lumbar region: Secondary | ICD-10-CM | POA: Diagnosis not present

## 2021-10-30 DIAGNOSIS — Z79891 Long term (current) use of opiate analgesic: Secondary | ICD-10-CM | POA: Diagnosis not present

## 2021-11-28 DIAGNOSIS — Z01419 Encounter for gynecological examination (general) (routine) without abnormal findings: Secondary | ICD-10-CM | POA: Diagnosis not present

## 2021-11-28 DIAGNOSIS — Z1231 Encounter for screening mammogram for malignant neoplasm of breast: Secondary | ICD-10-CM | POA: Diagnosis not present

## 2021-11-28 DIAGNOSIS — Z6831 Body mass index (BMI) 31.0-31.9, adult: Secondary | ICD-10-CM | POA: Diagnosis not present

## 2021-12-17 DIAGNOSIS — Z20822 Contact with and (suspected) exposure to covid-19: Secondary | ICD-10-CM | POA: Diagnosis not present

## 2022-01-29 DIAGNOSIS — G894 Chronic pain syndrome: Secondary | ICD-10-CM | POA: Diagnosis not present

## 2022-01-29 DIAGNOSIS — M4316 Spondylolisthesis, lumbar region: Secondary | ICD-10-CM | POA: Diagnosis not present

## 2022-01-29 DIAGNOSIS — M47816 Spondylosis without myelopathy or radiculopathy, lumbar region: Secondary | ICD-10-CM | POA: Diagnosis not present

## 2022-01-29 DIAGNOSIS — Z79891 Long term (current) use of opiate analgesic: Secondary | ICD-10-CM | POA: Diagnosis not present

## 2022-02-07 DIAGNOSIS — H04123 Dry eye syndrome of bilateral lacrimal glands: Secondary | ICD-10-CM | POA: Diagnosis not present

## 2022-02-07 DIAGNOSIS — H25013 Cortical age-related cataract, bilateral: Secondary | ICD-10-CM | POA: Diagnosis not present

## 2022-02-24 DIAGNOSIS — E785 Hyperlipidemia, unspecified: Secondary | ICD-10-CM | POA: Diagnosis not present

## 2022-02-24 DIAGNOSIS — I10 Essential (primary) hypertension: Secondary | ICD-10-CM | POA: Diagnosis not present

## 2022-02-24 DIAGNOSIS — M8580 Other specified disorders of bone density and structure, unspecified site: Secondary | ICD-10-CM | POA: Diagnosis not present

## 2022-03-03 ENCOUNTER — Other Ambulatory Visit: Payer: Self-pay | Admitting: Internal Medicine

## 2022-03-03 DIAGNOSIS — I700 Atherosclerosis of aorta: Secondary | ICD-10-CM | POA: Diagnosis not present

## 2022-03-03 DIAGNOSIS — R7301 Impaired fasting glucose: Secondary | ICD-10-CM | POA: Diagnosis not present

## 2022-03-03 DIAGNOSIS — E785 Hyperlipidemia, unspecified: Secondary | ICD-10-CM | POA: Diagnosis not present

## 2022-03-03 DIAGNOSIS — M1990 Unspecified osteoarthritis, unspecified site: Secondary | ICD-10-CM | POA: Diagnosis not present

## 2022-03-03 DIAGNOSIS — I2510 Atherosclerotic heart disease of native coronary artery without angina pectoris: Secondary | ICD-10-CM | POA: Diagnosis not present

## 2022-03-03 DIAGNOSIS — I10 Essential (primary) hypertension: Secondary | ICD-10-CM | POA: Diagnosis not present

## 2022-03-03 DIAGNOSIS — R82998 Other abnormal findings in urine: Secondary | ICD-10-CM | POA: Diagnosis not present

## 2022-03-03 DIAGNOSIS — F329 Major depressive disorder, single episode, unspecified: Secondary | ICD-10-CM | POA: Diagnosis not present

## 2022-03-03 DIAGNOSIS — G510 Bell's palsy: Secondary | ICD-10-CM | POA: Diagnosis not present

## 2022-03-03 DIAGNOSIS — Z0000 Encounter for general adult medical examination without abnormal findings: Secondary | ICD-10-CM | POA: Diagnosis not present

## 2022-03-03 DIAGNOSIS — Z1212 Encounter for screening for malignant neoplasm of rectum: Secondary | ICD-10-CM | POA: Diagnosis not present

## 2022-03-03 DIAGNOSIS — R1031 Right lower quadrant pain: Secondary | ICD-10-CM

## 2022-03-03 DIAGNOSIS — M8580 Other specified disorders of bone density and structure, unspecified site: Secondary | ICD-10-CM | POA: Diagnosis not present

## 2022-03-14 DIAGNOSIS — Z20822 Contact with and (suspected) exposure to covid-19: Secondary | ICD-10-CM | POA: Diagnosis not present

## 2022-03-26 DIAGNOSIS — K4090 Unilateral inguinal hernia, without obstruction or gangrene, not specified as recurrent: Secondary | ICD-10-CM | POA: Diagnosis not present

## 2022-03-28 ENCOUNTER — Ambulatory Visit
Admission: RE | Admit: 2022-03-28 | Discharge: 2022-03-28 | Disposition: A | Discharge status: Home / Self Care | Payer: Medicare Other | Source: Ambulatory Visit | Attending: Internal Medicine | Admitting: Internal Medicine

## 2022-03-28 DIAGNOSIS — Z87448 Personal history of other diseases of urinary system: Secondary | ICD-10-CM | POA: Diagnosis not present

## 2022-03-28 DIAGNOSIS — K5730 Diverticulosis of large intestine without perforation or abscess without bleeding: Secondary | ICD-10-CM | POA: Diagnosis not present

## 2022-03-28 DIAGNOSIS — K4090 Unilateral inguinal hernia, without obstruction or gangrene, not specified as recurrent: Secondary | ICD-10-CM | POA: Diagnosis not present

## 2022-03-28 DIAGNOSIS — R1031 Right lower quadrant pain: Secondary | ICD-10-CM

## 2022-03-28 DIAGNOSIS — Z8742 Personal history of other diseases of the female genital tract: Secondary | ICD-10-CM | POA: Diagnosis not present

## 2022-03-28 MED ORDER — IOPAMIDOL (ISOVUE-300) INJECTION 61%
100.0000 mL | Freq: Once | INTRAVENOUS | Status: AC | PRN
  Administered 2022-03-28: 100 mL via INTRAVENOUS

## 2022-04-23 DIAGNOSIS — Z79891 Long term (current) use of opiate analgesic: Secondary | ICD-10-CM | POA: Diagnosis not present

## 2022-04-23 DIAGNOSIS — G894 Chronic pain syndrome: Secondary | ICD-10-CM | POA: Diagnosis not present

## 2022-04-23 DIAGNOSIS — M47816 Spondylosis without myelopathy or radiculopathy, lumbar region: Secondary | ICD-10-CM | POA: Diagnosis not present

## 2022-04-23 DIAGNOSIS — M4316 Spondylolisthesis, lumbar region: Secondary | ICD-10-CM | POA: Diagnosis not present

## 2022-07-22 DIAGNOSIS — G894 Chronic pain syndrome: Secondary | ICD-10-CM | POA: Diagnosis not present

## 2022-07-22 DIAGNOSIS — M4316 Spondylolisthesis, lumbar region: Secondary | ICD-10-CM | POA: Diagnosis not present

## 2022-07-22 DIAGNOSIS — Z79891 Long term (current) use of opiate analgesic: Secondary | ICD-10-CM | POA: Diagnosis not present

## 2022-07-22 DIAGNOSIS — M47816 Spondylosis without myelopathy or radiculopathy, lumbar region: Secondary | ICD-10-CM | POA: Diagnosis not present

## 2022-08-05 DIAGNOSIS — H5310 Unspecified subjective visual disturbances: Secondary | ICD-10-CM | POA: Diagnosis not present

## 2022-08-05 DIAGNOSIS — H25013 Cortical age-related cataract, bilateral: Secondary | ICD-10-CM | POA: Diagnosis not present

## 2022-08-15 DIAGNOSIS — H5310 Unspecified subjective visual disturbances: Secondary | ICD-10-CM | POA: Diagnosis not present

## 2022-09-02 DIAGNOSIS — G8929 Other chronic pain: Secondary | ICD-10-CM | POA: Diagnosis not present

## 2022-09-02 DIAGNOSIS — M4316 Spondylolisthesis, lumbar region: Secondary | ICD-10-CM | POA: Diagnosis not present

## 2022-09-02 DIAGNOSIS — M5137 Other intervertebral disc degeneration, lumbosacral region: Secondary | ICD-10-CM | POA: Diagnosis not present

## 2022-09-02 DIAGNOSIS — M5459 Other low back pain: Secondary | ICD-10-CM | POA: Diagnosis not present

## 2022-09-02 DIAGNOSIS — M545 Low back pain, unspecified: Secondary | ICD-10-CM | POA: Diagnosis not present

## 2022-09-02 DIAGNOSIS — M25552 Pain in left hip: Secondary | ICD-10-CM | POA: Diagnosis not present

## 2022-09-02 DIAGNOSIS — M5136 Other intervertebral disc degeneration, lumbar region: Secondary | ICD-10-CM | POA: Diagnosis not present

## 2022-09-02 DIAGNOSIS — M47816 Spondylosis without myelopathy or radiculopathy, lumbar region: Secondary | ICD-10-CM | POA: Diagnosis not present

## 2022-09-02 DIAGNOSIS — M160 Bilateral primary osteoarthritis of hip: Secondary | ICD-10-CM | POA: Diagnosis not present

## 2022-09-16 DIAGNOSIS — E785 Hyperlipidemia, unspecified: Secondary | ICD-10-CM | POA: Diagnosis not present

## 2022-09-16 DIAGNOSIS — R7989 Other specified abnormal findings of blood chemistry: Secondary | ICD-10-CM | POA: Diagnosis not present

## 2022-10-21 DIAGNOSIS — G894 Chronic pain syndrome: Secondary | ICD-10-CM | POA: Diagnosis not present

## 2022-10-21 DIAGNOSIS — M47816 Spondylosis without myelopathy or radiculopathy, lumbar region: Secondary | ICD-10-CM | POA: Diagnosis not present

## 2022-10-21 DIAGNOSIS — M4316 Spondylolisthesis, lumbar region: Secondary | ICD-10-CM | POA: Diagnosis not present

## 2022-10-21 DIAGNOSIS — Z79891 Long term (current) use of opiate analgesic: Secondary | ICD-10-CM | POA: Diagnosis not present

## 2022-12-23 DIAGNOSIS — M25561 Pain in right knee: Secondary | ICD-10-CM | POA: Diagnosis not present

## 2023-01-15 DIAGNOSIS — M4316 Spondylolisthesis, lumbar region: Secondary | ICD-10-CM | POA: Diagnosis not present

## 2023-01-15 DIAGNOSIS — M47816 Spondylosis without myelopathy or radiculopathy, lumbar region: Secondary | ICD-10-CM | POA: Diagnosis not present

## 2023-01-15 DIAGNOSIS — G894 Chronic pain syndrome: Secondary | ICD-10-CM | POA: Diagnosis not present

## 2023-01-15 DIAGNOSIS — Z79891 Long term (current) use of opiate analgesic: Secondary | ICD-10-CM | POA: Diagnosis not present

## 2023-01-22 DIAGNOSIS — Z1231 Encounter for screening mammogram for malignant neoplasm of breast: Secondary | ICD-10-CM | POA: Diagnosis not present

## 2023-01-22 DIAGNOSIS — Z6830 Body mass index (BMI) 30.0-30.9, adult: Secondary | ICD-10-CM | POA: Diagnosis not present

## 2023-01-22 DIAGNOSIS — Z1151 Encounter for screening for human papillomavirus (HPV): Secondary | ICD-10-CM | POA: Diagnosis not present

## 2023-01-22 DIAGNOSIS — Z1272 Encounter for screening for malignant neoplasm of vagina: Secondary | ICD-10-CM | POA: Diagnosis not present

## 2023-01-22 DIAGNOSIS — Z01419 Encounter for gynecological examination (general) (routine) without abnormal findings: Secondary | ICD-10-CM | POA: Diagnosis not present

## 2023-02-10 DIAGNOSIS — H5310 Unspecified subjective visual disturbances: Secondary | ICD-10-CM | POA: Diagnosis not present

## 2023-02-10 DIAGNOSIS — H2513 Age-related nuclear cataract, bilateral: Secondary | ICD-10-CM | POA: Diagnosis not present

## 2023-03-17 DIAGNOSIS — M25561 Pain in right knee: Secondary | ICD-10-CM | POA: Diagnosis not present

## 2023-04-02 DIAGNOSIS — E785 Hyperlipidemia, unspecified: Secondary | ICD-10-CM | POA: Diagnosis not present

## 2023-04-02 DIAGNOSIS — F419 Anxiety disorder, unspecified: Secondary | ICD-10-CM | POA: Diagnosis not present

## 2023-04-02 DIAGNOSIS — R7989 Other specified abnormal findings of blood chemistry: Secondary | ICD-10-CM | POA: Diagnosis not present

## 2023-04-02 DIAGNOSIS — M8589 Other specified disorders of bone density and structure, multiple sites: Secondary | ICD-10-CM | POA: Diagnosis not present

## 2023-04-02 DIAGNOSIS — I10 Essential (primary) hypertension: Secondary | ICD-10-CM | POA: Diagnosis not present

## 2023-04-02 DIAGNOSIS — R002 Palpitations: Secondary | ICD-10-CM | POA: Diagnosis not present

## 2023-04-09 DIAGNOSIS — M47816 Spondylosis without myelopathy or radiculopathy, lumbar region: Secondary | ICD-10-CM | POA: Diagnosis not present

## 2023-04-09 DIAGNOSIS — I700 Atherosclerosis of aorta: Secondary | ICD-10-CM | POA: Diagnosis not present

## 2023-04-09 DIAGNOSIS — I10 Essential (primary) hypertension: Secondary | ICD-10-CM | POA: Diagnosis not present

## 2023-04-09 DIAGNOSIS — F329 Major depressive disorder, single episode, unspecified: Secondary | ICD-10-CM | POA: Diagnosis not present

## 2023-04-09 DIAGNOSIS — R82998 Other abnormal findings in urine: Secondary | ICD-10-CM | POA: Diagnosis not present

## 2023-04-09 DIAGNOSIS — Z1339 Encounter for screening examination for other mental health and behavioral disorders: Secondary | ICD-10-CM | POA: Diagnosis not present

## 2023-04-09 DIAGNOSIS — Z0000 Encounter for general adult medical examination without abnormal findings: Secondary | ICD-10-CM | POA: Diagnosis not present

## 2023-04-09 DIAGNOSIS — Z1331 Encounter for screening for depression: Secondary | ICD-10-CM | POA: Diagnosis not present

## 2023-04-09 DIAGNOSIS — E785 Hyperlipidemia, unspecified: Secondary | ICD-10-CM | POA: Diagnosis not present

## 2023-04-09 DIAGNOSIS — F419 Anxiety disorder, unspecified: Secondary | ICD-10-CM | POA: Diagnosis not present

## 2023-04-09 DIAGNOSIS — K4090 Unilateral inguinal hernia, without obstruction or gangrene, not specified as recurrent: Secondary | ICD-10-CM | POA: Diagnosis not present

## 2023-04-09 DIAGNOSIS — G510 Bell's palsy: Secondary | ICD-10-CM | POA: Diagnosis not present

## 2023-04-09 DIAGNOSIS — N951 Menopausal and female climacteric states: Secondary | ICD-10-CM | POA: Diagnosis not present

## 2023-04-16 DIAGNOSIS — Z79891 Long term (current) use of opiate analgesic: Secondary | ICD-10-CM | POA: Diagnosis not present

## 2023-04-16 DIAGNOSIS — M47816 Spondylosis without myelopathy or radiculopathy, lumbar region: Secondary | ICD-10-CM | POA: Diagnosis not present

## 2023-04-16 DIAGNOSIS — G894 Chronic pain syndrome: Secondary | ICD-10-CM | POA: Diagnosis not present

## 2023-04-16 DIAGNOSIS — M4316 Spondylolisthesis, lumbar region: Secondary | ICD-10-CM | POA: Diagnosis not present

## 2023-04-20 DIAGNOSIS — M7121 Synovial cyst of popliteal space [Baker], right knee: Secondary | ICD-10-CM | POA: Diagnosis not present

## 2023-05-18 DIAGNOSIS — Z79891 Long term (current) use of opiate analgesic: Secondary | ICD-10-CM | POA: Diagnosis not present

## 2023-05-18 DIAGNOSIS — G894 Chronic pain syndrome: Secondary | ICD-10-CM | POA: Diagnosis not present

## 2023-05-18 DIAGNOSIS — M4316 Spondylolisthesis, lumbar region: Secondary | ICD-10-CM | POA: Diagnosis not present

## 2023-05-18 DIAGNOSIS — M47816 Spondylosis without myelopathy or radiculopathy, lumbar region: Secondary | ICD-10-CM | POA: Diagnosis not present

## 2023-05-27 DIAGNOSIS — R110 Nausea: Secondary | ICD-10-CM | POA: Diagnosis not present

## 2023-05-27 DIAGNOSIS — R197 Diarrhea, unspecified: Secondary | ICD-10-CM | POA: Diagnosis not present

## 2023-05-27 DIAGNOSIS — I10 Essential (primary) hypertension: Secondary | ICD-10-CM | POA: Diagnosis not present

## 2023-05-27 DIAGNOSIS — R748 Abnormal levels of other serum enzymes: Secondary | ICD-10-CM | POA: Diagnosis not present

## 2023-06-08 DIAGNOSIS — K4090 Unilateral inguinal hernia, without obstruction or gangrene, not specified as recurrent: Secondary | ICD-10-CM | POA: Diagnosis not present

## 2023-07-13 DIAGNOSIS — G894 Chronic pain syndrome: Secondary | ICD-10-CM | POA: Diagnosis not present

## 2023-07-13 DIAGNOSIS — M4316 Spondylolisthesis, lumbar region: Secondary | ICD-10-CM | POA: Diagnosis not present

## 2023-07-13 DIAGNOSIS — M47816 Spondylosis without myelopathy or radiculopathy, lumbar region: Secondary | ICD-10-CM | POA: Diagnosis not present

## 2023-07-13 DIAGNOSIS — Z79891 Long term (current) use of opiate analgesic: Secondary | ICD-10-CM | POA: Diagnosis not present

## 2023-07-13 NOTE — Progress Notes (Signed)
Sent message, via epic in basket, requesting orders in epic from surgeon.  

## 2023-07-14 ENCOUNTER — Ambulatory Visit: Payer: Self-pay | Admitting: Surgery

## 2023-07-16 NOTE — Patient Instructions (Signed)
DUE TO COVID-19 ONLY TWO VISITORS  (aged 71 and older)  ARE ALLOWED TO COME WITH YOU AND STAY IN THE WAITING ROOM ONLY DURING PRE OP AND PROCEDURE.   **NO VISITORS ARE ALLOWED IN THE SHORT STAY AREA OR RECOVERY ROOM!!**  IF YOU WILL BE ADMITTED INTO THE HOSPITAL YOU ARE ALLOWED ONLY FOUR SUPPORT PEOPLE DURING VISITATION HOURS ONLY (7 AM -8PM)   The support person(s) must pass our screening, gel in and out, and wear a mask at all times, including in the patient's room. Patients must also wear a mask when staff or their support person are in the room. Visitors GUEST BADGE MUST BE WORN VISIBLY  One adult visitor may remain with you overnight and MUST be in the room by 8 P.M.     Your procedure is scheduled on: 08/07/23   Report to [facility] Main Entrance    Report to admitting at : 7:15 AM   Call this number if you have problems the morning of surgery [phone]   Eat a light diet the day before surgery.  Examples including soups, broths, toast, yogurt, mashed potatoes.  Things to avoid include carbonated beverages (fizzy beverages), raw fruits and raw vegetables, or beans.   If your bowels are filled with gas, your surgeon will have difficulty visualizing your pelvic organs which increases your surgical risks. Do not eat food :After Midnight.   After Midnight you may have the following liquids until : 6:30 AM DAY OF SURGERY  Water Black Coffee (sugar ok, NO MILK/CREAM OR CREAMERS)  Tea (sugar ok, NO MILK/CREAM OR CREAMERS) regular and decaf                             Plain Jell-O (NO RED)                                           Fruit ices (not with fruit pulp, NO RED)                                     Popsicles (NO RED)                                                                  Juice: apple, WHITE grape, WHITE cranberry Sports drinks like Gatorade (NO RED)              FOLLOW ANY ADDITIONAL PRE OP INSTRUCTIONS YOU RECEIVED FROM YOUR SURGEON'S OFFICE!!!      Oral Hygiene is also important to reduce your risk of infection.                                    Remember - BRUSH YOUR TEETH THE MORNING OF SURGERY WITH YOUR REGULAR TOOTHPASTE  DENTURES WILL BE REMOVED PRIOR TO SURGERY PLEASE DO NOT APPLY "Poly grip" OR ADHESIVES!!!   Do NOT smoke after Midnight   Take these medicines the morning of surgery with A SIP  OF WATER: escitalopram,propranolol,misoprostol.                              You may not have any metal on your body including hair pins, jewelry, and body piercing             Do not wear make-up, lotions, powders, perfumes/cologne, or deodorant  Do not wear nail polish including gel and S&S, artificial/acrylic nails, or any other type of covering on natural nails including finger and toenails. If you have artificial nails, gel coating, etc. that needs to be removed by a nail salon please have this removed prior to surgery or surgery may need to be canceled/ delayed if the surgeon/ anesthesia feels like they are unable to be safely monitored.   Do not shave  48 hours prior to surgery.    Do not bring valuables to the hospital. [facility] IS NOT             RESPONSIBLE   FOR VALUABLES.   Contacts, glasses, or bridgework may not be worn into surgery.   Bring small overnight bag day of surgery.   DO NOT BRING YOUR HOME MEDICATIONS TO THE HOSPITAL. PHARMACY WILL DISPENSE MEDICATIONS LISTED ON YOUR MEDICATION LIST TO YOU DURING YOUR ADMISSION IN THE HOSPITAL!    Patients discharged on the day of surgery will not be allowed to drive home.  Someone NEEDS to stay with you for the first 24 hours after anesthesia.   Special Instructions: Bring a copy of your healthcare power of attorney and living will documents         the day of surgery if you haven't scanned them before.              Please read over the following fact sheets you were given: IF YOU HAVE QUESTIONS ABOUT YOUR PRE-OP INSTRUCTIONS PLEASE CALL [phone]    [facility] -  Preparing for Surgery Before surgery, you can play an important role.  Because skin is not sterile, your skin needs to be as free of germs as possible.  You can reduce the number of germs on your skin by washing with CHG (chlorahexidine gluconate) soap before surgery.  CHG is an antiseptic cleaner which kills germs and bonds with the skin to continue killing germs even after washing. Please DO NOT use if you have an allergy to CHG or antibacterial soaps.  If your skin becomes reddened/irritated stop using the CHG and inform your nurse when you arrive at Short Stay. Do not shave (including legs and underarms) for at least 48 hours prior to the first CHG shower.  You may shave your face/neck. Please follow these instructions carefully:  1.  Shower with CHG Soap the night before surgery and the  morning of Surgery.  2.  If you choose to wash your hair, wash your hair first as usual with your  normal  shampoo.  3.  After you shampoo, rinse your hair and body thoroughly to remove the  shampoo.                           4.  Use CHG as you would any other liquid soap.  You can apply chg directly  to the skin and wash                       Gently with a scrungie  or clean washcloth.  5.  Apply the CHG Soap to your body ONLY FROM THE NECK DOWN.   Do not use on face/ open                           Wound or open sores. Avoid contact with eyes, ears mouth and genitals (private parts).                       Wash face,  Genitals (private parts) with your normal soap.             6.  Wash thoroughly, paying special attention to the area where your surgery  will be performed.  7.  Thoroughly rinse your body with warm water from the neck down.  8.  DO NOT shower/wash with your normal soap after using and rinsing off  the CHG Soap.                9.  Pat yourself dry with a clean towel.            10.  Wear clean pajamas.            11.  Place clean sheets on your bed the night of your first shower and do not  sleep  with pets. Day of Surgery : Do not apply any lotions/deodorants the morning of surgery.  Please wear clean clothes to the hospital/surgery center.  FAILURE TO FOLLOW THESE INSTRUCTIONS MAY RESULT IN THE CANCELLATION OF YOUR SURGERY PATIENT SIGNATURE_________________________________  NURSE SIGNATURE__________________________________  ________________________________________________________________________

## 2023-07-20 ENCOUNTER — Encounter (HOSPITAL_COMMUNITY)
Admission: RE | Admit: 2023-07-20 | Discharge: 2023-07-20 | Disposition: A | Discharge status: Home / Self Care | Payer: Medicare Other | Source: Ambulatory Visit | Attending: Surgery | Admitting: Surgery

## 2023-07-20 ENCOUNTER — Other Ambulatory Visit: Payer: Self-pay

## 2023-07-20 ENCOUNTER — Encounter (HOSPITAL_COMMUNITY): Payer: Self-pay

## 2023-07-20 VITALS — BP 139/84 | HR 58 | Temp 97.7°F | Ht 62.0 in | Wt 159.0 lb

## 2023-07-20 DIAGNOSIS — Z01812 Encounter for preprocedural laboratory examination: Secondary | ICD-10-CM | POA: Diagnosis not present

## 2023-07-20 DIAGNOSIS — Z01818 Encounter for other preprocedural examination: Secondary | ICD-10-CM | POA: Diagnosis present

## 2023-07-20 DIAGNOSIS — R001 Bradycardia, unspecified: Secondary | ICD-10-CM | POA: Diagnosis not present

## 2023-07-20 DIAGNOSIS — I4891 Unspecified atrial fibrillation: Secondary | ICD-10-CM

## 2023-07-20 DIAGNOSIS — Z01810 Encounter for preprocedural cardiovascular examination: Secondary | ICD-10-CM | POA: Insufficient documentation

## 2023-07-20 HISTORY — DX: Essential (primary) hypertension: I10

## 2023-07-20 LAB — BASIC METABOLIC PANEL
Anion gap: 8 (ref 5–15)
BUN: 21 mg/dL (ref 8–23)
CO2: 26 mmol/L (ref 22–32)
Calcium: 9 mg/dL (ref 8.9–10.3)
Chloride: 103 mmol/L (ref 98–111)
Creatinine, Ser: 0.9 mg/dL (ref 0.44–1.00)
GFR, Estimated: >60 mL/min (ref >60)
Glucose, Bld: 112 mg/dL — ABNORMAL HIGH (ref 70–99)
Potassium: 3.9 mmol/L (ref 3.5–5.1)
Sodium: 137 mmol/L (ref 135–145)

## 2023-07-20 LAB — CBC
HCT: 42.5 % (ref 36.0–46.0)
Hemoglobin: 13.2 g/dL (ref 12.0–15.0)
MCH: 26.2 pg (ref 26.0–34.0)
MCHC: 31.1 g/dL (ref 30.0–36.0)
MCV: 84.3 fL (ref 80.0–100.0)
Platelets: 252 10*3/uL (ref 150–400)
RBC: 5.04 MIL/uL (ref 3.87–5.11)
RDW: 14.1 % (ref 11.5–15.5)
WBC: 6.6 10*3/uL (ref 4.0–10.5)
nRBC: 0 % (ref 0.0–0.2)

## 2023-07-20 NOTE — Progress Notes (Signed)
For Short Stay: COVID SWAB appointment date:  Bowel Prep reminder:   For Anesthesia: PCP - Dr. Rodrigo Ran. LOV: 04/02/23 Cardiologist - [name]  Chest x-ray -  EKG - 07/20/23 Stress Test -  ECHO - 01/23/17 Cardiac Cath -  Pacemaker/ICD device last checked: Pacemaker orders received: Device Rep notified:  Spinal Cord Stimulator: [name]  Sleep Study - [name] CPAP -   Fasting Blood Sugar - [name] Checks Blood Sugar _____ times a day Date and result of last Hgb A1c-  Last dose of GLP1 agonist- [name] GLP1 instructions:   Last dose of SGLT-2 inhibitors- [name] SGLT-2 instructions:   Blood Thinner Instructions:[name] Aspirin Instructions: Last Dose:  Activity level: Can go up a flight of stairs and activities of daily living without stopping and without chest pain and/or shortness of breath   Able to exercise without chest pain and/or shortness of breath  Anesthesia review: Hx: Mitral valve prolapse,HTN,Afib (as per Echo note).  Patient denies shortness of breath, fever, cough and chest pain at PAT appointment   Patient verbalized understanding of instructions that were given to them at the PAT appointment. Patient was also instructed that they will need to review over the PAT instructions again at home before surgery.

## 2023-08-07 ENCOUNTER — Ambulatory Visit (HOSPITAL_BASED_OUTPATIENT_CLINIC_OR_DEPARTMENT_OTHER): Payer: Medicare Other | Admitting: Physician Assistant

## 2023-08-07 ENCOUNTER — Other Ambulatory Visit: Payer: Self-pay

## 2023-08-07 ENCOUNTER — Ambulatory Visit (HOSPITAL_COMMUNITY)
Admission: RE | Admit: 2023-08-07 | Discharge: 2023-08-07 | Disposition: A | Discharge status: Home / Self Care | Payer: Medicare Other | Source: Ambulatory Visit | Attending: Surgery | Admitting: Surgery

## 2023-08-07 ENCOUNTER — Encounter (HOSPITAL_COMMUNITY): Payer: Self-pay | Admitting: Surgery

## 2023-08-07 ENCOUNTER — Ambulatory Visit (HOSPITAL_COMMUNITY): Payer: Medicare Other | Admitting: Physician Assistant

## 2023-08-07 ENCOUNTER — Encounter (HOSPITAL_COMMUNITY)
Admission: RE | Disposition: A | Discharge status: Home / Self Care | Payer: Self-pay | Source: Ambulatory Visit | Attending: Surgery

## 2023-08-07 DIAGNOSIS — F32A Depression, unspecified: Secondary | ICD-10-CM | POA: Insufficient documentation

## 2023-08-07 DIAGNOSIS — K4090 Unilateral inguinal hernia, without obstruction or gangrene, not specified as recurrent: Secondary | ICD-10-CM

## 2023-08-07 DIAGNOSIS — I10 Essential (primary) hypertension: Secondary | ICD-10-CM | POA: Diagnosis not present

## 2023-08-07 DIAGNOSIS — F419 Anxiety disorder, unspecified: Secondary | ICD-10-CM | POA: Diagnosis not present

## 2023-08-07 DIAGNOSIS — D62 Acute posthemorrhagic anemia: Secondary | ICD-10-CM | POA: Diagnosis not present

## 2023-08-07 DIAGNOSIS — J9600 Acute respiratory failure, unspecified whether with hypoxia or hypercapnia: Secondary | ICD-10-CM | POA: Diagnosis not present

## 2023-08-07 HISTORY — PX: INGUINAL HERNIA REPAIR: SHX194

## 2023-08-07 SURGERY — REPAIR, HERNIA, INGUINAL, LAPAROSCOPIC
Anesthesia: General | Laterality: Right

## 2023-08-07 MED ORDER — CHLORHEXIDINE GLUCONATE CLOTH 2 % EX PADS: 6.0000 | MEDICATED_PAD | Freq: Once | CUTANEOUS | Status: DC

## 2023-08-07 MED ORDER — BUPIVACAINE-EPINEPHRINE 0.25% -1:200000 IJ SOLN
INTRAMUSCULAR | Status: AC
  Filled 2023-08-07: qty 1

## 2023-08-07 MED ORDER — CEFAZOLIN SODIUM-DEXTROSE 2-4 GM/100ML-% IV SOLN
2.0000 g | INTRAVENOUS | Status: AC
  Administered 2023-08-07: 2 g via INTRAVENOUS
  Filled 2023-08-07: qty 100

## 2023-08-07 MED ORDER — BUPIVACAINE LIPOSOME 1.3 % IJ SUSP
INTRAMUSCULAR | Status: DC | PRN
  Administered 2023-08-07: 20 mL

## 2023-08-07 MED ORDER — 0.9 % SODIUM CHLORIDE (POUR BTL) OPTIME
TOPICAL | Status: DC | PRN
  Administered 2023-08-07: 1000 mL

## 2023-08-07 MED ORDER — OXYCODONE HCL 5 MG PO TABS
5.0000 mg | ORAL_TABLET | Freq: Once | ORAL | Status: AC | PRN
  Administered 2023-08-07: 5 mg via ORAL

## 2023-08-07 MED ORDER — ONDANSETRON HCL 4 MG/2ML IJ SOLN
INTRAMUSCULAR | Status: AC
  Filled 2023-08-07: qty 2

## 2023-08-07 MED ORDER — SUGAMMADEX SODIUM 200 MG/2ML IV SOLN
INTRAVENOUS | Status: DC | PRN
Start: 2023-08-07 — End: 2023-08-07
  Administered 2023-08-07: 200 mg via INTRAVENOUS

## 2023-08-07 MED ORDER — ONDANSETRON HCL 4 MG/2ML IJ SOLN: 4.0000 mg | Freq: Four times a day (QID) | INTRAMUSCULAR | Status: DC | PRN

## 2023-08-07 MED ORDER — CHLORHEXIDINE GLUCONATE 0.12 % MT SOLN
15.0000 mL | Freq: Once | OROMUCOSAL | Status: AC
  Administered 2023-08-07: 15 mL via OROMUCOSAL

## 2023-08-07 MED ORDER — ORAL CARE MOUTH RINSE: 15.0000 mL | Freq: Once | OROMUCOSAL | Status: AC

## 2023-08-07 MED ORDER — DEXAMETHASONE SODIUM PHOSPHATE 10 MG/ML IJ SOLN
INTRAMUSCULAR | Status: AC
  Filled 2023-08-07: qty 1

## 2023-08-07 MED ORDER — ACETAMINOPHEN 500 MG PO TABS
1000.0000 mg | ORAL_TABLET | ORAL | Status: AC
  Administered 2023-08-07: 1000 mg via ORAL
  Filled 2023-08-07: qty 2

## 2023-08-07 MED ORDER — ONDANSETRON HCL 4 MG/2ML IJ SOLN
INTRAMUSCULAR | Status: DC | PRN
  Administered 2023-08-07: 4 mg via INTRAVENOUS

## 2023-08-07 MED ORDER — PROPOFOL 10 MG/ML IV BOLUS
INTRAVENOUS | Status: DC | PRN
  Administered 2023-08-07: 100 mg via INTRAVENOUS

## 2023-08-07 MED ORDER — ATROPINE SULFATE 1 MG/ML IV SOLN
INTRAVENOUS | Status: AC
  Administered 2023-08-07: 0.25 mg
  Filled 2023-08-07: qty 1

## 2023-08-07 MED ORDER — BUPIVACAINE LIPOSOME 1.3 % IJ SUSP: 20.0000 mL | Freq: Once | INTRAMUSCULAR | Status: DC

## 2023-08-07 MED ORDER — OXYCODONE HCL 5 MG/5ML PO SOLN: 5.0000 mg | Freq: Once | ORAL | Status: AC | PRN

## 2023-08-07 MED ORDER — FENTANYL CITRATE (PF) 250 MCG/5ML IJ SOLN
INTRAMUSCULAR | Status: AC
  Filled 2023-08-07: qty 5

## 2023-08-07 MED ORDER — FENTANYL CITRATE (PF) 100 MCG/2ML IJ SOLN
INTRAMUSCULAR | Status: DC | PRN
  Administered 2023-08-07: 50 ug via INTRAVENOUS
  Administered 2023-08-07: 75 ug via INTRAVENOUS

## 2023-08-07 MED ORDER — LACTATED RINGERS IV SOLN: INTRAVENOUS | Status: DC

## 2023-08-07 MED ORDER — PROPOFOL 10 MG/ML IV BOLUS
INTRAVENOUS | Status: AC
  Filled 2023-08-07: qty 20

## 2023-08-07 MED ORDER — OXYCODONE HCL 5 MG PO TABS
ORAL_TABLET | ORAL | Status: AC
  Filled 2023-08-07: qty 1

## 2023-08-07 MED ORDER — FENTANYL CITRATE PF 50 MCG/ML IJ SOSY
PREFILLED_SYRINGE | INTRAMUSCULAR | Status: AC
  Filled 2023-08-07: qty 1

## 2023-08-07 MED ORDER — ROCURONIUM BROMIDE 10 MG/ML (PF) SYRINGE
PREFILLED_SYRINGE | INTRAVENOUS | Status: AC
  Filled 2023-08-07: qty 10

## 2023-08-07 MED ORDER — HEPARIN SODIUM (PORCINE) 5000 UNIT/ML IJ SOLN
5000.0000 [IU] | Freq: Once | INTRAMUSCULAR | Status: AC
  Administered 2023-08-07: 5000 [IU] via SUBCUTANEOUS
  Filled 2023-08-07: qty 1

## 2023-08-07 MED ORDER — BUPIVACAINE-EPINEPHRINE (PF) 0.25% -1:200000 IJ SOLN
INTRAMUSCULAR | Status: DC | PRN
  Administered 2023-08-07: 30 mL

## 2023-08-07 MED ORDER — DEXAMETHASONE SODIUM PHOSPHATE 4 MG/ML IJ SOLN
INTRAMUSCULAR | Status: DC | PRN
  Administered 2023-08-07: 4 mg via INTRAVENOUS

## 2023-08-07 MED ORDER — BUPIVACAINE LIPOSOME 1.3 % IJ SUSP
INTRAMUSCULAR | Status: AC
  Filled 2023-08-07: qty 20

## 2023-08-07 MED ORDER — ROCURONIUM BROMIDE 100 MG/10ML IV SOLN
INTRAVENOUS | Status: DC | PRN
  Administered 2023-08-07: 50 mg via INTRAVENOUS

## 2023-08-07 MED ORDER — FENTANYL CITRATE PF 50 MCG/ML IJ SOSY
25.0000 ug | PREFILLED_SYRINGE | INTRAMUSCULAR | Status: DC | PRN
  Administered 2023-08-07: 25 ug via INTRAVENOUS

## 2023-08-07 MED ORDER — EPHEDRINE 5 MG/ML INJ
INTRAVENOUS | Status: AC
  Filled 2023-08-07: qty 5

## 2023-08-07 SURGICAL SUPPLY — 37 items
ADH SKN CLS APL DERMABOND .7 (GAUZE/BANDAGES/DRESSINGS) ×1
APL PRP STRL LF DISP 70% ISPRP (MISCELLANEOUS) ×1
BAG COUNTER SPONGE SURGICOUNT (BAG) ×2 IMPLANT
BAG SPNG CNTER NS LX DISP (BAG) ×1
CABLE HIGH FREQUENCY MONO STRZ (ELECTRODE) ×2 IMPLANT
CHLORAPREP W/TINT 26 (MISCELLANEOUS) ×2 IMPLANT
DERMABOND ADVANCED .7 DNX12 (GAUZE/BANDAGES/DRESSINGS) ×2 IMPLANT
ELECT REM PT RETURN 15FT ADLT (MISCELLANEOUS) ×2 IMPLANT
GLOVE BIO SURGEON STRL SZ7.5 (GLOVE) ×2 IMPLANT
GLOVE INDICATOR 8.0 STRL GRN (GLOVE) ×2 IMPLANT
GOWN STRL REUS W/ TWL XL LVL3 (GOWN DISPOSABLE) ×4 IMPLANT
GOWN STRL REUS W/TWL XL LVL3 (GOWN DISPOSABLE) ×2
GRASPER SUT TROCAR 14GX15 (MISCELLANEOUS) ×2 IMPLANT
IRRIG SUCT STRYKERFLOW 2 WTIP (MISCELLANEOUS)
IRRIGATION SUCT STRKRFLW 2 WTP (MISCELLANEOUS) IMPLANT
KIT BASIN OR (CUSTOM PROCEDURE TRAY) ×2 IMPLANT
KIT TURNOVER KIT A (KITS) IMPLANT
MARKER SKIN DUAL TIP RULER LAB (MISCELLANEOUS) ×2 IMPLANT
MESH 3DMAX 5X7 RT XLRG (Mesh General) IMPLANT
NDL INSUFFLATION 14GA 120MM (NEEDLE) ×4 IMPLANT
NEEDLE INSUFFLATION 14GA 120MM (NEEDLE) ×2 IMPLANT
RELOAD STAPLE 4.0 BLU F/HERNIA (INSTRUMENTS) IMPLANT
RELOAD STAPLE 4.8 BLK F/HERNIA (STAPLE) ×2 IMPLANT
RELOAD STAPLE HERNIA 4.0 BLUE (INSTRUMENTS) IMPLANT
RELOAD STAPLE HERNIA 4.8 BLK (STAPLE) ×1 IMPLANT
SCISSORS LAP 5X35 DISP (ENDOMECHANICALS) ×2 IMPLANT
SET TUBE SMOKE EVAC HIGH FLOW (TUBING) ×2 IMPLANT
SPIKE FLUID TRANSFER (MISCELLANEOUS) IMPLANT
STAPLER HERNIA 12 8.5 360D (INSTRUMENTS) ×2 IMPLANT
SUT MNCRL AB 4-0 PS2 18 (SUTURE) ×2 IMPLANT
SUT VICRYL 0 UR6 27IN ABS (SUTURE) IMPLANT
TOWEL OR 17X26 10 PK STRL BLUE (TOWEL DISPOSABLE) ×2 IMPLANT
TRAY FOL W/BAG SLVR 16FR STRL (SET/KITS/TRAYS/PACK) ×2 IMPLANT
TRAY FOLEY W/BAG SLVR 16FR LF (SET/KITS/TRAYS/PACK) ×1
TRAY LAPAROSCOPIC (CUSTOM PROCEDURE TRAY) ×2 IMPLANT
TROCAR ADV FIXATION 12X100MM (TROCAR) ×2 IMPLANT
TROCAR Z-THREAD OPTICAL 5X100M (TROCAR) ×4 IMPLANT

## 2023-08-07 NOTE — Anesthesia Procedure Notes (Signed)
Procedure Name: Intubation Date/Time: 08/07/2023 9:06 AM  Performed by: Ahmed Prima, CRNAPre-anesthesia Checklist: Patient identified, Emergency Drugs available, Suction available and Patient being monitored Patient Re-evaluated:Patient Re-evaluated prior to induction Oxygen Delivery Method: Circle system utilized Preoxygenation: Pre-oxygenation with 100% oxygen Induction Type: IV induction Ventilation: Mask ventilation without difficulty Laryngoscope Size: Miller and 2 Grade View: Grade II Tube type: Oral Tube size: 7.0 mm Number of attempts: 1 Airway Equipment and Method: Stylet and Oral airway Placement Confirmation: ETT inserted through vocal cords under direct vision, positive ETCO2 and breath sounds checked- equal and bilateral Secured at: 22 cm Tube secured with: Tape Dental Injury: Teeth and Oropharynx as per pre-operative assessment

## 2023-08-07 NOTE — H&P (Signed)
Admitting Physician: Hyman Hopes Shaneika Rossa  Service: General surgery  CC: hernia  Subjective   HPI: Joanne Morrison is an 72 y.o. female who is here for hernia repair.  Past Medical History:  Diagnosis Date   Anxiety    Arthritis    back   Chronic cystitis    Chronic low back pain    Depression    Diverticulosis of colon    GBS (Guillain-Barre syndrome) (HCC)    Hemorrhoids    History of colon polyps    09-24-2009  rectal hyperplastic polyp/   and 2016 non-cancerous   History of endometriosis    History of panic attacks    Hyperlipidemia    Hypertension    Osteopenia    Pelvic pain    Sensation of pressure in bladder area    Wears glasses     Past Surgical History:  Procedure Laterality Date   BUNIONECTOMY Left 2004 approx   CARPAL TUNNEL RELEASE Bilateral right 2014;  left 2007   COLONOSCOPY W/ POLYPECTOMY  09/24/2009   CYSTO WITH HYDRODISTENSION [name] 10/15/2016   Procedure: CYSTOSCOPY/HYDRODISTENSION AND MARCAINE INSTILLATION;  Surgeon: Barron Alvine, MD;  Location: [facility];  Service: Urology;  Laterality: [name];   CYSTO/  HYDRODISTENTION/  INSTILLATION THERAPY  1997 approx   HYSTEROSCOPY WITH D & C  11/01/2004   POLYPECTOMY   IR GASTROSTOMY TUBE REMOVAL  03/20/2017   IR GENERIC HISTORICAL  01/26/2017   IR GASTROSTOMY TUBE MOD SED 01/26/2017 Simonne Come, MD [facility]   LAPAROSCOPIC VAGINAL HYSTERECTOMY WITH SALPINGO OOPHORECTOMY Bilateral 04/20/2012   TUBAL LIGATION  yrs ago    Family History  Problem Relation Age of Onset   Hypertension Mother    Dementia Mother    Lung cancer Mother        smoker   Osteoporosis Mother    Heart attack Father 62   Hypertension Brother    Pulmonary embolism Brother 34       ?etilology   Diabetes Maternal Aunt    Cancer Maternal Uncle        RENAL   Heart attack Maternal Grandmother 86   Lung cancer Maternal Grandfather        coal miner   Anesthesia problems Neg Hx    Stroke Neg Hx     Social:   reports that she has never smoked. She has never used smokeless tobacco. She reports current drug use. Drug: Other-see comments. She reports that she does not drink alcohol.  Allergies:  Allergies  Allergen Reactions   Zyprexa [Olanzapine] Other (See Comments)    STIFF JOINTS, EXTREMITY WEAKNESS, MEMORY LOSS, LOSS OF BALANCE   Imitrex [Sumatriptan] Other (See Comments)    REACTION: altered mental status   Neurontin [Gabapentin]     Causes confusion and extreme drowsiness    Ustell [Meth-Hyo-M Bl-Na Phos-Ph Sal] Other (See Comments)    lethargic   Ativan [Lorazepam] Other (See Comments)    "Head felt funny"    Medications: Current Outpatient Medications  Medication Instructions   B Complex-C (SUPER B COMPLEX PO) 1 capsule, Oral, Every morning   baclofen (LIORESAL) 10 mg, Oral, Daily PRN   CANNABIDIOL PO 1 tablet, Oral, At bedtime PRN, CBD Gummy   Cholecalciferol (VITAMIN D-3 PO) 1 tablet, Oral, Every morning   escitalopram (LEXAPRO) 10 mg, Oral, Every morning   Flaxseed, Linseed, (FLAXSEED OIL PO) 1 capsule, Oral, Every morning   irbesartan (AVAPRO) 75 mg, Oral, Daily at bedtime   meloxicam (MOBIC)  15 mg, Oral, Every morning   misoprostol (CYTOTEC) 200 mcg, Oral, Every morning   Multiple Vitamin (MULTIVITAMIN WITH MINERALS) TABS tablet 1 tablet, Oral, Every morning   ondansetron (ZOFRAN-ODT) 4 mg, Oral, Every 8 hours PRN   oxyCODONE-acetaminophen (PERCOCET/ROXICET) 5-325 MG tablet 1 tablet, Oral, 3 times daily PRN   propranolol (INDERAL) 10 mg, Oral, 2 times daily   Propylene Glycol (SYSTANE COMPLETE) 0.6 % SOLN 1-2 drops, Both Eyes, 2 times daily PRN   rosuvastatin (CRESTOR) 10 mg, Oral, Every morning    ROS - all of the below systems have been reviewed with the patient and positives are indicated with bold text General: chills, fever or night sweats Eyes: blurry vision or double vision ENT: epistaxis or sore throat Allergy/Immunology: itchy/watery eyes or nasal  congestion Hematologic/Lymphatic: bleeding problems, blood clots or swollen lymph nodes Endocrine: temperature intolerance or unexpected weight changes Breast: new or changing breast lumps or nipple discharge Resp: cough, shortness of breath, or wheezing CV: chest pain or dyspnea on exertion GI: as per HPI GU: dysuria, trouble voiding, or hematuria MSK: joint pain or joint stiffness Neuro: TIA or stroke symptoms Derm: pruritus and skin lesion changes Psych: anxiety and depression  Objective   PE There were no vitals taken for this visit. Constitutional: NAD; conversant; no deformities Eyes: Moist conjunctiva; no lid lag; anicteric; PERRL Neck: Trachea midline; no thyromegaly Lungs: Normal respiratory effort; no tactile fremitus CV: RRR; no palpable thrills; no pitting edema GI: Abd right inguinal hernia; no palpable hepatosplenomegaly MSK: Normal range of motion of extremities; no clubbing/cyanosis Psychiatric: Appropriate affect; alert and oriented x3 Lymphatic: No palpable cervical or axillary lymphadenopathy  No results found for this or any previous visit (from the past 24 hour(s)).  Imaging Orders  No imaging studies ordered today  CT Abd/Pel 03/28/22:  1. Small fat containing right inguinal hernia. No bowel herniation. 2. Distal colonic diverticulosis without diverticulitis. 3. Lumbar spondylosis and facet hypertrophy, with right convex scoliosis. 4. Aortic Atherosclerosis (ICD10-I70.0).    Assessment and Plan    [name] has a right inguinal hernia.  I recommended laparoscopic right inguinal hernia repair with mesh. We discussed the procedure itself as well as its risk, benefits, and alternatives. After full discussion all questions answered the patient granted consent to proceed.  History of Guillain-Barr syndrome and required a tracheostomy. I assume she will be a more difficult intubation.   Quentin Ore, MD  [facility] Use  [email or web address] to contact on call provider

## 2023-08-07 NOTE — Discharge Instructions (Signed)
 GROIN HERNIA REPAIR POST OPERATIVE INSTRUCTIONS  Thinking Clearly  The anesthesia may cause you to feel different for 1 or 2 days. Do not drive, drink alcohol, or make any big decisions for at least 2 days.  Nutrition When you wake up, you will be able to drink small amounts of liquid. If you do not feel sick, you can slowly advance your diet to regular foods. Continue to drink lots of fluids, usually about 8 to 10 glasses per day. Eat a high-fiber diet so you don't strain during bowel movements. High-Fiber Foods Foods high in fiber include beans, bran cereals and whole-grain breads, peas, dried fruit (figs, apricots, and dates), raspberries, blackberries, strawberries, sweet corn, broccoli, baked potatoes with skin, plums, pears, apples, greens, and nuts. Activity Slowly increase your activity. Be sure to get up and walk every hour or so to prevent blood clots. No heavy lifting or strenuous activity for 4 weeks following surgery to prevent hernias at your incision sites or recurrence of your hernia. It is normal to feel tired. You may need more sleep than usual.  Get your rest but make sure to get up and move around frequently to prevent blood clots and pneumonia.  Work and Return to School You can go back to work when you feel well enough. Discuss the timing with your surgeon. You can usually go back to school or work 1 week or less after an laparoscopic or an open repair. If your work requires heavy lifting or strenuous activity you need to be placed on light duty for 4 weeks following surgery. You can return to gym class, sports or other physical activities 4 weeks after surgery.  Wound Care You may experience significant bruising in the groin including into the scrotum in males.  Rest, elevating the groin and scrotum above the level of the heart, ice and compression with tight fitting underwear can help.  Always wash your hands before and after touching near your incision site. Do  not soak in a bathtub until cleared at your follow up appointment. You may take a shower 24 hours after surgery. A small amount of drainage from the incision is normal. If the drainage is thick and yellow or the site is red, you may have an infection, so call your surgeon. If you have a drain in one of your incisions, it will be taken out in office when the drainage stops. Steri-Strips will fall off in 7 to 10 days or they will be removed during your first office visit. If you have dermabond glue covering over the incision, allow the glue to flake off on its own. Protect the new skin, especially from the sun. The sun can burn and cause darker scarring. Your scar will heal in about 4 to 6 weeks and will become softer and continue to fade over the next year.  The cosmetic appearance of the incisions will improve over the course of the first year after surgery. Sensation around your incision will return in a few weeks or months.  Bowel Movements After intestinal surgery, you may have loose watery stools for several days. If watery diarrhea lasts longer than 3 days, contact your surgeon. Pain medication (narcotics) can cause constipation. Increase the fiber in your diet with high-fiber foods if you are constipated. You can take an over the counter stool softener like Colace to avoid constipation.  Additional over the counter medications can also be used if Colace isn't sufficient (for example, Milk of Magnesia or Miralax).    Pain The amount of pain is different for each person. Some people need only 1 to 3 doses of pain control medication, while others need more. Take alternating doses of tylenol and ibuprofen around the clock for the first five days following surgery.  This will provide a baseline of pain control and help with inflammation.  Take the narcotic pain medication in addition if needed for severe pain.  Contact Your Surgeon at [phone], if you have: Pain that will not go away Pain that  gets worse A fever of more than 101F (38.3C) Repeated vomiting Swelling, redness, bleeding, or bad-smelling drainage from your wound site Strong abdominal pain No bowel movement or unable to pass gas for 3 days Watery diarrhea lasting longer than 3 days  Pain Control The goal of pain control is to minimize pain, keep you moving and help you heal. Your surgical team will work with you on your pain plan. Most often a combination of therapies and medications are used to control your pain. You may also be given medication (local anesthetic) at the surgical site. This may help control your pain for several days. Extreme pain puts extra stress on your body at a time when your body needs to focus on healing. Do not wait until your pain has reached a level "10" or is unbearable before telling your doctor or nurse. It is much easier to control pain before it becomes severe. Following a laparoscopic procedure, pain is sometimes felt in the shoulder. This is due to the gas inserted into your abdomen during the procedure. Moving and walking helps to decrease the gas and the right shoulder pain.  Use the guide below for ways to manage your post-operative pain. Learn more by going to facs.org/safepaincontrol.  How Intense Is My Pain Common Therapies to Feel Better       I hardly notice my pain, and it does not interfere with my activities.  I notice my pain and it distracts me, but I can still do activities (sitting up, walking, standing).  Non-Medication Therapies  Ice (in a bag, applied over clothing at the surgical site), elevation, rest, meditation, massage, distraction (music, TV, play) walking and mild exercise Splinting the abdomen with pillows +  Non-Opioid Medications Acetaminophen (Tylenol) Non-steroidal anti-inflammatory drugs (NSAIDS) Aspirin, Ibuprofen (Motrin, Advil) Naproxen (Aleve) Take these as needed, when you feel pain. Both acetaminophen and NSAIDs help to decrease pain  and swelling (inflammation).      My pain is hard to ignore and is more noticeable even when I rest.  My pain interferes with my usual activities.  Non-Medication Therapies  +  Non-Opioid medications  Take on a regular schedule (around-the-clock) instead of as needed. (For example, Tylenol every 6 hours at 9:00 am, 3:00 pm, 9:00 pm, 3:00 am and Motrin every 6 hours at 12:00 am, 6:00 am, 12:00 pm, 6:00 pm)         I am focused on my pain, and I am not doing my daily activities.  I am groaning in pain, and I cannot sleep. I am unable to do anything.  My pain is as bad as it could be, and nothing else matters.  Non-Medication Therapies  +  Around-the-Clock Non-Opioid Medications  +  Short-acting opioids  Opioids should be used with other medications to manage severe pain. Opioids block pain and give a feeling of euphoria (feel high). Addiction, a serious side effect of opioids, is rare with short-term (a few days) use.  Examples of short-acting opioids   include: Tramadol (Ultram), Hydrocodone (Norco, Vicodin), Hydromorphone (Dilaudid), Oxycodone (Oxycontin)     The above directions have been adapted from the American College [employer] Surgical Patient Education Program.  Please refer to the ACS website if needed: [email or web address]://[email or web address]/-/media/[email or web address]   Paul Stechschulte, MD [facility] [address], [address], [place], [place]  [address] ?  [address] [address], [place], [place]   [address] [phone] ? [phone] ? FAX [phone] Web site: [email or web address]  

## 2023-08-07 NOTE — Op Note (Signed)
   Patient: Joanne Morrison (30-Jan-1951, [id number])  Date of Surgery: 08/07/2023   Preoperative Diagnosis: @T @  Postoperative Diagnosis: RIGHT INGUINAL HERNIA   Surgical Procedure: LAPAROSCOPIC RIGHT INGUINAL HERNIA REPAIR WITH MESH: [id number]   Operative Team Members:  Surgeons and Role:    * Petra Sargeant, Hyman Hopes, MD - Primary   Anesthesiologist: Achille Rich, MD CRNA: Ahmed Prima, CRNA   Anesthesia: General   Fluids:  Total I/O In: 550 [I.V.:450; IV Piggyback:100] Out: -   Complications: None  Drains:  None  Specimen: None  Disposition:  PACU - hemodynamically stable.  Plan of Care: Discharge to home after PACU  Indications for Procedure: Joanne Morrison is a 72 y.o. female who presented with a right inguinal hernia.  I recommended laparoscopic repair.  We discussed the procedure, its risks, benefits and alterantives.  After a full discussion and all questions answered the patient granted consent to proceed.  Findings:  Technique: Transabdominal preperitoneal (TAPP) Hernia Location: Right indirect inguinal hernia Mesh Size &Type:  Bard 3D max extra large right sided mesh Mesh Fixation: Endo-Universal hernia stapler  Infection status: Patient: Private Patient Elective Case Case: Urgent Infection Present At Time Of Surgery (PATOS): None  Description of Procedure:  The patient was positioned supine, padded and secured to the bed, with both arms tucked.  The abdomen was widely prepped and draped.  A time out procedure was performed.  A 1 cm infraumbilical incision was made.  The abdomen was entered utilizing a Veress needle at the umbilical stalk, using a kocher clamp to elevate the umbilical stalk.  The abdomen was insufflated to 15 mm of Hg.  A blunt 12 mm trocar was inserted using the optical techinque.  Additional 5 mm trocars were placed in the left and right abdomen.  There was an direct hernia on the RIGHT.  Utilizing a transabdominal pre peritoneal  technique (TAPP), a horizontal incision was made in the peritoneum, immediately below the umbilicus.  Dissection was carried out in the pre peritoneal space down to the level of the hernia sac which was reduced into the peritoneal cavity completely.  The round ligament was identified and divided utilizing cautery.  A large pre peritoneal dissection was performed to uncover the direct, indirect, femoral and obturator spaces.  Cooper's ligament was uncovered medially and the psoas muscle uncovered laterally.  The mesh, as documented above, was opened and advanced into the pre peritoneal position so that it more than adequately covered the indirect, direct, femoral and obturator spaces.  The mesh laid flat, with no inferior folds and covered the entire myopectineal orifice.  The mesh was fixated with the endo-universal hernia stapler to Cooper's ligament and the posterior aspect of the rectus muscle.  The peritoneal flap was closed with the same device.  There were no peritoneal defects or exposed mesh at the conclusion.  The umbilical trocar was removed and the fascial defect was closed with a figure of eight 0 Vicryl suture, utilizing a suture passer.  The peritoneal cavity was completely desufflated, the trocars removed and the skin closed with 4-0 Monocryl subcuticular suture and skin glue.  All sponge and needle counts were correct at the end of the case.  Ivar Drape, MD General, Bariatric, & Minimally Invasive Surgery [facility], [place]

## 2023-08-07 NOTE — Transfer of Care (Signed)
Immediate Anesthesia Transfer of Care Note  Patient: Joanne Morrison  Procedure(s) Performed: LAPAROSCOPIC RIGHT INGUINAL HERNIA REPAIR WITH MESH (Right)  Patient Location: PACU  Anesthesia Type:General  Level of Consciousness: awake  Airway & Oxygen Therapy: Patient Spontanous Breathing and Patient connected to face mask oxygen  Post-op Assessment: Report given to RN and Post -op Vital signs reviewed and stable  Post vital signs: Reviewed and stable  Last Vitals:  Vitals Value Taken Time  BP 177/89 08/07/23 0955  Temp    Pulse 59 08/07/23 0956  Resp 15 08/07/23 0956  SpO2 100% 08/07/23 0956  Vitals shown include unfiled device data.  Last Pain:  Vitals:   08/07/23 0742  TempSrc:   PainSc: 0-No pain      Patients Stated Pain Goal: 3 (08/07/23 0742)  Complications: No notable events documented.

## 2023-08-07 NOTE — Anesthesia Preprocedure Evaluation (Signed)
Anesthesia Evaluation  Patient identified by MRN, date of birth, ID band Patient awake    Reviewed: Allergy & Precautions, H&P , NPO status , Patient's Chart, lab work & pertinent test results  Airway Mallampati: II   Neck ROM: full    Dental   Pulmonary neg pulmonary ROS   breath sounds clear to auscultation       Cardiovascular hypertension,  Rhythm:regular Rate:Normal     Neuro/Psych  Headaches PSYCHIATRIC DISORDERS Anxiety Depression       GI/Hepatic   Endo/Other    Renal/GU      Musculoskeletal  (+) Arthritis ,    Abdominal   Peds  Hematology   Anesthesia Other Findings   Reproductive/Obstetrics                             Anesthesia Physical Anesthesia Plan  ASA: 2  Anesthesia Plan: General   Post-op Pain Management:    Induction: Intravenous  PONV Risk Score and Plan: 3 and Ondansetron, Dexamethasone, Midazolam and Treatment may vary due to age or medical condition  Airway Management Planned: Oral ETT  Additional Equipment:   Intra-op Plan:   Post-operative Plan: Extubation in OR  Informed Consent: I have reviewed the patients History and Physical, chart, labs and discussed the procedure including the risks, benefits and alternatives for the proposed anesthesia with the patient or authorized representative who has indicated his/her understanding and acceptance.     Dental advisory given  Plan Discussed with: CRNA, Anesthesiologist and Surgeon  Anesthesia Plan Comments:        Anesthesia Quick Evaluation

## 2023-08-10 ENCOUNTER — Encounter (HOSPITAL_COMMUNITY): Payer: Self-pay | Admitting: Surgery

## 2023-08-10 NOTE — Anesthesia Postprocedure Evaluation (Signed)
Anesthesia Post Note  Patient: Joanne Morrison  Procedure(s) Performed: LAPAROSCOPIC RIGHT INGUINAL HERNIA REPAIR WITH MESH (Right)     Patient location during evaluation: PACU Anesthesia Type: General Level of consciousness: awake and alert Pain management: pain level controlled Vital Signs Assessment: post-procedure vital signs reviewed and stable Respiratory status: spontaneous breathing, nonlabored ventilation, respiratory function stable and patient connected to nasal cannula oxygen Cardiovascular status: blood pressure returned to baseline and stable Postop Assessment: no apparent nausea or vomiting Anesthetic complications: no   No notable events documented.  Last Vitals:  Vitals:   08/07/23 1115 08/07/23 1137  BP: (!) 156/83 (!) 154/92  Pulse: 71 70  Resp: 14 14  Temp:  36.9 C  SpO2: 96% 98%    Last Pain:  Vitals:   08/07/23 1137  TempSrc: Oral  PainSc: 0-No pain                 Synethia Endicott S

## 2023-09-07 DIAGNOSIS — G894 Chronic pain syndrome: Secondary | ICD-10-CM | POA: Diagnosis not present

## 2023-09-07 DIAGNOSIS — Z79891 Long term (current) use of opiate analgesic: Secondary | ICD-10-CM | POA: Diagnosis not present

## 2023-09-07 DIAGNOSIS — M47816 Spondylosis without myelopathy or radiculopathy, lumbar region: Secondary | ICD-10-CM | POA: Diagnosis not present

## 2023-09-07 DIAGNOSIS — M4316 Spondylolisthesis, lumbar region: Secondary | ICD-10-CM | POA: Diagnosis not present

## 2023-09-08 DIAGNOSIS — G894 Chronic pain syndrome: Secondary | ICD-10-CM | POA: Diagnosis not present

## 2023-09-08 DIAGNOSIS — Z79891 Long term (current) use of opiate analgesic: Secondary | ICD-10-CM | POA: Diagnosis not present

## 2023-09-14 DIAGNOSIS — H524 Presbyopia: Secondary | ICD-10-CM | POA: Diagnosis not present

## 2023-09-14 DIAGNOSIS — H04123 Dry eye syndrome of bilateral lacrimal glands: Secondary | ICD-10-CM | POA: Diagnosis not present

## 2023-09-14 DIAGNOSIS — H2513 Age-related nuclear cataract, bilateral: Secondary | ICD-10-CM | POA: Diagnosis not present

## 2023-11-03 DIAGNOSIS — Z79891 Long term (current) use of opiate analgesic: Secondary | ICD-10-CM | POA: Diagnosis not present

## 2023-11-03 DIAGNOSIS — M47816 Spondylosis without myelopathy or radiculopathy, lumbar region: Secondary | ICD-10-CM | POA: Diagnosis not present

## 2023-11-03 DIAGNOSIS — G894 Chronic pain syndrome: Secondary | ICD-10-CM | POA: Diagnosis not present

## 2023-11-03 DIAGNOSIS — M4316 Spondylolisthesis, lumbar region: Secondary | ICD-10-CM | POA: Diagnosis not present

## 2023-11-10 DIAGNOSIS — M7121 Synovial cyst of popliteal space [Baker], right knee: Secondary | ICD-10-CM | POA: Diagnosis not present

## 2023-12-11 DIAGNOSIS — R102 Pelvic and perineal pain: Secondary | ICD-10-CM | POA: Diagnosis not present

## 2023-12-11 DIAGNOSIS — R339 Retention of urine, unspecified: Secondary | ICD-10-CM | POA: Diagnosis not present

## 2023-12-11 DIAGNOSIS — N390 Urinary tract infection, site not specified: Secondary | ICD-10-CM | POA: Diagnosis not present

## 2023-12-29 DIAGNOSIS — G894 Chronic pain syndrome: Secondary | ICD-10-CM | POA: Diagnosis not present

## 2023-12-29 DIAGNOSIS — M4316 Spondylolisthesis, lumbar region: Secondary | ICD-10-CM | POA: Diagnosis not present

## 2023-12-29 DIAGNOSIS — Z79891 Long term (current) use of opiate analgesic: Secondary | ICD-10-CM | POA: Diagnosis not present

## 2023-12-29 DIAGNOSIS — M47816 Spondylosis without myelopathy or radiculopathy, lumbar region: Secondary | ICD-10-CM | POA: Diagnosis not present

## 2024-01-25 DIAGNOSIS — R61 Generalized hyperhidrosis: Secondary | ICD-10-CM | POA: Diagnosis not present

## 2024-01-25 DIAGNOSIS — Z01419 Encounter for gynecological examination (general) (routine) without abnormal findings: Secondary | ICD-10-CM | POA: Diagnosis not present

## 2024-01-25 DIAGNOSIS — Z1231 Encounter for screening mammogram for malignant neoplasm of breast: Secondary | ICD-10-CM | POA: Diagnosis not present

## 2024-01-25 DIAGNOSIS — Z6830 Body mass index (BMI) 30.0-30.9, adult: Secondary | ICD-10-CM | POA: Diagnosis not present

## 2024-02-23 DIAGNOSIS — M47816 Spondylosis without myelopathy or radiculopathy, lumbar region: Secondary | ICD-10-CM | POA: Diagnosis not present

## 2024-02-23 DIAGNOSIS — G894 Chronic pain syndrome: Secondary | ICD-10-CM | POA: Diagnosis not present

## 2024-02-23 DIAGNOSIS — Z79891 Long term (current) use of opiate analgesic: Secondary | ICD-10-CM | POA: Diagnosis not present

## 2024-02-23 DIAGNOSIS — M4316 Spondylolisthesis, lumbar region: Secondary | ICD-10-CM | POA: Diagnosis not present

## 2024-04-21 DIAGNOSIS — M4316 Spondylolisthesis, lumbar region: Secondary | ICD-10-CM | POA: Diagnosis not present

## 2024-04-21 DIAGNOSIS — Z79891 Long term (current) use of opiate analgesic: Secondary | ICD-10-CM | POA: Diagnosis not present

## 2024-04-21 DIAGNOSIS — M47816 Spondylosis without myelopathy or radiculopathy, lumbar region: Secondary | ICD-10-CM | POA: Diagnosis not present

## 2024-04-21 DIAGNOSIS — G894 Chronic pain syndrome: Secondary | ICD-10-CM | POA: Diagnosis not present

## 2024-06-14 DIAGNOSIS — M7121 Synovial cyst of popliteal space [Baker], right knee: Secondary | ICD-10-CM | POA: Diagnosis not present

## 2024-06-16 DIAGNOSIS — G894 Chronic pain syndrome: Secondary | ICD-10-CM | POA: Diagnosis not present

## 2024-06-16 DIAGNOSIS — M47816 Spondylosis without myelopathy or radiculopathy, lumbar region: Secondary | ICD-10-CM | POA: Diagnosis not present

## 2024-06-16 DIAGNOSIS — Z79891 Long term (current) use of opiate analgesic: Secondary | ICD-10-CM | POA: Diagnosis not present

## 2024-06-16 DIAGNOSIS — M4316 Spondylolisthesis, lumbar region: Secondary | ICD-10-CM | POA: Diagnosis not present

## 2024-06-20 DIAGNOSIS — M25561 Pain in right knee: Secondary | ICD-10-CM | POA: Diagnosis not present

## 2024-06-30 DIAGNOSIS — M8580 Other specified disorders of bone density and structure, unspecified site: Secondary | ICD-10-CM | POA: Diagnosis not present

## 2024-06-30 DIAGNOSIS — E785 Hyperlipidemia, unspecified: Secondary | ICD-10-CM | POA: Diagnosis not present

## 2024-06-30 DIAGNOSIS — Z1212 Encounter for screening for malignant neoplasm of rectum: Secondary | ICD-10-CM | POA: Diagnosis not present

## 2024-06-30 DIAGNOSIS — E7849 Other hyperlipidemia: Secondary | ICD-10-CM | POA: Diagnosis not present

## 2024-06-30 DIAGNOSIS — I10 Essential (primary) hypertension: Secondary | ICD-10-CM | POA: Diagnosis not present

## 2024-07-07 DIAGNOSIS — Z0000 Encounter for general adult medical examination without abnormal findings: Secondary | ICD-10-CM | POA: Diagnosis not present

## 2024-07-07 DIAGNOSIS — M1990 Unspecified osteoarthritis, unspecified site: Secondary | ICD-10-CM | POA: Diagnosis not present

## 2024-07-07 DIAGNOSIS — F329 Major depressive disorder, single episode, unspecified: Secondary | ICD-10-CM | POA: Diagnosis not present

## 2024-07-07 DIAGNOSIS — D126 Benign neoplasm of colon, unspecified: Secondary | ICD-10-CM | POA: Diagnosis not present

## 2024-07-07 DIAGNOSIS — F419 Anxiety disorder, unspecified: Secondary | ICD-10-CM | POA: Diagnosis not present

## 2024-07-07 DIAGNOSIS — M8580 Other specified disorders of bone density and structure, unspecified site: Secondary | ICD-10-CM | POA: Diagnosis not present

## 2024-07-07 DIAGNOSIS — G4700 Insomnia, unspecified: Secondary | ICD-10-CM | POA: Diagnosis not present

## 2024-07-07 DIAGNOSIS — R82998 Other abnormal findings in urine: Secondary | ICD-10-CM | POA: Diagnosis not present

## 2024-07-07 DIAGNOSIS — E785 Hyperlipidemia, unspecified: Secondary | ICD-10-CM | POA: Diagnosis not present

## 2024-07-07 DIAGNOSIS — I700 Atherosclerosis of aorta: Secondary | ICD-10-CM | POA: Diagnosis not present

## 2024-07-07 DIAGNOSIS — H539 Unspecified visual disturbance: Secondary | ICD-10-CM | POA: Diagnosis not present

## 2024-07-07 DIAGNOSIS — I10 Essential (primary) hypertension: Secondary | ICD-10-CM | POA: Diagnosis not present

## 2024-07-07 DIAGNOSIS — I2510 Atherosclerotic heart disease of native coronary artery without angina pectoris: Secondary | ICD-10-CM | POA: Diagnosis not present

## 2024-07-12 DIAGNOSIS — M7121 Synovial cyst of popliteal space [Baker], right knee: Secondary | ICD-10-CM | POA: Diagnosis not present

## 2024-08-11 DIAGNOSIS — G894 Chronic pain syndrome: Secondary | ICD-10-CM | POA: Diagnosis not present

## 2024-08-11 DIAGNOSIS — M4316 Spondylolisthesis, lumbar region: Secondary | ICD-10-CM | POA: Diagnosis not present

## 2024-08-11 DIAGNOSIS — M47816 Spondylosis without myelopathy or radiculopathy, lumbar region: Secondary | ICD-10-CM | POA: Diagnosis not present

## 2024-08-11 DIAGNOSIS — Z79891 Long term (current) use of opiate analgesic: Secondary | ICD-10-CM | POA: Diagnosis not present

## 2024-09-06 DIAGNOSIS — H539 Unspecified visual disturbance: Secondary | ICD-10-CM | POA: Diagnosis not present

## 2024-09-06 DIAGNOSIS — R519 Headache, unspecified: Secondary | ICD-10-CM | POA: Diagnosis not present

## 2024-09-14 DIAGNOSIS — R519 Headache, unspecified: Secondary | ICD-10-CM | POA: Diagnosis not present

## 2024-09-14 DIAGNOSIS — H538 Other visual disturbances: Secondary | ICD-10-CM | POA: Diagnosis not present

## 2024-09-14 DIAGNOSIS — H539 Unspecified visual disturbance: Secondary | ICD-10-CM | POA: Diagnosis not present

## 2024-09-15 DIAGNOSIS — H524 Presbyopia: Secondary | ICD-10-CM | POA: Diagnosis not present

## 2024-09-15 DIAGNOSIS — H2513 Age-related nuclear cataract, bilateral: Secondary | ICD-10-CM | POA: Diagnosis not present

## 2024-09-15 DIAGNOSIS — H04123 Dry eye syndrome of bilateral lacrimal glands: Secondary | ICD-10-CM | POA: Diagnosis not present

## 2024-10-06 DIAGNOSIS — G894 Chronic pain syndrome: Secondary | ICD-10-CM | POA: Diagnosis not present

## 2024-10-06 DIAGNOSIS — Z79891 Long term (current) use of opiate analgesic: Secondary | ICD-10-CM | POA: Diagnosis not present

## 2024-10-06 DIAGNOSIS — M4316 Spondylolisthesis, lumbar region: Secondary | ICD-10-CM | POA: Diagnosis not present

## 2024-10-06 DIAGNOSIS — M47816 Spondylosis without myelopathy or radiculopathy, lumbar region: Secondary | ICD-10-CM | POA: Diagnosis not present

## 2024-10-18 DIAGNOSIS — G43109 Migraine with aura, not intractable, without status migrainosus: Secondary | ICD-10-CM | POA: Diagnosis not present

## 2024-10-18 DIAGNOSIS — H539 Unspecified visual disturbance: Secondary | ICD-10-CM | POA: Diagnosis not present

## 2024-12-01 DIAGNOSIS — Z79891 Long term (current) use of opiate analgesic: Secondary | ICD-10-CM | POA: Diagnosis not present

## 2024-12-01 DIAGNOSIS — M4316 Spondylolisthesis, lumbar region: Secondary | ICD-10-CM | POA: Diagnosis not present

## 2024-12-01 DIAGNOSIS — M47816 Spondylosis without myelopathy or radiculopathy, lumbar region: Secondary | ICD-10-CM | POA: Diagnosis not present

## 2024-12-01 DIAGNOSIS — G894 Chronic pain syndrome: Secondary | ICD-10-CM | POA: Diagnosis not present
